# Patient Record
Sex: Female | Born: 1970 | Race: Black or African American | Hispanic: No | Marital: Married | Smoking: Never smoker
Health system: Southern US, Community
[De-identification: ages and names within clinical notes are randomized; demographics above are authoritative.]

## PROBLEM LIST (undated history)

## (undated) ENCOUNTER — Ambulatory Visit: Admission: EM | Payer: BC Managed Care – PPO | Source: Home / Self Care

## (undated) DIAGNOSIS — E559 Vitamin D deficiency, unspecified: Secondary | ICD-10-CM

## (undated) DIAGNOSIS — D631 Anemia in chronic kidney disease: Secondary | ICD-10-CM

## (undated) DIAGNOSIS — N189 Chronic kidney disease, unspecified: Secondary | ICD-10-CM

## (undated) DIAGNOSIS — Z803 Family history of malignant neoplasm of breast: Secondary | ICD-10-CM

## (undated) DIAGNOSIS — Z8041 Family history of malignant neoplasm of ovary: Secondary | ICD-10-CM

## (undated) DIAGNOSIS — G629 Polyneuropathy, unspecified: Secondary | ICD-10-CM

## (undated) DIAGNOSIS — R002 Palpitations: Secondary | ICD-10-CM

## (undated) DIAGNOSIS — R7401 Elevation of levels of liver transaminase levels: Secondary | ICD-10-CM

## (undated) DIAGNOSIS — I10 Essential (primary) hypertension: Secondary | ICD-10-CM

## (undated) DIAGNOSIS — Z800 Family history of malignant neoplasm of digestive organs: Secondary | ICD-10-CM

## (undated) DIAGNOSIS — M1990 Unspecified osteoarthritis, unspecified site: Secondary | ICD-10-CM

## (undated) DIAGNOSIS — N184 Chronic kidney disease, stage 4 (severe): Secondary | ICD-10-CM

## (undated) DIAGNOSIS — K5900 Constipation, unspecified: Secondary | ICD-10-CM

## (undated) DIAGNOSIS — G4730 Sleep apnea, unspecified: Secondary | ICD-10-CM

## (undated) DIAGNOSIS — K219 Gastro-esophageal reflux disease without esophagitis: Secondary | ICD-10-CM

## (undated) DIAGNOSIS — G4733 Obstructive sleep apnea (adult) (pediatric): Secondary | ICD-10-CM

## (undated) DIAGNOSIS — R079 Chest pain, unspecified: Secondary | ICD-10-CM

## (undated) DIAGNOSIS — M65311 Trigger thumb, right thumb: Secondary | ICD-10-CM

## (undated) DIAGNOSIS — M549 Dorsalgia, unspecified: Secondary | ICD-10-CM

## (undated) DIAGNOSIS — E119 Type 2 diabetes mellitus without complications: Secondary | ICD-10-CM

## (undated) DIAGNOSIS — R600 Localized edema: Secondary | ICD-10-CM

## (undated) DIAGNOSIS — Z9889 Other specified postprocedural states: Secondary | ICD-10-CM

## (undated) DIAGNOSIS — J189 Pneumonia, unspecified organism: Secondary | ICD-10-CM

## (undated) DIAGNOSIS — M2550 Pain in unspecified joint: Secondary | ICD-10-CM

## (undated) DIAGNOSIS — K429 Umbilical hernia without obstruction or gangrene: Secondary | ICD-10-CM

## (undated) DIAGNOSIS — I509 Heart failure, unspecified: Secondary | ICD-10-CM

## (undated) DIAGNOSIS — Z992 Dependence on renal dialysis: Secondary | ICD-10-CM

## (undated) DIAGNOSIS — K760 Fatty (change of) liver, not elsewhere classified: Secondary | ICD-10-CM

## (undated) DIAGNOSIS — R0602 Shortness of breath: Secondary | ICD-10-CM

## (undated) DIAGNOSIS — M1009 Idiopathic gout, multiple sites: Secondary | ICD-10-CM

## (undated) DIAGNOSIS — Z794 Long term (current) use of insulin: Secondary | ICD-10-CM

## (undated) DIAGNOSIS — E11319 Type 2 diabetes mellitus with unspecified diabetic retinopathy without macular edema: Secondary | ICD-10-CM

## (undated) DIAGNOSIS — R87610 Atypical squamous cells of undetermined significance on cytologic smear of cervix (ASC-US): Secondary | ICD-10-CM

## (undated) DIAGNOSIS — E782 Mixed hyperlipidemia: Secondary | ICD-10-CM

## (undated) DIAGNOSIS — R740 Nonspecific elevation of levels of transaminase and lactic acid dehydrogenase [LDH]: Secondary | ICD-10-CM

## (undated) HISTORY — DX: Dorsalgia, unspecified: M54.9

## (undated) HISTORY — DX: Family history of malignant neoplasm of ovary: Z80.41

## (undated) HISTORY — DX: Dependence on renal dialysis: Z99.2

## (undated) HISTORY — PX: DILATION AND CURETTAGE OF UTERUS: SHX78

## (undated) HISTORY — DX: Localized edema: R60.0

## (undated) HISTORY — PX: ABDOMINAL HYSTERECTOMY: SHX81

## (undated) HISTORY — DX: Essential (primary) hypertension: I10

## (undated) HISTORY — DX: Palpitations: R00.2

## (undated) HISTORY — DX: Vitamin D deficiency, unspecified: E55.9

## (undated) HISTORY — DX: Pain in unspecified joint: M25.50

## (undated) HISTORY — DX: Family history of malignant neoplasm of digestive organs: Z80.0

## (undated) HISTORY — PX: CARPAL TUNNEL RELEASE: SHX101

## (undated) HISTORY — PX: OTHER SURGICAL HISTORY: SHX169

## (undated) HISTORY — DX: Family history of malignant neoplasm of breast: Z80.3

## (undated) HISTORY — DX: Heart failure, unspecified: I50.9

## (undated) HISTORY — DX: Chest pain, unspecified: R07.9

## (undated) HISTORY — DX: Constipation, unspecified: K59.00

## (undated) HISTORY — DX: Unspecified osteoarthritis, unspecified site: M19.90

## (undated) HISTORY — DX: Gastro-esophageal reflux disease without esophagitis: K21.9

## (undated) HISTORY — DX: Atypical squamous cells of undetermined significance on cytologic smear of cervix (ASC-US): R87.610

## (undated) HISTORY — DX: Shortness of breath: R06.02

## (undated) HISTORY — PX: BREAST EXCISIONAL BIOPSY: SUR124

---

## 1992-05-17 HISTORY — PX: PELVIC LAPAROSCOPY: SHX162

## 1997-05-21 ENCOUNTER — Encounter: Admission: RE | Admit: 1997-05-21 | Discharge: 1997-05-21 | Payer: Self-pay | Admitting: Obstetrics & Gynecology

## 1998-05-02 ENCOUNTER — Encounter: Payer: Self-pay | Admitting: *Deleted

## 1998-05-02 ENCOUNTER — Inpatient Hospital Stay (HOSPITAL_COMMUNITY): Admission: AD | Admit: 1998-05-02 | Discharge: 1998-05-02 | Payer: Self-pay | Admitting: Family Medicine

## 1998-05-19 ENCOUNTER — Ambulatory Visit (HOSPITAL_COMMUNITY): Admission: RE | Admit: 1998-05-19 | Discharge: 1998-05-19 | Payer: Self-pay | Admitting: Obstetrics & Gynecology

## 1998-05-27 ENCOUNTER — Inpatient Hospital Stay (HOSPITAL_COMMUNITY): Admission: AD | Admit: 1998-05-27 | Discharge: 1998-05-27 | Payer: Self-pay | Admitting: *Deleted

## 1998-05-27 ENCOUNTER — Encounter: Payer: Self-pay | Admitting: *Deleted

## 1998-05-28 ENCOUNTER — Encounter: Admission: RE | Admit: 1998-05-28 | Discharge: 1998-05-28 | Payer: Self-pay | Admitting: Obstetrics & Gynecology

## 1998-05-28 ENCOUNTER — Encounter: Admission: RE | Admit: 1998-05-28 | Discharge: 1998-08-26 | Payer: Self-pay | Admitting: Obstetrics & Gynecology

## 1998-06-18 ENCOUNTER — Encounter: Admission: RE | Admit: 1998-06-18 | Discharge: 1998-06-18 | Payer: Self-pay | Admitting: Obstetrics & Gynecology

## 1998-06-25 ENCOUNTER — Encounter: Admission: RE | Admit: 1998-06-25 | Discharge: 1998-06-25 | Payer: Self-pay | Admitting: Obstetrics & Gynecology

## 1998-07-02 ENCOUNTER — Encounter: Admission: RE | Admit: 1998-07-02 | Discharge: 1998-07-02 | Payer: Self-pay | Admitting: Obstetrics & Gynecology

## 1998-07-02 ENCOUNTER — Encounter: Payer: Self-pay | Admitting: Obstetrics & Gynecology

## 1998-07-02 ENCOUNTER — Ambulatory Visit (HOSPITAL_COMMUNITY): Admission: RE | Admit: 1998-07-02 | Discharge: 1998-07-02 | Payer: Self-pay | Admitting: Obstetrics & Gynecology

## 1998-07-06 ENCOUNTER — Inpatient Hospital Stay (HOSPITAL_COMMUNITY): Admission: AD | Admit: 1998-07-06 | Discharge: 1998-07-06 | Payer: Self-pay | Admitting: Obstetrics & Gynecology

## 1998-07-07 ENCOUNTER — Ambulatory Visit (HOSPITAL_COMMUNITY): Admission: AD | Admit: 1998-07-07 | Discharge: 1998-07-07 | Payer: Self-pay | Admitting: *Deleted

## 1998-08-29 ENCOUNTER — Encounter: Admission: RE | Admit: 1998-08-29 | Discharge: 1998-08-29 | Payer: Self-pay | Admitting: Obstetrics & Gynecology

## 1999-02-26 ENCOUNTER — Encounter: Admission: RE | Admit: 1999-02-26 | Discharge: 1999-05-27 | Payer: Self-pay | Admitting: Gynecology

## 1999-03-03 ENCOUNTER — Encounter (HOSPITAL_COMMUNITY): Admission: RE | Admit: 1999-03-03 | Discharge: 1999-06-01 | Payer: Self-pay | Admitting: *Deleted

## 1999-03-09 ENCOUNTER — Inpatient Hospital Stay (HOSPITAL_COMMUNITY): Admission: AD | Admit: 1999-03-09 | Discharge: 1999-03-09 | Payer: Self-pay | Admitting: Obstetrics & Gynecology

## 1999-03-09 ENCOUNTER — Encounter: Payer: Self-pay | Admitting: *Deleted

## 1999-03-11 ENCOUNTER — Inpatient Hospital Stay (HOSPITAL_COMMUNITY): Admission: AD | Admit: 1999-03-11 | Discharge: 1999-03-11 | Payer: Self-pay | Admitting: Obstetrics

## 1999-03-19 ENCOUNTER — Inpatient Hospital Stay (HOSPITAL_COMMUNITY): Admission: AD | Admit: 1999-03-19 | Discharge: 1999-03-19 | Payer: Self-pay | Admitting: Obstetrics & Gynecology

## 1999-03-19 ENCOUNTER — Encounter: Payer: Self-pay | Admitting: *Deleted

## 1999-04-07 ENCOUNTER — Encounter: Payer: Self-pay | Admitting: *Deleted

## 1999-04-07 ENCOUNTER — Inpatient Hospital Stay (HOSPITAL_COMMUNITY): Admission: AD | Admit: 1999-04-07 | Discharge: 1999-04-07 | Payer: Self-pay | Admitting: *Deleted

## 1999-04-16 ENCOUNTER — Encounter: Payer: Self-pay | Admitting: *Deleted

## 1999-04-16 ENCOUNTER — Observation Stay (HOSPITAL_COMMUNITY): Admission: RE | Admit: 1999-04-16 | Discharge: 1999-04-17 | Payer: Self-pay | Admitting: *Deleted

## 1999-05-21 ENCOUNTER — Encounter: Admission: RE | Admit: 1999-05-21 | Discharge: 1999-06-03 | Payer: Self-pay | Admitting: *Deleted

## 1999-06-02 ENCOUNTER — Encounter (HOSPITAL_COMMUNITY): Admission: RE | Admit: 1999-06-02 | Discharge: 1999-08-31 | Payer: Self-pay | Admitting: *Deleted

## 1999-06-09 ENCOUNTER — Encounter: Payer: Self-pay | Admitting: *Deleted

## 1999-08-04 ENCOUNTER — Inpatient Hospital Stay (HOSPITAL_COMMUNITY): Admission: AD | Admit: 1999-08-04 | Discharge: 1999-08-04 | Payer: Self-pay | Admitting: *Deleted

## 1999-08-18 ENCOUNTER — Encounter: Payer: Self-pay | Admitting: *Deleted

## 1999-09-04 ENCOUNTER — Encounter (HOSPITAL_COMMUNITY): Admission: RE | Admit: 1999-09-04 | Discharge: 1999-09-25 | Payer: Self-pay | Admitting: *Deleted

## 1999-09-08 ENCOUNTER — Encounter: Payer: Self-pay | Admitting: *Deleted

## 1999-09-22 ENCOUNTER — Inpatient Hospital Stay (HOSPITAL_COMMUNITY): Admission: AD | Admit: 1999-09-22 | Discharge: 1999-09-27 | Payer: Self-pay | Admitting: *Deleted

## 1999-09-22 ENCOUNTER — Encounter: Payer: Self-pay | Admitting: *Deleted

## 1999-09-22 ENCOUNTER — Encounter (INDEPENDENT_AMBULATORY_CARE_PROVIDER_SITE_OTHER): Payer: Self-pay

## 1999-10-06 ENCOUNTER — Inpatient Hospital Stay (HOSPITAL_COMMUNITY): Admission: AD | Admit: 1999-10-06 | Discharge: 1999-10-06 | Payer: Self-pay | Admitting: *Deleted

## 1999-11-03 ENCOUNTER — Inpatient Hospital Stay (HOSPITAL_COMMUNITY): Admission: EM | Admit: 1999-11-03 | Discharge: 1999-11-03 | Payer: Self-pay | Admitting: *Deleted

## 1999-11-03 ENCOUNTER — Encounter (INDEPENDENT_AMBULATORY_CARE_PROVIDER_SITE_OTHER): Payer: Self-pay

## 2000-06-13 ENCOUNTER — Encounter: Admission: RE | Admit: 2000-06-13 | Discharge: 2000-09-11 | Payer: Self-pay | Admitting: Family Medicine

## 2000-07-04 ENCOUNTER — Encounter: Payer: Self-pay | Admitting: Obstetrics and Gynecology

## 2000-07-11 ENCOUNTER — Encounter (INDEPENDENT_AMBULATORY_CARE_PROVIDER_SITE_OTHER): Payer: Self-pay | Admitting: Specialist

## 2000-07-11 ENCOUNTER — Inpatient Hospital Stay (HOSPITAL_COMMUNITY): Admission: RE | Admit: 2000-07-11 | Discharge: 2000-07-14 | Payer: Self-pay | Admitting: Obstetrics and Gynecology

## 2000-11-29 ENCOUNTER — Encounter: Admission: RE | Admit: 2000-11-29 | Discharge: 2000-12-05 | Payer: Self-pay | Admitting: Orthopedic Surgery

## 2001-08-30 ENCOUNTER — Other Ambulatory Visit: Admission: RE | Admit: 2001-08-30 | Discharge: 2001-08-30 | Payer: Self-pay | Admitting: Obstetrics and Gynecology

## 2001-09-08 ENCOUNTER — Ambulatory Visit (HOSPITAL_COMMUNITY): Admission: RE | Admit: 2001-09-08 | Discharge: 2001-09-08 | Payer: Self-pay | Admitting: Obstetrics and Gynecology

## 2001-09-08 ENCOUNTER — Encounter: Payer: Self-pay | Admitting: Obstetrics and Gynecology

## 2001-11-15 ENCOUNTER — Encounter: Admission: RE | Admit: 2001-11-15 | Discharge: 2002-02-13 | Payer: Self-pay | Admitting: Internal Medicine

## 2002-03-16 ENCOUNTER — Emergency Department (HOSPITAL_COMMUNITY): Admission: EM | Admit: 2002-03-16 | Discharge: 2002-03-17 | Payer: Self-pay | Admitting: Emergency Medicine

## 2002-03-23 ENCOUNTER — Encounter: Payer: Self-pay | Admitting: Gastroenterology

## 2002-03-23 ENCOUNTER — Encounter: Admission: RE | Admit: 2002-03-23 | Discharge: 2002-03-23 | Payer: Self-pay | Admitting: Gastroenterology

## 2002-04-01 ENCOUNTER — Encounter: Payer: Self-pay | Admitting: Emergency Medicine

## 2002-04-01 ENCOUNTER — Emergency Department (HOSPITAL_COMMUNITY): Admission: EM | Admit: 2002-04-01 | Discharge: 2002-04-01 | Payer: Self-pay | Admitting: Emergency Medicine

## 2002-06-15 ENCOUNTER — Emergency Department (HOSPITAL_COMMUNITY): Admission: EM | Admit: 2002-06-15 | Discharge: 2002-06-16 | Payer: Self-pay | Admitting: Emergency Medicine

## 2002-06-26 ENCOUNTER — Encounter: Admission: RE | Admit: 2002-06-26 | Discharge: 2002-06-26 | Payer: Self-pay | Admitting: Sports Medicine

## 2003-10-28 ENCOUNTER — Other Ambulatory Visit: Admission: RE | Admit: 2003-10-28 | Discharge: 2003-10-28 | Payer: Self-pay | Admitting: Obstetrics and Gynecology

## 2004-11-03 ENCOUNTER — Other Ambulatory Visit: Admission: RE | Admit: 2004-11-03 | Discharge: 2004-11-03 | Payer: Self-pay | Admitting: Obstetrics and Gynecology

## 2005-11-12 ENCOUNTER — Other Ambulatory Visit: Admission: RE | Admit: 2005-11-12 | Discharge: 2005-11-12 | Payer: Self-pay | Admitting: Obstetrics and Gynecology

## 2007-08-22 ENCOUNTER — Other Ambulatory Visit: Admission: RE | Admit: 2007-08-22 | Discharge: 2007-08-22 | Payer: Self-pay | Admitting: Obstetrics and Gynecology

## 2008-06-20 ENCOUNTER — Ambulatory Visit: Payer: Self-pay | Admitting: Obstetrics and Gynecology

## 2009-04-18 ENCOUNTER — Encounter: Admission: RE | Admit: 2009-04-18 | Discharge: 2009-04-18 | Payer: Self-pay | Admitting: Internal Medicine

## 2009-04-22 ENCOUNTER — Ambulatory Visit: Payer: Self-pay | Admitting: Obstetrics and Gynecology

## 2009-07-21 ENCOUNTER — Emergency Department (HOSPITAL_COMMUNITY): Admission: EM | Admit: 2009-07-21 | Discharge: 2009-07-22 | Payer: Self-pay | Admitting: Emergency Medicine

## 2010-01-15 ENCOUNTER — Ambulatory Visit: Payer: Self-pay | Admitting: Obstetrics and Gynecology

## 2010-03-13 ENCOUNTER — Encounter: Admission: RE | Admit: 2010-03-13 | Discharge: 2010-03-13 | Payer: Self-pay | Admitting: Internal Medicine

## 2010-04-24 ENCOUNTER — Ambulatory Visit: Payer: Self-pay | Admitting: Obstetrics and Gynecology

## 2010-04-24 ENCOUNTER — Other Ambulatory Visit
Admission: RE | Admit: 2010-04-24 | Discharge: 2010-04-24 | Payer: Self-pay | Source: Home / Self Care | Admitting: Obstetrics and Gynecology

## 2010-05-27 ENCOUNTER — Emergency Department (HOSPITAL_COMMUNITY)
Admission: EM | Admit: 2010-05-27 | Discharge: 2010-05-27 | Payer: Self-pay | Source: Home / Self Care | Admitting: Family Medicine

## 2010-10-02 NOTE — H&P (Signed)
[facility]  Patient:    Cassandra Campos, Cassandra Campos                      MRN: [id number] Attending:  Cherly Anderson. Cherylann Banas, M.D.                         History and Physical  CHIEF COMPLAINT:  Persistent menorrhagia.  HISTORY OF PRESENT ILLNESS:  The patient is a 40 year old gravida 5, para 2, AB 3, who has had persistent severe menorrhagia which has been uncontrollable for the patient. She is status post two cesarean section and a tubal ligation. We have tried oral contraceptives and additional progestins, and this has not controlled the menorrhagia. In addition, she has undergone sonohysterogram which was consistent with severe adenomyosis but without any intrauterine pathology. As a result of the above findings and her inability to live with the severe menorrhagia and the failure to respond to birth control pills, she now enters the hospital for total abdominal hysterectomy for what is felt to be adenomyosis.  PAST MEDICAL HISTORY:  The patient is insulin-senstive diabetic.  ALLERGIES:  None.  FAMILY HISTORY:  Noncontributory.  SOCIAL HISTORY:  She is a nonsmoker, nondrinker.  REVIEW OF SYSTEMS:  HEENT:  Reveals a history of headaches. CARDIAC:  Reveals a history of hypertension. GI:  Reveals a history of GERD. GU:  Negative. NEUROLOGICAL/PSYCHIATRIC:  Reveals a history of anxiety and depression. ENDOCRINE:  Reveals a history insulin-dependent diabetes.  PHYSICAL EXAMINATION:  GENERAL:  The patient is a well-developed, well-nourished female in no acute distress.  VITAL SIGNS:  Blood pressure is 100/60, pulse is 80 and regular, respirations 16 and nonlabored, she is afebrile.  HEENT:  All within normal limits.  NECK:  Supple. Trachea in the midline. Thyroid is not enlarged.  LUNGS:  Clear to P&A.  HEART:  No thrills, heaves, or murmurs.  BREASTS:  No masses.  ABDOMEN:  Soft, without guarding, rebound, or masses.  PELVIC:  External and vaginal  within normal limits. Cervix shows no atypia on Pap smear and is clean. Uterus is boggy and enlarged to 7-8 week size consistent with either adenomyosis or small fibroids. Adnexa are palpably normal.  RECTAL:  Negative.  ADMISSION IMPRESSION:  Persistent menorrhagia, nonresponsive to medical therapy with sonohysterogram consistent with adenomyosis.  PLAN:  Total abdominal hysterectomy for treatment of the above.DD:  07/11/00 TD:  07/12/00 Job: [id number]

## 2010-10-02 NOTE — Op Note (Signed)
[facility]  Patient:    Cassandra Campos, Cassandra Campos                    MRN: [id number] Proc. Date: 09/25/99 Adm. Date:  [id number] Disc. Date: [id number] Attending:  Edmonia Caprio                           Operative Report  PREOPERATIVE DIAGNOSES:       1. A 33-week intrauterine pregnancy.                               2. Preterm premature rupture of membranes.                               3. Preterm labor.                               4. Multiparity, desires sterilization.  POSTOPERATIVE DIAGNOSES:      1. A 33-week intrauterine pregnancy.                               2. Preterm premature rupture of membranes.                               3. Preterm labor.                               4. Multiparity, desires sterilization.  PROCEDURES:                   1. Secondary cesarean delivery.                               2. Bilateral tubal ligation with modified                                  Pomeroy method.                               3. Cerclage removal.  SURGEON:                      Agnes Lawrence, M.D.  ANESTHESIA:                   Epidural anesthesia.  ESTIMATED BLOOD LOSS:         800 cc.  COMPLICATIONS:                None.  INDICATION FOR PROCEDURE:     The patient is a 40 year old para 1 at 63 weeks with preterm labor, preterm rupture of membranes with cerclage in situ.  DESCRIPTION OF PROCEDURE:     The patient was taken to the operating room.  An epidural anesthetic was administered without difficulty. She was then prepped and draped in the usual sterile fashion in the dorsal lithotomy position with a leftward tilt.  A Pfannenstiel skin incision was then made with  the scalpel through the previous scar and carried through to the underlying layer of fascia.  The fascia was incised in the midline and the incision extended laterally with Mayo scissors.  The superior aspect of the fascial incision was then grasped with  Kocher clamps and elevated and the underlying rectus muscles dissected off sharply.  Then she was then turned to the inferior aspect of this incision which was manipulated in similar fashion.  The rectus muscles were separated in the midline.  The peritoneum identified, tented up and entered sharply with Metzenbaum scissors.  Peritoneal incision was then extended superiorly and inferiorly with good visualization of the bladder. The bladder blade was then inserted and the vesicouterine peritoneum identified, grasped with pickups and entered sharply with Metzenbaum scissors.  This incision was then extended laterally and the bladder flap created sharply. The bladder blade was then reinserted and the lower uterine segment incised in transverse fashion with the scalpel. The uterine incision was then extended laterally with bandage scissors.  The bladder blade was removed and the infants head delivered atraumatically.  The nose and mouth were suctioned with bulb suction and the cord clamped and cut.  The infant was handed off to the waiting pediatricians.  The placenta was then removed.  The uterus exteriorized and cleared of all clots and debris.  The uterine incision was then repaired with 0 Monocryl in a running locked fashion.  Excellent hemostasis was noted.  Attention was then turned to the patients fallopian tubes.  The left fallopian tube was identified.  The Babcock clamp was then used to grasp the tube approximately 4 cm from the corneal region.  A 2 to 3 cm segment of the tube was then doubly ligated with three types of plain gut and excised.  Excellent hemostasis was noted.  The right fallopian tube was then manipulated in a similar fashion.  The uterus was returned to the abdomen.  The gutters were cleared of all clots. The fascia was reapproximated with 0 PDS in a running fashion.  The skin was closed with staples.  The patient was then, after the drapes had been removed, placed in  the dorsal lithotomy position and the cerclage was then severed and removed.  The patient tolerated the above procedures well.  Sponge, lap and needle counts correct x 2.  The patient was taken to the PACU in stable condition. DD:  01/01/00 TD:  01/04/00 Job: [id number]

## 2010-10-02 NOTE — Consult Note (Signed)
[facility]  Patient:    Cassandra Campos, Cassandra Campos                 MRN: [id number] Proc. Date: 11/29/00 Adm. Date:  [id number] Disc. Date: [id number] Attending:  Newt Minion CC:         Elyn Peers, M.D.   Consultation Report  HISTORY OF PRESENT ILLNESS:  [name] is a 40 year old woman who is a type 2 diabetic, who reports that her most recent CBG was 192, with most recent hemoglobin A1c of 7.8, who presents complaining of pain of her right great toe.  PRIMARY CARE PHYSICIAN:  Dr. Elyn Peers.  PAST MEDICAL HISTORY:  Height 5 feet 2 inches.  Weight 237 pounds. Significant diagnoses include type 2 diabetes, hypertension and high cholesterol.  MEDICATIONS:  Lotensin, Tricor, insulin.  ALLERGIES:  No known drug allergies.  PROCEDURES:  Exploratory laparoscopy, C-section x 2, hysterectomy and carpal tunnel release bilaterally.  EXAMINATION:  EXTREMITIES:  On examination of both lower extremities, she has thickening of the nails with onychomycosis.  She has callus over the lateral border of the right foot.  She has good palpable pulses.  She has protective sensation and can feel a 5.07 Semmes-Weinstein monofilament.  She has ingrown border of the right great toenail.  ASSESSMENT: 1. Diabetic neuropathy. 2. Ingrown right great toenail.  PLAN:  It was demonstrated to the patient how to place cotton under the corner edge of the nails.  She was recommended to trim the nails straight across. She has no paronychia at this time.  She will follow up with nursing visit as needed. DD:  12/20/00 TD:  12/21/00 Job: [id number]

## 2010-10-02 NOTE — Discharge Summary (Signed)
[facility]  Patient:    Cassandra Campos, Cassandra Campos                    MRN: [id number] Adm. Date:  [id number] Disc. Date: [id number] Attending:  Edmonia Caprio Dictator:   Irena Reichmann, M.D.                           Discharge Summary  ADMITTING DIAGNOSES:          1. Term pregnancy.                               2. Prolonged rupture of membranes.                               3. Insulin-dependent diabetes.                               4. Preterm labor.  DISCHARGE DIAGNOSES:          1. Term pregnancy.                               2. Prolonged rupture of membranes.                               3. Insulin-dependent diabetes.                               4. Status post repeat low transverse cesarean                                  section and bilateral tubal ligation.                               5. Preterm labor.                               6. Cerclage removal.  PROCEDURES:                   1. Secondary low transverse cesarean section.                               2. Bilateral tubal ligation with modified                                  Pomeroy method.                               3. Cerclage removal.  HOSPITAL COURSE:              The patient presented with prolonged premature rupture of membranes at 33-2/7 weeks.  The patient was admitted and started on Unasyn and monitored closely on continuous monitoring.  Her membranes ruptured on Sep 19, 1999.  She had onset of labor on Sep 24, 1999.  The patient was brought to the operating room on Sep 25, 1999, and had a secondary low transverse cesarean section and bilateral tubal ligation as well as cerclage removal.  The patient tolerated the procedure well.  She had routine postoperative care.  She was discharged on postoperative day #3.  She was hemodynamically stable throughout.  CONDITION ON DISCHARGE:       Good.  DISPOSITION:                  Discharged to home.  DISCHARGE  MEDICATIONS:        1. Motrin 600 mg 1 p.o. t.i.d. p.r.n. pain with                                  food.                               2. Percocet 1-2 p.o. q.4-6h. p.r.n. severe pain.                               3. Prenatal vitamins 1 p.o. q.d.  FOLLOW-UP:                    The patient will follow up in MAU for wound check in 1-2 days.  She is bottle feeding.  She will then follow up in six weeks for routine postpartum visit. DD:  02/24/00 TD:  02/25/00 Job: [id number]

## 2010-10-02 NOTE — Op Note (Signed)
[facility]  Patient:    Cassandra Campos, Cassandra Campos                      MRN: [id number] Proc. Date: 07/11/00 Attending:  Cherly Anderson. Cherylann Banas, M.D.                           Operative Report  PREOPERATIVE DIAGNOSIS:  Persistent dysfunctional uterine bleeding with adenomyosis suspected.  POSTOPERATIVE DIAGNOSIS:  Persistent dysfunctional uterine bleeding with adenomyosis suspected.  OPERATIVE PROCEDURE:  Total abdominal hysterectomy.  SURGEON:  Daniel L. Cherylann Banas, M.D.  ASSISTANT:  Elveria Rising, M.D.  ANESTHESIA:  General endotracheal.  INTRAOPERATIVE FINDINGS:  The patients uterus was boggy and enlarged, consistent either with small myoma or adenomyosis. The ovaries and fallopian tubes were free of any disease. There was some fairly significant scarring at the site of her previous cesarean section at the vesicouterine fold peritoneum.  DESCRIPTION OF PROCEDURE:  After adequate general endotracheal anesthesia, the patient was placed in the supine position and prepped and draped in the usual sterile manner. A Foley catheter was inserted into the patients bladder.  A Pfannenstiel incision was made, the fascia was opened transversely and the peritoneum was entered vertically. Significant scarring was found at this point. Once the peritoneal cavity was opened, the above findings were noted. The bowel was packed away. The round ligaments were clamped, cut and suture ligated with #1 chromic catgut. The vesicouterine peritoneum and posterior peritoneum were sharply dissected free. The utero-ovarian ligaments and fallopian tubes were clamped, cut and suture ligated, leaving the adnexa in place as the patient is only 30 and she had normal ovaries. They were doubly ligated with #1 chromic catgut. With some difficulty, the bladder flap was advanced sharply to get it below the cervicovaginal junction. The uterine arteries were clamped, cut and doubly suture ligated  with #1 chromic catgut. In order to remove the cervix more easily, a supracervical hysterectomy was done at this point and then the cervix could be removed. The parametrium was taken down in successive bites by clamping, cutting and suture ligating with #1 chromic catgut. The uterosacral ligaments were clamped, cut and suture ligated with #1 chromic catgut. The cervicovaginal junction was identified, entered with sharp dissection and the cervix was also removed and sent to pathology for tissue diagnosis. Angle sutures were placed in the angles of the vagina with #1 chromic catgut incorporating the uterosacral ligaments and the parametrium for good vault support. The vaginal cuff was whipstitched with a running locking #0 Vicryl. Copious irrigation was done with Ringers lactate. There was no bleeding noted.  Two sponge, needle, lap and instrument counts were correct. The peritoneum was not closed but the fascia was closed with two running #0 Vicryl and the skin was closed with staples. Estimated blood loss for the entire procedure was 500 cc with none replaced.  The patient tolerated the procedure well and left the operating room in satisfactory condition. DD:  07/11/00 TD:  07/12/00 Job: [id number]

## 2010-10-02 NOTE — Discharge Summary (Signed)
[facility]  Patient:    Cassandra Campos, Cassandra Campos                 MRN: [id number] Adm. Date:  [id number] Disc. Date: [id number] Attending:  Aram Beecham                           Discharge Summary  HOSPITAL COURSE:  The patient is a 40 year old female with refractory dysfunctional uterine bleeding and dysmenorrhea with adenomyosis and fibroids who entered the hospital for definitive surgery. On the day of admission, she was taken to the operating room and a total abdominal hysterectomy was performed. Postoperatively she ran an ileus. It responded to restricting her fluids and using suppositories. By the third postoperative day, she was passing gas and having stool. She was discharged on Tylox p.r.n. for pain relief, soft diet to advance as tolerated, and her preoperative diabetic medications. She is to return to my office in three weeks for follow-up. Laboratory data is in the chart at the time of dictation. Final pathology report revealed adenomyosis and leiomyomata.  DISCHARGE DIAGNOSES:  Leiomyomata, adenomyosis, dysfunctional uterine bleeding.  OPERATION PERFORMED:  Total abdominal hysterectomy.  CONDITION ON DISCHARGE:  Improved. DD:  07/14/00 TD:  07/14/00 Job: [id number]

## 2010-10-07 ENCOUNTER — Emergency Department (HOSPITAL_BASED_OUTPATIENT_CLINIC_OR_DEPARTMENT_OTHER)
Admission: EM | Admit: 2010-10-07 | Discharge: 2010-10-07 | Disposition: A | Discharge status: Home / Self Care | Payer: BC Managed Care – PPO | Attending: Emergency Medicine | Admitting: Emergency Medicine

## 2010-10-07 DIAGNOSIS — B3731 Acute candidiasis of vulva and vagina: Secondary | ICD-10-CM | POA: Insufficient documentation

## 2010-10-07 DIAGNOSIS — E7800 Pure hypercholesterolemia, unspecified: Secondary | ICD-10-CM | POA: Insufficient documentation

## 2010-10-07 DIAGNOSIS — I10 Essential (primary) hypertension: Secondary | ICD-10-CM | POA: Insufficient documentation

## 2010-10-07 DIAGNOSIS — B373 Candidiasis of vulva and vagina: Secondary | ICD-10-CM | POA: Insufficient documentation

## 2010-10-07 DIAGNOSIS — E119 Type 2 diabetes mellitus without complications: Secondary | ICD-10-CM | POA: Insufficient documentation

## 2010-10-07 DIAGNOSIS — M549 Dorsalgia, unspecified: Secondary | ICD-10-CM | POA: Insufficient documentation

## 2010-10-07 LAB — URINALYSIS, ROUTINE W REFLEX MICROSCOPIC
Bilirubin Urine: NEGATIVE
Glucose, UA: >1000 mg/dL — AB
Ketones, ur: 15 mg/dL — AB
Leukocytes, UA: NEGATIVE
Nitrite: NEGATIVE
Protein, ur: NEGATIVE mg/dL
Specific Gravity, Urine: 1.035 — ABNORMAL HIGH (ref 1.005–1.030)
Urobilinogen, UA: 0.2 mg/dL (ref 0.0–1.0)
pH: 5.5 (ref 5.0–8.0)

## 2010-10-07 LAB — URINE MICROSCOPIC-ADD ON

## 2011-05-05 DIAGNOSIS — E785 Hyperlipidemia, unspecified: Secondary | ICD-10-CM | POA: Insufficient documentation

## 2011-05-05 DIAGNOSIS — E781 Pure hyperglyceridemia: Secondary | ICD-10-CM | POA: Insufficient documentation

## 2011-05-05 DIAGNOSIS — E1122 Type 2 diabetes mellitus with diabetic chronic kidney disease: Secondary | ICD-10-CM | POA: Insufficient documentation

## 2011-05-05 DIAGNOSIS — N800 Endometriosis of uterus: Secondary | ICD-10-CM | POA: Insufficient documentation

## 2011-05-05 DIAGNOSIS — E119 Type 2 diabetes mellitus without complications: Secondary | ICD-10-CM | POA: Insufficient documentation

## 2011-05-05 DIAGNOSIS — N938 Other specified abnormal uterine and vaginal bleeding: Secondary | ICD-10-CM | POA: Insufficient documentation

## 2011-05-05 DIAGNOSIS — D219 Benign neoplasm of connective and other soft tissue, unspecified: Secondary | ICD-10-CM | POA: Insufficient documentation

## 2011-05-14 ENCOUNTER — Encounter: Payer: BC Managed Care – PPO | Admitting: Obstetrics and Gynecology

## 2011-05-18 ENCOUNTER — Emergency Department (INDEPENDENT_AMBULATORY_CARE_PROVIDER_SITE_OTHER): Payer: BC Managed Care – PPO

## 2011-05-18 ENCOUNTER — Encounter (HOSPITAL_COMMUNITY): Payer: Self-pay | Admitting: *Deleted

## 2011-05-18 ENCOUNTER — Emergency Department (INDEPENDENT_AMBULATORY_CARE_PROVIDER_SITE_OTHER)
Admission: EM | Admit: 2011-05-18 | Discharge: 2011-05-18 | Disposition: A | Discharge status: Home / Self Care | Payer: BC Managed Care – PPO | Source: Home / Self Care | Attending: Emergency Medicine | Admitting: Emergency Medicine

## 2011-05-18 DIAGNOSIS — R059 Cough, unspecified: Secondary | ICD-10-CM

## 2011-05-18 DIAGNOSIS — R05 Cough: Secondary | ICD-10-CM

## 2011-05-18 MED ORDER — PREDNISONE 20 MG PO TABS: 40.0000 mg | ORAL_TABLET | Freq: Every day | ORAL | Status: AC

## 2011-05-18 NOTE — ED Provider Notes (Addendum)
History     CSN: [id number]  Arrival date & time 05/18/11  1431   First MD Initiated Contact with Patient 05/18/11 1552      Chief Complaint  Patient presents with  . Cough  . Nasal Congestion  . Generalized Body Aches  . Sore Throat    (Consider location/radiation/quality/duration/timing/severity/associated sxs/prior treatment) Patient is a 41 y.o. female presenting with cough and pharyngitis. The history is provided by the patient.  Cough This is a recurrent (for almost 4 weeks) problem. The problem occurs constantly. The problem has been gradually worsening. The cough is non-productive. There has been no fever. Associated symptoms include ear congestion, rhinorrhea and sore throat. Pertinent negatives include no chest pain, no chills, no shortness of breath and no wheezing. The treatment provided no relief. She is not a smoker.  Sore Throat Pertinent negatives include no chest pain and no shortness of breath.    Past Medical History  Diagnosis Date  . DUB (dysfunctional uterine bleeding)   . Fibroids   . Adenomyosis   . Diabetes mellitus   . High triglycerides   . Hypertension     Past Surgical History  Procedure Date  . Abdominal hysterectomy FEB 2002    TAH  . Cesarean section     X 2  . Pelvic laparoscopy     Family History  Problem Relation Age of Onset  . Diabetes Mother   . Hypertension Mother   . Diabetes Father   . Hypertension Father   . Cancer Father     STOMACH    History  Substance Use Topics  . Smoking status: Unknown If Ever Smoked  . Smokeless tobacco: Not on file  . Alcohol Use: Yes    OB History    Grav Para Term Preterm Abortions TAB SAB Ect Mult Living   5 2   3     2       Review of Systems  Constitutional: Negative for chills.  HENT: Positive for sore throat and rhinorrhea.   Respiratory: Positive for cough. Negative for shortness of breath and wheezing.   Cardiovascular: Negative for chest pain.    Allergies  Review of  patient's allergies indicates no known allergies.  Home Medications   Current Outpatient Rx  Name Route Sig Dispense Refill  . FEXOFENADINE HCL 30 MG PO TABS Oral Take 30 mg by mouth 2 (two) times daily.      . INSULIN ASPART 100 UNIT/ML [place] SOLN Subcutaneous Inject 35 Units into the skin 3 (three) times daily before meals.      Marland Kitchen LEVEMIR FLEXPEN [place] Subcutaneous Inject 50 Units into the skin 1 day or 1 dose.     Marland Kitchen TRIBENZOR PO Oral Take by mouth.      . ACTOS PO Oral Take by mouth.      . CRESTOR PO Oral Take by mouth.      . BYSTOLIC PO Oral Take by mouth.      Marland Kitchen PREDNISONE 20 MG PO TABS Oral Take 2 tablets (40 mg total) by mouth daily. 10 tablet 0  . DIOVAN PO Oral Take by mouth.        BP 157/105  Pulse 116  Temp(Src) 98 F (36.7 C) (Oral)  Resp 19  SpO2 100%  Physical Exam  Nursing note and vitals reviewed. Constitutional: She appears well-developed and well-nourished. No distress.  HENT:  Head: Normocephalic.  Eyes: Conjunctivae are normal.  Neck: Normal range of motion. No JVD present.  Pulmonary/Chest:  Effort normal. No respiratory distress. She has decreased breath sounds. She has no wheezes. She has no rhonchi. She has no rales.  Lymphadenopathy:    She has no cervical adenopathy.    ED Course  Procedures (including critical care time)  Labs Reviewed - No data to display Dg Chest 2 View  05/18/2011  *RADIOLOGY REPORT*  Clinical Data: Cough.  Sick for 2 weeks.  Sinus drainage.  CHEST - 2 VIEW  Comparison: None.  Findings: Cardiomediastinal silhouette is within normal limits. There are no focal consolidations or pleural effusions.  No evidence for pulmonary edema. Visualized osseous structures have a normal appearance.  IMPRESSION: Negative exam.  Original Report Authenticated By: Glenice Bow, M.D.     1. Cough       MDM  X 2 weeks-had ILI now with reactive residual cough- afebrile        Rosana Hoes, MD 05/18/11 [place], MD 05/18/11  [phone]

## 2011-05-18 NOTE — ED Notes (Signed)
Onset of cough congestion sorethroat x 2 weeks mild body aches - pain with coughing

## 2011-05-28 ENCOUNTER — Telehealth (HOSPITAL_COMMUNITY): Payer: Self-pay | Admitting: *Deleted

## 2011-06-01 ENCOUNTER — Other Ambulatory Visit (HOSPITAL_COMMUNITY)
Admission: RE | Admit: 2011-06-01 | Discharge: 2011-06-01 | Disposition: A | Discharge status: Home / Self Care | Payer: Managed Care, Other (non HMO) | Source: Ambulatory Visit | Attending: Obstetrics and Gynecology | Admitting: Obstetrics and Gynecology

## 2011-06-01 ENCOUNTER — Ambulatory Visit (INDEPENDENT_AMBULATORY_CARE_PROVIDER_SITE_OTHER): Payer: Managed Care, Other (non HMO) | Admitting: Obstetrics and Gynecology

## 2011-06-01 ENCOUNTER — Encounter: Payer: Self-pay | Admitting: Obstetrics and Gynecology

## 2011-06-01 VITALS — BP 130/84 | Ht 63.5 in | Wt 219.0 lb

## 2011-06-01 DIAGNOSIS — R1032 Left lower quadrant pain: Secondary | ICD-10-CM

## 2011-06-01 DIAGNOSIS — Z01419 Encounter for gynecological examination (general) (routine) without abnormal findings: Secondary | ICD-10-CM

## 2011-06-01 LAB — URINALYSIS W MICROSCOPIC + REFLEX CULTURE
Bilirubin Urine: NEGATIVE
Casts: NONE SEEN
Crystals: NONE SEEN
Glucose, UA: 1000 mg/dL — AB
Ketones, ur: NEGATIVE mg/dL
Specific Gravity, Urine: 1.005 (ref 1.005–1.030)
Urobilinogen, UA: 0.2 mg/dL (ref 0.0–1.0)
WBC, UA: NONE SEEN WBC/hpf (ref <3)

## 2011-06-01 MED ORDER — FLUCONAZOLE 150 MG PO TABS: 150.0000 mg | ORAL_TABLET | Freq: Once | ORAL | Status: AC

## 2011-06-01 NOTE — Progress Notes (Signed)
The patient came to see me today for her annual GYN exam. For the past month she has had intermittent left lower quadrant pain. It is sharp. It is unassociated with GI or GU symptoms. It is generally daily. It seems to get worse with bending. She is due for a followup mammogram at 35. She will make an appointment. She is found that the vitamin E has helped her mastodynia when she stops it it gets worse. At one point she was also taking a baby aspirin for her PCP. She does her lab through her PCP. She is having no vaginal bleeding. She uses Diflucan for recurrent yeast. For a while she did for maintenance but now is doing when she is symptomatic.   HEENT: Within normal limits. Caryn Bee present Neck: No masses. Supraclavicular lymph nodes: Not enlarged. Breasts: Examined in both sitting and lying position. Symmetrical without skin changes or masses. Abdomen: Soft no masses guarding or rebound. No hernias. Pelvic: External within normal limits. BUS within normal limits. Vaginal examination shows good estrogen effect, no cystocele enterocele or rectocele. Cervix and uterus absent. Adnexa within normal limits. Rectovaginal confirmatory. Extremities within normal limits.  Assessment: #1. Mastodynia #2. Left lower quadrant pain #3. Recurrent yeast vaginitis  Plan: Mammogram. Refilled her Diflucan. Asked her to discuss with internist his feeling about her taking Vitamin E and aspirin together. Pelvic ultrasound scheduled.

## 2011-06-07 ENCOUNTER — Other Ambulatory Visit: Payer: Self-pay | Admitting: Obstetrics and Gynecology

## 2011-06-07 ENCOUNTER — Ambulatory Visit (INDEPENDENT_AMBULATORY_CARE_PROVIDER_SITE_OTHER): Payer: Managed Care, Other (non HMO) | Admitting: Obstetrics and Gynecology

## 2011-06-07 ENCOUNTER — Ambulatory Visit (INDEPENDENT_AMBULATORY_CARE_PROVIDER_SITE_OTHER): Payer: Managed Care, Other (non HMO)

## 2011-06-07 DIAGNOSIS — R1903 Right lower quadrant abdominal swelling, mass and lump: Secondary | ICD-10-CM

## 2011-06-07 DIAGNOSIS — D3910 Neoplasm of uncertain behavior of unspecified ovary: Secondary | ICD-10-CM

## 2011-06-07 DIAGNOSIS — R1032 Left lower quadrant pain: Secondary | ICD-10-CM

## 2011-06-07 DIAGNOSIS — D4959 Neoplasm of unspecified behavior of other genitourinary organ: Secondary | ICD-10-CM

## 2011-06-07 NOTE — Progress Notes (Signed)
Patient came to see me today because of a history of left lower quadrant pain. She also feels like her stomach getting larger. We went ahead and did an ultrasound. On ultrasound her uterus is surgically absent. Her right ovary has several masses. One is a 2.5 cm solid mass which is negative for PFD. She then has a larger echo-free mass of 12 x 7 x 11 cm with a 4 cm papillary solid mass inside of it which is positive for arterial blood flow. On her left ovary there is a small mass of 1.9 cm with a homogeneous internal level echoes which is the same as when we saw her in 2009. It is nonvascular. There is no cul-de-sac fluid present but there is a small mass at the top of the vaginal cuff of 1.2 cm which is vascular.  Assessment: Pelvic masses  Plan: CA 125 drawn. I told patient these need to be removed surgically. I think we should refer her to a GYN oncologist. She works in [place] and so I will refer her to [place]. I will wait till I see her lab results. I've explained to her that this could be a malignancy. Her mother had a borderline tumor of her ovary.

## 2011-06-14 ENCOUNTER — Other Ambulatory Visit: Payer: Self-pay | Admitting: *Deleted

## 2011-06-14 DIAGNOSIS — N838 Other noninflammatory disorders of ovary, fallopian tube and broad ligament: Secondary | ICD-10-CM

## 2011-06-22 ENCOUNTER — Other Ambulatory Visit: Payer: Self-pay | Admitting: Obstetrics and Gynecology

## 2011-06-22 DIAGNOSIS — Z1231 Encounter for screening mammogram for malignant neoplasm of breast: Secondary | ICD-10-CM

## 2011-06-24 ENCOUNTER — Ambulatory Visit
Admission: RE | Admit: 2011-06-24 | Discharge: 2011-06-24 | Disposition: A | Discharge status: Home / Self Care | Payer: Managed Care, Other (non HMO) | Source: Ambulatory Visit | Attending: Obstetrics and Gynecology | Admitting: Obstetrics and Gynecology

## 2011-06-24 DIAGNOSIS — Z1231 Encounter for screening mammogram for malignant neoplasm of breast: Secondary | ICD-10-CM

## 2011-06-24 DIAGNOSIS — N83209 Unspecified ovarian cyst, unspecified side: Secondary | ICD-10-CM | POA: Insufficient documentation

## 2011-07-19 DIAGNOSIS — R1900 Intra-abdominal and pelvic swelling, mass and lump, unspecified site: Secondary | ICD-10-CM | POA: Insufficient documentation

## 2012-01-23 ENCOUNTER — Emergency Department (HOSPITAL_COMMUNITY)
Admission: EM | Admit: 2012-01-23 | Discharge: 2012-01-23 | Disposition: A | Discharge status: Home / Self Care | Payer: Managed Care, Other (non HMO) | Source: Home / Self Care

## 2012-02-06 ENCOUNTER — Emergency Department (INDEPENDENT_AMBULATORY_CARE_PROVIDER_SITE_OTHER)
Admission: EM | Admit: 2012-02-06 | Discharge: 2012-02-06 | Disposition: A | Discharge status: Home / Self Care | Payer: Managed Care, Other (non HMO) | Source: Home / Self Care

## 2012-02-06 ENCOUNTER — Encounter (HOSPITAL_COMMUNITY): Payer: Self-pay | Admitting: *Deleted

## 2012-02-06 DIAGNOSIS — M5430 Sciatica, unspecified side: Secondary | ICD-10-CM

## 2012-02-06 MED ORDER — METHYLPREDNISOLONE 4 MG PO KIT: PACK | ORAL | Status: DC

## 2012-02-06 NOTE — ED Notes (Signed)
Denies injury.  States started with low back pain on 9/14; right thigh pain started 9/6.  Went to an urgent care facility on 9/8 - had Toradol IM and given Rx for Ultram.  States no relief from Ultram.  Has been taking leftover Vicodin and IBU from previous surgery and applying heat.  States right thigh "is more sensitive" with "maybe some tingling".  Patient has been out of HTN meds since Tuesday - has appt w/ PCP this week.

## 2012-02-06 NOTE — ED Provider Notes (Signed)
History     CSN: [id number]  Arrival date & time 02/06/12  0946   None     Chief Complaint  Patient presents with  . Back Pain  . Leg Pain    (Consider location/radiation/quality/duration/timing/severity/associated sxs/prior treatment) HPI Comments: 41 year old female who complains of back pain that radiates inferiorly to the right lateral thigh. The pain is a dull ache and is persistent throughout the day. SHe states her job requires her to sit for prolonged periods of time up to 8 hours a day. She had similar symptoms approximately one year ago. Spontaneously resolved reoccurred 2 days ago she has taken Ultram and ibuprofen without relief. She denies pain with bending stooping or pulling.   Patient is a 41 y.o. female presenting with back pain and leg pain.  Back Pain  Associated symptoms include leg pain. Pertinent negatives include no fever.  Leg Pain     Past Medical History  Diagnosis Date  . DUB (dysfunctional uterine bleeding)   . Fibroids   . Adenomyosis   . Diabetes mellitus   . High triglycerides   . Hypertension   . Hypercholesteremia     Past Surgical History  Procedure Date  . Abdominal hysterectomy FEB 2002    TAH  . Cesarean section     X 2  . Pelvic laparoscopy   . Carpal tunnel release     X 2    Family History  Problem Relation Age of Onset  . Diabetes Mother   . Hypertension Mother   . Diabetes Father   . Hypertension Father   . Cancer Father     STOMACH  . Stroke Maternal Grandmother   . Stroke Maternal Grandfather     History  Substance Use Topics  . Smoking status: Never Smoker   . Smokeless tobacco: Not on file  . Alcohol Use: 0.0 oz/week     occasional    OB History    Grav Para Term Preterm Abortions TAB SAB Ect Mult Living   5 2 2  3     2       Review of Systems  Constitutional: Negative for fever, chills and activity change.  HENT: Negative.   Respiratory: Negative.   Cardiovascular: Negative.     Musculoskeletal: Positive for back pain and joint swelling. Negative for arthralgias.       As per HPI  Skin: Negative for color change, pallor and rash.  Neurological: Negative.     Allergies  Review of patient's allergies indicates no known allergies.  Home Medications   Current Outpatient Rx  Name Route Sig Dispense Refill  . INSULIN ASPART 100 UNIT/ML [place] SOLN Subcutaneous Inject 35 Units into the skin 3 (three) times daily before meals.      Jabier Gauss PO Oral Take by mouth.      . CRESTOR PO Oral Take by mouth.      . TRAMADOL HCL 50 MG PO TABS Oral Take 50 mg by mouth every 6 (six) hours as needed.    Marland Kitchen FEXOFENADINE HCL 30 MG PO TABS Oral Take 30 mg by mouth 2 (two) times daily.      Marland Kitchen LEVEMIR FLEXPEN Perry Subcutaneous Inject 50 Units into the skin 1 day or 1 dose.     . METHYLPREDNISOLONE 4 MG PO KIT  As directed 21 tablet 0  . BYSTOLIC PO Oral Take by mouth.      . ACTOS PO Oral Take by mouth.      Marland Kitchen  DIOVAN PO Oral Take by mouth.        BP 165/101  Pulse 98  Temp 98 F (36.7 C) (Oral)  Resp 18  SpO2 100%  Physical Exam  Constitutional: She is oriented to person, place, and time. She appears well-developed and well-nourished. No distress.  Neck: Normal range of motion. Neck supple.  Musculoskeletal: She exhibits tenderness.       There is no tenderness in the lower back. Having her bend over at 90 angle does not reproduce back pain there is marked tenderness in the right upper mid buttock . Mild tenderness in the right lateral thigh.  Neurological: She is alert and oriented to person, place, and time. No cranial nerve deficit. Coordination normal.  Skin: Skin is warm and dry. No rash noted.    ED Course  Procedures (including critical care time)  Labs Reviewed - No data to display No results found.   1. Sciatica neuralgia       MDM  Medrol Dosepak as directed You may continue taking her Vicodin that she had at home. Instructions on exercises and  prevention for sciatica I also gave her instructions on increasing the dose of Humalog and Lantus by 2-5 units to control any elevations of blood sugar due to the Medrol.        Janne Napoleon, NP 02/06/12 [phone]

## 2012-02-06 NOTE — ED Provider Notes (Signed)
Medical screening examination/treatment/procedure(s) were performed by non-physician practitioner and as supervising physician I was immediately available for consultation/collaboration.  Burnett Kanaris, MD 02/06/12 1239

## 2012-04-25 ENCOUNTER — Telehealth: Payer: Self-pay | Admitting: *Deleted

## 2012-04-25 MED ORDER — ESTRADIOL 2 MG PO TABS: 2.0000 mg | ORAL_TABLET | Freq: Every day | ORAL | Status: DC

## 2012-04-25 NOTE — Telephone Encounter (Signed)
Pt informed with the below note, rx sent. 

## 2012-04-25 NOTE — Telephone Encounter (Signed)
I have reviewed her pathology report. All was benign. Start her on estradiol 2 mg daily.

## 2012-04-25 NOTE — Addendum Note (Signed)
Addended by: Thamas Jaegers on: 04/25/2012 02:45 PM   Modules accepted: Orders

## 2012-04-25 NOTE — Telephone Encounter (Signed)
I would need her pathology report before I could prescribe for her. The other option would be to have Dr. Netta Neat prescribe  for her.

## 2012-04-25 NOTE — Telephone Encounter (Signed)
Pt is calling c/o extreme hot flashes day and night sweats. She did see Dr.Samuels  in [place] and her masses were removed.Pt asked if Rx could be given. Please advise

## 2012-04-25 NOTE — Telephone Encounter (Signed)
Pt said that Dr.Lentz gave her estradiol 1 mg dose and didn't help any. Pathology report placed in office.

## 2012-04-25 NOTE — Telephone Encounter (Signed)
Spoke with pt regarding the below note, I called Dr. Rhodia Albright office and they will fax report.

## 2012-06-09 ENCOUNTER — Other Ambulatory Visit: Payer: Self-pay | Admitting: Internal Medicine

## 2012-06-09 DIAGNOSIS — Z1231 Encounter for screening mammogram for malignant neoplasm of breast: Secondary | ICD-10-CM

## 2012-06-26 ENCOUNTER — Ambulatory Visit: Payer: Managed Care, Other (non HMO)

## 2012-07-10 ENCOUNTER — Encounter: Payer: Self-pay | Admitting: Gynecology

## 2012-07-10 ENCOUNTER — Ambulatory Visit
Admission: RE | Admit: 2012-07-10 | Discharge: 2012-07-10 | Disposition: A | Discharge status: Home / Self Care | Payer: Managed Care, Other (non HMO) | Source: Ambulatory Visit | Attending: Internal Medicine | Admitting: Internal Medicine

## 2012-07-10 ENCOUNTER — Ambulatory Visit (INDEPENDENT_AMBULATORY_CARE_PROVIDER_SITE_OTHER): Payer: Managed Care, Other (non HMO) | Admitting: Gynecology

## 2012-07-10 VITALS — BP 136/80 | Ht 64.0 in | Wt 224.0 lb

## 2012-07-10 DIAGNOSIS — K469 Unspecified abdominal hernia without obstruction or gangrene: Secondary | ICD-10-CM

## 2012-07-10 DIAGNOSIS — N951 Menopausal and female climacteric states: Secondary | ICD-10-CM

## 2012-07-10 DIAGNOSIS — Z01419 Encounter for gynecological examination (general) (routine) without abnormal findings: Secondary | ICD-10-CM

## 2012-07-10 DIAGNOSIS — Z1231 Encounter for screening mammogram for malignant neoplasm of breast: Secondary | ICD-10-CM

## 2012-07-10 DIAGNOSIS — L293 Anogenital pruritus, unspecified: Secondary | ICD-10-CM

## 2012-07-10 DIAGNOSIS — N898 Other specified noninflammatory disorders of vagina: Secondary | ICD-10-CM

## 2012-07-10 DIAGNOSIS — Z113 Encounter for screening for infections with a predominantly sexual mode of transmission: Secondary | ICD-10-CM

## 2012-07-10 LAB — TSH: TSH: 1.533 u[IU]/mL (ref 0.350–4.500)

## 2012-07-10 LAB — WET PREP FOR TRICH, YEAST, CLUE: Trich, Wet Prep: NONE SEEN

## 2012-07-10 MED ORDER — FLUCONAZOLE 200 MG PO TABS: 200.0000 mg | ORAL_TABLET | Freq: Every day | ORAL | Status: DC

## 2012-07-10 MED ORDER — ESTRADIOL 2 MG PO TABS: 2.0000 mg | ORAL_TABLET | Freq: Every day | ORAL | Status: DC

## 2012-07-10 NOTE — Patient Instructions (Signed)
Follow up for lab results. Otherwise follow up in one year for annual exam.

## 2012-07-10 NOTE — Progress Notes (Signed)
Cassandra Campos 1970/11/09 [id number]        42 y.o.  [id number] for annual exam.  Former patient Cassandra Campos. Several issues noted below.  Past medical history,surgical history, medications, allergies, family history and social history were all reviewed and documented in the EPIC chart. ROS:  Was performed and pertinent positives and negatives are included in the history.  Exam: Cassandra Campos assistant Filed Vitals:   07/10/12 1557  BP: 136/80  Height: 5\' 4"  (1.626 m)  Weight: 224 lb (101.606 kg)   General appearance  Normal Skin grossly normal Head/Neck normal with no cervical or supraclavicular adenopathy thyroid normal Lungs  clear Cardiac RR, without RMG  Abdominal  soft, nontender, without masses, organomegaly.  Bulging upper abdomen mid to right side consistent with fascial weakness. No true hernial defect palpated.   Breasts  examined lying and sitting without masses, retractions, discharge or axillary adenopathy. Pelvic  Ext/BUS/vagina  normal   Adnexa  Without masses or tenderness    Anus and perineum  normal   Rectovaginal  normal sphincter tone without palpated masses or tenderness.    Assessment/Plan:  42 y.o. [id number] female for annual exam.   1. Vaginal itching times several days. Exam overall is normal in wet prep is negative. Will cover with Diflucan 200 mg x1 tablet with one extra to have available. She is being treated for diabetes. Follow up if symptoms persist or recur. 2. ERT. Patient status post TAH BSO for leiomyoma and bleeding. On 2 mg Estrace daily. Still having some hot flashes. We'll check estradiol level poor absorption and TSH. Otherwise will continue on 2 mg daily. I discussed ERT in the WHI study to include increased risk of stroke heart attack DVT possible breast cancer risk. Patient's comfortable continuing them remain on this at present. Benefits such as bone health and cardiovascular health reviewed. 3. Mammography today. We'll follow up for this. SBE  monthly reviewed. 4. Pap smear 05/2011. No Pap smear done today. No history of abnormal Pap smears. Options to stop screening altogether or less frequent screening interval reviewed. We'll readdress on annual basis. 5. STD screening. Patient has for STD screening. She has no known exposure but wants to be screened. HIV RPR hepatitis B hepatitis C and GC Chlamydia urine screen done. 6. Upper abdominal fascial weakness. No true hernia palpated but bulging consistent with a weakness. Discussed with patient. She is asymptomatic at this point I would recommend observation. Possible repair with mesh discussed with referral to Gen. Surgery if becomes symptomatic or enlarges. Will call me if any changes. 7. Health maintenance.  No other blood work done as it is all done through her primary physician's office who follows her for her medical issues. Follow up for lab results otherwise one year, sooner as needed.    Anastasio Auerbach MD, 4:45 PM 07/10/2012

## 2012-07-11 ENCOUNTER — Other Ambulatory Visit: Payer: Self-pay | Admitting: Gynecology

## 2012-07-11 LAB — ESTRADIOL: Estradiol: 22.3 pg/mL

## 2012-07-11 LAB — URINALYSIS W MICROSCOPIC + REFLEX CULTURE
Bacteria, UA: NONE SEEN
Casts: NONE SEEN
Leukocytes, UA: NEGATIVE
Nitrite: NEGATIVE
Specific Gravity, Urine: 1.017 (ref 1.005–1.030)
Urobilinogen, UA: 0.2 mg/dL (ref 0.0–1.0)
pH: 5.5 (ref 5.0–8.0)

## 2012-07-11 LAB — GC/CHLAMYDIA PROBE AMP
CT Probe RNA: NEGATIVE
GC Probe RNA: NEGATIVE

## 2012-07-11 LAB — HIV ANTIBODY (ROUTINE TESTING W REFLEX): HIV: NONREACTIVE

## 2012-07-11 LAB — HEPATITIS C ANTIBODY: HCV Ab: NEGATIVE

## 2012-07-11 MED ORDER — ESTRADIOL 0.1 MG/24HR TD PTTW: 1.0000 | MEDICATED_PATCH | TRANSDERMAL | Status: DC

## 2012-09-04 ENCOUNTER — Encounter: Payer: Self-pay | Admitting: *Deleted

## 2012-09-04 ENCOUNTER — Telehealth: Payer: Self-pay | Admitting: *Deleted

## 2012-09-04 NOTE — Telephone Encounter (Signed)
Okay to restart on her estradiol. I believe that she was taking 2 mg a day. If so we can refill her times of February 2015.

## 2012-09-04 NOTE — Telephone Encounter (Signed)
Pt is currently taking vivelle-dot patch giving on Jul 11 2012, told to call and follow up. Pt said she doesn't like patches having hard time keep them on, rotating on her body but the still don't stick good. She is requesting to start back on pills again? Please advise

## 2012-09-06 MED ORDER — ESTRADIOL 2 MG PO TABS: 2.0000 mg | ORAL_TABLET | Freq: Every day | ORAL | Status: DC

## 2012-09-06 NOTE — Telephone Encounter (Signed)
Left message on voicemail rx sent.

## 2012-10-10 ENCOUNTER — Emergency Department (INDEPENDENT_AMBULATORY_CARE_PROVIDER_SITE_OTHER)
Admission: EM | Admit: 2012-10-10 | Discharge: 2012-10-10 | Disposition: A | Discharge status: Home / Self Care | Payer: Managed Care, Other (non HMO) | Source: Home / Self Care | Attending: Emergency Medicine | Admitting: Emergency Medicine

## 2012-10-10 ENCOUNTER — Encounter (HOSPITAL_COMMUNITY): Payer: Self-pay | Admitting: Emergency Medicine

## 2012-10-10 DIAGNOSIS — M533 Sacrococcygeal disorders, not elsewhere classified: Secondary | ICD-10-CM

## 2012-10-10 DIAGNOSIS — M5431 Sciatica, right side: Secondary | ICD-10-CM

## 2012-10-10 DIAGNOSIS — M5430 Sciatica, unspecified side: Secondary | ICD-10-CM

## 2012-10-10 MED ORDER — TRAMADOL HCL 50 MG PO TABS: 50.0000 mg | ORAL_TABLET | Freq: Four times a day (QID) | ORAL | Status: DC | PRN

## 2012-10-10 MED ORDER — NAPROXEN 500 MG PO TABS: 500.0000 mg | ORAL_TABLET | Freq: Two times a day (BID) | ORAL | Status: DC

## 2012-10-10 NOTE — ED Notes (Signed)
Reports pain in right back radiating around hip to the side of right leg.  Patient reports she has had the same pain in left back/hip and leg and has been treated for sciatic pain .  Patient feels todays  Pain in right side of body is the same pain she has felt in left side of body.

## 2012-10-10 NOTE — ED Provider Notes (Signed)
Medical screening examination/treatment/procedure(s) were performed by non-physician practitioner and as supervising physician I was immediately available for consultation/collaboration.  Philipp Deputy, M.D.  Harden Mo, MD 10/10/12 [phone]

## 2012-10-10 NOTE — ED Notes (Signed)
Apologized to patient for delay, explained this nurse involved in transporting an emergency

## 2012-10-10 NOTE — ED Provider Notes (Signed)
History     CSN: [id number]  Arrival date & time 10/10/12  1045   First MD Initiated Contact with Patient 10/10/12 1239      Chief Complaint  Patient presents with  . Leg Pain    (Consider location/radiation/quality/duration/timing/severity/associated sxs/prior treatment) HPI Comments: Pt presents c/o pain in her back and leg on the right side.  She states this feels very similar to the sciatic/trochanteric bursitis pain she previously experienced on the left side.  This started on the 17th, 10 days ago.  She has pain primarily at night that she describes as a dull aching soreness that goes from her right lower back down the outside of her left to the mid calf.  This pain is fairly constant and has been keeping her up at night for the past few nights.  She also occasionally has a sharp pain that shoots from her lower back in the same distribution as the soreness.    Patient is a 42 y.o. female presenting with leg pain.  Leg Pain Associated symptoms: back pain   Associated symptoms: no fever     Past Medical History  Diagnosis Date  . Adenomyosis   . Diabetes mellitus   . High triglycerides   . Hypertension   . Hypercholesteremia     Past Surgical History  Procedure Laterality Date  . Cesarean section      X 2  . Pelvic laparoscopy    . Carpal tunnel release      X 2  . Oophorectomy      BSO  . Abdominal hysterectomy  FEB 2002    TAH/BSO for irregular bleeding and leiomyoma    Family History  Problem Relation Age of Onset  . Diabetes Mother   . Hypertension Mother   . Diabetes Father   . Hypertension Father   . Cancer Father     STOMACH  . Stroke Maternal Grandmother   . Stroke Maternal Grandfather     History  Substance Use Topics  . Smoking status: Never Smoker   . Smokeless tobacco: Not on file  . Alcohol Use: 0.0 oz/week     Comment: occasional    OB History   Grav Para Term Preterm Abortions TAB SAB Ect Mult Living   5 2 2  3     2        Review of Systems  Constitutional: Negative for fever and chills.  Eyes: Negative for visual disturbance.  Respiratory: Negative for cough and shortness of breath.   Cardiovascular: Negative for chest pain, palpitations and leg swelling.  Gastrointestinal: Negative for nausea, vomiting and abdominal pain.  Endocrine: Negative for polydipsia and polyuria.  Genitourinary: Negative for dysuria, urgency and frequency.  Musculoskeletal: Positive for back pain. Negative for myalgias and arthralgias.       See HPI  Skin: Negative for rash.  Neurological: Negative for dizziness, weakness and light-headedness.    Allergies  Review of patient's allergies indicates no known allergies.  Home Medications   Current Outpatient Rx  Name  Route  Sig  Dispense  Refill  . estradiol (ESTRACE) 2 MG tablet   Oral   Take 1 tablet (2 mg total) by mouth daily.   30 tablet   11   . fexofenadine (ALLEGRA) 30 MG tablet   Oral   Take 30 mg by mouth 2 (two) times daily.           . fluconazole (DIFLUCAN) 200 MG tablet   Oral   Take  1 tablet (200 mg total) by mouth daily. As needed for yeast   2 tablet   0   . gabapentin (NEURONTIN) 300 MG capsule   Oral   Take 300 mg by mouth 3 (three) times daily.         . insulin glargine (LANTUS) 100 UNIT/ML injection   Subcutaneous   Inject into the skin at bedtime.         . insulin glulisine (APIDRA) 100 UNIT/ML injection   Subcutaneous   Inject into the skin 3 (three) times daily before meals.         . naproxen (NAPROSYN) 500 MG tablet   Oral   Take 1 tablet (500 mg total) by mouth 2 (two) times daily.   60 tablet   0   . Olmesartan-Amlodipine-HCTZ (TRIBENZOR PO)   Oral   Take by mouth.           . Rosuvastatin Calcium (CRESTOR PO)   Oral   Take by mouth.           . traMADol (ULTRAM) 50 MG tablet   Oral   Take 1 tablet (50 mg total) by mouth every 6 (six) hours as needed for pain.   30 tablet   0     BP 151/90   Pulse 98  Temp(Src) 98.7 F (37.1 C) (Oral)  Resp 18  SpO2 95%  Physical Exam  Nursing note and vitals reviewed. Constitutional: She is oriented to person, place, and time. Vital signs are normal. She appears well-developed and well-nourished. No distress.  HENT:  Head: Atraumatic.  Eyes: EOM are normal. Pupils are equal, round, and reactive to light.  Cardiovascular: Normal rate, regular rhythm and normal heart sounds.  Exam reveals no gallop and no friction rub.   No murmur heard. Pulmonary/Chest: Effort normal and breath sounds normal. No respiratory distress. She has no wheezes. She has no rales.  Abdominal: Soft. There is no tenderness.  Musculoskeletal:       Lumbar back: She exhibits tenderness (over right gluteus).  TTP in right SI joint, also over IT band.  TTP is localized, shooting pain not reproducible   Neurological: She is alert and oriented to person, place, and time. She has normal strength.  Skin: Skin is warm and dry. She is not diaphoretic.  Psychiatric: She has a normal mood and affect. Her behavior is normal. Judgment normal.    ED Course  Procedures (including critical care time)  Labs Reviewed - No data to display No results found.   1. Sciatic nerve pain, right   2. Sacroiliac joint pain       MDM  Pt has SI joint pain along with sciatic or LFC nerve irritation.  Will treat her with antiinflammatories and tramadol and will give her exercises to complete at home.  If this is not helping within a couple of weeks she will f/u here or with PCP to discuss PT.   Meds ordered this encounter  Medications  . naproxen (NAPROSYN) 500 MG tablet    Sig: Take 1 tablet (500 mg total) by mouth 2 (two) times daily.    Dispense:  60 tablet    Refill:  0  . traMADol (ULTRAM) 50 MG tablet    Sig: Take 1 tablet (50 mg total) by mouth every 6 (six) hours as needed for pain.    Dispense:  30 tablet    Refill:  0           Alroy Dust  H Lanice Folden, PA-C 10/10/12  1310

## 2012-10-10 NOTE — ED Notes (Signed)
Patient is resting comfortably. 

## 2012-10-10 NOTE — ED Notes (Signed)
Instructed patient to put on gown for physician evaluation

## 2012-10-11 ENCOUNTER — Ambulatory Visit (INDEPENDENT_AMBULATORY_CARE_PROVIDER_SITE_OTHER): Payer: Managed Care, Other (non HMO) | Admitting: Women's Health

## 2012-10-11 ENCOUNTER — Encounter: Payer: Self-pay | Admitting: Women's Health

## 2012-10-11 DIAGNOSIS — N760 Acute vaginitis: Secondary | ICD-10-CM

## 2012-10-11 DIAGNOSIS — B9689 Other specified bacterial agents as the cause of diseases classified elsewhere: Secondary | ICD-10-CM

## 2012-10-11 DIAGNOSIS — N949 Unspecified condition associated with female genital organs and menstrual cycle: Secondary | ICD-10-CM

## 2012-10-11 DIAGNOSIS — B3731 Acute candidiasis of vulva and vagina: Secondary | ICD-10-CM

## 2012-10-11 DIAGNOSIS — N898 Other specified noninflammatory disorders of vagina: Secondary | ICD-10-CM

## 2012-10-11 DIAGNOSIS — A499 Bacterial infection, unspecified: Secondary | ICD-10-CM

## 2012-10-11 DIAGNOSIS — B373 Candidiasis of vulva and vagina: Secondary | ICD-10-CM

## 2012-10-11 LAB — WET PREP FOR TRICH, YEAST, CLUE: WBC, Wet Prep HPF POC: NONE SEEN

## 2012-10-11 MED ORDER — FLUCONAZOLE 150 MG PO TABS: 150.0000 mg | ORAL_TABLET | Freq: Once | ORAL | Status: DC

## 2012-10-11 MED ORDER — METRONIDAZOLE 0.75 % VA GEL: VAGINAL | Status: DC

## 2012-10-11 NOTE — Patient Instructions (Addendum)
Bacterial Vaginosis Bacterial vaginosis (BV) is a vaginal infection where the normal balance of bacteria in the vagina is disrupted. The normal balance is then replaced by an overgrowth of certain bacteria. There are several different kinds of bacteria that can cause BV. BV is the most common vaginal infection in women of childbearing age. CAUSES   The cause of BV is not fully understood. BV develops when there is an increase or imbalance of harmful bacteria.  Some activities or behaviors can upset the normal balance of bacteria in the vagina and put women at increased risk including:  Having a new sex partner or multiple sex partners.  Douching.  Using an intrauterine device (IUD) for contraception.  It is not clear what role sexual activity plays in the development of BV. However, women that have never had sexual intercourse are rarely infected with BV. Women do not get BV from toilet seats, bedding, swimming pools or from touching objects around them.  SYMPTOMS   Grey vaginal discharge.  A fish-like odor with discharge, especially after sexual intercourse.  Itching or burning of the vagina and vulva.  Burning or pain with urination.  Some women have no signs or symptoms at all. DIAGNOSIS  Your caregiver must examine the vagina for signs of BV. Your caregiver will perform lab tests and look at the sample of vaginal fluid through a microscope. They will look for bacteria and abnormal cells (clue cells), a pH test higher than 4.5, and a positive amine test all associated with BV.  RISKS AND COMPLICATIONS   Pelvic inflammatory disease (PID).  Infections following gynecology surgery.  Developing HIV.  Developing herpes virus. TREATMENT  Sometimes BV will clear up without treatment. However, all women with symptoms of BV should be treated to avoid complications, especially if gynecology surgery is planned. Female partners generally do not need to be treated. However, BV may spread  between female sex partners so treatment is helpful in preventing a recurrence of BV.   BV may be treated with antibiotics. The antibiotics come in either pill or vaginal cream forms. Either can be used with nonpregnant or pregnant women, but the recommended dosages differ. These antibiotics are not harmful to the baby.  BV can recur after treatment. If this happens, a second round of antibiotics will often be prescribed.  Treatment is important for pregnant women. If not treated, BV can cause a premature delivery, especially for a pregnant woman who had a premature birth in the past. All pregnant women who have symptoms of BV should be checked and treated.  For chronic reoccurrence of BV, treatment with a type of prescribed gel vaginally twice a week is helpful. HOME CARE INSTRUCTIONS   Finish all medication as directed by your caregiver.  Do not have sex until treatment is completed.  Tell your sexual partner that you have a vaginal infection. They should see their caregiver and be treated if they have problems, such as a mild rash or itching.  Practice safe sex. Use condoms. Only have 1 sex partner. PREVENTION  Basic prevention steps can help reduce the risk of upsetting the natural balance of bacteria in the vagina and developing BV:  Do not have sexual intercourse (be abstinent).  Do not douche.  Use all of the medicine prescribed for treatment of BV, even if the signs and symptoms go away.  Tell your sex partner if you have BV. That way, they can be treated, if needed, to prevent reoccurrence. SEEK MEDICAL CARE IF:     Your symptoms are not improving after 3 days of treatment.  You have increased discharge, pain, or fever. MAKE SURE YOU:   Understand these instructions.  Will watch your condition.  Will get help right away if you are not doing well or get worse. FOR MORE INFORMATION  Division of STD Prevention (DSTDP), Centers for Disease Control and Prevention:  www.cdc.gov/std American Social Health Association (ASHA): www.ashastd.org  Document Released: 05/03/2005 Document Revised: 07/26/2011 Document Reviewed: 10/24/2008 Exit[employer] Patient Information 2014 [employer].  

## 2012-10-11 NOTE — Progress Notes (Signed)
Patient ID: Cassandra Campos, female   DOB: 27-Sep-1970, 42 y.o.   MRN: [id number] Presents with complaint of vaginal discharge with odor for several days. Denies urinary symptoms, abdominal pain or fever. TAH with BSO. Negative STD screening February 2014. 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  Exam: Appears well, external genitalia within normal limits, speculum exam moderate amount of milky adherent discharge with odor noted, mild erythema. Wet prep positive for yeast, amines, clues, and TNTC bacteria.  BV and yeast  Plan: MetroGel vaginal cream 1 applicator at bedtime x5, alcohol precautions reviewed, Diflucan 150 by mouth times one dose with refill if needed. Instructed to call if no relief of discharge.

## 2012-10-23 ENCOUNTER — Telehealth: Payer: Self-pay | Admitting: *Deleted

## 2012-10-23 DIAGNOSIS — B9689 Other specified bacterial agents as the cause of diseases classified elsewhere: Secondary | ICD-10-CM

## 2012-10-23 DIAGNOSIS — N898 Other specified noninflammatory disorders of vagina: Secondary | ICD-10-CM

## 2012-10-23 DIAGNOSIS — N760 Acute vaginitis: Secondary | ICD-10-CM

## 2012-10-23 MED ORDER — METRONIDAZOLE 0.75 % VA GEL: VAGINAL | Status: DC

## 2012-10-23 NOTE — Telephone Encounter (Signed)
Pt had treatment for BV on 10/11/12 given Rx for MetroGel vaginal cream 1 applicator at bedtime x5 & Diflucan 150 by mouth times one dose with refill if needed. She took rx and noticed the fishy odor is back, pt said she noticed everyttime she has sex with her husband the fishy odor starts. Pt asked if refill could be given or another OV? Please advise

## 2012-10-23 NOTE — Telephone Encounter (Signed)
Pt informed with the below note, rx sent. 

## 2012-10-23 NOTE — Telephone Encounter (Signed)
Okay for refill of MetroGel, one applicator at bedtime x5 and then half an applicator as needed, tell her there is enough for 10 applications. Washout one of the applicators, (usually only 5 applicators come with Rx.)  Review we could try boric acid gelcaps when she is healed, she could use when capsule twice weekly vaginally which may also help.

## 2012-11-01 ENCOUNTER — Ambulatory Visit (INDEPENDENT_AMBULATORY_CARE_PROVIDER_SITE_OTHER): Payer: Managed Care, Other (non HMO) | Admitting: Gynecology

## 2012-11-01 ENCOUNTER — Encounter: Payer: Self-pay | Admitting: Gynecology

## 2012-11-01 VITALS — BP 130/88

## 2012-11-01 DIAGNOSIS — N898 Other specified noninflammatory disorders of vagina: Secondary | ICD-10-CM

## 2012-11-01 DIAGNOSIS — N760 Acute vaginitis: Secondary | ICD-10-CM

## 2012-11-01 DIAGNOSIS — A499 Bacterial infection, unspecified: Secondary | ICD-10-CM

## 2012-11-01 DIAGNOSIS — B9689 Other specified bacterial agents as the cause of diseases classified elsewhere: Secondary | ICD-10-CM

## 2012-11-01 LAB — WET PREP FOR TRICH, YEAST, CLUE
WBC, Wet Prep HPF POC: NONE SEEN
Yeast Wet Prep HPF POC: NONE SEEN

## 2012-11-01 NOTE — Patient Instructions (Addendum)
Bacterial Vaginosis Bacterial vaginosis (BV) is a vaginal infection where the normal balance of bacteria in the vagina is disrupted. The normal balance is then replaced by an overgrowth of certain bacteria. There are several different kinds of bacteria that can cause BV. BV is the most common vaginal infection in women of childbearing age. CAUSES   The cause of BV is not fully understood. BV develops when there is an increase or imbalance of harmful bacteria.  Some activities or behaviors can upset the normal balance of bacteria in the vagina and put women at increased risk including:  Having a new sex partner or multiple sex partners.  Douching.  Using an intrauterine device (IUD) for contraception.  It is not clear what role sexual activity plays in the development of BV. However, women that have never had sexual intercourse are rarely infected with BV. Women do not get BV from toilet seats, bedding, swimming pools or from touching objects around them.  SYMPTOMS   Grey vaginal discharge.  A fish-like odor with discharge, especially after sexual intercourse.  Itching or burning of the vagina and vulva.  Burning or pain with urination.  Some women have no signs or symptoms at all. DIAGNOSIS  Your caregiver must examine the vagina for signs of BV. Your caregiver will perform lab tests and look at the sample of vaginal fluid through a microscope. They will look for bacteria and abnormal cells (clue cells), a pH test higher than 4.5, and a positive amine test all associated with BV.  RISKS AND COMPLICATIONS   Pelvic inflammatory disease (PID).  Infections following gynecology surgery.  Developing HIV.  Developing herpes virus. TREATMENT  Sometimes BV will clear up without treatment. However, all women with symptoms of BV should be treated to avoid complications, especially if gynecology surgery is planned. Female partners generally do not need to be treated. However, BV may spread  between female sex partners so treatment is helpful in preventing a recurrence of BV.   BV may be treated with antibiotics. The antibiotics come in either pill or vaginal cream forms. Either can be used with nonpregnant or pregnant women, but the recommended dosages differ. These antibiotics are not harmful to the baby.  BV can recur after treatment. If this happens, a second round of antibiotics will often be prescribed.  Treatment is important for pregnant women. If not treated, BV can cause a premature delivery, especially for a pregnant woman who had a premature birth in the past. All pregnant women who have symptoms of BV should be checked and treated.  For chronic reoccurrence of BV, treatment with a type of prescribed gel vaginally twice a week is helpful. HOME CARE INSTRUCTIONS   Finish all medication as directed by your caregiver.  Do not have sex until treatment is completed.  Tell your sexual partner that you have a vaginal infection. They should see their caregiver and be treated if they have problems, such as a mild rash or itching.  Practice safe sex. Use condoms. Only have 1 sex partner. PREVENTION  Basic prevention steps can help reduce the risk of upsetting the natural balance of bacteria in the vagina and developing BV:  Do not have sexual intercourse (be abstinent).  Do not douche.  Use all of the medicine prescribed for treatment of BV, even if the signs and symptoms go away.  Tell your sex partner if you have BV. That way, they can be treated, if needed, to prevent reoccurrence. SEEK MEDICAL CARE IF:     Your symptoms are not improving after 3 days of treatment.  You have increased discharge, pain, or fever. MAKE SURE YOU:   Understand these instructions.  Will watch your condition.  Will get help right away if you are not doing well or get worse. FOR MORE INFORMATION  Division of STD Prevention (DSTDP), Centers for Disease Control and Prevention:  www.cdc.gov/std American Social Health Association (ASHA): www.ashastd.org  Document Released: 05/03/2005 Document Revised: 07/26/2011 Document Reviewed: 10/24/2008 Exit[employer] Patient Information 2014 [employer].  

## 2012-11-01 NOTE — Progress Notes (Signed)
42 year old presented to the office today complaining of a brownish discharge with odor for the past few days. Patient with past history of total abdominal hysterectomy with bilateral salpingo-oophorectomy. Patient is currently on estradiol 2 mg by mouth daily for vasomotor symptoms. Patient has a steady partner. She was treated back in May for bacterial vaginosis. Patient had a negative GC and chlamydia culture back in March.   Exam: Birth and urethra Skene was within normal limits Vagina:Slight light brownish discharge with some odor was noted. Vaginal cuff is intact. No other lesions were noted. Bimanual exam: Not done Rectal exam: Not done  GC and Chlamydia culture obtained results pending at time of this dictation Wet prep:pos Amine, many clue cells, tumors to count bacteria  Assessment 4/plan: Patient with recurrent BV. She will be placed on  Boric Acid suppository 600 mg each bedtime for 21 days. We also discussed using  Rephresh probiotic gel twice a week or after intercourse or one Rehresh  tablet daily orally for prevention after she completes a 21 day treatment.

## 2012-11-21 ENCOUNTER — Encounter: Payer: Self-pay | Admitting: Gynecology

## 2012-11-21 ENCOUNTER — Ambulatory Visit (INDEPENDENT_AMBULATORY_CARE_PROVIDER_SITE_OTHER): Payer: Managed Care, Other (non HMO) | Admitting: Gynecology

## 2012-11-21 DIAGNOSIS — N898 Other specified noninflammatory disorders of vagina: Secondary | ICD-10-CM

## 2012-11-21 DIAGNOSIS — N949 Unspecified condition associated with female genital organs and menstrual cycle: Secondary | ICD-10-CM

## 2012-11-21 MED ORDER — METRONIDAZOLE 500 MG PO TABS: 500.0000 mg | ORAL_TABLET | Freq: Two times a day (BID) | ORAL | Status: DC

## 2012-11-21 NOTE — Progress Notes (Signed)
The patient presents having been treated for yeast in February with Diflucan. Symptoms cleared. Subsequently developed discharge and was treated for bacterial vaginosis by Izora Gala in May with MetroGel. Called and was retreated with MetroGel due to persistence of symptoms. Ultimately saw Dr. Toney Rakes in June with wet prep consistent with bacterial vaginosis and was put on boric acid suppositories. Notes that her symptoms have persisted of over primarily but some discharge. She used to report boric acid suppository 2 days ago.  Exam with Copywriter, advertising. External BUS vagina thick white discharge. Bimanual without masses or tenderness.  Assessment and plan: Symptoms consistent with persistent bacterial vaginosis. Wet prep not helpful which I think this is due to the  Recent boric acid suppository. Treatment with MetroGel x2 ineffective. We'll treat with Flagyl 500 mg twice a day x7 days alcohol avoidance reviewed. Patient will followup if symptoms persist, worsen or recur.

## 2012-11-21 NOTE — Patient Instructions (Signed)
Take Flagyl antibiotic twice daily for 7 days. Avoid alcohol while taking. Followup if symptoms persist, worsen or recur.

## 2013-02-08 ENCOUNTER — Other Ambulatory Visit (INDEPENDENT_AMBULATORY_CARE_PROVIDER_SITE_OTHER): Payer: Managed Care, Other (non HMO) | Admitting: *Deleted

## 2013-02-08 ENCOUNTER — Other Ambulatory Visit (INDEPENDENT_AMBULATORY_CARE_PROVIDER_SITE_OTHER): Payer: Managed Care, Other (non HMO)

## 2013-02-08 DIAGNOSIS — Z23 Encounter for immunization: Secondary | ICD-10-CM

## 2013-02-08 DIAGNOSIS — E781 Pure hyperglyceridemia: Secondary | ICD-10-CM

## 2013-02-08 DIAGNOSIS — E119 Type 2 diabetes mellitus without complications: Secondary | ICD-10-CM

## 2013-02-08 LAB — COMPREHENSIVE METABOLIC PANEL
ALT: 16 U/L (ref 0–35)
Albumin: 3.5 g/dL (ref 3.5–5.2)
CO2: 29 mEq/L (ref 19–32)
Calcium: 9.1 mg/dL (ref 8.4–10.5)
Chloride: 104 mEq/L (ref 96–112)
GFR: 65.99 mL/min (ref >60.00)
Sodium: 140 mEq/L (ref 135–145)
Total Protein: 7.5 g/dL (ref 6.0–8.3)

## 2013-02-08 LAB — URINALYSIS, ROUTINE W REFLEX MICROSCOPIC
Bilirubin Urine: NEGATIVE
Ketones, ur: NEGATIVE
Nitrite: NEGATIVE
Total Protein, Urine: 100
pH: 6 (ref 5.0–8.0)

## 2013-02-08 LAB — HEMOGLOBIN A1C: Hgb A1c MFr Bld: 8.2 % — ABNORMAL HIGH (ref 4.6–6.5)

## 2013-02-12 ENCOUNTER — Encounter: Payer: Self-pay | Admitting: Endocrinology

## 2013-02-12 ENCOUNTER — Ambulatory Visit (INDEPENDENT_AMBULATORY_CARE_PROVIDER_SITE_OTHER): Payer: Managed Care, Other (non HMO) | Admitting: Endocrinology

## 2013-02-12 VITALS — BP 110/68 | HR 90 | Temp 98.6°F | Resp 12 | Ht 62.0 in | Wt 243.2 lb

## 2013-02-12 DIAGNOSIS — IMO0001 Reserved for inherently not codable concepts without codable children: Secondary | ICD-10-CM

## 2013-02-12 MED ORDER — CANAGLIFLOZIN 300 MG PO TABS: 300.0000 mg | ORAL_TABLET | Freq: Every day | ORAL | Status: DC

## 2013-02-12 NOTE — Patient Instructions (Addendum)
Lantus 5 units less if am sugar <90 for 3 days  Farxiga 5mg  in am

## 2013-02-12 NOTE — Progress Notes (Signed)
Patient ID: Cassandra Campos, female   DOB: Jul 10, 1970, 42 y.o.   MRN: [id number]  Cassandra Campos is an 42 y.o. female.   Reason for Appointment: Diabetes follow-up   History of Present Illness   Diagnosis: Type 2 DIABETES MELITUS  Previous history: Had taken metformin after diagnosis but probably not extended release, this caused diarrhea  Over the last 2 years she has been taking insulin and has been requiring large doses Her blood sugars have been relatively better with adding Victoza  Recent history: She has not been seen in followup for quite sometime and previous records are not available for comparison She thinks she is fairly compliant with her insulin at mealtimes but has not checked her blood sugars for 2 weeks Although she thinks her blood sugars are fairly good except after supper her A1c is still over 8%     Oral hypoglycemic drugs: None now Side effects from medications: Diarrhea from Metformin Insulin regimen: Lantus 80 units at bedtime     Apidra 40 ac tid      Proper timing of medications in relation to meals: Yes.          Monitors blood glucose: Once a day.    Glucometer: One Touch.          Blood Glucose readings from meter download: readings before breakfast: 2 wks ago 96-114, pcs occasionally 200 +  Hypoglycemia frequency:  none        Meals: 3 meals per day.          Physical activity: exercise: some walking.           The last HbgA1c was reported as ?    Wt Readings from Last 3 Encounters:  02/12/13 243 lb 3.2 oz (110.315 kg)  07/10/12 224 lb (101.606 kg)  06/01/11 219 lb (99.338 kg)    LABS:  Appointment on 02/08/2013  Component Date Value Range Status  . Hemoglobin A1C 02/08/2013 8.2* 4.6 - 6.5 % Final   Glycemic Control Guidelines for People with Diabetes:Non Diabetic:  <6%Goal of Therapy: <7%Additional Action Suggested:  >8%   . Sodium 02/08/2013 140  135 - 145 mEq/L Final  . Potassium 02/08/2013 3.7  3.5 - 5.1 mEq/L Final  . Chloride 02/08/2013  104  96 - 112 mEq/L Final  . CO2 02/08/2013 29  19 - 32 mEq/L Final  . Glucose, Bld 02/08/2013 196* 70 - 99 mg/dL Final  . BUN 02/08/2013 21  6 - 23 mg/dL Final  . Creatinine, Ser 02/08/2013 1.2  0.4 - 1.2 mg/dL Final  . Total Bilirubin 02/08/2013 0.5  0.3 - 1.2 mg/dL Final  . Alkaline Phosphatase 02/08/2013 52  39 - 117 U/L Final  . AST 02/08/2013 12  0 - 37 U/L Final  . ALT 02/08/2013 16  0 - 35 U/L Final  . Total Protein 02/08/2013 7.5  6.0 - 8.3 g/dL Final  . Albumin 02/08/2013 3.5  3.5 - 5.2 g/dL Final  . Calcium 02/08/2013 9.1  8.4 - 10.5 mg/dL Final  . GFR 02/08/2013 65.99  >60.00 mL/min Final  . Microalb, Ur 02/08/2013 126.7 Verified by manual dilution.* 0.0 - 1.9 mg/dL Final  . Creatinine,U 02/08/2013 100.9   Final  . Microalb Creat Ratio 02/08/2013 125.5* 0.0 - 30.0 mg/g Final  . Color, Urine 02/08/2013 LT. YELLOW  Yellow;Lt. Yellow Final  . APPearance 02/08/2013 CLEAR  Clear Final  . Specific Gravity, Urine 02/08/2013 1.025  1.000-1.030 Final  . pH 02/08/2013 6.0  5.0 -  8.0 Final  . Total Protein, Urine 02/08/2013 100  Negative Final  . Urine Glucose 02/08/2013 NEGATIVE  Negative Final  . Ketones, ur 02/08/2013 NEGATIVE  Negative Final  . Bilirubin Urine 02/08/2013 NEGATIVE  Negative Final  . Hgb urine dipstick 02/08/2013 MODERATE  Negative Final  . Urobilinogen, UA 02/08/2013 0.2  0.0 - 1.0 Final  . Leukocytes, UA 02/08/2013 TRACE  Negative Final  . Nitrite 02/08/2013 NEGATIVE  Negative Final  . WBC, UA 02/08/2013 11-20/hpf  0-2/hpf Final  . RBC / HPF 02/08/2013 7-10/hpf  0-2/hpf Final  . Mucus, UA 02/08/2013 Presence of  None Final  . Squamous Epithelial / LPF 02/08/2013 Rare(0-4/hpf)  Rare(0-4/hpf) Final  . Bacteria, UA 02/08/2013 Many(>50/hpf)  None Final      Medication List       This list is accurate as of: 02/12/13 10:27 AM.  Always use your most recent med list.               CRESTOR PO  Take 20 mg by mouth.     estradiol 2 MG tablet  Commonly known  as:  ESTRACE  Take 1 tablet (2 mg total) by mouth daily.     fexofenadine 30 MG tablet  Commonly known as:  ALLEGRA  Take 30 mg by mouth 2 (two) times daily.     furosemide 20 MG tablet  Commonly known as:  LASIX  20 mg.     gabapentin 300 MG capsule  Commonly known as:  NEURONTIN  Take 300 mg by mouth 3 (three) times daily.     HYDROcodone-acetaminophen 5-500 MG per tablet  Commonly known as:  VICODIN  Take 1 tablet by mouth every 6 (six) hours as needed for pain.     insulin glargine 100 UNIT/ML injection  Commonly known as:  LANTUS  Inject 80 Units into the skin at bedtime.     insulin glulisine 100 UNIT/ML injection  Commonly known as:  APIDRA  Inject 30-40 Units into the skin 3 (three) times daily before meals.     LOTEMAX 0.5 % Gel  Generic drug:  Loteprednol Etabonate     naproxen 500 MG tablet  Commonly known as:  NAPROSYN  Take 1 tablet (500 mg total) by mouth 2 (two) times daily.     ofloxacin 0.3 % ophthalmic solution  Commonly known as:  OCUFLOX     ONETOUCH DELICA LANCETS [id number] Misc     ONETOUCH VERIO test strip  Generic drug:  glucose blood  3 (three) times daily.     traMADol 50 MG tablet  Commonly known as:  ULTRAM  Take 1 tablet (50 mg total) by mouth every 6 (six) hours as needed for pain.     TRIBENZOR PO  Take by mouth.     VICTOZA 18 MG/3ML Sopn  Generic drug:  Liraglutide  1.8 mg.        Allergies: No Known Allergies  Past Medical History  Diagnosis Date  . Adenomyosis   . Diabetes mellitus   . High triglycerides   . Hypertension   . Hypercholesteremia     Past Surgical History  Procedure Laterality Date  . Cesarean section      X 2  . Pelvic laparoscopy    . Carpal tunnel release      X 2  . Oophorectomy      BSO  . Abdominal hysterectomy  FEB 2002    TAH/BSO for irregular bleeding and leiomyoma    Family History  Problem Relation Age of Onset  . Diabetes Mother   . Hypertension Mother   . Diabetes Father   .  Hypertension Father   . Cancer Father     STOMACH  . Stroke Maternal Grandmother   . Stroke Maternal Grandfather     Social History:  reports that she has never smoked. She does not have any smokeless tobacco history on file. She reports that  drinks alcohol. She reports that she does not use illicit drugs.  Review of Systems:  Hypertension:  blood pressure is excellent, relatively low today and is on a 3 drug combination along with Lasix. Does not monitor at home No recent swelling of legs   Lipids: She is fairly compliant with her Crestor, lipids followed by PCP      Examination:   BP 110/68  Pulse 90  Temp(Src) 98.6 F (37 C)  Resp 12  Ht 5\' 2"  (1.575 m)  Wt 243 lb 3.2 oz (110.315 kg)  BMI 44.47 kg/m2  SpO2 96%  Body mass index is 44.47 kg/(m^2).   No ankle edema  ASSESSMENT/ PLAN::   Diabetes type 2   Blood glucose control is again suboptimal with A1c over 8% She has not checked her sugar recently and not able to review her previous readings to identify patterns She thinks her fasting readings are fairly good but may be higher after supper Since she is taking large amounts of insulin despite taking Victoza and has gained weight will try Invokana  Discussed how this works, benefits, effects on pressure and glucose as well as weight. She will start with 100 mg and go to 300 mg, patient instruction information given Also discussed titration of Lantus insulin based on morning blood sugars every 3 days, also discussed possibly reducing the mealtime doses especially at breakfast and lunch with starting Invokana and also with her restarting exercise She will leave off her diuretic and cautioned her about lower blood pressure readings with Invokana, to call if she has any lightheadedness  Given new glucose monitor today and she will start monitoring both fasting and postprandial readings for review in 6 weeks  HYPERTENSION: Good control, on a combination medication from  PCP  Counseling time over 50% of today's 25 minute visit  Cassandra Campos 02/12/2013, 10:27 AM

## 2013-02-13 ENCOUNTER — Telehealth: Payer: Self-pay | Admitting: Endocrinology

## 2013-02-13 MED ORDER — "INSULIN SYRINGE 31G X 5/16"" 0.5 ML MISC": 1.0000 | Freq: Four times a day (QID) | Status: DC

## 2013-02-13 NOTE — Telephone Encounter (Signed)
rx sent

## 2013-02-15 ENCOUNTER — Other Ambulatory Visit: Payer: Self-pay | Admitting: *Deleted

## 2013-02-15 MED ORDER — "INSULIN SYRINGE 31G X 5/16"" 0.5 ML MISC": 1.0000 | Freq: Four times a day (QID) | Status: DC

## 2013-02-19 ENCOUNTER — Telehealth: Payer: Self-pay | Admitting: Endocrinology

## 2013-02-19 MED ORDER — INSULIN PEN NEEDLE 32G X 4 MM MISC: 1.0000 | Freq: Every day | Status: DC

## 2013-02-19 NOTE — Telephone Encounter (Signed)
rx sent to [facility], insulin refills faxed to [facility].

## 2013-02-23 ENCOUNTER — Telehealth: Payer: Self-pay | Admitting: Endocrinology

## 2013-02-23 MED ORDER — LIRAGLUTIDE 18 MG/3ML ~~LOC~~ SOPN: 1.8000 mg | PEN_INJECTOR | Freq: Every day | SUBCUTANEOUS | Status: DC

## 2013-02-23 NOTE — Telephone Encounter (Signed)
rx sent for victoza

## 2013-02-26 ENCOUNTER — Telehealth: Payer: Self-pay | Admitting: Endocrinology

## 2013-03-21 ENCOUNTER — Other Ambulatory Visit: Payer: Managed Care, Other (non HMO)

## 2013-03-21 ENCOUNTER — Other Ambulatory Visit (INDEPENDENT_AMBULATORY_CARE_PROVIDER_SITE_OTHER): Payer: Managed Care, Other (non HMO)

## 2013-03-21 DIAGNOSIS — IMO0001 Reserved for inherently not codable concepts without codable children: Secondary | ICD-10-CM

## 2013-03-21 LAB — BASIC METABOLIC PANEL
BUN: 19 mg/dL (ref 6–23)
GFR: 56.81 mL/min — ABNORMAL LOW (ref >60.00)
Potassium: 4.1 mEq/L (ref 3.5–5.1)

## 2013-03-21 LAB — FRUCTOSAMINE: Fructosamine: 219 umol/L (ref <285)

## 2013-03-22 ENCOUNTER — Other Ambulatory Visit: Payer: Self-pay

## 2013-03-26 ENCOUNTER — Ambulatory Visit: Payer: Managed Care, Other (non HMO) | Admitting: Endocrinology

## 2013-03-29 ENCOUNTER — Ambulatory Visit (INDEPENDENT_AMBULATORY_CARE_PROVIDER_SITE_OTHER): Payer: Managed Care, Other (non HMO) | Admitting: Endocrinology

## 2013-03-29 ENCOUNTER — Encounter: Payer: Self-pay | Admitting: Endocrinology

## 2013-03-29 VITALS — BP 134/78 | HR 90 | Temp 98.4°F | Resp 12 | Ht 62.0 in | Wt 244.5 lb

## 2013-03-29 DIAGNOSIS — E781 Pure hyperglyceridemia: Secondary | ICD-10-CM

## 2013-03-29 DIAGNOSIS — I10 Essential (primary) hypertension: Secondary | ICD-10-CM

## 2013-03-29 DIAGNOSIS — IMO0001 Reserved for inherently not codable concepts without codable children: Secondary | ICD-10-CM

## 2013-03-29 NOTE — Patient Instructions (Addendum)
Lantus 75 units at bedtime and may reduce 5 units further if fasting readings stay under 90 Apidra 35-40-40 at meals, adjusted based on meal size and reduce morning dose at lunchtime still low  Walk daily, at least 20 minutes  Please check blood sugars at least half the time about 2 hours after any meal and as directed on waking up. Please bring blood sugar monitor to each visit  Avoid high fat fast food meals

## 2013-03-29 NOTE — Progress Notes (Signed)
Patient ID: Cassandra Campos, female   DOB: 10/15/1970, 42 y.o.   MRN: [id number]  Cassandra Campos is an 42 y.o. female.   Reason for Appointment: Diabetes follow-up   History of Present Illness   Diagnosis: Type 2 DIABETES MELITUS  Previous history: Had taken metformin after diagnosis but probably not extended release, this caused diarrhea  Over the last 2 years she has been taking insulin and has been requiring large doses Her blood sugars have been relatively better with adding Victoza However A1c was still relatively high at 7.9% in 06/2012  Recent history: She was started on Invokana on her last visit because of poor control and difficulty losing weight, last A1c over 8% She also has been requiring large doses of insulin for control She thinks she is fairly compliant with her insulin at mealtimes but has not checked her blood sugars regularly and did not bring her monitor However she has had much better blood sugars now with some low normal and lower readings Fructosamine indicates excellent control She did not reduce her Lantus as discussed previously despite one episode of low sugar overnight and good readings in the mornings     Oral hypoglycemic drugs: Invokana 300 mg. Side effects from medications: Diarrhea from Metformin Insulin regimen: Lantus 80 units at bedtime     Apidra 35-40 ac tid      Proper timing of medications in relation to meals: Yes.          Monitors blood glucose: Once a day.    Glucometer: One Touch.          Blood Glucose readings from recall: readings before breakfast: 107, 97, low 2 am, acl 78, may be higher after supper  Hypoglycemia: Occasionally before lunch, once overnight        Meals: 3 meals per day. She is eating out more and has not lost any weight         Physical activity: exercise: some walking.             Wt Readings from Last 3 Encounters:  03/29/13 244 lb 8 oz (110.904 kg)  02/12/13 243 lb 3.2 oz (110.315 kg)  07/10/12 224 lb (101.606  kg)    LABS:   Lab Results  Component Value Date   HGBA1C 8.2* 02/08/2013   Lab Results  Component Value Date   MICROALBUR 126.7 Verified by manual dilution.* 02/08/2013   CREATININE 1.3* 03/21/2013    No visits with results within 1 Week(s) from this visit. Latest known visit with results is:  Appointment on 03/21/2013  Component Date Value Range Status  . Sodium 03/21/2013 141  135 - 145 mEq/L Final  . Potassium 03/21/2013 4.1  3.5 - 5.1 mEq/L Final  . Chloride 03/21/2013 107  96 - 112 mEq/L Final  . CO2 03/21/2013 29  19 - 32 mEq/L Final  . Glucose, Bld 03/21/2013 107* 70 - 99 mg/dL Final  . BUN 03/21/2013 19  6 - 23 mg/dL Final  . Creatinine, Ser 03/21/2013 1.3* 0.4 - 1.2 mg/dL Final  . Calcium 03/21/2013 9.2  8.4 - 10.5 mg/dL Final  . GFR 03/21/2013 56.81* >60.00 mL/min Final  . Fructosamine 03/21/2013 219  <285 umol/L Final   Comment:                            Variations in levels of serum proteins (albumin and immunoglobulins)  may affect fructosamine results.                                 Medication List       This list is accurate as of: 03/29/13  2:09 PM.  Always use your most recent med list.               Canagliflozin 300 MG Tabs  Commonly known as:  INVOKANA  Take 1 tablet (300 mg total) by mouth daily before breakfast.     CRESTOR PO  Take 20 mg by mouth.     estradiol 2 MG tablet  Commonly known as:  ESTRACE  Take 1 tablet (2 mg total) by mouth daily.     fexofenadine 30 MG tablet  Commonly known as:  ALLEGRA  Take 30 mg by mouth 2 (two) times daily.     gabapentin 300 MG capsule  Commonly known as:  NEURONTIN  Take 300 mg by mouth 3 (three) times daily.     HYDROcodone-acetaminophen 5-500 MG per tablet  Commonly known as:  VICODIN  Take 1 tablet by mouth every 6 (six) hours as needed for pain.     insulin glargine 100 UNIT/ML injection  Commonly known as:  LANTUS  Inject 80 Units into the skin at bedtime.      insulin glulisine 100 UNIT/ML injection  Commonly known as:  APIDRA  Inject 30-40 Units into the skin 3 (three) times daily before meals.     Insulin Pen Needle 32G X 4 MM Misc  Inject 1 each into the skin 5 (five) times daily.     INSULIN SYRINGE .5CC/31GX5/16" 31G X 5/16" 0.5 ML Misc  Inject 1 each into the skin 4 (four) times daily.     Liraglutide 18 MG/3ML Sopn  Commonly known as:  VICTOZA  Inject 1.8 mg into the skin daily.     LOTEMAX 0.5 % Gel  Generic drug:  Loteprednol Etabonate     naproxen 500 MG tablet  Commonly known as:  NAPROSYN  Take 1 tablet (500 mg total) by mouth 2 (two) times daily.     ofloxacin 0.3 % ophthalmic solution  Commonly known as:  OCUFLOX     ONETOUCH DELICA LANCETS [id number] Misc     ONETOUCH VERIO test strip  Generic drug:  glucose blood  3 (three) times daily.     traMADol 50 MG tablet  Commonly known as:  ULTRAM  Take 1 tablet (50 mg total) by mouth every 6 (six) hours as needed for pain.     TRIBENZOR PO  Take by mouth.        Allergies: No Known Allergies  Past Medical History  Diagnosis Date  . Adenomyosis   . Diabetes mellitus   . High triglycerides   . Hypertension   . Hypercholesteremia     Past Surgical History  Procedure Laterality Date  . Cesarean section      X 2  . Pelvic laparoscopy    . Carpal tunnel release      X 2  . Oophorectomy      BSO  . Abdominal hysterectomy  FEB 2002    TAH/BSO for irregular bleeding and leiomyoma    Family History  Problem Relation Age of Onset  . Diabetes Mother   . Hypertension Mother   . Diabetes Father   . Hypertension Father   . Cancer Father  STOMACH  . Stroke Maternal Grandmother   . Stroke Maternal Grandfather     Social History:  reports that she has never smoked. She does not have any smokeless tobacco history on file. She reports that she drinks alcohol. She reports that she does not use illicit drugs.  Review of Systems:  Hypertension:  blood  pressure is excellent, relatively low today and is on a 3 drug combination. Does not monitor at home No recent swelling of legs and Lasix was stopped on the last visit when Invokana started  Creatinine is high normal at 1.3  Lipids: She is fairly compliant with her Crestor, lipids followed by PCP      Examination:   BP 134/78  Pulse 90  Temp(Src) 98.4 F (36.9 C)  Resp 12  Ht 5\' 2"  (1.575 m)  Wt 244 lb 8 oz (110.904 kg)  BMI 44.71 kg/m2  SpO2 99%  Body mass index is 44.71 kg/(m^2).   No ankle edema  ASSESSMENT/ PLAN::   Diabetes type 2   Blood glucose control is significantly better with adding Invokana to her regimen of high dose insulin and Victoza Previous A1c over 8% but fructosamine is low normal now Most of her readings are excellent although she may have some high readings after supper, likely to be from poor diet and eating out Has not lost weight despite using Invokana as yet but needs to be working harder on weight loss including with exercise  Again discussed blood sugar targets before and after meals, timing of glucose monitoring, adjustment of the Lantus based on fasting readings Also may reduce Apidra further if still getting low at lunchtime  Needs to start walking daily at least during her break Avoid high fat fast food meals She will continue Invokana and Victoza unchanged To reduce Lantus by at least 5 units for now and morning Apidra by 5 units also  HYPERTENSION: Good control,  will need to to continue monitoring creatinine which is high normal and associated with proteinuria  Counseling time over 50% of today's 25 minute visit  Cassandra Campos 03/29/2013, 2:09 PM

## 2013-04-23 ENCOUNTER — Other Ambulatory Visit: Payer: Self-pay | Admitting: *Deleted

## 2013-04-23 MED ORDER — INSULIN GLARGINE 100 UNIT/ML ~~LOC~~ SOLN: SUBCUTANEOUS | Status: DC

## 2013-05-14 ENCOUNTER — Other Ambulatory Visit: Payer: Self-pay | Admitting: *Deleted

## 2013-05-14 ENCOUNTER — Telehealth: Payer: Self-pay | Admitting: *Deleted

## 2013-05-14 MED ORDER — INSULIN ASPART 100 UNIT/ML ~~LOC~~ SOLN: SUBCUTANEOUS | Status: DC

## 2013-05-14 NOTE — Telephone Encounter (Signed)
She can change Apidra to NovoLog but will have to continue Lantus; if she cannot get Lantus will have to take Levemir

## 2013-05-14 NOTE — Telephone Encounter (Signed)
Pt is requesting an rx for novolog, she was previously on Lantus and Apidra through patient assistance, but no longer qualifies for that, she wants to know if she can go to novolog instead? CB [id number] or [phone]

## 2013-05-17 HISTORY — PX: TRIGGER FINGER RELEASE: SHX641

## 2013-05-17 HISTORY — PX: RETINAL DETACHMENT SURGERY: SHX105

## 2013-05-24 ENCOUNTER — Other Ambulatory Visit: Payer: Managed Care, Other (non HMO)

## 2013-06-01 ENCOUNTER — Ambulatory Visit: Payer: Managed Care, Other (non HMO) | Admitting: Endocrinology

## 2013-06-07 ENCOUNTER — Encounter: Payer: Self-pay | Admitting: Gynecology

## 2013-06-07 ENCOUNTER — Ambulatory Visit (INDEPENDENT_AMBULATORY_CARE_PROVIDER_SITE_OTHER): Payer: Managed Care, Other (non HMO) | Admitting: Gynecology

## 2013-06-07 DIAGNOSIS — R82998 Other abnormal findings in urine: Secondary | ICD-10-CM

## 2013-06-07 DIAGNOSIS — B3731 Acute candidiasis of vulva and vagina: Secondary | ICD-10-CM

## 2013-06-07 DIAGNOSIS — N898 Other specified noninflammatory disorders of vagina: Secondary | ICD-10-CM

## 2013-06-07 DIAGNOSIS — R8290 Unspecified abnormal findings in urine: Secondary | ICD-10-CM

## 2013-06-07 DIAGNOSIS — B373 Candidiasis of vulva and vagina: Secondary | ICD-10-CM

## 2013-06-07 DIAGNOSIS — N390 Urinary tract infection, site not specified: Secondary | ICD-10-CM

## 2013-06-07 LAB — URINALYSIS W MICROSCOPIC + REFLEX CULTURE
Bilirubin Urine: NEGATIVE
Casts: NONE SEEN
Crystals: NONE SEEN
Ketones, ur: NEGATIVE mg/dL
LEUKOCYTES UA: NEGATIVE
NITRITE: POSITIVE — AB
Specific Gravity, Urine: >1.03 — ABNORMAL HIGH (ref 1.005–1.030)
Urobilinogen, UA: 0.2 mg/dL (ref 0.0–1.0)
pH: 5.5 (ref 5.0–8.0)

## 2013-06-07 LAB — WET PREP FOR TRICH, YEAST, CLUE
Clue Cells Wet Prep HPF POC: NONE SEEN
TRICH WET PREP: NONE SEEN
WBC WET PREP: NONE SEEN

## 2013-06-07 MED ORDER — SULFAMETHOXAZOLE-TRIMETHOPRIM 800-160 MG PO TABS: 1.0000 | ORAL_TABLET | Freq: Two times a day (BID) | ORAL | Status: DC

## 2013-06-07 MED ORDER — FLUCONAZOLE 150 MG PO TABS: 150.0000 mg | ORAL_TABLET | Freq: Once | ORAL | Status: DC

## 2013-06-07 NOTE — Progress Notes (Signed)
Natacia Juillerat May 23, 1970 [id number]        43 y.o.  [id number]   presents with several day history of strong odor to her urine, vaginal discharge and vaginal itching. No fever chills nausea vomiting diarrhea constipation. No frequency dysuria urgency or low back pain.  Past medical history,surgical history, problem list, medications, allergies, family history and social history were all reviewed and documented in the EPIC chart.  Exam: [name] assistant General appearance  Normal Spine straight no CVA tenderness. Abdomen soft nontender without masses guarding rebound organomegaly. Pelvic external BUS vagina with thick white discharge. Bimanual without masses or tenderness.   Assessment/Plan:  43 y.o. [id number]:   1. Vaginal discharge and itching. Wet prep positive for yeast. Will treat with Diflucan 150 mg daily x2. 2. Strong odor to the urine. Urinalysis consistent with UTI. Treat with Septra DS 1 by mouth twice a day x3 days. Followup if either symptoms continue, worsen or recur. Patient due for her annual exam end of February/March and patient has scheduled.   Note: This document was prepared with digital dictation and possible smart phrase technology. Any transcriptional errors that result from this process are unintentional.   Anastasio Auerbach MD, 9:59 AM 06/07/2013

## 2013-06-07 NOTE — Patient Instructions (Signed)
Take Diflucan pill for 2 days. Take Septra antibiotic twice daily for 3 days. Followup if symptoms persist, worsen or recur.

## 2013-06-09 LAB — URINE CULTURE

## 2013-06-27 ENCOUNTER — Other Ambulatory Visit: Payer: Self-pay

## 2013-06-27 DIAGNOSIS — Z1231 Encounter for screening mammogram for malignant neoplasm of breast: Secondary | ICD-10-CM

## 2013-07-12 ENCOUNTER — Encounter: Payer: Managed Care, Other (non HMO) | Admitting: Gynecology

## 2013-07-12 ENCOUNTER — Ambulatory Visit: Payer: Managed Care, Other (non HMO)

## 2013-08-16 ENCOUNTER — Ambulatory Visit (INDEPENDENT_AMBULATORY_CARE_PROVIDER_SITE_OTHER): Payer: Managed Care, Other (non HMO) | Admitting: Gynecology

## 2013-08-16 ENCOUNTER — Encounter: Payer: Self-pay | Admitting: Gynecology

## 2013-08-16 ENCOUNTER — Ambulatory Visit
Admission: RE | Admit: 2013-08-16 | Discharge: 2013-08-16 | Disposition: A | Discharge status: Home / Self Care | Payer: Managed Care, Other (non HMO) | Source: Ambulatory Visit

## 2013-08-16 VITALS — BP 140/90 | Ht 63.0 in | Wt 246.0 lb

## 2013-08-16 DIAGNOSIS — B9689 Other specified bacterial agents as the cause of diseases classified elsewhere: Secondary | ICD-10-CM

## 2013-08-16 DIAGNOSIS — N760 Acute vaginitis: Secondary | ICD-10-CM

## 2013-08-16 DIAGNOSIS — Z79890 Hormone replacement therapy (postmenopausal): Secondary | ICD-10-CM

## 2013-08-16 DIAGNOSIS — A499 Bacterial infection, unspecified: Secondary | ICD-10-CM

## 2013-08-16 DIAGNOSIS — Z1231 Encounter for screening mammogram for malignant neoplasm of breast: Secondary | ICD-10-CM

## 2013-08-16 DIAGNOSIS — B373 Candidiasis of vulva and vagina: Secondary | ICD-10-CM

## 2013-08-16 DIAGNOSIS — Z01419 Encounter for gynecological examination (general) (routine) without abnormal findings: Secondary | ICD-10-CM

## 2013-08-16 DIAGNOSIS — N898 Other specified noninflammatory disorders of vagina: Secondary | ICD-10-CM

## 2013-08-16 DIAGNOSIS — I10 Essential (primary) hypertension: Secondary | ICD-10-CM

## 2013-08-16 DIAGNOSIS — N949 Unspecified condition associated with female genital organs and menstrual cycle: Secondary | ICD-10-CM

## 2013-08-16 DIAGNOSIS — B3731 Acute candidiasis of vulva and vagina: Secondary | ICD-10-CM

## 2013-08-16 LAB — URINALYSIS W MICROSCOPIC + REFLEX CULTURE
Bacteria, UA: NONE SEEN
Bilirubin Urine: NEGATIVE
Casts: NONE SEEN
Crystals: NONE SEEN
Glucose, UA: >1000 mg/dL — AB
Ketones, ur: NEGATIVE mg/dL
Leukocytes, UA: NEGATIVE
NITRITE: NEGATIVE
Protein, ur: >300 mg/dL — AB
SPECIFIC GRAVITY, URINE: 1.026 (ref 1.005–1.030)
UROBILINOGEN UA: 0.2 mg/dL (ref 0.0–1.0)
pH: 5.5 (ref 5.0–8.0)

## 2013-08-16 LAB — WET PREP FOR TRICH, YEAST, CLUE: Trich, Wet Prep: NONE SEEN

## 2013-08-16 MED ORDER — FLUCONAZOLE 150 MG PO TABS: 150.0000 mg | ORAL_TABLET | Freq: Once | ORAL | Status: DC

## 2013-08-16 MED ORDER — ESTRADIOL 2 MG PO TABS: 2.0000 mg | ORAL_TABLET | Freq: Every day | ORAL | Status: DC

## 2013-08-16 MED ORDER — CLINDAMYCIN PHOSPHATE 2 % VA CREA: 1.0000 | TOPICAL_CREAM | Freq: Every day | VAGINAL | Status: DC

## 2013-08-16 NOTE — Patient Instructions (Signed)
Take Diflucan pill once for the yeast. Use the Cleocin vaginal cream nightly for 7 days intravaginal. Followup in one year for annual exam, sooner as needed.

## 2013-08-16 NOTE — Progress Notes (Signed)
Cassandra Campos 19-Jun-1970 [id number]        42 y.o.  [id number] for annual exam.  Several issues noted below.  Past medical history,surgical history, problem list, medications, allergies, family history and social history were all reviewed and documented in the EPIC chart.  ROS:  Performed and pertinent positives and negatives are included in the history, assessment and plan .  Exam: [name] assistant Filed Vitals:   08/16/13 0902  BP: 140/90  Height: 5\' 3"  (1.6 m)  Weight: 246 lb (111.585 kg)   General appearance  Normal Skin grossly normal Head/Neck normal with no cervical or supraclavicular adenopathy thyroid normal Lungs  clear Cardiac RR, without RMG Abdominal  soft, nontender, without masses, organomegaly or hernia Breasts  examined lying and sitting without masses, retractions, discharge or axillary adenopathy. Pelvic  Ext/BUS/vagina with white cakey discharge.  Adnexa  Without masses or tenderness    Anus and perineum  Normal   Rectovaginal  Normal sphincter tone without palpated masses or tenderness.    Assessment/Plan:  43 y.o. [id number] female for annual exam.   1. HRT status post TAH/BSO for leiomyomata. On Estrace 2 mg daily. I again reviewed the risks of ERT to include the WHI study with increased risk of stroke heart attack DVT and possibly breast cancer. Patient's comfortable continuing and that her younger age I think this certainly is reasonable. Benefits as far as cardiovascular protection and bone health reviewed. Refill x1 year provided. 2. Vaginal odor after intercourse. Vaginal discharge today. Wet prep positive for yeast and bacterial vaginosis. Will treat with Diflucan 150 mg x1 dose and Cleocin vaginal cream nightly x7 days at her choice. The vaginal odor after intercourse sounds more biochemical and that it lasts for one day or so and then resolves. Recommended that her partner be evaluated for possible prostatitis and she may want to consider vaginal douche after  intercourse. 3. Pap smear 2013. No Pap smear done today. No history of abnormal Pap smears previously. Status post hysterectomy for benign indications. Options to stop screening altogether versus less frequent screening intervals reviewed. Will readdress on an annual basis. 4. Mammography today. Continue with annual mammography. SBE monthly reviewed. 5. Health maintenance. Blood pressure 140/90 reviewed with her. She is being followed for hypertension recommended she continue to follow this with her primary. No blood work done as this is all done through her primary physician's office. Followup one year, sooner as needed.   Note: This document was prepared with digital dictation and possible smart phrase technology. Any transcriptional errors that result from this process are unintentional.   Anastasio Auerbach MD, 9:49 AM 08/16/2013

## 2013-08-20 ENCOUNTER — Other Ambulatory Visit: Payer: Self-pay | Admitting: *Deleted

## 2013-08-20 LAB — URINE CULTURE: Colony Count: 85000

## 2013-08-20 MED ORDER — AMPICILLIN 500 MG PO CAPS: ORAL_CAPSULE | ORAL | Status: DC

## 2013-12-13 ENCOUNTER — Telehealth: Payer: Self-pay

## 2013-12-13 NOTE — Telephone Encounter (Signed)
Diabetic Bundle. Lvom for pt to call back and schedule appointment with Dr. Dwyane Dee.

## 2013-12-25 ENCOUNTER — Encounter (HOSPITAL_BASED_OUTPATIENT_CLINIC_OR_DEPARTMENT_OTHER): Payer: Self-pay | Admitting: Emergency Medicine

## 2013-12-25 ENCOUNTER — Emergency Department (HOSPITAL_BASED_OUTPATIENT_CLINIC_OR_DEPARTMENT_OTHER)
Admission: EM | Admit: 2013-12-25 | Discharge: 2013-12-25 | Disposition: A | Discharge status: Home / Self Care | Payer: Managed Care, Other (non HMO) | Attending: Emergency Medicine | Admitting: Emergency Medicine

## 2013-12-25 DIAGNOSIS — Z79899 Other long term (current) drug therapy: Secondary | ICD-10-CM | POA: Diagnosis not present

## 2013-12-25 DIAGNOSIS — I10 Essential (primary) hypertension: Secondary | ICD-10-CM | POA: Diagnosis not present

## 2013-12-25 DIAGNOSIS — M542 Cervicalgia: Secondary | ICD-10-CM | POA: Insufficient documentation

## 2013-12-25 DIAGNOSIS — R110 Nausea: Secondary | ICD-10-CM | POA: Diagnosis not present

## 2013-12-25 DIAGNOSIS — J3489 Other specified disorders of nose and nasal sinuses: Secondary | ICD-10-CM | POA: Diagnosis not present

## 2013-12-25 DIAGNOSIS — Z8742 Personal history of other diseases of the female genital tract: Secondary | ICD-10-CM | POA: Diagnosis not present

## 2013-12-25 DIAGNOSIS — E119 Type 2 diabetes mellitus without complications: Secondary | ICD-10-CM | POA: Insufficient documentation

## 2013-12-25 DIAGNOSIS — R51 Headache: Secondary | ICD-10-CM | POA: Insufficient documentation

## 2013-12-25 DIAGNOSIS — R519 Headache, unspecified: Secondary | ICD-10-CM

## 2013-12-25 DIAGNOSIS — Z794 Long term (current) use of insulin: Secondary | ICD-10-CM | POA: Diagnosis not present

## 2013-12-25 DIAGNOSIS — E7800 Pure hypercholesterolemia, unspecified: Secondary | ICD-10-CM | POA: Insufficient documentation

## 2013-12-25 MED ORDER — CLONIDINE HCL 0.1 MG PO TABS
0.2000 mg | ORAL_TABLET | Freq: Once | ORAL | Status: AC
  Administered 2013-12-25: 0.2 mg via ORAL
  Filled 2013-12-25: qty 2

## 2013-12-25 MED ORDER — DIPHENHYDRAMINE HCL 25 MG PO CAPS
25.0000 mg | ORAL_CAPSULE | Freq: Once | ORAL | Status: AC
  Administered 2013-12-25: 25 mg via ORAL
  Filled 2013-12-25: qty 1

## 2013-12-25 MED ORDER — OXYMETAZOLINE HCL 0.05 % NA SOLN: 1.0000 | Freq: Two times a day (BID) | NASAL | Status: DC

## 2013-12-25 MED ORDER — METOCLOPRAMIDE HCL 10 MG PO TABS
10.0000 mg | ORAL_TABLET | Freq: Once | ORAL | Status: AC
  Administered 2013-12-25: 10 mg via ORAL
  Filled 2013-12-25: qty 1

## 2013-12-25 MED ORDER — MOMETASONE FUROATE 50 MCG/ACT NA SUSP
2.0000 | Freq: Every day | NASAL | Status: DC
Start: 2013-12-25 — End: 2015-03-18

## 2013-12-25 MED ORDER — IBUPROFEN 400 MG PO TABS
600.0000 mg | ORAL_TABLET | Freq: Once | ORAL | Status: AC
  Administered 2013-12-25: 600 mg via ORAL
  Filled 2013-12-25 (×2): qty 1

## 2013-12-25 MED ORDER — TRAMADOL HCL 50 MG PO TABS: 50.0000 mg | ORAL_TABLET | Freq: Four times a day (QID) | ORAL | Status: DC | PRN

## 2013-12-25 NOTE — ED Notes (Signed)
Headache since lunch on Monday with nausea, pt denies vomiting or fever. Pt drove self here and ambulates with a steady gait.

## 2013-12-25 NOTE — ED Provider Notes (Signed)
CSN: [id number]     Arrival date & time 12/25/13  0056 History   First MD Initiated Contact with Patient 12/25/13 0108     Chief Complaint  Patient presents with  . Headache     (Consider location/radiation/quality/duration/timing/severity/associated sxs/prior Treatment) HPI Patient presents with gradual onset right-sided headache starting late Sunday and worsening throughout the day today. 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 She complains of mild nausea. She's had no fever or chills. She states she missed her dose of blood pressure medication today and yesterday as well. She has no focal weakness or numbness. She has no known patient changes. She is walking without difficulty. She has mild left-sided neck pain but no stiffness. Past Medical History  Diagnosis Date  . Adenomyosis   . Diabetes mellitus   . High triglycerides   . Hypertension   . Hypercholesteremia    Past Surgical History  Procedure Laterality Date  . Cesarean section      X 2  . Pelvic laparoscopy    . Carpal tunnel release      X 2  . Oophorectomy      BSO  . Abdominal hysterectomy  FEB 2002    TAH/BSO for irregular bleeding and leiomyoma  . Eye surgery      Retina reattachment   Family History  Problem Relation Age of Onset  . Diabetes Mother   . Hypertension Mother   . Diabetes Father   . Hypertension Father   . Cancer Father     STOMACH  . Stroke Maternal Grandmother   . Stroke Maternal Grandfather    History  Substance Use Topics  . Smoking status: Never Smoker   . Smokeless tobacco: Not on file  . Alcohol Use: 0.0 oz/week     Comment: occasional   OB History   Grav Para Term Preterm Abortions TAB SAB Ect Mult Living   5 2 2  3     2      Review of Systems  Constitutional: Negative for fever and chills.  HENT: Positive for congestion and sinus pressure. Negative for sore throat.   Eyes: Negative  for photophobia and visual disturbance.  Respiratory: Negative for cough and shortness of breath.   Cardiovascular: Negative for chest pain, palpitations and leg swelling.  Gastrointestinal: Positive for nausea. Negative for vomiting and abdominal pain.  Genitourinary: Negative for dysuria, frequency and flank pain.  Musculoskeletal: Positive for neck pain. Negative for arthralgias, back pain and neck stiffness.  Skin: Negative for pallor, rash and wound.  Neurological: Positive for headaches. Negative for dizziness, weakness, light-headedness and numbness.  All other systems reviewed and are negative.     Allergies  Review of patient's allergies indicates no known allergies.  Home Medications   Prior to Admission medications   Medication Sig Start Date End Date Taking? Authorizing Provider  Canagliflozin (INVOKANA) 300 MG TABS Take 1 tablet (300 mg total) by mouth daily before breakfast. 02/12/13  Yes Elayne Snare, MD  estradiol (ESTRACE) 2 MG tablet Take 1 tablet (2 mg total) by mouth daily. 08/16/13  Yes Anastasio Auerbach, MD  gabapentin (NEURONTIN) 300 MG capsule Take 300 mg by mouth 3 (three) times daily.   Yes Historical Provider, MD  insulin aspart (NOVOLOG) 100 UNIT/ML injection Inject 30-40 units 3 times a day 05/14/13  Yes Elayne Snare, MD  insulin detemir (LEVEMIR) 100 UNIT/ML injection Inject 80 Units into  the skin 2 (two) times daily.   Yes Historical Provider, MD  insulin glargine (LANTUS) 100 UNIT/ML injection Inject 80 units twice daily 04/23/13  Yes Elayne Snare, MD  Insulin Pen Needle 32G X 4 MM MISC Inject 1 each into the skin 5 (five) times daily. 02/19/13  Yes Elayne Snare, MD  Insulin Syringe-Needle U-100 (INSULIN SYRINGE .5CC/31GX5/16") 31G X 5/16" 0.5 ML MISC Inject 1 each into the skin 4 (four) times daily. 02/15/13  Yes Elayne Snare, MD  Liraglutide (VICTOZA) 18 MG/3ML SOPN Inject 1.8 mg into the skin daily. 02/23/13  Yes Elayne Snare, MD  [facility] DELICA LANCETS [id number] [place]   11/29/12  Yes Historical Provider, MD  [facility] VERIO test strip 3 (three) times daily. 11/24/12  Yes Historical Provider, MD  Rosuvastatin Calcium (CRESTOR PO) Take 20 mg by mouth.    Yes Historical Provider, MD  ampicillin (PRINCIPEN) 500 MG capsule Take 1 tablet by mouth 4 times a day for 5 days 08/20/13   Anastasio Auerbach, MD  clindamycin (CLEOCIN) 2 % vaginal cream Place 1 Applicatorful vaginally at bedtime. For 7 days 08/16/13   Anastasio Auerbach, MD  fexofenadine (ALLEGRA) 30 MG tablet Take 30 mg by mouth 2 (two) times daily.      Historical Provider, MD  fluconazole (DIFLUCAN) 150 MG tablet Take 1 tablet (150 mg total) by mouth once. 08/16/13   Anastasio Auerbach, MD  Olmesartan-Amlodipine-HCTZ (TRIBENZOR PO) Take by mouth.      Historical Provider, MD   BP 200/109  Pulse 98  Temp(Src) 99 F (37.2 C) (Oral)  Resp 20  SpO2 99% Physical Exam  Nursing note and vitals reviewed. Constitutional: She is oriented to person, place, and time. She appears well-developed and well-nourished. No distress.  HENT:  Head: Normocephalic and atraumatic.  Mouth/Throat: Oropharynx is clear and moist.  Right-sided nasal mucosal edema. Patient also has tenderness to percussion over the right frontal sinus.  Eyes: EOM are normal. Pupils are equal, round, and reactive to light.  Neck: Normal range of motion. Neck supple.  Mild left trapezius muscle tenderness to palpation. Patient has no midline tenderness. Patient has no meningismus  Cardiovascular: Normal rate and regular rhythm.  Exam reveals no gallop and no friction rub.   No murmur heard. Pulmonary/Chest: Effort normal and breath sounds normal. No stridor. No respiratory distress. She has no wheezes. She has no rales. She exhibits no tenderness.  Abdominal: Soft. Bowel sounds are normal. She exhibits no distension and no mass. There is no tenderness. There is no rebound and no guarding.  Musculoskeletal: Normal range of motion. She exhibits no edema and  no tenderness.  No calf swelling or tenderness.  Lymphadenopathy:    She has no cervical adenopathy.  Neurological: She is alert and oriented to person, place, and time.  Patient is alert and oriented x3 with clear, goal oriented speech. Patient has 5/5 motor in all extremities. Sensation is intact to light touch. Bilateral finger-to-nose is normal with no signs of dysmetria. Patient has a normal gait and walks without assistance.   Skin: Skin is warm and dry. No rash noted. No erythema.  Psychiatric: She has a normal mood and affect. Her behavior is normal.    ED Course  Procedures (including critical care time) Labs Review Labs Reviewed - No data to display  Imaging Review No results found.   EKG Interpretation None      MDM   Final diagnoses:  None    Patient with likely frontal  sinusitis. She also quite hypertensive due to missing her medication doses. Both be contributing to her headache. I very low suspicion for encephalitis/meningitis. The suspicion for intracranial bleed. Will treat both the blood pressure and headache and reevaluate.  Patient's blood pressures improved with medication. She does have mild headache though it is improved. She continues to have normal neurologic exam. She's been advised to take her blood pressure medication as prescribed. He's also been advised to follow with her primary Dr. to have blood pressure rechecked. Will treat for sinus infection. She's been given strict return precautions and has voice understanding.  Julianne Rice, MD 12/25/13 [phone]

## 2013-12-25 NOTE — Discharge Instructions (Signed)
Call and make an appointment to followup with your primary MD to recheck your blood pressure. Return immediately for worsening headache, vision changes, persistent vomiting, focal weakness/numbness or for any concerns.  Sinus Headache A sinus headache is when your sinuses become clogged or swollen. Sinus headaches can range from mild to severe.  CAUSES A sinus headache can have different causes, such as:  Colds.  Sinus infections.  Allergies. SYMPTOMS  Symptoms of a sinus headache may vary and can include:  Headache.  Pain or pressure in the face.  Congested or runny nose.  Fever.  Inability to smell.  Pain in upper teeth. Weather changes can make symptoms worse. TREATMENT  The treatment of a sinus headache depends on the cause.  Sinus pain caused by a sinus infection may be treated with antibiotic medicine.  Sinus pain caused by allergies may be helped by allergy medicines (antihistamines) and medicated nasal sprays.  Sinus pain caused by congestion may be helped by flushing the nose and sinuses with saline solution. HOME CARE INSTRUCTIONS   If antibiotics are prescribed, take them as directed. Finish them even if you start to feel better.  Only take over-the-counter or prescription medicines for pain, discomfort, or fever as directed by your caregiver.  If you have congestion, use a nasal spray to help reduce pressure. SEEK IMMEDIATE MEDICAL CARE IF:  You have a fever.  You have headaches more than once a week.  You have sensitivity to light or sound.  You have repeated nausea and vomiting.  You have vision problems.  You have sudden, severe pain in your face or head.  You have a seizure.  You are confused.  Your sinus headaches do not get better after treatment. Many people think they have a sinus headache when they actually have migraines or tension headaches. MAKE SURE YOU:   Understand these instructions.  Will watch your condition.  Will get  help right away if you are not doing well or get worse. Document Released: 06/10/2004 Document Revised: 07/26/2011 Document Reviewed: 08/01/2010 [facility] Patient Information 2015 [place], [place]. This information is not intended to replace advice given to you by your health care provider. Make sure you discuss any questions you have with your health care provider.  Hypertension Hypertension, commonly called high blood pressure, is when the force of blood pumping through your arteries is too strong. Your arteries are the blood vessels that carry blood from your heart throughout your body. A blood pressure reading consists of a higher number over a lower number, such as 110/72. The higher number (systolic) is the pressure inside your arteries when your heart pumps. The lower number (diastolic) is the pressure inside your arteries when your heart relaxes. Ideally you want your blood pressure below 120/80. Hypertension forces your heart to work harder to pump blood. Your arteries may become narrow or stiff. Having hypertension puts you at risk for heart disease, stroke, and other problems.  RISK FACTORS Some risk factors for high blood pressure are controllable. Others are not.  Risk factors you cannot control include:   Race. You may be at higher risk if you are African American.  Age. Risk increases with age.  Gender. Men are at higher risk than women before age 69 years. After age 12, women are at higher risk than men. Risk factors you can control include:  Not getting enough exercise or physical activity.  Being overweight.  Getting too much fat, sugar, calories, or salt in your diet.  Drinking too  much alcohol. SIGNS AND SYMPTOMS Hypertension does not usually cause signs or symptoms. Extremely high blood pressure (hypertensive crisis) may cause headache, anxiety, shortness of breath, and nosebleed. DIAGNOSIS  To check if you have hypertension, your health care provider will measure your  blood pressure while you are seated, with your arm held at the level of your heart. It should be measured at least twice using the same arm. Certain conditions can cause a difference in blood pressure between your right and left arms. A blood pressure reading that is higher than normal on one occasion does not mean that you need treatment. If one blood pressure reading is high, ask your health care provider about having it checked again. TREATMENT  Treating high blood pressure includes making lifestyle changes and possibly taking medicine. Living a healthy lifestyle can help lower high blood pressure. You may need to change some of your habits. Lifestyle changes may include:  Following the DASH diet. This diet is high in fruits, vegetables, and whole grains. It is low in salt, red meat, and added sugars.  Getting at least 2 hours of brisk physical activity every week.  Losing weight if necessary.  Not smoking.  Limiting alcoholic beverages.  Learning ways to reduce stress. If lifestyle changes are not enough to get your blood pressure under control, your health care provider may prescribe medicine. You may need to take more than one. Work closely with your health care provider to understand the risks and benefits. HOME CARE INSTRUCTIONS  Have your blood pressure rechecked as directed by your health care provider.   Take medicines only as directed by your health care provider. Follow the directions carefully. Blood pressure medicines must be taken as prescribed. The medicine does not work as well when you skip doses. Skipping doses also puts you at risk for problems.   Do not smoke.   Monitor your blood pressure at home as directed by your health care provider. SEEK MEDICAL CARE IF:   You think you are having a reaction to medicines taken.  You have recurrent headaches or feel dizzy.  You have swelling in your ankles.  You have trouble with your vision. SEEK IMMEDIATE MEDICAL  CARE IF:  You develop a severe headache or confusion.  You have unusual weakness, numbness, or feel faint.  You have severe chest or abdominal pain.  You vomit repeatedly.  You have trouble breathing. MAKE SURE YOU:   Understand these instructions.  Will watch your condition.  Will get help right away if you are not doing well or get worse. Document Released: 05/03/2005 Document Revised: 09/17/2013 Document Reviewed: 02/23/2013 [facility] Patient Information 2015 [place], [place]. This information is not intended to replace advice given to you by your health care provider. Make sure you discuss any questions you have with your health care provider.

## 2014-03-18 ENCOUNTER — Encounter (HOSPITAL_BASED_OUTPATIENT_CLINIC_OR_DEPARTMENT_OTHER): Payer: Self-pay | Admitting: Emergency Medicine

## 2014-04-04 ENCOUNTER — Other Ambulatory Visit: Payer: Self-pay | Admitting: Internal Medicine

## 2014-04-04 DIAGNOSIS — N183 Chronic kidney disease, stage 3 unspecified: Secondary | ICD-10-CM

## 2014-04-10 ENCOUNTER — Ambulatory Visit
Admission: RE | Admit: 2014-04-10 | Discharge: 2014-04-10 | Disposition: A | Discharge status: Home / Self Care | Payer: Managed Care, Other (non HMO) | Source: Ambulatory Visit | Attending: Internal Medicine | Admitting: Internal Medicine

## 2014-04-10 DIAGNOSIS — N183 Chronic kidney disease, stage 3 unspecified: Secondary | ICD-10-CM

## 2014-07-02 NOTE — Telephone Encounter (Signed)
error 

## 2014-07-11 ENCOUNTER — Other Ambulatory Visit: Payer: Self-pay

## 2014-07-11 DIAGNOSIS — Z1231 Encounter for screening mammogram for malignant neoplasm of breast: Secondary | ICD-10-CM

## 2014-07-24 ENCOUNTER — Ambulatory Visit (INDEPENDENT_AMBULATORY_CARE_PROVIDER_SITE_OTHER): Payer: Managed Care, Other (non HMO) | Admitting: Gynecology

## 2014-07-24 ENCOUNTER — Encounter: Payer: Self-pay | Admitting: Gynecology

## 2014-07-24 VITALS — BP 134/84

## 2014-07-24 DIAGNOSIS — R229 Localized swelling, mass and lump, unspecified: Secondary | ICD-10-CM

## 2014-07-24 DIAGNOSIS — M629 Disorder of muscle, unspecified: Secondary | ICD-10-CM | POA: Diagnosis not present

## 2014-07-24 NOTE — Patient Instructions (Signed)
Follow up if the small bump in the abdomen starts to enlarge or become tender. If it stays the same and will just watch it.

## 2014-07-24 NOTE — Progress Notes (Addendum)
Cassandra Campos 07/04/1970 [id number]        44 y.o.  [id number] Presents having noticed a small subcutaneous bump over the last several weeks. Never noticed before. Not tender, red or draining. No history of same before.  Past medical history,surgical history, problem list, medications, allergies, family history and social history were all reviewed and documented in the EPIC chart.  Directed ROS with pertinent positives and negatives documented in the history of present illness/assessment and plan.  Exam: Filed Vitals:   07/24/14 0814  BP: 134/84   General appearance:  Normal Abdomen obese with small subcutaneous nodule proximally 8 cm above umbilicus in the midline. 1 cm across. Firm mobile with no overlying skin changes. Also with 3 cm fascial weakness above umbilicus. Not true hernia without protrusion with standing but weaker than the surrounding fascia. Nontender. No other masses, tenderness, organomegaly noted. Physical Exam  Abdominal:       Assessment/Plan:  44 y.o. [id number] with:  1. Small subcutaneous nodule. Benign in exam. Reviewed with patient may have been present for years unnoticed versus new finding. Recommended as it does not bother the patient that she continue with self-exam as long as remains stable results will follow. If enlarges or becomes tender will call and refer to general surgery. 2. Fascial weakness above the umbilicus. Not true hernia but weaker than the surrounding fascia. Discussed with patient. Will monitor and if she notices any bulging or discomfort in the area she will call and I will refer to general surgery. 3. Health maintenance. Patient due for her annual exam in April and will follow up for this.     Anastasio Auerbach MD, 8:24 AM 07/24/2014

## 2014-08-20 ENCOUNTER — Ambulatory Visit: Payer: Managed Care, Other (non HMO)

## 2014-08-26 ENCOUNTER — Other Ambulatory Visit: Payer: Self-pay | Admitting: Gynecology

## 2014-09-05 ENCOUNTER — Ambulatory Visit (INDEPENDENT_AMBULATORY_CARE_PROVIDER_SITE_OTHER): Payer: Managed Care, Other (non HMO) | Admitting: Gynecology

## 2014-09-05 ENCOUNTER — Encounter: Payer: Self-pay | Admitting: Gynecology

## 2014-09-05 ENCOUNTER — Other Ambulatory Visit (HOSPITAL_COMMUNITY)
Admission: RE | Admit: 2014-09-05 | Discharge: 2014-09-05 | Disposition: A | Discharge status: Home / Self Care | Payer: Managed Care, Other (non HMO) | Source: Ambulatory Visit | Attending: Gynecology | Admitting: Gynecology

## 2014-09-05 VITALS — BP 122/78 | Ht 63.0 in | Wt 250.0 lb

## 2014-09-05 DIAGNOSIS — K439 Ventral hernia without obstruction or gangrene: Secondary | ICD-10-CM | POA: Diagnosis not present

## 2014-09-05 DIAGNOSIS — Z79890 Hormone replacement therapy (postmenopausal): Secondary | ICD-10-CM | POA: Diagnosis not present

## 2014-09-05 DIAGNOSIS — Z01419 Encounter for gynecological examination (general) (routine) without abnormal findings: Secondary | ICD-10-CM

## 2014-09-05 MED ORDER — ESTRADIOL 2 MG PO TABS: 2.0000 mg | ORAL_TABLET | Freq: Every day | ORAL | Status: DC

## 2014-09-05 NOTE — Progress Notes (Signed)
Cassandra Campos 08-05-1970 [id number]        44 y.o.  [id number] for annual exam.  Several issues noted below.  Past medical history,surgical history, problem list, medications, allergies, family history and social history were all reviewed and documented as reviewed in the EPIC chart.  ROS:  Performed with pertinent positives and negatives included in the history, assessment and plan.   Additional significant findings :  none   Exam: [name] Counsellor Vitals:   09/05/14 0908  BP: 122/78  Height: 5\' 3"  (1.6 m)  Weight: 250 lb (113.399 kg)   General appearance:  Normal affect, orientation and appearance. Skin: Grossly normal HEENT: Without gross lesions.  No cervical or supraclavicular adenopathy. Thyroid normal.  Lungs:  Clear without wheezing, rales or rhonchi Cardiac: RR, without RMG Abdominal:  Soft, nontender, without masses, guarding, rebound, organomegaly. With 3 cm fascial weakness above right of the umbilicus Physical Exam  Abdominal:      Breasts:  Examined lying and sitting without masses, retractions, discharge or axillary adenopathy. Pelvic:  Ext/BUS/vagina normal  Adnexa  Without masses or tenderness    Anus and perineum  Normal   Rectovaginal  Normal sphincter tone without palpated masses or tenderness.    Assessment/Plan:  44 y.o. [id number] female for annual exam.   1. Status post TAH/BSO for irregular bleeding/leiomyoma. On estradiol 2 mg daily. Tried weaning with unacceptable hot flushes. I again reviewed the whole issue of ERT to include increased risk of stroke heart attack DVT and possible breast cancer. Benefits in premature menopause from a cardiovascular and bone health standpoint also discussed. Possible increased risksdue to her other medical issues including hypercholesterolemia, hypertension diabetes all reviewed. After lengthy discussion we both agree to continue on her estradiol now and refill 1 year provided. 2. Fascial weakness. Patient is  asymptomatic. Options to refer to general surgery to consider mesh repair versus observation discussed. Patient's comfortable with observation. Will call if any issues or wants referral. 3. Prior subcutaneous nodule anterior abdominal wall resolved. No evidence of this on exam today. 4. Mammography do now and patient knows to schedule and agrees to do so. SBE monthly reviewed. 5. Pap smear 2013. Pap smear of vaginal cuff today. Options to stop screening altogether she is status post hysterectomy for benign indications in no history of significant abnormal Pap smears discussed. At this point patient prefers to be screened. 6. Health maintenance. No blood work done as patient reports this done at her primary physician's office. Follow up in one year, sooner as needed.     Anastasio Auerbach MD, 9:35 AM 09/05/2014

## 2014-09-05 NOTE — Patient Instructions (Signed)
You may obtain a copy of any labs that were done today by logging onto MyChart as outlined in the instructions provided with your AVS (after visit summary). The office will not call with normal lab results but certainly if there are any significant abnormalities then we will contact you.   Health Maintenance, Female A healthy lifestyle and preventative care can promote health and wellness.  Maintain regular health, dental, and eye exams.  Eat a healthy diet. Foods like vegetables, fruits, whole grains, low-fat dairy products, and lean protein foods contain the nutrients you need without too many calories. Decrease your intake of foods high in solid fats, added sugars, and salt. Get information about a proper diet from your caregiver, if necessary.  Regular physical exercise is one of the most important things you can do for your health. Most adults should get at least 150 minutes of moderate-intensity exercise (any activity that increases your heart rate and causes you to sweat) each week. In addition, most adults need muscle-strengthening exercises on 2 or more days a week.   Maintain a healthy weight. The body mass index (BMI) is a screening tool to identify possible weight problems. It provides an estimate of body fat based on height and weight. Your caregiver can help determine your BMI, and can help you achieve or maintain a healthy weight. For adults 20 years and older:  A BMI below 18.5 is considered underweight.  A BMI of 18.5 to 24.9 is normal.  A BMI of 25 to 29.9 is considered overweight.  A BMI of 30 and above is considered obese.  Maintain normal blood lipids and cholesterol by exercising and minimizing your intake of saturated fat. Eat a balanced diet with plenty of fruits and vegetables. Blood tests for lipids and cholesterol should begin at age 61 and be repeated every 5 years. If your lipid or cholesterol levels are high, you are over 50, or you are a high risk for heart  disease, you may need your cholesterol levels checked more frequently.Ongoing high lipid and cholesterol levels should be treated with medicines if diet and exercise are not effective.  If you smoke, find out from your caregiver how to quit. If you do not use tobacco, do not start.  Lung cancer screening is recommended for adults aged 33 80 years who are at high risk for developing lung cancer because of a history of smoking. Yearly low-dose computed tomography (CT) is recommended for people who have at least a 30-pack-year history of smoking and are a current smoker or have quit within the past 15 years. A pack year of smoking is smoking an average of 1 pack of cigarettes a day for 1 year (for example: 1 pack a day for 30 years or 2 packs a day for 15 years). Yearly screening should continue until the smoker has stopped smoking for at least 15 years. Yearly screening should also be stopped for people who develop a health problem that would prevent them from having lung cancer treatment.  If you are pregnant, do not drink alcohol. If you are breastfeeding, be very cautious about drinking alcohol. If you are not pregnant and choose to drink alcohol, do not exceed 1 drink per day. One drink is considered to be 12 ounces (355 mL) of beer, 5 ounces (148 mL) of wine, or 1.5 ounces (44 mL) of liquor.  Avoid use of street drugs. Do not share needles with anyone. Ask for help if you need support or instructions about stopping  the use of drugs.  High blood pressure causes heart disease and increases the risk of stroke. Blood pressure should be checked at least every 1 to 2 years. Ongoing high blood pressure should be treated with medicines, if weight loss and exercise are not effective.  If you are 59 to 43 years old, ask your caregiver if you should take aspirin to prevent strokes.  Diabetes screening involves taking a blood sample to check your fasting blood sugar level. This should be done once every 3  years, after age 91, if you are within normal weight and without risk factors for diabetes. Testing should be considered at a younger age or be carried out more frequently if you are overweight and have at least 1 risk factor for diabetes.  Breast cancer screening is essential preventative care for women. You should practice "breast self-awareness." This means understanding the normal appearance and feel of your breasts and may include breast self-examination. Any changes detected, no matter how small, should be reported to a caregiver. Women in their 66s and 30s should have a clinical breast exam (CBE) by a caregiver as part of a regular health exam every 1 to 3 years. After age 101, women should have a CBE every year. Starting at age 100, women should consider having a mammogram (breast X-ray) every year. Women who have a family history of breast cancer should talk to their caregiver about genetic screening. Women at a high risk of breast cancer should talk to their caregiver about having an MRI and a mammogram every year.  Breast cancer gene (BRCA)-related cancer risk assessment is recommended for women who have family members with BRCA-related cancers. BRCA-related cancers include breast, ovarian, tubal, and peritoneal cancers. Having family members with these cancers may be associated with an increased risk for harmful changes (mutations) in the breast cancer genes BRCA1 and BRCA2. Results of the assessment will determine the need for genetic counseling and BRCA1 and BRCA2 testing.  The Pap test is a screening test for cervical cancer. Women should have a Pap test starting at age 57. Between ages 25 and 35, Pap tests should be repeated every 2 years. Beginning at age 37, you should have a Pap test every 3 years as long as the past 3 Pap tests have been normal. If you had a hysterectomy for a problem that was not cancer or a condition that could lead to cancer, then you no longer need Pap tests. If you are  between ages 50 and 76, and you have had normal Pap tests going back 10 years, you no longer need Pap tests. If you have had past treatment for cervical cancer or a condition that could lead to cancer, you need Pap tests and screening for cancer for at least 20 years after your treatment. If Pap tests have been discontinued, risk factors (such as a new sexual partner) need to be reassessed to determine if screening should be resumed. Some women have medical problems that increase the chance of getting cervical cancer. In these cases, your caregiver may recommend more frequent screening and Pap tests.  The human papillomavirus (HPV) test is an additional test that may be used for cervical cancer screening. The HPV test looks for the virus that can cause the cell changes on the cervix. The cells collected during the Pap test can be tested for HPV. The HPV test could be used to screen women aged 44 years and older, and should be used in women of any age  who have unclear Pap test results. After the age of 55, women should have HPV testing at the same frequency as a Pap test.  Colorectal cancer can be detected and often prevented. Most routine colorectal cancer screening begins at the age of 44 and continues through age 20. However, your caregiver may recommend screening at an earlier age if you have risk factors for colon cancer. On a yearly basis, your caregiver may provide home test kits to check for hidden blood in the stool. Use of a small camera at the end of a tube, to directly examine the colon (sigmoidoscopy or colonoscopy), can detect the earliest forms of colorectal cancer. Talk to your caregiver about this at age 86, when routine screening begins. Direct examination of the colon should be repeated every 5 to 10 years through age 13, unless early forms of pre-cancerous polyps or small growths are found.  Hepatitis C blood testing is recommended for all people born from [birth year] through 1964 and any  individual with known risks for hepatitis C.  Practice safe sex. Use condoms and avoid high-risk sexual practices to reduce the spread of sexually transmitted infections (STIs). Sexually active women aged 36 and younger should be checked for Chlamydia, which is a common sexually transmitted infection. Older women with new or multiple partners should also be tested for Chlamydia. Testing for other STIs is recommended if you are sexually active and at increased risk.  Osteoporosis is a disease in which the bones lose minerals and strength with aging. This can result in serious bone fractures. The risk of osteoporosis can be identified using a bone density scan. Women ages 20 and over and women at risk for fractures or osteoporosis should discuss screening with their caregivers. Ask your caregiver whether you should be taking a calcium supplement or vitamin D to reduce the rate of osteoporosis.  Menopause can be associated with physical symptoms and risks. Hormone replacement therapy is available to decrease symptoms and risks. You should talk to your caregiver about whether hormone replacement therapy is right for you.  Use sunscreen. Apply sunscreen liberally and repeatedly throughout the day. You should seek shade when your shadow is shorter than you. Protect yourself by wearing long sleeves, pants, a wide-brimmed hat, and sunglasses year round, whenever you are outdoors.  Notify your caregiver of new moles or changes in moles, especially if there is a change in shape or color. Also notify your caregiver if a mole is larger than the size of a pencil eraser.  Stay current with your immunizations. Document Released: 11/16/2010 Document Revised: 08/28/2012 Document Reviewed: 11/16/2010 [facility] Patient Information 2014 [employer].

## 2014-09-05 NOTE — Addendum Note (Signed)
Addended by: Nelva Nay on: 09/05/2014 09:45 AM   Modules accepted: Orders

## 2014-09-06 LAB — URINALYSIS W MICROSCOPIC + REFLEX CULTURE
Bacteria, UA: NONE SEEN
Bilirubin Urine: NEGATIVE
CRYSTALS: NONE SEEN
Casts: NONE SEEN
Glucose, UA: >1000 mg/dL — AB
Ketones, ur: NEGATIVE mg/dL
Leukocytes, UA: NEGATIVE
Nitrite: NEGATIVE
Protein, ur: >300 mg/dL — AB
SPECIFIC GRAVITY, URINE: 1.02 (ref 1.005–1.030)
UROBILINOGEN UA: 0.2 mg/dL (ref 0.0–1.0)
pH: 7 (ref 5.0–8.0)

## 2014-09-06 LAB — CYTOLOGY - PAP

## 2014-09-09 ENCOUNTER — Other Ambulatory Visit: Payer: Self-pay | Admitting: Gynecology

## 2014-09-09 MED ORDER — FLUCONAZOLE 150 MG PO TABS: 150.0000 mg | ORAL_TABLET | Freq: Once | ORAL | Status: DC

## 2014-11-10 ENCOUNTER — Encounter (HOSPITAL_COMMUNITY): Payer: Self-pay | Admitting: Emergency Medicine

## 2014-11-10 ENCOUNTER — Emergency Department (INDEPENDENT_AMBULATORY_CARE_PROVIDER_SITE_OTHER)
Admission: EM | Admit: 2014-11-10 | Discharge: 2014-11-10 | Disposition: A | Discharge status: Home / Self Care | Payer: Managed Care, Other (non HMO) | Source: Home / Self Care | Attending: Emergency Medicine | Admitting: Emergency Medicine

## 2014-11-10 ENCOUNTER — Emergency Department (INDEPENDENT_AMBULATORY_CARE_PROVIDER_SITE_OTHER): Payer: Managed Care, Other (non HMO)

## 2014-11-10 DIAGNOSIS — M546 Pain in thoracic spine: Secondary | ICD-10-CM

## 2014-11-10 DIAGNOSIS — R350 Frequency of micturition: Secondary | ICD-10-CM

## 2014-11-10 LAB — POCT URINALYSIS DIP (DEVICE)
BILIRUBIN URINE: NEGATIVE
Glucose, UA: 500 mg/dL — AB
Ketones, ur: NEGATIVE mg/dL
LEUKOCYTES UA: NEGATIVE
Nitrite: NEGATIVE
Specific Gravity, Urine: >=1.03 (ref 1.005–1.030)
Urobilinogen, UA: 0.2 mg/dL (ref 0.0–1.0)
pH: 7 (ref 5.0–8.0)

## 2014-11-10 LAB — POCT I-STAT, CHEM 8
BUN: 25 mg/dL — ABNORMAL HIGH (ref 6–20)
Calcium, Ion: 1.23 mmol/L (ref 1.12–1.23)
Chloride: 103 mmol/L (ref 101–111)
Creatinine, Ser: 1.8 mg/dL — ABNORMAL HIGH (ref 0.44–1.00)
Glucose, Bld: 211 mg/dL — ABNORMAL HIGH (ref 65–99)
HEMATOCRIT: 35 % — AB (ref 36.0–46.0)
Hemoglobin: 11.9 g/dL — ABNORMAL LOW (ref 12.0–15.0)
POTASSIUM: 4.1 mmol/L (ref 3.5–5.1)
Sodium: 142 mmol/L (ref 135–145)
TCO2: 27 mmol/L (ref 0–100)

## 2014-11-10 LAB — POCT PREGNANCY, URINE: Preg Test, Ur: NEGATIVE

## 2014-11-10 MED ORDER — CYCLOBENZAPRINE HCL 10 MG PO TABS: 10.0000 mg | ORAL_TABLET | Freq: Three times a day (TID) | ORAL | Status: DC | PRN

## 2014-11-10 MED ORDER — TRAMADOL HCL 50 MG PO TABS: 50.0000 mg | ORAL_TABLET | Freq: Four times a day (QID) | ORAL | Status: DC | PRN

## 2014-11-10 NOTE — Discharge Instructions (Signed)
I think your back pain is coming from a muscle. Take Flexeril 3 times a day as needed for pain. This medicine will make you sleepy. Use the tramadol every 6-8 hours as needed for severe pain. Do not drive while taking this medicine. You can also take Tylenol every 8 hours. Avoid NSAIDs, like ibuprofen, Motrin, Aleve, Advil, Naprosyn, as these are bad for your kidneys. Please follow-up with your primary care doctor in 1-2 weeks.

## 2014-11-10 NOTE — ED Provider Notes (Signed)
CSN: [id number]     Arrival date & time 11/10/14  1554 History   First MD Initiated Contact with Patient 11/10/14 1608     Chief Complaint  Patient presents with  . Back Pain   (Consider location/radiation/quality/duration/timing/severity/associated sxs/prior Treatment) HPI  She is a 44 year old woman here for evaluation of neck pain. She states this is been on and off for the last month, but has gotten significantly worse in the last few days. She reports a pain in the middle of her back into the right side. She states some days she is fine without any pain at all. However, other days she will have severe sharp pains, particularly when standing up from sitting. The pain is also worse when she leans to the right. No change with deep breaths. She does not feel short of breath. No pain radiating into her legs. No bowel or bladder incontinence. No numbness, tingling, weakness in the lower extremities.  She denies any injury or trauma. She states she is urinating a little more frequently, but denies any dysuria. She is a known diabetic and her blood sugars have been in the 200s the last few days. She states she also has stage III kidney disease.  She reports some swelling in her legs, but attributes this to her job. She has been taking ibuprofen 800 mg once to twice a day for this pain.  Past Medical History  Diagnosis Date  . Diabetes mellitus   . High triglycerides   . Hypertension   . Hypercholesteremia    Past Surgical History  Procedure Laterality Date  . Cesarean section      X 2  . Pelvic laparoscopy    . Carpal tunnel release      X 2  . Oophorectomy      BSO  . Abdominal hysterectomy  FEB 2002    TAH/BSO for irregular bleeding and leiomyoma  . Eye surgery      Retina reattachment   Family History  Problem Relation Age of Onset  . Diabetes Mother   . Hypertension Mother   . Diabetes Father   . Hypertension Father   . Cancer Father     STOMACH  . Stroke Maternal Grandmother    . Stroke Maternal Grandfather    History  Substance Use Topics  . Smoking status: Never Smoker   . Smokeless tobacco: Not on file  . Alcohol Use: 0.0 oz/week    0 Standard drinks or equivalent per week     Comment: occasional   OB History    Gravida Para Term Preterm AB TAB SAB Ectopic Multiple Living   5 2 2  3     2      Review of Systems As in history of present illness Allergies  Review of patient's allergies indicates no known allergies.  Home Medications   Prior to Admission medications   Medication Sig Start Date End Date Taking? Authorizing Provider  estradiol (ESTRACE) 2 MG tablet Take 1 tablet (2 mg total) by mouth daily. 09/05/14  Yes Anastasio Auerbach, MD  insulin aspart (NOVOLOG) 100 UNIT/ML injection Inject 30-40 units 3 times a day 05/14/13  Yes Elayne Snare, MD  rosuvastatin (CRESTOR) 20 MG tablet Take 20 mg by mouth daily.   Yes Historical Provider, MD  cyclobenzaprine (FLEXERIL) 10 MG tablet Take 1 tablet (10 mg total) by mouth 3 (three) times daily as needed for muscle spasms. 11/10/14   Melony Overly, MD  fexofenadine (ALLEGRA) 30 MG tablet  Take 30 mg by mouth 2 (two) times daily.      Historical Provider, MD  fluconazole (DIFLUCAN) 150 MG tablet Take 1 tablet (150 mg total) by mouth once. 09/09/14   Anastasio Auerbach, MD  gabapentin (NEURONTIN) 300 MG capsule Take 300 mg by mouth 3 (three) times daily.    Historical Provider, MD  Insulin Degludec 200 UNIT/ML SOPN Inject into the skin.    Historical Provider, MD  Insulin Degludec 200 UNIT/ML SOPN Inject into the skin.    Historical Provider, MD  Insulin Pen Needle 32G X 4 MM MISC Inject 1 each into the skin 5 (five) times daily. 02/19/13   Elayne Snare, MD  Insulin Syringe-Needle U-100 (INSULIN SYRINGE .5CC/31GX5/16") 31G X 5/16" 0.5 ML MISC Inject 1 each into the skin 4 (four) times daily. 02/15/13   Elayne Snare, MD  mometasone (NASONEX) 50 MCG/ACT nasal spray Place 2 sprays into the nose daily. 12/25/13   Julianne Rice, MD  Olmesartan-Amlodipine-HCTZ (TRIBENZOR PO) Take by mouth.      Historical Provider, MD  Jonetta Speak LANCETS [id number] [place]  11/29/12   Historical Provider, MD  traMADol (ULTRAM) 50 MG tablet Take 1 tablet (50 mg total) by mouth every 6 (six) hours as needed. 11/10/14   Melony Overly, MD   BP 174/98 mmHg  Pulse 109  Temp(Src) 99.1 F (37.3 C) (Oral)  Resp 18  SpO2 98% Physical Exam  Constitutional: She is oriented to person, place, and time. She appears well-developed and well-nourished. She appears distressed (with exam).  Cardiovascular:  Mild tachycardia  Pulmonary/Chest: Effort normal.  Musculoskeletal:       Back:  Back: No erythema or edema. No vertebral tenderness or step-offs. Mild muscle spasm in the right mid back. CVA tenderness on the right side.  Neurological: She is alert and oriented to person, place, and time.    ED Course  Procedures (including critical care time) Labs Review Labs Reviewed  POCT URINALYSIS DIP (DEVICE) - Abnormal; Notable for the following:    Glucose, UA 500 (*)    Hgb urine dipstick MODERATE (*)    Protein, ur >=300 (*)    All other components within normal limits  POCT I-STAT, CHEM 8 - Abnormal; Notable for the following:    BUN 25 (*)    Creatinine, Ser 1.80 (*)    Glucose, Bld 211 (*)    Hemoglobin 11.9 (*)    HCT 35.0 (*)    All other components within normal limits  URINE CULTURE  POCT PREGNANCY, URINE    Imaging Review Dg Abd 1 View  11/10/2014   CLINICAL DATA:  Right-sided back pain for 1 month, and known hematuria.  EXAM: ABDOMEN - 1 VIEW  COMPARISON:  None.  FINDINGS: Scattered large and small bowel gas is noted. Multiple phleboliths are noted throughout the pelvis as well as along the course of the ovarian veins bilaterally. No definitive renal or ureteral calculi are seen.  IMPRESSION: No definitive renal or ureteral calculi are seen. Multiple phleboliths are noted. If clinically indicated a CT urogram may be helpful  for further evaluation.   Electronically Signed   By: Inez Catalina M.D.   On: 11/10/2014 17:02     MDM   1. Right-sided thoracic back pain    No definitive kidney stone on KUB. I suspect this is muscular pain. I sent a urine culture to rule out infection. Symptomatic treatment with Flexeril and tramadol. Instructed her to avoid NSAIDs given her kidney  disease. She will follow-up with her PCP in 1-2 weeks.    Melony Overly, MD 11/10/14 [id number]

## 2014-11-10 NOTE — ED Notes (Signed)
C/o back pain onset 1 month... Hx of Stage 3 kidney disease.  Pain increases w/activity... Has noticed freq urination and having abd pain Denies hematuria, fevers, chills Slow steady gait No signs of acute distress.

## 2014-11-13 LAB — URINE CULTURE

## 2014-11-18 NOTE — ED Notes (Signed)
Final reports of STD screenings negative

## 2014-12-30 ENCOUNTER — Other Ambulatory Visit: Payer: Self-pay

## 2014-12-30 DIAGNOSIS — Z1231 Encounter for screening mammogram for malignant neoplasm of breast: Secondary | ICD-10-CM

## 2015-01-06 ENCOUNTER — Ambulatory Visit
Admission: RE | Admit: 2015-01-06 | Discharge: 2015-01-06 | Disposition: A | Discharge status: Home / Self Care | Payer: 59 | Source: Ambulatory Visit

## 2015-01-06 DIAGNOSIS — Z1231 Encounter for screening mammogram for malignant neoplasm of breast: Secondary | ICD-10-CM

## 2015-01-26 ENCOUNTER — Encounter (HOSPITAL_COMMUNITY): Payer: Self-pay | Admitting: Emergency Medicine

## 2015-01-26 ENCOUNTER — Emergency Department (HOSPITAL_COMMUNITY)
Admission: EM | Admit: 2015-01-26 | Discharge: 2015-01-26 | Disposition: A | Discharge status: Home / Self Care | Payer: 59 | Source: Home / Self Care | Attending: Family Medicine | Admitting: Family Medicine

## 2015-01-26 DIAGNOSIS — N390 Urinary tract infection, site not specified: Secondary | ICD-10-CM

## 2015-01-26 LAB — POCT URINALYSIS DIP (DEVICE)
Bilirubin Urine: NEGATIVE
Glucose, UA: 100 mg/dL — AB
Ketones, ur: NEGATIVE mg/dL
Nitrite: NEGATIVE
Protein, ur: >=300 mg/dL — AB
Specific Gravity, Urine: 1.025 (ref 1.005–1.030)
Urobilinogen, UA: 0.2 mg/dL (ref 0.0–1.0)
pH: 6.5 (ref 5.0–8.0)

## 2015-01-26 MED ORDER — CIPROFLOXACIN HCL 500 MG PO TABS
500.0000 mg | ORAL_TABLET | Freq: Two times a day (BID) | ORAL | Status: DC
Start: 2015-01-26 — End: 2015-03-18

## 2015-01-26 NOTE — ED Notes (Signed)
Patient noticed lower abdominal pressure last night.  Reports she felt like she needed to urinate, but couldn't.  Patient reports "problems with kidneys" that she says is stage 3 kidney disease.

## 2015-01-26 NOTE — Discharge Instructions (Signed)

## 2015-01-26 NOTE — ED Provider Notes (Signed)
CSN: [id number]     Arrival date & time 01/26/15  1259 History   First MD Initiated Contact with Patient 01/26/15 1326     Chief Complaint  Patient presents with  . Urinary Tract Infection   (Consider location/radiation/quality/duration/timing/severity/associated sxs/prior Treatment) Patient is a 44 y.o. female presenting with urinary tract infection. The history is provided by the patient.  Urinary Tract Infection Pain quality:  Burning Pain severity:  Moderate Onset quality:  Sudden Duration:  1 day Timing:  Intermittent Progression:  Waxing and waning Chronicity:  New Recent urinary tract infections: no   Relieved by:  Nothing Worsened by:  Nothing tried Ineffective treatments:  None tried   Past Medical History  Diagnosis Date  . Diabetes mellitus   . High triglycerides   . Hypertension   . Hypercholesteremia    Past Surgical History  Procedure Laterality Date  . Cesarean section      X 2  . Pelvic laparoscopy    . Carpal tunnel release      X 2  . Oophorectomy      BSO  . Abdominal hysterectomy  FEB 2002    TAH/BSO for irregular bleeding and leiomyoma  . Eye surgery      Retina reattachment   Family History  Problem Relation Age of Onset  . Diabetes Mother   . Hypertension Mother   . Diabetes Father   . Hypertension Father   . Cancer Father     STOMACH  . Stroke Maternal Grandmother   . Stroke Maternal Grandfather    Social History  Substance Use Topics  . Smoking status: Never Smoker   . Smokeless tobacco: Not on file  . Alcohol Use: 0.0 oz/week    0 Standard drinks or equivalent per week     Comment: occasional   OB History    Gravida Para Term Preterm AB TAB SAB Ectopic Multiple Living   5 2 2  3     2      Review of Systems  Constitutional: Negative.   HENT: Negative.   Eyes: Negative.   Respiratory: Negative.   Cardiovascular: Negative.   Gastrointestinal: Negative.   Genitourinary: Positive for dysuria and difficulty urinating.     Allergies  Review of patient's allergies indicates no known allergies.  Home Medications   Prior to Admission medications   Medication Sig Start Date End Date Taking? Authorizing Provider  cyclobenzaprine (FLEXERIL) 10 MG tablet Take 1 tablet (10 mg total) by mouth 3 (three) times daily as needed for muscle spasms. 11/10/14   Melony Overly, MD  estradiol (ESTRACE) 2 MG tablet Take 1 tablet (2 mg total) by mouth daily. 09/05/14   Anastasio Auerbach, MD  fexofenadine (ALLEGRA) 30 MG tablet Take 30 mg by mouth 2 (two) times daily.      Historical Provider, MD  fluconazole (DIFLUCAN) 150 MG tablet Take 1 tablet (150 mg total) by mouth once. 09/09/14   Anastasio Auerbach, MD  gabapentin (NEURONTIN) 300 MG capsule Take 300 mg by mouth 3 (three) times daily.    Historical Provider, MD  insulin aspart (NOVOLOG) 100 UNIT/ML injection Inject 30-40 units 3 times a day 05/14/13   Elayne Snare, MD  Insulin Degludec 200 UNIT/ML SOPN Inject into the skin.    Historical Provider, MD  Insulin Degludec 200 UNIT/ML SOPN Inject into the skin.    Historical Provider, MD  Insulin Pen Needle 32G X 4 MM MISC Inject 1 each into the skin 5 (five) times  daily. 02/19/13   Elayne Snare, MD  Insulin Syringe-Needle U-100 (INSULIN SYRINGE .5CC/31GX5/16") 31G X 5/16" 0.5 ML MISC Inject 1 each into the skin 4 (four) times daily. 02/15/13   Elayne Snare, MD  mometasone (NASONEX) 50 MCG/ACT nasal spray Place 2 sprays into the nose daily. 12/25/13   Julianne Rice, MD  Olmesartan-Amlodipine-HCTZ (TRIBENZOR PO) Take by mouth.      Historical Provider, MD  Jonetta Speak LANCETS [id number] [place]  11/29/12   Historical Provider, MD  rosuvastatin (CRESTOR) 20 MG tablet Take 20 mg by mouth daily.    Historical Provider, MD  traMADol (ULTRAM) 50 MG tablet Take 1 tablet (50 mg total) by mouth every 6 (six) hours as needed. 11/10/14   Melony Overly, MD   Meds Ordered and Administered this Visit  Medications - No data to display  BP 183/83 mmHg   Pulse 115  Temp(Src) 98.7 F (37.1 C) (Oral)  Resp 16  SpO2 96% No data found.   Physical Exam  Constitutional: She is oriented to person, place, and time. She appears well-developed and well-nourished.  HENT:  Head: Normocephalic and atraumatic.  Eyes: Conjunctivae and EOM are normal. Pupils are equal, round, and reactive to light.  Abdominal: Soft. There is no tenderness.  Neurological: She is alert and oriented to person, place, and time.  Nursing note and vitals reviewed.  No flank pain or tenderness ED Course  Procedures (including critical care time)  Labs Review Labs Reviewed  POCT URINALYSIS DIP (DEVICE) - Abnormal; Notable for the following:    Glucose, UA 100 (*)    Hgb urine dipstick MODERATE (*)    Protein, ur >=300 (*)    Leukocytes, UA TRACE (*)    All other components within normal limits     MDM   This chart was scribed in my presence and reviewed by me personally.    ICD-9-CM ICD-10-CM   1. UTI (lower urinary tract infection) 599.0 N39.0 ciprofloxacin (CIPRO) 500 MG tablet    Urine culture ordered Signed, Robyn Haber, MD   Robyn Haber, MD 01/26/15 1351

## 2015-01-28 LAB — URINE CULTURE: Culture: >=100000

## 2015-01-31 ENCOUNTER — Encounter: Payer: Self-pay | Admitting: Family Medicine

## 2015-03-14 ENCOUNTER — Telehealth: Payer: Self-pay | Admitting: *Deleted

## 2015-03-14 NOTE — Telephone Encounter (Signed)
Harvey.  Pt. has pending appt. with Dr. Brett Fairy, and Dr. Keturah Barre. would like to know if pt. has had a sleep study somewhere--has not had one here at [facility],  If she has had a sleep study at another facility, we need for her to bring a copy of that study with her to her appt. with Dr. Lavonia Dana

## 2015-03-14 NOTE — Telephone Encounter (Signed)
Patient returned call

## 2015-03-14 NOTE — Telephone Encounter (Signed)
Patient returned call, patient did not have sleep study elsewhere. Patient states that she told Vaughan Basta when scheduling this appointment that she had not done a sleep study before.

## 2015-03-14 NOTE — Telephone Encounter (Signed)
noted.  Dr. Brett Fairy aware/fim

## 2015-03-18 ENCOUNTER — Ambulatory Visit (INDEPENDENT_AMBULATORY_CARE_PROVIDER_SITE_OTHER): Payer: 59 | Admitting: Neurology

## 2015-03-18 ENCOUNTER — Telehealth: Payer: Self-pay | Admitting: Neurology

## 2015-03-18 ENCOUNTER — Encounter: Payer: Self-pay | Admitting: Neurology

## 2015-03-18 VITALS — BP 118/80 | HR 96 | Resp 20 | Ht 63.0 in | Wt 250.0 lb

## 2015-03-18 DIAGNOSIS — G4719 Other hypersomnia: Secondary | ICD-10-CM

## 2015-03-18 DIAGNOSIS — E0829 Diabetes mellitus due to underlying condition with other diabetic kidney complication: Secondary | ICD-10-CM

## 2015-03-18 DIAGNOSIS — G4710 Hypersomnia, unspecified: Secondary | ICD-10-CM

## 2015-03-18 DIAGNOSIS — G4730 Sleep apnea, unspecified: Secondary | ICD-10-CM

## 2015-03-18 DIAGNOSIS — N183 Chronic kidney disease, stage 3 unspecified: Secondary | ICD-10-CM

## 2015-03-18 DIAGNOSIS — G4700 Insomnia, unspecified: Secondary | ICD-10-CM | POA: Diagnosis not present

## 2015-03-18 MED ORDER — ALPRAZOLAM 0.5 MG PO TABS: 0.5000 mg | ORAL_TABLET | Freq: Every evening | ORAL | Status: DC | PRN

## 2015-03-18 MED ORDER — MODAFINIL 200 MG PO TABS: 200.0000 mg | ORAL_TABLET | Freq: Every day | ORAL | Status: DC

## 2015-03-18 NOTE — Patient Instructions (Signed)
Modafinil tablets What is this medicine? MODAFINIL (moe DAF i nil) is used to treat excessive sleepiness caused by certain sleep disorders. This includes narcolepsy, sleep apnea, and shift work sleep disorder. This medicine may be used for other purposes; ask your health care provider or pharmacist if you have questions. What should I tell my health care provider before I take this medicine? They need to know if you have any of these conditions: -history of depression, mania, or other mental disorder -kidney disease -liver disease -an unusual or allergic reaction to modafinil, other medicines, foods, dyes, or preservatives -pregnant or trying to get pregnant -breast-feeding How should I use this medicine? Take this medicine by mouth with a glass of water. Follow the directions on the prescription label. Take your doses at regular intervals. Do not take your medicine more often than directed. Do not stop taking except on your doctor's advice. A special MedGuide will be given to you by the pharmacist with each prescription and refill. Be sure to read this information carefully each time. Talk to your pediatrician regarding the use of this medicine in children. This medicine is not approved for use in children. Overdosage: If you think you have taken too much of this medicine contact a poison control center or emergency room at once. NOTE: This medicine is only for you. Do not share this medicine with others. What if I miss a dose? If you miss a dose, take it as soon as you can. If it is almost time for your next dose, take only that dose. Do not take double or extra doses. What may interact with this medicine? Do not take this medicine with any of the following medications: -amphetamine or dextroamphetamine -dexmethylphenidate or methylphenidate -medicines called MAO Inhibitors like Nardil, Parnate, Marplan, Eldepryl -pemoline -procarbazine This medicine may also interact with the following  medications: -antifungal medicines like itraconazole or ketoconazole -barbiturates like phenobarbital -birth control pills or other hormone-containing birth control devices or implants -carbamazepine -cyclosporine -diazepam -medicines for depression, anxiety, or psychotic disturbances -phenytoin -propranolol -triazolam -warfarin This list may not describe all possible interactions. Give your health care provider a list of all the medicines, herbs, non-prescription drugs, or dietary supplements you use. Also tell them if you smoke, drink alcohol, or use illegal drugs. Some items may interact with your medicine. What should I watch for while using this medicine? Visit your doctor or health care professional for regular checks on your progress. The full effects of this medicine may not be seen right away. This medicine may affect your concentration, function, or may hide signs that you are tired. You may get dizzy. Do not drive, use machinery, or do anything that needs mental alertness until you know how this drug affects you. Alcohol can make you more dizzy and may interfere with your response to this medicine or your alertness. Avoid alcoholic drinks. Birth control pills may not work properly while you are taking this medicine. Talk to your doctor about using an extra method of birth control. It is unknown if the effects of this medicine will be increased by the use of caffeine. Caffeine is available in many foods, beverages, and medications. Ask your doctor if you should limit or change your intake of caffeine-containing products while on this medicine. What side effects may I notice from receiving this medicine? Side effects that you should report to your doctor or health care professional as soon as possible: -allergic reactions like skin rash, itching or hives, swelling of the face,   lips, or tongue -anxiety -breathing problems -chest pain -fast, irregular  heartbeat -hallucinations -increased blood pressure -redness, blistering, peeling or loosening of the skin, including inside the mouth -sore throat, fever, or chills -suicidal thoughts or other mood changes -tremors -vomiting Side effects that usually do not require medical attention (report to your doctor or health care professional if they continue or are bothersome): -headache -nausea, diarrhea, or stomach upset -nervousness -trouble sleeping This list may not describe all possible side effects. Call your doctor for medical advice about side effects. You may report side effects to FDA at 1-800-FDA-1088. Where should I keep my medicine? Keep out of the reach of children. This medicine can be abused. Keep your medicine in a safe place to protect it from theft. Do not share this medicine with anyone. Selling or giving away this medicine is dangerous and against the law. This medicine may cause accidental overdose and death if taken by other adults, children, or pets. Mix any unused medicine with a substance like cat litter or coffee grounds. Then throw the medicine away in a sealed container like a sealed bag or a coffee can with a lid. Do not use the medicine after the expiration date. Store at room temperature between 20 and 25 degrees C (68 and 77 degrees F). NOTE: This sheet is a summary. It may not cover all possible information. If you have questions about this medicine, talk to your doctor, pharmacist, or health care provider.    2016, Elsevier/Gold Standard. (2014-01-22 15:34:55)  

## 2015-03-18 NOTE — Telephone Encounter (Signed)
Patient is calling as her pharmacy states she needs an authorization for Rx nodafamil 200 mg. Phone number for Josem Kaufmann is [phone].  Thanks!

## 2015-03-18 NOTE — Telephone Encounter (Signed)
Ins has been contacted and provided with clinical info.  Request is under review Ref # [id number]

## 2015-03-18 NOTE — Progress Notes (Signed)
SLEEP MEDICINE CLINIC   Provider:  Larey Seat, M D  Referring Provider: Glendale Chard, MD Primary Care Physician:  Maximino Greenland, MD  Chief Complaint  Patient presents with  . New Patient (Initial Visit)    hard time initiating sleep, snores, wakes with headaches, very tired during the day, rm 10, alone    HPI:  Cassandra Campos is a 44 y.o. female , seen here as a referral from Dr. Baird Cancer for a sleep evaluation, the patient was given a prescription for lunesta.   Chief complaint according to patient : "Insomnia at night, sleepiness in daytime".  Cassandra Campos reports that she developed insomnia and in response to this excessive daytime sleepiness and fatigue. It is her concern about her work performance but made her bring this to medical attention. 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 She has noted that her productivity has suffered and that she makes more mistakes, she seems to be dozing off. She's not even aware that she is falling asleep but her desk neighbor has notched her , woken her. This has been very embarrassing to her. She only noticed a change from nighttime insomnia to daytime hyper insomnia about a month ago this has been an insidious onset and  fairly recent. Her husband has witnessed her to snore but has not commented about possible apnea.  Sleep habits are as follows: The patient lives with her husband and her daughter in a private home her oldest son has already left on his own. She usually goes to bed around 11 PM, only about 14 days ago as she stopped any kind of lites in her bedroom and of the TV is off. This has not helped her to fall asleep easier however. Her sleep latency is very hard to define for her. She is not aware when she falls asleep when she is awake she feels as if she is just in a drowsy dozing stage without ever entering real restorative, refreshing sleep. She is a very light sleeper and she also has to toss and turn to find a  comfortable sleep position. She's not sure how many hours of sleep she truly gets at night. Her sleep perception is that of very few hours at night perhaps 3 or 4. This is [name] that was recently prescribed she has been able to sleep for about 4-5 hours but she still believes her sleep latency to be about 1 or 2 hours. She does not recall her dreams she does not think that she has vivid dreams or nightmares she's not known to ever act out dreams. Nocturia 2 times a night.  She rises about 5:15 AM and  is very drowsy and fatigued. She wakes up with a dry mouth from time to time but she does not have morning headaches. She does not drink any caffeine in any form. She has to commute to work and starts working at 7 AM work through [employer] PM. When she comes home her daughter actually taped her falling asleep at about 4 4:30 PM. Recently over the last month that has no longer occurred where she was previously sleeping very easily she is now not drifting off in the afternoons.   Sleep medical history and family sleep history: The patient has retrognathia, her husband has witnessed her to snore. Social history:  No ETOH, No tobacco use . No caffeine.   Review of Systems: Out of a complete 14 system review, the patient complains of only the following  symptoms, and all other reviewed systems are negative.  Patient has no sleep paralysis, no dream intrusion no hypnagogic or hypnopompic hallucinations and no cataplexy. She suffered from stage III kidney disease, hypertension, diabetes, high cholesterol. She had a surgical history of C-section, carpal tunnel surgery, hysterectomy,  Review of systems is positive for insomnia, daytime sleepiness, snoring, anxiety, the feeling of decreased energy and high fatigue, not enough sleep at night, joint pain feeling hot and cold having swelling in both legs, itching, weight gain. Epworth score 10 , Fatigue severity score 54  , depression score; see below.    Social History     Social History  . Marital Status: Married    Spouse Name: N/A  . Number of Children: N/A  . Years of Education: N/A   Occupational History  . Not on file.   Social History Main Topics  . Smoking status: Never Smoker   . Smokeless tobacco: Not on file  . Alcohol Use: 0.0 oz/week    0 Standard drinks or equivalent per week     Comment: occasional  . Drug Use: No  . Sexual Activity: Yes    Birth Control/ Protection: Surgical     Comment: HYST-1st intercourse 44 yo-Fewer than 5 partners   Other Topics Concern  . Not on file   Social History Narrative    Family History  Problem Relation Age of Onset  . Diabetes Mother   . Hypertension Mother   . Diabetes Father   . Hypertension Father   . Cancer Father     STOMACH  . Stroke Maternal Grandmother   . Stroke Maternal Grandfather     Past Medical History  Diagnosis Date  . Diabetes mellitus   . High triglycerides   . Hypertension   . Hypercholesteremia     Past Surgical History  Procedure Laterality Date  . Cesarean section      X 2  . Pelvic laparoscopy    . Carpal tunnel release      X 2  . Oophorectomy      BSO  . Abdominal hysterectomy  FEB 2002    TAH/BSO for irregular bleeding and leiomyoma  . Eye surgery      Retina reattachment    Current Outpatient Prescriptions  Medication Sig Dispense Refill  . Cholecalciferol (VITAMIN D3) 5000 UNITS CAPS Take by mouth.    . estradiol (ESTRACE) 2 MG tablet Take 1 tablet (2 mg total) by mouth daily. 30 tablet 12  . gabapentin (NEURONTIN) 300 MG capsule Take 300 mg by mouth 3 (three) times daily.    . insulin aspart (NOVOLOG) 100 UNIT/ML injection Inject 30-40 units 3 times a day 4 vial 3  . Insulin Degludec 200 UNIT/ML SOPN Inject into the skin.    . Insulin Degludec 200 UNIT/ML SOPN Inject into the skin.    . Insulin Pen Needle 32G X 4 MM MISC Inject 1 each into the skin 5 (five) times daily. 150 each 5  . Insulin Syringe-Needle U-100 (INSULIN SYRINGE  .5CC/31GX5/16") 31G X 5/16" 0.5 ML MISC Inject 1 each into the skin 4 (four) times daily. 150 each 5  . Olmesartan-Amlodipine-HCTZ (TRIBENZOR PO) Take by mouth.      Glory Rosebush DELICA LANCETS [id number] MISC     . rosuvastatin (CRESTOR) 20 MG tablet Take 20 mg by mouth daily.    Jabier Gauss 40-10-25 MG TABS      No current facility-administered medications for this visit.    Allergies as of  03/18/2015  . (No Known Allergies)    Vitals: BP 118/80 mmHg  Pulse 96  Resp 20  Ht 5\' 3"  (1.6 m)  Wt 250 lb (113.399 kg)  BMI 44.30 kg/m2 Last Weight:  Wt Readings from Last 1 Encounters:  03/18/15 250 lb (113.399 kg)   [id number] mass index is 44.3 kg/(m^2).     Last Height:   Ht Readings from Last 1 Encounters:  03/18/15 5\' 3"  (1.6 m)    Physical exam:  General: The patient is awake, alert and appears not in acute distress. The patient is well groomed. Head: Normocephalic, atraumatic. Neck is supple. Mallampati 4 ,  neck circumference:17 . Nasal airflow unrestricted  TMJ not  evident . Retrognathia is seen.  Cardiovascular:  Regular rate and rhythm , without  murmurs or carotid bruit, and without distended neck veins. Respiratory: Lungs are clear to auscultation. Skin: Ankle edema , no rash Trunk: BMI is elevated  The patient's posture is erect.   Neurologic exam : The patient is awake and alert, oriented to place and time.   Memory subjective described as intact.  Attention span & concentration ability appears normal.  Speech is fluent,  without  dysarthria, dysphonia or aphasia.  Mood and affect are appropriate.  Cranial nerves: Pupils are equal and briskly reactive to light. Funduscopic exam without evidence of pallor or edema.  Extraocular movements  in vertical and horizontal planes intact and without nystagmus. Visual fields by finger perimetry are intact. Hearing to finger rub intact.   Facial sensation intact to fine touch.  Facial motor strength is symmetric and tongue and  uvula move midline. Shoulder shrug was symmetrical.   Motor exam:  Normal tone, muscle bulk and symmetric strength in all extremities. The patient's grip strength is a little lower than expected or weaker. She reports that she sometimes has trouble opening jars etc.  Sensory:  Fine touch, pinprick and vibration were tested in all extremities. Proprioception tested in the upper extremities was normal. Vibration sense is preserved.to the ankle, these are swollen however.  Coordination: Rapid alternating movements in the fingers/hands was normal. Finger-to-nose maneuver  normal without evidence of ataxia, dysmetria or tremor.  Gait and station: Patient walks without assistive device and is able unassisted to climb up to the exam table. Strength within normal limits.  Stance is stable and normal.  Toe and heel stand were tested .Tandem gait is unfragmented. Turns with  3  Steps. Romberg testing is negative.  Deep tendon reflexes: in the  upper and lower extremities are symmetric and intact. Babinski maneuver response is downgoing.  The patient was advised of the nature of the diagnosed sleep disorder , the treatment options and risks for general a health and wellness arising from not treating the condition.  I spent more than 55 minutes RV time and  face to face time with the patient. Greater than 50% of time was spent in counseling and coordination of care. We have discussed the diagnosis and differential and I answered the patient's questions.  The co-morbidities as listed under ROS and PMhx are significant contributors to her OSA risk and are  fatigue inducing. She takes a Vit D supplement. She is unsure that TSH was recently checked.   Assessment:  After physical and neurologic examination, review of laboratory studies,  Personal review of imaging studies, reports of other /same  Imaging studies ,neurophysiology testing and pre-existing records as far as provided in visit., my assessment is   1)  the patient  has a very high-grade Mallampati and a larger neck circumference at 18 inches, her BMI has reached the morbidly obese category. She is at very high risk for obstructive sleep apnea. I wonder if her form of apnea manifests and insomnia. I am hesitant to prescribe a sleep aid that could be muscle relaxing and thereby increasing the degree of apnea and snoring. Snoring has already been witnessed. I think that Cassandra Campos will need to undergo a split night polysomnography ASAP.  2) hypersomnia - This PSG  is also necessary to preserve her job performance and in the end to keep employment. The excessive daytime sleepiness that she reported is very concerning she is dozing off without being aware which could affect her ability to operate machinery and certainly to operate a car.  3) morbidly obese, but without symptoms of hypoventilation. An exercise regimen and a low carb diet are recommended. For some patients it's beneficial to join [employer] or another program with a keep  To lose weight promise in form of a contract for  weight loss.  4)The patient does not regularly wake up this morning headaches but she is excessively fatigued perhaps more than actually sleepy. Her fatigue severity score was endorsed at 54 points with a very high her Epworth sleepiness score at 10 points. The comorbidities of diabetes, can contribute significantly to daytime fatigue. Same is true for her stage III kidney disease which also is fatigue inducing. Her hemoglobin A1c was at 8.7, her BUN at 33 her creatinine at 2.13. Other metabolic panels were normal range. I did not find a TSH. I encouraged her to continue taking vitamin D I would actually like her to take a prenatal vitamin with vitamin D, folate acid and a little bit of iron this can help restore her energy levels. She denies depression symptoms and she hasn't had no symptoms of cataplexy or narcolepsy.  5) Nocturia twice at night could also be related to  obstructive sleep apnea or diabetes with glucosuria.    Plan:  Treatment plan and additional workup :  I will order a split night polysomnography , Co2 measures can be added if available. My main concern is hypoxemia rather than CO2 retention in this patient with fatigue but not morning headaches or cluster headaches at night. I will print out a calorie counting schedule and an exercise suggestion for the patient. The key factor will be weight loss. I suspect strongly that we will find obstructive sleep apnea in this patient and it depends on the degree of apnea identified if a CPAP machine will be recommended or a dental device etc. Revisit after sleep study. In the meantime I will prescribe a medication that will help her to stay alert at work and drive safely. I would choose modafinil for this matter. Modafinil is less likely to elevated blood pressure in comparison to traditional stimulants.      Asencion Partridge Yuri Fana MD  03/18/2015   CC: Glendale Chard, [place] [address], [place] [id number]

## 2015-03-19 NOTE — Telephone Encounter (Signed)
[facility]) has approved the request for coverage on Modafinil effective until 06/18/2015 Ref # [id number] I called the patient, but got no answer.  I have notified the pharmacy of approval.  They will notify patient when Rx is ready for pick up.

## 2015-03-19 NOTE — Telephone Encounter (Signed)
Pt called inquiring if provider could recommend something OTC until RX is approved. Please call and advise at [phone] before 5

## 2015-03-19 NOTE — Telephone Encounter (Signed)
I attempted to call the patient back to inquire about if she wants an OTC to keep her awake? The modafinil auth should come back within 5 business days.  I called the number listed, and no answer, and it's voicemail was for a person that is not the patient, therefore, I didn't leave a message.

## 2015-03-19 NOTE — Telephone Encounter (Signed)
Again, I tried the number listed below, and no answer, and I did not leave a message. I called pt's cell and left a message asking her to call me back. Dr. Brett Fairy does not have any OTC suggestions other than energy drinks.

## 2015-03-20 NOTE — Telephone Encounter (Signed)
Pt called , she was told there were no suggestions from Dr. Brett Fairy other than energy drinks and she was also notified about her Rx  Modafinil. She expressed understanding

## 2015-04-17 ENCOUNTER — Other Ambulatory Visit: Payer: Self-pay

## 2015-04-17 ENCOUNTER — Other Ambulatory Visit: Payer: Self-pay | Admitting: Nephrology

## 2015-04-17 DIAGNOSIS — N183 Chronic kidney disease, stage 3 unspecified: Secondary | ICD-10-CM

## 2015-04-17 MED ORDER — ESTRADIOL 2 MG PO TABS: 2.0000 mg | ORAL_TABLET | Freq: Every day | ORAL | Status: DC

## 2015-04-22 ENCOUNTER — Ambulatory Visit (INDEPENDENT_AMBULATORY_CARE_PROVIDER_SITE_OTHER): Payer: 59 | Admitting: Gynecology

## 2015-04-22 ENCOUNTER — Encounter: Payer: Self-pay | Admitting: Gynecology

## 2015-04-22 VITALS — BP 140/80 | Ht 63.0 in | Wt 250.0 lb

## 2015-04-22 DIAGNOSIS — N644 Mastodynia: Secondary | ICD-10-CM

## 2015-04-22 NOTE — Patient Instructions (Signed)
Stop your estrogen for a week or so. Call if the breast tenderness persists over the next several weeks and will arrange for an ultrasound possible repeat mammogram. Assuming the symptoms resolve then follow expectantly and call if any issues in the future.

## 2015-04-22 NOTE — Progress Notes (Signed)
Cassandra Campos 04-Sep-1970 [id number]        44 y.o.  [id number] Presents with approximately 1 week of a lateral breast tenderness left greater than right. Seems most prominent in the tail of Spence on both sides. No palpable abnormalities on self-exam or nipple discharge. Currently on ERT of Estrace 2 mg daily status post TAH/BSO in the past. Recent mammography normal August 2016. Class C density noted.  Past medical history,surgical history, problem list, medications, allergies, family history and social history were all reviewed and documented in the EPIC chart.  Directed ROS with pertinent positives and negatives documented in the history of present illness/assessment and plan.  Exam: Wandra Scot assistant Filed Vitals:   04/22/15 1405  BP: 140/80  Height: 5\' 3"  (1.6 m)  Weight: 250 lb (113.399 kg)   General appearance:  Normal Both breast examined lying and sitting without masses retractions discharge adenopathy.  Assessment/Plan:  44 y.o. [id number] with bilateral mastalgia left greater than right. Recommend patient stop her ERT for a week or so and then restart it and see how she does. If her pain continue she knows to call we'll pursue a more involved evaluation. If it totally resolves and she'll follow expectantly. I reviewed the classification systems as far as density with mammograms.   Anastasio Auerbach MD, 2:31 PM 04/22/2015

## 2015-04-23 ENCOUNTER — Ambulatory Visit: Payer: 59 | Admitting: Gynecology

## 2015-04-25 ENCOUNTER — Other Ambulatory Visit: Payer: 59

## 2015-04-28 ENCOUNTER — Telehealth: Payer: Self-pay | Admitting: Neurology

## 2015-04-28 NOTE — Telephone Encounter (Signed)
200 mg modafinil is the maximum dose.  The sleep study can be made with a payment plan?

## 2015-04-28 NOTE — Telephone Encounter (Signed)
Pt called and wanted to know if she has gotten used to her medication, she is starting to get sleepy again during the day. modafinil (PROVIGIL) 200 MG tablet. Pt also says she was told she needed a sleep study , she can not afford it right now. Please call and advise [phone] work, [phone] cell. Please call work number first.

## 2015-04-28 NOTE — Telephone Encounter (Signed)
Spoke to pt. She cannot afford a sleep study at this time to rule out osa as recommended by Dr. Brett Fairy. This sleep study was supposed to be done ASAP.  Pt says she is starting to fall asleep again during the day and is wondering if the modafinil dosage needs to be increased. She is currently taking 200 mg daily. I advised her that we would really like for her to undergo a sleep study to find out what is causing her excessive sleepiness during the day, but in the meantime, I will sends this message to Dr. Brett Fairy to see what she recommends for now.  I advised pt that I would call her back with Dr. Edwena Felty recommendations. Pt verbalized understanding.

## 2015-04-29 ENCOUNTER — Telehealth: Payer: Self-pay | Admitting: *Deleted

## 2015-04-29 DIAGNOSIS — N644 Mastodynia: Secondary | ICD-10-CM

## 2015-04-29 NOTE — Telephone Encounter (Signed)
She was complaining of bilateral left greater than right discomfort. Assuming that her right discomfort has resolved but she now has persistent left discomfort I would recommend scheduling a diagnostic left mammography and ultrasound reference persistent left breast pain

## 2015-04-29 NOTE — Telephone Encounter (Signed)
Spoke to pt and advised her that the modafinil 200mg  daily is the maximum dose. Dr. Brett Fairy mentioned a payment plan for the sleep study. I explained to pt that we need to have this sleep study done as soon as possible. Pt asked to speak to the sleep lab staff to discuss payment plans and how much this sleep study will cost.

## 2015-04-29 NOTE — Telephone Encounter (Signed)
Pt informed with the below note states she has left breast has increased, orders will be placed. Pt will call breast center to schedule.

## 2015-04-29 NOTE — Telephone Encounter (Signed)
Pt called to follow up from [place] on 04/22/15 pt said she never started back on estradiol 2 mg because left breast discomfort never resolved. Pt said the discomfort has now moved toward the nipple. Was told to call if continues. Please advise

## 2015-04-29 NOTE — Telephone Encounter (Signed)
Appointment 05/09/15 @ 3:50pm

## 2015-04-29 NOTE — Telephone Encounter (Signed)
Pt called back. She can be reached today at [phone] until 12:30 and after 1:30. From 12:30 - 1:30 [phone]

## 2015-04-30 ENCOUNTER — Ambulatory Visit
Admission: RE | Admit: 2015-04-30 | Discharge: 2015-04-30 | Disposition: A | Discharge status: Home / Self Care | Payer: 59 | Source: Ambulatory Visit | Attending: Nephrology | Admitting: Nephrology

## 2015-04-30 DIAGNOSIS — N183 Chronic kidney disease, stage 3 unspecified: Secondary | ICD-10-CM

## 2015-05-09 ENCOUNTER — Ambulatory Visit
Admission: RE | Admit: 2015-05-09 | Discharge: 2015-05-09 | Disposition: A | Discharge status: Home / Self Care | Payer: 59 | Source: Ambulatory Visit | Attending: Gynecology | Admitting: Gynecology

## 2015-05-09 ENCOUNTER — Other Ambulatory Visit: Payer: Self-pay

## 2015-05-09 DIAGNOSIS — N644 Mastodynia: Secondary | ICD-10-CM

## 2015-06-02 ENCOUNTER — Other Ambulatory Visit: Payer: Self-pay

## 2015-06-02 DIAGNOSIS — N185 Chronic kidney disease, stage 5: Secondary | ICD-10-CM

## 2015-06-02 DIAGNOSIS — Z01810 Encounter for preprocedural cardiovascular examination: Secondary | ICD-10-CM

## 2015-06-18 ENCOUNTER — Encounter: Payer: Self-pay | Admitting: Vascular Surgery

## 2015-06-24 ENCOUNTER — Ambulatory Visit (INDEPENDENT_AMBULATORY_CARE_PROVIDER_SITE_OTHER)
Admission: RE | Admit: 2015-06-24 | Discharge: 2015-06-24 | Disposition: A | Discharge status: Home / Self Care | Payer: 59 | Source: Ambulatory Visit | Attending: Vascular Surgery | Admitting: Vascular Surgery

## 2015-06-24 ENCOUNTER — Ambulatory Visit (INDEPENDENT_AMBULATORY_CARE_PROVIDER_SITE_OTHER): Payer: Medicaid Other | Admitting: Vascular Surgery

## 2015-06-24 ENCOUNTER — Encounter: Payer: Self-pay | Admitting: Vascular Surgery

## 2015-06-24 ENCOUNTER — Ambulatory Visit (HOSPITAL_COMMUNITY)
Admission: RE | Admit: 2015-06-24 | Discharge: 2015-06-24 | Disposition: A | Discharge status: Home / Self Care | Payer: 59 | Source: Ambulatory Visit | Attending: Vascular Surgery | Admitting: Vascular Surgery

## 2015-06-24 VITALS — BP 140/87 | HR 90 | Ht 63.0 in | Wt 261.0 lb

## 2015-06-24 DIAGNOSIS — N185 Chronic kidney disease, stage 5: Secondary | ICD-10-CM | POA: Diagnosis not present

## 2015-06-24 DIAGNOSIS — E7800 Pure hypercholesterolemia, unspecified: Secondary | ICD-10-CM | POA: Diagnosis not present

## 2015-06-24 DIAGNOSIS — I120 Hypertensive chronic kidney disease with stage 5 chronic kidney disease or end stage renal disease: Secondary | ICD-10-CM | POA: Insufficient documentation

## 2015-06-24 DIAGNOSIS — N184 Chronic kidney disease, stage 4 (severe): Secondary | ICD-10-CM

## 2015-06-24 DIAGNOSIS — Z01810 Encounter for preprocedural cardiovascular examination: Secondary | ICD-10-CM

## 2015-06-24 DIAGNOSIS — E1122 Type 2 diabetes mellitus with diabetic chronic kidney disease: Secondary | ICD-10-CM | POA: Insufficient documentation

## 2015-06-24 NOTE — Progress Notes (Signed)
Vascular and Vein Specialist of [facility]  Patient name: Cassandra Campos MRN: [id number] DOB: February 28, 1971 Sex: female  REASON FOR CONSULT: Consideration for hemodialysis access  HPI: Cassandra Campos is a 45 y.o. female, who is seen today for discussion of long-term hemodialysis access. She has chronic renal insufficiency stage IV. She is right-handed. She has never required hemodialysis in the past.  Past Medical History  Diagnosis Date  . Diabetes mellitus   . High triglycerides   . Hypertension   . Hypercholesteremia   . Anemia   . Chronic kidney disease     Family History  Problem Relation Age of Onset  . Diabetes Mother   . Hypertension Mother   . Diabetes Father   . Hypertension Father   . Cancer Father     STOMACH  . Stroke Maternal Grandmother   . Stroke Maternal Grandfather     SOCIAL HISTORY: Social History   Social History  . Marital Status: Married    Spouse Name: N/A  . Number of Children: N/A  . Years of Education: N/A   Occupational History  . Not on file.   Social History Main Topics  . Smoking status: Never Smoker   . Smokeless tobacco: Not on file  . Alcohol Use: 0.0 oz/week    0 Standard drinks or equivalent per week     Comment: occasional  . Drug Use: No  . Sexual Activity: Yes    Birth Control/ Protection: Surgical     Comment: HYST-1st intercourse 45 yo-Fewer than 5 partners   Other Topics Concern  . Not on file   Social History Narrative    No Known Allergies  Current Outpatient Prescriptions  Medication Sig Dispense Refill  . ALPRAZolam (XANAX) 0.5 MG tablet Take 1 tablet (0.5 mg total) by mouth at bedtime as needed for anxiety. 2 tablet 0  . Cholecalciferol (VITAMIN D3) 5000 UNITS CAPS Take by mouth.    . estradiol (ESTRACE) 2 MG tablet Take 1 tablet (2 mg total) by mouth daily. 90 tablet 1  . furosemide (LASIX) 20 MG tablet Take 20 mg by mouth daily.    Marland Kitchen gabapentin (NEURONTIN) 300 MG capsule Take 300 mg by mouth 3  (three) times daily.    . insulin aspart (NOVOLOG) 100 UNIT/ML injection Inject 30-40 units 3 times a day 4 vial 3  . Insulin Degludec (TRESIBA FLEXTOUCH) 200 UNIT/ML SOPN Inject into the skin.    . Insulin Syringe-Needle U-100 (INSULIN SYRINGE .5CC/31GX5/16") 31G X 5/16" 0.5 ML MISC Inject 1 each into the skin 4 (four) times daily. 150 each 5  . modafinil (PROVIGIL) 200 MG tablet Take 1 tablet (200 mg total) by mouth daily. 30 tablet 2  . Olmesartan-Amlodipine-HCTZ (TRIBENZOR PO) Take by mouth.      Glory Rosebush DELICA LANCETS [id number] MISC     . rosuvastatin (CRESTOR) 20 MG tablet Take 20 mg by mouth daily.    Jabier Gauss 40-10-25 MG TABS     . Insulin Degludec 200 UNIT/ML SOPN Inject into the skin. Reported on 06/24/2015    . Insulin Degludec 200 UNIT/ML SOPN Inject into the skin. Reported on 06/24/2015    . Insulin Pen Needle 32G X 4 MM MISC Inject 1 each into the skin 5 (five) times daily. (Patient not taking: Reported on 06/24/2015) 150 each 5   No current facility-administered medications for this visit.    REVIEW OF SYSTEMS:  [X]  denotes positive finding, [ ]  denotes negative finding Cardiac  Comments:  Chest  pain or chest pressure: x   Shortness of breath upon exertion:    Short of breath when lying flat:    Irregular heart rhythm:        Vascular    Pain in calf, thigh, or hip brought on by ambulation: x   Pain in feet at night that wakes you up from your sleep:     Blood clot in your veins:    Leg swelling:  x       Pulmonary    Oxygen at home:    Productive cough:     Wheezing:         Neurologic    Sudden weakness in arms or legs:     Sudden numbness in arms or legs:     Sudden onset of difficulty speaking or slurred speech:    Temporary loss of vision in one eye:     Problems with dizziness:         Gastrointestinal    Blood in stool:     Vomited blood:         Genitourinary    Burning when urinating:     Blood in urine:        Psychiatric    Major depression:           Hematologic    Bleeding problems:    Problems with blood clotting too easily:        Skin    Rashes or ulcers:        Constitutional    Fever or chills:      PHYSICAL EXAM: Filed Vitals:   06/24/15 0934 06/24/15 0942  BP: 148/95 140/87  Pulse: 90   Height: 5\' 3"  (1.6 m)   Weight: 261 lb (118.389 kg)   SpO2: 100%     GENERAL: The patient is a well-nourished female, in no acute distress. The vital signs are documented above. CARDIAC: There is a regular rate and rhythm.  VASCULAR: 2+ radial pulses bilaterally. She does have 2+ dorsalis pedis pulses bilaterally. Has marked pitting edema in her distal calves and ankles bilaterally PULMONARY: There is good air exchange bilaterally without wheezing or rales. ABDOMEN: Soft and non-tender with normal pitched bowel sounds.  MUSCULOSKELETAL: There are no major deformities or cyanosis. NEUROLOGIC: No focal weakness or paresthesias are detected. SKIN: There are no ulcers or rashes noted. PSYCHIATRIC: The patient has a normal affect.  DATA:  Noninvasive vascular laboratory studies reveal patency cephalic vein throughout its course bilaterally. This does appear to be of adequate size for an upper arm left cephalic vein fistula. She might be a candidate for forearm fistula although the diameter of the cephalic vein in the forearm is marginal.  MEDICAL ISSUES: Had long discussion with patient regarding accessed for hemodialysis. Discussed dialysis access catheters, AV fistulas and AV grafts. Discussed the benefits and disadvantages of all of these. Have recommended the left cephalic vein fistula and would make determination as to upper or lower arm depending on SonoSite evaluation surgery. She understands and will schedule this electively at her convenience as an outpatient   Dreux Mcgroarty [facility] [employer]: [phone]

## 2015-06-30 ENCOUNTER — Other Ambulatory Visit: Payer: Self-pay

## 2015-07-01 ENCOUNTER — Telehealth: Payer: Self-pay | Admitting: Vascular Surgery

## 2015-07-01 NOTE — Telephone Encounter (Signed)
I called the patient about her Attending Provider Statement received by fax from the [place].  The patient did not want to pay the $29 right now and I explained we would only be taking her out of work on the day of the procedure.  Per [employer], she can return to work the day after  as long as she does not do any lifting.  Patient  asked that I hold on to the form in case she needs it later.

## 2015-07-03 ENCOUNTER — Encounter (HOSPITAL_COMMUNITY): Payer: Self-pay | Admitting: *Deleted

## 2015-07-03 MED ORDER — CHLORHEXIDINE GLUCONATE CLOTH 2 % EX PADS: 6.0000 | MEDICATED_PAD | Freq: Once | CUTANEOUS | Status: DC

## 2015-07-03 MED ORDER — DEXTROSE 5 % IV SOLN
1.5000 g | INTRAVENOUS | Status: AC
  Administered 2015-07-04: 1.5 g via INTRAVENOUS
  Filled 2015-07-03: qty 1.5

## 2015-07-03 MED ORDER — SODIUM CHLORIDE 0.9 % IV SOLN
INTRAVENOUS | Status: DC
  Administered 2015-07-04 (×2): via INTRAVENOUS

## 2015-07-03 NOTE — Progress Notes (Signed)
Pt denies cardiac history, chest pain or sob. Pt is diabetic, states last A1C was 9.1 about a month ago. States fasting blood sugar usually runs between 101 and 110. Have requested copy of EKG and A1C results from Dr. Baird Cancer office. Pt states that Dr. Luther Parody office instructed her to take 1/2 of her usual dose of Tresiba Insulin this evening. Instructed pt to check blood sugar in AM and if 70 or less instructed her to treat with 1/2 cup of clear juice (apple or cranberry) and to recheck her blood sugar 15 min later. These instructions given per Diabetes Medication Adjustment Guidelines Prior to Procedure and Surgery. Pt voiced understanding.

## 2015-07-04 ENCOUNTER — Encounter (HOSPITAL_COMMUNITY)
Admission: RE | Disposition: A | Discharge status: Home / Self Care | Payer: Self-pay | Source: Ambulatory Visit | Attending: Vascular Surgery

## 2015-07-04 ENCOUNTER — Ambulatory Visit (HOSPITAL_COMMUNITY)
Admission: RE | Admit: 2015-07-04 | Discharge: 2015-07-04 | Disposition: A | Discharge status: Home / Self Care | Payer: 59 | Source: Ambulatory Visit | Attending: Vascular Surgery | Admitting: Vascular Surgery

## 2015-07-04 ENCOUNTER — Ambulatory Visit (HOSPITAL_COMMUNITY): Payer: 59 | Admitting: Certified Registered Nurse Anesthetist

## 2015-07-04 ENCOUNTER — Encounter (HOSPITAL_COMMUNITY): Payer: Self-pay | Admitting: Certified Registered Nurse Anesthetist

## 2015-07-04 ENCOUNTER — Other Ambulatory Visit: Payer: Self-pay | Admitting: *Deleted

## 2015-07-04 DIAGNOSIS — E781 Pure hyperglyceridemia: Secondary | ICD-10-CM | POA: Diagnosis not present

## 2015-07-04 DIAGNOSIS — Z79899 Other long term (current) drug therapy: Secondary | ICD-10-CM | POA: Insufficient documentation

## 2015-07-04 DIAGNOSIS — E1142 Type 2 diabetes mellitus with diabetic polyneuropathy: Secondary | ICD-10-CM | POA: Insufficient documentation

## 2015-07-04 DIAGNOSIS — N186 End stage renal disease: Secondary | ICD-10-CM

## 2015-07-04 DIAGNOSIS — Z992 Dependence on renal dialysis: Secondary | ICD-10-CM | POA: Insufficient documentation

## 2015-07-04 DIAGNOSIS — D649 Anemia, unspecified: Secondary | ICD-10-CM | POA: Insufficient documentation

## 2015-07-04 DIAGNOSIS — I120 Hypertensive chronic kidney disease with stage 5 chronic kidney disease or end stage renal disease: Secondary | ICD-10-CM | POA: Diagnosis not present

## 2015-07-04 DIAGNOSIS — Z794 Long term (current) use of insulin: Secondary | ICD-10-CM | POA: Insufficient documentation

## 2015-07-04 DIAGNOSIS — Z4931 Encounter for adequacy testing for hemodialysis: Secondary | ICD-10-CM

## 2015-07-04 DIAGNOSIS — E7800 Pure hypercholesterolemia, unspecified: Secondary | ICD-10-CM | POA: Insufficient documentation

## 2015-07-04 DIAGNOSIS — E1122 Type 2 diabetes mellitus with diabetic chronic kidney disease: Secondary | ICD-10-CM | POA: Insufficient documentation

## 2015-07-04 HISTORY — PX: AV FISTULA PLACEMENT: SHX1204

## 2015-07-04 HISTORY — DX: Umbilical hernia without obstruction or gangrene: K42.9

## 2015-07-04 HISTORY — DX: Type 2 diabetes mellitus with unspecified diabetic retinopathy without macular edema: E11.319

## 2015-07-04 HISTORY — DX: Fatty (change of) liver, not elsewhere classified: K76.0

## 2015-07-04 LAB — GLUCOSE, CAPILLARY
Glucose-Capillary: 96 mg/dL (ref 65–99)
Glucose-Capillary: 114 mg/dL — ABNORMAL HIGH (ref 65–99)

## 2015-07-04 LAB — POCT I-STAT 4, (NA,K, GLUC, HGB,HCT)
Glucose, Bld: 105 mg/dL — ABNORMAL HIGH (ref 65–99)
HEMATOCRIT: 31 % — AB (ref 36.0–46.0)
HEMOGLOBIN: 10.5 g/dL — AB (ref 12.0–15.0)
POTASSIUM: 4 mmol/L (ref 3.5–5.1)
SODIUM: 142 mmol/L (ref 135–145)

## 2015-07-04 SURGERY — ARTERIOVENOUS (AV) FISTULA CREATION
Anesthesia: Monitor Anesthesia Care | Site: Arm Lower | Laterality: Left

## 2015-07-04 MED ORDER — PROPOFOL 500 MG/50ML IV EMUL
INTRAVENOUS | Status: DC | PRN
  Administered 2015-07-04: 150 ug/kg/min via INTRAVENOUS

## 2015-07-04 MED ORDER — FENTANYL CITRATE (PF) 100 MCG/2ML IJ SOLN
INTRAMUSCULAR | Status: DC | PRN
  Administered 2015-07-04: 50 ug via INTRAVENOUS

## 2015-07-04 MED ORDER — SODIUM CHLORIDE 0.9 % IV SOLN
INTRAVENOUS | Status: DC | PRN
  Administered 2015-07-04: 500 mL

## 2015-07-04 MED ORDER — PHENYLEPHRINE HCL 10 MG/ML IJ SOLN
INTRAMUSCULAR | Status: DC | PRN
  Administered 2015-07-04 (×2): 80 ug via INTRAVENOUS

## 2015-07-04 MED ORDER — MIDAZOLAM HCL 5 MG/5ML IJ SOLN
INTRAMUSCULAR | Status: DC | PRN
  Administered 2015-07-04 (×2): 1 mg via INTRAVENOUS

## 2015-07-04 MED ORDER — PROMETHAZINE HCL 25 MG/ML IJ SOLN: 6.2500 mg | INTRAMUSCULAR | Status: DC | PRN

## 2015-07-04 MED ORDER — OXYCODONE-ACETAMINOPHEN 5-325 MG PO TABS: 1.0000 | ORAL_TABLET | Freq: Four times a day (QID) | ORAL | Status: DC | PRN

## 2015-07-04 MED ORDER — FENTANYL CITRATE (PF) 250 MCG/5ML IJ SOLN
INTRAMUSCULAR | Status: AC
  Filled 2015-07-04: qty 5

## 2015-07-04 MED ORDER — FENTANYL CITRATE (PF) 100 MCG/2ML IJ SOLN: 25.0000 ug | INTRAMUSCULAR | Status: DC | PRN

## 2015-07-04 MED ORDER — 0.9 % SODIUM CHLORIDE (POUR BTL) OPTIME
TOPICAL | Status: DC | PRN
  Administered 2015-07-04: 1000 mL

## 2015-07-04 MED ORDER — LIDOCAINE-EPINEPHRINE (PF) 1 %-1:200000 IJ SOLN
INTRAMUSCULAR | Status: AC
  Filled 2015-07-04: qty 30

## 2015-07-04 MED ORDER — PROPOFOL 10 MG/ML IV BOLUS
INTRAVENOUS | Status: AC
  Filled 2015-07-04: qty 20

## 2015-07-04 MED ORDER — MIDAZOLAM HCL 2 MG/2ML IJ SOLN
INTRAMUSCULAR | Status: AC
  Filled 2015-07-04: qty 2

## 2015-07-04 MED ORDER — LIDOCAINE-EPINEPHRINE (PF) 1 %-1:200000 IJ SOLN
INTRAMUSCULAR | Status: DC | PRN
  Administered 2015-07-04: 26 mL

## 2015-07-04 SURGICAL SUPPLY — 26 items
ARMBAND PINK RESTRICT EXTREMIT (MISCELLANEOUS) ×3 IMPLANT
CANISTER SUCTION 2500CC (MISCELLANEOUS) ×3 IMPLANT
CLIP TI MEDIUM 6 (CLIP) ×3 IMPLANT
CLIP TI WIDE RED SMALL 6 (CLIP) ×6 IMPLANT
COVER PROBE W GEL 5X96 (DRAPES) IMPLANT
DRAIN PENROSE 1/4X12 LTX STRL (WOUND CARE) IMPLANT
ELECT REM PT RETURN 9FT ADLT (ELECTROSURGICAL) ×3
ELECTRODE REM PT RTRN 9FT ADLT (ELECTROSURGICAL) ×1 IMPLANT
GEL ULTRASOUND 20GR AQUASONIC (MISCELLANEOUS) IMPLANT
GLOVE BIOGEL PI IND STRL 6.5 (GLOVE) ×2 IMPLANT
GLOVE BIOGEL PI INDICATOR 6.5 (GLOVE) ×4
GLOVE SS BIOGEL STRL SZ 7 (GLOVE) ×1 IMPLANT
GLOVE SUPERSENSE BIOGEL SZ 7 (GLOVE) ×2
GOWN STRL REUS W/ TWL LRG LVL3 (GOWN DISPOSABLE) ×3 IMPLANT
GOWN STRL REUS W/TWL LRG LVL3 (GOWN DISPOSABLE) ×6
KIT BASIN OR (CUSTOM PROCEDURE TRAY) ×3 IMPLANT
KIT ROOM TURNOVER OR (KITS) ×3 IMPLANT
LIQUID BAND (GAUZE/BANDAGES/DRESSINGS) ×3 IMPLANT
NS IRRIG 1000ML POUR BTL (IV SOLUTION) ×3 IMPLANT
PACK CV ACCESS (CUSTOM PROCEDURE TRAY) ×3 IMPLANT
PAD ARMBOARD 7.5X6 YLW CONV (MISCELLANEOUS) IMPLANT
SUT PROLENE 6 0 BV (SUTURE) ×3 IMPLANT
SUT VIC AB 3-0 SH 27 (SUTURE) ×2
SUT VIC AB 3-0 SH 27X BRD (SUTURE) ×1 IMPLANT
UNDERPAD 30X30 INCONTINENT (UNDERPADS AND DIAPERS) ×3 IMPLANT
WATER STERILE IRR 1000ML POUR (IV SOLUTION) IMPLANT

## 2015-07-04 NOTE — H&P (View-Only) (Signed)
Vascular and Vein Specialist of [facility]  Patient name: Cassandra Campos MRN: [id number] DOB: 03/10/1971 Sex: female  REASON FOR CONSULT: Consideration for hemodialysis access  HPI: Cassandra Campos is a 45 y.o. female, who is seen today for discussion of long-term hemodialysis access. She has chronic renal insufficiency stage IV. She is right-handed. She has never required hemodialysis in the past.  Past Medical History  Diagnosis Date  . Diabetes mellitus   . High triglycerides   . Hypertension   . Hypercholesteremia   . Anemia   . Chronic kidney disease     Family History  Problem Relation Age of Onset  . Diabetes Mother   . Hypertension Mother   . Diabetes Father   . Hypertension Father   . Cancer Father     STOMACH  . Stroke Maternal Grandmother   . Stroke Maternal Grandfather     SOCIAL HISTORY: Social History   Social History  . Marital Status: Married    Spouse Name: N/A  . Number of Children: N/A  . Years of Education: N/A   Occupational History  . Not on file.   Social History Main Topics  . Smoking status: Never Smoker   . Smokeless tobacco: Not on file  . Alcohol Use: 0.0 oz/week    0 Standard drinks or equivalent per week     Comment: occasional  . Drug Use: No  . Sexual Activity: Yes    Birth Control/ Protection: Surgical     Comment: HYST-1st intercourse 45 yo-Fewer than 5 partners   Other Topics Concern  . Not on file   Social History Narrative    No Known Allergies  Current Outpatient Prescriptions  Medication Sig Dispense Refill  . ALPRAZolam (XANAX) 0.5 MG tablet Take 1 tablet (0.5 mg total) by mouth at bedtime as needed for anxiety. 2 tablet 0  . Cholecalciferol (VITAMIN D3) 5000 UNITS CAPS Take by mouth.    . estradiol (ESTRACE) 2 MG tablet Take 1 tablet (2 mg total) by mouth daily. 90 tablet 1  . furosemide (LASIX) 20 MG tablet Take 20 mg by mouth daily.    Marland Kitchen gabapentin (NEURONTIN) 300 MG capsule Take 300 mg by mouth 3  (three) times daily.    . insulin aspart (NOVOLOG) 100 UNIT/ML injection Inject 30-40 units 3 times a day 4 vial 3  . Insulin Degludec (TRESIBA FLEXTOUCH) 200 UNIT/ML SOPN Inject into the skin.    . Insulin Syringe-Needle U-100 (INSULIN SYRINGE .5CC/31GX5/16") 31G X 5/16" 0.5 ML MISC Inject 1 each into the skin 4 (four) times daily. 150 each 5  . modafinil (PROVIGIL) 200 MG tablet Take 1 tablet (200 mg total) by mouth daily. 30 tablet 2  . Olmesartan-Amlodipine-HCTZ (TRIBENZOR PO) Take by mouth.      Glory Rosebush DELICA LANCETS [id number] MISC     . rosuvastatin (CRESTOR) 20 MG tablet Take 20 mg by mouth daily.    Jabier Gauss 40-10-25 MG TABS     . Insulin Degludec 200 UNIT/ML SOPN Inject into the skin. Reported on 06/24/2015    . Insulin Degludec 200 UNIT/ML SOPN Inject into the skin. Reported on 06/24/2015    . Insulin Pen Needle 32G X 4 MM MISC Inject 1 each into the skin 5 (five) times daily. (Patient not taking: Reported on 06/24/2015) 150 each 5   No current facility-administered medications for this visit.    REVIEW OF SYSTEMS:  [X]  denotes positive finding, [ ]  denotes negative finding Cardiac  Comments:  Chest  pain or chest pressure: x   Shortness of breath upon exertion:    Short of breath when lying flat:    Irregular heart rhythm:        Vascular    Pain in calf, thigh, or hip brought on by ambulation: x   Pain in feet at night that wakes you up from your sleep:     Blood clot in your veins:    Leg swelling:  x       Pulmonary    Oxygen at home:    Productive cough:     Wheezing:         Neurologic    Sudden weakness in arms or legs:     Sudden numbness in arms or legs:     Sudden onset of difficulty speaking or slurred speech:    Temporary loss of vision in one eye:     Problems with dizziness:         Gastrointestinal    Blood in stool:     Vomited blood:         Genitourinary    Burning when urinating:     Blood in urine:        Psychiatric    Major depression:           Hematologic    Bleeding problems:    Problems with blood clotting too easily:        Skin    Rashes or ulcers:        Constitutional    Fever or chills:      PHYSICAL EXAM: Filed Vitals:   06/24/15 0934 06/24/15 0942  BP: 148/95 140/87  Pulse: 90   Height: 5\' 3"  (1.6 m)   Weight: 261 lb (118.389 kg)   SpO2: 100%     GENERAL: The patient is a well-nourished female, in no acute distress. The vital signs are documented above. CARDIAC: There is a regular rate and rhythm.  VASCULAR: 2+ radial pulses bilaterally. She does have 2+ dorsalis pedis pulses bilaterally. Has marked pitting edema in her distal calves and ankles bilaterally PULMONARY: There is good air exchange bilaterally without wheezing or rales. ABDOMEN: Soft and non-tender with normal pitched bowel sounds.  MUSCULOSKELETAL: There are no major deformities or cyanosis. NEUROLOGIC: No focal weakness or paresthesias are detected. SKIN: There are no ulcers or rashes noted. PSYCHIATRIC: The patient has a normal affect.  DATA:  Noninvasive vascular laboratory studies reveal patency cephalic vein throughout its course bilaterally. This does appear to be of adequate size for an upper arm left cephalic vein fistula. She might be a candidate for forearm fistula although the diameter of the cephalic vein in the forearm is marginal.  MEDICAL ISSUES: Had long discussion with patient regarding accessed for hemodialysis. Discussed dialysis access catheters, AV fistulas and AV grafts. Discussed the benefits and disadvantages of all of these. Have recommended the left cephalic vein fistula and would make determination as to upper or lower arm depending on SonoSite evaluation surgery. She understands and will schedule this electively at her convenience as an outpatient   Gabriellia Rempel [facility] [employer]: [phone]

## 2015-07-04 NOTE — Transfer of Care (Signed)
Immediate Anesthesia Transfer of Care Note  Patient: Cassandra Campos  Procedure(s) Performed: Procedure(s): ARTERIOVENOUS (AV) FISTULA CREATION-LEFT (Left)  Patient Location: PACU  Anesthesia Type:MAC  Level of Consciousness: awake, alert , oriented and patient cooperative  Airway & Oxygen Therapy: Patient Spontanous Breathing and Patient connected to nasal cannula oxygen  Post-op Assessment: Report given to RN and Post -op Vital signs reviewed and stable  Post vital signs: Reviewed and stable  Last Vitals:  Filed Vitals:   07/04/15 0629  BP: 138/81  Pulse: 96  Temp: 37.1 C  Resp: 18    Complications: No apparent anesthesia complications

## 2015-07-04 NOTE — Anesthesia Preprocedure Evaluation (Addendum)
Anesthesia Evaluation  Patient identified by MRN, date of birth, ID band Patient awake    Reviewed: Allergy & Precautions, NPO status , Patient's Chart, lab work & pertinent test results  Airway Mallampati: III  TM Distance: >3 FB Neck ROM: Full    Dental  (+) Teeth Intact, Dental Advisory Given   Pulmonary neg pulmonary ROS,    Pulmonary exam normal breath sounds clear to auscultation       Cardiovascular Exercise Tolerance: Poor hypertension, Pt. on medications (-) angina(-) CAD and (-) Past MI Normal cardiovascular exam Rhythm:Regular Rate:Normal     Neuro/Psych Neuropathy  negative psych ROS   GI/Hepatic negative GI ROS, Neg liver ROS,   Endo/Other  diabetes, Type 2, Insulin Dependent  Renal/GU Renal InsufficiencyRenal disease     Musculoskeletal negative musculoskeletal ROS (+)   Abdominal   Peds  Hematology  (+) Blood dyscrasia, anemia ,   Anesthesia Other Findings Day of surgery medications reviewed with the patient.  Reproductive/Obstetrics negative OB ROS                           Anesthesia Physical Anesthesia Plan  ASA: III  Anesthesia Plan: MAC   Post-op Pain Management:    Induction: Intravenous  Airway Management Planned: Nasal Cannula  Additional Equipment:   Intra-op Plan:   Post-operative Plan:   Informed Consent: I have reviewed the patients History and Physical, chart, labs and discussed the procedure including the risks, benefits and alternatives for the proposed anesthesia with the patient or authorized representative who has indicated his/her understanding and acceptance.   Dental advisory given  Plan Discussed with: CRNA and Anesthesiologist  Anesthesia Plan Comments: (Discussed risks/benefits/alternatives to MAC sedation including need for ventilatory support, hypotension, need for conversion to general anesthesia.  All patient questions answered.   Patient wished to proceed.)        Anesthesia Quick Evaluation

## 2015-07-04 NOTE — Anesthesia Postprocedure Evaluation (Signed)
Anesthesia Post Note  Patient: Cassandra Campos  Procedure(s) Performed: Procedure(s) (LRB): ARTERIOVENOUS (AV) FISTULA CREATION-LEFT (Left)  Patient location during evaluation: Endoscopy Anesthesia Type: MAC Level of consciousness: awake and alert Pain management: pain level controlled Vital Signs Assessment: post-procedure vital signs reviewed and stable Respiratory status: spontaneous breathing, nonlabored ventilation, respiratory function stable and patient connected to nasal cannula oxygen Cardiovascular status: stable and blood pressure returned to baseline Anesthetic complications: no    Last Vitals:  Filed Vitals:   07/04/15 0900 07/04/15 0915  BP: 115/64 114/68  Pulse: 97 98  Temp: 36.8 C   Resp: 17     Last Pain: There were no vitals filed for this visit.               Catalina Gravel

## 2015-07-04 NOTE — Op Note (Signed)
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 After infiltration with 1% Xylocaine with epinephrine short transverse incision was made and antecubital area. Antecubital vein dissected free. Cephalic branch was a good quality branch is at least 3 mm in size. It was ligated distally and transected preserving the basilic vein. It was gently dilated with an ionized saline and marked for orientation purposes. Brachial artery was then exposed beneath the fascia and circled with vessel loops. It had an excellent pulse. It was then occluded proximally and distally with Vesseloops O 15 blade extended with Potts scissors. There was excellent inflow. The vein was carefully measured spatulated and anastomosed inside with 6-0 Prolene. Vessel loops were released there was an excellent pulse and palpable thrill in the fistula. There was also good radial and ulnar flow distally which slightly decreased with the fistula open. Adequate hemostasis was achieved wound closed in layers with Vicryl subcuticular fashion with Dermabond patient taken to recovery room in satisfactory condition   Tinnie Gens, MD 07/04/2015 8:51 AM

## 2015-07-04 NOTE — Interval H&P Note (Signed)
History and Physical Interval Note:  07/04/2015 7:33 AM  Cassandra Campos  has presented today for surgery, with the diagnosis of End Stage Renal Disease  N18.6  The various methods of treatment have been discussed with the patient and family. After consideration of risks, benefits and other options for treatment, the patient has consented to  Procedure(s): ARTERIOVENOUS (AV) FISTULA CREATION-LEFT (Left) as a surgical intervention .  The patient's history has been reviewed, patient examined, no change in status, stable for surgery.  I have reviewed the patient's chart and labs.  Questions were answered to the patient's satisfaction.     Tinnie Gens

## 2015-07-06 ENCOUNTER — Encounter (HOSPITAL_COMMUNITY): Payer: Self-pay | Admitting: Nurse Practitioner

## 2015-07-06 ENCOUNTER — Emergency Department (HOSPITAL_COMMUNITY): Payer: 59

## 2015-07-06 ENCOUNTER — Emergency Department (HOSPITAL_COMMUNITY)
Admission: EM | Admit: 2015-07-06 | Discharge: 2015-07-06 | Disposition: A | Discharge status: Home / Self Care | Payer: 59 | Attending: Emergency Medicine | Admitting: Emergency Medicine

## 2015-07-06 DIAGNOSIS — Z8719 Personal history of other diseases of the digestive system: Secondary | ICD-10-CM | POA: Insufficient documentation

## 2015-07-06 DIAGNOSIS — N186 End stage renal disease: Secondary | ICD-10-CM | POA: Insufficient documentation

## 2015-07-06 DIAGNOSIS — E781 Pure hyperglyceridemia: Secondary | ICD-10-CM | POA: Insufficient documentation

## 2015-07-06 DIAGNOSIS — E7800 Pure hypercholesterolemia, unspecified: Secondary | ICD-10-CM | POA: Insufficient documentation

## 2015-07-06 DIAGNOSIS — Z862 Personal history of diseases of the blood and blood-forming organs and certain disorders involving the immune mechanism: Secondary | ICD-10-CM | POA: Insufficient documentation

## 2015-07-06 DIAGNOSIS — R350 Frequency of micturition: Secondary | ICD-10-CM | POA: Diagnosis not present

## 2015-07-06 DIAGNOSIS — T8249XA Other complication of vascular dialysis catheter, initial encounter: Secondary | ICD-10-CM | POA: Diagnosis present

## 2015-07-06 DIAGNOSIS — I120 Hypertensive chronic kidney disease with stage 5 chronic kidney disease or end stage renal disease: Secondary | ICD-10-CM | POA: Insufficient documentation

## 2015-07-06 DIAGNOSIS — E11319 Type 2 diabetes mellitus with unspecified diabetic retinopathy without macular edema: Secondary | ICD-10-CM | POA: Insufficient documentation

## 2015-07-06 DIAGNOSIS — Z794 Long term (current) use of insulin: Secondary | ICD-10-CM | POA: Diagnosis not present

## 2015-07-06 DIAGNOSIS — Y658 Other specified misadventures during surgical and medical care: Secondary | ICD-10-CM | POA: Diagnosis not present

## 2015-07-06 DIAGNOSIS — E669 Obesity, unspecified: Secondary | ICD-10-CM | POA: Diagnosis not present

## 2015-07-06 DIAGNOSIS — Z79899 Other long term (current) drug therapy: Secondary | ICD-10-CM | POA: Diagnosis not present

## 2015-07-06 DIAGNOSIS — R05 Cough: Secondary | ICD-10-CM | POA: Insufficient documentation

## 2015-07-06 DIAGNOSIS — Z79818 Long term (current) use of other agents affecting estrogen receptors and estrogen levels: Secondary | ICD-10-CM | POA: Diagnosis not present

## 2015-07-06 DIAGNOSIS — R509 Fever, unspecified: Secondary | ICD-10-CM | POA: Diagnosis not present

## 2015-07-06 LAB — COMPREHENSIVE METABOLIC PANEL
ALK PHOS: 39 U/L (ref 38–126)
ALT: 19 U/L (ref 14–54)
ANION GAP: 13 (ref 5–15)
AST: 32 U/L (ref 15–41)
Albumin: 3.1 g/dL — ABNORMAL LOW (ref 3.5–5.0)
BILIRUBIN TOTAL: 0.3 mg/dL (ref 0.3–1.2)
BUN: 23 mg/dL — ABNORMAL HIGH (ref 6–20)
CALCIUM: 9.2 mg/dL (ref 8.9–10.3)
CO2: 23 mmol/L (ref 22–32)
CREATININE: 2.2 mg/dL — AB (ref 0.44–1.00)
Chloride: 103 mmol/L (ref 101–111)
GFR calc non Af Amer: 26 mL/min — ABNORMAL LOW (ref >60)
GFR, EST AFRICAN AMERICAN: 30 mL/min — AB (ref >60)
Glucose, Bld: 108 mg/dL — ABNORMAL HIGH (ref 65–99)
Potassium: 4.2 mmol/L (ref 3.5–5.1)
SODIUM: 139 mmol/L (ref 135–145)
TOTAL PROTEIN: 7.1 g/dL (ref 6.5–8.1)

## 2015-07-06 LAB — CBC WITH DIFFERENTIAL/PLATELET
BASOS ABS: 0 10*3/uL (ref 0.0–0.1)
Basophils Relative: 1 %
Eosinophils Absolute: 0 10*3/uL (ref 0.0–0.7)
Eosinophils Relative: 1 %
HEMATOCRIT: 31.8 % — AB (ref 36.0–46.0)
HEMOGLOBIN: 10.3 g/dL — AB (ref 12.0–15.0)
LYMPHS PCT: 30 %
Lymphs Abs: 1.3 10*3/uL (ref 0.7–4.0)
MCH: 28.1 pg (ref 26.0–34.0)
MCHC: 32.4 g/dL (ref 30.0–36.0)
MCV: 86.9 fL (ref 78.0–100.0)
MONO ABS: 0.8 10*3/uL (ref 0.1–1.0)
Monocytes Relative: 19 %
NEUTROS ABS: 2.1 10*3/uL (ref 1.7–7.7)
NEUTROS PCT: 49 %
Platelets: 201 10*3/uL (ref 150–400)
RBC: 3.66 MIL/uL — ABNORMAL LOW (ref 3.87–5.11)
RDW: 12.9 % (ref 11.5–15.5)
WBC: 4.2 10*3/uL (ref 4.0–10.5)

## 2015-07-06 LAB — URINALYSIS, ROUTINE W REFLEX MICROSCOPIC
Bilirubin Urine: NEGATIVE
GLUCOSE, UA: NEGATIVE mg/dL
Ketones, ur: NEGATIVE mg/dL
LEUKOCYTES UA: NEGATIVE
Nitrite: NEGATIVE
PH: 6 (ref 5.0–8.0)
Protein, ur: >300 mg/dL — AB
SPECIFIC GRAVITY, URINE: 1.021 (ref 1.005–1.030)

## 2015-07-06 LAB — I-STAT CG4 LACTIC ACID, ED
Lactic Acid, Venous: 0.92 mmol/L (ref 0.5–2.0)
Lactic Acid, Venous: 0.73 mmol/L (ref 0.5–2.0)

## 2015-07-06 LAB — URINE MICROSCOPIC-ADD ON

## 2015-07-06 MED ORDER — SODIUM CHLORIDE 0.9 % IV BOLUS (SEPSIS)
1000.0000 mL | Freq: Once | INTRAVENOUS | Status: AC
  Administered 2015-07-06: 1000 mL via INTRAVENOUS

## 2015-07-06 MED ORDER — ACETAMINOPHEN 325 MG PO TABS
650.0000 mg | ORAL_TABLET | Freq: Once | ORAL | Status: AC
  Administered 2015-07-06: 650 mg via ORAL
  Filled 2015-07-06: qty 2

## 2015-07-06 NOTE — Discharge Instructions (Signed)
Your exam, labs are very reassuring today. Please follow-up with your doctor or vascular surgeon in the next couple of days for a wound recheck. You may continue taking Tylenol for fever and general discomfort. Return to ED for any new or worsening symptoms as we discussed.  Fever, Adult A fever is an increase in the body's temperature. It is usually defined as a temperature of 100F (38C) or higher. Brief mild or moderate fevers generally have no long-term effects, and they often do not require treatment. Moderate or high fevers may make you feel uncomfortable and can sometimes be a sign of a serious illness or disease. The sweating that may occur with repeated or prolonged fever may also cause dehydration. Fever is confirmed by taking a temperature with a thermometer. A measured temperature can vary with:  Age.  Time of day.  Location of the thermometer:  Mouth (oral).  Rectum (rectal).  Ear (tympanic).  Underarm (axillary).  Forehead (temporal). HOME CARE INSTRUCTIONS Pay attention to any changes in your symptoms. Take these actions to help with your condition:  Take over-the counter and prescription medicines only as told by your health care provider. Follow the dosing instructions carefully.  If you were prescribed an antibiotic medicine, take it as told by your health care provider. Do not stop taking the antibiotic even if you start to feel better.  Rest as needed.  Drink enough fluid to keep your urine clear or pale yellow. This helps to prevent dehydration.  Sponge yourself or bathe with room-temperature water to help reduce your body temperature as needed. Do not use ice water.  Do not overbundle yourself in blankets or heavy clothes. SEEK MEDICAL CARE IF:  You vomit.  You cannot eat or drink without vomiting.  You have diarrhea.  You have pain when you urinate.  Your symptoms do not improve with treatment.  You develop new symptoms.  You develop excessive  weakness. SEEK IMMEDIATE MEDICAL CARE IF:  You have shortness of breath or have trouble breathing.  You are dizzy or you faint.  You are disoriented or confused.  You develop signs of dehydration, such as a dry mouth, decreased urination, or paleness.  You develop severe pain in your abdomen.  You have persistent vomiting or diarrhea.  You develop a skin rash.  Your symptoms suddenly get worse.   This information is not intended to replace advice given to you by your health care provider. Make sure you discuss any questions you have with your health care provider.   Document Released: 10/27/2000 Document Revised: 01/22/2015 Document Reviewed: 06/27/2014 Elsevier Interactive Patient Education [employer].

## 2015-07-06 NOTE — ED Provider Notes (Signed)
CSN: [id number]     Arrival date & time 07/06/15  1054 History   First MD Initiated Contact with Patient 07/06/15 1130     Chief Complaint  Patient presents with  . Vascular Access Problem     (Consider location/radiation/quality/duration/timing/severity/associated sxs/prior Treatment) HPI Cassandra Campos is a 45 y.o. female with history of hypertension, hyperlipidemia, diabetes, end-stage renal disease, comes in for evaluation of fever. Patient reports on Friday she had a fistula placed in left arm. She reports generally feeling unwell over the past 3 days. She reports a fever at home of 102 over the weekend. 100.8 on arrival. She does report associated dry cough and urinary frequency. She denies hemoptysis, chest pain or shortness of breath, abdominal pain, nausea or vomiting, dysuria, other back pain, numbness or weakness.  Past Medical History  Diagnosis Date  . Diabetes mellitus   . High triglycerides   . Hypertension   . Hypercholesteremia   . Anemia   . Chronic kidney disease   . Umbilical hernia   . Neuropathy ([place])   . Fatty liver   . Diabetic retinopathy [facility])    Past Surgical History  Procedure Laterality Date  . Cesarean section      X 2  . Pelvic laparoscopy    . Carpal tunnel release      X 2  . Oophorectomy      BSO  . Abdominal hysterectomy  FEB 2002    TAH/BSO for irregular bleeding and leiomyoma  . Eye surgery Bilateral     Retina reattachment   Family History  Problem Relation Age of Onset  . Diabetes Mother   . Hypertension Mother   . Diabetes Father   . Hypertension Father   . Cancer Father     STOMACH  . Stroke Maternal Grandmother   . Stroke Maternal Grandfather    Social History  Substance Use Topics  . Smoking status: Never Smoker   . Smokeless tobacco: Never Used  . Alcohol Use: 0.0 oz/week    0 Standard drinks or equivalent per week     Comment: occasional   OB History    Gravida Para Term Preterm AB TAB SAB Ectopic Multiple  Living   5 2 2  3     2      Review of Systems A 10 point review of systems was completed and was negative except for pertinent positives and negatives as mentioned in the history of present illness     Allergies  Review of patient's allergies indicates no known allergies.  Home Medications   Prior to Admission medications   Medication Sig Start Date End Date Taking? Authorizing Provider  Cholecalciferol (VITAMIN D3) 5000 UNITS CAPS Take by mouth.   Yes Historical Provider, MD  estradiol (ESTRACE) 2 MG tablet Take 1 tablet (2 mg total) by mouth daily. 04/17/15  Yes Anastasio Auerbach, MD  furosemide (LASIX) 40 MG tablet Take 40 mg by mouth daily as needed for fluid.  06/17/15  Yes Historical Provider, MD  gabapentin (NEURONTIN) 300 MG capsule Take 300 mg by mouth at bedtime.    Yes Historical Provider, MD  insulin aspart (NOVOLOG) 100 UNIT/ML injection Inject 30-40 units 3 times a day Patient taking differently: Inject 30-40 Units into the skin 3 (three) times daily with meals. Inject 30-40 units 3 times a day 05/14/13  Yes Elayne Snare, MD  Insulin Degludec (TRESIBA FLEXTOUCH) 200 UNIT/ML SOPN Inject 120 Units into the skin at bedtime.    Yes Historical  Provider, MD  oxyCODONE-acetaminophen (PERCOCET/ROXICET) 5-325 MG tablet Take 1 tablet by mouth every 6 (six) hours as needed for moderate pain. 07/04/15  Yes Ulyses Amor, PA-C  PRESCRIPTION MEDICATION Take 1 tablet by mouth daily. Iron supplement   Yes Historical Provider, MD  Pseudoephedrine-APAP-DM (TYLENOL COLD/FLU SEVERE DAY PO) Take 1 tablet by mouth daily as needed (for cold and flu symptoms).   Yes Historical Provider, MD  rosuvastatin (CRESTOR) 20 MG tablet Take 20 mg by mouth daily.   Yes Historical Provider, MD  temazepam (RESTORIL) 30 MG capsule Take 30 mg by mouth at bedtime as needed for sleep.  05/30/15  Yes Historical Provider, MD  TRIBENZOR 40-10-25 MG TABS Take 1 tablet by mouth daily.  03/10/15  Yes Historical Provider, MD   XIIDRA 5 % SOLN Place 1 drop into both eyes 2 (two) times daily. 06/26/15  Yes Historical Provider, MD  Insulin Pen Needle 32G X 4 MM MISC Inject 1 each into the skin 5 (five) times daily. 02/19/13   Elayne Snare, MD  Insulin Syringe-Needle U-100 (INSULIN SYRINGE .5CC/31GX5/16") 31G X 5/16" 0.5 ML MISC Inject 1 each into the skin 4 (four) times daily. 02/15/13   Elayne Snare, MD  modafinil (PROVIGIL) 200 MG tablet Take 1 tablet (200 mg total) by mouth daily. Patient not taking: Reported on 07/02/2015 03/18/15   Larey Seat, MD  Jonetta Speak LANCETS [id number] [place]  11/[birth year]   Historical Provider, MD   BP 132/78 mmHg  Pulse 104  Temp(Src) 99.9 F (37.7 C) (Oral)  Resp 17  Ht 5' 2.5" (1.588 m)  Wt 114.562 kg  BMI 45.43 kg/m2  SpO2 97% Physical Exam  Constitutional: She is oriented to person, place, and time. She appears well-developed and well-nourished.  Obese African-American female  HENT:  Head: Normocephalic and atraumatic.  Mouth/Throat: Oropharynx is clear and moist.  Eyes: Conjunctivae are normal. Pupils are equal, round, and reactive to light. Right eye exhibits no discharge. Left eye exhibits no discharge. No scleral icterus.  Neck: Neck supple.  Cardiovascular: Normal rate, regular rhythm and normal heart sounds.   Pulmonary/Chest: Effort normal and breath sounds normal. No respiratory distress. She has no wheezes. She has no rales.  Abdominal: Soft. There is no tenderness.  Musculoskeletal: She exhibits no tenderness.  Neurological: She is alert and oriented to person, place, and time.  Cranial Nerves II-XII grossly intact  Skin: Skin is warm and dry. No rash noted.  Left arm fistula surgical site appears to be healing well. There is hematoma below the incision site. No surrounding erythema, edema, drainage or tenderness.  Psychiatric: She has a normal mood and affect.  Nursing note and vitals reviewed.   ED Course  Procedures (including critical care time) Labs Review Labs  Reviewed  COMPREHENSIVE METABOLIC PANEL - Abnormal; Notable for the following:    Glucose, Bld 108 (*)    BUN 23 (*)    Creatinine, Ser 2.20 (*)    Albumin 3.1 (*)    GFR calc non Af Amer 26 (*)    GFR calc Af Amer 30 (*)    All other components within normal limits  CBC WITH DIFFERENTIAL/PLATELET - Abnormal; Notable for the following:    RBC 3.66 (*)    Hemoglobin 10.3 (*)    HCT 31.8 (*)    All other components within normal limits  URINALYSIS, ROUTINE W REFLEX MICROSCOPIC (NOT AT [facility]) - Abnormal; Notable for the following:    Hgb urine dipstick LARGE (*)  Protein, ur >300 (*)    All other components within normal limits  URINE MICROSCOPIC-ADD ON - Abnormal; Notable for the following:    Squamous Epithelial / LPF 0-5 (*)    Bacteria, UA RARE (*)    All other components within normal limits  CULTURE, BLOOD (ROUTINE X 2)  CULTURE, BLOOD (ROUTINE X 2)  URINE CULTURE  I-STAT CG4 LACTIC ACID, ED  I-STAT CG4 LACTIC ACID, ED    Imaging Review Dg Chest 2 View  07/06/2015  CLINICAL DATA:  Fever, cough. Pt had fistula placed in left arm 2 days ago. Pt notices redness around fistula and has been c/o fever, productive cough nausea and headache since procedure. Pt temp in ED was 100.7 Hx diabetes and HTN controlled with medication. EXAM: CHEST  2 VIEW COMPARISON:  05/18/2011 FINDINGS: Heart size is normal. There is mild prominence of interstitial markings. There are no focal consolidations or pleural effusions. No pulmonary edema. Study quality is degraded by motion artifact on the lateral view. IMPRESSION: Prominent interstitial markings possibly related to viral illness. No focal acute pulmonary abnormality. Electronically Signed   By: Nolon Nations M.D.   On: 07/06/2015 13:15   I have personally reviewed and evaluated these images and lab results as part of my medical decision-making.   EKG Interpretation None     Meds given in ED:  Medications  sodium chloride 0.9 % bolus  1,000 mL (0 mLs Intravenous Stopped 07/06/15 1810)  acetaminophen (TYLENOL) tablet 650 mg (650 mg Oral Given 07/06/15 1428)  sodium chloride 0.9 % bolus 1,000 mL (0 mLs Intravenous Stopped 07/06/15 1911)    Discharge Medication List as of 07/06/2015  3:05 PM     Filed Vitals:   07/06/15 1700 07/06/15 1800 07/06/15 1830 07/06/15 1900  BP: 117/84 131/66 129/74 132/78  Pulse: 103 103 107 104  Temp:      TempSrc:      Resp: 14 14 21 17   Height:      Weight:      SpO2: 97% 97% 96% 97%    MDM  Cassandra Campos is a 45 y.o. female with history of hypertension, hyperlipidemia, diabetes, end-stage renal disease with recent fistula placed on 2/17, comes in for evaluation of generally not feeling well, fever, fatigue, cough. Arrival, she is slightly febrile at 100.2F, responds well to Tylenol. She is also tachycardic at 118 which also responds well to IV fluids. Physical exam is grossly unremarkable, surgical site appears well. CBC, CMP appear baseline and are unremarkable. Urine without any overt evidence of infection. Chest x-ray shows prominent interstitial markings possibly related to viral illness without any other acute pulmonary abnormality. Discussed with vascular surgery, Dr. Bridgett Larsson about possibility of surgical infection. He feels strongly that there is very low likelihood that the incision is infected due to native tissues and recent timing of procedure. Overall, patient appears well, nontoxic and hemodynamically stable. Low suspicion for PE as patient has no chest pain, shortness of breath or hypoxia. No evidence of other infection. Discussed she will need follow-up tomorrow with her PCP for recheck. Can use OTC medications/Tylenol as directed. Discussed return precautions and she verbalizes understanding and agrees with this plan. Overall states she feels much better and is ready to go home. Prior to patient discharge, I discussed and reviewed this case with Dr.Pfeiffer  Final diagnoses:   Fever, unspecified fever cause       Comer Locket, PA-C 07/07/15 [id number]  Charlesetta Shanks, MD 07/07/15 [phone]

## 2015-07-06 NOTE — ED Notes (Signed)
Pt left with all her belongings and ambulated out of the treatment area.  

## 2015-07-06 NOTE — ED Notes (Signed)
Pt c/o 2 day history of not feeling well, fevers, nausea, fatigue. Onset after having fistula placed in L arm. She noticed redness around fistula site today

## 2015-07-06 NOTE — ED Notes (Signed)
Pt given water, peanut butter, and graham crackers.  Family members given coffee.

## 2015-07-07 ENCOUNTER — Encounter (HOSPITAL_COMMUNITY): Payer: Self-pay | Admitting: Vascular Surgery

## 2015-07-07 ENCOUNTER — Telehealth: Payer: Self-pay | Admitting: Vascular Surgery

## 2015-07-07 LAB — URINE CULTURE: Culture: 3000

## 2015-07-07 NOTE — Telephone Encounter (Signed)
-----   Message from Mena Goes, RN sent at 07/04/2015 10:35 AM EST ----- Regarding: schedule   ----- Message -----    From: Ulyses Amor, PA-C    Sent: 07/04/2015   8:43 AM      To: Vvs Charge Pool  F/U in 6 weeks with Dr. Kellie Simmering s/p av fistula creation need fistula duplex at visit thx mc

## 2015-07-07 NOTE — Telephone Encounter (Signed)
Spoke with patient, dpm

## 2015-07-11 LAB — CULTURE, BLOOD (ROUTINE X 2)
Culture: NO GROWTH
Culture: NO GROWTH

## 2015-07-27 ENCOUNTER — Emergency Department (HOSPITAL_COMMUNITY)
Admission: EM | Admit: 2015-07-27 | Discharge: 2015-07-27 | Disposition: A | Discharge status: Home / Self Care | Payer: 59 | Attending: Emergency Medicine | Admitting: Emergency Medicine

## 2015-07-27 ENCOUNTER — Encounter (HOSPITAL_COMMUNITY): Payer: Self-pay

## 2015-07-27 ENCOUNTER — Emergency Department (HOSPITAL_COMMUNITY): Payer: 59

## 2015-07-27 DIAGNOSIS — I120 Hypertensive chronic kidney disease with stage 5 chronic kidney disease or end stage renal disease: Secondary | ICD-10-CM | POA: Insufficient documentation

## 2015-07-27 DIAGNOSIS — R062 Wheezing: Secondary | ICD-10-CM | POA: Diagnosis not present

## 2015-07-27 DIAGNOSIS — G629 Polyneuropathy, unspecified: Secondary | ICD-10-CM | POA: Insufficient documentation

## 2015-07-27 DIAGNOSIS — Z79818 Long term (current) use of other agents affecting estrogen receptors and estrogen levels: Secondary | ICD-10-CM | POA: Insufficient documentation

## 2015-07-27 DIAGNOSIS — R600 Localized edema: Secondary | ICD-10-CM | POA: Diagnosis not present

## 2015-07-27 DIAGNOSIS — Z79899 Other long term (current) drug therapy: Secondary | ICD-10-CM | POA: Diagnosis not present

## 2015-07-27 DIAGNOSIS — Z992 Dependence on renal dialysis: Secondary | ICD-10-CM | POA: Insufficient documentation

## 2015-07-27 DIAGNOSIS — E7800 Pure hypercholesterolemia, unspecified: Secondary | ICD-10-CM | POA: Diagnosis not present

## 2015-07-27 DIAGNOSIS — Z8719 Personal history of other diseases of the digestive system: Secondary | ICD-10-CM | POA: Insufficient documentation

## 2015-07-27 DIAGNOSIS — R0602 Shortness of breath: Secondary | ICD-10-CM | POA: Insufficient documentation

## 2015-07-27 DIAGNOSIS — R000 Tachycardia, unspecified: Secondary | ICD-10-CM | POA: Insufficient documentation

## 2015-07-27 DIAGNOSIS — N186 End stage renal disease: Secondary | ICD-10-CM | POA: Diagnosis not present

## 2015-07-27 DIAGNOSIS — Z794 Long term (current) use of insulin: Secondary | ICD-10-CM | POA: Insufficient documentation

## 2015-07-27 DIAGNOSIS — Z862 Personal history of diseases of the blood and blood-forming organs and certain disorders involving the immune mechanism: Secondary | ICD-10-CM | POA: Insufficient documentation

## 2015-07-27 DIAGNOSIS — E11319 Type 2 diabetes mellitus with unspecified diabetic retinopathy without macular edema: Secondary | ICD-10-CM | POA: Insufficient documentation

## 2015-07-27 LAB — BASIC METABOLIC PANEL
Anion gap: 11 (ref 5–15)
BUN: 26 mg/dL — AB (ref 6–20)
CHLORIDE: 106 mmol/L (ref 101–111)
CO2: 25 mmol/L (ref 22–32)
CREATININE: 2.1 mg/dL — AB (ref 0.44–1.00)
Calcium: 8.9 mg/dL (ref 8.9–10.3)
GFR calc Af Amer: 32 mL/min — ABNORMAL LOW (ref >60)
GFR calc non Af Amer: 28 mL/min — ABNORMAL LOW (ref >60)
Glucose, Bld: 174 mg/dL — ABNORMAL HIGH (ref 65–99)
POTASSIUM: 4.4 mmol/L (ref 3.5–5.1)
SODIUM: 142 mmol/L (ref 135–145)

## 2015-07-27 LAB — CBC
HEMATOCRIT: 30.9 % — AB (ref 36.0–46.0)
HEMOGLOBIN: 10 g/dL — AB (ref 12.0–15.0)
MCH: 28 pg (ref 26.0–34.0)
MCHC: 32.4 g/dL (ref 30.0–36.0)
MCV: 86.6 fL (ref 78.0–100.0)
Platelets: 224 10*3/uL (ref 150–400)
RBC: 3.57 MIL/uL — AB (ref 3.87–5.11)
RDW: 13 % (ref 11.5–15.5)
WBC: 6.2 10*3/uL (ref 4.0–10.5)

## 2015-07-27 LAB — BRAIN NATRIURETIC PEPTIDE: B NATRIURETIC PEPTIDE 5: 48.6 pg/mL (ref 0.0–100.0)

## 2015-07-27 MED ORDER — FUROSEMIDE 20 MG PO TABS
40.0000 mg | ORAL_TABLET | Freq: Once | ORAL | Status: AC
  Administered 2015-07-27: 40 mg via ORAL
  Filled 2015-07-27: qty 2

## 2015-07-27 MED ORDER — AZITHROMYCIN 250 MG PO TABS: ORAL_TABLET | ORAL | Status: DC

## 2015-07-27 NOTE — Discharge Instructions (Signed)
Please read and follow all provided instructions.  Your diagnoses today include:  1. Shortness of breath    Tests performed today include:  Vital signs. See below for your results today.   Medications prescribed:   None   Home care instructions:  Follow any educational materials contained in this packet.  Follow-up instructions: Please follow-up with your primary care provider in the next 48 hours for further evaluation of symptoms and treatment   Return instructions:   Please return to the Emergency Department if you do not get better, if you get worse, or new symptoms OR  - Fever (temperature greater than 101.38F)  - Bleeding that does not stop with holding pressure to the area    -Severe pain (please note that you may be more sore the day after your accident)  - Chest Pain  - Difficulty breathing  - Severe nausea or vomiting  - Inability to tolerate food and liquids  - Passing out  - Skin becoming red around your wounds  - Change in mental status (confusion or lethargy)  - New numbness or weakness     Please return if you have any other emergent concerns.  Additional Information:  Your vital signs today were: BP 135/70 mmHg   Pulse 116   Temp(Src) 98.6 F (37 C) (Oral)   Resp 20   Ht 5\' 3"  (1.6 m)   Wt 115.667 kg   BMI 45.18 kg/m2   SpO2 97% If your blood pressure (BP) was elevated above 135/85 this visit, please have this repeated by your doctor within one month. ---------------

## 2015-07-27 NOTE — ED Provider Notes (Signed)
CSN: [id number]     Arrival date & time 07/27/15  1505 History   First MD Initiated Contact with Patient 07/27/15 1605     Chief Complaint  Patient presents with  . Wheezing   (Consider location/radiation/quality/duration/timing/severity/associated sxs/prior Treatment) HPI 45 y.o. female with a hx of DM, HTN,HLD, CKD, presents to the Emergency Department today complaining of shortness of breath. Pt notes that she felt like she could not catch her breath last night and had trouble falling asleep lying flat. Required 2-3 pillows to fall asleep upright. Noticed that the shortness of breath was worse this morning and notified EMS. Given breathing treatment with albuterol with relief of symptoms. No Hx. COPD, Asthma, CHF. No CP/SOB/ABD pain. No N/V/D. No Numbness/tingling. No headache. Has Fistula due to impending dialysis treatments due to CKD. No other symptoms noted.   Past Medical History  Diagnosis Date  . Diabetes mellitus   . High triglycerides   . Hypertension   . Hypercholesteremia   . Anemia   . Chronic kidney disease   . Umbilical hernia   . Neuropathy ([place])   . Fatty liver   . Diabetic retinopathy [facility])    Past Surgical History  Procedure Laterality Date  . Cesarean section      X 2  . Pelvic laparoscopy    . Carpal tunnel release      X 2  . Oophorectomy      BSO  . Abdominal hysterectomy  FEB 2002    TAH/BSO for irregular bleeding and leiomyoma  . Eye surgery Bilateral     Retina reattachment  . Av fistula placement Left 07/04/2015    Procedure: ARTERIOVENOUS (AV) FISTULA CREATION-LEFT;  Surgeon: Mal Misty, MD;  Location: [facility] OR;  Service: Vascular;  Laterality: Left;   Family History  Problem Relation Age of Onset  . Diabetes Mother   . Hypertension Mother   . Diabetes Father   . Hypertension Father   . Cancer Father     STOMACH  . Stroke Maternal Grandmother   . Stroke Maternal Grandfather    Social History  Substance Use Topics  . Smoking status:  Never Smoker   . Smokeless tobacco: Never Used  . Alcohol Use: 0.0 oz/week    0 Standard drinks or equivalent per week     Comment: occasional   OB History    Gravida Para Term Preterm AB TAB SAB Ectopic Multiple Living   5 2 2  3     2      Review of Systems ROS reviewed and all are negative for acute change except as noted in the HPI.  Allergies  Review of patient's allergies indicates no known allergies.  Home Medications   Prior to Admission medications   Medication Sig Start Date End Date Taking? Authorizing Provider  Cholecalciferol (VITAMIN D3) 5000 UNITS CAPS Take by mouth.    Historical Provider, MD  estradiol (ESTRACE) 2 MG tablet Take 1 tablet (2 mg total) by mouth daily. 04/17/15   Anastasio Auerbach, MD  furosemide (LASIX) 40 MG tablet Take 40 mg by mouth daily as needed for fluid.  06/17/15   Historical Provider, MD  gabapentin (NEURONTIN) 300 MG capsule Take 300 mg by mouth at bedtime.     Historical Provider, MD  insulin aspart (NOVOLOG) 100 UNIT/ML injection Inject 30-40 units 3 times a day Patient taking differently: Inject 30-40 Units into the skin 3 (three) times daily with meals. Inject 30-40 units 3 times a day 05/14/13  Elayne Snare, MD  Insulin Degludec (TRESIBA FLEXTOUCH) 200 UNIT/ML SOPN Inject 120 Units into the skin at bedtime.     Historical Provider, MD  Insulin Pen Needle 32G X 4 MM MISC Inject 1 each into the skin 5 (five) times daily. 02/19/13   Elayne Snare, MD  Insulin Syringe-Needle U-100 (INSULIN SYRINGE .5CC/31GX5/16") 31G X 5/16" 0.5 ML MISC Inject 1 each into the skin 4 (four) times daily. 02/15/13   Elayne Snare, MD  modafinil (PROVIGIL) 200 MG tablet Take 1 tablet (200 mg total) by mouth daily. Patient not taking: Reported on 07/02/2015 03/18/15   Larey Seat, MD  Jonetta Speak LANCETS [id number] [place]  [birth year]   Historical Provider, MD  oxyCODONE-acetaminophen (PERCOCET/ROXICET) 5-325 MG tablet Take 1 tablet by mouth every 6 (six) hours as needed for  moderate pain. 07/04/15   Ulyses Amor, PA-C  PRESCRIPTION MEDICATION Take 1 tablet by mouth daily. Iron supplement    Historical Provider, MD  Pseudoephedrine-APAP-DM (TYLENOL COLD/FLU SEVERE DAY PO) Take 1 tablet by mouth daily as needed (for cold and flu symptoms).    Historical Provider, MD  rosuvastatin (CRESTOR) 20 MG tablet Take 20 mg by mouth daily.    Historical Provider, MD  temazepam (RESTORIL) 30 MG capsule Take 30 mg by mouth at bedtime as needed for sleep.  05/30/15   Historical Provider, MD  TRIBENZOR 40-10-25 MG TABS Take 1 tablet by mouth daily.  03/10/15   Historical Provider, MD  XIIDRA 5 % SOLN Place 1 drop into both eyes 2 (two) times daily. 06/26/15   Historical Provider, MD   BP 135/70 mmHg  Pulse 116  Temp(Src) 98.6 F (37 C) (Oral)  Resp 20  Ht 5\' 3"  (1.6 m)  Wt 115.667 kg  BMI 45.18 kg/m2  SpO2 97%   Physical Exam  Constitutional: She is oriented to person, place, and time. She appears well-developed and well-nourished.  HENT:  Head: Normocephalic and atraumatic.  Eyes: EOM are normal. Pupils are equal, round, and reactive to light.  Neck: Normal range of motion. Neck supple. No tracheal deviation present.  Cardiovascular: Normal rate, regular rhythm and normal heart sounds.   No murmur heard. Pulmonary/Chest: Effort normal and breath sounds normal. No respiratory distress. She has no wheezes. She has no rales. She exhibits no tenderness.  Abdominal: Soft. There is no tenderness.  Musculoskeletal: Normal range of motion.  2+ Pitting Edema BLE to Anterior Tib  Neurological: She is alert and oriented to person, place, and time.  Skin: Skin is warm and dry.  Psychiatric: She has a normal mood and affect. Her behavior is normal. Thought content normal.  Nursing note and vitals reviewed.  ED Course  Procedures (including critical care time) Labs Review Labs Reviewed - No data to display  Imaging Review Dg Chest 2 View  07/27/2015  CLINICAL DATA:  Shortness  of breath for 13 hours with ten-day history of nonproductive cough. EXAM: CHEST  2 VIEW COMPARISON:  07/06/2015 FINDINGS: Two views study shows interval development of diffuse interstitial and reticulonodular appearance, right greater than left. No dense focal airspace consolidation. There is no substantial pleural effusion. The cardiopericardial silhouette is within normal limits for size. The visualized bony structures of the thorax are intact. IMPRESSION: Right greater than left interstitial and micro nodular opacity. Imaging features suggest diffuse infection, right greater than left. Atypical infection would be a consideration. Electronically Signed   By: Misty Stanley M.D.   On: 07/27/2015 17:23   I have personally reviewed  and evaluated these images and lab results as part of my medical decision-making.   EKG Interpretation   Date/Time:  Sunday July 27 2015 15:29:25 EDT Ventricular Rate:  113 PR Interval:  149 QRS Duration: 74 QT Interval:  343 QTC Calculation: 470 R Axis:   -11 Text Interpretation:  Sinus tachycardia Atrial premature complex Low  voltage, extremity and precordial leads Consider anterior infarct  Nonspecific T abnormalities, lateral leads SINCE LAST TRACING HEART RATE  HAS INCREASED Confirmed by Winfred Leeds  MD, SAM [phone]) on 07/27/2015  4:07:14 PM      MDM  I have reviewed and evaluated the relevant laboratory values. I have reviewed and evaluated the relevant imaging studies. I have reviewed the relevant previous healthcare records. I obtained HPI from historian. Patient discussed with supervising physician  ED Course:  Assessment: Pt is a 55yF with hx DM, HTN,HLD, CKD who presents with SOB since last night. No hx CHF, COPD, Asthma. On exam, pt in NAD. Nontoxic/nonseptic appearing. VSS. Afebrile. Lungs CTA. Heart RRR. Abdomen nontender soft. Given breathing treatment PTA with relief of symptoms. Labs baseline. Imaging showed interstitial consolidation. Will Rx  ABX. Pt does have pitting edema BLE. Given lasix in ED due to missed dose. Plan is to DC home with follow up to PCP. Pt tachycardic on D/C. Appears baseline from previous encounters. Return instructions given if febrile/worsening status. At time of discharge, Patient is in no acute distress. Vital Signs are stable. Patient is able to ambulate. Patient able to tolerate PO.    Disposition/Plan:  DC Home Additional Verbal discharge instructions given and discussed with patient.  Pt Instructed to f/u with PCP in the next 48 hours for evaluation and treatment of symptoms. Return precautions given Pt acknowledges and agrees with plan  Supervising Physician Davonna Belling, MD   Final diagnoses:  Shortness of breath      Shary Decamp, PA-C 07/27/15 [place], PA-C 07/27/15 1848  Davonna Belling, MD 07/27/15 2214

## 2015-07-27 NOTE — ED Notes (Addendum)
Pt. Coming from home via [facility] c/o wheezing that started yesterday. Pt. Denies hx of asthma, COPD, or recent respiratory infection. Pt. sts her SOB/wheezing worsened last night and was unable to lay flat to sleep. Pt. Given 5mg  albuterol tx en route with relief. Pt. ESRD with new fistula, but not taking dialysis tx yet. Pt. Denies any CP or Abd. Pain. Pt. tachypneic before neb. Tx, but vital signs WDL at this time. Pt. Aox4.

## 2015-07-27 NOTE — ED Notes (Signed)
Wallked patient on pulse ox HR up to 132 with sats 96 to 100

## 2015-07-28 ENCOUNTER — Telehealth: Payer: Self-pay | Admitting: Neurology

## 2015-07-28 DIAGNOSIS — R0683 Snoring: Secondary | ICD-10-CM

## 2015-07-28 NOTE — Telephone Encounter (Signed)
[employer] denied Split sleep study however HST is suggested.  Can I get an order for a HST?

## 2015-08-05 ENCOUNTER — Encounter (INDEPENDENT_AMBULATORY_CARE_PROVIDER_SITE_OTHER): Payer: 59 | Admitting: Neurology

## 2015-08-05 DIAGNOSIS — G4733 Obstructive sleep apnea (adult) (pediatric): Secondary | ICD-10-CM

## 2015-08-05 DIAGNOSIS — R0683 Snoring: Secondary | ICD-10-CM

## 2015-08-08 ENCOUNTER — Encounter: Payer: Self-pay | Admitting: Vascular Surgery

## 2015-08-12 ENCOUNTER — Telehealth: Payer: Self-pay | Admitting: Neurology

## 2015-08-12 DIAGNOSIS — G4733 Obstructive sleep apnea (adult) (pediatric): Secondary | ICD-10-CM

## 2015-08-12 NOTE — Telephone Encounter (Signed)
Spoke to pt. I advised her that her HST showed osa and hypoxia for a prolonged period of time. An in lab attended study is indicated to titrate to CPAP treatment. PAP therapy is indicated due to hypoxia, and if PAP should not alleviate the hypoxia, oxygen titration will be performed. I also advised significant weight loss (80 lbs) and positional therapy. I advised pt to avoid sedative-hypnotics which may worsen sleep apnea, alcohol, and tobacco. I advised pt to avoid driving or operating hazardous machinery when sleepy. Pt verbalized understanding. Pt is willing to proceed with in-lab cpap titration and she is requesting that this be done ASAP.

## 2015-08-12 NOTE — Telephone Encounter (Signed)
Patient called, states at last visit, Dr. Brett Fairy prescribed something for her to stay awake, states sleepiness is affecting her job, she is falling asleep at work and has been warned. Needs to know how to proceed from here, had sleep study last Wednesday. Please advise. Call [phone] or [phone].

## 2015-08-13 ENCOUNTER — Telehealth: Payer: Self-pay | Admitting: Neurology

## 2015-08-13 DIAGNOSIS — G4730 Sleep apnea, unspecified: Secondary | ICD-10-CM

## 2015-08-13 DIAGNOSIS — E662 Morbid (severe) obesity with alveolar hypoventilation: Secondary | ICD-10-CM

## 2015-08-13 DIAGNOSIS — G4710 Hypersomnia, unspecified: Secondary | ICD-10-CM

## 2015-08-13 NOTE — Telephone Encounter (Signed)
[employer] denied CPAP Titration and suggested Auto Pap

## 2015-08-14 ENCOUNTER — Telehealth: Payer: Self-pay | Admitting: Neurology

## 2015-08-14 NOTE — Telephone Encounter (Signed)
I spoke to pt and advised her that I spoke with our sleep lab manager who has resubmitted the documentation to [place] trying to overturn the cpap titration denial. I advised her that if this does not now, we may have to resort to an auto pap. We will let pt know of a decision as soon as possible. Pt verbalized understanding.

## 2015-08-14 NOTE — Telephone Encounter (Signed)
Pt called in lab sleep study will not be covered under Insurance per [place]. She is wanting to know what the next step is.

## 2015-08-14 NOTE — Telephone Encounter (Signed)
Robin submitted request for authorization and submitted the HST, authorization is pending at this time.  Will request peer to peer if denied.

## 2015-08-14 NOTE — Telephone Encounter (Signed)
Autperhaps they allow a PAP nap? o PAP for this patient is ridiculous. That's not good medical care. I will be happy to do a peer to peer,

## 2015-08-19 ENCOUNTER — Ambulatory Visit (HOSPITAL_COMMUNITY)
Admission: RE | Admit: 2015-08-19 | Discharge: 2015-08-19 | Disposition: A | Discharge status: Home / Self Care | Payer: 59 | Source: Ambulatory Visit | Attending: Vascular Surgery | Admitting: Vascular Surgery

## 2015-08-19 ENCOUNTER — Encounter: Payer: Self-pay | Admitting: Vascular Surgery

## 2015-08-19 ENCOUNTER — Ambulatory Visit (INDEPENDENT_AMBULATORY_CARE_PROVIDER_SITE_OTHER): Payer: Self-pay | Admitting: Vascular Surgery

## 2015-08-19 VITALS — BP 120/64 | HR 100 | Temp 99.2°F | Resp 22 | Ht 63.0 in | Wt 247.0 lb

## 2015-08-19 DIAGNOSIS — E11319 Type 2 diabetes mellitus with unspecified diabetic retinopathy without macular edema: Secondary | ICD-10-CM | POA: Diagnosis not present

## 2015-08-19 DIAGNOSIS — E1122 Type 2 diabetes mellitus with diabetic chronic kidney disease: Secondary | ICD-10-CM | POA: Diagnosis not present

## 2015-08-19 DIAGNOSIS — E1140 Type 2 diabetes mellitus with diabetic neuropathy, unspecified: Secondary | ICD-10-CM | POA: Insufficient documentation

## 2015-08-19 DIAGNOSIS — Z4931 Encounter for adequacy testing for hemodialysis: Secondary | ICD-10-CM | POA: Insufficient documentation

## 2015-08-19 DIAGNOSIS — N186 End stage renal disease: Secondary | ICD-10-CM | POA: Insufficient documentation

## 2015-08-19 DIAGNOSIS — I120 Hypertensive chronic kidney disease with stage 5 chronic kidney disease or end stage renal disease: Secondary | ICD-10-CM | POA: Diagnosis not present

## 2015-08-19 DIAGNOSIS — N184 Chronic kidney disease, stage 4 (severe): Secondary | ICD-10-CM

## 2015-08-19 DIAGNOSIS — K760 Fatty (change of) liver, not elsewhere classified: Secondary | ICD-10-CM | POA: Insufficient documentation

## 2015-08-19 DIAGNOSIS — E7800 Pure hypercholesterolemia, unspecified: Secondary | ICD-10-CM | POA: Insufficient documentation

## 2015-08-19 NOTE — Progress Notes (Signed)
Subjective:    Patient ID: Cassandra Campos, female DOB: [birth year], 45 y.o.   MRN: [id number]   HPI:  This 45 year old female with chronic kidney disease stage IV has left brachial cephalic AV fistula which I created 6 weeks ago. Patient returns today for initial follow-up. She denies any pain or numbness in the left hand. She is not yet on hemodialysis.   Past Medical History  Diagnosis Date  . Diabetes mellitus   . High triglycerides   . Hypertension   . Hypercholesteremia   . Anemia   . Chronic kidney disease   . Umbilical hernia   . Neuropathy ([place])   . Fatty liver   . Diabetic retinopathy [facility])     Social History  Substance Use Topics  . Smoking status: Never Smoker   . Smokeless tobacco: Never Used  . Alcohol Use: 0.0 oz/week    0 Standard drinks or equivalent per week     Comment: occasional    Family History  Problem Relation Age of Onset  . Diabetes Mother   . Hypertension Mother   . Diabetes Father   . Hypertension Father   . Cancer Father     STOMACH  . Stroke Maternal Grandmother   . Stroke Maternal Grandfather     No Known Allergies   Current outpatient prescriptions:  .  metolazone (ZAROXOLYN) 2.5 MG tablet, Take 1 tablet by mouth. 3 days per week, Disp: , Rfl: 5 .  amLODipine (NORVASC) 10 MG tablet, Take 10 mg by mouth daily., Disp: , Rfl: 5 .  azithromycin (ZITHROMAX Z-PAK) 250 MG tablet, 500mg  PO once daily on day 1. THEN 250mg  PO once daily for 4 days, Disp: 6 tablet, Rfl: 0 .  Cholecalciferol (VITAMIN D3) 5000 UNITS CAPS, Take 5,000 Units by mouth daily. , Disp: , Rfl:  .  estradiol (ESTRACE) 2 MG tablet, Take 1 tablet (2 mg total) by mouth daily., Disp: 90 tablet, Rfl: 1 .  furosemide (LASIX) 40 MG tablet, Take 160 mg by mouth 2 (two) times daily. , Disp: , Rfl: 5 .  gabapentin (NEURONTIN) 300 MG capsule, Take 300 mg by mouth at bedtime. , Disp: , Rfl:  .  insulin aspart (NOVOLOG) 100 UNIT/ML injection, Inject 30-40 units 3 times a day (Patient  taking differently: Inject 30-40 Units into the skin 3 (three) times daily with meals. Inject 30-40 units 3 times a day), Disp: 4 vial, Rfl: 3 .  Insulin Degludec (TRESIBA FLEXTOUCH) 200 UNIT/ML SOPN, Inject 120 Units into the skin at bedtime. , Disp: , Rfl:  .  Insulin Pen Needle 32G X 4 MM MISC, Inject 1 each into the skin 5 (five) times daily., Disp: 150 each, Rfl: 5 .  Insulin Syringe-Needle U-100 (INSULIN SYRINGE .5CC/31GX5/16") 31G X 5/16" 0.5 ML MISC, Inject 1 each into the skin 4 (four) times daily., Disp: 150 each, Rfl: 5 .  Iron-FA-B Cmp-C-Biot-Probiotic (FUSION PLUS) CAPS, Take 1 capsule by mouth daily., Disp: , Rfl:  .  modafinil (PROVIGIL) 200 MG tablet, Take 1 tablet (200 mg total) by mouth daily. (Patient not taking: Reported on 07/02/2015), Disp: 30 tablet, Rfl: 2 .  ONETOUCH DELICA LANCETS 99991111 MISC, , Disp: , Rfl:  .  oxyCODONE-acetaminophen (PERCOCET/ROXICET) 5-325 MG tablet, Take 1 tablet by mouth every 6 (six) hours as needed for moderate pain. (Patient not taking: Reported on 07/27/2015), Disp: 20 tablet, Rfl: 0 .  rosuvastatin (CRESTOR) 20 MG tablet, Take 20 mg by mouth at bedtime. , Disp: , Rfl:  .  temazepam (RESTORIL) 30 MG capsule, Take 30 mg by mouth at bedtime. , Disp: , Rfl: 1 .  XIIDRA 5 % SOLN, Place 1 drop into both eyes 2 (two) times daily., Disp: , Rfl: 6  Filed Vitals:   08/19/15 1557  BP: 120/64  Pulse: 100  Temp: 99.2 F (37.3 C)  TempSrc: Oral  Resp: 22  Height: 5\' 3"  (1.6 m)  Weight: 247 lb (112.038 kg)  SpO2: 96%    Body mass index is 43.76 kg/(m^2).           ROS:   Denies chest pain, dyspnea on exertion, PND, orthopnea, hemoptysis  Objective:  Physical Exam: BP 120/64 mmHg  Pulse 100  Temp(Src) 99.2 F (37.3 C) (Oral)  Resp 22  Ht 5\' 3"  (1.6 m)  Wt 247 lb (112.038 kg)  BMI 43.76 kg/m2  SpO2 96%    Gen. Alert and oriented 3 no apparent distress Lungs no rhonch  Cardiovascular -regular rhythm no murmurs  left upper  extremity with excellent pulse and palpable thrill in brachial-cephalic AV fistula. Left hand well perfused.   fistula is somewhat deep in the upper arm.   today I ordered a duplex scan of the fistula which I reviewed and interpreted. There are some webs within the vein throughout the fistula but there is good caliber vein. There are 2 significant side branches and the fistula itself is somewhat deep-1.8 cm.    Assessment:      left brachial-cephalic AV fistula in patient with chronic kidney disease stage IV  needs elevation and ligation of side branches    Plan:      plan elevation of left brachial-cephalic AV fistula and ligation of side branches on Thursday April 27

## 2015-08-20 ENCOUNTER — Other Ambulatory Visit: Payer: Self-pay

## 2015-08-27 ENCOUNTER — Other Ambulatory Visit: Payer: Self-pay | Admitting: Gynecology

## 2015-08-27 ENCOUNTER — Telehealth: Payer: Self-pay

## 2015-08-27 ENCOUNTER — Encounter (HOSPITAL_COMMUNITY): Payer: Self-pay | Admitting: *Deleted

## 2015-08-27 MED ORDER — DEXTROSE 5 % IV SOLN
1.5000 g | INTRAVENOUS | Status: AC
  Administered 2015-08-28: 1.5 g via INTRAVENOUS
  Filled 2015-08-27: qty 1.5

## 2015-08-27 MED ORDER — SODIUM CHLORIDE 0.9 % IV SOLN
INTRAVENOUS | Status: DC
  Administered 2015-08-28: 07:00:00 via INTRAVENOUS

## 2015-08-27 NOTE — Telephone Encounter (Signed)
Butch Penny had contacted patient at my request to schedule CE because I just refilled her 90 d supply of estradiol and she is due at end of mos.  She told Butch Penny she is having breast pain still and asked to talk with a nurse. I called her. She said it is the same breast pain she saw Dr. Loetta Rough for before. She tried stopping the estradiol and she thinks it may have decreased a little while off but she had terrible hotflashes.  I asked her did she want to come back and let him recheck her breast and make recommendation and she did. Appt made.

## 2015-08-27 NOTE — Addendum Note (Signed)
Addended by: Larey Seat on: 08/27/2015 11:10 AM   Modules accepted: Orders

## 2015-08-27 NOTE — Progress Notes (Signed)
Pt denies cardiac history, chest pain or sob. Pt is diabetic. States last A1C was 9.3 in January or February, 2017. Pt states her fasting blood sugar runs between 109-121. Pt was instructed to take 1/2 of her regular dose of Tresiba this evening and none of her Novolog insulin in the AM by Dr. Evelena Leyden office. I instructed her to check her blood sugar in the AM and if blood sugar is 70 or less, she needs to treat it with a 1/2 cup (4 oz) of clear juice (apple or cranberry) and to recheck her blood sugar 15 minutes after drinking juice. These instructions given per Diabetes Medication Adjustment Guidelines Prior to Procedure and Surgery.

## 2015-08-27 NOTE — Telephone Encounter (Signed)
I spoke to pt and advised her that Dr. Brett Fairy has ordered an auto cpap for her. I will send the DME the order STAT in the hopes that they can start the cpap before her insurance expires. I will send a message to [employer] asking them for a STAT start. Pt is agreeable. A follow up appt was made for 10/27/15. Pt verbalized understanding.

## 2015-08-27 NOTE — Anesthesia Preprocedure Evaluation (Addendum)
Anesthesia Evaluation  Patient identified by MRN, date of birth, ID band Patient awake    Reviewed: Allergy & Precautions, NPO status , Patient's Chart, lab work & pertinent test results  Airway Mallampati: III  TM Distance: >3 FB Neck ROM: Full    Dental  (+) Teeth Intact, Dental Advisory Given   Pulmonary neg pulmonary ROS, sleep apnea and Continuous Positive Airway Pressure Ventilation ,    Pulmonary exam normal breath sounds clear to auscultation       Cardiovascular Exercise Tolerance: Poor hypertension, Pt. on medications (-) angina(-) CAD and (-) Past MI Normal cardiovascular exam Rhythm:Regular Rate:Normal     Neuro/Psych Neuropathy  negative psych ROS   GI/Hepatic negative GI ROS, Neg liver ROS,   Endo/Other  diabetes, Type 2, Insulin DependentMorbid obesity  Renal/GU Renal InsufficiencyRenal diseaseRI STAGE IV     Musculoskeletal negative musculoskeletal ROS (+)   Abdominal   Peds  Hematology  (+) Blood dyscrasia, anemia ,   Anesthesia Other Findings   Reproductive/Obstetrics negative OB ROS                           Anesthesia Physical Anesthesia Plan  ASA: III  Anesthesia Plan: General   Post-op Pain Management:    Induction: Intravenous  Airway Management Planned: LMA  Additional Equipment:   Intra-op Plan:   Post-operative Plan:   Informed Consent: I have reviewed the patients History and Physical, chart, labs and discussed the procedure including the risks, benefits and alternatives for the proposed anesthesia with the patient or authorized representative who has indicated his/her understanding and acceptance.     Plan Discussed with:   Anesthesia Plan Comments: (Check AM labs, MAC before, surgeon requesting GA)       Anesthesia Quick Evaluation

## 2015-08-27 NOTE — Telephone Encounter (Signed)
[employer] has denied cpap titration. [name] is working on an appeal, but we have not heard back yet if the denial will be overturned. Pt lost her job, and now she needs to start auto pap because her insurance will run out on 08/27/15.  Should we go ahead and just start pt on auto cpap? I am worried that the cpap titration appeal will not come back until after 08/27/15 and pt will have no insurance at that time.

## 2015-08-27 NOTE — Telephone Encounter (Signed)
I spoke with Cassandra Campos,  unfortunately [employer] requires prior Authorization and requires it to be billed as a rental.  She advised me that she is going to apply for Medicaid and she will be in touch with me as soon as she gets an approval or denial for coverage.  If they Deny her then we can try to get a machine donated from [place].  I will keep you updated as things go along.

## 2015-08-27 NOTE — Addendum Note (Signed)
Addended by: Lester [place] A on: 08/27/2015 11:39 AM   Modules accepted: Orders

## 2015-08-27 NOTE — Telephone Encounter (Addendum)
Patient called to advise she lost job yesterday and [employer] has denied CPAP lab, wants to know what the next option is or if can get this approved, will need to do by Friday 08/29/15 when insurance would cover 100%. Please call [phone].

## 2015-08-27 NOTE — Telephone Encounter (Signed)
The previous message is from [place].

## 2015-08-28 ENCOUNTER — Encounter (HOSPITAL_COMMUNITY): Payer: Self-pay | Admitting: Anesthesiology

## 2015-08-28 ENCOUNTER — Ambulatory Visit: Payer: 59 | Admitting: Gynecology

## 2015-08-28 ENCOUNTER — Encounter (HOSPITAL_COMMUNITY)
Admission: RE | Disposition: A | Discharge status: Home / Self Care | Payer: Self-pay | Source: Ambulatory Visit | Attending: Vascular Surgery

## 2015-08-28 ENCOUNTER — Ambulatory Visit (HOSPITAL_COMMUNITY): Payer: 59 | Admitting: Anesthesiology

## 2015-08-28 ENCOUNTER — Telehealth: Payer: Self-pay | Admitting: Vascular Surgery

## 2015-08-28 ENCOUNTER — Ambulatory Visit (HOSPITAL_COMMUNITY)
Admission: RE | Admit: 2015-08-28 | Discharge: 2015-08-28 | Disposition: A | Discharge status: Home / Self Care | Payer: 59 | Source: Ambulatory Visit | Attending: Vascular Surgery | Admitting: Vascular Surgery

## 2015-08-28 DIAGNOSIS — Z79890 Hormone replacement therapy (postmenopausal): Secondary | ICD-10-CM | POA: Insufficient documentation

## 2015-08-28 DIAGNOSIS — E1140 Type 2 diabetes mellitus with diabetic neuropathy, unspecified: Secondary | ICD-10-CM | POA: Insufficient documentation

## 2015-08-28 DIAGNOSIS — I129 Hypertensive chronic kidney disease with stage 1 through stage 4 chronic kidney disease, or unspecified chronic kidney disease: Secondary | ICD-10-CM | POA: Insufficient documentation

## 2015-08-28 DIAGNOSIS — E11319 Type 2 diabetes mellitus with unspecified diabetic retinopathy without macular edema: Secondary | ICD-10-CM | POA: Diagnosis not present

## 2015-08-28 DIAGNOSIS — E1122 Type 2 diabetes mellitus with diabetic chronic kidney disease: Secondary | ICD-10-CM | POA: Diagnosis not present

## 2015-08-28 DIAGNOSIS — Z79899 Other long term (current) drug therapy: Secondary | ICD-10-CM | POA: Diagnosis not present

## 2015-08-28 DIAGNOSIS — D649 Anemia, unspecified: Secondary | ICD-10-CM | POA: Diagnosis not present

## 2015-08-28 DIAGNOSIS — Z794 Long term (current) use of insulin: Secondary | ICD-10-CM | POA: Insufficient documentation

## 2015-08-28 DIAGNOSIS — Z6841 Body Mass Index (BMI) 40.0 and over, adult: Secondary | ICD-10-CM | POA: Insufficient documentation

## 2015-08-28 DIAGNOSIS — E782 Mixed hyperlipidemia: Secondary | ICD-10-CM | POA: Diagnosis not present

## 2015-08-28 DIAGNOSIS — N184 Chronic kidney disease, stage 4 (severe): Secondary | ICD-10-CM | POA: Diagnosis not present

## 2015-08-28 HISTORY — DX: Sleep apnea, unspecified: G47.30

## 2015-08-28 HISTORY — DX: Pneumonia, unspecified organism: J18.9

## 2015-08-28 HISTORY — PX: FISTULA SUPERFICIALIZATION: SHX6341

## 2015-08-28 LAB — GLUCOSE, CAPILLARY
GLUCOSE-CAPILLARY: 153 mg/dL — AB (ref 65–99)
GLUCOSE-CAPILLARY: 157 mg/dL — AB (ref 65–99)

## 2015-08-28 LAB — POCT I-STAT 4, (NA,K, GLUC, HGB,HCT)
Glucose, Bld: 161 mg/dL — ABNORMAL HIGH (ref 65–99)
HEMATOCRIT: 35 % — AB (ref 36.0–46.0)
HEMOGLOBIN: 11.9 g/dL — AB (ref 12.0–15.0)
Potassium: 3 mmol/L — ABNORMAL LOW (ref 3.5–5.1)
SODIUM: 139 mmol/L (ref 135–145)

## 2015-08-28 SURGERY — FISTULA SUPERFICIALIZATION
Anesthesia: General | Site: Arm Upper | Laterality: Left

## 2015-08-28 MED ORDER — LIDOCAINE-EPINEPHRINE (PF) 1 %-1:200000 IJ SOLN
INTRAMUSCULAR | Status: AC
  Filled 2015-08-28: qty 30

## 2015-08-28 MED ORDER — PROPOFOL 10 MG/ML IV BOLUS
INTRAVENOUS | Status: AC
  Filled 2015-08-28: qty 40

## 2015-08-28 MED ORDER — FENTANYL CITRATE (PF) 250 MCG/5ML IJ SOLN
INTRAMUSCULAR | Status: AC
  Filled 2015-08-28: qty 5

## 2015-08-28 MED ORDER — CHLORHEXIDINE GLUCONATE CLOTH 2 % EX PADS: 6.0000 | MEDICATED_PAD | Freq: Once | CUTANEOUS | Status: DC

## 2015-08-28 MED ORDER — ONDANSETRON HCL 4 MG/2ML IJ SOLN
INTRAMUSCULAR | Status: DC | PRN
  Administered 2015-08-28: 4 mg via INTRAVENOUS

## 2015-08-28 MED ORDER — PROPOFOL 500 MG/50ML IV EMUL
INTRAVENOUS | Status: DC | PRN
  Administered 2015-08-28: 25 ug/kg/min via INTRAVENOUS

## 2015-08-28 MED ORDER — LIDOCAINE HCL (CARDIAC) 20 MG/ML IV SOLN
INTRAVENOUS | Status: AC
  Filled 2015-08-28: qty 5

## 2015-08-28 MED ORDER — PROPOFOL 10 MG/ML IV BOLUS
INTRAVENOUS | Status: DC | PRN
  Administered 2015-08-28: 30 mg via INTRAVENOUS
  Administered 2015-08-28: 100 mg via INTRAVENOUS
  Administered 2015-08-28: 70 mg via INTRAVENOUS
  Administered 2015-08-28: 170 mg via INTRAVENOUS
  Administered 2015-08-28: 30 mg via INTRAVENOUS

## 2015-08-28 MED ORDER — ROCURONIUM BROMIDE 50 MG/5ML IV SOLN
INTRAVENOUS | Status: AC
  Filled 2015-08-28: qty 1

## 2015-08-28 MED ORDER — EPHEDRINE SULFATE 50 MG/ML IJ SOLN
INTRAMUSCULAR | Status: AC
  Filled 2015-08-28: qty 1

## 2015-08-28 MED ORDER — FENTANYL CITRATE (PF) 100 MCG/2ML IJ SOLN
INTRAMUSCULAR | Status: AC
  Filled 2015-08-28: qty 2

## 2015-08-28 MED ORDER — ARTIFICIAL TEARS OP OINT
TOPICAL_OINTMENT | OPHTHALMIC | Status: AC
Start: 2015-08-28 — End: 2015-08-28
  Filled 2015-08-28: qty 3.5

## 2015-08-28 MED ORDER — FENTANYL CITRATE (PF) 100 MCG/2ML IJ SOLN
25.0000 ug | INTRAMUSCULAR | Status: DC | PRN
  Administered 2015-08-28 (×2): 50 ug via INTRAVENOUS

## 2015-08-28 MED ORDER — ONDANSETRON HCL 4 MG/2ML IJ SOLN
INTRAMUSCULAR | Status: AC
  Filled 2015-08-28: qty 2

## 2015-08-28 MED ORDER — LIDOCAINE-EPINEPHRINE (PF) 1 %-1:200000 IJ SOLN: INTRAMUSCULAR | Status: DC | PRN

## 2015-08-28 MED ORDER — MIDAZOLAM HCL 2 MG/2ML IJ SOLN
INTRAMUSCULAR | Status: AC
  Filled 2015-08-28: qty 2

## 2015-08-28 MED ORDER — ARTIFICIAL TEARS OP OINT
TOPICAL_OINTMENT | OPHTHALMIC | Status: AC
  Filled 2015-08-28: qty 3.5

## 2015-08-28 MED ORDER — ONDANSETRON HCL 4 MG/2ML IJ SOLN: 4.0000 mg | Freq: Once | INTRAMUSCULAR | Status: DC

## 2015-08-28 MED ORDER — MIDAZOLAM HCL 5 MG/5ML IJ SOLN
INTRAMUSCULAR | Status: DC | PRN
  Administered 2015-08-28 (×2): 1 mg via INTRAVENOUS

## 2015-08-28 MED ORDER — FENTANYL CITRATE (PF) 100 MCG/2ML IJ SOLN
INTRAMUSCULAR | Status: DC | PRN
  Administered 2015-08-28 (×2): 50 ug via INTRAVENOUS

## 2015-08-28 MED ORDER — OXYCODONE-ACETAMINOPHEN 5-325 MG PO TABS
ORAL_TABLET | ORAL | Status: AC
  Administered 2015-08-28: 1
  Filled 2015-08-28: qty 1

## 2015-08-28 MED ORDER — SUCCINYLCHOLINE CHLORIDE 20 MG/ML IJ SOLN
INTRAMUSCULAR | Status: AC
  Filled 2015-08-28: qty 1

## 2015-08-28 MED ORDER — 0.9 % SODIUM CHLORIDE (POUR BTL) OPTIME
TOPICAL | Status: DC | PRN
  Administered 2015-08-28: 1000 mL

## 2015-08-28 MED ORDER — SODIUM CHLORIDE 0.9 % IJ SOLN
INTRAMUSCULAR | Status: AC
  Filled 2015-08-28: qty 10

## 2015-08-28 MED ORDER — OXYCODONE-ACETAMINOPHEN 5-325 MG PO TABS: 1.0000 | ORAL_TABLET | Freq: Four times a day (QID) | ORAL | Status: DC | PRN

## 2015-08-28 MED ORDER — MEPERIDINE HCL 25 MG/ML IJ SOLN: 6.2500 mg | INTRAMUSCULAR | Status: DC | PRN

## 2015-08-28 MED ORDER — SODIUM CHLORIDE 0.9 % IV SOLN
INTRAVENOUS | Status: DC | PRN
  Administered 2015-08-28: 07:00:00

## 2015-08-28 SURGICAL SUPPLY — 31 items
CANISTER SUCTION 2500CC (MISCELLANEOUS) ×2 IMPLANT
CLIP TI MEDIUM 6 (CLIP) ×2 IMPLANT
CLIP TI WIDE RED SMALL 6 (CLIP) ×4 IMPLANT
ELECT REM PT RETURN 9FT ADLT (ELECTROSURGICAL) ×2
ELECTRODE REM PT RTRN 9FT ADLT (ELECTROSURGICAL) ×1 IMPLANT
GAUZE SPONGE 4X4 12PLY STRL (GAUZE/BANDAGES/DRESSINGS) ×2 IMPLANT
GLOVE BIO SURGEON STRL SZ 6.5 (GLOVE) ×2 IMPLANT
GLOVE BIOGEL PI IND STRL 6.5 (GLOVE) ×2 IMPLANT
GLOVE BIOGEL PI IND STRL 7.0 (GLOVE) ×2 IMPLANT
GLOVE BIOGEL PI IND STRL 8 (GLOVE) ×1 IMPLANT
GLOVE BIOGEL PI INDICATOR 6.5 (GLOVE) ×2
GLOVE BIOGEL PI INDICATOR 7.0 (GLOVE) ×2
GLOVE BIOGEL PI INDICATOR 8 (GLOVE) ×1
GLOVE SS BIOGEL STRL SZ 7 (GLOVE) ×1 IMPLANT
GLOVE SUPERSENSE BIOGEL SZ 7 (GLOVE) ×1
GLOVE SURG SS PI 6.5 STRL IVOR (GLOVE) ×2 IMPLANT
GLOVE SURG SS PI 7.5 STRL IVOR (GLOVE) ×2 IMPLANT
GOWN STRL REUS W/ TWL LRG LVL3 (GOWN DISPOSABLE) ×3 IMPLANT
GOWN STRL REUS W/TWL LRG LVL3 (GOWN DISPOSABLE) ×3
KIT BASIN OR (CUSTOM PROCEDURE TRAY) ×2 IMPLANT
KIT ROOM TURNOVER OR (KITS) ×2 IMPLANT
LIQUID BAND (GAUZE/BANDAGES/DRESSINGS) ×2 IMPLANT
NS IRRIG 1000ML POUR BTL (IV SOLUTION) ×2 IMPLANT
PACK CV ACCESS (CUSTOM PROCEDURE TRAY) ×2 IMPLANT
PAD ARMBOARD 7.5X6 YLW CONV (MISCELLANEOUS) ×4 IMPLANT
SUT PROLENE 6 0 BV (SUTURE) ×2 IMPLANT
SUT VIC AB 3-0 SH 27 (SUTURE) ×2
SUT VIC AB 3-0 SH 27X BRD (SUTURE) ×2 IMPLANT
SUT VIC AB 3-0 SH 8-18 (SUTURE) ×2 IMPLANT
UNDERPAD 30X30 INCONTINENT (UNDERPADS AND DIAPERS) ×2 IMPLANT
WATER STERILE IRR 1000ML POUR (IV SOLUTION) ×2 IMPLANT

## 2015-08-28 NOTE — Anesthesia Procedure Notes (Signed)
Procedure Name: LMA Insertion Date/Time: 08/28/2015 7:40 AM Performed by: Scheryl Darter Pre-anesthesia Checklist: Patient identified, Emergency Drugs available, Suction available, Patient being monitored and Timeout performed Patient Re-evaluated:Patient Re-evaluated prior to inductionOxygen Delivery Method: Circle system utilized Preoxygenation: Pre-oxygenation with 100% oxygen Intubation Type: IV induction Ventilation: Mask ventilation without difficulty LMA: LMA inserted LMA Size: 4.0 Number of attempts: 1 Placement Confirmation: positive ETCO2 and breath sounds checked- equal and bilateral Tube secured with: Tape

## 2015-08-28 NOTE — Discharge Instructions (Signed)
° ° °  08/28/2015 Cassandra Campos [id number] [birth year]  Surgeon(s): Mal Misty, MD  Procedure(s): BRACHIOCEPHALIC FISTULA SUPERFICIALIZATION  x Do not stick graft for 6 weeks

## 2015-08-28 NOTE — Telephone Encounter (Signed)
Spoke to pt to sch appt for 09/23/15 at 4:00.

## 2015-08-28 NOTE — H&P (View-Only) (Signed)
Subjective:    Patient ID: Cassandra Campos, female DOB: 12/28/1970, 45 y.o.   MRN: [id number]   HPI:  This 45 year old female with chronic kidney disease stage IV has left brachial cephalic AV fistula which I created 6 weeks ago. Patient returns today for initial follow-up. She denies any pain or numbness in the left hand. She is not yet on hemodialysis.   Past Medical History  Diagnosis Date  . Diabetes mellitus   . High triglycerides   . Hypertension   . Hypercholesteremia   . Anemia   . Chronic kidney disease   . Umbilical hernia   . Neuropathy ([place])   . Fatty liver   . Diabetic retinopathy [facility])     Social History  Substance Use Topics  . Smoking status: Never Smoker   . Smokeless tobacco: Never Used  . Alcohol Use: 0.0 oz/week    0 Standard drinks or equivalent per week     Comment: occasional    Family History  Problem Relation Age of Onset  . Diabetes Mother   . Hypertension Mother   . Diabetes Father   . Hypertension Father   . Cancer Father     STOMACH  . Stroke Maternal Grandmother   . Stroke Maternal Grandfather     No Known Allergies   Current outpatient prescriptions:  .  metolazone (ZAROXOLYN) 2.5 MG tablet, Take 1 tablet by mouth. 3 days per week, Disp: , Rfl: 5 .  amLODipine (NORVASC) 10 MG tablet, Take 10 mg by mouth daily., Disp: , Rfl: 5 .  azithromycin (ZITHROMAX Z-PAK) 250 MG tablet, 500mg  PO once daily on day 1. THEN 250mg  PO once daily for 4 days, Disp: 6 tablet, Rfl: 0 .  Cholecalciferol (VITAMIN D3) 5000 UNITS CAPS, Take 5,000 Units by mouth daily. , Disp: , Rfl:  .  estradiol (ESTRACE) 2 MG tablet, Take 1 tablet (2 mg total) by mouth daily., Disp: 90 tablet, Rfl: 1 .  furosemide (LASIX) 40 MG tablet, Take 160 mg by mouth 2 (two) times daily. , Disp: , Rfl: 5 .  gabapentin (NEURONTIN) 300 MG capsule, Take 300 mg by mouth at bedtime. , Disp: , Rfl:  .  insulin aspart (NOVOLOG) 100 UNIT/ML injection, Inject 30-40 units 3 times a day (Patient  taking differently: Inject 30-40 Units into the skin 3 (three) times daily with meals. Inject 30-40 units 3 times a day), Disp: 4 vial, Rfl: 3 .  Insulin Degludec (TRESIBA FLEXTOUCH) 200 UNIT/ML SOPN, Inject 120 Units into the skin at bedtime. , Disp: , Rfl:  .  Insulin Pen Needle 32G X 4 MM MISC, Inject 1 each into the skin 5 (five) times daily., Disp: 150 each, Rfl: 5 .  Insulin Syringe-Needle U-100 (INSULIN SYRINGE .5CC/31GX5/16") 31G X 5/16" 0.5 ML MISC, Inject 1 each into the skin 4 (four) times daily., Disp: 150 each, Rfl: 5 .  Iron-FA-B Cmp-C-Biot-Probiotic (FUSION PLUS) CAPS, Take 1 capsule by mouth daily., Disp: , Rfl:  .  modafinil (PROVIGIL) 200 MG tablet, Take 1 tablet (200 mg total) by mouth daily. (Patient not taking: Reported on 07/02/2015), Disp: 30 tablet, Rfl: 2 .  ONETOUCH DELICA LANCETS 99991111 MISC, , Disp: , Rfl:  .  oxyCODONE-acetaminophen (PERCOCET/ROXICET) 5-325 MG tablet, Take 1 tablet by mouth every 6 (six) hours as needed for moderate pain. (Patient not taking: Reported on 07/27/2015), Disp: 20 tablet, Rfl: 0 .  rosuvastatin (CRESTOR) 20 MG tablet, Take 20 mg by mouth at bedtime. , Disp: , Rfl:  .  temazepam (RESTORIL) 30 MG capsule, Take 30 mg by mouth at bedtime. , Disp: , Rfl: 1 .  XIIDRA 5 % SOLN, Place 1 drop into both eyes 2 (two) times daily., Disp: , Rfl: 6  Filed Vitals:   08/19/15 1557  BP: 120/64  Pulse: 100  Temp: 99.2 F (37.3 C)  TempSrc: Oral  Resp: 22  Height: 5\' 3"  (1.6 m)  Weight: 247 lb (112.038 kg)  SpO2: 96%    Body mass index is 43.76 kg/(m^2).           ROS:   Denies chest pain, dyspnea on exertion, PND, orthopnea, hemoptysis  Objective:  Physical Exam: BP 120/64 mmHg  Pulse 100  Temp(Src) 99.2 F (37.3 C) (Oral)  Resp 22  Ht 5\' 3"  (1.6 m)  Wt 247 lb (112.038 kg)  BMI 43.76 kg/m2  SpO2 96%    Gen. Alert and oriented 3 no apparent distress Lungs no rhonch  Cardiovascular -regular rhythm no murmurs  left upper  extremity with excellent pulse and palpable thrill in brachial-cephalic AV fistula. Left hand well perfused.   fistula is somewhat deep in the upper arm.   today I ordered a duplex scan of the fistula which I reviewed and interpreted. There are some webs within the vein throughout the fistula but there is good caliber vein. There are 2 significant side branches and the fistula itself is somewhat deep-1.8 cm.    Assessment:      left brachial-cephalic AV fistula in patient with chronic kidney disease stage IV  needs elevation and ligation of side branches    Plan:      plan elevation of left brachial-cephalic AV fistula and ligation of side branches on Thursday April 27

## 2015-08-28 NOTE — Anesthesia Postprocedure Evaluation (Signed)
Anesthesia Post Note  Patient: Cassandra Campos  Procedure(s) Performed: Procedure(s) (LRB): BRACHIOCEPHALIC FISTULA SUPERFICIALIZATION (Left)  Patient location during evaluation: PACU Anesthesia Type: General Level of consciousness: awake and alert Pain management: pain level controlled Vital Signs Assessment: post-procedure vital signs reviewed and stable Respiratory status: spontaneous breathing, nonlabored ventilation, respiratory function stable and patient connected to nasal cannula oxygen Cardiovascular status: blood pressure returned to baseline and stable Postop Assessment: no signs of nausea or vomiting Anesthetic complications: no    Last Vitals:  Filed Vitals:   08/28/15 0600 08/28/15 0841  BP: 164/97 136/82  Pulse:  95  Temp:  37.1 C  Resp:  18    Last Pain:  Filed Vitals:   08/28/15 0856  PainSc: 0-No pain                 Alexis Frock

## 2015-08-28 NOTE — Addendum Note (Signed)
Addendum  created 08/28/15 1301 by Scheryl Darter, CRNA   Modules edited: Anesthesia Events, Narrator   Narrator:  Narrator: Event Log Edited

## 2015-08-28 NOTE — Interval H&P Note (Signed)
History and Physical Interval Note:  08/28/2015 7:21 AM  Cassandra Campos  has presented today for surgery, with the diagnosis of Stage IV Chronic Kidney Disease N18.4  The various methods of treatment have been discussed with the patient and family. After consideration of risks, benefits and other options for treatment, the patient has consented to  Procedure(s): BRACHIOCEPHALIC FISTULA SUPERFICIALIZATION (Left) as a surgical intervention .  The patient's history has been reviewed, patient examined, no change in status, stable for surgery.  I have reviewed the patient's chart and labs.  Questions were answered to the patient's satisfaction.     Tinnie Gens

## 2015-08-28 NOTE — Telephone Encounter (Signed)
-----   Message from Mena Goes, RN sent at 08/28/2015  9:21 AM EDT ----- Regarding: schedule   ----- Message -----    From: Alvia Grove, PA-C    Sent: 08/28/2015   8:28 AM      To: [facility] Charge Pool  S/p superficialization left arm AVF 08/28/15  F/u with Dr. Kellie Simmering in 4 weeks. No duplex.  Thanks Maudie Mercury

## 2015-08-28 NOTE — Transfer of Care (Signed)
Immediate Anesthesia Transfer of Care Note  Patient: Cassandra Campos  Procedure(s) Performed: Procedure(s): BRACHIOCEPHALIC FISTULA SUPERFICIALIZATION (Left)  Patient Location: PACU  Anesthesia Type:General  Level of Consciousness: awake, alert , oriented and sedated  Airway & Oxygen Therapy: Patient Spontanous Breathing and Patient connected to face mask oxygen  Post-op Assessment: Report given to RN, Post -op Vital signs reviewed and stable and Patient moving all extremities  Post vital signs: Reviewed and stable  Last Vitals:  Filed Vitals:   08/28/15 0559 08/28/15 0600  BP: 181/93 164/97  Pulse: 100   Temp: 36.7 C   Resp: 20     Complications: No apparent anesthesia complications

## 2015-08-28 NOTE — Op Note (Signed)
OPERATIVE REPORT  Date of Surgery: 08/28/2015  Surgeon: Tinnie Gens, MD  Assistant: Silva Bandy PA  Pre-op Diagnosis: Stage IV Chronic Kidney Disease N18.4  Post-op Diagnosis: Stage IV Chronic Kidney Disease N18.4  Procedure: Procedure(s): BRACHIOCEPHALIC FISTULA SUPERFICIALIZATION-left upper extremity Anesthesia: LMA  EBL: Minimal  Complications: None  Procedure Details: The patient signed the operative placed in supine position of time satisfactory general-LMA anesthesia was administered. Left upper extremity was prepped Betadine scrub and solution draped in routine sterile manner. There was a functioning brachial-cephalic AV fistula which was quite deep in the upper arm. 2 long incisions were made with a sizable gap between these incisions. The cephalic vein-fistula was circumdentally mobilized from the antecubital area to the deltopectoral groove. 2 sizable branches ligated with 3-0 silk ties and divided. The dead space in the depth of the arm was closed interrupted 3-0 Vicryl sutures. This brought the vein up to just below the skin. Skin was closed in a   Tinnie Gens, subcuticular single subcuticular layer with 3-0 Vicryl sterile dressing applied there was excellent Doppler flow in the fistula conclusion case Dermabond was placed over the incisions. Patient taken to recovery in satisfactory condition MD 08/28/2015 8:33 AM

## 2015-09-01 ENCOUNTER — Telehealth: Payer: Self-pay | Admitting: *Deleted

## 2015-09-01 NOTE — Telephone Encounter (Signed)
Patient called to triage regarding clear straw colored fluid draining out of her incision. She noticed a couple of stains on her shirt over the weekend. Pt had superficialization of left brachiocephalic AVF on [id number] by Dr. Kellie Simmering. She is afebrile, no chills and no other S&S of infection. We discussed elevation of arm, wound care, and she will cover incision with a dry gauze for protection against her clothing. She will call us if she has any more drainage or S&S of infection. Patient voiced understanding and agreement with this plan.

## 2015-09-02 ENCOUNTER — Encounter (HOSPITAL_COMMUNITY): Payer: Self-pay | Admitting: Vascular Surgery

## 2015-09-05 NOTE — Telephone Encounter (Signed)
Dear [name], autotitration with oximetry to be ordered, as patient is not allowed to have attended titration?  This patient has renal disease stage 4 nd is at high risk of converting to central apnea. No way can/ should she get auto titration. Cassandra Campos /

## 2015-09-18 ENCOUNTER — Encounter: Payer: Self-pay | Admitting: Vascular Surgery

## 2015-09-23 ENCOUNTER — Ambulatory Visit (INDEPENDENT_AMBULATORY_CARE_PROVIDER_SITE_OTHER): Payer: Medicaid Other | Admitting: Vascular Surgery

## 2015-09-23 ENCOUNTER — Encounter: Payer: 59 | Admitting: Vascular Surgery

## 2015-09-23 ENCOUNTER — Encounter: Payer: Self-pay | Admitting: Vascular Surgery

## 2015-09-23 VITALS — BP 132/88 | HR 89 | Temp 98.5°F | Ht 63.0 in | Wt 243.0 lb

## 2015-09-23 DIAGNOSIS — N184 Chronic kidney disease, stage 4 (severe): Secondary | ICD-10-CM

## 2015-09-23 NOTE — Progress Notes (Signed)
Subjective:     Patient ID: Cassandra Campos, female   DOB: [birth year], 45 y.o.   MRN: [id number]  HPIthis 45 year old female with chronic kidney disease stage IV is not on hemodialysis. She is followed by Dr. Jamal Maes. She had left brachial cephalic AV fistula created by me 3-4 months ago and had elevation of the fistula performed 08/28/2015. She's had no pain or numbness in the left hand. She has no specific complaints.   Review of Systems     Objective:   Physical Exam BP 132/88 mmHg  Pulse 89  Temp(Src) 98.5 F (36.9 C) (Oral)  Ht 5\' 3"  (1.6 m)  Wt 243 lb (110.224 kg)  BMI 43.06 kg/m2  SpO2 99%  Gen. Well-developed well-nourished female no apparent distress alert and oriented 3 Left upper extremity with well-healed upper arm incisions with excellent pulse and palpable thrill and brachial-cephalic AV fistula. Left hand well perfused with 2+ radial pulse palpable.     Assessment:     Nicely functioning left brachial-cephalic AV fistula following elevation of fistula 4 weeks ago in patient with chronic kidney disease stage IV-not on hemodialysis     Plan:     If patient should require hemodialysis-fistula could be utilized at any time Return to see me on when necessary basis

## 2015-09-24 ENCOUNTER — Telehealth: Payer: Self-pay

## 2015-09-24 DIAGNOSIS — R0683 Snoring: Secondary | ICD-10-CM

## 2015-09-24 NOTE — Telephone Encounter (Signed)
Received this notice from [employer]:  "This patient is now covered by Medicaid and after requesting Authorization it was denied because they do not accept in home sleep Studies. She will need an in lab Sleep study then I can request Authorization to get her set up on a Cpap with Medicaid."  Ok to order a split night study?

## 2015-09-24 NOTE — Telephone Encounter (Signed)
Spoke to pt and advised her that [employer] advised [place] that pt now needs an in lab study because of her insurance change. Pt is agreeable to an in lab study. Pt was advised that our sleep lab will call her to schedule her sleep study. Pt verbalized understanding.

## 2015-09-24 NOTE — Telephone Encounter (Signed)
Please order in lab titration.

## 2015-09-24 NOTE — Addendum Note (Signed)
Addended by: Lester [place] A on: 09/24/2015 05:02 PM   Modules accepted: Orders

## 2015-10-20 ENCOUNTER — Ambulatory Visit (INDEPENDENT_AMBULATORY_CARE_PROVIDER_SITE_OTHER): Payer: Medicaid Other | Admitting: Neurology

## 2015-10-20 DIAGNOSIS — R0683 Snoring: Secondary | ICD-10-CM

## 2015-10-20 DIAGNOSIS — G4710 Hypersomnia, unspecified: Secondary | ICD-10-CM | POA: Diagnosis not present

## 2015-10-20 DIAGNOSIS — G4730 Sleep apnea, unspecified: Secondary | ICD-10-CM

## 2015-10-27 ENCOUNTER — Ambulatory Visit: Payer: Self-pay | Admitting: Neurology

## 2015-10-27 ENCOUNTER — Telehealth: Payer: Self-pay

## 2015-10-27 NOTE — Telephone Encounter (Signed)
I spoke to Cassandra Campos and advised her that her appt today with Dr. Brett Fairy needs to be cancelled because Dr. Brett Fairy left the office sick. Cassandra Campos is agreeable. Cassandra Campos knows that when I get her sleep study results, I will call her and make that appt. Cassandra Campos verbalized understanding that her appt is cancelled for today.

## 2015-10-30 ENCOUNTER — Telehealth: Payer: Self-pay

## 2015-10-30 DIAGNOSIS — G4733 Obstructive sleep apnea (adult) (pediatric): Secondary | ICD-10-CM

## 2015-10-30 NOTE — Telephone Encounter (Signed)
I spoke to pt regarding her sleep study results. I advised her that her study revealed mild osa but CPAP treatment is advised. PAP therapy is indicated due to hypoxemia duration. Dr. Brett Fairy recommends proceeding with a titration study to optimize therapy. I advised her that weight loss and positional therapy may be entertained. Pt verbalized understanding. I advised pt that there was some periodic limb movements of sleep resulting in significant sleep disruption and that Dr. Brett Fairy will consider treating the PLMS if they fail to resolve with PAP therapy of if there is a compelling history of RLS. I advised pt to avoid sedative-hypnotics which may worsen sleep apnea, alcohol and tobacco. I advised pt to lose weight, diet and exercise if not contraindicated by her other physicians. I advised pt to avoid caffeine containing beverages and chocolate. I advised pt that if she does not respond to cpap therapy, oxygen therapy may be of help. Pt verbalized understanding and is agreeable to a cpap titration study. I advised her that our sleep lab will call her to scheduled this cpap titration.

## 2015-11-27 ENCOUNTER — Ambulatory Visit (INDEPENDENT_AMBULATORY_CARE_PROVIDER_SITE_OTHER): Payer: Medicaid Other | Admitting: Neurology

## 2015-11-27 ENCOUNTER — Telehealth: Payer: Self-pay | Admitting: *Deleted

## 2015-11-27 DIAGNOSIS — G4733 Obstructive sleep apnea (adult) (pediatric): Secondary | ICD-10-CM

## 2015-11-27 MED ORDER — ESTRADIOL 2 MG PO TABS: ORAL_TABLET | ORAL | Status: DC

## 2015-11-27 NOTE — Telephone Encounter (Signed)
Pt called requesting refill on estrace 2 mg tablet, annual in Sept. Rx sent.

## 2015-12-09 ENCOUNTER — Telehealth: Payer: Self-pay

## 2015-12-09 DIAGNOSIS — G4733 Obstructive sleep apnea (adult) (pediatric): Secondary | ICD-10-CM

## 2015-12-09 NOTE — Telephone Encounter (Signed)
I spoke with pt regarding her sleep study results. I advised her that compared with a previous sleep study, there was a significant improvement with less respiratory events during titration and that Dr. Brett Fairy recommends starting a cpap. Pt says that she "had the best sleep of my life" the night of the cpap titration and is agreeable to starting a cpap. I advised pt that I would send the order to [facility] and they would contact her for cpap set up. I advised pt to avoid caffeine containing beverages and chocolate.  Pt verbalized understanding of results. Pt had no questions at this time but was encouraged to call back if questions arise.

## 2015-12-23 ENCOUNTER — Other Ambulatory Visit: Payer: Self-pay | Admitting: Internal Medicine

## 2015-12-23 DIAGNOSIS — Z1231 Encounter for screening mammogram for malignant neoplasm of breast: Secondary | ICD-10-CM

## 2016-01-08 ENCOUNTER — Ambulatory Visit
Admission: RE | Admit: 2016-01-08 | Discharge: 2016-01-08 | Disposition: A | Discharge status: Home / Self Care | Payer: Medicaid Other | Source: Ambulatory Visit | Attending: Internal Medicine | Admitting: Internal Medicine

## 2016-01-08 DIAGNOSIS — Z1231 Encounter for screening mammogram for malignant neoplasm of breast: Secondary | ICD-10-CM

## 2016-01-21 ENCOUNTER — Encounter: Payer: Self-pay | Admitting: Gynecology

## 2016-01-21 ENCOUNTER — Ambulatory Visit (INDEPENDENT_AMBULATORY_CARE_PROVIDER_SITE_OTHER): Payer: Medicaid Other | Admitting: Gynecology

## 2016-01-21 VITALS — BP 124/80 | Ht 63.0 in | Wt 239.0 lb

## 2016-01-21 DIAGNOSIS — N898 Other specified noninflammatory disorders of vagina: Secondary | ICD-10-CM

## 2016-01-21 DIAGNOSIS — K439 Ventral hernia without obstruction or gangrene: Secondary | ICD-10-CM | POA: Diagnosis not present

## 2016-01-21 DIAGNOSIS — Z79890 Hormone replacement therapy (postmenopausal): Secondary | ICD-10-CM

## 2016-01-21 DIAGNOSIS — Z01419 Encounter for gynecological examination (general) (routine) without abnormal findings: Secondary | ICD-10-CM

## 2016-01-21 LAB — WET PREP FOR TRICH, YEAST, CLUE
Trich, Wet Prep: NONE SEEN
WBC, Wet Prep HPF POC: NONE SEEN
Yeast Wet Prep HPF POC: NONE SEEN

## 2016-01-21 MED ORDER — ESTRADIOL 2 MG PO TABS: ORAL_TABLET | ORAL | 4 refills | Status: DC

## 2016-01-21 NOTE — Progress Notes (Signed)
    Cassandra Campos 1970/07/13 [id number]        45 y.o.  [id number]  for breast and pelvic exam. Several issues noted below.  Past medical history,surgical history, problem list, medications, allergies, family history and social history were all reviewed and documented as reviewed in the EPIC chart.  ROS:  Performed with pertinent positives and negatives included in the history, assessment and plan.   Additional significant findings :  None   Exam: Caryn Bee assistant Vitals:   01/21/16 1500  BP: 124/80  Weight: 239 lb (108.4 kg)  Height: 5\' 3"  (1.6 m)   Body mass index is 42.34 kg/m.  General appearance:  Normal affect, orientation and appearance. Skin: Grossly normal HEENT: Without gross lesions.  No cervical or supraclavicular adenopathy. Thyroid normal.  Lungs:  Clear without wheezing, rales or rhonchi Cardiac: RR, without RMG Abdominal:  Soft, nontender, without masses, guarding, rebound, organomegaly or hernia Breasts:  Examined lying and sitting without masses, retractions, discharge or axillary adenopathy. Pelvic:  Ext/BUS/Vagina normal excepting slightly heavier white discharge.  Adnexa without masses or tenderness    Anus and perineum normal   Rectovaginal normal sphincter tone without palpated masses or tenderness.    Assessment/Plan:  45 y.o. [id number] female for breast and pelvic exam.  1. Status post TAH for bleeding leiomyoma. Subsequent BSO for cystic changes on Estrace 2 mg daily. She tried stopping but had unacceptable hot flushes and sweats. I reviewed the new 2017 N[employer] HRT guidelines with her. Benefits of symptom relief, possible cardiovascular support when started early, and bone health versus risks to include thrombosis particularly in a patient who is obese and being followed for medical issues such as diabetes,hypercholesterolemia and hypertension as well as possible increased risk of breast cancer with hrt. Patient feels it is a quality of life issue and  wants to continue. I have recommended she try 1 mg daily to see if she can suffice with a lower dose and she agrees to try this whenever cooler weather comes. I did refill her 2 mg daily dose in the event she cannot wean. She'll initially try snapping the tablets in half to 1 mg and see how she does with this. 2. White discharge. Slightly heavier white discharge noted on exam. Did have yeast last year on Pap smear. Patient without symptoms to include itching irritation or odor. No urinary symptoms such as frequency dysuria or urgency. Wet prep is negative excepting a few clue cells. As she is asymptomatic we'll plan expectant management and she will follow up if she develops any symptoms. 3. Fascial weakness upper abdomen right of midline. Stable and asymptomatic to the patient. Patient will continue to monitor and follow up if it becomes an issue. 4. Mammography 12/2015. Continue with annual mammography next year. SBE monthly reviewed. 5. Pap smear 2016. No Pap smear done today. No history of significant abnormal Pap smears. Options to stop screening or less frequent screening intervals reviewed per current screening guidelines. Will readdress on annual basis. 6. Health maintenance. No routine lab work done as patient does this at her primary physician's office. Follow up 1 year, sooner as needed.  15 minutes of my time in excess of her breast and pelvic exam was spent in direct face to face counseling and coordination of care in regards to her problems of vaginal discharge and most current 2017 NAMS HRT guidelines review with HRT discussion.    Anastasio Auerbach MD, 3:43 PM 01/21/2016

## 2016-01-21 NOTE — Patient Instructions (Signed)

## 2016-02-17 ENCOUNTER — Encounter (HOSPITAL_COMMUNITY): Payer: Self-pay | Admitting: Emergency Medicine

## 2016-02-17 ENCOUNTER — Ambulatory Visit (HOSPITAL_COMMUNITY)
Admission: EM | Admit: 2016-02-17 | Discharge: 2016-02-17 | Disposition: A | Discharge status: Home / Self Care | Payer: Medicaid Other | Attending: Family Medicine | Admitting: Family Medicine

## 2016-02-17 DIAGNOSIS — M7662 Achilles tendinitis, left leg: Secondary | ICD-10-CM

## 2016-02-17 MED ORDER — DICLOFENAC SODIUM 1 % TD GEL: 4.0000 g | Freq: Four times a day (QID) | TRANSDERMAL | 3 refills | Status: DC

## 2016-02-17 NOTE — Discharge Instructions (Signed)
Ice, boot and medicine, see dr Percell Miller for recheck.

## 2016-02-17 NOTE — ED Provider Notes (Signed)
[facility]    CSN: [id number] Arrival date & time: 02/17/16  1810     History   Chief Complaint Chief Complaint  Patient presents with  . Foot Pain    HPI Cassandra Campos is a 45 y.o. female.   The history is provided by the patient.  Ankle Pain  Location:  Ankle Time since incident:  3 weeks Injury: no   Ankle location:  L ankle Pain details:    Quality:  Shooting and sharp   Radiates to:  Does not radiate   Severity:  Moderate   Progression:  Worsening Chronicity:  New Dislocation: no   Foreign body present:  No foreign bodies Prior injury to area:  No Relieved by:  None tried Worsened by:  Nothing Ineffective treatments:  None tried Associated symptoms: decreased ROM and stiffness     Past Medical History:  Diagnosis Date  . Anemia   . Chronic kidney disease   . Diabetes mellitus    type 2  . Diabetic retinopathy ([place])   . Fatty liver   . Heart rate fast   . High triglycerides   . Hypercholesteremia   . Hypertension   . Neuropathy ([place])   . Pneumonia   . Sleep apnea    has not started using Cpap  . Umbilical hernia     Patient Active Problem List   Diagnosis Date Noted  . Chronic kidney disease (CKD), stage IV (severe) ([place]) 08/19/2015  . Diabetes mellitus due to underlying condition with other diabetic kidney complication 12/28/4816  . CKD (chronic kidney disease) stage 3, GFR 30-59 ml/min 03/18/2015  . Morbid obesity due to excess calories ([place]) 03/18/2015  . Excessive daytime sleepiness 03/18/2015  . Insomnia w/ sleep apnea 03/18/2015  . Hypersomnia with sleep apnea 03/18/2015  . Bacterial vaginosis 10/11/2012  . Type II or unspecified type diabetes mellitus without mention of complication, uncontrolled   . High triglycerides   . Hypertension     Past Surgical History:  Procedure Laterality Date  . ABDOMINAL HYSTERECTOMY  FEB 2002   TAH/BSO for irregular bleeding and leiomyoma  . AV FISTULA PLACEMENT Left 07/04/2015   Procedure: ARTERIOVENOUS (AV) FISTULA CREATION-LEFT;  Surgeon: Mal Misty, MD;  Location: [facility];  Service: Vascular;  Laterality: Left;  . CARPAL TUNNEL RELEASE     X 2  . CESAREAN SECTION     X 2  . EYE SURGERY Bilateral    Retina reattachment  . FISTULA SUPERFICIALIZATION Left 08/28/2015   Procedure: BRACHIOCEPHALIC FISTULA SUPERFICIALIZATION;  Surgeon: Mal Misty, MD;  Location: [facility];  Service: Vascular;  Laterality: Left;  . OOPHORECTOMY     BSO  . PELVIC LAPAROSCOPY      OB History    Gravida Para Term Preterm AB Living   5 2 2   3 2    SAB TAB Ectopic Multiple Live Births                   Home Medications    Prior to Admission medications   Medication Sig Start Date End Date Taking? Authorizing Provider  amLODipine (NORVASC) 10 MG tablet Take 5 mg by mouth daily.  07/16/15   Historical Provider, MD  Cholecalciferol (VITAMIN D3) 5000 UNITS CAPS Take 5,000 Units by mouth daily.     Historical Provider, MD  diclofenac sodium (VOLTAREN) 1 % GEL Apply 4 g topically 4 (four) times daily. To ankle area--please instruct in dosing. 02/17/16   Billy Fischer,  MD  estradiol (ESTRACE) 2 MG tablet Take 1 tablet by mouth  daily 01/21/16   Anastasio Auerbach, MD  furosemide (LASIX) 80 MG tablet Take 80 mg by mouth 2 (two) times daily.    Historical Provider, MD  gabapentin (NEURONTIN) 300 MG capsule Take 300 mg by mouth at bedtime.     Historical Provider, MD  insulin aspart (NOVOLOG) 100 UNIT/ML injection Inject 30-40 units 3 times a day Patient taking differently: Inject 30-40 Units into the skin 3 (three) times daily with meals. Inject 30-40 units 3 times a day 05/14/13   Elayne Snare, MD  Insulin Pen Needle 32G X 4 MM MISC Inject 1 each into the skin 5 (five) times daily. 02/19/13   Elayne Snare, MD  Insulin Syringe-Needle U-100 (INSULIN SYRINGE .5CC/31GX5/16") 31G X 5/16" 0.5 ML MISC Inject 1 each into the skin 4 (four) times daily. 02/15/13   Elayne Snare, MD  [place] LANCETS 81K  MISC  11/29/12   Historical Provider, MD  potassium chloride (MICRO-K) 10 MEQ CR capsule Take 20 mEq by mouth 2 (two) times daily. 09/19/15   Historical Provider, MD  rosuvastatin (CRESTOR) 20 MG tablet Take 20 mg by mouth at bedtime.     Historical Provider, MD  temazepam (RESTORIL) 30 MG capsule Take 30 mg by mouth at bedtime.  05/30/15   Historical Provider, MD    Family History Family History  Problem Relation Age of Onset  . Diabetes Mother   . Hypertension Mother   . Diabetes Father   . Hypertension Father   . Cancer Father     STOMACH  . Stroke Maternal Grandmother   . Stroke Maternal Grandfather     Social History Social History  Substance Use Topics  . Smoking status: Never Smoker  . Smokeless tobacco: Never Used  . Alcohol use 0.0 oz/week     Comment: occasional     Allergies   Review of patient's allergies indicates no known allergies.   Review of Systems Review of Systems  Musculoskeletal: Positive for gait problem and stiffness.  Skin: Negative.   All other systems reviewed and are negative.    Physical Exam Triage Vital Signs ED Triage Vitals  Enc Vitals Group     BP      Pulse      Resp      Temp      Temp src      SpO2      Weight      Height      Head Circumference      Peak Flow      Pain Score      Pain Loc      Pain Edu?      Excl. in [place]?    No data found.   Updated Vital Signs BP 160/83 (BP Location: Left Arm)   Pulse 99   Temp 98.3 F (36.8 C) (Oral)   Resp 16   SpO2 99%   Visual Acuity Right Eye Distance:   Left Eye Distance:   Bilateral Distance:    Right Eye Near:   Left Eye Near:    Bilateral Near:     Physical Exam  Constitutional: She is oriented to person, place, and time. She appears well-developed and well-nourished.  Musculoskeletal: She exhibits tenderness.       Left ankle: Achilles tendon exhibits pain and abnormal Thompson's test results.       Feet:  Neurological: She is alert and oriented to  person, place, and time.  Nursing note and vitals reviewed.    UC Treatments / Results  Labs (all labs ordered are listed, but only abnormal results are displayed) Labs Reviewed - No data to display  EKG  EKG Interpretation None       Radiology No results found.  Procedures Procedures (including critical care time)  Medications Ordered in UC Medications - No data to display   Initial Impression / Assessment and Plan / UC Course  I have reviewed the triage vital signs and the nursing notes.  Pertinent labs & imaging results that were available during my care of the patient were reviewed by me and considered in my medical decision making (see chart for details).  Clinical Course      Final Clinical Impressions(s) / UC Diagnoses   Final diagnoses:  Achilles tendinitis of left lower extremity    New Prescriptions New Prescriptions   DICLOFENAC SODIUM (VOLTAREN) 1 % GEL    Apply 4 g topically 4 (four) times daily. To ankle area--please instruct in dosing.     Billy Fischer, MD 02/17/16 [phone]

## 2016-02-17 NOTE — ED Triage Notes (Signed)
Pt. Stated, I've had heal pain and lower leg pain for about 3 weeks.

## 2016-02-20 ENCOUNTER — Telehealth: Payer: Self-pay | Admitting: Neurology

## 2016-02-20 NOTE — Telephone Encounter (Signed)
Pt has started a new job and is wanting to try to r/s 1st CPAP appt. I did advise her of the 30-60 day time frame that she has to be seen so ins will pay. She understood. The pt has medicaid so could her appt be pushed out to 90 days? Please call and let her know. She is aware she will not receive a call back until Monday.

## 2016-02-23 NOTE — Telephone Encounter (Signed)
I called pt to discuss. No answer, left a message asking her to call me back. 

## 2016-02-23 NOTE — Telephone Encounter (Signed)
Received this notice from [facility]:  "With her insurance being Medicaid it will be ok to schedule the follow up for after the 90 day mark, as long as it is before February which will be the 6 month mark."

## 2016-02-23 NOTE — Telephone Encounter (Signed)
I spoke to pt. I advised her that Medicaid will need her to follow up before February. Pt is agreeable to a January appt. Pt says that she is having no problems with her cpap. Appt made for 06/08/2015. Pt verbalized understanding.

## 2016-02-25 ENCOUNTER — Ambulatory Visit: Payer: Self-pay | Admitting: Neurology

## 2016-04-06 ENCOUNTER — Ambulatory Visit: Payer: Self-pay | Admitting: Neurology

## 2016-05-08 ENCOUNTER — Other Ambulatory Visit: Payer: Self-pay

## 2016-05-08 ENCOUNTER — Emergency Department (HOSPITAL_COMMUNITY): Payer: Medicaid Other

## 2016-05-08 ENCOUNTER — Encounter (HOSPITAL_COMMUNITY): Payer: Self-pay | Admitting: Emergency Medicine

## 2016-05-08 ENCOUNTER — Ambulatory Visit (HOSPITAL_COMMUNITY)
Admission: EM | Admit: 2016-05-08 | Discharge: 2016-05-08 | Disposition: A | Discharge status: Home / Self Care | Payer: Medicaid Other | Attending: Emergency Medicine | Admitting: Emergency Medicine

## 2016-05-08 ENCOUNTER — Encounter (HOSPITAL_COMMUNITY): Payer: Self-pay | Admitting: *Deleted

## 2016-05-08 ENCOUNTER — Emergency Department (HOSPITAL_COMMUNITY)
Admission: EM | Admit: 2016-05-08 | Discharge: 2016-05-08 | Disposition: A | Discharge status: Home / Self Care | Payer: Medicaid Other | Attending: Emergency Medicine | Admitting: Emergency Medicine

## 2016-05-08 ENCOUNTER — Emergency Department (HOSPITAL_BASED_OUTPATIENT_CLINIC_OR_DEPARTMENT_OTHER): Admit: 2016-05-08 | Discharge: 2016-05-08 | Disposition: A | Discharge status: Home / Self Care | Payer: Medicaid Other

## 2016-05-08 DIAGNOSIS — Z79899 Other long term (current) drug therapy: Secondary | ICD-10-CM | POA: Insufficient documentation

## 2016-05-08 DIAGNOSIS — M79605 Pain in left leg: Secondary | ICD-10-CM | POA: Diagnosis not present

## 2016-05-08 DIAGNOSIS — E1165 Type 2 diabetes mellitus with hyperglycemia: Secondary | ICD-10-CM | POA: Insufficient documentation

## 2016-05-08 DIAGNOSIS — R739 Hyperglycemia, unspecified: Secondary | ICD-10-CM

## 2016-05-08 DIAGNOSIS — E1122 Type 2 diabetes mellitus with diabetic chronic kidney disease: Secondary | ICD-10-CM | POA: Diagnosis not present

## 2016-05-08 DIAGNOSIS — N184 Chronic kidney disease, stage 4 (severe): Secondary | ICD-10-CM | POA: Diagnosis not present

## 2016-05-08 DIAGNOSIS — N179 Acute kidney failure, unspecified: Secondary | ICD-10-CM | POA: Insufficient documentation

## 2016-05-08 DIAGNOSIS — E876 Hypokalemia: Secondary | ICD-10-CM

## 2016-05-08 DIAGNOSIS — M79609 Pain in unspecified limb: Secondary | ICD-10-CM

## 2016-05-08 DIAGNOSIS — R600 Localized edema: Secondary | ICD-10-CM | POA: Insufficient documentation

## 2016-05-08 DIAGNOSIS — Z794 Long term (current) use of insulin: Secondary | ICD-10-CM | POA: Insufficient documentation

## 2016-05-08 DIAGNOSIS — I129 Hypertensive chronic kidney disease with stage 1 through stage 4 chronic kidney disease, or unspecified chronic kidney disease: Secondary | ICD-10-CM | POA: Diagnosis not present

## 2016-05-08 DIAGNOSIS — M79672 Pain in left foot: Secondary | ICD-10-CM | POA: Diagnosis present

## 2016-05-08 DIAGNOSIS — E1140 Type 2 diabetes mellitus with diabetic neuropathy, unspecified: Secondary | ICD-10-CM | POA: Diagnosis not present

## 2016-05-08 LAB — COMPREHENSIVE METABOLIC PANEL
ALT: 23 U/L (ref 14–54)
AST: 26 U/L (ref 15–41)
Albumin: 3.7 g/dL (ref 3.5–5.0)
Alkaline Phosphatase: 55 U/L (ref 38–126)
BUN: 67 mg/dL — AB (ref 6–20)
CHLORIDE: 91 mmol/L — AB (ref 101–111)
CO2: 31 mmol/L (ref 22–32)
Calcium: 9.8 mg/dL (ref 8.9–10.3)
Creatinine, Ser: 3.31 mg/dL — ABNORMAL HIGH (ref 0.44–1.00)
GLUCOSE: 410 mg/dL — AB (ref 65–99)
POTASSIUM: 2.8 mmol/L — AB (ref 3.5–5.1)
Sodium: 138 mmol/L (ref 135–145)
TOTAL PROTEIN: 7.7 g/dL (ref 6.5–8.1)
Total Bilirubin: 0.7 mg/dL (ref 0.3–1.2)

## 2016-05-08 LAB — SEDIMENTATION RATE: SED RATE: 62 mm/h — AB (ref 0–22)

## 2016-05-08 LAB — CBC WITH DIFFERENTIAL/PLATELET
BASOS ABS: 0 10*3/uL (ref 0.0–0.1)
Basophils Relative: 0 %
EOS PCT: 1 %
Eosinophils Absolute: 0.1 10*3/uL (ref 0.0–0.7)
HEMATOCRIT: 33.9 % — AB (ref 36.0–46.0)
Hemoglobin: 11.6 g/dL — ABNORMAL LOW (ref 12.0–15.0)
LYMPHS ABS: 1.9 10*3/uL (ref 0.7–4.0)
LYMPHS PCT: 30 %
MCH: 29.1 pg (ref 26.0–34.0)
MCHC: 34.2 g/dL (ref 30.0–36.0)
MCV: 85.2 fL (ref 78.0–100.0)
MONO ABS: 0.4 10*3/uL (ref 0.1–1.0)
MONOS PCT: 7 %
NEUTROS ABS: 3.9 10*3/uL (ref 1.7–7.7)
Neutrophils Relative %: 62 %
PLATELETS: 274 10*3/uL (ref 150–400)
RBC: 3.98 MIL/uL (ref 3.87–5.11)
RDW: 12.5 % (ref 11.5–15.5)
WBC: 6.3 10*3/uL (ref 4.0–10.5)

## 2016-05-08 LAB — C-REACTIVE PROTEIN: CRP: 3.5 mg/dL — AB (ref <1.0)

## 2016-05-08 LAB — I-STAT CG4 LACTIC ACID, ED
LACTIC ACID, VENOUS: 2.36 mmol/L — AB (ref 0.5–1.9)
Lactic Acid, Venous: 2.55 mmol/L (ref 0.5–1.9)

## 2016-05-08 MED ORDER — HYDROCODONE-ACETAMINOPHEN 5-325 MG PO TABS
2.0000 | ORAL_TABLET | Freq: Once | ORAL | Status: DC
Start: 2016-05-08 — End: 2016-05-09

## 2016-05-08 MED ORDER — HYDROCODONE-ACETAMINOPHEN 5-325 MG PO TABS
ORAL_TABLET | ORAL | Status: AC
  Filled 2016-05-08: qty 1

## 2016-05-08 MED ORDER — HYDROCODONE-ACETAMINOPHEN 5-325 MG PO TABS
1.0000 | ORAL_TABLET | Freq: Once | ORAL | Status: AC
Start: 2016-05-08 — End: 2016-05-08
  Administered 2016-05-08: 1 via ORAL

## 2016-05-08 MED ORDER — HYDROMORPHONE HCL 2 MG/ML IJ SOLN
0.5000 mg | Freq: Once | INTRAMUSCULAR | Status: AC
  Administered 2016-05-08: 0.5 mg via INTRAVENOUS
  Filled 2016-05-08: qty 1

## 2016-05-08 MED ORDER — SODIUM CHLORIDE 0.9 % IV BOLUS (SEPSIS)
500.0000 mL | Freq: Once | INTRAVENOUS | Status: AC
  Administered 2016-05-08: 500 mL via INTRAVENOUS

## 2016-05-08 MED ORDER — POTASSIUM CHLORIDE CRYS ER 20 MEQ PO TBCR
60.0000 meq | EXTENDED_RELEASE_TABLET | Freq: Once | ORAL | Status: AC
  Administered 2016-05-08: 60 meq via ORAL
  Filled 2016-05-08: qty 3

## 2016-05-08 MED ORDER — FENTANYL CITRATE (PF) 100 MCG/2ML IJ SOLN
50.0000 ug | INTRAMUSCULAR | Status: DC | PRN
  Administered 2016-05-08: 50 ug via INTRAVENOUS
  Filled 2016-05-08: qty 2

## 2016-05-08 MED ORDER — HYDROCODONE-ACETAMINOPHEN 5-325 MG PO TABS: 2.0000 | ORAL_TABLET | ORAL | 0 refills | Status: DC | PRN

## 2016-05-08 NOTE — Progress Notes (Signed)
Orthopedic Tech Progress Note Patient Details:  Cassandra Campos June 04, 1970 [id number]  Ortho Devices Type of Ortho Device: Crutches Ortho Device/Splint Interventions: Application   [place] Pink 05/08/2016, 9:51 PM

## 2016-05-08 NOTE — ED Triage Notes (Signed)
Pt c/o pain in her L foot, started Tuesday, getting worse, pt states she cant walk on it and its swollen. Denies injury. LLE warm with cap refill <3 sec. Pt sent here from UC for further evaluation.

## 2016-05-08 NOTE — ED Notes (Signed)
MD Reather Converse notified of elevated lactic

## 2016-05-08 NOTE — ED Provider Notes (Signed)
[facility] DEPT Provider Note   CSN: [id number] Arrival date & time: 05/08/16  1348     History   Chief Complaint Chief Complaint  Patient presents with  . Foot Pain    HPI Cassandra Campos is a 45 y.o. female.  Patient with chronic kidney disease, diabetes, obesity, high blood pressure presents from urgent care for worsening left ankle and foot pain with leg swelling for the past 4 days. No injury, no blood clot risk factors except for estrogen in the past. Pain with any palpation range of motion. No fevers or chills. No history of joint disease or joint infection.      Past Medical History:  Diagnosis Date  . Anemia   . Chronic kidney disease   . Diabetes mellitus    type 2  . Diabetic retinopathy ([place])   . Fatty liver   . Heart rate fast   . High triglycerides   . Hypercholesteremia   . Hypertension   . Neuropathy ([place])   . Pneumonia   . Sleep apnea    has not started using Cpap  . Stage 4 chronic kidney disease ([place])   . Umbilical hernia     Patient Active Problem List   Diagnosis Date Noted  . Chronic kidney disease (CKD), stage IV (severe) ([place]) 08/19/2015  . Diabetes mellitus due to underlying condition with other diabetic kidney complication 01/60/1093  . CKD (chronic kidney disease) stage 3, GFR 30-59 ml/min 03/18/2015  . Morbid obesity due to excess calories ([place]) 03/18/2015  . Excessive daytime sleepiness 03/18/2015  . Insomnia w/ sleep apnea 03/18/2015  . Hypersomnia with sleep apnea 03/18/2015  . Bacterial vaginosis 10/11/2012  . Type II or unspecified type diabetes mellitus without mention of complication, uncontrolled   . High triglycerides   . Hypertension     Past Surgical History:  Procedure Laterality Date  . ABDOMINAL HYSTERECTOMY  FEB 2002   TAH/BSO for irregular bleeding and leiomyoma  . AV FISTULA PLACEMENT Left 07/04/2015   Procedure: ARTERIOVENOUS (AV) FISTULA CREATION-LEFT;  Surgeon: Mal Misty, MD;  Location: [facility];   Service: Vascular;  Laterality: Left;  . CARPAL TUNNEL RELEASE     X 2  . CESAREAN SECTION     X 2  . EYE SURGERY Bilateral    Retina reattachment  . FISTULA SUPERFICIALIZATION Left 08/28/2015   Procedure: BRACHIOCEPHALIC FISTULA SUPERFICIALIZATION;  Surgeon: Mal Misty, MD;  Location: [facility];  Service: Vascular;  Laterality: Left;  . OOPHORECTOMY     BSO  . PELVIC LAPAROSCOPY      OB History    Gravida Para Term Preterm AB Living   5 2 2   3 2    SAB TAB Ectopic Multiple Live Births                   Home Medications    Prior to Admission medications   Medication Sig Start Date End Date Taking? Authorizing Provider  amLODipine (NORVASC) 10 MG tablet Take 5 mg by mouth daily.  07/16/15   Historical Provider, MD  Cholecalciferol (VITAMIN D3) 5000 UNITS CAPS Take 5,000 Units by mouth daily.     Historical Provider, MD  furosemide (LASIX) 80 MG tablet Take 80 mg by mouth 2 (two) times daily.    Historical Provider, MD  gabapentin (NEURONTIN) 300 MG capsule Take 300 mg by mouth at bedtime.     Historical Provider, MD  insulin aspart (NOVOLOG) 100 UNIT/ML injection Inject 30-40 units 3 times  a day Patient taking differently: Inject 30-40 Units into the skin 3 (three) times daily with meals. Inject 30-40 units 3 times a day 05/14/13   Elayne Snare, MD  Insulin Pen Needle 32G X 4 MM MISC Inject 1 each into the skin 5 (five) times daily. 02/19/13   Elayne Snare, MD  Insulin Syringe-Needle U-100 (INSULIN SYRINGE .5CC/31GX5/16") 31G X 5/16" 0.5 ML MISC Inject 1 each into the skin 4 (four) times daily. 02/15/13   Elayne Snare, MD  IRON PO Take by mouth.    Historical Provider, MD  MINOCYCLINE HCL PO Take by mouth.    Historical Provider, MD  Jonetta Speak LANCETS 95M [place]  11/29/12   Historical Provider, MD  potassium chloride (MICRO-K) 10 MEQ CR capsule Take 20 mEq by mouth 2 (two) times daily. 09/19/15   Historical Provider, MD  temazepam (RESTORIL) 30 MG capsule Take 30 mg by mouth at bedtime.   05/30/15   Historical Provider, MD  UNKNOWN TO PATIENT Cholesterol med; another diuretic    Historical Provider, MD    Family History Family History  Problem Relation Age of Onset  . Diabetes Mother   . Hypertension Mother   . Diabetes Father   . Hypertension Father   . Cancer Father     STOMACH  . Stroke Maternal Grandmother   . Stroke Maternal Grandfather     Social History Social History  Substance Use Topics  . Smoking status: Never Smoker  . Smokeless tobacco: Never Used  . Alcohol use 0.0 oz/week     Comment: occasional     Allergies   Patient has no known allergies.   Review of Systems Review of Systems  Constitutional: Positive for appetite change. Negative for chills and fever.  HENT: Negative for congestion.   Eyes: Negative for visual disturbance.  Respiratory: Negative for shortness of breath.   Cardiovascular: Negative for chest pain.  Gastrointestinal: Negative for abdominal pain and vomiting.  Genitourinary: Negative for dysuria and flank pain.  Musculoskeletal: Positive for gait problem and joint swelling. Negative for back pain, neck pain and neck stiffness.  Skin: Negative for rash.  Neurological: Negative for light-headedness and headaches.     Physical Exam Updated Vital Signs BP 154/89 (BP Location: Left Arm)   Pulse 109   Temp 98.1 F (36.7 C) (Oral)   Resp 20   Ht 5' 2.5" (1.588 m)   Wt 232 lb (105.2 kg)   SpO2 99%   BMI 41.76 kg/m   Physical Exam  Constitutional: She appears well-developed and well-nourished. No distress.  HENT:  Head: Normocephalic and atraumatic.  Eyes: Conjunctivae are normal.  Neck: Neck supple.  Cardiovascular: Normal rate and regular rhythm.   No murmur heard. Pulmonary/Chest: Effort normal and breath sounds normal. No respiratory distress.  Abdominal: Soft. There is no tenderness.  Musculoskeletal: She exhibits edema and tenderness. She exhibits no deformity.  Patient has moderate tenderness left  lateral ankle region and with flexion or extension of the left ankle. Mild tenderness left lateral foot. No focal calf tenderness mild edema left lower extremity. Patient has 2+ distal pulses left lower extremity. Mild dry skin no cellulitis.  Neurological: She is alert.  Skin: Skin is warm and dry. No rash noted. No erythema.  Psychiatric: She has a normal mood and affect.  Nursing note and vitals reviewed.    ED Treatments / Results  Labs (all labs ordered are listed, but only abnormal results are displayed) Labs Reviewed  COMPREHENSIVE METABOLIC PANEL -  Abnormal; Notable for the following:       Result Value   Potassium 2.8 (*)    Chloride 91 (*)    Glucose, Bld 410 (*)    BUN 67 (*)    Creatinine, Ser 3.31 (*)    All other components within normal limits  CBC WITH DIFFERENTIAL/PLATELET - Abnormal; Notable for the following:    Hemoglobin 11.6 (*)    HCT 33.9 (*)    All other components within normal limits  I-STAT CG4 LACTIC ACID, ED - Abnormal; Notable for the following:    Lactic Acid, Venous 2.36 (*)    All other components within normal limits  URINALYSIS, ROUTINE W REFLEX MICROSCOPIC  SEDIMENTATION RATE  C-REACTIVE PROTEIN  I-STAT CG4 LACTIC ACID, ED    EKG  EKG Interpretation None       Radiology No results found.  Procedures Procedures (including critical care time) EMERGENCY DEPARTMENT US SOFT TISSUE INTERPRETATION "Study: Limited Soft Tissue Ultrasound"  INDICATIONS: Pain Multiple views of the body part were obtained in real-time with a multi-frequency linear probe PERFORMED BY:  Myself IMAGES ARCHIVED?: Yes SIDE:Left BODY PART:Lower extremity FINDINGS: No abcess noted and Cellulitis absent INTERPRETATION:  No abcess noted and No cellulitis notedNo joint effusion   CPT: Neck 42353-61  Upper extremity 44315-40  Axilla 08676-19  Chest wall 50932-67  Beast 12458-09  Upper back 98338-25  Lower back 05397-67  Abdominal wall  34193-79  Pelvic wall 02409-73  Lower extremity 53299-24  Other soft tissue 26834-19   Medications Ordered in ED Medications  HYDROcodone-acetaminophen (NORCO/VICODIN) 5-325 MG per tablet 2 tablet (not administered)  sodium chloride 0.9 % bolus 500 mL (not administered)  potassium chloride SA (K-DUR,KLOR-CON) CR tablet 60 mEq (not administered)  fentaNYL (SUBLIMAZE) injection 50 mcg (not administered)     Initial Impression / Assessment and Plan / ED Course  I have reviewed the triage vital signs and the nursing notes.  Pertinent labs & imaging results that were available during my care of the patient were reviewed by me and considered in my medical decision making (see chart for details).  Clinical Course    Patient presents with worsening left ankle and foot tenderness. Patient low risk DVT, ultrasound obtained negative per verbal report. Pain meds given the ER. Very low concern for septic joint, bedside ultrasound no ankle effusion. No cellulitis, no fever no what blood cell count. Patient has uncontrolled diabetes. Potassium low oral potassium given. Patient will need follow-up with orthopedics and primary doctor.  Patient improved on reassessment. Ankle splint and crutches. Close follow-up with nephrology for which she has been following for mild worsening kidney function. Discussed importance of potassium and recheck. Discussed orthopedic follow-up. Patient understands after the holiday she has multiple appointments and she can return for worsening symptoms.  Results and differential diagnosis were discussed with the patient/parent/guardian. Xrays were independently reviewed by myself.  Close follow up outpatient was discussed, comfortable with the plan.   Medications  HYDROcodone-acetaminophen (NORCO/VICODIN) 5-325 MG per tablet 2 tablet (not administered)  fentaNYL (SUBLIMAZE) injection 50 mcg (50 mcg Intravenous Given 05/08/16 1931)  sodium chloride 0.9 % bolus 500 mL (0  mLs Intravenous Stopped 05/08/16 2006)  potassium chloride SA (K-DUR,KLOR-CON) CR tablet 60 mEq (60 mEq Oral Given 05/08/16 1931)  HYDROmorphone (DILAUDID) injection 0.5 mg (0.5 mg Intravenous Given 05/08/16 2110)    Vitals:   05/08/16 1815 05/08/16 1830 05/08/16 1845 05/08/16 1900  BP: 134/83 144/79 145/80 130/68  Pulse: 93 92 103 95  Resp:      Temp:      TempSrc:      SpO2: 100% 100% 99% 100%  Weight:      Height:        Final diagnoses:  Acute foot pain, left  Leg edema, left  Acute renal failure, unspecified acute renal failure type (HCC)  Hyperglycemia  Hypokalemia    Final Clinical Impressions(s) / ED Diagnoses   Final diagnoses:  Acute foot pain, left  Leg edema, left  Acute renal failure, unspecified acute renal failure type ([place])  Hyperglycemia    New Prescriptions New Prescriptions   No medications on file     Elnora Morrison, MD 05/08/16 2137

## 2016-05-08 NOTE — ED Triage Notes (Signed)
Started 4 days ago with left foot pain which has progressively gotten worse.  As of last night, has been unable to bear any weight on left foot.  No known fevers at home.  All LLE toes warm, with prompt cap refill.  C/O numbness in toes starting today.  Left foot, ankle and distal lower leg swollen.

## 2016-05-08 NOTE — Discharge Instructions (Signed)
Please go directly to [facility] emergency room. I'm concerned about something going on in your foot given your degree of pain. I'm not able to evaluate this adequately here. We gave you a pain pill here.

## 2016-05-08 NOTE — Progress Notes (Signed)
VASCULAR LAB PRELIMINARY  PRELIMINARY  PRELIMINARY  PRELIMINARY  Left lower extremity venous duplex completed.    Preliminary report:  There is no DVT or SVT noted in the left lower extremity.  Gave report to Dr. Lesia Sago, [facility], RVT 05/08/2016, 7:44 PM

## 2016-05-08 NOTE — ED Notes (Signed)
Pt and family understood dc material. NAD noted. Scripts given at dc 

## 2016-05-08 NOTE — ED Provider Notes (Signed)
[facility]    CSN: [id number] Arrival date & time: 05/08/16  1213     History   Chief Complaint Chief Complaint  Patient presents with  . Foot Pain    HPI Cassandra Campos is a 45 y.o. female.   HPI She is a 45 year old woman here for evaluation of left foot pain. This started 4 days ago with some pain across the anterior ankle and lateral foot. It has gradually been worsening. She denies any injury or trauma. She has not rolled her ankle or done anything differently. Last night, the pain became so severe that she is unable to bear weight on it. The pain is always there.  She states it feels like her foot is ripping apart. She states it feels swollen on the inside, but denies visible swelling. No fevers. She has not taken any medication as she cannot take NSAIDs due to kidney problems.  Past Medical History:  Diagnosis Date  . Anemia   . Chronic kidney disease   . Diabetes mellitus    type 2  . Diabetic retinopathy ([place])   . Fatty liver   . Heart rate fast   . High triglycerides   . Hypercholesteremia   . Hypertension   . Neuropathy ([place])   . Pneumonia   . Sleep apnea    has not started using Cpap  . Stage 4 chronic kidney disease ([place])   . Umbilical hernia     Patient Active Problem List   Diagnosis Date Noted  . Chronic kidney disease (CKD), stage IV (severe) ([place]) 08/19/2015  . Diabetes mellitus due to underlying condition with other diabetic kidney complication 42/59/5638  . CKD (chronic kidney disease) stage 3, GFR 30-59 ml/min 03/18/2015  . Morbid obesity due to excess calories ([place]) 03/18/2015  . Excessive daytime sleepiness 03/18/2015  . Insomnia w/ sleep apnea 03/18/2015  . Hypersomnia with sleep apnea 03/18/2015  . Bacterial vaginosis 10/11/2012  . Type II or unspecified type diabetes mellitus without mention of complication, uncontrolled   . High triglycerides   . Hypertension     Past Surgical History:  Procedure Laterality Date  .  ABDOMINAL HYSTERECTOMY  FEB 2002   TAH/BSO for irregular bleeding and leiomyoma  . AV FISTULA PLACEMENT Left 07/04/2015   Procedure: ARTERIOVENOUS (AV) FISTULA CREATION-LEFT;  Surgeon: Mal Misty, MD;  Location: [facility];  Service: Vascular;  Laterality: Left;  . CARPAL TUNNEL RELEASE     X 2  . CESAREAN SECTION     X 2  . EYE SURGERY Bilateral    Retina reattachment  . FISTULA SUPERFICIALIZATION Left 08/28/2015   Procedure: BRACHIOCEPHALIC FISTULA SUPERFICIALIZATION;  Surgeon: Mal Misty, MD;  Location: [facility];  Service: Vascular;  Laterality: Left;  . OOPHORECTOMY     BSO  . PELVIC LAPAROSCOPY      OB History    Gravida Para Term Preterm AB Living   5 2 2   3 2    SAB TAB Ectopic Multiple Live Births                   Home Medications    Prior to Admission medications   Medication Sig Start Date End Date Taking? Authorizing Provider  amLODipine (NORVASC) 10 MG tablet Take 5 mg by mouth daily.  07/16/15  Yes Historical Provider, MD  Cholecalciferol (VITAMIN D3) 5000 UNITS CAPS Take 5,000 Units by mouth daily.    Yes Historical Provider, MD  furosemide (LASIX) 80 MG tablet Take  80 mg by mouth 2 (two) times daily.   Yes Historical Provider, MD  gabapentin (NEURONTIN) 300 MG capsule Take 300 mg by mouth at bedtime.    Yes Historical Provider, MD  insulin aspart (NOVOLOG) 100 UNIT/ML injection Inject 30-40 units 3 times a day Patient taking differently: Inject 30-40 Units into the skin 3 (three) times daily with meals. Inject 30-40 units 3 times a day 05/14/13  Yes Elayne Snare, MD  Insulin Pen Needle 32G X 4 MM MISC Inject 1 each into the skin 5 (five) times daily. 02/19/13  Yes Elayne Snare, MD  Insulin Syringe-Needle U-100 (INSULIN SYRINGE .5CC/31GX5/16") 31G X 5/16" 0.5 ML MISC Inject 1 each into the skin 4 (four) times daily. 02/15/13  Yes Elayne Snare, MD  IRON PO Take by mouth.   Yes Historical Provider, MD  MINOCYCLINE HCL PO Take by mouth.   Yes Historical Provider, MD  Jonetta Speak LANCETS [address]O [place]  11/29/12  Yes Historical Provider, MD  potassium chloride (MICRO-K) 10 MEQ CR capsule Take 20 mEq by mouth 2 (two) times daily. 09/19/15  Yes Historical Provider, MD  UNKNOWN TO PATIENT Cholesterol med; another diuretic   Yes Historical Provider, MD  temazepam (RESTORIL) 30 MG capsule Take 30 mg by mouth at bedtime.  05/30/15   Historical Provider, MD    Family History Family History  Problem Relation Age of Onset  . Diabetes Mother   . Hypertension Mother   . Diabetes Father   . Hypertension Father   . Cancer Father     STOMACH  . Stroke Maternal Grandmother   . Stroke Maternal Grandfather     Social History Social History  Substance Use Topics  . Smoking status: Never Smoker  . Smokeless tobacco: Never Used  . Alcohol use 0.0 oz/week     Comment: occasional     Allergies   Patient has no known allergies.   Review of Systems Review of Systems As in history of present illness  Physical Exam Triage Vital Signs ED Triage Vitals  Enc Vitals Group     BP 05/08/16 1303 141/85     Pulse Rate 05/08/16 1303 83     Resp 05/08/16 1303 18     Temp 05/08/16 1303 99.3 F (37.4 C)     Temp Source 05/08/16 1303 Oral     SpO2 05/08/16 1303 98 %     Weight --      Height --      Head Circumference --      [place] Flow --      Pain Score 05/08/16 1309 10     Pain Loc --      Pain Edu? --      Excl. in [place]? --    No data found.   Updated Vital Signs BP 141/85 (BP Location: Right Arm)   Pulse 83   Temp 99.3 F (37.4 C) (Oral)   Resp 18   SpO2 98%   Visual Acuity Right Eye Distance:   Left Eye Distance:   Bilateral Distance:    Right Eye Near:   Left Eye Near:    Bilateral Near:     Physical Exam  Constitutional: She is oriented to person, place, and time. She appears well-developed and well-nourished. She appears distressed.  Patient shifting with pain and rubbing at the left leg.  Cardiovascular: Normal rate.   Pulmonary/Chest: Effort  normal.  Musculoskeletal:  Left foot: No erythema or edema. She does have some dry peeling  skin, but this is baseline for her. 2+ DP pulse. She is exquisitely tender to palpation over the anterior lateral ankle and lateral foot. No obvious injury. Limited range of motion of the lateral toes.  Neurological: She is alert and oriented to person, place, and time.     UC Treatments / Results  Labs (all labs ordered are listed, but only abnormal results are displayed) Labs Reviewed - No data to display  EKG  EKG Interpretation None       Radiology No results found.  Procedures Procedures (including critical care time)  Medications Ordered in UC Medications  HYDROcodone-acetaminophen (NORCO/VICODIN) 5-325 MG per tablet 1 tablet (1 tablet Oral Given 05/08/16 1335)     Initial Impression / Assessment and Plan / UC Course  I have reviewed the triage vital signs and the nursing notes.  Pertinent labs & imaging results that were available during my care of the patient were reviewed by me and considered in my medical decision making (see chart for details).  Clinical Course     She appears to be in quite significant pain. I do not have a clear etiology for this pain with no history of trauma and palpable pulses. She has no calf tenderness or swelling to suggest DVT. No erythema to suggest cellulitis. Will give hydrocodone here and transfer to the emergency room for additional evaluation and management.  Final Clinical Impressions(s) / UC Diagnoses   Final diagnoses:  Pain of left lower extremity    New Prescriptions Current Discharge Medication List       Melony Overly, MD 05/08/16 1339

## 2016-05-08 NOTE — Discharge Instructions (Signed)
See your primary doctor and nephrologist after the holiday. Take our potassium pills and have K rechecked next week.  For severe pain take norco or vicodin however realize they have the potential for addiction and it can make you sleepy and has tylenol in it.  No operating machinery while taking.  See ortho doctor next week.  Crutches as needed. If you were given medicines take as directed.  If you are on coumadin or contraceptives realize their levels and effectiveness is altered by many different medicines.  If you have any reaction (rash, tongues swelling, other) to the medicines stop taking and see a physician.    If your blood pressure was elevated in the ER make sure you follow up for management with a primary doctor or return for chest pain, shortness of breath or stroke symptoms.  Please follow up as directed and return to the ER or see a physician for new or worsening symptoms.  Thank you. Vitals:   05/08/16 1815 05/08/16 1830 05/08/16 1845 05/08/16 1900  BP: 134/83 144/79 145/80 130/68  Pulse: 93 92 103 95  Resp:      Temp:      TempSrc:      SpO2: 100% 100% 99% 100%  Weight:      Height:

## 2016-05-13 ENCOUNTER — Ambulatory Visit (INDEPENDENT_AMBULATORY_CARE_PROVIDER_SITE_OTHER): Payer: Medicaid Other | Admitting: Orthopedic Surgery

## 2016-05-13 ENCOUNTER — Encounter (INDEPENDENT_AMBULATORY_CARE_PROVIDER_SITE_OTHER): Payer: Self-pay | Admitting: Orthopedic Surgery

## 2016-05-13 VITALS — Ht 62.5 in | Wt 232.0 lb

## 2016-05-13 DIAGNOSIS — M1009 Idiopathic gout, multiple sites: Secondary | ICD-10-CM | POA: Diagnosis not present

## 2016-05-13 MED ORDER — COLCHICINE 0.6 MG PO TABS: 0.6000 mg | ORAL_TABLET | Freq: Two times a day (BID) | ORAL | 3 refills | Status: DC

## 2016-05-13 MED ORDER — ALLOPURINOL 100 MG PO TABS: 100.0000 mg | ORAL_TABLET | Freq: Two times a day (BID) | ORAL | 3 refills | Status: DC

## 2016-05-13 NOTE — Progress Notes (Signed)
Office Visit Note   Patient: Cassandra Campos           Date of Birth: 1970/08/23           MRN: [id number] Visit Date: 05/13/2016              Requested by: Glendale Chard, MD [address] [place], [place] [address] PCP: Maximino Greenland, MD   Assessment & Plan: Visit Diagnoses:  1. Acute idiopathic gout of multiple sites     Plan: We'll draw uric acid level and start patient on colchicine and allopurinol. Patient has no signs or symptoms of infection but is exquisite pain and swelling around the great toe MTP joint consistent with acute gout.  Follow-Up Instructions: Return in about 2 weeks (around 05/27/2016).   Orders:  Orders Placed This Encounter  Procedures  . Uric acid   Meds ordered this encounter  Medications  . allopurinol (ZYLOPRIM) 100 MG tablet    Sig: Take 1 tablet (100 mg total) by mouth 2 (two) times daily.    Dispense:  60 tablet    Refill:  3  . colchicine 0.6 MG tablet    Sig: Take 1 tablet (0.6 mg total) by mouth 2 (two) times daily. Take BID for acute gout attack, stop when acute pain resolves    Dispense:  60 tablet    Refill:  3      Procedures: No procedures performed   Clinical Data: No additional findings.   Subjective: Chief Complaint  Patient presents with  . Left Foot - Pain    Patient presents for left foot pain. She has been having persistent pain left foot for a while and had to go to emergency room due to left foot pain on 05/08/16. She had xrays done which were negative for fracture but did show calcaneal spur. She is also having pain in lower back, due to shifting weight due to pain. She has been nonweightbearing with crutches for a week. She states most of her pain is medial side of left great toe, and states she feels like its swelling from the inside.     Review of Systems patient was seen and chew and was worked up with results negative for infection she states she had a Doppler that was negative and radiographs  were also negative. She states she's been on crutches since Friday pain started acutely over the lateral border of the left foot but is now worse at the MTP joint left great toe. She states that due to using crutches she has developed some back pain.  Of note patient does have diabetic insensate neuropathy chronic kidney disease and has a AV fistula placed in the left upper extremity.  Objective: Vital Signs: Ht 5' 2.5" (1.588 m)   Wt 232 lb (105.2 kg)   BMI 41.76 kg/m   Physical Exam examination patient is alert oriented no adenopathy well-dressed normal affect normal respiratory effort she does have an antalgic gait. Patient has exquisite tenderness to palpation with light touch over the great toe MTP joint. There is swelling there is no redness no cellulitis no tophaceous deposits no signs of infection no signs of chronic gout.  Ortho Exam  Specialty Comments:  No specialty comments available.  Imaging: No results found.   PMFS History: Patient Active Problem List   Diagnosis Date Noted  . Acute idiopathic gout of multiple sites 05/13/2016  . Chronic kidney disease (CKD), stage IV (severe) ([facility]) 08/19/2015  . Diabetes mellitus  due to underlying condition with other diabetic kidney complication 78/29/5621  . CKD (chronic kidney disease) stage 3, GFR 30-59 ml/min 03/18/2015  . Morbid obesity due to excess calories ([facility]) 03/18/2015  . Excessive daytime sleepiness 03/18/2015  . Insomnia w/ sleep apnea 03/18/2015  . Hypersomnia with sleep apnea 03/18/2015  . Bacterial vaginosis 10/11/2012  . Type II or unspecified type diabetes mellitus without mention of complication, uncontrolled   . High triglycerides   . Hypertension    Past Medical History:  Diagnosis Date  . Anemia   . Chronic kidney disease   . Diabetes mellitus    type 2  . Diabetic retinopathy ([facility])   . Fatty liver   . Heart rate fast   . High triglycerides   . Hypercholesteremia   . Hypertension   . Neuropathy  ([facility])   . Pneumonia   . Sleep apnea    has not started using Cpap  . Stage 4 chronic kidney disease ([facility])   . Umbilical hernia     Family History  Problem Relation Age of Onset  . Diabetes Mother   . Hypertension Mother   . Diabetes Father   . Hypertension Father   . Cancer Father     STOMACH  . Stroke Maternal Grandmother   . Stroke Maternal Grandfather     Past Surgical History:  Procedure Laterality Date  . ABDOMINAL HYSTERECTOMY  FEB 2002   TAH/BSO for irregular bleeding and leiomyoma  . AV FISTULA PLACEMENT Left 07/04/2015   Procedure: ARTERIOVENOUS (AV) FISTULA CREATION-LEFT;  Surgeon: Mal Misty, MD;  Location: [facility];  Service: Vascular;  Laterality: Left;  . CARPAL TUNNEL RELEASE     X 2  . CESAREAN SECTION     X 2  . EYE SURGERY Bilateral    Retina reattachment  . FISTULA SUPERFICIALIZATION Left 08/28/2015   Procedure: BRACHIOCEPHALIC FISTULA SUPERFICIALIZATION;  Surgeon: Mal Misty, MD;  Location: [facility];  Service: Vascular;  Laterality: Left;  . OOPHORECTOMY     BSO  . PELVIC LAPAROSCOPY     Social History   Occupational History  . Not on file.   Social History Main Topics  . Smoking status: Never Smoker  . Smokeless tobacco: Never Used  . Alcohol use 0.0 oz/week     Comment: occasional  . Drug use: No  . Sexual activity: Not on file     Comment: HYST-1st intercourse 45 yo-Fewer than 5 partners

## 2016-05-14 ENCOUNTER — Telehealth (INDEPENDENT_AMBULATORY_CARE_PROVIDER_SITE_OTHER): Payer: Self-pay | Admitting: Radiology

## 2016-05-14 ENCOUNTER — Other Ambulatory Visit (INDEPENDENT_AMBULATORY_CARE_PROVIDER_SITE_OTHER): Payer: Self-pay | Admitting: Orthopedic Surgery

## 2016-05-14 LAB — URIC ACID: URIC ACID, SERUM: 15.2 mg/dL — AB (ref 2.5–7.0)

## 2016-05-14 NOTE — Telephone Encounter (Signed)
I called patient to advise lab results, and she was not able to obtain colchicine due to insurance, pending prior auth, asked pharmacy to please refax information. Samples given from [facility] two week supply to hold her over. Advised her these are at the front desk. I have hand written medication directions. Will follow up on prior authorization next week.

## 2016-05-14 NOTE — Telephone Encounter (Signed)
Cassandra Campos from [facility] called with an alert high. Patient's uric acid is 15.2.  This was confirmed with automatic dilution. She will fax results as well.

## 2016-05-17 DIAGNOSIS — G4733 Obstructive sleep apnea (adult) (pediatric): Secondary | ICD-10-CM | POA: Diagnosis not present

## 2016-05-18 NOTE — Telephone Encounter (Signed)
I called [facility] spoke with Cassandra Campos. She informed me colchicine capsules are preferred drug by medicaid. And written in capsule form medication would be approved and not require prior authorization. There is only a 3 dollar co-pay. Out of pocket price is 260-270, 5 dollars a pill. I called patient and she is feeling better she has follow up on January 11th when we will recheck her blood work.

## 2016-05-27 ENCOUNTER — Ambulatory Visit (INDEPENDENT_AMBULATORY_CARE_PROVIDER_SITE_OTHER): Payer: Medicaid Other | Admitting: Family

## 2016-05-27 ENCOUNTER — Encounter (INDEPENDENT_AMBULATORY_CARE_PROVIDER_SITE_OTHER): Payer: Self-pay | Admitting: Orthopedic Surgery

## 2016-05-27 VITALS — Ht 62.5 in | Wt 232.0 lb

## 2016-05-27 DIAGNOSIS — M545 Low back pain, unspecified: Secondary | ICD-10-CM

## 2016-05-27 DIAGNOSIS — M1009 Idiopathic gout, multiple sites: Secondary | ICD-10-CM

## 2016-05-27 NOTE — Progress Notes (Signed)
Office Visit Note   Patient: Cassandra Campos           Date of Birth: 1970-07-14           MRN: [id number] Visit Date: 05/27/2016              Requested by: Glendale Chard, MD [address] [place], [place] [address] PCP: Maximino Greenland, MD  Chief Complaint  Patient presents with  . Left Foot - Follow-up    HPI: Patient is a 46 year old woman who is seen today in follow-up for left foot pain associated with gout. She is doing remarkably better. Her prior uric acid 05/13/16 was a 15.2 she was placed on allopurinol 100mg  1 po bid and given colchicine capsules 0.6mg  1 po bid. She had a follow up appointment with her nephrologist and her uric acid was drawn again on 05/25/16. It had dropped down to a 10.5. Her nephrologist has adjusted allopurinol to 300mg  1 po bid and recommended she stay on colchicine for a year. Her back pain has also resolved after her medical doctor corrected a potassium imbalance. No concerns voiced today.    Assessment & Plan: Visit Diagnoses:  1. Acute idiopathic gout of multiple sites   2. Acute bilateral low back pain without sciatica     Plan: back pain is resolved. Gout is well-controlled today.Patient will follow with her nephrologist as well as her medical doctor for control of her gout as well as low back pain. Discussed that she will follow up with [place] in the office as needed. Patient comfortable with the plan.  Follow-Up Instructions: Return if symptoms worsen or fail to improve.   Ortho Exam Patient is alert and oriented. No adenopathy. Well-dressed. Normal affect. Respirations easy. Steady gait. Low back: No spinous process tenderness. No soft tissue tenderness. No pain with range of motion. No radicular symptoms. Left foot:No erythema ulcers or open areas. minimal swelling. There is no bony tenderness.  Imaging: No results found.  Orders:  No orders of the defined types were placed in this encounter.  No orders of the defined types  were placed in this encounter.    Procedures: No procedures performed  Clinical Data: No additional findings.  Subjective: Review of Systems  Constitutional: Negative for chills and fever.  Musculoskeletal: Negative for arthralgias, joint swelling and myalgias.  Skin: Negative for wound.    Objective: Vital Signs: Ht 5' 2.5" (1.588 m)   Wt 232 lb (105.2 kg)   BMI 41.76 kg/m   Specialty Comments:  No specialty comments available.  PMFS History: Patient Active Problem List   Diagnosis Date Noted  . Acute idiopathic gout of multiple sites 05/13/2016  . Chronic kidney disease (CKD), stage IV (severe) ([facility]) 08/19/2015  . Diabetes mellitus due to underlying condition with other diabetic kidney complication 97/67/3419  . CKD (chronic kidney disease) stage 3, GFR 30-59 ml/min 03/18/2015  . Morbid obesity due to excess calories ([facility]) 03/18/2015  . Excessive daytime sleepiness 03/18/2015  . Insomnia w/ sleep apnea 03/18/2015  . Hypersomnia with sleep apnea 03/18/2015  . Bacterial vaginosis 10/11/2012  . Type II or unspecified type diabetes mellitus without mention of complication, uncontrolled   . High triglycerides   . Hypertension    Past Medical History:  Diagnosis Date  . Anemia   . Chronic kidney disease   . Diabetes mellitus    type 2  . Diabetic retinopathy ([facility])   . Fatty liver   . Heart rate fast   .  High triglycerides   . Hypercholesteremia   . Hypertension   . Neuropathy ([facility])   . Pneumonia   . Sleep apnea    has not started using Cpap  . Stage 4 chronic kidney disease ([facility])   . Umbilical hernia     Family History  Problem Relation Age of Onset  . Diabetes Mother   . Hypertension Mother   . Diabetes Father   . Hypertension Father   . Cancer Father     STOMACH  . Stroke Maternal Grandmother   . Stroke Maternal Grandfather     Past Surgical History:  Procedure Laterality Date  . ABDOMINAL HYSTERECTOMY  FEB 2002   TAH/BSO for irregular bleeding  and leiomyoma  . AV FISTULA PLACEMENT Left 07/04/2015   Procedure: ARTERIOVENOUS (AV) FISTULA CREATION-LEFT;  Surgeon: Mal Misty, MD;  Location: [facility];  Service: Vascular;  Laterality: Left;  . CARPAL TUNNEL RELEASE     X 2  . CESAREAN SECTION     X 2  . EYE SURGERY Bilateral    Retina reattachment  . FISTULA SUPERFICIALIZATION Left 08/28/2015   Procedure: BRACHIOCEPHALIC FISTULA SUPERFICIALIZATION;  Surgeon: Mal Misty, MD;  Location: [facility];  Service: Vascular;  Laterality: Left;  . OOPHORECTOMY     BSO  . PELVIC LAPAROSCOPY     Social History   Occupational History  . Not on file.   Social History Main Topics  . Smoking status: Never Smoker  . Smokeless tobacco: Never Used  . Alcohol use 0.0 oz/week     Comment: occasional  . Drug use: No  . Sexual activity: Not on file     Comment: HYST-1st intercourse 46 yo-Fewer than 5 partners

## 2016-05-31 DIAGNOSIS — H2512 Age-related nuclear cataract, left eye: Secondary | ICD-10-CM | POA: Diagnosis not present

## 2016-05-31 DIAGNOSIS — H268 Other specified cataract: Secondary | ICD-10-CM | POA: Diagnosis not present

## 2016-06-07 ENCOUNTER — Ambulatory Visit (INDEPENDENT_AMBULATORY_CARE_PROVIDER_SITE_OTHER): Payer: Medicaid Other | Admitting: Neurology

## 2016-06-07 ENCOUNTER — Encounter: Payer: Self-pay | Admitting: Neurology

## 2016-06-07 VITALS — BP 133/86 | HR 106 | Resp 20 | Ht 63.0 in | Wt 219.0 lb

## 2016-06-07 DIAGNOSIS — R519 Headache, unspecified: Secondary | ICD-10-CM

## 2016-06-07 DIAGNOSIS — G4733 Obstructive sleep apnea (adult) (pediatric): Secondary | ICD-10-CM | POA: Diagnosis not present

## 2016-06-07 DIAGNOSIS — Z9989 Dependence on other enabling machines and devices: Secondary | ICD-10-CM

## 2016-06-07 DIAGNOSIS — Z6841 Body Mass Index (BMI) 40.0 and over, adult: Secondary | ICD-10-CM | POA: Diagnosis not present

## 2016-06-07 DIAGNOSIS — N184 Chronic kidney disease, stage 4 (severe): Secondary | ICD-10-CM | POA: Diagnosis not present

## 2016-06-07 DIAGNOSIS — R51 Headache: Secondary | ICD-10-CM

## 2016-06-07 DIAGNOSIS — E1122 Type 2 diabetes mellitus with diabetic chronic kidney disease: Secondary | ICD-10-CM | POA: Diagnosis not present

## 2016-06-07 DIAGNOSIS — G4719 Other hypersomnia: Secondary | ICD-10-CM

## 2016-06-07 NOTE — Progress Notes (Signed)
SLEEP MEDICINE CLINIC   Provider:  Larey Seat, M D  Referring Provider: Glendale Chard, MD Primary Care Physician:  Maximino Greenland, MD  Chief Complaint  Patient presents with  . Follow-up    cpap not working well    HPI:  Cassandra Campos is a 46 y.o. female , seen here as a referral from Dr. Baird Cancer for a sleep evaluation, the patient was given a prescription for lunesta.    03-2015 Chief complaint according to patient : "Insomnia at night, sleepiness in daytime".  Cassandra Campos reports that she developed insomnia and in response to this excessive daytime sleepiness and fatigue. It is her concern about her work performance but made her bring this to medical attention. 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 She has noted that her productivity has suffered and that she makes more mistakes, she seems to be dozing off. She's not even aware that she is falling asleep but her desk neighbor has notched her , woken her. This has been very embarrassing to her. She only noticed a change from nighttime insomnia to daytime hyper insomnia about a month ago this has been an insidious onset and  fairly recent. Her husband has witnessed her to snore but has not commented about possible apnea.  Sleep habits are as follows: The patient lives with her husband and her daughter in a private home her oldest son has already left on his own. She usually goes to bed around 11 PM, only about 14 days ago as she stopped any kind of lites in her bedroom and of the TV is off. This has not helped her to fall asleep easier however. Her sleep latency is very hard to define for her. She is not aware when she falls asleep when she is awake she feels as if she is just in a drowsy dozing stage without ever entering real restorative, refreshing sleep. She is a very light sleeper and she also has to toss and turn to find a comfortable sleep position. She's not sure how many hours of sleep she truly gets at night.  Her sleep perception is that of very few hours at night perhaps 3 or 4. This is Cassandra Campos that was recently prescribed she has been able to sleep for about 4-5 hours but she still believes her sleep latency to be about 1 or 2 hours. She does not recall her dreams she does not think that she has vivid dreams or nightmares she's not known to ever act out dreams. Nocturia 2 times a night.  She rises about 5:15 AM and  is very drowsy and fatigued. She wakes up with a dry mouth from time to time but she does not have morning headaches. She does not drink any caffeine in any form. She has to commute to work and starts working at 7 AM work through [employer] PM. When she comes home her daughter actually taped her falling asleep at about 4 4:30 PM. Over the last month that has no longer occurred where she was previously sleeping very easily she is now not drifting off in the afternoons. Sleep medical history and family sleep history: The patient has retrognathia, her husband has witnessed her to snore. Social history:  No ETOH, No tobacco use . No caffeine.    Interval history on 06/07/2016 Cassandra Campos underwent a polysomnography on 10/20/2015 and was diagnosed with mild apnea at an AHI of 10.3 but with a strong REM sleep association. During REM  sleep her AHI was 47.2. Equally strong with the association with supine sleep AHI was 10.5 the patient did not retain CO2 but she was hypoxemic with 261.7 minutes of low oxygen in the diagnostic part of the study. Her PLM arousal index was 2.7. The patient was asked to return for CPAP titration which she did on 11/27/2015. Her apnea index was reduced to 3.1 CPAP was explored from 5 through 9 cm water and seemed well tolerated. The patient was ordered the CPAP at 9 cm water with a ResMed air fit P 10. Today I have the first compliance visit with the patient. She states that initially the CPAP worked very well for her and made her more alert. Then it stopped working as well and she  stopped using it. She has had a compliance of only 40% over the last 30 days is used to machine only 12 of those 30 days over 4 hours. Average user time right now is 2 hours and 54 minutes total and on days used average user time was 5 hours and 8 minutes. Set pressure is indeed 9 cm water with 3 cm EPR her residual AHI was 0.5 which is a significant improvement. She did not have major air leaks. I would strongly urge her to continue using CPAP but we will also see if she still is hypoxemic at night and if this may be the reason that she remains fatigued and excessively sleepy. She is no longer working night shifts and therefore does not longer have to sleep in daytime. She is currently unemployed. She endorsed the Epworth sleepiness score at 12 points.   Review of Systems: Out of a complete 14 system review, the patient complains of only the following symptoms, and all other reviewed systems are negative.  Patient has no sleep paralysis, no dream intrusion no hypnagogic or hypnopompic hallucinations and no cataplexy. She suffered from stage III kidney disease, hypertension, diabetes, high cholesterol. She had a surgical history of C-section, carpal tunnel surgery, hysterectomy,  Review of systems is positive for insomnia, daytime sleepiness, snoring, anxiety, the feeling of decreased energy and high fatigue, not enough sleep at night, joint pain feeling hot and cold having swelling in both legs, itching, weight gain. Epworth score 12 , Fatigue severity score last visit was 48 now n/a  , depression score; see below.    Social History   Social History  . Marital status: Married    Spouse name: N/A  . Number of children: N/A  . Years of education: N/A   Occupational History  . Not on file.   Social History Main Topics  . Smoking status: Never Smoker  . Smokeless tobacco: Never Used  . Alcohol use 0.0 oz/week     Comment: occasional  . Drug use: No  . Sexual activity: Not on file      Comment: HYST-1st intercourse 46 yo-Fewer than 5 partners   Other Topics Concern  . Not on file   Social History Narrative  . No narrative on file    Family History  Problem Relation Age of Onset  . Diabetes Mother   . Hypertension Mother   . Diabetes Father   . Hypertension Father   . Cancer Father     STOMACH  . Stroke Maternal Grandmother   . Stroke Maternal Grandfather     Past Medical History:  Diagnosis Date  . Anemia   . Chronic kidney disease   . Diabetes mellitus    type 2  .  Diabetic retinopathy ([facility])   . Fatty liver   . Heart rate fast   . High triglycerides   . Hypercholesteremia   . Hypertension   . Neuropathy ([facility])   . Pneumonia   . Sleep apnea    has not started using Cpap  . Stage 4 chronic kidney disease ([facility])   . Umbilical hernia     Past Surgical History:  Procedure Laterality Date  . ABDOMINAL HYSTERECTOMY  FEB 2002   TAH/BSO for irregular bleeding and leiomyoma  . AV FISTULA PLACEMENT Left 07/04/2015   Procedure: ARTERIOVENOUS (AV) FISTULA CREATION-LEFT;  Surgeon: Mal Misty, MD;  Location: [facility];  Service: Vascular;  Laterality: Left;  . CARPAL TUNNEL RELEASE     X 2  . CESAREAN SECTION     X 2  . EYE SURGERY Bilateral    Retina reattachment  . FISTULA SUPERFICIALIZATION Left 08/28/2015   Procedure: BRACHIOCEPHALIC FISTULA SUPERFICIALIZATION;  Surgeon: Mal Misty, MD;  Location: [facility];  Service: Vascular;  Laterality: Left;  . OOPHORECTOMY     BSO  . PELVIC LAPAROSCOPY      Current Outpatient Prescriptions  Medication Sig Dispense Refill  . allopurinol (ZYLOPRIM) 300 MG tablet Take 300 mg by mouth daily.  5  . amLODipine (NORVASC) 10 MG tablet Take 5 mg by mouth daily.   5  . Cholecalciferol (VITAMIN D3) 5000 UNITS CAPS Take 5,000 Units by mouth daily.     . colchicine 0.6 MG tablet Take 1 tablet (0.6 mg total) by mouth 2 (two) times daily. Take BID for acute gout attack, stop when acute pain resolves 60 tablet 3  .  furosemide (LASIX) 80 MG tablet Take 80 mg by mouth 2 (two) times daily.    Marland Kitchen gabapentin (NEURONTIN) 300 MG capsule Take 300 mg by mouth at bedtime.     . insulin aspart (NOVOLOG) 100 UNIT/ML injection Inject 30-40 units 3 times a day (Patient taking differently: Inject 30-40 Units into the skin 3 (three) times daily with meals. Inject 30-40 units 3 times a day) 4 vial 3  . insulin detemir (LEVEMIR) 100 UNIT/ML injection Inject 62 Units into the skin 2 (two) times daily.    . Insulin Pen Needle 32G X 4 MM MISC Inject 1 each into the skin 5 (five) times daily. 150 each 5  . IRON PO Take by mouth.    Marland Kitchen MINOCYCLINE HCL PO Take by mouth.    Glory Rosebush DELICA LANCETS 74Q MISC     . potassium chloride (MICRO-K) 10 MEQ CR capsule Take 30 mEq by mouth 2 (two) times daily.   5  . pravastatin (PRAVACHOL) 40 MG tablet Take 40 mg by mouth daily.     No current facility-administered medications for this visit.     Allergies as of 06/07/2016  . (No Known Allergies)    Vitals: BP 133/86   Pulse (!) 106   Resp 20   Ht 5\' 3"  (1.6 m)   Wt 219 lb (99.3 kg)   BMI 38.79 kg/m  Last Weight:  Wt Readings from Last 1 Encounters:  06/07/16 219 lb (99.3 kg)   VZD:GLOV mass index is 38.79 kg/m.     Last Height:   Ht Readings from Last 1 Encounters:  06/07/16 5\' 3"  (1.6 m)    Physical exam:  General: The patient is awake, alert and appears not in acute distress. The patient is well groomed. Head: Normocephalic, atraumatic. Neck is supple. Mallampati 4 ,  neck  circumference:17 . Nasal airflow unrestricted  TMJ not  evident . Retrognathia is seen.  Cardiovascular:  Regular rate and rhythm , without  murmurs or carotid bruit, and without distended neck veins. Respiratory: Lungs are clear to auscultation. Skin: Ankle edema , no rash Trunk: BMI is elevated  The patient's posture is erect.   Neurologic exam : The patient is awake and alert, oriented to place and time.   Memory subjective described as  intact.  Attention span & concentration ability appears normal.  Speech is fluent,  without  dysarthria, dysphonia or aphasia.  Mood and affect are appropriate.  Cranial nerves: Pupils are equal and briskly reactive to light.  Extraocular movements  in vertical and horizontal planes intact and without nystagmus. Visual fields by finger perimetry are intact. Hearing to finger rub intact.  Facial sensation intact to fine touch. Facial motor strength is symmetric and tongue and uvula move midline. Shoulder shrug was symmetrical.   Motor exam:  Normal tone, muscle bulk and symmetric strength in all extremities. The patient's grip strength is a little lower than expected or weaker. She reports that she sometimes has trouble opening jars etc.  The patient was advised of the nature of the diagnosed sleep disorder- mild OSA-  , the treatment options and risks for general a health and wellness arising from not treating the condition.  I spent more than 25 minutes RV time and  face to face time with the patient. Greater than 50% of time was spent in counseling and coordination of care. We have discussed the diagnosis and differential and I answered the patient's questions.  The co-morbidities as listed under ROS and PMhx are significant contributors to her OSA risk and are  fatigue inducing. She takes a Vit D supplement. She is unsure that TSH was recently checked.  She has a fistula in place , and has awaited it's maturity for [facility]- dialysis. That she still excessively daytime sleepy and fatigued, but I will urge her to use CPAP nightly at least 4 hours at night. In addition I will order an overnight pulse oximetry for the patient was a finger clip that can be used at home while on CPAP. My goal is is to establish if she has hypoxemia on CPAP, which would require additional oxygen to be ordered. I can also imagine that the fatigue is contributed to by her late-stage kidney disease.    Assessment:  After  physical and neurologic examination, review of laboratory studies,  Personal review of imaging studies, reports of other /same  Imaging studies ,neurophysiology testing and pre-existing records as far as provided in visit., my assessment is   1) the patient has a very high-grade Mallampati and a larger neck circumference at 18 inches, her BMI has reached the morbidly obese category. We discussed a low carb diet, but her nephrologist should address this.  The excessive daytime sleepiness that she reported is very concerning she is dozing off without being aware which could affect her ability to operate machinery and certainly to operate a car. )The patient does not regularly wake up this morning headaches but she is excessively fatigued perhaps more than actually sleepy. Her fatigue severity score was endorsed at 54 points with a very high her Epworth sleepiness score at 10 points. Hypoxemia check.    Plan:  Treatment plan and additional workup :   ONO on CPAP .  Return to CPAP compliance.  Keep a sleep and headache diary.  RV with NP in 2  month.   Asencion Partridge Robbi Spells MD  06/07/2016   CC: Glendale Chard, [place] [address] [place], [place] [id number]

## 2016-06-17 DIAGNOSIS — G4733 Obstructive sleep apnea (adult) (pediatric): Secondary | ICD-10-CM | POA: Diagnosis not present

## 2016-06-18 ENCOUNTER — Other Ambulatory Visit: Payer: Self-pay | Admitting: Internal Medicine

## 2016-06-18 DIAGNOSIS — R945 Abnormal results of liver function studies: Secondary | ICD-10-CM

## 2016-06-25 ENCOUNTER — Ambulatory Visit
Admission: RE | Admit: 2016-06-25 | Discharge: 2016-06-25 | Disposition: A | Discharge status: Home / Self Care | Payer: Medicaid Other | Source: Ambulatory Visit | Attending: Internal Medicine | Admitting: Internal Medicine

## 2016-06-25 DIAGNOSIS — R945 Abnormal results of liver function studies: Secondary | ICD-10-CM

## 2016-07-01 ENCOUNTER — Other Ambulatory Visit: Payer: Medicaid Other

## 2016-07-06 ENCOUNTER — Ambulatory Visit
Admission: RE | Admit: 2016-07-06 | Discharge: 2016-07-06 | Disposition: A | Discharge status: Home / Self Care | Payer: Medicaid Other | Source: Ambulatory Visit | Attending: Internal Medicine | Admitting: Internal Medicine

## 2016-07-08 ENCOUNTER — Telehealth: Payer: Self-pay | Admitting: Family Medicine

## 2016-07-08 NOTE — Telephone Encounter (Signed)
I called pt. [facility] has not called her about her ONO yet. I will resend that order. Pt rescheduled for 07/27/2016 at 10:00am with Dr. Brett Fairy. Pt verbalized understanding.

## 2016-07-08 NOTE — Telephone Encounter (Signed)
This patient is questioning if she needs to come in for appointment on Monday 07/12/16.  She said that on her visit 06/07/16 Dr. Brett Fairy told her that she was ordering another test and would discuss results on her visit 07/12/16.  Patient states no test have been done. Please call patient

## 2016-07-12 ENCOUNTER — Telehealth: Payer: Self-pay | Admitting: Neurology

## 2016-07-12 ENCOUNTER — Ambulatory Visit: Payer: Medicaid Other | Admitting: Adult Health

## 2016-07-12 NOTE — Telephone Encounter (Signed)
Pt request RN to call appt on 3.13.

## 2016-07-14 NOTE — Telephone Encounter (Signed)
[facility] emailed me back...  Cassandra Campos,  I just checked with Lattie Haw on this and she assured me that she is contacting her today to schedule delivery.  Let me know if there is anything else I can do.

## 2016-07-14 NOTE — Telephone Encounter (Signed)
I spoke to patient and let her know that [facility] will call her today. I asked patient to call back next week if she has not received a call. I also was able to move her appt to a more convenient time for her.

## 2016-07-14 NOTE — Telephone Encounter (Signed)
I spoke to patient she would like a different appt time, needs to be in the afternoon. Also ONO has not been scheduled, I will check on it with [facility].

## 2016-07-27 ENCOUNTER — Ambulatory Visit: Payer: Self-pay | Admitting: Neurology

## 2016-08-02 ENCOUNTER — Ambulatory Visit (INDEPENDENT_AMBULATORY_CARE_PROVIDER_SITE_OTHER): Payer: Medicaid Other | Admitting: Neurology

## 2016-08-02 ENCOUNTER — Encounter: Payer: Self-pay | Admitting: Neurology

## 2016-08-02 VITALS — BP 164/97 | HR 107 | Resp 20 | Ht 62.5 in | Wt 228.0 lb

## 2016-08-02 DIAGNOSIS — G4719 Other hypersomnia: Secondary | ICD-10-CM | POA: Diagnosis not present

## 2016-08-02 DIAGNOSIS — Z9114 Patient's other noncompliance with medication regimen: Secondary | ICD-10-CM | POA: Diagnosis not present

## 2016-08-02 DIAGNOSIS — E669 Obesity, unspecified: Secondary | ICD-10-CM

## 2016-08-02 DIAGNOSIS — I150 Renovascular hypertension: Secondary | ICD-10-CM

## 2016-08-02 MED ORDER — BUPROPION HCL ER (XL) 150 MG PO TB24: 150.0000 mg | ORAL_TABLET | Freq: Every day | ORAL | 5 refills | Status: DC

## 2016-08-02 NOTE — Addendum Note (Signed)
Addended by: Larey Seat on: 08/02/2016 04:37 PM   Modules accepted: Orders

## 2016-08-02 NOTE — Progress Notes (Signed)
SLEEP MED[facility]INE CLINIC   Provider:  Larey Seat, M D  Referring Provider: Glendale Chard, MD Primary Care Physician:  Maximino Greenland, MD  Chief Complaint  Patient presents with  . Follow-up    ONO results    HPI:  Cassandra Campos is a 46 y.o. female , seen here as a referral from Dr. Baird Cancer for a sleep evaluation, the patient was given a prescription for lunesta.    03-2015 Chief complaint according to patient : "Insomnia at night, sleepiness in daytime".  Cassandra Campos reports that she developed insomnia and in response to this excessive daytime sleepiness and fatigue. It is her concern about her work performance but made her bring this to medical attention. 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 She has noted that her productivity has suffered and that she makes more mistakes, she seems to be dozing off. She's not even aware that she is falling asleep but her desk neighbor has notched her , woken her. This has been very embarrassing to her. She only noticed a change from nighttime insomnia to daytime hyper insomnia about a month ago this has been an insidious onset and  fairly recent. Her husband has witnessed her to snore but has not commented about possible apnea.  Sleep habits are as follows: The patient lives with her husband and her daughter in a private home her oldest son has already left on his own. She usually goes to bed around 11 PM, only about 14 days ago as she stopped any kind of lites in her bedroom and of the TV is off. This has not helped her to fall asleep easier however. Her sleep latency is very hard to define for her. She is not aware when she falls asleep when she is awake she feels as if she is just in a drowsy dozing stage without ever entering real restorative, refreshing sleep. She is a very light sleeper and she also has to toss and turn to find a comfortable sleep position. She's not sure how many hours of sleep she truly gets at night. Her sleep  perception is that of very few hours at night perhaps 3 or 4. This is [name] that was recently prescribed she has been able to sleep for about 4-5 hours but she still believes her sleep latency to be about 1 or 2 hours. She does not recall her dreams she does not think that she has vivid dreams or nightmares she's not known to ever act out dreams. Nocturia 2 times a night.  She rises about 5:15 AM and  is very drowsy and fatigued. She wakes up with a dry mouth from time to time but she does not have morning headaches. She does not drink any caffeine in any form. She has to commute to work and starts working at 7 AM work through [employer] PM. When she comes home her daughter actually taped her falling asleep at about 4 4:30 PM. Over the last month that has no longer occurred where she was previously sleeping very easily she is now not drifting off in the afternoons. Sleep medical history and family sleep history: The patient has retrognathia, her husband has witnessed her to snore. Social history:  No ETOH, No tobacco use . No caffeine.    Interval history on 06/07/2016 Cassandra Campos underwent a polysomnography on 10/20/2015 and was diagnosed with mild apnea at an AHI of 10.3 but with a strong REM sleep association. During REM sleep her  AHI was 47.2. Equally strong with the association with supine sleep AHI was 10.5 the patient did not retain CO2 but she was hypoxemic with 261.7 minutes of low oxygen in the diagnostic part of the study. Her PLM arousal index was 2.7. The patient was asked to return for CPAP titration which she did on 11/27/2015. Her apnea index was reduced to 3.1 CPAP was explored from 5 through 9 cm water and seemed well tolerated. The patient was ordered the CPAP at 9 cm water with a ResMed air fit P 10. Today I have the first compliance visit with the patient. She states that initially the CPAP worked very well for her and made her more alert. Then it stopped working as well and she stopped  using it. She has had a compliance of only 40% over the last 30 days is used to machine only 12 of those 30 days over 4 hours. Average user time right now is 2 hours and 54 minutes total and on days used average user time was 5 hours and 8 minutes. Set pressure is indeed 9 cm water with 3 cm EPR her residual AHI was 0.5 which is a significant improvement. She did not have major air leaks. I would strongly urge her to continue using CPAP but we will also see if she still is hypoxemic at night and if this may be the reason that she remains fatigued and excessively sleepy. She is no longer working night shifts and therefore does not longer have to sleep in daytime. She is currently unemployed. She endorsed the Epworth sleepiness score at 12 points.   Interval history from 08/02/2016. Cassandra Campos continues to struggle with CPAP use and her compliance reached only 57% but during the time she used CPAP the residual AHI was reduced to 0.2. CPAP is currently set at 9 cm water pressure with 3 cm expiratory pressure relief. On average use the machine 3 hours and 37 minutes. For the first time did she endorsed the Epworth sleepiness score at 20 points today. She denies sleep paralysis, she reports sometimes vivid dreams, no hypnagogic or hypnopompic hallucinations. No cataplexy.  She appears sluggish and depressed. I will d/c CPAP due to non compliance.  Theoretically able to try Modafinil for patient with OSA and EDS- but  has critical HTN.  Review of Systems: Out of a complete 14 system review, the patient complains of only the following symptoms, and all other reviewed systems are negative.  Patient has no sleep paralysis, no dream intrusion no hypnagogic or hypnopompic hallucinations and no cataplexy. She suffered from stage III kidney disease, hypertension, diabetes, high cholesterol.  ,  EPWORTH  20 ! New record.   Social History   Social History  . Marital status: Married    Spouse name: N/A  . Number  of children: N/A  . Years of education: N/A   Occupational History  . Not on file.   Social History Main Topics  . Smoking status: Never Smoker  . Smokeless tobacco: Never Used  . Alcohol use 0.0 oz/week     Comment: occasional  . Drug use: No  . Sexual activity: Not on file     Comment: HYST-1st intercourse 46 yo-Fewer than 5 partners   Other Topics Concern  . Not on file   Social History Narrative  . No narrative on file    Family History  Problem Relation Age of Onset  . Diabetes Mother   . Hypertension Mother   . Diabetes  Father   . Hypertension Father   . Cancer Father     STOMACH  . Stroke Maternal Grandmother   . Stroke Maternal Grandfather     Past Medical History:  Diagnosis Date  . Anemia   . Chronic kidney disease   . Diabetes mellitus    type 2  . Diabetic retinopathy ([place])   . Fatty liver   . Heart rate fast   . High triglycerides   . Hypercholesteremia   . Hypertension   . Neuropathy ([place])   . Pneumonia   . Sleep apnea    has not started using Cpap  . Stage 4 chronic kidney disease ([place])   . Umbilical hernia     Past Surgical History:  Procedure Laterality Date  . ABDOMINAL HYSTERECTOMY  FEB 2002   TAH/BSO for irregular bleeding and leiomyoma  . AV FISTULA PLACEMENT Left 07/04/2015   Procedure: ARTERIOVENOUS (AV) FISTULA CREATION-LEFT;  Surgeon: Mal Misty, MD;  Location: [facility];  Service: Vascular;  Laterality: Left;  . CARPAL TUNNEL RELEASE     X 2  . CESAREAN SECTION     X 2  . EYE SURGERY Bilateral    Retina reattachment  . FISTULA SUPERFICIALIZATION Left 08/28/2015   Procedure: BRACHIOCEPHALIC FISTULA SUPERFICIALIZATION;  Surgeon: Mal Misty, MD;  Location: [facility];  Service: Vascular;  Laterality: Left;  . OOPHORECTOMY     BSO  . PELVIC LAPAROSCOPY      Current Outpatient Prescriptions  Medication Sig Dispense Refill  . allopurinol (ZYLOPRIM) 300 MG tablet Take 300 mg by mouth daily.  5  . amLODipine (NORVASC) 10 MG  tablet Take 5 mg by mouth daily.   5  . amLODipine (NORVASC) 5 MG tablet Take 5 mg by mouth daily.  5  . Cholecalciferol (VITAMIN D3) 5000 UNITS CAPS Take 5,000 Units by mouth daily.     . colchicine 0.6 MG tablet Take 1 tablet (0.6 mg total) by mouth 2 (two) times daily. Take BID for acute gout attack, stop when acute pain resolves 60 tablet 3  . furosemide (LASIX) 80 MG tablet Take 80 mg by mouth 2 (two) times daily.    . insulin aspart (NOVOLOG) 100 UNIT/ML injection Inject 30-40 units 3 times a day (Patient taking differently: Inject 45 Units into the skin 3 (three) times daily with meals. Inject 30-40 units 3 times a day) 4 vial 3  . insulin detemir (LEVEMIR) 100 UNIT/ML injection Inject 62 Units into the skin 2 (two) times daily.    . Insulin Pen Needle 32G X 4 MM MISC Inject 1 each into the skin 5 (five) times daily. 150 each 5  . IRON PO Take by mouth.    Marland Kitchen LYRICA 75 MG capsule Take 75 mg by mouth 2 (two) times daily.  2  . MINOCYCLINE HCL PO Take 50 mg by mouth 2 (two) times daily.     Glory Rosebush DELICA LANCETS 78H MISC     . potassium chloride (MICRO-K) 10 MEQ CR capsule Take 30 mEq by mouth 3 (three) times daily.   5  . pravastatin (PRAVACHOL) 40 MG tablet Take 40 mg by mouth daily.     No current facility-administered medications for this visit.     Allergies as of 08/02/2016  . (No Known Allergies)    Vitals: BP (!) 164/97   Pulse (!) 107   Resp 20   Ht 5' 2.5" (1.588 m)   Wt 228 lb (103.4 kg)  BMI 41.04 kg/m  Last Weight:  Wt Readings from Last 1 Encounters:  08/02/16 228 lb (103.4 kg)   GYJ:EHUD mass index is 41.04 kg/m.     Last Height:   Ht Readings from Last 1 Encounters:  08/02/16 5' 2.5" (1.588 m)    Physical exam:  General: The patient is awake, alert and appears not in acute distress. The patient is well groomed. Head: Normocephalic, atraumatic. Neck is supple. Mallampati 4 ,  neck circumference:17 . Nasal airflow unrestricted  TMJ not  evident .  Retrognathia is seen.   Cranial nerves: Pupils are equal and briskly reactive to light.  Extraocular movements  in vertical and horizontal planes intact and without nystagmus. Visual fields by finger perimetry are intact.  Facial sensation intact to fine touch. Facial motor strength is symmetric and tongue and uvula move midline. Shoulder shrug was symmetrical.   Motor exam:  Normal tone, muscle bulk and symmetric strength in all extremities. The patient's grip strength is a little lower than expected or weaker. She reports that she sometimes has trouble opening jars etc.  The patient was advised of the nature of the diagnosed sleep disorder- mild OSA-  , the treatment options and risks for general a health and wellness arising from not treating the condition.  I spent more than 25 minutes RV time and  face to face time with the patient. Greater than 50% of time was spent in counseling and coordination of care. We have discussed the diagnosis and differential and I answered the patient's questions.     Given that Cassandra Campos's apnea condition was rather mild I cannot longer think of it as a cause for her daytime sleepiness. We also found clear evidence that  hypoxemia at night not present. She used the CPAP machine sporadically 20 out of 30 days and 17 of those days over 4 hours with an average user time of 3 hours and 37 minutes. Set pressure was 9 cm water the 3 cm EPR and the residual AHI was 0.2. She does not feel a difference in the morning after using CPAP versus those nights when not.  Assessment:   1)Super obesity  We discussed a low carb diet, but her internist and nephrologist should address this.   2Mild OSA - on CPAP .The excessive daytime sleepiness that she reported is very concerning- EPWORTH 20 while apnea is treated,  she is dozing off without being aware which could affect her ability to operate machinery and certainly to operate a car. No evidence of hypoxemia - ONO was  negative.  She did not keep a HA diary as requested.   3) hypersomnia may be caused by CKD ?   Depression ? Not likely related to narcolepsy !     Plan:  Treatment plan and additional workup :   Patient not able to comply with CPAP at satisfactory level.  57% compliance.D/C CPAP.  CKD may cause the fatigue EDS.  Her HTN is uncontrolled. For this reason cannot use modafinil  (BP 164 / 97).  Has been tearful, I  think she is extremely sad.  I prescribed Wellbutrin for energy and depression relief.  RV prn in 6 month- needs to get HTN under control before we can address sleepiness further.      Asencion Partridge Byard Carranza MD  08/02/2016  CC Jamal Maes, MD  CC: Glendale Chard, [place] [address] [place], [place] [address]

## 2016-08-02 NOTE — Patient Instructions (Addendum)
Obesity, Adult Obesity is having too much body fat. If you have a BMI of 30 or more, you are obese. BMI is a number that explains how much body fat you have. Obesity is often caused by taking in (consuming) more calories than your body uses. Obesity can cause serious health problems. Changing your lifestyle can help to treat obesity. Follow these instructions at home: Eating and drinking    Follow advice from your doctor about what to eat and drink. Your doctor may tell you to:  Cut down on (limit) fast foods, sweets, and processed snack foods.  Choose low-fat options. For example, choose low-fat milk instead of whole milk.  Eat 5 or more servings of fruits or vegetables every day.  Eat at home more often. This gives you more control over what you eat.  Choose healthy foods when you eat out.  Learn what a healthy portion size is. A portion size is the amount of a certain food that is healthy for you to eat at one time. This is different for each person.  Keep low-fat snacks available.  Avoid sugary drinks. These include soda, fruit juice, iced tea that is sweetened with sugar, and flavored milk.  Eat a healthy breakfast.  Drink enough water to keep your pee (urine) clear or pale yellow.  Do not go without eating for long periods of time (do not fast).  Do not go on popular or trendy diets (fad diets). Physical Activity   Exercise often, as told by your doctor. Ask your doctor:  What types of exercise are safe for you.  How often you should exercise.  Warm up and stretch before being active.  Do slow stretching after being active (cool down).  Rest between times of being active. Lifestyle   Limit how much time you spend in front of your TV, computer, or video game system (be less sedentary).  Find ways to reward yourself that do not involve food.  Limit alcohol intake to no more than 1 drink a day for nonpregnant women and 2 drinks a day for men. One drink equals 12  oz of beer, 5 oz of wine, or 1 oz of hard liquor. General instructions   Keep a weight loss journal. This can help you keep track of:  The food that you eat.  The exercise that you do.  Take over-the-counter and prescription medicines only as told by your doctor.  Take vitamins and supplements only as told by your doctor.  Think about joining a support group. Your doctor may be able to help with this.  Keep all follow-up visits as told by your doctor. This is important. Contact a doctor if:  You cannot meet your weight loss goal after you have changed your diet and lifestyle for 6 weeks. This information is not intended to replace advice given to you by your health care provider. Make sure you discuss any questions you have with your health care provider. Document Released: 07/26/2011 Document Revised: 10/09/2015 Document Reviewed: 02/19/2015 Elsevier Interactive Patient Education  2017 [employer]. Bupropion extended-release tablets (Depression/Mood Disorders) What is this medicine? BUPROPION (byoo PROE pee on) is used to treat depression. This medicine may be used for other purposes; ask your health care provider or pharmacist if you have questions. COMMON BRAND NAME(S): Aplenzin, Budeprion XL, Forfivo XL, Wellbutrin XL What should I tell my health care provider before I take this medicine? They need to know if you have any of these conditions: -an eating disorder,  such as anorexia or bulimia -bipolar disorder or psychosis -diabetes or high blood sugar, treated with medication -glaucoma -head injury or brain tumor -heart disease, previous heart attack, or irregular heart beat -high blood pressure -kidney or liver disease -seizures (convulsions) -suicidal thoughts or a previous suicide attempt -Tourette's syndrome -weight loss -an unusual or allergic reaction to bupropion, other medicines, foods, dyes, or preservatives -breast-feeding -pregnant or trying to become  pregnant How should I use this medicine? Take this medicine by mouth with a glass of water. Follow the directions on the prescription label. You can take it with or without food. If it upsets your stomach, take it with food. Do not crush, chew, or cut these tablets. This medicine is taken once daily at the same time each day. Do not take your medicine more often than directed. Do not stop taking this medicine suddenly except upon the advice of your doctor. Stopping this medicine too quickly may cause serious side effects or your condition may worsen. A special MedGuide will be given to you by the pharmacist with each prescription and refill. Be sure to read this information carefully each time. Talk to your pediatrician regarding the use of this medicine in children. Special care may be needed. Overdosage: If you think you have taken too much of this medicine contact a poison control center or emergency room at once. NOTE: This medicine is only for you. Do not share this medicine with others. What if I miss a dose? If you miss a dose, skip the missed dose and take your next tablet at the regular time. Do not take double or extra doses. What may interact with this medicine? Do not take this medicine with any of the following medications: -linezolid -MAOIs like Azilect, Carbex, Eldepryl, Marplan, Nardil, and Parnate -methylene blue (injected into a vein) -other medicines that contain bupropion like Zyban This medicine may also interact with the following medications: -alcohol -certain medicines for anxiety or sleep -certain medicines for blood pressure like metoprolol, propranolol -certain medicines for depression or psychotic disturbances -certain medicines for HIV or AIDS like efavirenz, lopinavir, nelfinavir, ritonavir -certain medicines for irregular heart beat like propafenone, flecainide -certain medicines for Parkinson's disease like amantadine, levodopa -certain medicines for seizures  like carbamazepine, phenytoin, phenobarbital -cimetidine -clopidogrel -cyclophosphamide -digoxin -furazolidone -isoniazid -nicotine -orphenadrine -procarbazine -steroid medicines like prednisone or cortisone -stimulant medicines for attention disorders, weight loss, or to stay awake -tamoxifen -theophylline -thiotepa -ticlopidine -tramadol -warfarin This list may not describe all possible interactions. Give your health care provider a list of all the medicines, herbs, non-prescription drugs, or dietary supplements you use. Also tell them if you smoke, drink alcohol, or use illegal drugs. Some items may interact with your medicine. What should I watch for while using this medicine? Tell your doctor if your symptoms do not get better or if they get worse. Visit your doctor or health care professional for regular checks on your progress. Because it may take several weeks to see the full effects of this medicine, it is important to continue your treatment as prescribed by your doctor. Patients and their families should watch out for new or worsening thoughts of suicide or depression. Also watch out for sudden changes in feelings such as feeling anxious, agitated, panicky, irritable, hostile, aggressive, impulsive, severely restless, overly excited and hyperactive, or not being able to sleep. If this happens, especially at the beginning of treatment or after a change in dose, call your health care professional. Avoid alcoholic drinks while taking  this medicine. Drinking large amounts of alcoholic beverages, using sleeping or anxiety medicines, or quickly stopping the use of these agents while taking this medicine may increase your risk for a seizure. Do not drive or use heavy machinery until you know how this medicine affects you. This medicine can impair your ability to perform these tasks. Do not take this medicine close to bedtime. It may prevent you from sleeping. Your mouth may get dry.  Chewing sugarless gum or sucking hard candy, and drinking plenty of water may help. Contact your doctor if the problem does not go away or is severe. The tablet shell for some brands of this medicine does not dissolve. This is normal. The tablet shell may appear whole in the stool. This is not a cause for concern. What side effects may I notice from receiving this medicine? Side effects that you should report to your doctor or health care professional as soon as possible: -allergic reactions like skin rash, itching or hives, swelling of the face, lips, or tongue -breathing problems -changes in vision -confusion -elevated mood, decreased need for sleep, racing thoughts, impulsive behavior -fast or irregular heartbeat -hallucinations, loss of contact with reality -increased blood pressure -redness, blistering, peeling or loosening of the skin, including inside the mouth -seizures -suicidal thoughts or other mood changes -unusually weak or tired -vomiting Side effects that usually do not require medical attention (report to your doctor or health care professional if they continue or are bothersome): -constipation -headache -loss of appetite -nausea -tremors -weight loss This list may not describe all possible side effects. Call your doctor for medical advice about side effects. You may report side effects to FDA at 1-800-FDA-1088. Where should I keep my medicine? Keep out of the reach of children. Store at room temperature between 15 and 30 degrees C (59 and 86 degrees F). Throw away any unused medicine after the expiration date. NOTE: This sheet is a summary. It may not cover all possible information. If you have questions about this medicine, talk to your doctor, pharmacist, or health care provider.  2018 Elsevier/Gold Standard (2015-10-24 13:55:13)

## 2016-08-31 ENCOUNTER — Encounter: Payer: Self-pay | Admitting: Physician Assistant

## 2016-09-10 ENCOUNTER — Encounter: Payer: Self-pay | Admitting: Physician Assistant

## 2016-09-10 ENCOUNTER — Ambulatory Visit (INDEPENDENT_AMBULATORY_CARE_PROVIDER_SITE_OTHER): Payer: Medicaid Other | Admitting: Physician Assistant

## 2016-09-10 ENCOUNTER — Other Ambulatory Visit (INDEPENDENT_AMBULATORY_CARE_PROVIDER_SITE_OTHER): Payer: Medicaid Other

## 2016-09-10 VITALS — BP 114/66 | HR 62 | Ht 62.5 in | Wt 220.0 lb

## 2016-09-10 DIAGNOSIS — R1032 Left lower quadrant pain: Secondary | ICD-10-CM | POA: Diagnosis not present

## 2016-09-10 DIAGNOSIS — R194 Change in bowel habit: Secondary | ICD-10-CM

## 2016-09-10 DIAGNOSIS — R945 Abnormal results of liver function studies: Principal | ICD-10-CM

## 2016-09-10 DIAGNOSIS — R7989 Other specified abnormal findings of blood chemistry: Secondary | ICD-10-CM

## 2016-09-10 DIAGNOSIS — K760 Fatty (change of) liver, not elsewhere classified: Secondary | ICD-10-CM

## 2016-09-10 DIAGNOSIS — R1031 Right lower quadrant pain: Secondary | ICD-10-CM | POA: Diagnosis not present

## 2016-09-10 LAB — HEPATIC FUNCTION PANEL
ALBUMIN: 4.3 g/dL (ref 3.5–5.2)
ALK PHOS: 81 U/L (ref 39–117)
ALT: 108 U/L — ABNORMAL HIGH (ref 0–35)
AST: 91 U/L — AB (ref 0–37)
BILIRUBIN TOTAL: 0.7 mg/dL (ref 0.2–1.2)
Bilirubin, Direct: 0.2 mg/dL (ref 0.0–0.3)
Total Protein: 8.1 g/dL (ref 6.0–8.3)

## 2016-09-10 NOTE — Progress Notes (Signed)
Chief Complaint: Elevated LFT's, Change in bowel habits, Gas, Abdominal Pain  HPI:  Cassandra Campos is a 46 year old African-American female with a past medical history of chronic kidney disease, diabetes, fatty liver and others listed below, who was referred to me by Dr. Jamal Maes for a complaint of elevated liver function tests, change in bowel habits, gas and abdominal pain .    Per referring physician's notes patient has abnormal transaminases. She has history of a right upper quadrant ultrasound showing fatty liver in the past. It was thought that possibly patient's minocycline which she had been on long-term over the past year for a skin issue was causing this elevation and this medication was stopped 2 weeks ago. Most recent labs were completed 08/24/16 and show a normal CBC, iron studies and a CMP with elevated AST at 124 and ALT at 119. This was otherwise normal other than her creatinine of 3.08. Previous labs in 07/26/16 showed a CMP with elevated AST at 72 and ALT of 98 and otherwise normal liver enzymes.   Today, the patient presents to clinic and tells me that she is primarily sent for her elevated liver function test which have been elevated over the past few months and seem to be increasing per her other doctors. Patient's minocycline was stopped 2 weeks ago as there was a thought that this could be causing this elevation. She has not had a recheck of her enzymes since then. Patient does report a vague history of fatty liver diagnosis via an ultrasound and asked questions about this today.   Main concern is that a couple of months ago she started to develop lower abdominal cramping pain off and on which would result in diarrheal bowel movements. This occurred for about a month and patient was given IB guard which she took, but "it didn't help". Patient was taking maybe 1 pill a day. Patient then had change from diarrhea to constipation and tells me that she could not have a bowel movement  for 2 days. She ended up taking a stool softener and developed a bowel movement and since that time has had a variance in her bowel habits between loose stools, solid stools and constipation throughout the week. Prior to this time the patient would have 1 bowel movement in the morning which was normal for her. She has tried to start a healthier diet noting that she is eating less fried and greasy foods and more grilled or baked foods. She is also eating more fiber initially but thought this may be causing her diarrhea. So she stopped this. Patient does tell me the abdominal pain is relieved after a bowel movement.   Patient denies fever, chills, blood in her stool, melena, weight loss, anorexia, other changes in medications or diet, nausea, vomiting, heartburn, reflux or symptoms that awaken her at night.     Past Medical History:  Diagnosis Date  . Anemia   . Chronic kidney disease   . Diabetes mellitus    type 2  . Diabetic retinopathy ([place])   . Fatty liver   . Heart rate fast   . High triglycerides   . Hypercholesteremia   . Hypertension   . Neuropathy   . Pneumonia   . Sleep apnea    has not started using Cpap  . Stage 4 chronic kidney disease ([place])   . Umbilical hernia     Past Surgical History:  Procedure Laterality Date  . ABDOMINAL HYSTERECTOMY  FEB 2002   TAH/BSO  for irregular bleeding and leiomyoma  . AV FISTULA PLACEMENT Left 07/04/2015   Procedure: ARTERIOVENOUS (AV) FISTULA CREATION-LEFT;  Surgeon: Mal Misty, MD;  Location: [facility];  Service: Vascular;  Laterality: Left;  . CARPAL TUNNEL RELEASE     X 2  . CESAREAN SECTION     X 2  . EYE SURGERY Bilateral    Retina reattachment  . FISTULA SUPERFICIALIZATION Left 08/28/2015   Procedure: BRACHIOCEPHALIC FISTULA SUPERFICIALIZATION;  Surgeon: Mal Misty, MD;  Location: [facility];  Service: Vascular;  Laterality: Left;  . OOPHORECTOMY     BSO  . PELVIC LAPAROSCOPY      Current Outpatient Prescriptions  Medication  Sig Dispense Refill  . allopurinol (ZYLOPRIM) 300 MG tablet Take 300 mg by mouth daily.  5  . amLODipine (NORVASC) 10 MG tablet Take 5 mg by mouth daily.   5  . amLODipine (NORVASC) 5 MG tablet Take 5 mg by mouth daily.  5  . buPROPion (WELLBUTRIN XL) 150 MG 24 hr tablet Take 1 tablet (150 mg total) by mouth daily. 30 tablet 5  . Cholecalciferol (VITAMIN D3) 5000 UNITS CAPS Take 5,000 Units by mouth daily.     . colchicine 0.6 MG tablet Take 1 tablet (0.6 mg total) by mouth 2 (two) times daily. Take BID for acute gout attack, stop when acute pain resolves 60 tablet 3  . furosemide (LASIX) 80 MG tablet Take 80 mg by mouth 2 (two) times daily.    . insulin aspart (NOVOLOG) 100 UNIT/ML injection Inject 30-40 units 3 times a day (Patient taking differently: Inject 45 Units into the skin 3 (three) times daily with meals. Inject 30-40 units 3 times a day) 4 vial 3  . insulin detemir (LEVEMIR) 100 UNIT/ML injection Inject 62 Units into the skin 2 (two) times daily.    . Insulin Pen Needle 32G X 4 MM MISC Inject 1 each into the skin 5 (five) times daily. 150 each 5  . IRON PO Take by mouth.    Marland Kitchen LYRICA 75 MG capsule Take 75 mg by mouth 2 (two) times daily.  2  . ONETOUCH DELICA LANCETS 95G MISC     . potassium chloride (MICRO-K) 10 MEQ CR capsule Take 30 mEq by mouth 3 (three) times daily.   5  . pravastatin (PRAVACHOL) 40 MG tablet Take 40 mg by mouth daily.     No current facility-administered medications for this visit.     Allergies as of 09/10/2016  . (No Known Allergies)    Family History  Problem Relation Age of Onset  . Diabetes Mother   . Hypertension Mother   . Diabetes Father   . Hypertension Father   . Cancer Father     STOMACH  . Stroke Maternal Grandmother   . Stroke Maternal Grandfather     Social History   Social History  . Marital status: Married    Spouse name: N/A  . Number of children: 2  . Years of education: N/A   Occupational History  . unemployed     Social History Main Topics  . Smoking status: Never Smoker  . Smokeless tobacco: Never Used  . Alcohol use 0.0 oz/week     Comment: occasional  . Drug use: No  . Sexual activity: Not on file     Comment: HYST-1st intercourse 46 yo-Fewer than 5 partners   Other Topics Concern  . Not on file   Social History Narrative  . No narrative on file  Review of Systems:    Constitutional: Positive for fatigue Skin: Positive for itching and skin rash Cardiovascular: No chest pain Respiratory: Positive for shortness of breath Gastrointestinal: See HPI and otherwise negative Genitourinary: Positive for urine leakage Neurological: No headache, dizziness or syncope Musculoskeletal: Positive for back pain and muscle cramps as well as swelling of feet/legs Hematologic: No bleeding or bruising Psychiatric: Positive history of anxiety   Physical Exam:  Vital signs: BP 114/66   Pulse 62   Ht 5' 2.5" (1.588 m)   Wt 220 lb (99.8 kg)   BMI 39.60 kg/m   Constitutional:   Pleasant Obese African-American female appears to be in NAD, Well developed, Well nourished, alert and cooperative Head:  Normocephalic and atraumatic. Eyes:   PEERL, EOMI. No icterus. Conjunctiva pink. Ears:  Normal auditory acuity. Neck:  Supple Throat: Oral cavity and pharynx without inflammation, swelling or lesion.  Respiratory: Respirations even and unlabored. Lungs clear to auscultation bilaterally.   No wheezes, crackles, or rhonchi.  Cardiovascular: Normal S1, S2. No MRG. Regular rate and rhythm. No peripheral edema, cyanosis or pallor.  Gastrointestinal:  Soft, nondistended, nontender. No rebound or guarding. Normal bowel sounds. No appreciable masses or hepatomegaly. Rectal:  Not performed.  Msk:  Symmetrical without gross deformities. Without edema, no deformity or joint abnormality.  Neurologic:  Alert and  oriented x4;  grossly normal neurologically.  Skin:   Dry and intact without significant lesions or  rashes. Psychiatric: Demonstrates good judgement and reason without abnormal affect or behaviors.  See HPI for recent labs.  US ABDOMEN LIMITED - RIGHT UPPER QUADRANT 07/06/16  COMPARISON:  Ultrasound the abdomen of 04/30/2015  FINDINGS: Gallbladder:  The gallbladder is visualized and no gallstones are noted. There is no pain over the gallbladder with compression.  Common bile duct:  Diameter: The common bile duct is normal measuring 5.4 mm in diameter.  Liver:  The liver parenchyma is echogenic and inhomogeneous consistent with diffuse fatty infiltration. No focal hepatic abnormality is noted.  IMPRESSION: 1. No gallstones. 2. Fatty infiltration of the liver.  No focal hepatic abnormality.   Electronically Signed   By: Ivar Drape M.D.   On: 07/06/2016 09:48  Assessment: 1. Elevated liver function tests: Known fatty liver,  liver enzymes have been increasing recently, question if this could be related to minocycline, this has been stopped. Will recheck LFTs, could also consider autoimmune causes versus other 2. Lower abdominal cramping: With bowel movements below, relieved afterwards, consider IBS 3. Change in bowel habits: Radiating from diarrhea to constipation every few days, consider relation to new diet versus IBS versus medicines versus other 4. Fatty liver: See ultrasound as above  Plan: 1. Discussed with the patient that since we have stopped minocycline over the past 2-3 weeks, we will recheck her LFTs today and see if we are trending back down, if not would recommend further liver serologies to consider autoimmune causes versus other. She agrees with this plan. 2. Would recommend patient start IB guard 2 tabs twice daily, provided patient with samples and coupon 3. Patient to start a fiber supplement, discussed that she should have at least 25-35 g per day with the use of a fiber supplement and through her diet. Did discuss that she should slowly  increase fiber as this can increase gas in her system. 4. Discussed the variance in the patient's stools, this could be related to various medication she is taking and/or IBS symptoms, this could also be related to her new diet,  recommend that she wait another 3-4 weeks to see how this goes. 5. Discussed a slow and steady weight loss of 1-2 pounds per week for fatty liver. She should avoid hepatotoxic substances including alcohol. 6. Patient to follow in clinic in 2-3 months or sooner if necessary, if liver enzymes are elevated will order further labs before then. Patient was assigned to Dr. Henrene Pastor as he is the supervising physician this afternoon.  Ellouise Newer, PA-C [facility] 09/10/2016, 4:00 PM  Cc: Glendale Chard, MD

## 2016-09-10 NOTE — Progress Notes (Signed)
Cassandra Campos, good workup. Have her follow up with you. Thanks

## 2016-09-10 NOTE — Patient Instructions (Signed)
IB gard 2 tabs twice a day.   We have given you a high fiber diet handout. Please strive to have 25-30 grams of fiber daily.   Your provider suggest that you drink more water. Try to have at least 6-8 8 oz glasses of water daily.

## 2016-09-16 ENCOUNTER — Ambulatory Visit (INDEPENDENT_AMBULATORY_CARE_PROVIDER_SITE_OTHER): Payer: Medicaid Other | Admitting: Neurology

## 2016-09-16 ENCOUNTER — Encounter: Payer: Self-pay | Admitting: Neurology

## 2016-09-16 VITALS — BP 157/89 | HR 117 | Ht 63.0 in | Wt 220.0 lb

## 2016-09-16 DIAGNOSIS — I129 Hypertensive chronic kidney disease with stage 1 through stage 4 chronic kidney disease, or unspecified chronic kidney disease: Secondary | ICD-10-CM

## 2016-09-16 DIAGNOSIS — G4719 Other hypersomnia: Secondary | ICD-10-CM | POA: Diagnosis not present

## 2016-09-16 DIAGNOSIS — E1322 Other specified diabetes mellitus with diabetic chronic kidney disease: Secondary | ICD-10-CM | POA: Diagnosis not present

## 2016-09-16 DIAGNOSIS — N184 Chronic kidney disease, stage 4 (severe): Secondary | ICD-10-CM

## 2016-09-16 DIAGNOSIS — E669 Obesity, unspecified: Secondary | ICD-10-CM | POA: Diagnosis not present

## 2016-09-16 DIAGNOSIS — I10 Essential (primary) hypertension: Secondary | ICD-10-CM | POA: Diagnosis not present

## 2016-09-16 DIAGNOSIS — F5101 Primary insomnia: Secondary | ICD-10-CM

## 2016-09-16 MED ORDER — MODAFINIL 200 MG PO TABS: ORAL_TABLET | ORAL | 1 refills | Status: DC

## 2016-09-16 NOTE — Patient Instructions (Signed)

## 2016-09-16 NOTE — Progress Notes (Signed)
SLEEP [facility]INE CL[facility]IC   Provider:  Larey Seat, M D  Referring Provider: Glendale Chard, MD Primary Care Physician:  Maximino Greenland, MD  Chief Complaint  Patient presents with  . Follow-up    pt denies referral for dizziness, pt says wellbutrin is not working and she is not sleeping again    HPI:  Nessa Ramaker is a 46 y.o. female , seen here as a referral from Dr. Baird Cancer for insomnia, jitters and poor balance, re referral  I have the pleasure of seeing Mrs. Forgey today for a different medical issue, we have met in March of this year and discontinued CPAP therapy due to poor compliance. Dr. Baird Cancer now would like me to evaluate the patient for dizziness and diabetic neuropathy, but the dizziness has resolved for now the patient states that she loses her balance a lot that she feels unsteady that this is one of her main complaints. In addition I would like to add that she still excessively daytime sleepy and endorsed the Epworth score at 18 points now being of CPAP the fatigue severity score rose to 54 points. She does have comorbidities of insulin-dependent diabetes, hypertension, superobesity, and in the past had reported sciatica, proliferative diabetic neuropathy, retinopathy, and headaches. I had prescribed Wellbutrin as a stimulant for the patient also appeared very depressed and her last visit. She still reports a lot of crying spells, and she has not gained energy. This is her most disabling condition excessive daytime sleepiness and fatigue. Patient does have a BP machine at home,shall use for medication side effects.  We had last visit also spoken about the contributory factors of CAD, diabetes, and hypertension if not well controlled. She remains with high blood pressure is a very high heart rate which was also seen in her sleep study.  Mrs. Serena reports that her balance problems a manifesting as drifting or stumbling to one side or the other there is not a preferred week  side and she does not limp. She has tripped over objects a lot she stumbles over her own feet she states. She does not report that her legs are weak but so experiences knee pain and locking her knee gives way which could be one part of her balance problems.     03-2015 Chief complaint according to patient : "Insomnia at night, sleepiness in daytime".  Mrs. Bamba reports that she developed insomnia and in response to this developed excessive daytime sleepiness and fatigue. It is her concern about her work performance but made her bring this to medical attention. 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 She has noted that her productivity has suffered and that she makes more mistakes, she seems to be dozing off. She's not even aware that she is falling asleep but her desk neighbor has notched her , woken her. This has been very embarrassing to her.She only noticed a change from nighttime insomnia to daytime hyper insomnia about a month ago this has been an insidious onset and  fairly recent. Her husband has witnessed her to snore but has not commented about possible apnea. Sleep habits are as follows: The patient lives with her husband and her daughter in a private home her oldest son has already left on his own. She usually goes to bed around 11 PM, only about 14 days ago as she stopped any kind of lites in her bedroom and of the TV is off. This has not helped her to fall asleep easier  however. Her sleep latency is very hard to define for her. She is not aware when she falls asleep when she is awake she feels as if she is just in a drowsy dozing stage without ever entering real restorative, refreshing sleep. She is a very light sleeper and she also has to toss and turn to find a comfortable sleep position. She's not sure how many hours of sleep she truly gets at night. Her sleep perception is that of very few hours at night perhaps 3 or 4. This is [name] that was recently prescribed she has been  able to sleep for about 4-5 hours but she still believes her sleep latency to be about 1 or 2 hours. She does not recall her dreams she does not think that she has vivid dreams or nightmares she's not known to ever act out dreams. Nocturia 2 times a night.  She rises about 5:15 AM and  is very drowsy and fatigued. She wakes up with a dry mouth from time to time but she does not have morning headaches. She does not drink any caffeine in any form. She has to commute to work and starts working at 7 AM work through [employer] PM. When she comes home her daughter actually taped her falling asleep at about 4 4:30 PM. Over the last month that has no longer occurred where she was previously sleeping very easily she is now not drifting off in the afternoons. Sleep medical history and family sleep history: The patient has retrognathia, her husband has witnessed her to snore. Social history:  No ETOH, No tobacco use . No caffeine.    Interval history on 06/07/2016 Mrs. Filkins underwent a polysomnography on 10/20/2015 and was diagnosed with mild apnea at an AHI of 10.3 but with a strong REM sleep association. During REM sleep her AHI was 47.2. Equally strong with the association with supine sleep AHI was 10.5 the patient did not retain CO2 but she was hypoxemic with 261.7 minutes of low oxygen in the diagnostic part of the study. Her PLM arousal index was 2.7. The patient was asked to return for CPAP titration which she did on 11/27/2015. Her apnea index was reduced to 3.1 CPAP was explored from 5 through 9 cm water and seemed well tolerated. The patient was ordered the CPAP at 9 cm water with a ResMed air fit P 10. Today I have the first compliance visit with the patient. She states that initially the CPAP worked very well for her and made her more alert. Then it stopped working as well and she stopped using it. She has had a compliance of only 40% over the last 30 days is used to machine only 12 of those 30 days over 4  hours. Average user time right now is 2 hours and 54 minutes total and on days used average user time was 5 hours and 8 minutes. Set pressure is indeed 9 cm water with 3 cm EPR her residual AHI was 0.5 which is a significant improvement. She did not have major air leaks. I would strongly urge her to continue using CPAP but we will also see if she still is hypoxemic at night and if this may be the reason that she remains fatigued and excessively sleepy. She is no longer working night shifts and therefore does not longer have to sleep in daytime. She is currently unemployed. She endorsed the Epworth sleepiness score at 12 points.     Review of Systems: Out of a  complete 14 system review, the patient complains of only the following symptoms, and all other reviewed systems are negative.  Patient has no sleep paralysis, no dream intrusion no hypnagogic or hypnopompic hallucinations and no cataplexy. She suffered from stage III kidney disease, hypertension, diabetes, high cholesterol.  ,  EPWORTH  20 ! New record.   Social History   Social History  . Marital status: Married    Spouse name: N/A  . Number of children: 2  . Years of education: N/A   Occupational History  . unemployed    Social History Main Topics  . Smoking status: Never Smoker  . Smokeless tobacco: Never Used  . Alcohol use 0.0 oz/week     Comment: occasional  . Drug use: No  . Sexual activity: Not on file     Comment: HYST-1st intercourse 46 yo-Fewer than 5 partners   Other Topics Concern  . Not on file   Social History Narrative  . No narrative on file    Family History  Problem Relation Age of Onset  . Diabetes Mother   . Hypertension Mother   . Diabetes Father   . Hypertension Father   . Cancer Father     STOMACH  . Stroke Maternal Grandmother   . Stroke Maternal Grandfather     Past Medical History:  Diagnosis Date  . Anemia   . Chronic kidney disease   . Diabetes mellitus    type 2  . Diabetic  retinopathy ([place])   . Fatty liver   . Heart rate fast   . High triglycerides   . Hypercholesteremia   . Hypertension   . Neuropathy   . Pneumonia   . Sleep apnea    has not started using Cpap  . Stage 4 chronic kidney disease ([place])   . Umbilical hernia     Past Surgical History:  Procedure Laterality Date  . ABDOMINAL HYSTERECTOMY  FEB 2002   TAH/BSO for irregular bleeding and leiomyoma  . AV FISTULA PLACEMENT Left 07/04/2015   Procedure: ARTERIOVENOUS (AV) FISTULA CREATION-LEFT;  Surgeon: Mal Misty, MD;  Location: [facility];  Service: Vascular;  Laterality: Left;  . CARPAL TUNNEL RELEASE     X 2  . CESAREAN SECTION     X 2  . EYE SURGERY Bilateral    Retina reattachment  . FISTULA SUPERFICIALIZATION Left 08/28/2015   Procedure: BRACHIOCEPHALIC FISTULA SUPERFICIALIZATION;  Surgeon: Mal Misty, MD;  Location: [facility];  Service: Vascular;  Laterality: Left;  . OOPHORECTOMY     BSO  . PELVIC LAPAROSCOPY      Current Outpatient Prescriptions  Medication Sig Dispense Refill  . allopurinol (ZYLOPRIM) 300 MG tablet Take 300 mg by mouth daily.  5  . amLODipine (NORVASC) 5 MG tablet Take 5 mg by mouth daily.  5  . buPROPion (WELLBUTRIN XL) 150 MG 24 hr tablet Take 1 tablet (150 mg total) by mouth daily. 30 tablet 5  . Cholecalciferol (VITAMIN D3) 5000 UNITS CAPS Take 5,000 Units by mouth daily.     . colchicine 0.6 MG tablet Take 1 tablet (0.6 mg total) by mouth 2 (two) times daily. Take BID for acute gout attack, stop when acute pain resolves 60 tablet 3  . furosemide (LASIX) 80 MG tablet Take 80 mg by mouth 2 (two) times daily.    . insulin aspart (NOVOLOG) 100 UNIT/ML injection Inject 30-40 units 3 times a day (Patient taking differently: Inject 45 Units into the skin 3 (three) times  daily with meals. Inject 30-40 units 3 times a day) 4 vial 3  . insulin detemir (LEVEMIR) 100 UNIT/ML injection Inject 62 Units into the skin 2 (two) times daily.    . Insulin Pen Needle 32G X 4 MM  MISC Inject 1 each into the skin 5 (five) times daily. 150 each 5  . IRON PO Take 150 mg by mouth daily.     Marland Kitchen LYRICA 75 MG capsule Take 75 mg by mouth daily.   2  . ONETOUCH DELICA LANCETS 77O MISC     . potassium chloride (MICRO-K) 10 MEQ CR capsule Take 30 mEq by mouth 3 (three) times daily.   5  . pravastatin (PRAVACHOL) 40 MG tablet Take 40 mg by mouth daily.     No current facility-administered medications for this visit.     Allergies as of 09/16/2016  . (No Known Allergies)    Vitals: BP (!) 157/89   Pulse (!) 117   Ht '5\' 3"'  (1.6 m)   Wt 220 lb (99.8 kg)   BMI 38.97 kg/m  Last Weight:  Wt Readings from Last 1 Encounters:  09/16/16 220 lb (99.8 kg)   EUM:PNTI mass index is 38.97 kg/m.     Last Height:   Ht Readings from Last 1 Encounters:  09/16/16 '5\' 3"'  (1.6 m)    Physical exam:  General: The patient is awake, alert and appears not in acute distress. The patient is well groomed. Head: Normocephalic, atraumatic. Neck is supple. Mallampati 4 ,  neck circumference:17 . Nasal airflow unrestricted  TMJ not  evident . Retrognathia is seen.   Cranial nerves: Pupils are equal and briskly reactive to light.  Extraocular movements  in vertical and horizontal planes intact and without nystagmus. Visual fields by finger perimetry are intact.  Facial sensation intact to fine touch. Facial motor strength is symmetric and tongue and uvula move midline. Shoulder shrug was symmetrical.  Sensory exam. The patient presents today in sandals. She said that when she has a close to her toes and forefoot get numb tingly and irritated.  She does not report a loss of complete numbness. She also still has deep tendon reflexes, and a downgoing Babinski. Coordination is inatct by finger nose, heel to chin.  Motor exam:  Normal tone, muscle bulk and symmetric strength in all extremities. The patient's grip strength is a little lower than expected or weaker. She reports that she sometimes has  trouble opening jars etc. Mr. Jessie was able to walk down the hall and turned with 4 steps. She does have normal arm swing. She has very flat feet, and her gait is not narrow-base due to obesity.   The patient was advised of the nature of the diagnosed sleep disorder- mild OSA-  , the treatment options and risks for general a health and wellness arising from not treating the condition.  I spent more than 40 minutes RV time and  face to face time with the patient. Greater than 50% of time was spent in counseling and coordination of care. We have discussed the diagnosis and differential and I answered the patient's questions.     cAssessment:     0) balance problem is multifactorial-  Sciatica, diabetic neuropathy with numbness and tingling, no RLS.  Will refer to PT for balance and gait stability evaluation. Order MRI brain, no cervicalgia no radiculopathy in upper extremities. . 1)Super obesity  We discussed a low carb diet, but her internist and nephrologist should address this.  The patient could also be seen by [facility] on [address] in [facility]   2)Mild OSA - not longer on CPAP . She has retrognathia and may be a candidate for a dental device. I offered referral   3)The excessive daytime sleepiness that she reported is very concerning- EPWORTH 20 while apnea is treated,  Now 18 without she is dozing off without being aware which could affect her ability to operate machinery and certainly to operate a car.  No evidence of hypoxemia - ONO was negative.     hypersomnia and excessive fatigue, which continues on wellbutrin. It may be caused by CKD ?  by Depression ? Not likely related to narcolepsy ! Trial of modafinil.  4) patient reported tremors on wellbutrin, now improved.     Plan:  Treatment plan and additional workup :   CKD may cause the fatigue EDS.   HTN .  I will prescribe modafinil  (BP 164 / 97).    Was switched from Wellbutrin to Lyrica  RV  With NP. prn in 6 month- needs  to get HTN under control  before we can address sleepiness further.       Asencion Partridge Prajna Vanderpool MD  09/16/2016  CC Jamal Maes, MD  CC: Glendale Chard, Md [address] [place], [place] [address]

## 2016-11-25 ENCOUNTER — Encounter (INDEPENDENT_AMBULATORY_CARE_PROVIDER_SITE_OTHER): Payer: Medicaid Other | Admitting: Family Medicine

## 2016-11-26 DIAGNOSIS — H43812 Vitreous degeneration, left eye: Secondary | ICD-10-CM | POA: Diagnosis not present

## 2016-11-26 DIAGNOSIS — H338 Other retinal detachments: Secondary | ICD-10-CM | POA: Diagnosis not present

## 2016-11-26 DIAGNOSIS — H31092 Other chorioretinal scars, left eye: Secondary | ICD-10-CM | POA: Diagnosis not present

## 2016-11-26 DIAGNOSIS — E113511 Type 2 diabetes mellitus with proliferative diabetic retinopathy with macular edema, right eye: Secondary | ICD-10-CM | POA: Diagnosis not present

## 2016-12-02 ENCOUNTER — Other Ambulatory Visit: Payer: Self-pay

## 2016-12-02 DIAGNOSIS — N185 Chronic kidney disease, stage 5: Secondary | ICD-10-CM

## 2016-12-08 DIAGNOSIS — N184 Chronic kidney disease, stage 4 (severe): Secondary | ICD-10-CM | POA: Diagnosis not present

## 2016-12-08 DIAGNOSIS — Z794 Long term (current) use of insulin: Secondary | ICD-10-CM | POA: Diagnosis not present

## 2016-12-08 DIAGNOSIS — E1165 Type 2 diabetes mellitus with hyperglycemia: Secondary | ICD-10-CM | POA: Diagnosis not present

## 2016-12-08 DIAGNOSIS — E1140 Type 2 diabetes mellitus with diabetic neuropathy, unspecified: Secondary | ICD-10-CM | POA: Diagnosis not present

## 2016-12-16 ENCOUNTER — Telehealth: Payer: Self-pay

## 2016-12-16 DIAGNOSIS — R7989 Other specified abnormal findings of blood chemistry: Secondary | ICD-10-CM

## 2016-12-16 DIAGNOSIS — R945 Abnormal results of liver function studies: Principal | ICD-10-CM

## 2016-12-16 NOTE — Telephone Encounter (Signed)
-----   Message from Jeoffrey Massed, RN sent at 09/13/2016 10:14 AM EDT ----- recheck of her LFT's in another 3-4 mos

## 2016-12-16 NOTE — Telephone Encounter (Signed)
The pt has been advised to have labs and will also keep upcoming appt with Dr Henrene Pastor

## 2016-12-17 ENCOUNTER — Other Ambulatory Visit: Payer: Self-pay | Admitting: Internal Medicine

## 2016-12-17 DIAGNOSIS — Z1231 Encounter for screening mammogram for malignant neoplasm of breast: Secondary | ICD-10-CM

## 2017-01-11 ENCOUNTER — Other Ambulatory Visit (INDEPENDENT_AMBULATORY_CARE_PROVIDER_SITE_OTHER): Payer: Medicaid Other

## 2017-01-11 DIAGNOSIS — R7989 Other specified abnormal findings of blood chemistry: Secondary | ICD-10-CM

## 2017-01-11 DIAGNOSIS — R945 Abnormal results of liver function studies: Principal | ICD-10-CM

## 2017-01-11 LAB — HEPATIC FUNCTION PANEL
ALT: 71 U/L — ABNORMAL HIGH (ref 0–35)
AST: 58 U/L — ABNORMAL HIGH (ref 0–37)
Albumin: 3.7 g/dL (ref 3.5–5.2)
Alkaline Phosphatase: 69 U/L (ref 39–117)
Bilirubin, Direct: 0.2 mg/dL (ref 0.0–0.3)
Total Bilirubin: 0.5 mg/dL (ref 0.2–1.2)
Total Protein: 7.2 g/dL (ref 6.0–8.3)

## 2017-01-12 ENCOUNTER — Encounter: Payer: Self-pay | Admitting: Vascular Surgery

## 2017-01-13 ENCOUNTER — Telehealth: Payer: Self-pay | Admitting: Internal Medicine

## 2017-01-13 NOTE — Telephone Encounter (Signed)
Labs sent per pt request.

## 2017-01-14 ENCOUNTER — Ambulatory Visit: Payer: Medicaid Other | Admitting: Internal Medicine

## 2017-01-20 ENCOUNTER — Other Ambulatory Visit: Payer: Self-pay | Admitting: Vascular Surgery

## 2017-01-20 ENCOUNTER — Encounter: Payer: Self-pay | Admitting: Vascular Surgery

## 2017-01-20 ENCOUNTER — Ambulatory Visit (HOSPITAL_COMMUNITY)
Admission: RE | Admit: 2017-01-20 | Discharge: 2017-01-20 | Disposition: A | Discharge status: Home / Self Care | Payer: Medicaid Other | Source: Ambulatory Visit | Attending: Vascular Surgery | Admitting: Vascular Surgery

## 2017-01-20 ENCOUNTER — Ambulatory Visit (INDEPENDENT_AMBULATORY_CARE_PROVIDER_SITE_OTHER): Payer: Medicaid Other | Admitting: Vascular Surgery

## 2017-01-20 VITALS — BP 154/98 | HR 91 | Temp 97.1°F | Ht 62.5 in | Wt 228.0 lb

## 2017-01-20 DIAGNOSIS — N185 Chronic kidney disease, stage 5: Secondary | ICD-10-CM | POA: Insufficient documentation

## 2017-01-20 DIAGNOSIS — Z4901 Encounter for fitting and adjustment of extracorporeal dialysis catheter: Secondary | ICD-10-CM | POA: Insufficient documentation

## 2017-01-20 DIAGNOSIS — N184 Chronic kidney disease, stage 4 (severe): Secondary | ICD-10-CM | POA: Diagnosis not present

## 2017-01-20 NOTE — Progress Notes (Signed)
Patient is a 46 year old female referred by Dr. Lorrene Reid for evaluation of a pre-existing left arm AV fistula. The patient was noted to have decreased quality of her bruit by Dr. Lorrene Reid recently. The fistula was placed in April 2017. Patient is currently Ckd4. She denies any numbness or tingling in her hand. She is not currently on hemodialysis.  Review of systems: She denies any skin itching. She denies shortness of breath. She denies chest pain.  Physical exam:  Vitals:   01/20/17 0902  BP: (!) 154/98  Pulse: 91  Temp: (!) 97.1 F (36.2 C)  TempSrc: Oral  SpO2: 98%  Weight: 228 lb (103.4 kg)  Height: 5' 2.5" (1.588 m)    Left upper extremity: Audible bruit palpable thrill in fistula but more difficult to palpate and auscultate any upper arm  Data: Patient had a duplex ultrasound of her AV fistula today. This showed possible narrowing in the distal portion of the venous outflow.  Assessment: Possible narrowing of the outflow of left arm AV fistula. I discussed with the patient today the possibility of a fistulogram possible angioplasty. Also discussed with her that we would have to give her a limited amount of contrast which could potentially cause her to have contrast nephropathy. She was reluctant to proceed before discussing this with Dr. Lorrene Reid. The patient will call us if she wishes to schedule a fistulogram near future.  Plan: See above  Ruta Hinds, MD [facility] Office: [phone] Pager: [phone]

## 2017-01-21 ENCOUNTER — Encounter: Payer: Medicaid Other | Admitting: Gynecology

## 2017-01-24 DIAGNOSIS — E1122 Type 2 diabetes mellitus with diabetic chronic kidney disease: Secondary | ICD-10-CM | POA: Diagnosis not present

## 2017-01-24 DIAGNOSIS — N184 Chronic kidney disease, stage 4 (severe): Secondary | ICD-10-CM | POA: Diagnosis not present

## 2017-01-24 DIAGNOSIS — Z23 Encounter for immunization: Secondary | ICD-10-CM | POA: Diagnosis not present

## 2017-01-24 DIAGNOSIS — N08 Glomerular disorders in diseases classified elsewhere: Secondary | ICD-10-CM | POA: Diagnosis not present

## 2017-01-24 DIAGNOSIS — Z0000 Encounter for general adult medical examination without abnormal findings: Secondary | ICD-10-CM | POA: Diagnosis not present

## 2017-01-26 ENCOUNTER — Other Ambulatory Visit: Payer: Self-pay | Admitting: Internal Medicine

## 2017-01-26 DIAGNOSIS — N63 Unspecified lump in unspecified breast: Secondary | ICD-10-CM

## 2017-02-01 ENCOUNTER — Other Ambulatory Visit: Payer: Medicaid Other

## 2017-02-02 ENCOUNTER — Ambulatory Visit: Payer: Medicaid Other | Admitting: Adult Health

## 2017-02-02 DIAGNOSIS — G4700 Insomnia, unspecified: Secondary | ICD-10-CM | POA: Diagnosis not present

## 2017-02-04 ENCOUNTER — Other Ambulatory Visit: Payer: Self-pay | Admitting: Internal Medicine

## 2017-02-04 ENCOUNTER — Ambulatory Visit
Admission: RE | Admit: 2017-02-04 | Discharge: 2017-02-04 | Disposition: A | Discharge status: Home / Self Care | Payer: Medicaid Other | Source: Ambulatory Visit | Attending: Internal Medicine | Admitting: Internal Medicine

## 2017-02-04 DIAGNOSIS — N63 Unspecified lump in unspecified breast: Secondary | ICD-10-CM

## 2017-02-04 DIAGNOSIS — R599 Enlarged lymph nodes, unspecified: Secondary | ICD-10-CM

## 2017-02-04 DIAGNOSIS — N6310 Unspecified lump in the right breast, unspecified quadrant: Secondary | ICD-10-CM

## 2017-02-07 ENCOUNTER — Telehealth: Payer: Self-pay

## 2017-02-07 ENCOUNTER — Ambulatory Visit: Payer: Medicaid Other | Admitting: Adult Health

## 2017-02-07 DIAGNOSIS — N183 Chronic kidney disease, stage 3 unspecified: Secondary | ICD-10-CM

## 2017-02-07 DIAGNOSIS — T829XXA Unspecified complication of cardiac and vascular prosthetic device, implant and graft, initial encounter: Secondary | ICD-10-CM

## 2017-02-07 NOTE — Telephone Encounter (Signed)
Phone call from pt.  Stated she saw Dr. Oneida Alar about 2 weeks ago, and she has a blockage of her fistula, and was given options for a Fistulogram or for a surgical revision.  Stated that she spoke with Dr. Lorrene Reid and due to her being CKD stage 4, she prefers that the pt. have a surgical revision of the AVF.  Advised that I will discuss with Dr. Oneida Alar, as the last office note only mentioned Fistulogram.  Informed pt. will have to call her back with Dr. Oneida Alar recommendation.  Verb. understanding.

## 2017-02-08 ENCOUNTER — Other Ambulatory Visit: Payer: Medicaid Other

## 2017-02-08 NOTE — Telephone Encounter (Signed)
Sched appt 02/10/17; lab at 8:00 and MD at 9:30. Lm on cell#.

## 2017-02-08 NOTE — Telephone Encounter (Signed)
Sent staff message to Dr. Oneida Alar for clarification on  plan for pt.: fistulogram versus surgical revision.  rec'd recommendation for scheduling in the office for further evaluation; see below. Pt. made aware that scheduler will contact her with appt. information.   RE: Fistulogram vs surgical revision  Received: Today  Message Contents  Fields, Jessy Oto, MD  Dawnmarie Breon, Joline Salt, RN        Ok lets see her back in the office and reduplex the fistula and have the tech mark the narrowed portion   [employer]

## 2017-02-09 ENCOUNTER — Encounter (HOSPITAL_COMMUNITY): Payer: Self-pay | Admitting: Emergency Medicine

## 2017-02-09 ENCOUNTER — Encounter: Payer: Self-pay | Admitting: Vascular Surgery

## 2017-02-09 ENCOUNTER — Ambulatory Visit (HOSPITAL_COMMUNITY)
Admission: EM | Admit: 2017-02-09 | Discharge: 2017-02-09 | Disposition: A | Discharge status: Home / Self Care | Payer: Medicaid Other | Attending: Urgent Care | Admitting: Urgent Care

## 2017-02-09 DIAGNOSIS — B9789 Other viral agents as the cause of diseases classified elsewhere: Secondary | ICD-10-CM

## 2017-02-09 DIAGNOSIS — IMO0002 Reserved for concepts with insufficient information to code with codable children: Secondary | ICD-10-CM

## 2017-02-09 DIAGNOSIS — E1322 Other specified diabetes mellitus with diabetic chronic kidney disease: Secondary | ICD-10-CM

## 2017-02-09 DIAGNOSIS — J069 Acute upper respiratory infection, unspecified: Secondary | ICD-10-CM | POA: Diagnosis not present

## 2017-02-09 DIAGNOSIS — M79644 Pain in right finger(s): Secondary | ICD-10-CM

## 2017-02-09 DIAGNOSIS — N184 Chronic kidney disease, stage 4 (severe): Secondary | ICD-10-CM

## 2017-02-09 DIAGNOSIS — E1365 Other specified diabetes mellitus with hyperglycemia: Secondary | ICD-10-CM

## 2017-02-09 DIAGNOSIS — J029 Acute pharyngitis, unspecified: Secondary | ICD-10-CM

## 2017-02-09 MED ORDER — HYDROCODONE-HOMATROPINE 5-1.5 MG/5ML PO SYRP: 5.0000 mL | ORAL_SOLUTION | Freq: Every evening | ORAL | 0 refills | Status: DC | PRN

## 2017-02-09 MED ORDER — BENZONATATE 100 MG PO CAPS: 100.0000 mg | ORAL_CAPSULE | Freq: Three times a day (TID) | ORAL | 0 refills | Status: DC | PRN

## 2017-02-09 NOTE — ED Provider Notes (Signed)
MRN: [id number] DOB: [birth year]  Subjective:   Cassandra Campos is a 46 y.o. female presenting for chief complaint of URI  Reports 2 day history of post-nasal drainage, nasal congestion, sinus headaches, sore throat, dry cough. Has not tried any medications. Denies fever, sinus pain, ear pain, ear drainage, chest pain, shob, wheezing, n/v, abdominal pain, rashes. Denies smoking cigarettes. Has uncontrolled type 2 diabetes, a1c was 12% last time it was checked on 01/23/2017, she is currently on insulin. Stage IV CKD.   No current facility-administered medications for this encounter.   Current Outpatient Prescriptions:  .  allopurinol (ZYLOPRIM) 300 MG tablet, Take 300 mg by mouth daily., Disp: , Rfl: 5 .  amLODipine (NORVASC) 5 MG tablet, Take 5 mg by mouth daily., Disp: , Rfl: 5 .  buPROPion (WELLBUTRIN XL) 150 MG 24 hr tablet, Take 1 tablet (150 mg total) by mouth daily., Disp: 30 tablet, Rfl: 5 .  Cholecalciferol (VITAMIN D3) 5000 UNITS CAPS, Take 5,000 Units by mouth daily. , Disp: , Rfl:  .  colchicine 0.6 MG tablet, Take 1 tablet (0.6 mg total) by mouth 2 (two) times daily. Take BID for acute gout attack, stop when acute pain resolves, Disp: 60 tablet, Rfl: 3 .  furosemide (LASIX) 80 MG tablet, Take 80 mg by mouth 2 (two) times daily., Disp: , Rfl:  .  HUMULIN R 500 UNIT/ML injection, INJECT 125 UNITS UNDER THE SKIN BEFORE BREAKFAST AND 85 UNITS BEFORE EVENING MEAL   THIRTY MINUTES BEFORE MEALS, Disp: , Rfl: 11 .  Insulin Pen Needle 32G X 4 MM MISC, Inject 1 each into the skin 5 (five) times daily., Disp: 150 each, Rfl: 5 .  IRON PO, Take 150 mg by mouth daily. , Disp: , Rfl:  .  LYRICA 100 MG capsule, Take 100 mg by mouth 2 (two) times daily., Disp: , Rfl: 2 .  metolazone (ZAROXOLYN) 2.5 MG tablet, 1 (ONE) TABLET 4 DAYS A WEEK, Disp: , Rfl: 5 .  modafinil (PROVIGIL) 200 MG tablet, Use a half tab in AM and if tired again use a 1/2 tab in PM., Disp: 45 tablet, Rfl: 1 .  ONETOUCH DELICA  LANCETS 63S MISC, , Disp: , Rfl:  .  potassium chloride (MICRO-K) 10 MEQ CR capsule, Take 30 mEq by mouth 3 (three) times daily. , Disp: , Rfl: 5 .  pravastatin (PRAVACHOL) 40 MG tablet, Take 40 mg by mouth daily., Disp: , Rfl:    Noemi has No Known Allergies.  Kadijah  has a past medical history of Anemia; Chronic kidney disease; Diabetes mellitus; Diabetic retinopathy ([place]); Fatty liver; Heart rate fast; High triglycerides; Hypercholesteremia; Hypertension; Neuropathy; Pneumonia; Sleep apnea; Stage 4 chronic kidney disease ([place]); and Umbilical hernia. Also  has a past surgical history that includes Cesarean section; Pelvic laparoscopy; Carpal tunnel release; Oophorectomy; Abdominal hysterectomy (FEB 2002); Eye surgery (Bilateral); AV fistula placement (Left, 07/04/2015); and Fistula superficialization (Left, 08/28/2015).  Objective:   Vitals: BP (!) 156/93 (BP Location: Right Arm)   Pulse 100   Temp 98.9 F (37.2 C)   Resp 16   Ht 5\' 3"  (1.6 m)   Wt 225 lb (102.1 kg)   SpO2 100%   BMI 39.86 kg/m   Physical Exam  Constitutional: She is oriented to person, place, and time. She appears well-developed and well-nourished.  HENT:  TM's intact bilaterally, no effusions or erythema. Nasal turbinates pink and moist, nasal passages patent. No sinus tenderness. Oropharynx with mild-moderate post-nasal drainage, mucous membranes moist.  Eyes:  Right eye exhibits no discharge. Left eye exhibits no discharge.  Neck: Normal range of motion. Neck supple.  Cardiovascular: Normal rate, regular rhythm and intact distal pulses.  Exam reveals no gallop and no friction rub.   No murmur heard. Pulmonary/Chest: No respiratory distress. She has no wheezes. She has no rales.  Lymphadenopathy:    She has no cervical adenopathy.  Neurological: She is alert and oriented to person, place, and time.  Skin: Skin is warm and dry.  Psychiatric: She has a normal mood and affect.   Assessment and Plan :   Viral  URI with cough  Sore throat  Finger pain, right  Uncontrolled secondary diabetes mellitus with stage 4 CKD (GFR 15-29) (HCC)  URI that is likely viral in nature, advised supportive care. If no improvement or symptoms do not resolve return to clinic in 1 week. Return-to-clinic precautions discussed, patient verbalized understanding. I counseled patient that her right pinky finger pain is more of a chronic issue and since our clinic has only 2 providers for the evening, I have to defer to her PCP. She denies recent trauma, bony deformity.   Jaynee Eagles, PA-C [facility]  02/09/2017  6:57 PM    Jaynee Eagles, PA-C 02/09/17 1936

## 2017-02-09 NOTE — ED Triage Notes (Signed)
PT reports sore throat, nasal drainage, and cough that started yesterday.   PT injured right 5th finger 3 weeks ago.

## 2017-02-10 ENCOUNTER — Other Ambulatory Visit: Payer: Self-pay

## 2017-02-10 ENCOUNTER — Other Ambulatory Visit: Payer: Self-pay | Admitting: Internal Medicine

## 2017-02-10 ENCOUNTER — Encounter: Payer: Self-pay | Admitting: Vascular Surgery

## 2017-02-10 ENCOUNTER — Ambulatory Visit (HOSPITAL_COMMUNITY)
Admission: RE | Admit: 2017-02-10 | Discharge: 2017-02-10 | Disposition: A | Discharge status: Home / Self Care | Payer: Medicaid Other | Source: Ambulatory Visit | Attending: Vascular Surgery | Admitting: Vascular Surgery

## 2017-02-10 ENCOUNTER — Ambulatory Visit (INDEPENDENT_AMBULATORY_CARE_PROVIDER_SITE_OTHER): Payer: Medicaid Other | Admitting: Vascular Surgery

## 2017-02-10 VITALS — BP 152/98 | HR 106 | Temp 98.2°F | Resp 18 | Ht 63.0 in | Wt 220.2 lb

## 2017-02-10 DIAGNOSIS — N183 Chronic kidney disease, stage 3 unspecified: Secondary | ICD-10-CM

## 2017-02-10 DIAGNOSIS — N184 Chronic kidney disease, stage 4 (severe): Secondary | ICD-10-CM

## 2017-02-10 DIAGNOSIS — Y838 Other surgical procedures as the cause of abnormal reaction of the patient, or of later complication, without mention of misadventure at the time of the procedure: Secondary | ICD-10-CM | POA: Diagnosis not present

## 2017-02-10 DIAGNOSIS — T829XXA Unspecified complication of cardiac and vascular prosthetic device, implant and graft, initial encounter: Secondary | ICD-10-CM | POA: Diagnosis not present

## 2017-02-10 DIAGNOSIS — N6310 Unspecified lump in the right breast, unspecified quadrant: Secondary | ICD-10-CM

## 2017-02-10 NOTE — Progress Notes (Signed)
Patient is a 46 year old female who returns for follow-up today. She previously had a left brachiocephalic AV fistula created. He has had diminished quality of the bruit of her fistula recently. She was last seen in early September and it was discussed with her the possibility of a fistulogram to evaluate narrowing of the fistula. However, since she is currently not on dialysis Dr. Lorrene Reid felt that giving her contrast would not be in her best interest as this could tip her over to end-stage renal disease. She is back today for further consideration for revision of the fistula. Previous duplex ultrasound in September showed possible narrowing adjacent to the anastomosis. There is also some aneurysmal degeneration in this area. Diameter was greater than 6 mm throughout most of the fistula. I reviewed the results of his ultrasound again today.  Review of systems: She denies any skin itching. She denies shortness of breath. She denies chest pain.  Physical exam:  Vitals:   02/10/17 0901 02/10/17 0903  BP: (!) 148/96 (!) 152/98  Pulse: (!) 106   Resp: 18   Temp: 98.2 F (36.8 C)   TempSrc: Oral   SpO2: 97%   Weight: 220 lb 3.2 oz (99.9 kg)   Height: 5\' 3"  (1.6 m)     Left upper extremity: Audible bruit palpable thrill in fistula in her data: Repeat duplex ultrasound shows narrowing adjacent to the antecubital fossa just adjacent to the anastomosis  Assessment: Non-maturing AV fistula left arm with possible narrowing in a patient currently not on hemodialysis.  Plan: Revision left arm AV fistula 02/14/2017. Risks benefits possible palpitations and procedure details were discussed the patient today including but not limited to bleeding infection fistula thrombosis ischemic steal. She understands and agrees to proceed.  Ruta Hinds, MD [facility] Office: [phone] Pager: [phone]

## 2017-02-11 ENCOUNTER — Ambulatory Visit
Admission: RE | Admit: 2017-02-11 | Discharge: 2017-02-11 | Disposition: A | Discharge status: Home / Self Care | Payer: Medicaid Other | Source: Ambulatory Visit | Attending: Internal Medicine | Admitting: Internal Medicine

## 2017-02-11 DIAGNOSIS — N6310 Unspecified lump in the right breast, unspecified quadrant: Secondary | ICD-10-CM

## 2017-02-11 DIAGNOSIS — R599 Enlarged lymph nodes, unspecified: Secondary | ICD-10-CM

## 2017-02-11 NOTE — Progress Notes (Signed)
Called pt earlier today for pre-op call, she was at work and asked if I could call her at lunch time. I did tell her that she needed to contact her endocrinologist today to find out how they wanted her to adjust her Humulin R 500 units Insulin. She stated that she would do that. I was unable to call her during her lunch break. I called her this afternoon but she was driving and I told her that I would call when she was home. I have tried several times and it goes to voicemail. I left pre-op instructions on pt's voicemail. I instructed her to follow endocrinologist's instructions for her insulin. I did instruct her to check her blood sugar Monday AM when she gets up. If blood sugar is 70 or below, treat with 1/2 cup of clear juice (apple or cranberry) and recheck blood sugar 15 minutes after drinking juice.

## 2017-02-13 NOTE — Anesthesia Preprocedure Evaluation (Addendum)
Anesthesia Evaluation  Patient identified by MRN, date of birth, ID band Patient awake    Reviewed: Allergy & Precautions, NPO status , Patient's Chart, lab work & pertinent test results  Airway Mallampati: III  TM Distance: >3 FB Neck ROM: Full    Dental  (+) Teeth Intact, Dental Advisory Given   Pulmonary neg pulmonary ROS, sleep apnea and Continuous Positive Airway Pressure Ventilation ,    Pulmonary exam normal breath sounds clear to auscultation       Cardiovascular Exercise Tolerance: Poor hypertension, Pt. on medications (-) angina(-) CAD and (-) Past MI Normal cardiovascular exam Rhythm:Regular Rate:Normal     Neuro/Psych Neuropathy  negative psych ROS   GI/Hepatic negative GI ROS, Neg liver ROS,   Endo/Other  diabetes, Type 2, Insulin DependentMorbid obesity  Renal/GU Renal InsufficiencyRenal diseaseRI STAGE IV     Musculoskeletal negative musculoskeletal ROS (+)   Abdominal   Peds  Hematology  (+) Blood dyscrasia, anemia ,   Anesthesia Other Findings   Reproductive/Obstetrics negative OB ROS                             Anesthesia Physical  Anesthesia Plan  ASA: III  Anesthesia Plan: MAC   Post-op Pain Management:    Induction: Intravenous  PONV Risk Score and Plan: 2 and Ondansetron and Dexamethasone  Airway Management Planned: Mask and Natural Airway  Additional Equipment:   Intra-op Plan:   Post-operative Plan:   Informed Consent: I have reviewed the patients History and Physical, chart, labs and discussed the procedure including the risks, benefits and alternatives for the proposed anesthesia with the patient or authorized representative who has indicated his/her understanding and acceptance.   Dental Advisory Given  Plan Discussed with: Anesthesiologist, CRNA and Surgeon  Anesthesia Plan Comments: (Check AM labs, MAC before, surgeon requesting GA)       Anesthesia Quick Evaluation

## 2017-02-14 ENCOUNTER — Ambulatory Visit (HOSPITAL_COMMUNITY): Payer: Medicaid Other | Admitting: Certified Registered Nurse Anesthetist

## 2017-02-14 ENCOUNTER — Ambulatory Visit (HOSPITAL_COMMUNITY)
Admission: RE | Admit: 2017-02-14 | Discharge: 2017-02-14 | Disposition: A | Discharge status: Home / Self Care | Payer: Medicaid Other | Source: Ambulatory Visit | Attending: Vascular Surgery | Admitting: Vascular Surgery

## 2017-02-14 ENCOUNTER — Encounter (HOSPITAL_COMMUNITY)
Admission: RE | Disposition: A | Discharge status: Home / Self Care | Payer: Self-pay | Source: Ambulatory Visit | Attending: Vascular Surgery

## 2017-02-14 DIAGNOSIS — Z6841 Body Mass Index (BMI) 40.0 and over, adult: Secondary | ICD-10-CM | POA: Insufficient documentation

## 2017-02-14 DIAGNOSIS — I721 Aneurysm of artery of upper extremity: Secondary | ICD-10-CM | POA: Diagnosis not present

## 2017-02-14 DIAGNOSIS — Z79899 Other long term (current) drug therapy: Secondary | ICD-10-CM | POA: Insufficient documentation

## 2017-02-14 DIAGNOSIS — E669 Obesity, unspecified: Secondary | ICD-10-CM | POA: Diagnosis not present

## 2017-02-14 DIAGNOSIS — E1122 Type 2 diabetes mellitus with diabetic chronic kidney disease: Secondary | ICD-10-CM | POA: Insufficient documentation

## 2017-02-14 DIAGNOSIS — N184 Chronic kidney disease, stage 4 (severe): Secondary | ICD-10-CM | POA: Diagnosis not present

## 2017-02-14 DIAGNOSIS — Y832 Surgical operation with anastomosis, bypass or graft as the cause of abnormal reaction of the patient, or of later complication, without mention of misadventure at the time of the procedure: Secondary | ICD-10-CM | POA: Diagnosis not present

## 2017-02-14 DIAGNOSIS — G4730 Sleep apnea, unspecified: Secondary | ICD-10-CM | POA: Diagnosis not present

## 2017-02-14 DIAGNOSIS — T82898A Other specified complication of vascular prosthetic devices, implants and grafts, initial encounter: Secondary | ICD-10-CM | POA: Diagnosis not present

## 2017-02-14 DIAGNOSIS — E1142 Type 2 diabetes mellitus with diabetic polyneuropathy: Secondary | ICD-10-CM | POA: Insufficient documentation

## 2017-02-14 DIAGNOSIS — Z794 Long term (current) use of insulin: Secondary | ICD-10-CM | POA: Diagnosis not present

## 2017-02-14 DIAGNOSIS — I129 Hypertensive chronic kidney disease with stage 1 through stage 4 chronic kidney disease, or unspecified chronic kidney disease: Secondary | ICD-10-CM | POA: Diagnosis not present

## 2017-02-14 HISTORY — PX: PATCH ANGIOPLASTY: SHX6230

## 2017-02-14 HISTORY — PX: REVISON OF ARTERIOVENOUS FISTULA: SHX6074

## 2017-02-14 LAB — POCT I-STAT 4, (NA,K, GLUC, HGB,HCT)
GLUCOSE: 150 mg/dL — AB (ref 65–99)
HEMATOCRIT: 34 % — AB (ref 36.0–46.0)
Hemoglobin: 11.6 g/dL — ABNORMAL LOW (ref 12.0–15.0)
POTASSIUM: 3.3 mmol/L — AB (ref 3.5–5.1)
SODIUM: 140 mmol/L (ref 135–145)

## 2017-02-14 LAB — GLUCOSE, CAPILLARY: Glucose-Capillary: 111 mg/dL — ABNORMAL HIGH (ref 65–99)

## 2017-02-14 SURGERY — REVISON OF ARTERIOVENOUS FISTULA
Anesthesia: Monitor Anesthesia Care | Site: Arm Upper | Laterality: Left

## 2017-02-14 MED ORDER — LIDOCAINE HCL (CARDIAC) 20 MG/ML IV SOLN
INTRAVENOUS | Status: DC | PRN
  Administered 2017-02-14: 40 mg via INTRAVENOUS

## 2017-02-14 MED ORDER — FENTANYL CITRATE (PF) 250 MCG/5ML IJ SOLN
INTRAMUSCULAR | Status: AC
  Filled 2017-02-14: qty 5

## 2017-02-14 MED ORDER — DEXAMETHASONE SODIUM PHOSPHATE 10 MG/ML IJ SOLN
INTRAMUSCULAR | Status: AC
  Filled 2017-02-14: qty 1

## 2017-02-14 MED ORDER — PROPOFOL 500 MG/50ML IV EMUL
INTRAVENOUS | Status: DC | PRN
  Administered 2017-02-14 (×2): via INTRAVENOUS
  Administered 2017-02-14: 75 ug/kg/min via INTRAVENOUS

## 2017-02-14 MED ORDER — SODIUM CHLORIDE 0.9 % IV SOLN
INTRAVENOUS | Status: DC | PRN
  Administered 2017-02-14: 500 mL

## 2017-02-14 MED ORDER — HEPARIN SODIUM (PORCINE) 1000 UNIT/ML IJ SOLN
INTRAMUSCULAR | Status: DC | PRN
  Administered 2017-02-14: 5000 [IU] via INTRAVENOUS

## 2017-02-14 MED ORDER — SODIUM CHLORIDE 0.9 % IV SOLN: INTRAVENOUS | Status: DC

## 2017-02-14 MED ORDER — LIDOCAINE HCL (PF) 1 % IJ SOLN
INTRAMUSCULAR | Status: DC | PRN
  Administered 2017-02-14: 14 mL

## 2017-02-14 MED ORDER — CHLORHEXIDINE GLUCONATE CLOTH 2 % EX PADS: 6.0000 | MEDICATED_PAD | Freq: Once | CUTANEOUS | Status: DC

## 2017-02-14 MED ORDER — PROTAMINE SULFATE 10 MG/ML IV SOLN
INTRAVENOUS | Status: AC
  Filled 2017-02-14: qty 5

## 2017-02-14 MED ORDER — PROPOFOL 10 MG/ML IV BOLUS
INTRAVENOUS | Status: AC
  Filled 2017-02-14: qty 20

## 2017-02-14 MED ORDER — DEXTROSE 5 % IV SOLN
1.5000 g | INTRAVENOUS | Status: AC
  Administered 2017-02-14: 1.5 g via INTRAVENOUS
  Filled 2017-02-14: qty 1.5

## 2017-02-14 MED ORDER — MIDAZOLAM HCL 5 MG/5ML IJ SOLN
INTRAMUSCULAR | Status: DC | PRN
  Administered 2017-02-14: 2 mg via INTRAVENOUS

## 2017-02-14 MED ORDER — SODIUM CHLORIDE 0.9 % IV SOLN
INTRAVENOUS | Status: DC | PRN
  Administered 2017-02-14: 07:00:00 via INTRAVENOUS

## 2017-02-14 MED ORDER — FENTANYL CITRATE (PF) 100 MCG/2ML IJ SOLN
INTRAMUSCULAR | Status: DC | PRN
  Administered 2017-02-14: 25 ug via INTRAVENOUS
  Administered 2017-02-14: 50 ug via INTRAVENOUS

## 2017-02-14 MED ORDER — 0.9 % SODIUM CHLORIDE (POUR BTL) OPTIME
TOPICAL | Status: DC | PRN
  Administered 2017-02-14: 1000 mL

## 2017-02-14 MED ORDER — LIDOCAINE HCL 1 % IJ SOLN
INTRAMUSCULAR | Status: AC
  Filled 2017-02-14: qty 40

## 2017-02-14 MED ORDER — MIDAZOLAM HCL 2 MG/2ML IJ SOLN
INTRAMUSCULAR | Status: AC
  Filled 2017-02-14: qty 2

## 2017-02-14 MED ORDER — OXYCODONE-ACETAMINOPHEN 5-325 MG PO TABS: 1.0000 | ORAL_TABLET | Freq: Four times a day (QID) | ORAL | 0 refills | Status: DC | PRN

## 2017-02-14 MED ORDER — PROTAMINE SULFATE 10 MG/ML IV SOLN
INTRAVENOUS | Status: DC | PRN
  Administered 2017-02-14: 20 mg via INTRAVENOUS
  Administered 2017-02-14: 10 mg via INTRAVENOUS
  Administered 2017-02-14: 20 mg via INTRAVENOUS

## 2017-02-14 MED ORDER — PROPOFOL 10 MG/ML IV BOLUS
INTRAVENOUS | Status: DC | PRN
  Administered 2017-02-14: 10 mg via INTRAVENOUS

## 2017-02-14 MED ORDER — LIDOCAINE 2% (20 MG/ML) 5 ML SYRINGE
INTRAMUSCULAR | Status: AC
  Filled 2017-02-14: qty 5

## 2017-02-14 MED ORDER — HEPARIN SODIUM (PORCINE) 1000 UNIT/ML IJ SOLN
INTRAMUSCULAR | Status: AC
  Filled 2017-02-14: qty 2

## 2017-02-14 SURGICAL SUPPLY — 36 items
ARMBAND PINK RESTRICT EXTREMIT (MISCELLANEOUS) ×2 IMPLANT
CANISTER SUCT 3000ML PPV (MISCELLANEOUS) ×2 IMPLANT
CANNULA VESSEL 3MM 2 BLNT TIP (CANNULA) ×2 IMPLANT
CLIP VESOCCLUDE MED 6/CT (CLIP) ×2 IMPLANT
CLIP VESOCCLUDE SM WIDE 6/CT (CLIP) ×2 IMPLANT
COVER PROBE W GEL 5X96 (DRAPES) IMPLANT
DECANTER SPIKE VIAL GLASS SM (MISCELLANEOUS) ×2 IMPLANT
DERMABOND ADVANCED (GAUZE/BANDAGES/DRESSINGS) ×1
DERMABOND ADVANCED .7 DNX12 (GAUZE/BANDAGES/DRESSINGS) ×1 IMPLANT
DRAIN PENROSE 1/4X12 LTX STRL (WOUND CARE) ×2 IMPLANT
ELECT REM PT RETURN 9FT ADLT (ELECTROSURGICAL) ×2
ELECTRODE REM PT RTRN 9FT ADLT (ELECTROSURGICAL) ×1 IMPLANT
GLOVE BIO SURGEON STRL SZ7.5 (GLOVE) ×2 IMPLANT
GLOVE BIOGEL PI IND STRL 6.5 (GLOVE) ×1 IMPLANT
GLOVE BIOGEL PI INDICATOR 6.5 (GLOVE) ×1
GLOVE SURG SS PI 7.0 STRL IVOR (GLOVE) ×4 IMPLANT
GOWN STRL REUS W/ TWL LRG LVL3 (GOWN DISPOSABLE) ×3 IMPLANT
GOWN STRL REUS W/ TWL XL LVL3 (GOWN DISPOSABLE) ×2 IMPLANT
GOWN STRL REUS W/TWL LRG LVL3 (GOWN DISPOSABLE) ×3
GOWN STRL REUS W/TWL XL LVL3 (GOWN DISPOSABLE) ×2
HEMOSTAT SPONGE AVITENE ULTRA (HEMOSTASIS) IMPLANT
KIT BASIN OR (CUSTOM PROCEDURE TRAY) ×2 IMPLANT
KIT ROOM TURNOVER OR (KITS) ×2 IMPLANT
LOOP VESSEL MINI RED (MISCELLANEOUS) ×2 IMPLANT
NS IRRIG 1000ML POUR BTL (IV SOLUTION) ×2 IMPLANT
PACK CV ACCESS (CUSTOM PROCEDURE TRAY) ×2 IMPLANT
PAD ARMBOARD 7.5X6 YLW CONV (MISCELLANEOUS) ×4 IMPLANT
PATCH VASC XENOSURE 1CMX6CM (Vascular Products) ×1 IMPLANT
PATCH VASC XENOSURE 1X6 (Vascular Products) ×1 IMPLANT
SUT PROLENE 6 0 CC (SUTURE) ×6 IMPLANT
SUT PROLENE 7 0 BV 1 (SUTURE) ×4 IMPLANT
SUT VIC AB 3-0 SH 27 (SUTURE) ×1
SUT VIC AB 3-0 SH 27X BRD (SUTURE) ×1 IMPLANT
SUT VICRYL 4-0 PS2 18IN ABS (SUTURE) ×2 IMPLANT
UNDERPAD 30X30 (UNDERPADS AND DIAPERS) ×2 IMPLANT
WATER STERILE IRR 1000ML POUR (IV SOLUTION) ×2 IMPLANT

## 2017-02-14 NOTE — Anesthesia Procedure Notes (Signed)
Procedure Name: MAC Date/Time: 02/14/2017 7:57 AM Performed by: Neldon Newport Pre-anesthesia Checklist: Timeout performed, Patient being monitored, Suction available, Emergency Drugs available and Patient identified Oxygen Delivery Method: Simple face mask

## 2017-02-14 NOTE — Progress Notes (Signed)
Report given to ronda hunt rn as caregiver 

## 2017-02-14 NOTE — Interval H&P Note (Signed)
History and Physical Interval Note:  02/14/2017 7:47 AM  Cassandra Campos  has presented today for surgery, with the diagnosis of Chronic Kidney Disease Stage 4  N18.4  The various methods of treatment have been discussed with the patient and family. After consideration of risks, benefits and other options for treatment, the patient has consented to  Procedure(s): REVISON OF ARTERIOVENOUS FISTULA  LEFT ARM (Left) as a surgical intervention .  The patient's history has been reviewed, patient examined, no change in status, stable for surgery.  I have reviewed the patient's chart and labs.  Questions were answered to the patient's satisfaction.     Ruta Hinds

## 2017-02-14 NOTE — H&P (View-Only) (Signed)
Patient is a 46 year old female who returns for follow-up today. She previously had a left brachiocephalic AV fistula created. He has had diminished quality of the bruit of her fistula recently. She was last seen in early September and it was discussed with her the possibility of a fistulogram to evaluate narrowing of the fistula. However, since she is currently not on dialysis Dr. Lorrene Reid felt that giving her contrast would not be in her best interest as this could tip her over to end-stage renal disease. She is back today for further consideration for revision of the fistula. Previous duplex ultrasound in September showed possible narrowing adjacent to the anastomosis. There is also some aneurysmal degeneration in this area. Diameter was greater than 6 mm throughout most of the fistula. I reviewed the results of his ultrasound again today.  Review of systems: She denies any skin itching. She denies shortness of breath. She denies chest pain.  Physical exam:  Vitals:   02/10/17 0901 02/10/17 0903  BP: (!) 148/96 (!) 152/98  Pulse: (!) 106   Resp: 18   Temp: 98.2 F (36.8 C)   TempSrc: Oral   SpO2: 97%   Weight: 220 lb 3.2 oz (99.9 kg)   Height: 5\' 3"  (1.6 m)     Left upper extremity: Audible bruit palpable thrill in fistula in her data: Repeat duplex ultrasound shows narrowing adjacent to the antecubital fossa just adjacent to the anastomosis  Assessment: Non-maturing AV fistula left arm with possible narrowing in a patient currently not on hemodialysis.  Plan: Revision left arm AV fistula 02/14/2017. Risks benefits possible palpitations and procedure details were discussed the patient today including but not limited to bleeding infection fistula thrombosis ischemic steal. She understands and agrees to proceed.  Ruta Hinds, MD [facility] Office: [phone] Pager: [phone]

## 2017-02-14 NOTE — Transfer of Care (Signed)
Immediate Anesthesia Transfer of Care Note  Patient: Cassandra Campos  Procedure(s) Performed: REVISON OF ARTERIOVENOUS FISTULA  LEFT ARM (Left Arm Upper) PATCH ANGIOPLASTY (Left Arm Upper)  Patient Location: PACU  Anesthesia Type:MAC  Level of Consciousness: awake, alert  and oriented  Airway & Oxygen Therapy: Patient Spontanous Breathing and Patient connected to nasal cannula oxygen  Post-op Assessment: Report given to RN, Post -op Vital signs reviewed and stable and Patient moving all extremities X 4  Post vital signs: Reviewed and stable  Last Vitals:  Vitals:   02/14/17 0626  BP: (!) 161/98  Pulse: 95  Resp: 18  Temp: 36.9 C  SpO2: 98%    Last Pain:  Vitals:   02/14/17 0626  TempSrc: Oral         Complications: No apparent anesthesia complications

## 2017-02-14 NOTE — Op Note (Signed)
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 Aneurysm anterior wall resected and fistula reconstructed using native posterior wall and bovine pericardial patch anteriorly  Operative details: After obtaining informed consent, the patient was taken to the operating. The patient was placed in supine position operating table. After adequate sedation the patient's entire left upper extremity was prepped and draped in the usual sterile fashion. Local anesthesia was infiltrated near the antecubital crease. The incision was made in this location carried into the saphenous tissues down level the fistula. There was fairly dense scar tissue around the arterial anastomosis this was all taken down with cautery and sharp dissection. The brachial artery was dissected free circumferentially proximal and distal anastomosis. There was about a 1/2 cm segment of fairly normal-appearing vein and then the fistula became aneurysmal with about a 3 cm true aneurysm. The fistula was dissected free circumferentially just above this. It was about 7 mm in diameter above this. The patient was given 5000 units of intravenous heparin. The fistula was controlled with fine bulldog clamp and the artery controlled proximal and distal Vesseloops. Longitudinal opening was made in the aneurysm. I opened this longitudinally all the way down onto the arterial anastomosis. There was thickened intimal hyperplasia and subtotal occlusion of the anastomosis. This was opened down onto the artery to make a wide lumen. I then resected two thirds of the aneurysm leaving the posterior wall intact which seemed to be reasonable tissue. I then reconstructed the posterior wall of the fistula using a running 7-0 Prolene  suture. The anterior wall of the fistula was then reconstructed using a patch angioplasty using bovine pericardium. This was done with a running 6-0 Prolene suture. Just prior completion of the anastomosis it was forebled backbled and thoroughly flushed. The anastomosis was secured clamps released and there is palpable thrill in the fistula immediately. The patient also had a palpable radial pulse. Hemostasis was obtained by administering 59 g of protamine. Direct pressure was also applied. The incision was then closed in multiple layers using running 3-0 Vicryl and 4-0 Vicryl subcuticular stitch. Dermabond was applied. The patient tolerated the procedure well and there were no complications. Instrument sponge and needle count was correct at the end of the case. The patient was taken to recover in stable condition.  Cassandra Hinds, MD [facility] Office: [phone] Pager: [phone]

## 2017-02-14 NOTE — Anesthesia Postprocedure Evaluation (Signed)
Anesthesia Post Note  Patient: Cassandra Campos  Procedure(s) Performed: REVISON OF ARTERIOVENOUS FISTULA  LEFT ARM (Left Arm Upper) PATCH ANGIOPLASTY (Left Arm Upper)     Patient location during evaluation: PACU Anesthesia Type: MAC Level of consciousness: awake and alert Pain management: pain level controlled Vital Signs Assessment: post-procedure vital signs reviewed and stable Respiratory status: spontaneous breathing, nonlabored ventilation, respiratory function stable and patient connected to nasal cannula oxygen Cardiovascular status: stable and blood pressure returned to baseline Postop Assessment: no apparent nausea or vomiting Anesthetic complications: no    Last Vitals:  Vitals:   02/14/17 1129 02/14/17 1130  BP:  (!) 160/98  Pulse: (!) 102 (!) 101  Resp: 13 13  Temp: (!) 36.1 C   SpO2: 96% 96%    Last Pain:  Vitals:   02/14/17 1130  TempSrc:   PainSc: 0-No pain                 Alycea Segoviano EDWARD

## 2017-02-15 ENCOUNTER — Encounter (HOSPITAL_COMMUNITY): Payer: Self-pay | Admitting: Vascular Surgery

## 2017-02-16 ENCOUNTER — Telehealth: Payer: Self-pay | Admitting: Vascular Surgery

## 2017-02-16 NOTE — Telephone Encounter (Signed)
-----   Message from Mena Goes, RN sent at 02/14/2017 12:17 PM EDT ----- Regarding: 4 weeks w/ duplex   ----- Message ----- From: Ulyses Amor, PA-C Sent: 02/14/2017  10:09 AM To: [facility] Charge Pool  F/U with DR. Fields in 4 weeks s/p revision left AV fistula needs duplex of fistula

## 2017-02-16 NOTE — Telephone Encounter (Signed)
Sched lab 03/16/17 at 4:00 and MD 03/17/17 at 3:00. Lm on cell#.

## 2017-02-17 ENCOUNTER — Other Ambulatory Visit: Payer: Self-pay

## 2017-02-17 DIAGNOSIS — Z48812 Encounter for surgical aftercare following surgery on the circulatory system: Secondary | ICD-10-CM

## 2017-02-17 DIAGNOSIS — N184 Chronic kidney disease, stage 4 (severe): Secondary | ICD-10-CM

## 2017-02-22 ENCOUNTER — Other Ambulatory Visit: Payer: Self-pay | Admitting: Neurology

## 2017-02-23 ENCOUNTER — Encounter: Payer: Medicaid Other | Admitting: Gynecology

## 2017-02-25 ENCOUNTER — Telehealth: Payer: Self-pay | Admitting: *Deleted

## 2017-02-25 NOTE — Telephone Encounter (Signed)
Patient called stating that her left hand is numb.  She has returned to work and types often and she is opening and closing her hand.  I instructed patient to elevate her left arm on one or two pillows to have it above the level of her heart and to do this while sleeping and whenever possible at work. She voiced understanding of the instructions and will call if symptoms should worsen.

## 2017-03-09 DIAGNOSIS — E113512 Type 2 diabetes mellitus with proliferative diabetic retinopathy with macular edema, left eye: Secondary | ICD-10-CM | POA: Diagnosis not present

## 2017-03-09 DIAGNOSIS — E113511 Type 2 diabetes mellitus with proliferative diabetic retinopathy with macular edema, right eye: Secondary | ICD-10-CM | POA: Diagnosis not present

## 2017-03-15 ENCOUNTER — Encounter: Payer: Self-pay | Admitting: Internal Medicine

## 2017-03-15 ENCOUNTER — Other Ambulatory Visit (INDEPENDENT_AMBULATORY_CARE_PROVIDER_SITE_OTHER): Payer: Medicaid Other

## 2017-03-15 ENCOUNTER — Ambulatory Visit (INDEPENDENT_AMBULATORY_CARE_PROVIDER_SITE_OTHER): Payer: Medicaid Other | Admitting: Internal Medicine

## 2017-03-15 VITALS — BP 152/86 | HR 88 | Ht 62.5 in | Wt 228.1 lb

## 2017-03-15 DIAGNOSIS — Z1211 Encounter for screening for malignant neoplasm of colon: Secondary | ICD-10-CM

## 2017-03-15 DIAGNOSIS — R140 Abdominal distension (gaseous): Secondary | ICD-10-CM

## 2017-03-15 DIAGNOSIS — R945 Abnormal results of liver function studies: Secondary | ICD-10-CM

## 2017-03-15 DIAGNOSIS — K760 Fatty (change of) liver, not elsewhere classified: Secondary | ICD-10-CM

## 2017-03-15 DIAGNOSIS — R7989 Other specified abnormal findings of blood chemistry: Secondary | ICD-10-CM

## 2017-03-15 LAB — CBC WITH DIFFERENTIAL/PLATELET
Basophils Absolute: 0.1 K/uL (ref 0.0–0.1)
Basophils Relative: 0.9 % (ref 0.0–3.0)
Eosinophils Absolute: 0.5 K/uL (ref 0.0–0.7)
Eosinophils Relative: 8.1 % — ABNORMAL HIGH (ref 0.0–5.0)
HCT: 38 % (ref 36.0–46.0)
Hemoglobin: 13.1 g/dL (ref 12.0–15.0)
Lymphocytes Relative: 40.3 % (ref 12.0–46.0)
Lymphs Abs: 2.4 K/uL (ref 0.7–4.0)
MCHC: 34.6 g/dL (ref 30.0–36.0)
MCV: 87.8 fl (ref 78.0–100.0)
Monocytes Absolute: 0.5 K/uL (ref 0.1–1.0)
Monocytes Relative: 9.1 % (ref 3.0–12.0)
Neutro Abs: 2.5 K/uL (ref 1.4–7.7)
Neutrophils Relative %: 41.6 % — ABNORMAL LOW (ref 43.0–77.0)
Platelets: 296 K/uL (ref 150.0–400.0)
RBC: 4.33 Mil/uL (ref 3.87–5.11)
RDW: 14 % (ref 11.5–15.5)
WBC: 5.9 K/uL (ref 4.0–10.5)

## 2017-03-15 LAB — IBC PANEL
IRON: 56 ug/dL (ref 42–145)
SATURATION RATIOS: 14.7 % — AB (ref 20.0–50.0)
TRANSFERRIN: 273 mg/dL (ref 212.0–360.0)

## 2017-03-15 LAB — PROTIME-INR
INR: 1 ratio (ref 0.8–1.0)
Prothrombin Time: 10.9 s (ref 9.6–13.1)

## 2017-03-15 LAB — HEPATIC FUNCTION PANEL
ALT: 51 U/L — ABNORMAL HIGH (ref 0–35)
AST: 40 U/L — ABNORMAL HIGH (ref 0–37)
Albumin: 4.1 g/dL (ref 3.5–5.2)
Alkaline Phosphatase: 79 U/L (ref 39–117)
BILIRUBIN DIRECT: 0.1 mg/dL (ref 0.0–0.3)
BILIRUBIN TOTAL: 0.7 mg/dL (ref 0.2–1.2)
Total Protein: 8 g/dL (ref 6.0–8.3)

## 2017-03-15 LAB — IRON: Iron: 56 ug/dL (ref 42–145)

## 2017-03-15 LAB — FERRITIN: Ferritin: 404.4 ng/mL — ABNORMAL HIGH (ref 10.0–291.0)

## 2017-03-15 MED ORDER — NA SULFATE-K SULFATE-MG SULF 17.5-3.13-1.6 GM/177ML PO SOLN: 1.0000 | Freq: Once | ORAL | 0 refills | Status: AC

## 2017-03-15 NOTE — Progress Notes (Signed)
HISTORY OF PRESENT ILLNESS:  Cassandra Campos is a 46 y.o. female with diabetes mellitus on insulin, hypertension, hyperlipidemia, morbid obesity, sleep apnea, and stage IV chronic kidney disease status post AV fistula placement not yet on dialysis. She was initially evaluated in this office as a new patient by the GI physician assistant 09/10/2016 regarding elevated hepatic transaminases, abdominal cramping, and alternating bowel habits. Considerations included fatty liver disease or reaction to minocycline. The antibiotic was stopped. Follow-up liver tests remain abnormal but improved with transaminases less than 75. Normal bilirubin and alkaline phosphatase. Looking back at previous liver tests these were normal in 2014 and again at the end of 2017. CBCs with normal platelets. Previous imaging studies dating back to 2003 including CT scan and ultrasound revealed hepatic changes of fatty liver. Ultrasound in February 2018 revealing the same. Most recent AST 58 and ALT 71. Previous hepatitis B and C serologies were negative. Other ancillary testing not performed. She presents at this time. She continues with slightly alternating bowel habits and bloating. No bleeding. She has been advised regarding screening colonoscopy and is interested. She does have occasional reflux symptoms without dysphagia. She has been off minocycline. Other labs reviewed. She presents today to discuss her liver.  REVIEW OF SYSTEMS:  All non-GI ROS negative unless otherwise stated in the history of present illness except for sinus and allergy, anxiety, breast changes, visual change, itching, muscle cramps, night sweats, urinary leakage, excessive urination, excessive thirst, sleeping problems, skin rash  Past Medical History:  Diagnosis Date  . Anemia   . Chronic kidney disease   . Diabetes mellitus    type 2  . Diabetic retinopathy ([place])   . Fatty liver   . Heart rate fast   . High triglycerides   . Hypercholesteremia   .  Hypertension   . Neuropathy   . Pneumonia   . Sleep apnea    has not started using Cpap  . Stage 4 chronic kidney disease ([place])   . Umbilical hernia     Past Surgical History:  Procedure Laterality Date  . ABDOMINAL HYSTERECTOMY  FEB 2002   TAH/BSO for irregular bleeding and leiomyoma  . AV FISTULA PLACEMENT Left 07/04/2015   Procedure: ARTERIOVENOUS (AV) FISTULA CREATION-LEFT;  Surgeon: Mal Misty, MD;  Location: [place];  Service: Vascular;  Laterality: Left;  . CARPAL TUNNEL RELEASE     X 2  . CESAREAN SECTION     X 2  . EYE SURGERY Bilateral    Retina reattachment  . FISTULA SUPERFICIALIZATION Left 08/28/2015   Procedure: BRACHIOCEPHALIC FISTULA SUPERFICIALIZATION;  Surgeon: Mal Misty, MD;  Location: [place];  Service: Vascular;  Laterality: Left;  . OOPHORECTOMY     BSO  . PATCH ANGIOPLASTY Left 02/14/2017   Procedure: PATCH ANGIOPLASTY;  Surgeon: Elam Dutch, MD;  Location: [place];  Service: Vascular;  Laterality: Left;  . PELVIC LAPAROSCOPY    . REVISON OF ARTERIOVENOUS FISTULA Left 02/14/2017   Procedure: REVISON OF ARTERIOVENOUS FISTULA  LEFT ARM;  Surgeon: Elam Dutch, MD;  Location: [place];  Service: Vascular;  Laterality: Left;    Social History Cassandra Campos  reports that she has never smoked. She has never used smokeless tobacco. She reports that she drinks alcohol. She reports that she does not use drugs.  family history includes Cancer in her father; Diabetes in her father and mother; Hypertension in her father and mother; Stroke in her maternal grandfather and maternal grandmother.  No Known Allergies  PHYSICAL EXAMINATION: Vital signs: BP (!) 152/86 (BP Location: Right Arm, Patient Position: Sitting, Cuff Size: Large)   Pulse 88   Ht 5' 2.5" (1.588 m)   Wt 228 lb 2 oz (103.5 kg)   BMI 41.06 kg/m   Constitutional: Pleasant, obese, generally well-appearing, no acute distress Psychiatric: alert and oriented x3, cooperative Eyes:  extraocular movements intact, anicteric, conjunctiva pink Mouth: oral pharynx moist, no lesions Neck: supple no lymphadenopathy Cardiovascular: heart regular rate and rhythm, no murmur Lungs: clear to auscultation bilaterally Abdomen: soft, obese, nontender, nondistended, no obvious ascites, no peritoneal signs, normal bowel sounds, no organomegaly Rectal: Deferred until colonoscopy Extremities: no clubbing, cyanosis, or lower extremity edema bilaterally. AV fistula on the left upper extremity Skin: no lesions on visible extremities Neuro: No focal deficits. Cranial nerves intact. No asterixis.   ASSESSMENT:  #1. Elevated hepatic transaminases. Persistent from April to August. Suspect fatty liver. Rule out other causes of elevated hepatic transaminases chronically #2. Morbid obesity #3. Alternating bowel habits with bloating without alarm features #4. Colon cancer screening. Baseline risk #5. Multiple medical problems including insulin requiring diabetes and advanced renal disease   PLAN:  #1. Supplemental nonviral studies to include autoimmune markers, iron studies, PT/INR, and repeat liver tests #2. We discussed fatty liver disease including benign fatty liver disease, NASH, and Nash with evolution to cirrhosis with its complications. To this end, regular exercise and gradual but ongoing weight loss recommended. She understands and is motivated #3. Screening colonoscopy. The patient is High Risk given her body habitus, diabetes requiring insulin and the need to adjust her insulin for the procedure, and renal insufficiency.The nature of the procedure, as well as the risks, benefits, and alternatives were carefully and thoroughly reviewed with the patient. Ample time for discussion and questions allowed. The patient understood, was satisfied, and agreed to proceed. #4. Hold a.m. insulin the day of the procedure to avoid hypoglycemia

## 2017-03-15 NOTE — Patient Instructions (Signed)
Your physician has requested that you go to the basement for lab work before leaving today  You have been scheduled for a colonoscopy. Please follow written instructions given to you at your visit today.  Please pick up your prep supplies at the pharmacy within the next 1-3 days. If you use inhalers (even only as needed), please bring them with you on the day of your procedure. Your physician has requested that you go to www.startemmi.com and enter the access code given to you at your visit today. This web site gives a general overview about your procedure. However, you should still follow specific instructions given to you by our office regarding your preparation for the procedure.   Work on exercise and weight loss

## 2017-03-16 ENCOUNTER — Encounter (HOSPITAL_COMMUNITY): Payer: Medicaid Other

## 2017-03-17 ENCOUNTER — Encounter: Payer: Medicaid Other | Admitting: Vascular Surgery

## 2017-03-22 LAB — CERULOPLASMIN: CERULOPLASMIN: 35 mg/dL (ref 18–53)

## 2017-03-22 LAB — ANTI-SMOOTH MUSCLE ANTIBODY, IGG: Actin (Smooth Muscle) Antibody (IGG): <20 U (ref <20)

## 2017-03-22 LAB — MITOCHONDRIAL ANTIBODIES

## 2017-03-22 LAB — ANA: ANA: POSITIVE — AB

## 2017-03-22 LAB — ALPHA-1-ANTITRYPSIN: A1 ANTITRYPSIN SER: 146 mg/dL (ref 83–199)

## 2017-03-22 LAB — ANTI-NUCLEAR AB-TITER (ANA TITER): ANA Titer 1: 1:80 {titer} — ABNORMAL HIGH

## 2017-03-24 ENCOUNTER — Encounter: Payer: Self-pay | Admitting: Internal Medicine

## 2017-03-24 ENCOUNTER — Ambulatory Visit (AMBULATORY_SURGERY_CENTER): Payer: Medicaid Other | Admitting: Internal Medicine

## 2017-03-24 ENCOUNTER — Other Ambulatory Visit: Payer: Self-pay

## 2017-03-24 VITALS — BP 156/95 | HR 87 | Temp 97.3°F | Resp 18 | Ht 62.5 in | Wt 228.0 lb

## 2017-03-24 DIAGNOSIS — Z1212 Encounter for screening for malignant neoplasm of rectum: Secondary | ICD-10-CM

## 2017-03-24 DIAGNOSIS — R945 Abnormal results of liver function studies: Secondary | ICD-10-CM | POA: Diagnosis not present

## 2017-03-24 DIAGNOSIS — Z1211 Encounter for screening for malignant neoplasm of colon: Secondary | ICD-10-CM

## 2017-03-24 DIAGNOSIS — G4733 Obstructive sleep apnea (adult) (pediatric): Secondary | ICD-10-CM | POA: Diagnosis not present

## 2017-03-24 DIAGNOSIS — N186 End stage renal disease: Secondary | ICD-10-CM | POA: Diagnosis not present

## 2017-03-24 MED ORDER — SODIUM CHLORIDE 0.9 % IV SOLN: 500.0000 mL | INTRAVENOUS | Status: DC

## 2017-03-24 MED ORDER — INSULIN REGULAR HUMAN 100 UNIT/ML IJ SOLN
20.0000 [IU] | Freq: Once | INTRAMUSCULAR | Status: AC
  Administered 2017-03-24: 20 [IU] via SUBCUTANEOUS

## 2017-03-24 NOTE — Progress Notes (Signed)
Pt's blood sugar 416 now.  Dr. Henrene Pastor is aware.  Pt and husband state they will call her family doctor about her high blood sugars

## 2017-03-24 NOTE — Patient Instructions (Signed)
YOU HAD AN ENDOSCOPIC PROCEDURE TODAY AT [facility]:   Refer to the procedure report that was given to you for any specific questions about what was found during the examination.  If the procedure report does not answer your questions, please call your gastroenterologist to clarify.  If you requested that your care partner not be given the details of your procedure findings, then the procedure report has been included in a sealed envelope for you to review at your convenience later.  YOU SHOULD EXPECT: Some feelings of bloating in the abdomen. Passage of more gas than usual.  Walking can help get rid of the air that was put into your GI tract during the procedure and reduce the bloating. If you had a lower endoscopy (such as a colonoscopy or flexible sigmoidoscopy) you may notice spotting of blood in your stool or on the toilet paper. If you underwent a bowel prep for your procedure, you may not have a normal bowel movement for a few days.  Please Note:  You might notice some irritation and congestion in your nose or some drainage.  This is from the oxygen used during your procedure.  There is no need for concern and it should clear up in a day or so.  SYMPTOMS TO REPORT IMMEDIATELY:   Following lower endoscopy (colonoscopy or flexible sigmoidoscopy):  Excessive amounts of blood in the stool  Significant tenderness or worsening of abdominal pains  Swelling of the abdomen that is new, acute  Fever of 100F or higher  For urgent or emergent issues, a gastroenterologist can be reached at any hour by calling [phone].   DIET:  We do recommend a small meal at first, but then you may proceed to your regular diet.  Drink plenty of fluids but you should avoid alcoholic beverages for 24 hours.  ACTIVITY:  You should plan to take it easy for the rest of today and you should NOT DRIVE or use heavy machinery until tomorrow (because of the sedation medicines used during the test).     FOLLOW UP: Our staff will call the number listed on your records the next business day following your procedure to check on you and address any questions or concerns that you may have regarding the information given to you following your procedure. If we do not reach you, we will leave a message.  However, if you are feeling well and you are not experiencing any problems, there is no need to return our call.  We will assume that you have returned to your regular daily activities without incident.  SIGNATURES/CONFIDENTIALITY: You and/or your care partner have signed paperwork which will be entered into your electronic medical record.  These signatures attest to the fact that that the information above on your After Visit Summary has been reviewed and is understood.  Full responsibility of the confidentiality of this discharge information lies with you and/or your care-partner.  Next colonoscopy- 10 years  Please call to set up follow up appointment with Dr. Henrene Pastor in 1 year  Continue your normal medications

## 2017-03-24 NOTE — Progress Notes (Signed)
Report to PACU, RN, vss, BBS= Clear.  

## 2017-03-24 NOTE — Op Note (Signed)
[place] Patient Name: Cassandra Campos Procedure Date: 03/24/2017 8:39 AM MRN: [id number] Endoscopist: Docia Chuck. Henrene Pastor , MD Age: 46 Referring MD:  Date of Birth: 01/24/1971 Gender: Female Account #: [id number] Procedure:                Colonoscopy Indications:              Screening for colorectal malignant neoplasm Medicines:                Monitored Anesthesia Care Procedure:                Pre-Anesthesia Assessment:                           - Prior to the procedure, a History and Physical                            was performed, and patient medications and                            allergies were reviewed. The patient's tolerance of                            previous anesthesia was also reviewed. The risks                            and benefits of the procedure and the sedation                            options and risks were discussed with the patient.                            All questions were answered, and informed consent                            was obtained. Prior Anticoagulants: The patient has                            taken no previous anticoagulant or antiplatelet                            agents. ASA Grade Assessment: II - A patient with                            mild systemic disease. After reviewing the risks                            and benefits, the patient was deemed in                            satisfactory condition to undergo the procedure.                           After obtaining informed consent, the colonoscope  was passed under direct vision. Throughout the                            procedure, the patient's blood pressure, pulse, and                            oxygen saturations were monitored continuously. The                            Colonoscope was introduced through the anus and                            advanced to the the cecum, identified by                            appendiceal orifice and  ileocecal valve. The                            ileocecal valve, appendiceal orifice, and rectum                            were photographed. The quality of the bowel                            preparation was excellent. The colonoscopy was                            performed without difficulty. The patient tolerated                            the procedure well. The bowel preparation used was                            SUPREP. Scope In: 8:45:17 AM Scope Out: 8:59:08 AM Scope Withdrawal Time: 0 hours 10 minutes 53 seconds  Total Procedure Duration: 0 hours 13 minutes 51 seconds  Findings:                 The entire examined colon appeared normal on direct                            and retroflexion views. Complications:            No immediate complications. Estimated blood loss:                            None. Estimated Blood Loss:     Estimated blood loss: none. Impression:               - The entire examined colon is normal on direct and                            retroflexion views.                           - No specimens collected. Recommendation:           -  Repeat colonoscopy in 10 years for screening                            purposes.                           - Patient has a contact number available for                            emergencies. The signs and symptoms of potential                            delayed complications were discussed with the                            patient. Return to normal activities tomorrow.                            Written discharge instructions were provided to the                            patient.                           - Resume previous diet.                           - Continue present medications.                           ***EXERCISE AND WEIGHT LOSS FOR FATTY LIVER***                           - GI office follow-up one year Adante Courington N. Henrene Pastor, MD 03/24/2017 9:04:23 AM This report has been signed electronically.

## 2017-03-25 ENCOUNTER — Telehealth: Payer: Self-pay

## 2017-03-25 NOTE — Telephone Encounter (Signed)
  Follow up Call-  Call Cassandra Campos number 03/24/2017  Post procedure Call [name] phone  # [phone]  Permission to leave phone message Yes  Some recent data might be hidden     Patient questions:  Do you have a fever, pain , or abdominal swelling? No. Pain Score  0 *  Have you tolerated food without any problems? Yes.    Have you been able to return to your normal activities? Yes.    Do you have any questions about your discharge instructions: Diet   No. Medications  No. Follow up visit  No.  Do you have questions or concerns about your Care? No.  Actions: * If pain score is 4 or above: No action needed, pain <4.

## 2017-04-14 ENCOUNTER — Encounter: Payer: Self-pay | Admitting: Vascular Surgery

## 2017-04-14 ENCOUNTER — Other Ambulatory Visit: Payer: Self-pay

## 2017-04-14 ENCOUNTER — Ambulatory Visit (HOSPITAL_COMMUNITY)
Admission: RE | Admit: 2017-04-14 | Discharge: 2017-04-14 | Disposition: A | Discharge status: Home / Self Care | Payer: Medicaid Other | Source: Ambulatory Visit | Attending: Vascular Surgery | Admitting: Vascular Surgery

## 2017-04-14 ENCOUNTER — Ambulatory Visit (INDEPENDENT_AMBULATORY_CARE_PROVIDER_SITE_OTHER): Payer: Self-pay | Admitting: Vascular Surgery

## 2017-04-14 VITALS — BP 129/80 | HR 92 | Temp 97.7°F | Resp 16 | Ht 63.5 in | Wt 232.0 lb

## 2017-04-14 DIAGNOSIS — N184 Chronic kidney disease, stage 4 (severe): Secondary | ICD-10-CM

## 2017-04-14 DIAGNOSIS — I771 Stricture of artery: Secondary | ICD-10-CM | POA: Insufficient documentation

## 2017-04-14 DIAGNOSIS — Z48812 Encounter for surgical aftercare following surgery on the circulatory system: Secondary | ICD-10-CM

## 2017-04-14 NOTE — Progress Notes (Signed)
Patient is a 46 year old female who returns for postoperative follow-up after recent revision of her left brachiocephalic AV fistula 70/17/7939. The fistula was originally placed and then superficialized in 2017. She is currently not on hemodialysis. She has some occasional numbness and tingling in her hand but this is not really bothersome to her. She has no weakness.  Past Surgical History:  Procedure Laterality Date  . ABDOMINAL HYSTERECTOMY  FEB 2002   TAH/BSO for irregular bleeding and leiomyoma  . AV FISTULA PLACEMENT Left 07/04/2015   Procedure: ARTERIOVENOUS (AV) FISTULA CREATION-LEFT;  Surgeon: Mal Misty, MD;  Location: [facility];  Service: Vascular;  Laterality: Left;  . CARPAL TUNNEL RELEASE     X 2  . CESAREAN SECTION     X 2  . EYE SURGERY Bilateral    Retina reattachment  . FISTULA SUPERFICIALIZATION Left 08/28/2015   Procedure: BRACHIOCEPHALIC FISTULA SUPERFICIALIZATION;  Surgeon: Mal Misty, MD;  Location: [facility];  Service: Vascular;  Laterality: Left;  . OOPHORECTOMY     BSO  . PATCH ANGIOPLASTY Left 02/14/2017   Procedure: PATCH ANGIOPLASTY;  Surgeon: Elam Dutch, MD;  Location: [facility];  Service: Vascular;  Laterality: Left;  . PELVIC LAPAROSCOPY    . REVISON OF ARTERIOVENOUS FISTULA Left 02/14/2017   Procedure: REVISON OF ARTERIOVENOUS FISTULA  LEFT ARM;  Surgeon: Elam Dutch, MD;  [facility];  Service: Vascular;  Laterality: Left;     Physical exam:  Vitals:   04/14/17 0853  BP: 129/80  Pulse: 92  Resp: 16  Temp: 97.7 F (36.5 C)  TempSrc: Oral  SpO2: 100%  Weight: 232 lb (105.2 kg)  Height: 5' 3.5" (1.613 m)    Extremities: Left upper extremity well-healed incision palpable thrill in fistula audible bruit 2+ left radial pulse  Data: Patient had a fistula duplex exam today which shows the fistula diameter is 7-13 mm. There was no significant narrowing.  Assessment: Left arm AV fistula with mild steal symptoms but tolerable to the  patient. The fistula should be ready for use in January 2019 if she requires it. I discussed with the patient today signs and symptoms of worsening steal and she will return for follow-up if she has any of these or difficulty cannulating her fistula. Otherwise she will follow-up on as-needed basis.  Ruta Hinds, MD [facility] Office: [phone] Pager: [phone]

## 2017-04-26 DIAGNOSIS — G4700 Insomnia, unspecified: Secondary | ICD-10-CM | POA: Diagnosis not present

## 2017-04-26 DIAGNOSIS — G4733 Obstructive sleep apnea (adult) (pediatric): Secondary | ICD-10-CM | POA: Diagnosis not present

## 2017-04-28 DIAGNOSIS — N189 Chronic kidney disease, unspecified: Secondary | ICD-10-CM | POA: Diagnosis not present

## 2017-04-28 DIAGNOSIS — I129 Hypertensive chronic kidney disease with stage 1 through stage 4 chronic kidney disease, or unspecified chronic kidney disease: Secondary | ICD-10-CM | POA: Diagnosis not present

## 2017-04-28 DIAGNOSIS — D631 Anemia in chronic kidney disease: Secondary | ICD-10-CM | POA: Diagnosis not present

## 2017-04-28 DIAGNOSIS — N2581 Secondary hyperparathyroidism of renal origin: Secondary | ICD-10-CM | POA: Diagnosis not present

## 2017-04-28 DIAGNOSIS — N184 Chronic kidney disease, stage 4 (severe): Secondary | ICD-10-CM | POA: Diagnosis not present

## 2017-05-13 ENCOUNTER — Telehealth: Payer: Self-pay | Admitting: *Deleted

## 2017-05-13 MED ORDER — FLUCONAZOLE 150 MG PO TABS: 150.0000 mg | ORAL_TABLET | Freq: Once | ORAL | 0 refills | Status: AC

## 2017-05-13 NOTE — Telephone Encounter (Signed)
Patient informed, Rx sent.  

## 2017-05-13 NOTE — Telephone Encounter (Signed)
Patient called c/o yeast infection internal vaginal itching only, asked if rx can be sent? Please advise

## 2017-05-13 NOTE — Telephone Encounter (Signed)
Diflucan 150 mg x1 dose.  Patient overdue for annual exam

## 2017-05-30 DIAGNOSIS — N184 Chronic kidney disease, stage 4 (severe): Secondary | ICD-10-CM | POA: Diagnosis not present

## 2017-05-30 DIAGNOSIS — E1140 Type 2 diabetes mellitus with diabetic neuropathy, unspecified: Secondary | ICD-10-CM | POA: Diagnosis not present

## 2017-05-30 DIAGNOSIS — E1165 Type 2 diabetes mellitus with hyperglycemia: Secondary | ICD-10-CM | POA: Diagnosis not present

## 2017-05-30 DIAGNOSIS — Z794 Long term (current) use of insulin: Secondary | ICD-10-CM | POA: Diagnosis not present

## 2017-05-31 DIAGNOSIS — E113512 Type 2 diabetes mellitus with proliferative diabetic retinopathy with macular edema, left eye: Secondary | ICD-10-CM | POA: Diagnosis not present

## 2017-06-07 ENCOUNTER — Ambulatory Visit: Payer: Medicaid Other | Admitting: Adult Health

## 2017-06-14 DIAGNOSIS — F4322 Adjustment disorder with anxiety: Secondary | ICD-10-CM | POA: Diagnosis not present

## 2017-06-17 DIAGNOSIS — R87610 Atypical squamous cells of undetermined significance on cytologic smear of cervix (ASC-US): Secondary | ICD-10-CM

## 2017-06-17 HISTORY — DX: Atypical squamous cells of undetermined significance on cytologic smear of cervix (ASC-US): R87.610

## 2017-06-21 DIAGNOSIS — I129 Hypertensive chronic kidney disease with stage 1 through stage 4 chronic kidney disease, or unspecified chronic kidney disease: Secondary | ICD-10-CM | POA: Diagnosis not present

## 2017-06-21 DIAGNOSIS — M109 Gout, unspecified: Secondary | ICD-10-CM | POA: Diagnosis not present

## 2017-06-21 DIAGNOSIS — E785 Hyperlipidemia, unspecified: Secondary | ICD-10-CM | POA: Diagnosis not present

## 2017-06-21 DIAGNOSIS — N184 Chronic kidney disease, stage 4 (severe): Secondary | ICD-10-CM | POA: Diagnosis not present

## 2017-07-04 DIAGNOSIS — E113512 Type 2 diabetes mellitus with proliferative diabetic retinopathy with macular edema, left eye: Secondary | ICD-10-CM | POA: Diagnosis not present

## 2017-07-05 DIAGNOSIS — N184 Chronic kidney disease, stage 4 (severe): Secondary | ICD-10-CM | POA: Diagnosis not present

## 2017-07-05 DIAGNOSIS — I129 Hypertensive chronic kidney disease with stage 1 through stage 4 chronic kidney disease, or unspecified chronic kidney disease: Secondary | ICD-10-CM | POA: Diagnosis not present

## 2017-07-05 DIAGNOSIS — N2581 Secondary hyperparathyroidism of renal origin: Secondary | ICD-10-CM | POA: Diagnosis not present

## 2017-07-05 DIAGNOSIS — N189 Chronic kidney disease, unspecified: Secondary | ICD-10-CM | POA: Diagnosis not present

## 2017-07-05 DIAGNOSIS — D631 Anemia in chronic kidney disease: Secondary | ICD-10-CM | POA: Diagnosis not present

## 2017-07-11 ENCOUNTER — Ambulatory Visit (INDEPENDENT_AMBULATORY_CARE_PROVIDER_SITE_OTHER): Payer: BLUE CROSS/BLUE SHIELD | Admitting: Gynecology

## 2017-07-11 ENCOUNTER — Encounter: Payer: Self-pay | Admitting: Gynecology

## 2017-07-11 VITALS — BP 122/82 | Ht 63.0 in | Wt 241.0 lb

## 2017-07-11 DIAGNOSIS — L292 Pruritus vulvae: Secondary | ICD-10-CM | POA: Diagnosis not present

## 2017-07-11 DIAGNOSIS — N951 Menopausal and female climacteric states: Secondary | ICD-10-CM | POA: Diagnosis not present

## 2017-07-11 DIAGNOSIS — Z01419 Encounter for gynecological examination (general) (routine) without abnormal findings: Secondary | ICD-10-CM | POA: Diagnosis not present

## 2017-07-11 DIAGNOSIS — Z1272 Encounter for screening for malignant neoplasm of vagina: Secondary | ICD-10-CM

## 2017-07-11 DIAGNOSIS — R87620 Atypical squamous cells of undetermined significance on cytologic smear of vagina (ASC-US): Secondary | ICD-10-CM | POA: Diagnosis not present

## 2017-07-11 MED ORDER — NYSTATIN-TRIAMCINOLONE 100000-0.1 UNIT/GM-% EX CREA: 1.0000 "application " | TOPICAL_CREAM | Freq: Two times a day (BID) | CUTANEOUS | 2 refills | Status: DC

## 2017-07-11 NOTE — Patient Instructions (Signed)
Apply the cream externally to the irritated areas as needed twice daily.  Follow-up in 1 year for annual exam, sooner if any issues.

## 2017-07-11 NOTE — Progress Notes (Signed)
    Cassandra Campos February 05, 1971 [id number]        47 y.o.  [id number] for breast and pelvic exam.  Notes over the past several months intermittent vulvar itching.  No significant discharge or odor.  No urinary symptoms such as frequency dysuria urgency low back pain fever or chills.  Has history of significant renal disease with shunt but has not started dialysis yet.  Had been on HRT but we weaned her off and she is having hot flushes and sweats.  No vaginal dryness   Past medical history,surgical history, problem list, medications, allergies, family history and social history were all reviewed and documented as reviewed in the EPIC chart.  ROS:  Performed with pertinent positives and negatives included in the history, assessment and plan.   Additional significant findings : None   Exam: Caryn Bee assistant Vitals:   07/11/17 0756  BP: 122/82  Weight: 241 lb (109.3 kg)  Height: 5\' 3"  (1.6 m)   Body mass index is 42.69 kg/m.  General appearance:  Normal affect, orientation and appearance. Skin: Grossly normal HEENT: Without gross lesions.  No cervical or supraclavicular adenopathy. Thyroid normal.  Lungs:  Clear without wheezing, rales or rhonchi Cardiac: RR, without RMG Abdominal:  Soft, nontender, without masses, guarding, rebound, organomegaly or hernia.  Some fascial weakness upper right of midline.  Stable and present for years. Breasts:  Examined lying and sitting without masses, retractions, discharge or axillary adenopathy. Pelvic:  Ext, BUS, Vagina: Normal.  Pap smear of vaginal cuff done.  No external lesions/irritative changes  Adnexa: Without masses or tenderness    Anus and perineum: Normal   Rectovaginal: Normal sphincter tone without palpated masses or tenderness.    Assessment/Plan:  47 y.o. [id number] female for annual gynecologic exam status post TAH subsequent BSO for cystic changes.   1. Menopausal symptoms.  Had been on Estrace 2 mg but weaned over the past  year.  Is having hot flushes and sweats.  We discussed her situation to include her medical issues and renal end-stage disease with shunt in place.  Risks of HRT to include thrombosis discussed which in her particular case be a significant issue.  Benefits from a cardiovascular and bone health also discussed.  At this point we both feel more comfortable with staying off of HRT.  Will try OTC products to see if they help. 2. Vulvar itching.  No significant discharge or other pathology.  No vulvar abnormalities on exam.  Suspect a low-grade yeast.  We will treat with Mycolog twice daily as needed.  Assuming this controls/resolves her symptoms and she will follow.  If persists she will represent for further evaluation. 3. Mammography 01/2017.  Continue with annual mammography when due.  Breast exam normal today. 4. Pap smear 2016.  Pap smear of vaginal cuff done today.  No history of significant abnormal Pap smears.  Options to stop screening per current screening guidelines based on hysterectomy history reviewed.  Will readdress on an annual basis. 5. Colonoscopy 2018.  Repeat at their recommended interval. 6. Health maintenance.  No routine lab work done as patient does this elsewhere.  Follow-up 1 year, sooner as needed.  Additional time in excess of her breast and pelvic exam was spent in direct face to face counseling and coordination of care in regards to her vulvar itching and menopausal symptoms.      Anastasio Auerbach MD, 8:25 AM 07/11/2017

## 2017-07-11 NOTE — Addendum Note (Signed)
Addended by: Nelva Nay on: 07/11/2017 09:23 AM   Modules accepted: Orders

## 2017-07-14 LAB — PAP IG W/ RFLX HPV ASCU

## 2017-07-14 LAB — HUMAN PAPILLOMAVIRUS, HIGH RISK: HPV DNA High Risk: NOT DETECTED

## 2017-07-15 ENCOUNTER — Encounter: Payer: Self-pay | Admitting: Gynecology

## 2017-08-01 DIAGNOSIS — Z961 Presence of intraocular lens: Secondary | ICD-10-CM | POA: Diagnosis not present

## 2017-08-01 DIAGNOSIS — E113511 Type 2 diabetes mellitus with proliferative diabetic retinopathy with macular edema, right eye: Secondary | ICD-10-CM | POA: Diagnosis not present

## 2017-08-01 DIAGNOSIS — E113512 Type 2 diabetes mellitus with proliferative diabetic retinopathy with macular edema, left eye: Secondary | ICD-10-CM | POA: Diagnosis not present

## 2017-08-01 DIAGNOSIS — H338 Other retinal detachments: Secondary | ICD-10-CM | POA: Diagnosis not present

## 2017-08-23 ENCOUNTER — Other Ambulatory Visit: Payer: Self-pay | Admitting: Internal Medicine

## 2017-08-23 DIAGNOSIS — N6310 Unspecified lump in the right breast, unspecified quadrant: Secondary | ICD-10-CM

## 2017-08-25 ENCOUNTER — Other Ambulatory Visit: Payer: Self-pay | Admitting: Orthopedic Surgery

## 2017-08-31 ENCOUNTER — Ambulatory Visit
Admission: RE | Admit: 2017-08-31 | Discharge: 2017-08-31 | Disposition: A | Discharge status: Home / Self Care | Payer: Medicaid Other | Source: Ambulatory Visit | Attending: Internal Medicine | Admitting: Internal Medicine

## 2017-08-31 ENCOUNTER — Ambulatory Visit
Admission: RE | Admit: 2017-08-31 | Discharge: 2017-08-31 | Disposition: A | Discharge status: Home / Self Care | Payer: BLUE CROSS/BLUE SHIELD | Source: Ambulatory Visit | Attending: Internal Medicine | Admitting: Internal Medicine

## 2017-08-31 DIAGNOSIS — N6310 Unspecified lump in the right breast, unspecified quadrant: Secondary | ICD-10-CM

## 2017-08-31 DIAGNOSIS — N6489 Other specified disorders of breast: Secondary | ICD-10-CM | POA: Diagnosis not present

## 2017-08-31 DIAGNOSIS — R922 Inconclusive mammogram: Secondary | ICD-10-CM | POA: Diagnosis not present

## 2017-09-05 ENCOUNTER — Other Ambulatory Visit: Payer: Self-pay

## 2017-09-05 ENCOUNTER — Encounter (HOSPITAL_BASED_OUTPATIENT_CLINIC_OR_DEPARTMENT_OTHER): Payer: Self-pay | Admitting: *Deleted

## 2017-09-05 DIAGNOSIS — E113393 Type 2 diabetes mellitus with moderate nonproliferative diabetic retinopathy without macular edema, bilateral: Secondary | ICD-10-CM | POA: Diagnosis not present

## 2017-09-05 DIAGNOSIS — H26491 Other secondary cataract, right eye: Secondary | ICD-10-CM | POA: Diagnosis not present

## 2017-09-05 DIAGNOSIS — H10413 Chronic giant papillary conjunctivitis, bilateral: Secondary | ICD-10-CM | POA: Diagnosis not present

## 2017-09-05 DIAGNOSIS — Z961 Presence of intraocular lens: Secondary | ICD-10-CM | POA: Diagnosis not present

## 2017-09-05 NOTE — Progress Notes (Signed)
SPOKE W/ PT VIA PHONE FOR PRE-OP INTERVIEW.  NPO AFTER MN W/ EXCEPTION CLEAR LIQUIDS UNTIL 0900 (NO CREAM [employer] PRODUCTS).  ARRIVE AT 1300.  NEEDS ISTAT 8 AND EKG.  WILL TAKE LYRICA, NORVASC, AND ALLOPURINOL AM DOS W/ SIPS OF WATER.  PT VERBALIZED UNDERSTANDING NO INSULIN AM DOS AND DO REGULAR DOSE NIGHT BEFORE SINCE FASTING AM CBG'S IS HIGH 200'S TO 300.

## 2017-09-08 DIAGNOSIS — N2581 Secondary hyperparathyroidism of renal origin: Secondary | ICD-10-CM | POA: Diagnosis not present

## 2017-09-08 DIAGNOSIS — N189 Chronic kidney disease, unspecified: Secondary | ICD-10-CM | POA: Diagnosis not present

## 2017-09-08 DIAGNOSIS — D631 Anemia in chronic kidney disease: Secondary | ICD-10-CM | POA: Diagnosis not present

## 2017-09-08 DIAGNOSIS — I129 Hypertensive chronic kidney disease with stage 1 through stage 4 chronic kidney disease, or unspecified chronic kidney disease: Secondary | ICD-10-CM | POA: Diagnosis not present

## 2017-09-08 DIAGNOSIS — N184 Chronic kidney disease, stage 4 (severe): Secondary | ICD-10-CM | POA: Diagnosis not present

## 2017-09-09 ENCOUNTER — Ambulatory Visit (HOSPITAL_BASED_OUTPATIENT_CLINIC_OR_DEPARTMENT_OTHER)
Admission: RE | Admit: 2017-09-09 | Discharge: 2017-09-09 | Disposition: A | Discharge status: Home / Self Care | Payer: BLUE CROSS/BLUE SHIELD | Source: Ambulatory Visit | Attending: Orthopedic Surgery | Admitting: Orthopedic Surgery

## 2017-09-09 ENCOUNTER — Ambulatory Visit (HOSPITAL_BASED_OUTPATIENT_CLINIC_OR_DEPARTMENT_OTHER): Payer: BLUE CROSS/BLUE SHIELD | Admitting: Certified Registered"

## 2017-09-09 ENCOUNTER — Encounter (HOSPITAL_BASED_OUTPATIENT_CLINIC_OR_DEPARTMENT_OTHER): Payer: Self-pay

## 2017-09-09 ENCOUNTER — Encounter (HOSPITAL_BASED_OUTPATIENT_CLINIC_OR_DEPARTMENT_OTHER)
Admission: RE | Disposition: A | Discharge status: Home / Self Care | Payer: Self-pay | Source: Ambulatory Visit | Attending: Orthopedic Surgery

## 2017-09-09 DIAGNOSIS — M65311 Trigger thumb, right thumb: Secondary | ICD-10-CM | POA: Diagnosis not present

## 2017-09-09 DIAGNOSIS — K760 Fatty (change of) liver, not elsewhere classified: Secondary | ICD-10-CM | POA: Insufficient documentation

## 2017-09-09 DIAGNOSIS — G4733 Obstructive sleep apnea (adult) (pediatric): Secondary | ICD-10-CM | POA: Insufficient documentation

## 2017-09-09 DIAGNOSIS — I129 Hypertensive chronic kidney disease with stage 1 through stage 4 chronic kidney disease, or unspecified chronic kidney disease: Secondary | ICD-10-CM | POA: Insufficient documentation

## 2017-09-09 DIAGNOSIS — Z6841 Body Mass Index (BMI) 40.0 and over, adult: Secondary | ICD-10-CM | POA: Insufficient documentation

## 2017-09-09 DIAGNOSIS — Z794 Long term (current) use of insulin: Secondary | ICD-10-CM | POA: Insufficient documentation

## 2017-09-09 DIAGNOSIS — E782 Mixed hyperlipidemia: Secondary | ICD-10-CM | POA: Insufficient documentation

## 2017-09-09 DIAGNOSIS — N184 Chronic kidney disease, stage 4 (severe): Secondary | ICD-10-CM | POA: Diagnosis not present

## 2017-09-09 DIAGNOSIS — E1122 Type 2 diabetes mellitus with diabetic chronic kidney disease: Secondary | ICD-10-CM | POA: Insufficient documentation

## 2017-09-09 DIAGNOSIS — Z79899 Other long term (current) drug therapy: Secondary | ICD-10-CM | POA: Diagnosis not present

## 2017-09-09 HISTORY — DX: Idiopathic gout, multiple sites: M10.09

## 2017-09-09 HISTORY — DX: Chronic kidney disease, unspecified: N18.9

## 2017-09-09 HISTORY — DX: Long term (current) use of insulin: Z79.4

## 2017-09-09 HISTORY — DX: Chronic kidney disease, stage 4 (severe): N18.4

## 2017-09-09 HISTORY — DX: Other specified postprocedural states: Z98.890

## 2017-09-09 HISTORY — PX: TRIGGER FINGER RELEASE: SHX641

## 2017-09-09 HISTORY — DX: Type 2 diabetes mellitus without complications: E11.9

## 2017-09-09 HISTORY — DX: Anemia in chronic kidney disease: D63.1

## 2017-09-09 HISTORY — DX: Elevation of levels of liver transaminase levels: R74.01

## 2017-09-09 HISTORY — DX: Obstructive sleep apnea (adult) (pediatric): G47.33

## 2017-09-09 HISTORY — DX: Mixed hyperlipidemia: E78.2

## 2017-09-09 HISTORY — DX: Polyneuropathy, unspecified: G62.9

## 2017-09-09 HISTORY — DX: Trigger thumb, right thumb: M65.311

## 2017-09-09 HISTORY — DX: Nonspecific elevation of levels of transaminase and lactic acid dehydrogenase (ldh): R74.0

## 2017-09-09 LAB — POCT I-STAT, CHEM 8
BUN: 45 mg/dL — AB (ref 6–20)
CREATININE: 4.3 mg/dL — AB (ref 0.44–1.00)
Calcium, Ion: 1.2 mmol/L (ref 1.15–1.40)
Chloride: 92 mmol/L — ABNORMAL LOW (ref 101–111)
Glucose, Bld: 290 mg/dL — ABNORMAL HIGH (ref 65–99)
HEMATOCRIT: 36 % (ref 36.0–46.0)
HEMOGLOBIN: 12.2 g/dL (ref 12.0–15.0)
POTASSIUM: 3.2 mmol/L — AB (ref 3.5–5.1)
SODIUM: 137 mmol/L (ref 135–145)
TCO2: 33 mmol/L — ABNORMAL HIGH (ref 22–32)

## 2017-09-09 LAB — GLUCOSE, CAPILLARY: Glucose-Capillary: 235 mg/dL — ABNORMAL HIGH (ref 65–99)

## 2017-09-09 SURGERY — RELEASE, A1 PULLEY, FOR TRIGGER FINGER
Anesthesia: General | Site: Thumb | Laterality: Right

## 2017-09-09 MED ORDER — FENTANYL CITRATE (PF) 100 MCG/2ML IJ SOLN
25.0000 ug | INTRAMUSCULAR | Status: DC | PRN
  Administered 2017-09-09: 25 ug via INTRAVENOUS
  Filled 2017-09-09: qty 1

## 2017-09-09 MED ORDER — SODIUM CHLORIDE 0.9 % IV SOLN
INTRAVENOUS | Status: DC | PRN
  Administered 2017-09-09: 14:00:00 via INTRAVENOUS

## 2017-09-09 MED ORDER — ACETAMINOPHEN 325 MG PO TABS
325.0000 mg | ORAL_TABLET | ORAL | Status: DC | PRN
  Filled 2017-09-09: qty 2

## 2017-09-09 MED ORDER — CEFAZOLIN SODIUM-DEXTROSE 2-4 GM/100ML-% IV SOLN
2.0000 g | INTRAVENOUS | Status: AC
  Administered 2017-09-09: 2 g via INTRAVENOUS
  Filled 2017-09-09: qty 100

## 2017-09-09 MED ORDER — ONDANSETRON HCL 4 MG/2ML IJ SOLN
INTRAMUSCULAR | Status: AC
  Filled 2017-09-09: qty 2

## 2017-09-09 MED ORDER — ONDANSETRON HCL 4 MG/2ML IJ SOLN
INTRAMUSCULAR | Status: DC | PRN
  Administered 2017-09-09: 4 mg via INTRAVENOUS

## 2017-09-09 MED ORDER — FENTANYL CITRATE (PF) 100 MCG/2ML IJ SOLN
INTRAMUSCULAR | Status: DC | PRN
  Administered 2017-09-09: 50 ug via INTRAVENOUS

## 2017-09-09 MED ORDER — SODIUM CHLORIDE 0.9 % IV SOLN
INTRAVENOUS | Status: DC
  Administered 2017-09-09: 14:00:00 via INTRAVENOUS
  Filled 2017-09-09: qty 1000

## 2017-09-09 MED ORDER — ONDANSETRON HCL 4 MG/2ML IJ SOLN
4.0000 mg | Freq: Once | INTRAMUSCULAR | Status: DC | PRN
Start: 2017-09-09 — End: 2017-09-09
  Filled 2017-09-09: qty 2

## 2017-09-09 MED ORDER — FENTANYL CITRATE (PF) 100 MCG/2ML IJ SOLN
INTRAMUSCULAR | Status: AC
Start: 2017-09-09 — End: ?
  Filled 2017-09-09: qty 2

## 2017-09-09 MED ORDER — MIDAZOLAM HCL 2 MG/2ML IJ SOLN
INTRAMUSCULAR | Status: AC
Start: 2017-09-09 — End: ?
  Filled 2017-09-09: qty 2

## 2017-09-09 MED ORDER — CHLORHEXIDINE GLUCONATE 4 % EX LIQD
60.0000 mL | Freq: Once | CUTANEOUS | Status: DC
  Filled 2017-09-09: qty 118

## 2017-09-09 MED ORDER — LABETALOL HCL 5 MG/ML IV SOLN
INTRAVENOUS | Status: AC
  Filled 2017-09-09: qty 4

## 2017-09-09 MED ORDER — MEPERIDINE HCL 25 MG/ML IJ SOLN
6.2500 mg | INTRAMUSCULAR | Status: DC | PRN
  Filled 2017-09-09: qty 1

## 2017-09-09 MED ORDER — LIDOCAINE 2% (20 MG/ML) 5 ML SYRINGE
INTRAMUSCULAR | Status: AC
  Filled 2017-09-09: qty 5

## 2017-09-09 MED ORDER — ACETAMINOPHEN 160 MG/5ML PO SOLN
325.0000 mg | ORAL | Status: DC | PRN
  Filled 2017-09-09: qty 20.3

## 2017-09-09 MED ORDER — OXYCODONE HCL 5 MG PO TABS
5.0000 mg | ORAL_TABLET | Freq: Once | ORAL | Status: DC | PRN
  Filled 2017-09-09: qty 1

## 2017-09-09 MED ORDER — LABETALOL HCL 5 MG/ML IV SOLN
10.0000 mg | INTRAVENOUS | Status: DC | PRN
  Administered 2017-09-09: 10 mg via INTRAVENOUS
  Filled 2017-09-09: qty 4

## 2017-09-09 MED ORDER — CEFAZOLIN SODIUM 1 G IJ SOLR
INTRAMUSCULAR | Status: AC
  Filled 2017-09-09: qty 20

## 2017-09-09 MED ORDER — PROPOFOL 10 MG/ML IV BOLUS
INTRAVENOUS | Status: DC | PRN
  Administered 2017-09-09: 160 mg via INTRAVENOUS

## 2017-09-09 MED ORDER — LIDOCAINE 2% (20 MG/ML) 5 ML SYRINGE
INTRAMUSCULAR | Status: DC | PRN
  Administered 2017-09-09: 60 mg via INTRAVENOUS

## 2017-09-09 MED ORDER — OXYCODONE HCL 5 MG/5ML PO SOLN
5.0000 mg | Freq: Once | ORAL | Status: DC | PRN
  Filled 2017-09-09: qty 5

## 2017-09-09 MED ORDER — BUPIVACAINE HCL (PF) 0.5 % IJ SOLN
INTRAMUSCULAR | Status: DC | PRN
  Administered 2017-09-09: 5 mL

## 2017-09-09 MED ORDER — FENTANYL CITRATE (PF) 100 MCG/2ML IJ SOLN
INTRAMUSCULAR | Status: AC
  Filled 2017-09-09: qty 2

## 2017-09-09 SURGICAL SUPPLY — 37 items
BANDAGE ACE 3X5.8 VEL STRL LF (GAUZE/BANDAGES/DRESSINGS) ×2 IMPLANT
BLADE SURG 11 STRL SS (BLADE) IMPLANT
BLADE SURG 15 STRL LF DISP TIS (BLADE) ×1 IMPLANT
BLADE SURG 15 STRL SS (BLADE) ×1
BNDG ELASTIC 2X5.8 VLCR STR LF (GAUZE/BANDAGES/DRESSINGS) ×2 IMPLANT
BNDG ESMARK 4X9 LF (GAUZE/BANDAGES/DRESSINGS) IMPLANT
COVER BACK TABLE 60X90IN (DRAPES) ×2 IMPLANT
COVER MAYO STAND STRL (DRAPES) ×2 IMPLANT
CUFF TOURNIQUET SINGLE 18IN (TOURNIQUET CUFF) IMPLANT
DECANTER SPIKE VIAL GLASS SM (MISCELLANEOUS) ×2 IMPLANT
DRAPE EXTREMITY T 121X128X90 (DRAPE) ×2 IMPLANT
DRSG EMULSION OIL 3X3 NADH (GAUZE/BANDAGES/DRESSINGS) ×2 IMPLANT
DURAPREP 26ML APPLICATOR (WOUND CARE) ×2 IMPLANT
ELECT NEEDLE TIP 2.8 STRL (NEEDLE) IMPLANT
ELECT REM PT RETURN 9FT ADLT (ELECTROSURGICAL)
ELECTRODE REM PT RTRN 9FT ADLT (ELECTROSURGICAL) IMPLANT
GAUZE SPONGE 4X4 12PLY STRL (GAUZE/BANDAGES/DRESSINGS) ×2 IMPLANT
GAUZE SPONGE 4X4 12PLY STRL LF (GAUZE/BANDAGES/DRESSINGS) ×2 IMPLANT
GLOVE BIOGEL PI IND STRL 8 (GLOVE) ×2 IMPLANT
GLOVE BIOGEL PI INDICATOR 8 (GLOVE) ×2
GLOVE ECLIPSE 7.5 STRL STRAW (GLOVE) ×4 IMPLANT
GOWN STRL REUS W/TWL XL LVL3 (GOWN DISPOSABLE) ×2 IMPLANT
NEEDLE HYPO 25X1 1.5 SAFETY (NEEDLE) ×2 IMPLANT
PACK BASIN DAY SURGERY FS (CUSTOM PROCEDURE TRAY) ×2 IMPLANT
PAD CAST 3X4 CTTN HI CHSV (CAST SUPPLIES) ×1 IMPLANT
PADDING CAST ABS 4INX4YD NS (CAST SUPPLIES) ×1
PADDING CAST ABS COTTON 4X4 ST (CAST SUPPLIES) ×1 IMPLANT
PADDING CAST COTTON 3X4 STRL (CAST SUPPLIES) ×1
PADDING UNDERCAST 2 STRL (CAST SUPPLIES) ×1
PADDING UNDERCAST 2X4 STRL (CAST SUPPLIES) ×1 IMPLANT
PENCIL BUTTON HOLSTER BLD 10FT (ELECTRODE) IMPLANT
STOCKINETTE 4X48 STRL (DRAPES) ×2 IMPLANT
SUT ETHILON 4 0 PS 2 18 (SUTURE) ×2 IMPLANT
SYR BULB 3OZ (MISCELLANEOUS) ×2 IMPLANT
SYR CONTROL 10ML LL (SYRINGE) ×2 IMPLANT
TOWEL OR 17X24 6PK STRL BLUE (TOWEL DISPOSABLE) ×4 IMPLANT
UNDERPAD 30X30 (UNDERPADS AND DIAPERS) ×2 IMPLANT

## 2017-09-09 NOTE — Anesthesia Preprocedure Evaluation (Addendum)
Anesthesia Evaluation  Patient identified by MRN, date of birth, ID band Patient awake    Reviewed: Allergy & Precautions, NPO status , Patient's Chart, lab work & pertinent test results  Airway Mallampati: III  TM Distance: >3 FB Neck ROM: Full    Dental  (+) Teeth Intact, Dental Advisory Given   Pulmonary neg pulmonary ROS, sleep apnea and Continuous Positive Airway Pressure Ventilation ,    Pulmonary exam normal breath sounds clear to auscultation       Cardiovascular Exercise Tolerance: Poor hypertension, Pt. on medications (-) angina(-) CAD and (-) Past MI Normal cardiovascular exam Rhythm:Regular Rate:Normal     Neuro/Psych Neuropathy  negative psych ROS   GI/Hepatic negative GI ROS, Neg liver ROS,   Endo/Other  diabetes, Type 2, Insulin DependentMorbid obesity  Renal/GU Renal Insufficiency and ESRFRenal diseaseRI STAGE IV     Musculoskeletal negative musculoskeletal ROS (+)   Abdominal   Peds  Hematology  (+) Blood dyscrasia, anemia ,   Anesthesia Other Findings   Reproductive/Obstetrics negative OB ROS                            Anesthesia Physical  Anesthesia Plan  ASA: III  Anesthesia Plan: General   Post-op Pain Management:    Induction: Intravenous  PONV Risk Score and Plan: 2 and Ondansetron, Dexamethasone and Treatment may vary due to age or medical condition  Airway Management Planned: Mask and LMA  Additional Equipment:   Intra-op Plan:   Post-operative Plan: Extubation in OR  Informed Consent: I have reviewed the patients History and Physical, chart, labs and discussed the procedure including the risks, benefits and alternatives for the proposed anesthesia with the patient or authorized representative who has indicated his/her understanding and acceptance.   Dental Advisory Given and Dental advisory given  Plan Discussed with: Anesthesiologist, CRNA and  Surgeon  Anesthesia Plan Comments:        Anesthesia Quick Evaluation

## 2017-09-09 NOTE — Anesthesia Procedure Notes (Signed)
Procedure Name: LMA Insertion Date/Time: 09/09/2017 3:28 PM Performed by: Suan Halter, CRNA Pre-anesthesia Checklist: Patient identified, Emergency Drugs available, Suction available and Patient being monitored Patient Re-evaluated:Patient Re-evaluated prior to induction Oxygen Delivery Method: Circle system utilized Preoxygenation: Pre-oxygenation with 100% oxygen Induction Type: IV induction Ventilation: Mask ventilation without difficulty LMA: LMA inserted LMA Size: 4.0 Number of attempts: 1 Airway Equipment and Method: Bite block Placement Confirmation: positive ETCO2 Tube secured with: Tape Dental Injury: Teeth and Oropharynx as per pre-operative assessment

## 2017-09-09 NOTE — H&P (Signed)
PREOPERATIVE H&P  Chief Complaint: Catching and locking right thumb  HPI: Cassandra Campos is a 47 y.o. female who presents for evaluation of catching and locking right thumb. It has been present for rater than 3 months and has been worsening. She has failed conservative measures. Pain is rated as moderate.  Past Medical History:  Diagnosis Date  . Anemia associated with chronic renal failure   . ASCUS of cervix with negative high risk HPV 06/2017  . CKD (chronic kidney disease), stage IV ([place]) no dialysis yet   nephrologist-  dr Jamal Maes-- scheduled next visit 09-08-2017 (lov 07-05-2017)  . Diabetic retinopathy ([place])   . Elevated transaminase level   . Fatty liver    FOLLOWED BY DR Henrene Pastor  . Hypertension   . Idiopathic gout of multiple sites    09-05-2017 per pt stable  . Insulin dependent type 2 diabetes mellitus ([place])    followed by dr Toy Care  . Mixed hyperlipidemia   . OSA (obstructive sleep apnea)    per study 10-20-2015 mild osa w/ AHI 10.3/hr;  09-05-2017 per pt has not used cpap since 1 yr ago  . Peripheral neuropathy    feet  . S/P arteriovenous (AV) fistula creation 07-04-2015  w/ revision 02-14-2017   left braniocephilic  . Trigger thumb, right thumb   . Umbilical hernia    Past Surgical History:  Procedure Laterality Date  . ABDOMINAL HYSTERECTOMY  07/11/2000   dr gott   TAH/BSO for irregular bleeding and leiomyoma  . AV FISTULA PLACEMENT Left 07/04/2015   Procedure: ARTERIOVENOUS (AV) FISTULA CREATION-LEFT;  Surgeon: Mal Misty, MD;  Location: [facility];  Service: Vascular;  Laterality: Left;  . CARPAL TUNNEL RELEASE Bilateral 1993 and 1994  . CESAREAN SECTION  1993:  09-25-1999   BILATERAL TUBAL LIGATION W/ LAST C/S  . DILATION AND CURETTAGE OF UTERUS    . EXPLORATORY LAPARTOMY / LYSIS ADHESIONS/  BILATERAL SALPINGOOPHORECTOMY  07-20-2011  dr s. Rhodia Albright  [facility]  . FISTULA SUPERFICIALIZATION Left 08/28/2015   Procedure: BRACHIOCEPHALIC FISTULA SUPERFICIALIZATION;   Surgeon: Mal Misty, MD;  Location: [facility];  Service: Vascular;  Laterality: Left;  . PATCH ANGIOPLASTY Left 02/14/2017   Procedure: PATCH ANGIOPLASTY;  Surgeon: Elam Dutch, MD;  Location: [facility];  Service: Vascular;  Laterality: Left;  . PELVIC LAPAROSCOPY  1994   exploration for ectopic preg.  Marland Kitchen RETINAL DETACHMENT SURGERY Left 2015  . REVISON OF ARTERIOVENOUS FISTULA Left 02/14/2017   Procedure: REVISON OF ARTERIOVENOUS FISTULA  LEFT ARM;  Surgeon: Elam Dutch, MD;  Location: [facility];  Service: Vascular;  Laterality: Left;  . TRIGGER FINGER RELEASE Left 2015   thumb   Social History   Socioeconomic History  . Marital status: Married    Spouse name: Not on file  . Number of children: 2  . Years of education: Not on file  . Highest education level: Not on file  Occupational History  . Occupation: unemployed  Social Needs  . Financial resource strain: Not on file  . Food insecurity:    Worry: Not on file    Inability: Not on file  . Transportation needs:    Medical: Not on file    Non-medical: Not on file  Tobacco Use  . Smoking status: Never Smoker  . Smokeless tobacco: Never Used  Substance and Sexual Activity  . Alcohol use: Yes    Alcohol/week: 0.0 oz    Comment: RARE  . Drug use: No  . Sexual  activity: Yes    Birth control/protection: Surgical    Comment: HYST-1st intercourse 47 yo-Fewer than 5 partners  Lifestyle  . Physical activity:    Days per week: Not on file    Minutes per session: Not on file  . Stress: Not on file  Relationships  . Social connections:    Talks on phone: Not on file    Gets together: Not on file    Attends religious service: Not on file    Active member of club or organization: Not on file    Attends meetings of clubs or organizations: Not on file    Relationship status: Not on file  Other Topics Concern  . Not on file  Social History Narrative  . Not on file   Family History  Problem Relation Age of Onset  .  Diabetes Mother   . Hypertension Mother   . Diabetes Father   . Hypertension Father   . Cancer Father        STOMACH  . Stomach cancer Father   . Stroke Maternal Grandmother   . Stroke Maternal Grandfather   . Colon cancer Neg Hx   . Rectal cancer Neg Hx    No Known Allergies Prior to Admission medications   Medication Sig Start Date End Date Taking? Authorizing Provider  allopurinol (ZYLOPRIM) 300 MG tablet Take 300 mg by mouth every morning.  05/27/16  Yes [provider]  amLODipine (NORVASC) 10 MG tablet Take 10 mg by mouth every morning.   Yes [provider]  Cholecalciferol (VITAMIN D3) 5000 UNITS CAPS Take 5,000 Units by mouth every morning.    Yes [provider]  colchicine 0.6 MG tablet Take 1 tablet (0.6 mg total) by mouth 2 (two) times daily. Take BID for acute gout attack, stop when acute pain resolves 05/14/16  Yes Newt Minion, MD  furosemide (LASIX) 80 MG tablet Take 80 mg by mouth 2 (two) times daily.    Yes [provider]  HUMULIN R 500 UNIT/ML injection INJECT 140 UNITS UNDER THE SKIN BEFORE BREAKFAST AND 100 UNITS BEFORE EVENING MEAL   THIRTY MINUTES BEFORE MEALS 12/08/16  Yes [provider]  IRON PO Take 150 mg by mouth every morning.    Yes [provider]  LYRICA 100 MG capsule Take 100 mg by mouth every morning.  12/22/16  Yes [provider]  metolazone (ZAROXOLYN) 2.5 MG tablet 1 (ONE) TABLET 4 DAYS A WEEK 01/18/17  Yes [provider]  potassium chloride (MICRO-K) 10 MEQ CR capsule Take 40-50 mEq by mouth See admin instructions. Take 5 in the morning, and 4 at night 09/19/15  Yes [provider]  pravastatin (PRAVACHOL) 40 MG tablet Take 40 mg by mouth every evening.    Yes [provider]  nystatin-triamcinolone (MYCOLOG II) cream Apply 1 application topically 2 (two) times daily. 07/11/17   Fontaine, Belinda Block, MD     Positive ROS: None  All other systems have been  reviewed and were otherwise negative with the exception of those mentioned in the HPI and as above.  Physical Exam: There were no vitals filed for this visit.  General: Alert, no acute distress Cardiovascular: No pedal edema Respiratory: No cyanosis, no use of accessory musculature GI: No organomegaly, abdomen is soft and non-tender Skin: No lesions in the area of chief complaint Neurologic: Sensation intact distally Psychiatric: Patient is competent for consent with normal mood and affect Lymphatic: No axillary or cervical lymphadenopathy  MUSCULOSKELETAL: Right thumb: Locking and catching the range of motion.  Painful range of motion.  Neurovascular intact distally.  Assessment/Plan: RIGHT TRIGGER THUMB Plan for Procedure(s): RELEASE TRIGGER FINGER/A-1 PULLEY RIGHT THUMB  The risks benefits and alternatives were discussed with the patient including but not limited to the risks of nonoperative treatment, versus surgical intervention including infection, bleeding, nerve injury, malunion, nonunion, hardware prominence, hardware failure, need for hardware removal, blood clots, cardiopulmonary complications, morbidity, mortality, among others, and they were willing to proceed.  Predicted outcome is good, although there will be at least a six to nine month expected recovery.  Alta Corning, MD 09/09/2017 10:08 AM

## 2017-09-09 NOTE — Discharge Instructions (Signed)
Keep bandage in place clean and dry. Keep arm elevated x48 hours.  Use ice as needed. Move thumb to tolerance within the dressing. Follow-up with Dr. Berenice Primas 5 days.   Post Anesthesia Home Care Instructions  Activity: Get plenty of rest for the remainder of the day. A responsible individual must stay with you for 24 hours following the procedure.  For the next 24 hours, DO NOT: -Drive a car -Paediatric nurse -Drink alcoholic beverages -Take any medication unless instructed by your physician -Make any legal decisions or sign important papers.  Meals: Start with liquid foods such as gelatin or soup. Progress to regular foods as tolerated. Avoid greasy, spicy, heavy foods. If nausea and/or vomiting occur, drink only clear liquids until the nausea and/or vomiting subsides. Call your physician if vomiting continues.  Special Instructions/Symptoms: Your throat may feel dry or sore from the anesthesia or the breathing tube placed in your throat during surgery. If this causes discomfort, gargle with warm salt water. The discomfort should disappear within 24 hours.

## 2017-09-09 NOTE — Anesthesia Postprocedure Evaluation (Signed)
Anesthesia Post Note  Patient: Cassandra Campos  Procedure(s) Performed: RELEASE TRIGGER FINGER/A-1 PULLEY RIGHT THUMB (Right Thumb)     Patient location during evaluation: PACU Anesthesia Type: General Level of consciousness: awake and alert and oriented Pain management: pain level controlled Vital Signs Assessment: post-procedure vital signs reviewed and stable Respiratory status: spontaneous breathing, nonlabored ventilation and respiratory function stable Cardiovascular status: blood pressure returned to baseline and stable Postop Assessment: no apparent nausea or vomiting Anesthetic complications: no    Last Vitals:  Vitals:   09/09/17 1645 09/09/17 1715  BP: (!) 169/92 (!) 169/89  Pulse: 94 97  Resp: 14 16  Temp:  (!) 36.3 C  SpO2: 92% 94%    Last Pain:  Vitals:   09/09/17 1715  TempSrc:   PainSc: 0-No pain                 [name] A.

## 2017-09-09 NOTE — Transfer of Care (Signed)
Immediate Anesthesia Transfer of Care Note  Patient: Cassandra Campos  Procedure(s) Performed: Procedure(s) (LRB): RELEASE TRIGGER FINGER/A-1 PULLEY RIGHT THUMB (Right)  Patient Location: PACU  Anesthesia Type: General  Level of Consciousness: awake, oriented, sedated and patient cooperative  Airway & Oxygen Therapy: Patient Spontanous Breathing and Patient connected to face mask oxygen  Post-op Assessment: Report given to PACU RN and Post -op Vital signs reviewed and stable  Post vital signs: Reviewed and stable  Complications: No apparent anesthesia complications  Last Vitals:  Vitals Value Taken Time  BP 178/107 09/09/2017  3:53 PM  Temp 36.3 C 09/09/2017  3:53 PM  Pulse 98 09/09/2017  4:00 PM  Resp 13 09/09/2017  4:00 PM  SpO2 100 % 09/09/2017  4:00 PM  Vitals shown include unvalidated device data.  Last Pain:  Vitals:   09/09/17 1325  TempSrc:   PainSc: 0-No pain      Patients Stated Pain Goal: 7 (09/09/17 1325)

## 2017-09-11 NOTE — Brief Op Note (Signed)
09/09/2017  11:00 PM  PATIENT:  Cassandra Campos  47 y.o. female  PRE-OPERATIVE DIAGNOSIS:  RIGHT TRIGGER THUMB  POST-OPERATIVE DIAGNOSIS:  RIGHT TRIGGER THUMB  PROCEDURE:  Procedure(s): RELEASE TRIGGER FINGER/A-1 PULLEY RIGHT THUMB (Right)  SURGEON:  Surgeon(s) and Role:    Dorna Leitz, MD - Primary  PHYSICIAN ASSISTANT:   ASSISTANTS: none   ANESTHESIA:   general  EBL:  1 mL   BLOOD ADMINISTERED:none  DRAINS: none   LOCAL MEDICATIONS USED:  MARCAINE     SPECIMEN:  No Specimen  DISPOSITION OF SPECIMEN:  N/A  COUNTS:  YES  TOURNIQUET:   Total Tourniquet Time Documented: Forearm (Right) - 9 minutes Total: Forearm (Right) - 9 minutes   DICTATION: .Other Dictation: Dictation Number [id number]  PLAN OF CARE: Discharge to home after PACU  PATIENT DISPOSITION:  PACU - hemodynamically stable.   Delay start of Pharmacological VTE agent (>24hrs) due to surgical blood loss or risk of bleeding: no

## 2017-09-12 ENCOUNTER — Encounter (HOSPITAL_BASED_OUTPATIENT_CLINIC_OR_DEPARTMENT_OTHER): Payer: Self-pay | Admitting: Orthopedic Surgery

## 2017-09-12 NOTE — Addendum Note (Signed)
Addendum  created 09/12/17 1034 by Wanita Chamberlain, CRNA   Charge Capture section accepted

## 2017-09-12 NOTE — Op Note (Signed)
NAMEDONALD, JACQUE NO.:  [id number]  MEDICAL RECORD NO.:  [id number]  LOCATION:                                 FACILITY:  PHYSICIAN:  Alta Corning, M.D.        DATE OF BIRTH:  DATE OF PROCEDURE:  09/09/2017 DATE OF DISCHARGE:  09/09/2017                              OPERATIVE REPORT   PREOPERATIVE DIAGNOSIS:  Trigger thumb, right.  POSTOPERATIVE DIAGNOSIS:  Trigger thumb, right.  PROCEDURE:  Right trigger thumb release.  SURGEON:  Alta Corning, M.D.  ASSISTANT:  None.  ANESTHESIA:  General.  BRIEF HISTORY:  The patient is a 47 year old female with a long history of significant complaints of left trigger thumb.  She had been treated with surgical intervention, done well.  She was having right trigger thumb that was injected and did well initially, but locking and catching returned.  After failure of conservative care, she was taken to the operating room for right trigger thumb release.  DESCRIPTION OF PROCEDURE:  The patient was taken to the operating room and after adequate anesthesia was obtained with general anesthetic, the patient was placed supine position.  The right arm was prepped and draped in usual sterile fashion.  Following this, an incision was made over the A1 pulley of the thumb, subcutaneous tissue down the level of the A1 pulley that was clearly identified and was divided that the flexor tendon could be pulled it under the wound without any tendency towards triggering.  At that point, the wound was irrigated, suctioned dry, closed in layers.  Sterile compressive dressing was applied.  The patient was taken to the recovery room, she was noted to be in satisfactory condition.  Estimated blood loss for the procedure was none.     Alta Corning, M.D.     Corliss Skains  D:  09/11/2017  T:  09/12/2017  Job:  [id number]  cc:   Alta Corning, M.D. Fax: [phone]

## 2017-09-19 DIAGNOSIS — M65311 Trigger thumb, right thumb: Secondary | ICD-10-CM | POA: Diagnosis not present

## 2017-09-19 DIAGNOSIS — E1140 Type 2 diabetes mellitus with diabetic neuropathy, unspecified: Secondary | ICD-10-CM | POA: Diagnosis not present

## 2017-09-19 DIAGNOSIS — E1165 Type 2 diabetes mellitus with hyperglycemia: Secondary | ICD-10-CM | POA: Diagnosis not present

## 2017-09-19 DIAGNOSIS — Z794 Long term (current) use of insulin: Secondary | ICD-10-CM | POA: Diagnosis not present

## 2017-09-19 DIAGNOSIS — N184 Chronic kidney disease, stage 4 (severe): Secondary | ICD-10-CM | POA: Diagnosis not present

## 2017-09-24 DIAGNOSIS — Z9889 Other specified postprocedural states: Secondary | ICD-10-CM | POA: Diagnosis not present

## 2017-10-03 DIAGNOSIS — Z794 Long term (current) use of insulin: Secondary | ICD-10-CM | POA: Diagnosis not present

## 2017-10-03 DIAGNOSIS — E1140 Type 2 diabetes mellitus with diabetic neuropathy, unspecified: Secondary | ICD-10-CM | POA: Diagnosis not present

## 2017-10-03 DIAGNOSIS — E119 Type 2 diabetes mellitus without complications: Secondary | ICD-10-CM | POA: Diagnosis not present

## 2017-10-06 DIAGNOSIS — R000 Tachycardia, unspecified: Secondary | ICD-10-CM | POA: Diagnosis not present

## 2017-10-06 DIAGNOSIS — N184 Chronic kidney disease, stage 4 (severe): Secondary | ICD-10-CM | POA: Diagnosis not present

## 2017-10-06 DIAGNOSIS — R0982 Postnasal drip: Secondary | ICD-10-CM | POA: Diagnosis not present

## 2017-10-06 DIAGNOSIS — I129 Hypertensive chronic kidney disease with stage 1 through stage 4 chronic kidney disease, or unspecified chronic kidney disease: Secondary | ICD-10-CM | POA: Diagnosis not present

## 2017-10-06 DIAGNOSIS — Z9889 Other specified postprocedural states: Secondary | ICD-10-CM | POA: Diagnosis not present

## 2017-10-20 DIAGNOSIS — N2581 Secondary hyperparathyroidism of renal origin: Secondary | ICD-10-CM | POA: Diagnosis not present

## 2017-10-20 DIAGNOSIS — N189 Chronic kidney disease, unspecified: Secondary | ICD-10-CM | POA: Diagnosis not present

## 2017-10-20 DIAGNOSIS — N184 Chronic kidney disease, stage 4 (severe): Secondary | ICD-10-CM | POA: Diagnosis not present

## 2017-11-04 DIAGNOSIS — E1165 Type 2 diabetes mellitus with hyperglycemia: Secondary | ICD-10-CM | POA: Diagnosis not present

## 2017-11-04 DIAGNOSIS — E1140 Type 2 diabetes mellitus with diabetic neuropathy, unspecified: Secondary | ICD-10-CM | POA: Diagnosis not present

## 2017-11-04 DIAGNOSIS — Z794 Long term (current) use of insulin: Secondary | ICD-10-CM | POA: Diagnosis not present

## 2017-11-04 DIAGNOSIS — N184 Chronic kidney disease, stage 4 (severe): Secondary | ICD-10-CM | POA: Diagnosis not present

## 2017-11-07 ENCOUNTER — Ambulatory Visit
Admission: RE | Admit: 2017-11-07 | Discharge: 2017-11-07 | Disposition: A | Discharge status: Home / Self Care | Payer: BLUE CROSS/BLUE SHIELD | Source: Ambulatory Visit | Attending: Internal Medicine | Admitting: Internal Medicine

## 2017-11-07 ENCOUNTER — Other Ambulatory Visit: Payer: Self-pay | Admitting: Internal Medicine

## 2017-11-07 DIAGNOSIS — R109 Unspecified abdominal pain: Secondary | ICD-10-CM

## 2017-11-07 DIAGNOSIS — Z9889 Other specified postprocedural states: Secondary | ICD-10-CM | POA: Diagnosis not present

## 2017-11-10 DIAGNOSIS — N184 Chronic kidney disease, stage 4 (severe): Secondary | ICD-10-CM | POA: Diagnosis not present

## 2017-11-10 DIAGNOSIS — I129 Hypertensive chronic kidney disease with stage 1 through stage 4 chronic kidney disease, or unspecified chronic kidney disease: Secondary | ICD-10-CM | POA: Diagnosis not present

## 2017-11-10 DIAGNOSIS — D631 Anemia in chronic kidney disease: Secondary | ICD-10-CM | POA: Diagnosis not present

## 2017-11-10 DIAGNOSIS — N2581 Secondary hyperparathyroidism of renal origin: Secondary | ICD-10-CM | POA: Diagnosis not present

## 2017-11-22 DIAGNOSIS — H01132 Eczematous dermatitis of right lower eyelid: Secondary | ICD-10-CM | POA: Diagnosis not present

## 2017-11-22 DIAGNOSIS — H16223 Keratoconjunctivitis sicca, not specified as Sjogren's, bilateral: Secondary | ICD-10-CM | POA: Diagnosis not present

## 2017-11-22 DIAGNOSIS — H01135 Eczematous dermatitis of left lower eyelid: Secondary | ICD-10-CM | POA: Diagnosis not present

## 2017-11-22 DIAGNOSIS — H01134 Eczematous dermatitis of left upper eyelid: Secondary | ICD-10-CM | POA: Diagnosis not present

## 2017-11-22 DIAGNOSIS — H11123 Conjunctival concretions, bilateral: Secondary | ICD-10-CM | POA: Diagnosis not present

## 2017-11-22 DIAGNOSIS — H01131 Eczematous dermatitis of right upper eyelid: Secondary | ICD-10-CM | POA: Diagnosis not present

## 2017-11-22 DIAGNOSIS — H0289 Other specified disorders of eyelid: Secondary | ICD-10-CM | POA: Diagnosis not present

## 2017-11-22 DIAGNOSIS — H10413 Chronic giant papillary conjunctivitis, bilateral: Secondary | ICD-10-CM | POA: Diagnosis not present

## 2017-12-19 DIAGNOSIS — N185 Chronic kidney disease, stage 5: Secondary | ICD-10-CM | POA: Diagnosis not present

## 2017-12-19 DIAGNOSIS — G629 Polyneuropathy, unspecified: Secondary | ICD-10-CM | POA: Diagnosis not present

## 2017-12-19 DIAGNOSIS — N189 Chronic kidney disease, unspecified: Secondary | ICD-10-CM | POA: Diagnosis not present

## 2017-12-19 DIAGNOSIS — I770 Arteriovenous fistula, acquired: Secondary | ICD-10-CM | POA: Diagnosis not present

## 2017-12-19 DIAGNOSIS — E785 Hyperlipidemia, unspecified: Secondary | ICD-10-CM | POA: Diagnosis not present

## 2017-12-19 DIAGNOSIS — M109 Gout, unspecified: Secondary | ICD-10-CM | POA: Diagnosis not present

## 2017-12-19 DIAGNOSIS — N2581 Secondary hyperparathyroidism of renal origin: Secondary | ICD-10-CM | POA: Diagnosis not present

## 2017-12-19 DIAGNOSIS — E1129 Type 2 diabetes mellitus with other diabetic kidney complication: Secondary | ICD-10-CM | POA: Diagnosis not present

## 2017-12-19 DIAGNOSIS — I120 Hypertensive chronic kidney disease with stage 5 chronic kidney disease or end stage renal disease: Secondary | ICD-10-CM | POA: Diagnosis not present

## 2017-12-19 DIAGNOSIS — D631 Anemia in chronic kidney disease: Secondary | ICD-10-CM | POA: Diagnosis not present

## 2017-12-19 DIAGNOSIS — R748 Abnormal levels of other serum enzymes: Secondary | ICD-10-CM | POA: Diagnosis not present

## 2017-12-22 DIAGNOSIS — I129 Hypertensive chronic kidney disease with stage 1 through stage 4 chronic kidney disease, or unspecified chronic kidney disease: Secondary | ICD-10-CM | POA: Diagnosis not present

## 2017-12-22 DIAGNOSIS — G629 Polyneuropathy, unspecified: Secondary | ICD-10-CM | POA: Diagnosis not present

## 2017-12-22 DIAGNOSIS — Z1389 Encounter for screening for other disorder: Secondary | ICD-10-CM | POA: Diagnosis not present

## 2017-12-22 DIAGNOSIS — Z0000 Encounter for general adult medical examination without abnormal findings: Secondary | ICD-10-CM | POA: Diagnosis not present

## 2017-12-22 DIAGNOSIS — N184 Chronic kidney disease, stage 4 (severe): Secondary | ICD-10-CM | POA: Diagnosis not present

## 2017-12-22 DIAGNOSIS — E1140 Type 2 diabetes mellitus with diabetic neuropathy, unspecified: Secondary | ICD-10-CM | POA: Diagnosis not present

## 2017-12-22 DIAGNOSIS — E785 Hyperlipidemia, unspecified: Secondary | ICD-10-CM | POA: Diagnosis not present

## 2018-01-03 DIAGNOSIS — R51 Headache: Secondary | ICD-10-CM | POA: Diagnosis not present

## 2018-01-03 DIAGNOSIS — N2581 Secondary hyperparathyroidism of renal origin: Secondary | ICD-10-CM | POA: Diagnosis not present

## 2018-01-03 DIAGNOSIS — N186 End stage renal disease: Secondary | ICD-10-CM | POA: Diagnosis not present

## 2018-01-05 DIAGNOSIS — R51 Headache: Secondary | ICD-10-CM | POA: Diagnosis not present

## 2018-01-05 DIAGNOSIS — N186 End stage renal disease: Secondary | ICD-10-CM | POA: Diagnosis not present

## 2018-01-05 DIAGNOSIS — N2581 Secondary hyperparathyroidism of renal origin: Secondary | ICD-10-CM | POA: Diagnosis not present

## 2018-01-07 DIAGNOSIS — R51 Headache: Secondary | ICD-10-CM | POA: Diagnosis not present

## 2018-01-07 DIAGNOSIS — N186 End stage renal disease: Secondary | ICD-10-CM | POA: Diagnosis not present

## 2018-01-07 DIAGNOSIS — N2581 Secondary hyperparathyroidism of renal origin: Secondary | ICD-10-CM | POA: Diagnosis not present

## 2018-01-10 DIAGNOSIS — N2581 Secondary hyperparathyroidism of renal origin: Secondary | ICD-10-CM | POA: Diagnosis not present

## 2018-01-10 DIAGNOSIS — N186 End stage renal disease: Secondary | ICD-10-CM | POA: Diagnosis not present

## 2018-01-12 DIAGNOSIS — N186 End stage renal disease: Secondary | ICD-10-CM | POA: Diagnosis not present

## 2018-01-12 DIAGNOSIS — N2581 Secondary hyperparathyroidism of renal origin: Secondary | ICD-10-CM | POA: Diagnosis not present

## 2018-01-14 DIAGNOSIS — N186 End stage renal disease: Secondary | ICD-10-CM | POA: Diagnosis not present

## 2018-01-14 DIAGNOSIS — N2581 Secondary hyperparathyroidism of renal origin: Secondary | ICD-10-CM | POA: Diagnosis not present

## 2018-01-14 DIAGNOSIS — Z992 Dependence on renal dialysis: Secondary | ICD-10-CM | POA: Diagnosis not present

## 2018-01-14 DIAGNOSIS — E1122 Type 2 diabetes mellitus with diabetic chronic kidney disease: Secondary | ICD-10-CM | POA: Diagnosis not present

## 2018-01-17 DIAGNOSIS — Z23 Encounter for immunization: Secondary | ICD-10-CM | POA: Diagnosis not present

## 2018-01-17 DIAGNOSIS — N186 End stage renal disease: Secondary | ICD-10-CM | POA: Diagnosis not present

## 2018-01-17 DIAGNOSIS — N2581 Secondary hyperparathyroidism of renal origin: Secondary | ICD-10-CM | POA: Diagnosis not present

## 2018-01-19 DIAGNOSIS — N2581 Secondary hyperparathyroidism of renal origin: Secondary | ICD-10-CM | POA: Diagnosis not present

## 2018-01-19 DIAGNOSIS — Z23 Encounter for immunization: Secondary | ICD-10-CM | POA: Diagnosis not present

## 2018-01-19 DIAGNOSIS — N186 End stage renal disease: Secondary | ICD-10-CM | POA: Diagnosis not present

## 2018-01-21 DIAGNOSIS — N186 End stage renal disease: Secondary | ICD-10-CM | POA: Diagnosis not present

## 2018-01-21 DIAGNOSIS — Z23 Encounter for immunization: Secondary | ICD-10-CM | POA: Diagnosis not present

## 2018-01-21 DIAGNOSIS — N2581 Secondary hyperparathyroidism of renal origin: Secondary | ICD-10-CM | POA: Diagnosis not present

## 2018-01-23 DIAGNOSIS — N186 End stage renal disease: Secondary | ICD-10-CM | POA: Diagnosis not present

## 2018-01-23 DIAGNOSIS — T82858A Stenosis of vascular prosthetic devices, implants and grafts, initial encounter: Secondary | ICD-10-CM | POA: Diagnosis not present

## 2018-01-23 DIAGNOSIS — Z992 Dependence on renal dialysis: Secondary | ICD-10-CM | POA: Diagnosis not present

## 2018-01-23 DIAGNOSIS — I871 Compression of vein: Secondary | ICD-10-CM | POA: Diagnosis not present

## 2018-01-24 DIAGNOSIS — N2581 Secondary hyperparathyroidism of renal origin: Secondary | ICD-10-CM | POA: Diagnosis not present

## 2018-01-24 DIAGNOSIS — Z23 Encounter for immunization: Secondary | ICD-10-CM | POA: Diagnosis not present

## 2018-01-24 DIAGNOSIS — N186 End stage renal disease: Secondary | ICD-10-CM | POA: Diagnosis not present

## 2018-01-26 DIAGNOSIS — N2581 Secondary hyperparathyroidism of renal origin: Secondary | ICD-10-CM | POA: Diagnosis not present

## 2018-01-26 DIAGNOSIS — N186 End stage renal disease: Secondary | ICD-10-CM | POA: Diagnosis not present

## 2018-01-26 DIAGNOSIS — Z23 Encounter for immunization: Secondary | ICD-10-CM | POA: Diagnosis not present

## 2018-01-28 DIAGNOSIS — N2581 Secondary hyperparathyroidism of renal origin: Secondary | ICD-10-CM | POA: Diagnosis not present

## 2018-01-28 DIAGNOSIS — Z23 Encounter for immunization: Secondary | ICD-10-CM | POA: Diagnosis not present

## 2018-01-28 DIAGNOSIS — N186 End stage renal disease: Secondary | ICD-10-CM | POA: Diagnosis not present

## 2018-01-31 DIAGNOSIS — N2581 Secondary hyperparathyroidism of renal origin: Secondary | ICD-10-CM | POA: Diagnosis not present

## 2018-01-31 DIAGNOSIS — N186 End stage renal disease: Secondary | ICD-10-CM | POA: Diagnosis not present

## 2018-02-02 DIAGNOSIS — N186 End stage renal disease: Secondary | ICD-10-CM | POA: Diagnosis not present

## 2018-02-02 DIAGNOSIS — N2581 Secondary hyperparathyroidism of renal origin: Secondary | ICD-10-CM | POA: Diagnosis not present

## 2018-02-04 DIAGNOSIS — N186 End stage renal disease: Secondary | ICD-10-CM | POA: Diagnosis not present

## 2018-02-04 DIAGNOSIS — N2581 Secondary hyperparathyroidism of renal origin: Secondary | ICD-10-CM | POA: Diagnosis not present

## 2018-02-07 DIAGNOSIS — N2581 Secondary hyperparathyroidism of renal origin: Secondary | ICD-10-CM | POA: Diagnosis not present

## 2018-02-07 DIAGNOSIS — N186 End stage renal disease: Secondary | ICD-10-CM | POA: Diagnosis not present

## 2018-02-09 DIAGNOSIS — N2581 Secondary hyperparathyroidism of renal origin: Secondary | ICD-10-CM | POA: Diagnosis not present

## 2018-02-09 DIAGNOSIS — N186 End stage renal disease: Secondary | ICD-10-CM | POA: Diagnosis not present

## 2018-02-11 DIAGNOSIS — N186 End stage renal disease: Secondary | ICD-10-CM | POA: Diagnosis not present

## 2018-02-11 DIAGNOSIS — N2581 Secondary hyperparathyroidism of renal origin: Secondary | ICD-10-CM | POA: Diagnosis not present

## 2018-02-13 ENCOUNTER — Ambulatory Visit (INDEPENDENT_AMBULATORY_CARE_PROVIDER_SITE_OTHER): Payer: BLUE CROSS/BLUE SHIELD | Admitting: Podiatry

## 2018-02-13 ENCOUNTER — Encounter: Payer: Self-pay | Admitting: Podiatry

## 2018-02-13 VITALS — BP 103/58 | HR 98

## 2018-02-13 DIAGNOSIS — L600 Ingrowing nail: Secondary | ICD-10-CM

## 2018-02-13 DIAGNOSIS — M21962 Unspecified acquired deformity of left lower leg: Secondary | ICD-10-CM

## 2018-02-13 DIAGNOSIS — E1122 Type 2 diabetes mellitus with diabetic chronic kidney disease: Secondary | ICD-10-CM | POA: Diagnosis not present

## 2018-02-13 DIAGNOSIS — M722 Plantar fascial fibromatosis: Secondary | ICD-10-CM

## 2018-02-13 DIAGNOSIS — N186 End stage renal disease: Secondary | ICD-10-CM | POA: Diagnosis not present

## 2018-02-13 DIAGNOSIS — Z992 Dependence on renal dialysis: Secondary | ICD-10-CM | POA: Diagnosis not present

## 2018-02-13 NOTE — Patient Instructions (Signed)
Seen for pain in right heel and ingrown nails. All nails debrided. Pain relieved. Will try cortisone injection if Night Splint fails to improve situation on right heel. Will check on Orthotic coverage.

## 2018-02-13 NOTE — Progress Notes (Signed)
SUBJECTIVE: 47 y.o. year old female presents complaining of right heel pain duration of 3 months, pain in both great toes about 4 weeks ago. Has chronic kidney disease and Diabetic.  Review of Systems  Constitutional: Negative.   HENT: Negative.  Negative for congestion.   Eyes: Negative.   Respiratory: Negative.   Cardiovascular: Negative.   Gastrointestinal: Negative.   Genitourinary: Negative.   Musculoskeletal: Negative.   Skin: Negative.      OBJECTIVE: DERMATOLOGIC EXAMINATION: Ingrown nail both great toes without infection or drainage. VASCULAR EXAMINATION OF LOWER LIMBS: Pedal pulses: All pedal pulses are palpable with normal pulsation.   NEUROLOGIC EXAMINATION OF THE LOWER LIMBS: All epicritic and tactile sensations grossly intact. Sharp and Dull discriminatory sensations at the plantar ball of hallux is intact bilateral.   MUSCULOSKELETAL EXAMINATION: Elevated first ray left. Tight Achilles tendon bilateral.  X-rays taken on both feet.  ASSESSMENT: Plantar fasciitis right heel. Ingrown nails both great toes. Mycotic nails x 10.  PLAN: Reviewed findings and available treatment options. All nails debrided. Pain relieved. Night Splint dispensed with instruction. Return for Orthotics and possible injection.

## 2018-02-14 DIAGNOSIS — N186 End stage renal disease: Secondary | ICD-10-CM | POA: Diagnosis not present

## 2018-02-14 DIAGNOSIS — N2581 Secondary hyperparathyroidism of renal origin: Secondary | ICD-10-CM | POA: Diagnosis not present

## 2018-02-16 DIAGNOSIS — N186 End stage renal disease: Secondary | ICD-10-CM | POA: Diagnosis not present

## 2018-02-16 DIAGNOSIS — N2581 Secondary hyperparathyroidism of renal origin: Secondary | ICD-10-CM | POA: Diagnosis not present

## 2018-02-17 ENCOUNTER — Other Ambulatory Visit: Payer: Self-pay | Admitting: Internal Medicine

## 2018-02-17 DIAGNOSIS — Z1231 Encounter for screening mammogram for malignant neoplasm of breast: Secondary | ICD-10-CM

## 2018-02-18 DIAGNOSIS — N186 End stage renal disease: Secondary | ICD-10-CM | POA: Diagnosis not present

## 2018-02-18 DIAGNOSIS — N2581 Secondary hyperparathyroidism of renal origin: Secondary | ICD-10-CM | POA: Diagnosis not present

## 2018-02-21 DIAGNOSIS — Z23 Encounter for immunization: Secondary | ICD-10-CM | POA: Diagnosis not present

## 2018-02-21 DIAGNOSIS — N186 End stage renal disease: Secondary | ICD-10-CM | POA: Diagnosis not present

## 2018-02-21 DIAGNOSIS — N2581 Secondary hyperparathyroidism of renal origin: Secondary | ICD-10-CM | POA: Diagnosis not present

## 2018-02-23 DIAGNOSIS — N2581 Secondary hyperparathyroidism of renal origin: Secondary | ICD-10-CM | POA: Diagnosis not present

## 2018-02-23 DIAGNOSIS — N186 End stage renal disease: Secondary | ICD-10-CM | POA: Diagnosis not present

## 2018-02-23 DIAGNOSIS — Z23 Encounter for immunization: Secondary | ICD-10-CM | POA: Diagnosis not present

## 2018-02-25 DIAGNOSIS — N186 End stage renal disease: Secondary | ICD-10-CM | POA: Diagnosis not present

## 2018-02-25 DIAGNOSIS — Z23 Encounter for immunization: Secondary | ICD-10-CM | POA: Diagnosis not present

## 2018-02-25 DIAGNOSIS — N2581 Secondary hyperparathyroidism of renal origin: Secondary | ICD-10-CM | POA: Diagnosis not present

## 2018-02-27 ENCOUNTER — Ambulatory Visit (INDEPENDENT_AMBULATORY_CARE_PROVIDER_SITE_OTHER): Payer: BLUE CROSS/BLUE SHIELD | Admitting: Podiatry

## 2018-02-27 ENCOUNTER — Encounter: Payer: Self-pay | Admitting: Podiatry

## 2018-02-27 ENCOUNTER — Encounter: Payer: Self-pay | Admitting: *Deleted

## 2018-02-27 DIAGNOSIS — M722 Plantar fascial fibromatosis: Secondary | ICD-10-CM

## 2018-02-27 DIAGNOSIS — L600 Ingrowing nail: Secondary | ICD-10-CM | POA: Diagnosis not present

## 2018-02-27 DIAGNOSIS — M79671 Pain in right foot: Secondary | ICD-10-CM | POA: Diagnosis not present

## 2018-02-27 NOTE — Patient Instructions (Signed)
Seen for painful right heel and ingrown nail on right great toe. Cortisone injection given to right heel. Ingrown nail surgery done on right great toe at medial border. Follow soaking instruction. May call the office if the area becomes more red with more drainage and pain. Return in one week for follow up and possible left big toe nail surgery.

## 2018-02-27 NOTE — Progress Notes (Signed)
SUBJECTIVE: 47 y.o. year old female presents requesting ingrown nail surgery on right great toe and Cortisone injection on right heel. Patient is using Night Splint. Last visit trimming nail relieved pain only for a few days and pain has come back. Patient is seeking surgical option on ingrown nails. Patient has not received any info on Orthotic coverage. Has chronic kidney disease, on Dialysis and Diabetic.  Review of Systems  Constitutional: Negative.   HENT: Negative.  Negative for congestion.   Eyes: Negative.   Respiratory: Negative.   Cardiovascular: Negative.   Gastrointestinal: Negative.   Genitourinary: Negative.   Musculoskeletal: Negative.   Skin: Negative.      OBJECTIVE: DERMATOLOGIC EXAMINATION: Ingrown nail both great toes at medial border with pain and without infection or drainage.  VASCULAR EXAMINATION OF LOWER LIMBS: Pedal pulses: All pedal pulses are palpable with normal pulsation.   NEUROLOGIC EXAMINATION OF THE LOWER LIMBS: All epicritic and tactile sensations grossly intact. Sharp and Dull discriminatory sensations at the plantar ball of hallux is intact bilateral.   MUSCULOSKELETAL EXAMINATION: Elevated first ray left. Tight Achilles tendon bilateral.  The last X-rays taken on 02/13/18 both feet reveal extensive calcaneal spurring, projecting distally parallel to weight bearing surface R>L. Elevated first metatarsal bones also noted.  ASSESSMENT: Plantar fasciitis right heel. Ingrown nails both great toes. Mycotic nails x 10.  PLAN: Reviewed findings and available treatment options. As per request, Right heel njected with mixture of 4 mg Dexamethasone, 4 mg Triamcinolone, and 1 cc of 0.5% Marcaine plain. Patient tolerated well without difficulty.  As per request Phenol and Alcohol Matrixectomy of right great toe medial border done under local. Local used with total 5 ml of 50/50 mixture 0.5% Marcaine plain and 1% Lidocaine with Epinephrine.  Patient tolerated the procedure well. Home care instruction and supply dispensed. Return in one week for post op care on right and possible ingrown nail surgery on left great toe.

## 2018-02-28 DIAGNOSIS — N2581 Secondary hyperparathyroidism of renal origin: Secondary | ICD-10-CM | POA: Diagnosis not present

## 2018-02-28 DIAGNOSIS — N186 End stage renal disease: Secondary | ICD-10-CM | POA: Diagnosis not present

## 2018-02-28 DIAGNOSIS — Z23 Encounter for immunization: Secondary | ICD-10-CM | POA: Diagnosis not present

## 2018-03-02 DIAGNOSIS — N186 End stage renal disease: Secondary | ICD-10-CM | POA: Diagnosis not present

## 2018-03-02 DIAGNOSIS — N2581 Secondary hyperparathyroidism of renal origin: Secondary | ICD-10-CM | POA: Diagnosis not present

## 2018-03-02 DIAGNOSIS — Z23 Encounter for immunization: Secondary | ICD-10-CM | POA: Diagnosis not present

## 2018-03-04 DIAGNOSIS — Z23 Encounter for immunization: Secondary | ICD-10-CM | POA: Diagnosis not present

## 2018-03-04 DIAGNOSIS — N186 End stage renal disease: Secondary | ICD-10-CM | POA: Diagnosis not present

## 2018-03-04 DIAGNOSIS — N2581 Secondary hyperparathyroidism of renal origin: Secondary | ICD-10-CM | POA: Diagnosis not present

## 2018-03-06 ENCOUNTER — Ambulatory Visit (INDEPENDENT_AMBULATORY_CARE_PROVIDER_SITE_OTHER): Payer: BLUE CROSS/BLUE SHIELD | Admitting: Podiatry

## 2018-03-06 ENCOUNTER — Encounter: Payer: Self-pay | Admitting: Podiatry

## 2018-03-06 DIAGNOSIS — E0842 Diabetes mellitus due to underlying condition with diabetic polyneuropathy: Secondary | ICD-10-CM

## 2018-03-06 MED ORDER — PREGABALIN 75 MG PO CAPS: 75.0000 mg | ORAL_CAPSULE | Freq: Two times a day (BID) | ORAL | 5 refills | Status: DC

## 2018-03-06 NOTE — Patient Instructions (Signed)
1 week post op wound right great toe is healing well.  Continue using Night Splint for heel pain.  May contact another podiatrist to follow up on heel pain.

## 2018-03-06 NOTE — Progress Notes (Signed)
One week post op wound healing is normal.  Denies any discomfort. Still having heel pain on right. Had not been using Night Splints.  Advised to continue with Night Splints.  May continue soaking for another week. Advised to seek another podiatrist for continuity of foot care.

## 2018-03-07 DIAGNOSIS — N186 End stage renal disease: Secondary | ICD-10-CM | POA: Diagnosis not present

## 2018-03-07 DIAGNOSIS — N2581 Secondary hyperparathyroidism of renal origin: Secondary | ICD-10-CM | POA: Diagnosis not present

## 2018-03-08 DIAGNOSIS — H31092 Other chorioretinal scars, left eye: Secondary | ICD-10-CM | POA: Diagnosis not present

## 2018-03-08 DIAGNOSIS — E113593 Type 2 diabetes mellitus with proliferative diabetic retinopathy without macular edema, bilateral: Secondary | ICD-10-CM | POA: Diagnosis not present

## 2018-03-08 DIAGNOSIS — H338 Other retinal detachments: Secondary | ICD-10-CM | POA: Diagnosis not present

## 2018-03-08 DIAGNOSIS — H43391 Other vitreous opacities, right eye: Secondary | ICD-10-CM | POA: Diagnosis not present

## 2018-03-08 DIAGNOSIS — H43812 Vitreous degeneration, left eye: Secondary | ICD-10-CM | POA: Diagnosis not present

## 2018-03-09 DIAGNOSIS — N2581 Secondary hyperparathyroidism of renal origin: Secondary | ICD-10-CM | POA: Diagnosis not present

## 2018-03-09 DIAGNOSIS — N186 End stage renal disease: Secondary | ICD-10-CM | POA: Diagnosis not present

## 2018-03-11 ENCOUNTER — Other Ambulatory Visit: Payer: Self-pay | Admitting: Internal Medicine

## 2018-03-11 DIAGNOSIS — N2581 Secondary hyperparathyroidism of renal origin: Secondary | ICD-10-CM | POA: Diagnosis not present

## 2018-03-11 DIAGNOSIS — N186 End stage renal disease: Secondary | ICD-10-CM | POA: Diagnosis not present

## 2018-03-13 ENCOUNTER — Ambulatory Visit
Admission: RE | Admit: 2018-03-13 | Discharge: 2018-03-13 | Disposition: A | Discharge status: Home / Self Care | Payer: BLUE CROSS/BLUE SHIELD | Source: Ambulatory Visit | Attending: Internal Medicine | Admitting: Internal Medicine

## 2018-03-13 DIAGNOSIS — Z1231 Encounter for screening mammogram for malignant neoplasm of breast: Secondary | ICD-10-CM | POA: Diagnosis not present

## 2018-03-14 DIAGNOSIS — N186 End stage renal disease: Secondary | ICD-10-CM | POA: Diagnosis not present

## 2018-03-14 DIAGNOSIS — N2581 Secondary hyperparathyroidism of renal origin: Secondary | ICD-10-CM | POA: Diagnosis not present

## 2018-03-15 ENCOUNTER — Other Ambulatory Visit: Payer: Self-pay

## 2018-03-15 DIAGNOSIS — H10413 Chronic giant papillary conjunctivitis, bilateral: Secondary | ICD-10-CM | POA: Diagnosis not present

## 2018-03-15 DIAGNOSIS — H04123 Dry eye syndrome of bilateral lacrimal glands: Secondary | ICD-10-CM | POA: Diagnosis not present

## 2018-03-15 DIAGNOSIS — H26491 Other secondary cataract, right eye: Secondary | ICD-10-CM | POA: Diagnosis not present

## 2018-03-15 MED ORDER — PREGABALIN 150 MG PO CAPS: 150.0000 mg | ORAL_CAPSULE | Freq: Every day | ORAL | 0 refills | Status: DC

## 2018-03-16 DIAGNOSIS — N2581 Secondary hyperparathyroidism of renal origin: Secondary | ICD-10-CM | POA: Diagnosis not present

## 2018-03-16 DIAGNOSIS — E1122 Type 2 diabetes mellitus with diabetic chronic kidney disease: Secondary | ICD-10-CM | POA: Diagnosis not present

## 2018-03-16 DIAGNOSIS — Z992 Dependence on renal dialysis: Secondary | ICD-10-CM | POA: Diagnosis not present

## 2018-03-16 DIAGNOSIS — N186 End stage renal disease: Secondary | ICD-10-CM | POA: Diagnosis not present

## 2018-03-18 DIAGNOSIS — N2581 Secondary hyperparathyroidism of renal origin: Secondary | ICD-10-CM | POA: Diagnosis not present

## 2018-03-18 DIAGNOSIS — N186 End stage renal disease: Secondary | ICD-10-CM | POA: Diagnosis not present

## 2018-03-20 DIAGNOSIS — N2581 Secondary hyperparathyroidism of renal origin: Secondary | ICD-10-CM | POA: Diagnosis not present

## 2018-03-20 DIAGNOSIS — I871 Compression of vein: Secondary | ICD-10-CM | POA: Diagnosis not present

## 2018-03-20 DIAGNOSIS — N186 End stage renal disease: Secondary | ICD-10-CM | POA: Diagnosis not present

## 2018-03-20 DIAGNOSIS — Z992 Dependence on renal dialysis: Secondary | ICD-10-CM | POA: Diagnosis not present

## 2018-03-20 DIAGNOSIS — Z23 Encounter for immunization: Secondary | ICD-10-CM | POA: Diagnosis not present

## 2018-03-20 DIAGNOSIS — T82858A Stenosis of vascular prosthetic devices, implants and grafts, initial encounter: Secondary | ICD-10-CM | POA: Diagnosis not present

## 2018-03-23 DIAGNOSIS — N2581 Secondary hyperparathyroidism of renal origin: Secondary | ICD-10-CM | POA: Diagnosis not present

## 2018-03-23 DIAGNOSIS — Z23 Encounter for immunization: Secondary | ICD-10-CM | POA: Diagnosis not present

## 2018-03-23 DIAGNOSIS — N186 End stage renal disease: Secondary | ICD-10-CM | POA: Diagnosis not present

## 2018-03-24 DIAGNOSIS — E113511 Type 2 diabetes mellitus with proliferative diabetic retinopathy with macular edema, right eye: Secondary | ICD-10-CM | POA: Diagnosis not present

## 2018-03-24 DIAGNOSIS — H3582 Retinal ischemia: Secondary | ICD-10-CM | POA: Diagnosis not present

## 2018-03-24 DIAGNOSIS — E113212 Type 2 diabetes mellitus with mild nonproliferative diabetic retinopathy with macular edema, left eye: Secondary | ICD-10-CM | POA: Diagnosis not present

## 2018-03-24 DIAGNOSIS — Z961 Presence of intraocular lens: Secondary | ICD-10-CM | POA: Diagnosis not present

## 2018-03-25 DIAGNOSIS — N2581 Secondary hyperparathyroidism of renal origin: Secondary | ICD-10-CM | POA: Diagnosis not present

## 2018-03-25 DIAGNOSIS — N186 End stage renal disease: Secondary | ICD-10-CM | POA: Diagnosis not present

## 2018-03-25 DIAGNOSIS — Z23 Encounter for immunization: Secondary | ICD-10-CM | POA: Diagnosis not present

## 2018-03-27 ENCOUNTER — Ambulatory Visit (INDEPENDENT_AMBULATORY_CARE_PROVIDER_SITE_OTHER): Payer: BLUE CROSS/BLUE SHIELD | Admitting: Internal Medicine

## 2018-03-27 ENCOUNTER — Other Ambulatory Visit: Payer: Self-pay

## 2018-03-27 ENCOUNTER — Encounter: Payer: Self-pay | Admitting: Internal Medicine

## 2018-03-27 VITALS — BP 126/72 | HR 78 | Temp 97.6°F | Ht 63.0 in | Wt 228.8 lb

## 2018-03-27 DIAGNOSIS — E1169 Type 2 diabetes mellitus with other specified complication: Secondary | ICD-10-CM

## 2018-03-27 DIAGNOSIS — E66813 Obesity, class 3: Secondary | ICD-10-CM

## 2018-03-27 DIAGNOSIS — I10 Essential (primary) hypertension: Principal | ICD-10-CM

## 2018-03-27 DIAGNOSIS — E669 Obesity, unspecified: Secondary | ICD-10-CM | POA: Diagnosis not present

## 2018-03-27 DIAGNOSIS — Z992 Dependence on renal dialysis: Secondary | ICD-10-CM

## 2018-03-27 DIAGNOSIS — I120 Hypertensive chronic kidney disease with stage 5 chronic kidney disease or end stage renal disease: Secondary | ICD-10-CM | POA: Diagnosis not present

## 2018-03-27 DIAGNOSIS — E1159 Type 2 diabetes mellitus with other circulatory complications: Secondary | ICD-10-CM

## 2018-03-27 DIAGNOSIS — N186 End stage renal disease: Secondary | ICD-10-CM

## 2018-03-27 DIAGNOSIS — Z6841 Body Mass Index (BMI) 40.0 and over, adult: Secondary | ICD-10-CM

## 2018-03-27 LAB — HEMOGLOBIN A1C
Est. average glucose Bld gHb Est-mCnc: 197 mg/dL
HEMOGLOBIN A1C: 8.5 % — AB (ref 4.8–5.6)

## 2018-03-27 MED ORDER — HUMULIN R U-500 (CONCENTRATED) 500 UNIT/ML ~~LOC~~ SOLN: SUBCUTANEOUS | 11 refills | Status: DC

## 2018-03-27 NOTE — Patient Instructions (Signed)

## 2018-03-27 NOTE — Progress Notes (Signed)
Subjective:     Patient ID: Cassandra Campos , female    DOB: 1970-09-09 , 47 y.o.   MRN: [id number]   Chief Complaint  Patient presents with  . Diabetes  . Hypertension    HPI  Diabetes  She presents for her follow-up diabetic visit. She has type 2 diabetes mellitus. Pertinent negatives for diabetes include no chest pain, no fatigue, no foot paresthesias, no weakness and no weight loss. There are no hypoglycemic complications. Diabetic complications include nephropathy and retinopathy. Risk factors for coronary artery disease include diabetes mellitus, dyslipidemia, hypertension, obesity and sedentary lifestyle.  Hypertension  This is a chronic problem. The current episode started more than 1 year ago. The problem has been resolved since onset. The problem is controlled. Pertinent negatives include no chest pain. Compliance problems include exercise.  Hypertensive end-organ damage includes retinopathy.     Past Medical History:  Diagnosis Date  . Anemia associated with chronic renal failure   . ASCUS of cervix with negative high risk HPV 06/2017  . CKD (chronic kidney disease), stage IV ([place]) no dialysis yet   nephrologist-  dr Jamal Maes-- scheduled next visit 09-08-2017 (lov 07-05-2017)  . Diabetic retinopathy ([place])   . Elevated transaminase level   . Fatty liver    FOLLOWED BY DR Henrene Pastor  . Hypertension   . Idiopathic gout of multiple sites    09-05-2017 per pt stable  . Insulin dependent type 2 diabetes mellitus ([place])    followed by dr Toy Care  . Mixed hyperlipidemia   . OSA (obstructive sleep apnea)    per study 10-20-2015 mild osa w/ AHI 10.3/hr;  09-05-2017 per pt has not used cpap since 1 yr ago  . Peripheral neuropathy    feet  . S/P arteriovenous (AV) fistula creation 07-04-2015  w/ revision 02-14-2017   left braniocephilic  . Trigger thumb, right thumb   . Umbilical hernia      Family History  Problem Relation Age of Onset  . Diabetes Mother   .  Hypertension Mother   . Diabetes Father   . Hypertension Father   . Cancer Father        STOMACH  . Stomach cancer Father   . Stroke Maternal Grandmother   . Stroke Maternal Grandfather   . Colon cancer Neg Hx   . Rectal cancer Neg Hx      Current Outpatient Medications:  .  allopurinol (ZYLOPRIM) 300 MG tablet, Take 300 mg by mouth every morning. , Disp: , Rfl: 5 .  amLODipine (NORVASC) 10 MG tablet, Take 10 mg by mouth every morning., Disp: , Rfl:  .  B Complex-C-Folic Acid (DIALYVITE TABLET) TABS, Take by mouth., Disp: , Rfl:  .  colchicine 0.6 MG tablet, Take 1 tablet (0.6 mg total) by mouth 2 (two) times daily. Take BID for acute gout attack, stop when acute pain resolves (Patient taking differently: Take 0.6 mg by mouth 3 (three) times a week. Take BID for acute gout attack, stop when acute pain resolves), Disp: 60 tablet, Rfl: 3 .  HUMULIN R 500 UNIT/ML injection, 180 units in the morning and 140 units in the evening before meals, Disp: 20 mL, Rfl: 11 .  lanthanum (FOSRENOL) 1000 MG chewable tablet, Chew 1,000 mg by mouth 3 (three) times daily with meals., Disp: , Rfl:  .  nystatin-triamcinolone (MYCOLOG II) cream, Apply 1 application topically 2 (two) times daily., Disp: 30 g, Rfl: 2 .  pravastatin (PRAVACHOL) 40 MG tablet, TAKE 1  TABLET BY ORAL ROUTE EVERY DAY, Disp: 90 tablet, Rfl: 1 .  pregabalin (LYRICA) 150 MG capsule, Take 1 capsule (150 mg total) by mouth daily., Disp: 90 capsule, Rfl: 0   No Known Allergies   Review of Systems  Constitutional: Negative.  Negative for fatigue and weight loss.  Respiratory: Negative.   Cardiovascular: Negative.  Negative for chest pain.  Gastrointestinal: Negative.   Musculoskeletal: Negative.   Neurological: Negative.  Negative for weakness.  Psychiatric/Behavioral: Negative.      Today's Vitals   03/27/18 0858  BP: 126/72  Pulse: 78  Temp: 97.6 F (36.4 C)  TempSrc: Oral  Weight: 228 lb 12.8 oz (103.8 kg)  Height: 5\' 3"   (1.6 m)   Body mass index is 40.53 kg/m.   Objective:  Physical Exam  Constitutional: She is oriented to person, place, and time. She appears well-developed and well-nourished.  HENT:  Head: Normocephalic and atraumatic.  Cardiovascular: Normal rate, regular rhythm and normal heart sounds.  Pulmonary/Chest: Effort normal and breath sounds normal.  Neurological: She is alert and oriented to person, place, and time.  Psychiatric: She has a normal mood and affect.  Nursing note and vitals reviewed.       Assessment And Plan:     1. Obesity, diabetes, and hypertension syndrome ([place])  I will check an a1c today. She is encouraged to discuss use of GLP-1 agents with her nephrologist. She is encouraged to avoid refined carbs. She is encouraged to incorporate more activity into her daily routine.   - Hemoglobin A1c  2. End stage renal failure on dialysis [facility])  As per nephrology. She is awaiting evaluation for a transplant.  Importance of achieving glycemic control was reiterated to the patient.   3. 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 She will continue with current meds. She is encouraged to avoid adding salt to her foods.   4. Class 3 severe obesity due to excess calories with serious comorbidity and body mass index (BMI) of 40.0 to 44.9 in adult [facility])  She is encouraged to strive for BMI less than 35 to decrease cardiac risk. She is encouraged to exercise no less than five days weekly for 30 minutes.   Maximino Greenland, MD

## 2018-03-28 DIAGNOSIS — Z23 Encounter for immunization: Secondary | ICD-10-CM | POA: Diagnosis not present

## 2018-03-28 DIAGNOSIS — N2581 Secondary hyperparathyroidism of renal origin: Secondary | ICD-10-CM | POA: Diagnosis not present

## 2018-03-28 DIAGNOSIS — N186 End stage renal disease: Secondary | ICD-10-CM | POA: Diagnosis not present

## 2018-03-28 NOTE — Progress Notes (Signed)
Hello,   Here are your lab results:  Your hba1c is 8.5, our goal is less than 7.9.  Have you spoken with your nephrologist regarding the [place]?

## 2018-03-30 DIAGNOSIS — N2581 Secondary hyperparathyroidism of renal origin: Secondary | ICD-10-CM | POA: Diagnosis not present

## 2018-03-30 DIAGNOSIS — N186 End stage renal disease: Secondary | ICD-10-CM | POA: Diagnosis not present

## 2018-03-30 DIAGNOSIS — Z23 Encounter for immunization: Secondary | ICD-10-CM | POA: Diagnosis not present

## 2018-03-31 ENCOUNTER — Other Ambulatory Visit: Payer: Self-pay

## 2018-03-31 ENCOUNTER — Ambulatory Visit: Payer: BLUE CROSS/BLUE SHIELD

## 2018-03-31 VITALS — BP 112/66 | HR 82 | Temp 98.0°F | Ht 63.0 in | Wt 230.0 lb

## 2018-03-31 DIAGNOSIS — E0829 Diabetes mellitus due to underlying condition with other diabetic kidney complication: Secondary | ICD-10-CM

## 2018-03-31 MED ORDER — SEMAGLUTIDE(0.25 OR 0.5MG/DOS) 2 MG/1.5ML ~~LOC~~ SOPN: 0.5000 mg | PEN_INJECTOR | SUBCUTANEOUS | 1 refills | Status: DC

## 2018-03-31 NOTE — Progress Notes (Signed)
The patient was instructed on how to use the Ozempic medication and told to let the office know if she has any complications.

## 2018-04-01 DIAGNOSIS — N2581 Secondary hyperparathyroidism of renal origin: Secondary | ICD-10-CM | POA: Diagnosis not present

## 2018-04-01 DIAGNOSIS — N186 End stage renal disease: Secondary | ICD-10-CM | POA: Diagnosis not present

## 2018-04-01 DIAGNOSIS — Z23 Encounter for immunization: Secondary | ICD-10-CM | POA: Diagnosis not present

## 2018-04-03 DIAGNOSIS — N186 End stage renal disease: Secondary | ICD-10-CM | POA: Diagnosis not present

## 2018-04-03 DIAGNOSIS — N2581 Secondary hyperparathyroidism of renal origin: Secondary | ICD-10-CM | POA: Diagnosis not present

## 2018-04-04 ENCOUNTER — Encounter (HOSPITAL_COMMUNITY): Payer: Self-pay | Admitting: Emergency Medicine

## 2018-04-04 ENCOUNTER — Ambulatory Visit (HOSPITAL_COMMUNITY)
Admission: EM | Admit: 2018-04-04 | Discharge: 2018-04-04 | Disposition: A | Discharge status: Home / Self Care | Payer: BLUE CROSS/BLUE SHIELD | Attending: Family Medicine | Admitting: Family Medicine

## 2018-04-04 DIAGNOSIS — R0789 Other chest pain: Secondary | ICD-10-CM | POA: Diagnosis not present

## 2018-04-04 DIAGNOSIS — R42 Dizziness and giddiness: Secondary | ICD-10-CM

## 2018-04-04 LAB — POCT I-STAT, CHEM 8
BUN: 35 mg/dL — ABNORMAL HIGH (ref 6–20)
CALCIUM ION: 1.07 mmol/L — AB (ref 1.15–1.40)
Chloride: 91 mmol/L — ABNORMAL LOW (ref 98–111)
Creatinine, Ser: 6.8 mg/dL — ABNORMAL HIGH (ref 0.44–1.00)
Glucose, Bld: 339 mg/dL — ABNORMAL HIGH (ref 70–99)
HCT: 36 % (ref 36.0–46.0)
HEMOGLOBIN: 12.2 g/dL (ref 12.0–15.0)
Potassium: 3.8 mmol/L (ref 3.5–5.1)
Sodium: 133 mmol/L — ABNORMAL LOW (ref 135–145)
TCO2: 32 mmol/L (ref 22–32)

## 2018-04-04 LAB — POCT URINALYSIS DIP (DEVICE)
GLUCOSE, UA: 500 mg/dL — AB
Ketones, ur: NEGATIVE mg/dL
Leukocytes, UA: NEGATIVE
NITRITE: NEGATIVE
Protein, ur: >=300 mg/dL — AB
Specific Gravity, Urine: >=1.03 (ref 1.005–1.030)
UROBILINOGEN UA: 0.2 mg/dL (ref 0.0–1.0)
pH: 5 (ref 5.0–8.0)

## 2018-04-04 NOTE — Discharge Instructions (Addendum)
The EKG we did here today was normal Your sodium was slightly low on the blood work but your potassium was normal Your blood sugar was 339 today Your urine was negative for infection I believe that your lightheadedness and mild tachycardia could be due to some slight dehydration or your increase in blood sugar.  I would like for you to go home and make sure you are drinking fluids and staying hydrated.  Get some rest If your symptoms continue you may want a follow-up with your primary care doctor to discuss your medications. If your symptoms worsen to include severe dizziness, chest pain, shortness of breath or any other worrisome symptoms please go to the ER

## 2018-04-04 NOTE — ED Provider Notes (Signed)
Cassandra Campos    CSN: [id number] Arrival date & time: 04/04/18  1246     History   Chief Complaint Chief Complaint  Patient presents with  . Dizziness    HPI Cassandra Campos is a 47 y.o. female.   Is a 47 year old female with past medical history of diabetes, hypertension, gout, chronic kidney disease on dialysis.  She presents with lightheadedness that started after eating lunch this afternoon.  Reports that she ate a piece of chicken that might of had an increased amount of sodium on it.  Some coworkers checked her blood pressure which was 140/92.  She denies any nausea, vomiting or severe dizziness but she does have some congestion and PND.  Reports she had some mild chest pain when this episode started.  Last dialysis treatment was last night. Reports that when normally.  Patient's blood sugars have also been elevated.  She started a new diabetes medication on Friday called Ozempic.  She takes this once weekly along with her insulin.  She denies any nausea, vomiting, diarrhea.  She denies any cough, congestion or fevers.  She denies any headache, blurred vision or weakness in extremities.  ROS per HPI      Past Medical History:  Diagnosis Date  . Anemia associated with chronic renal failure   . ASCUS of cervix with negative high risk HPV 06/2017  . CKD (chronic kidney disease), stage IV ([place]) no dialysis yet   nephrologist-  dr Jamal Maes-- scheduled next visit 09-08-2017 (lov 07-05-2017)  . Diabetic retinopathy ([place])   . Elevated transaminase level   . Fatty liver    FOLLOWED BY DR Henrene Pastor  . Hypertension   . Idiopathic gout of multiple sites    09-05-2017 per pt stable  . Insulin dependent type 2 diabetes mellitus ([place])    followed by dr Toy Care  . Mixed hyperlipidemia   . OSA (obstructive sleep apnea)    per study 10-20-2015 mild osa w/ AHI 10.3/hr;  09-05-2017 per pt has not used cpap since 1 yr ago  . Peripheral neuropathy    feet  . S/P  arteriovenous (AV) fistula creation 07-04-2015  w/ revision 02-14-2017   left braniocephilic  . Trigger thumb, right thumb   . Umbilical hernia     Patient Active Problem List   Diagnosis Date Noted  . 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 Poor compliance with CPAP treatment 08/02/2016  . Acute idiopathic gout of multiple sites 05/13/2016  . Chronic kidney disease (CKD), stage IV (severe) ([place]) 08/19/2015  . Diabetes mellitus due to underlying condition with other diabetic kidney complication ([place]) 05/09/8249  . CKD (chronic kidney disease) stage 3, GFR 30-59 ml/min ([facility]) 03/18/2015  . Morbid obesity due to excess calories ([place]) 03/18/2015  . Excessive daytime sleepiness 03/18/2015  . Insomnia w/ sleep apnea 03/18/2015  . Hypersomnia with sleep apnea 03/18/2015  . Bacterial vaginosis 10/11/2012  . Type II or unspecified type diabetes mellitus without mention of complication, uncontrolled   . High triglycerides   . Hypertension     Past Surgical History:  Procedure Laterality Date  . ABDOMINAL HYSTERECTOMY  07/11/2000   dr gott   TAH/BSO for irregular bleeding and leiomyoma  . AV FISTULA PLACEMENT Left 07/04/2015   Procedure: ARTERIOVENOUS (AV) FISTULA CREATION-LEFT;  Surgeon: Mal Misty, MD;  Location: [facility];  Service: Vascular;  Laterality: Left;  . BREAST EXCISIONAL BIOPSY Right    benign  . CARPAL TUNNEL RELEASE Bilateral 1993 and 1994  .  CESAREAN SECTION  1993:  09-25-1999   BILATERAL TUBAL LIGATION W/ LAST C/S  . DILATION AND CURETTAGE OF UTERUS    . EXPLORATORY LAPARTOMY / LYSIS ADHESIONS/  BILATERAL SALPINGOOPHORECTOMY  07-20-2011  dr s. Rhodia Albright  [facility]  . FISTULA SUPERFICIALIZATION Left 08/28/2015   Procedure: BRACHIOCEPHALIC FISTULA SUPERFICIALIZATION;  Surgeon: Mal Misty, MD;  Location: [facility];  Service: Vascular;  Laterality: Left;  . PATCH ANGIOPLASTY Left 02/14/2017   Procedure: PATCH ANGIOPLASTY;  Surgeon: Elam Dutch, MD;  Location: [facility];  Service:  Vascular;  Laterality: Left;  . PELVIC LAPAROSCOPY  1994   exploration for ectopic preg.  Marland Kitchen RETINAL DETACHMENT SURGERY Left 2015  . REVISON OF ARTERIOVENOUS FISTULA Left 02/14/2017   Procedure: REVISON OF ARTERIOVENOUS FISTULA  LEFT ARM;  Surgeon: Elam Dutch, MD;  Location: [facility];  Service: Vascular;  Laterality: Left;  . TRIGGER FINGER RELEASE Left 2015   thumb  . TRIGGER FINGER RELEASE Right 09/09/2017   Procedure: RELEASE TRIGGER FINGER/A-1 PULLEY RIGHT THUMB;  Surgeon: Dorna Leitz, MD;  Location: [facility];  Service: Orthopedics;  Laterality: Right;    OB History    Gravida  5   Para  2   Term  2   Preterm      AB  3   Living  2     SAB      TAB      Ectopic      Multiple      Live Births               Home Medications    Prior to Admission medications   Medication Sig Start Date End Date Taking? Authorizing Provider  allopurinol (ZYLOPRIM) 300 MG tablet Take 300 mg by mouth every morning.  05/27/16   [provider]  amLODipine (NORVASC) 10 MG tablet Take 10 mg by mouth every morning.    [provider]  B Complex-C-Folic Acid (DIALYVITE TABLET) TABS Take by mouth.    [provider]  colchicine 0.6 MG tablet Take 1 tablet (0.6 mg total) by mouth 2 (two) times daily. Take BID for acute gout attack, stop when acute pain resolves Patient taking differently: Take 0.6 mg by mouth 3 (three) times a week. Take BID for acute gout attack, stop when acute pain resolves 05/14/16   Newt Minion, MD  HUMULIN R 500 UNIT/ML injection 180 units in the morning and 140 units in the evening before meals 03/27/18   Glendale Chard, MD  lanthanum (FOSRENOL) 1000 MG chewable tablet Chew 1,000 mg by mouth 3 (three) times daily with meals.    [provider]  nystatin-triamcinolone (MYCOLOG II) cream Apply 1 application topically 2 (two) times daily. 07/11/17   Fontaine, Belinda Block, MD  pravastatin (PRAVACHOL) 40 MG tablet  TAKE 1 TABLET BY ORAL ROUTE EVERY DAY 03/13/18   Glendale Chard, MD  pregabalin (LYRICA) 150 MG capsule Take 1 capsule (150 mg total) by mouth daily. 03/15/18   Glendale Chard, MD  Semaglutide,0.25 or 0.5MG /DOS, (OZEMPIC, 0.25 OR 0.5 MG/DOSE,) 2 MG/1.5ML SOPN Inject 0.5 mg into the skin once a week. 03/31/18   Glendale Chard, MD    Family History Family History  Problem Relation Age of Onset  . Diabetes Mother   . Hypertension Mother   . Diabetes Father   . Hypertension Father   . Cancer Father        STOMACH  . Stomach cancer Father   . Stroke  Maternal Grandmother   . Stroke Maternal Grandfather   . Colon cancer Neg Hx   . Rectal cancer Neg Hx     Social History Social History   Tobacco Use  . Smoking status: Never Smoker  . Smokeless tobacco: Never Used  Substance Use Topics  . Alcohol use: Yes    Alcohol/week: 0.0 standard drinks    Comment: RARE  . Drug use: No     Allergies   Patient has no known allergies.   Review of Systems Review of Systems   Physical Exam Triage Vital Signs ED Triage Vitals  Enc Vitals Group     BP 04/04/18 1321 (!) 145/78     Pulse Rate 04/04/18 1321 (!) 109     Resp 04/04/18 1321 18     Temp 04/04/18 1321 98.5 F (36.9 C)     Temp Source 04/04/18 1321 Oral     SpO2 04/04/18 1321 99 %     Weight --      Height --      Head Circumference --      Peak Flow --      Pain Score 04/04/18 1322 2     Pain Loc --      Pain Edu? --      Excl. in [place]? --    Orthostatic VS for the past 24 hrs:  BP- Lying Pulse- Lying BP- Sitting Pulse- Sitting BP- Standing at 0 minutes Pulse- Standing at 0 minutes  04/04/18 1422 128/60 112 112/62 111 116/58 120    Updated Vital Signs BP (!) 145/78 (BP Location: Right Arm)   Pulse (!) 109   Temp 98.5 F (36.9 C) (Oral)   Resp 18   SpO2 99%   Visual Acuity Right Eye Distance:   Left Eye Distance:   Bilateral Distance:    Right Eye Near:   Left Eye Near:    Bilateral Near:     Physical  Exam  Constitutional: She is oriented to person, place, and time. She appears well-developed and well-nourished. No distress.  HENT:  Head: Normocephalic and atraumatic.  Right Ear: External ear normal.  Left Ear: External ear normal.  Nose: Nose normal.  Mouth/Throat: Oropharynx is clear and moist. No oropharyngeal exudate.  Mild nasal turbinate swelling without sinus pressure. Bilateral TMs normal  Eyes: Pupils are equal, round, and reactive to light. Conjunctivae are normal.  Neck: Normal range of motion. No thyromegaly present.  Cardiovascular: Normal rate, regular rhythm and normal heart sounds.  No murmur heard. Mild tachycardia   Pulmonary/Chest: Effort normal and breath sounds normal. No stridor. No respiratory distress. She has no wheezes. She has no rales. She exhibits no tenderness.  Abdominal: Soft.  Musculoskeletal: Normal range of motion.  Lymphadenopathy:    She has no cervical adenopathy.  Neurological: She is alert and oriented to person, place, and time. No sensory deficit.  No focal neuro deficits Strength 5 out of 5 in all extremities Cranial nerves intact  Skin: Skin is warm and dry. She is not diaphoretic.  Psychiatric: She has a normal mood and affect.  Nursing note and vitals reviewed.    UC Treatments / Results  Labs (all labs ordered are listed, but only abnormal results are displayed) Labs Reviewed  POCT I-STAT, CHEM 8 - Abnormal; Notable for the following components:      Result Value   Sodium 133 (*)    Chloride 91 (*)    BUN 35 (*)    Creatinine, Ser  6.80 (*)    Glucose, Bld 339 (*)    Calcium, Ion 1.07 (*)    All other components within normal limits  POCT URINALYSIS DIP (DEVICE) - Abnormal; Notable for the following components:   Glucose, UA 500 (*)    Bilirubin Urine MODERATE (*)    Hgb urine dipstick MODERATE (*)    Protein, ur >=300 (*)    All other components within normal limits    EKG None  Radiology No results  found.  Procedures Procedures (including critical care time)  Medications Ordered in UC Medications - No data to display  Initial Impression / Assessment and Plan / UC Course  I have reviewed the triage vital signs and the nursing notes.  Pertinent labs & imaging results that were available during my care of the patient were reviewed by me and considered in my medical decision making (see chart for details).     Is a 47 year old female with past medical history of chronic kidney disease on dialysis and diabetes.  She presents with sudden onset of lightheadedness that started after lunch today.  She did have some associated chest pressure which has resolved.  She denies any shortness of breath or palpitations.    EKG we did was similar to previous EKGs without any acute findings.  No concern for ACS.  I-STAT revealed low sodium 133 potassium was normal.  Blood sugar was 339.  Urine was negative for any infection.  Her symptoms and mild tachycardia could be due to slight dehydration or elevated blood sugar.  I am not seeing any sources of infection.   She could have early infection although  Exam was normal and  Lungs clear. She is nontoxic or ill-appearing.  Blood pressure with a normal limits.  No focal neuro deficits on exam. No concern for PE. I feel like patient is safe to go home and rest.  Instructed to stay hydrated and sip fluids. Strict precautions that if she develops any worsening symptoms to include severe chest pain, shortness of breath, dizziness, headache, blurred vision she needs to go to the ER. Also mentioned that she may want to talk to her doctor about the new medication that she started last Friday for diabetes. This could be contributing to her symptoms Patient understanding and agreeable to plan Final Clinical Impressions(s) / UC Diagnoses   Final diagnoses:  Lightheadedness     Discharge Instructions     The EKG we did here today was normal Your sodium  was slightly low on the blood work but your potassium was normal Your blood sugar was 339 today Your urine was negative for infection I believe that your lightheadedness and mild tachycardia could be due to some slight dehydration or your increase in blood sugar.  I would like for you to go home and make sure you are drinking fluids and staying hydrated.  Get some rest If your symptoms continue you may want a follow-up with your primary care doctor to discuss your medications. If your symptoms worsen to include severe dizziness, chest pain, shortness of breath or any other worrisome symptoms please go to the ER     ED Prescriptions    None     Controlled Substance Prescriptions [place] Controlled Substance Registry consulted? Not Applicable   Orvan July, NP 04/04/18 1524

## 2018-04-04 NOTE — ED Triage Notes (Signed)
Pt sts became lightheaded after eating lunch today; pt dialysis pt with last treatment yesterday; pt sts CBG was 314

## 2018-04-05 DIAGNOSIS — N186 End stage renal disease: Secondary | ICD-10-CM | POA: Diagnosis not present

## 2018-04-05 DIAGNOSIS — N2581 Secondary hyperparathyroidism of renal origin: Secondary | ICD-10-CM | POA: Diagnosis not present

## 2018-04-06 ENCOUNTER — Encounter: Payer: Self-pay | Admitting: Internal Medicine

## 2018-04-06 ENCOUNTER — Ambulatory Visit (INDEPENDENT_AMBULATORY_CARE_PROVIDER_SITE_OTHER): Payer: BLUE CROSS/BLUE SHIELD | Admitting: Internal Medicine

## 2018-04-06 VITALS — BP 136/78 | HR 129 | Temp 98.5°F | Ht 63.0 in | Wt 227.2 lb

## 2018-04-06 DIAGNOSIS — J069 Acute upper respiratory infection, unspecified: Secondary | ICD-10-CM

## 2018-04-06 NOTE — Progress Notes (Signed)
Subjective:     Patient ID: Cassandra Campos , female    DOB: 1971-02-05 , 47 y.o.   MRN: [id number]   Chief Complaint  Patient presents with  . Dizziness    C/O being congested     HPI Has been having post nasal drainage x 1 week, while at dialysis next to her she was next to someone with a cough. Yesterday got stuffy, more post nasal drainage. Has been having sweats. Glucose this am 101. Last night 127.  Has mild ST, no ear pain.  Cough is non productive. Unable to spit PND.  Denies prone to have secondary infections.  Was in ER 11/19 with dizziness which has resolved. States her glucose was high since she took an empty insulin pen to work instead of full one that day.    Past Medical History:  Diagnosis Date  . Anemia associated with chronic renal failure   . ASCUS of cervix with negative high risk HPV 06/2017  . CKD (chronic kidney disease), stage IV ([place]) no dialysis yet   nephrologist-  dr Jamal Maes-- scheduled next visit 09-08-2017 (lov 07-05-2017)  . Diabetic retinopathy ([place])   . Elevated transaminase level   . Fatty liver    FOLLOWED BY DR Henrene Pastor  . Hypertension   . Idiopathic gout of multiple sites    09-05-2017 per pt stable  . Insulin dependent type 2 diabetes mellitus ([place])    followed by dr Toy Care  . Mixed hyperlipidemia   . OSA (obstructive sleep apnea)    per study 10-20-2015 mild osa w/ AHI 10.3/hr;  09-05-2017 per pt has not used cpap since 1 yr ago  . Peripheral neuropathy    feet  . S/P arteriovenous (AV) fistula creation 07-04-2015  w/ revision 02-14-2017   left braniocephilic  . Trigger thumb, right thumb   . Umbilical hernia      Family History  Problem Relation Age of Onset  . Diabetes Mother   . Hypertension Mother   . Diabetes Father   . Hypertension Father   . Cancer Father        STOMACH  . Stomach cancer Father   . Stroke Maternal Grandmother   . Stroke Maternal Grandfather   . Colon cancer Neg Hx   . Rectal cancer Neg  Hx      Current Outpatient Medications:  .  allopurinol (ZYLOPRIM) 300 MG tablet, Take 300 mg by mouth every morning. , Disp: , Rfl: 5 .  amLODipine (NORVASC) 10 MG tablet, Take 10 mg by mouth every morning., Disp: , Rfl:  .  B Complex-C-Folic Acid (DIALYVITE TABLET) TABS, Take by mouth., Disp: , Rfl:  .  colchicine 0.6 MG tablet, Take 1 tablet (0.6 mg total) by mouth 2 (two) times daily. Take BID for acute gout attack, stop when acute pain resolves (Patient taking differently: Take 0.6 mg by mouth 3 (three) times a week. Take BID for acute gout attack, stop when acute pain resolves), Disp: 60 tablet, Rfl: 3 .  cromolyn (OPTICROM) 4 % ophthalmic solution, INSTILL 1 DROP INTO BOTH EYES FOUR TIMES A DAY, Disp: , Rfl: 2 .  EASY COMFORT PEN NEEDLES 31G X 8 MM MISC, INJECT TWICE A DAY AS DIRECTED, Disp: , Rfl: 3 .  glucose blood (ONETOUCH VERIO) test strip, , Disp: , Rfl:  .  HUMULIN R 500 UNIT/ML injection, 180 units in the morning and 140 units in the evening before meals, Disp: 20 mL, Rfl: 11 .  Insulin  Glargine (LANTUS SOLOSTAR) 100 UNIT/ML Solostar Pen, 45 units twice daily ( Pt doesn't start until Saturday 04-24-12 ), Disp: , Rfl:  .  lanthanum (FOSRENOL) 1000 MG chewable tablet, Chew 1,000 mg by mouth 3 (three) times daily with meals., Disp: , Rfl:  .  lidocaine-prilocaine (EMLA) cream, USE ON ARAMS AS NEEDED FOR DIALYSIS, Disp: , Rfl: 10 .  nystatin-triamcinolone (MYCOLOG II) cream, Apply 1 application topically 2 (two) times daily., Disp: 30 g, Rfl: 2 .  pravastatin (PRAVACHOL) 40 MG tablet, TAKE 1 TABLET BY ORAL ROUTE EVERY DAY, Disp: 90 tablet, Rfl: 1 .  pregabalin (LYRICA) 150 MG capsule, Take 1 capsule (150 mg total) by mouth daily., Disp: 90 capsule, Rfl: 0 .  RESTASIS 0.05 % ophthalmic emulsion, INSTILL 1 DROP INTO BOTH EYES TWICE A DAY, Disp: , Rfl: 3 .  Semaglutide,0.25 or 0.5MG /DOS, (OZEMPIC, 0.25 OR 0.5 MG/DOSE,) 2 MG/1.5ML SOPN, Inject 0.5 mg into the skin once a week., Disp: 1 pen,  Rfl: 1   No Known Allergies   Review of Systems  Constitutional: Negative for appetite change, chills, diaphoresis and fever.  HENT: Positive for congestion. Negative for ear discharge, ear pain, postnasal drip, rhinorrhea, sore throat, trouble swallowing and voice change.        Gets stuffiness of R ear     Today's Vitals   04/06/18 1535 04/06/18 1542  BP: 140/70 136/78  Pulse: (!) 110 (!) 129  Temp: 98.5 F (36.9 C)   TempSrc: Oral   SpO2: 96%   Weight: 227 lb 3.2 oz (103.1 kg)   Height: 5\' 3"  (1.6 m)    Body mass index is 40.25 kg/m.   Objective:  Physical Exam  Constitutional: She is oriented to person, place, and time.  HENT:  Head: Normocephalic.  Right Ear: External ear normal.  Left Ear: External ear normal.  Mouth/Throat: Oropharynx is clear and moist. No oropharyngeal exudate.  R TM is slightly dull, L gray and shiny.  Nasal mucosa is moderately swollen with clear mucous.   Eyes: Conjunctivae are normal. Right eye exhibits no discharge. Left eye exhibits no discharge. No scleral icterus.  Neck: Neck supple. No tracheal deviation present.  Cardiovascular: Normal rate, regular rhythm and normal heart sounds.  No murmur heard. Rate 98  Pulmonary/Chest: Effort normal and breath sounds normal.  Lymphadenopathy:    She has no cervical adenopathy.  Neurological: She is alert and oriented to person, place, and time.        Assessment And Plan:    1. Upper respiratory infection, viral- acute. Explained antibiotics will not help this. Advised to do saline nose rinses bid. Instructions given.  Fu prn.    Noa Galvao RODRIGUEZ-SOUTHWORTH, PA-C

## 2018-04-06 NOTE — Patient Instructions (Signed)
Try Netie Pot rinses twice a day ,but not at bed time for 3-5 days Do not use tap water, only distilled or cooled down boiled water Avoid dairy Try Floanse generic brand is ok. 2 sprays each nostril for 5-7 day Come back or go to Urgent Care if you get fever, or ear pain, or coughing worse   Upper Respiratory Infection, Adult Most upper respiratory infections (URIs) are caused by a virus. A URI affects the nose, throat, and upper air passages. The most common type of URI is often called "the common cold." Follow these instructions at home:  Take medicines only as told by your doctor.  Gargle warm saltwater or take cough drops to comfort your throat as told by your doctor.  Use a warm mist humidifier or inhale steam from a shower to increase air moisture. This may make it easier to breathe.  Drink enough fluid to keep your pee (urine) clear or pale yellow.  Eat soups and other clear broths.  Have a healthy diet.  Rest as needed.  Go back to work when your fever is gone or your doctor says it is okay. ? You may need to stay home longer to avoid giving your URI to others. ? You can also wear a face mask and wash your hands often to prevent spread of the virus.  Use your inhaler more if you have asthma.  Do not use any tobacco products, including cigarettes, chewing tobacco, or electronic cigarettes. If you need help quitting, ask your doctor. Contact a doctor if:  You are getting worse, not better.  Your symptoms are not helped by medicine.  You have chills.  You are getting more short of breath.  You have brown or red mucus.  You have yellow or brown discharge from your nose.  You have pain in your face, especially when you bend forward.  You have a fever.  You have puffy (swollen) neck glands.  You have pain while swallowing.  You have white areas in the back of your throat. Get help right away if:  You have very bad or constant: ? Headache. ? Ear  pain. ? Pain in your forehead, behind your eyes, and over your cheekbones (sinus pain). ? Chest pain.  You have long-lasting (chronic) lung disease and any of the following: ? Wheezing. ? Long-lasting cough. ? Coughing up blood. ? A change in your usual mucus.  You have a stiff neck.  You have changes in your: ? Vision. ? Hearing. ? Thinking. ? Mood. This information is not intended to replace advice given to you by your health care provider. Make sure you discuss any questions you have with your health care provider. Document Released: 10/20/2007 Document Revised: 01/04/2016 Document Reviewed: 08/08/2013 Elsevier Interactive Patient Education  2018 [employer].

## 2018-04-07 DIAGNOSIS — N186 End stage renal disease: Secondary | ICD-10-CM | POA: Diagnosis not present

## 2018-04-07 DIAGNOSIS — N2581 Secondary hyperparathyroidism of renal origin: Secondary | ICD-10-CM | POA: Diagnosis not present

## 2018-04-09 DIAGNOSIS — N186 End stage renal disease: Secondary | ICD-10-CM | POA: Diagnosis not present

## 2018-04-09 DIAGNOSIS — N2581 Secondary hyperparathyroidism of renal origin: Secondary | ICD-10-CM | POA: Diagnosis not present

## 2018-04-11 ENCOUNTER — Encounter: Payer: Self-pay | Admitting: Internal Medicine

## 2018-04-11 DIAGNOSIS — N2581 Secondary hyperparathyroidism of renal origin: Secondary | ICD-10-CM | POA: Diagnosis not present

## 2018-04-11 DIAGNOSIS — N186 End stage renal disease: Secondary | ICD-10-CM | POA: Diagnosis not present

## 2018-04-14 DIAGNOSIS — N2581 Secondary hyperparathyroidism of renal origin: Secondary | ICD-10-CM | POA: Diagnosis not present

## 2018-04-14 DIAGNOSIS — N186 End stage renal disease: Secondary | ICD-10-CM | POA: Diagnosis not present

## 2018-04-15 ENCOUNTER — Other Ambulatory Visit: Payer: Self-pay | Admitting: Internal Medicine

## 2018-04-15 DIAGNOSIS — E1122 Type 2 diabetes mellitus with diabetic chronic kidney disease: Secondary | ICD-10-CM | POA: Diagnosis not present

## 2018-04-15 DIAGNOSIS — N186 End stage renal disease: Secondary | ICD-10-CM | POA: Diagnosis not present

## 2018-04-15 DIAGNOSIS — Z992 Dependence on renal dialysis: Secondary | ICD-10-CM | POA: Diagnosis not present

## 2018-04-17 ENCOUNTER — Other Ambulatory Visit: Payer: Self-pay | Admitting: Internal Medicine

## 2018-04-17 DIAGNOSIS — N186 End stage renal disease: Secondary | ICD-10-CM | POA: Diagnosis not present

## 2018-04-17 DIAGNOSIS — N2581 Secondary hyperparathyroidism of renal origin: Secondary | ICD-10-CM | POA: Diagnosis not present

## 2018-04-17 MED ORDER — AMLODIPINE BESYLATE 5 MG PO TABS: 5.0000 mg | ORAL_TABLET | Freq: Every day | ORAL | 2 refills | Status: DC

## 2018-04-17 NOTE — Telephone Encounter (Signed)
Lyrica refill

## 2018-04-19 DIAGNOSIS — N2581 Secondary hyperparathyroidism of renal origin: Secondary | ICD-10-CM | POA: Diagnosis not present

## 2018-04-19 DIAGNOSIS — N186 End stage renal disease: Secondary | ICD-10-CM | POA: Diagnosis not present

## 2018-04-21 DIAGNOSIS — N186 End stage renal disease: Secondary | ICD-10-CM | POA: Diagnosis not present

## 2018-04-21 DIAGNOSIS — N2581 Secondary hyperparathyroidism of renal origin: Secondary | ICD-10-CM | POA: Diagnosis not present

## 2018-04-24 ENCOUNTER — Telehealth: Payer: Self-pay

## 2018-04-24 DIAGNOSIS — N186 End stage renal disease: Secondary | ICD-10-CM | POA: Diagnosis not present

## 2018-04-24 DIAGNOSIS — N2581 Secondary hyperparathyroidism of renal origin: Secondary | ICD-10-CM | POA: Diagnosis not present

## 2018-04-24 NOTE — Telephone Encounter (Signed)
SENT REFERRAL TO SCHEDULING AND FILED NOTES 

## 2018-04-26 DIAGNOSIS — N2581 Secondary hyperparathyroidism of renal origin: Secondary | ICD-10-CM | POA: Diagnosis not present

## 2018-04-26 DIAGNOSIS — N186 End stage renal disease: Secondary | ICD-10-CM | POA: Diagnosis not present

## 2018-04-28 DIAGNOSIS — N2581 Secondary hyperparathyroidism of renal origin: Secondary | ICD-10-CM | POA: Diagnosis not present

## 2018-04-28 DIAGNOSIS — N186 End stage renal disease: Secondary | ICD-10-CM | POA: Diagnosis not present

## 2018-05-01 DIAGNOSIS — N2581 Secondary hyperparathyroidism of renal origin: Secondary | ICD-10-CM | POA: Diagnosis not present

## 2018-05-01 DIAGNOSIS — N186 End stage renal disease: Secondary | ICD-10-CM | POA: Diagnosis not present

## 2018-05-03 DIAGNOSIS — N186 End stage renal disease: Secondary | ICD-10-CM | POA: Diagnosis not present

## 2018-05-03 DIAGNOSIS — N2581 Secondary hyperparathyroidism of renal origin: Secondary | ICD-10-CM | POA: Diagnosis not present

## 2018-05-04 ENCOUNTER — Encounter: Payer: Self-pay | Admitting: Internal Medicine

## 2018-05-04 ENCOUNTER — Other Ambulatory Visit: Payer: Self-pay | Admitting: Internal Medicine

## 2018-05-04 ENCOUNTER — Ambulatory Visit (INDEPENDENT_AMBULATORY_CARE_PROVIDER_SITE_OTHER): Payer: BLUE CROSS/BLUE SHIELD | Admitting: Internal Medicine

## 2018-05-04 ENCOUNTER — Ambulatory Visit
Admission: RE | Admit: 2018-05-04 | Discharge: 2018-05-04 | Disposition: A | Discharge status: Home / Self Care | Payer: BLUE CROSS/BLUE SHIELD | Source: Ambulatory Visit | Attending: Internal Medicine | Admitting: Internal Medicine

## 2018-05-04 VITALS — BP 120/68 | HR 102 | Temp 98.1°F | Ht 63.0 in | Wt 225.8 lb

## 2018-05-04 DIAGNOSIS — R102 Pelvic and perineal pain: Secondary | ICD-10-CM

## 2018-05-04 NOTE — Progress Notes (Signed)
Subjective:     Patient ID: Cassandra Campos , female    DOB: Jan 01, 1971 , 47 y.o.   MRN: [id number]   Chief Complaint  Patient presents with  . Hip Pain    L side for around 6 weeks     HPI  L  Lateral hip pain x 2 month. Denies an injury. Denies doing anything for her symptoms. Pain is described as comes and goes, and gradually been getting worse and stays longer. Pain is described as an ache and sometimes sharp. Nothing makes it worse or better and has noticed does not hurt to walk, but when she lays on her L side feels the pain. She  Tends to sleep on her R side.   Past Medical History:  Diagnosis Date  . Anemia associated with chronic renal failure   . ASCUS of cervix with negative high risk HPV 06/2017  . CKD (chronic kidney disease), stage IV ([place]) no dialysis yet   nephrologist-  dr Jamal Maes-- scheduled next visit 09-08-2017 (lov 07-05-2017)  . Diabetic retinopathy ([place])   . Elevated transaminase level   . Fatty liver    FOLLOWED BY DR Henrene Pastor  . Hypertension   . Idiopathic gout of multiple sites    09-05-2017 per pt stable  . Insulin dependent type 2 diabetes mellitus ([place])    followed by dr Toy Care  . Mixed hyperlipidemia   . OSA (obstructive sleep apnea)    per study 10-20-2015 mild osa w/ AHI 10.3/hr;  09-05-2017 per pt has not used cpap since 1 yr ago  . Peripheral neuropathy    feet  . S/P arteriovenous (AV) fistula creation 07-04-2015  w/ revision 02-14-2017   left braniocephilic  . Trigger thumb, right thumb   . Umbilical hernia      Family History  Problem Relation Age of Onset  . Diabetes Mother   . Hypertension Mother   . Diabetes Father   . Hypertension Father   . Cancer Father        STOMACH  . Stomach cancer Father   . Stroke Maternal Grandmother   . Stroke Maternal Grandfather   . Colon cancer Neg Hx   . Rectal cancer Neg Hx      Current Outpatient Medications:  .  allopurinol (ZYLOPRIM) 300 MG tablet, Take 300 mg by mouth  every morning. , Disp: , Rfl: 5 .  amLODipine (NORVASC) 5 MG tablet, Take 1 tablet (5 mg total) by mouth daily., Disp: 90 tablet, Rfl: 2 .  B Complex-C-Folic Acid (DIALYVITE TABLET) TABS, Take by mouth., Disp: , Rfl:  .  colchicine 0.6 MG tablet, Take 1 tablet (0.6 mg total) by mouth 2 (two) times daily. Take BID for acute gout attack, stop when acute pain resolves (Patient taking differently: Take 0.6 mg by mouth 3 (three) times a week. Take BID for acute gout attack, stop when acute pain resolves), Disp: 60 tablet, Rfl: 3 .  cromolyn (OPTICROM) 4 % ophthalmic solution, INSTILL 1 DROP INTO BOTH EYES FOUR TIMES A DAY, Disp: , Rfl: 2 .  EASY COMFORT PEN NEEDLES 31G X 8 MM MISC, INJECT TWICE A DAY AS DIRECTED, Disp: , Rfl: 3 .  glucose blood (ONETOUCH VERIO) test strip, , Disp: , Rfl:  .  HUMULIN R 500 UNIT/ML injection, 180 units in the morning and 140 units in the evening before meals, Disp: 20 mL, Rfl: 11 .  lanthanum (FOSRENOL) 1000 MG chewable tablet, Chew 1,000 mg by mouth 3 (three)  times daily with meals., Disp: , Rfl:  .  lidocaine-prilocaine (EMLA) cream, USE ON ARAMS AS NEEDED FOR DIALYSIS, Disp: , Rfl: 10 .  nystatin-triamcinolone (MYCOLOG II) cream, Apply 1 application topically 2 (two) times daily., Disp: 30 g, Rfl: 2 .  pravastatin (PRAVACHOL) 40 MG tablet, TAKE 1 TABLET BY ORAL ROUTE EVERY DAY, Disp: 90 tablet, Rfl: 1 .  pregabalin (LYRICA) 150 MG capsule, TAKE ONE CAPSULE BY MOUTH ONCE DAILY, Disp: 90 capsule, Rfl: 0 .  RESTASIS 0.05 % ophthalmic emulsion, INSTILL 1 DROP INTO BOTH EYES TWICE A DAY, Disp: , Rfl: 3 .  Semaglutide,0.25 or 0.5MG /DOS, (OZEMPIC, 0.25 OR 0.5 MG/DOSE,) 2 MG/1.5ML SOPN, Inject 0.5 mg into the skin once a week., Disp: 1 pen, Rfl: 1 .  temazepam (RESTORIL) 15 MG capsule, TAKE ONE CAPSULE BY MOUTH EVERY NIGHT AT BEDTIME FOR SLEEP, Disp: , Rfl: 0   No Known Allergies   Review of Systems  Cardiovascular: Negative for leg swelling.  Musculoskeletal: Positive for  arthralgias. Negative for gait problem.  Skin: Negative for rash.       Has dry skin  Psychiatric/Behavioral: Nervous/anxious:       Today's Vitals   05/04/18 0943  BP: 120/68  Pulse: (!) 102  Temp: 98.1 F (36.7 C)  TempSrc: Oral  SpO2: 95%  Weight: 225 lb 12.8 oz (102.4 kg)  Height: 5\' 3"  (1.6 m)   Body mass index is 40 kg/m.   Objective:  Physical Exam Constitutional:      General: She is not in acute distress.    Appearance: She is obese.  HENT:     Head: Normocephalic.     Right Ear: External ear normal.     Left Ear: External ear normal.     Nose: Nose normal.  Eyes:     General: No scleral icterus.    Conjunctiva/sclera: Conjunctivae normal.  Neck:     Musculoskeletal: Neck supple.  Pulmonary:     Effort: Pulmonary effort is normal.  Musculoskeletal: Normal range of motion.     Comments: I was not able to provoke pain on her L hip with ROM. Leg length is equal.  BACK- normal ROM, neg SLR  Skin:    General: Skin is warm and dry.     Findings: No rash.  Neurological:     Mental Status: She is alert and oriented to person, place, and time.     Coordination: Coordination normal.     Gait: Gait normal.     Deep Tendon Reflexes: Reflexes normal.  Psychiatric:        Mood and Affect: Mood normal.        Behavior: Behavior normal.        Thought Content: Thought content normal.        Judgment: Judgment normal.     Assessment And Plan:  1. Pain in pelvis  - DG Pelvis Comp Min 3V; Future We will inform her when her xray is back.    Jairy Angulo RODRIGUEZ-SOUTHWORTH, PA-C

## 2018-05-05 DIAGNOSIS — N2581 Secondary hyperparathyroidism of renal origin: Secondary | ICD-10-CM | POA: Diagnosis not present

## 2018-05-05 DIAGNOSIS — N186 End stage renal disease: Secondary | ICD-10-CM | POA: Diagnosis not present

## 2018-05-07 DIAGNOSIS — N186 End stage renal disease: Secondary | ICD-10-CM | POA: Diagnosis not present

## 2018-05-07 DIAGNOSIS — N2581 Secondary hyperparathyroidism of renal origin: Secondary | ICD-10-CM | POA: Diagnosis not present

## 2018-05-09 DIAGNOSIS — N2581 Secondary hyperparathyroidism of renal origin: Secondary | ICD-10-CM | POA: Diagnosis not present

## 2018-05-09 DIAGNOSIS — N186 End stage renal disease: Secondary | ICD-10-CM | POA: Diagnosis not present

## 2018-05-12 DIAGNOSIS — N186 End stage renal disease: Secondary | ICD-10-CM | POA: Diagnosis not present

## 2018-05-12 DIAGNOSIS — N2581 Secondary hyperparathyroidism of renal origin: Secondary | ICD-10-CM | POA: Diagnosis not present

## 2018-05-14 DIAGNOSIS — N186 End stage renal disease: Secondary | ICD-10-CM | POA: Diagnosis not present

## 2018-05-14 DIAGNOSIS — N2581 Secondary hyperparathyroidism of renal origin: Secondary | ICD-10-CM | POA: Diagnosis not present

## 2018-05-16 DIAGNOSIS — N186 End stage renal disease: Secondary | ICD-10-CM | POA: Diagnosis not present

## 2018-05-16 DIAGNOSIS — N2581 Secondary hyperparathyroidism of renal origin: Secondary | ICD-10-CM | POA: Diagnosis not present

## 2018-05-16 DIAGNOSIS — E1122 Type 2 diabetes mellitus with diabetic chronic kidney disease: Secondary | ICD-10-CM | POA: Diagnosis not present

## 2018-05-16 DIAGNOSIS — Z992 Dependence on renal dialysis: Secondary | ICD-10-CM | POA: Diagnosis not present

## 2018-05-18 ENCOUNTER — Encounter: Payer: Self-pay | Admitting: Cardiovascular Disease

## 2018-05-18 ENCOUNTER — Ambulatory Visit (INDEPENDENT_AMBULATORY_CARE_PROVIDER_SITE_OTHER): Payer: BLUE CROSS/BLUE SHIELD | Admitting: Cardiovascular Disease

## 2018-05-18 DIAGNOSIS — R079 Chest pain, unspecified: Secondary | ICD-10-CM | POA: Diagnosis not present

## 2018-05-18 DIAGNOSIS — R0789 Other chest pain: Secondary | ICD-10-CM | POA: Diagnosis not present

## 2018-05-18 DIAGNOSIS — E781 Pure hyperglyceridemia: Secondary | ICD-10-CM | POA: Diagnosis not present

## 2018-05-18 DIAGNOSIS — I10 Essential (primary) hypertension: Secondary | ICD-10-CM | POA: Diagnosis not present

## 2018-05-18 NOTE — Patient Instructions (Signed)
Medication Instructions:  Your physician recommends that you continue on your current medications as directed. Please refer to the Current Medication list given to you today.  If you need a refill on your cardiac medications before your next appointment, please call your pharmacy.   Lab work: NONE If you have labs (blood work) drawn today and your tests are completely normal, you will receive your results only by: Marland Kitchen MyChart Message (if you have MyChart) OR . A paper copy in the mail If you have any lab test that is abnormal or we need to change your treatment, we will call you to review the results.  Testing/Procedures: Your physician has requested that you have a lexiscan myoview. For further information please visit [email or web address]. Please follow instruction sheet, as given.   Follow-Up: At [facility], you and your health needs are our priority.  As part of our continuing mission to provide you with exceptional heart care, we have created designated Provider Care Teams.  These Care Teams include your primary Cardiologist (physician) and Advanced Practice Providers (APPs -  Physician Assistants and Nurse Practitioners) who all work together to provide you with the care you need, when you need it. . You may schedule a follow up appointment AS NEEDED. You may see Dr. Gwenlyn Found or one of the following Advanced Practice Providers on your designated Care Team:   . Kerin Ransom, [place] . Almyra Deforest, PA-C . Fabian Sharp, PA-C . Jory Sims, DNP . Rosaria Ferries, PA-C . Roby Lofts, PA-C . Sande Rives, PA-C  Any Other Special Instructions Will Be Listed Below (If Applicable). REFERRAL TO LIPID CLINIC WITH DR. HILTY

## 2018-05-18 NOTE — Assessment & Plan Note (Signed)
History of essential hypertension blood pressure measured today at 148/84.  She is on amlodipine.  Continue current meds at current dosing.

## 2018-05-18 NOTE — Assessment & Plan Note (Signed)
Cassandra Campos was referred to me by Dr. Jimmy Footman for evaluation of atypical chest pain.  Risk factors include treated hypertension, diabetes and hyperlipidemia.  She does not smoke.  There is no family history.  She is never had a heart attack or stroke.  She has had 2 episodes of brief chest pain while on hemodialysis and one other episode while changing positions on her bed.  These do not necessarily sound anginal.  She is not very active and I do not think that she would adequately exercise.  I am going to get a pharmacologic Myoview stress test to further evaluate.

## 2018-05-18 NOTE — Progress Notes (Signed)
05/18/2018 [place]   1970/08/03  [id number]  Primary Physician Glendale Chard, MD Primary Cardiologist: Lorretta Harp MD [name], [place]  HPI:  Cassandra Campos is a 48 y.o. moderately overweight married African-American female mother of 2 children who works in the billing department and a Doctor, hospital.  She was referred to me by Dr. Jimmy Footman for cardiovascular valuation of atypical chest pain.  Risk factors include treated hypertension, diabetes and hyperlipidemia.  She does not smoke.  There is no family history.  She is never had a heart attack or stroke.  She has been on hemodialysis since August 2019.  She is being evaluated in [place] at [facility] for renal transplantation and in the upcoming weeks.  She is had several episodes of chest pain, 2 during dialysis and one while changing position in bed which do not sound ischemic in nature.   No outpatient medications have been marked as taking for the 05/18/18 encounter (Office Visit) with Lorretta Harp, MD.     No Known Allergies  Social History   Socioeconomic History  . Marital status: Married    Spouse name: Not on file  . Number of children: 2  . Years of education: Not on file  . Highest education level: Not on file  Occupational History  . Occupation: unemployed  Social Needs  . Financial resource strain: Not on file  . Food insecurity:    Worry: Not on file    Inability: Not on file  . Transportation needs:    Medical: Not on file    Non-medical: Not on file  Tobacco Use  . Smoking status: Never Smoker  . Smokeless tobacco: Never Used  Substance and Sexual Activity  . Alcohol use: Yes    Alcohol/week: 0.0 standard drinks    Comment: RARE  . Drug use: No  . Sexual activity: Yes    Birth control/protection: Surgical    Comment: HYST-1st intercourse 48 yo-Fewer than 5 partners  Lifestyle  . Physical activity:    Days per week: Not on file    Minutes per session: Not  on file  . Stress: Not on file  Relationships  . Social connections:    Talks on phone: Not on file    Gets together: Not on file    Attends religious service: Not on file    Active member of club or organization: Not on file    Attends meetings of clubs or organizations: Not on file    Relationship status: Not on file  . Intimate partner violence:    Fear of current or ex partner: Not on file    Emotionally abused: Not on file    Physically abused: Not on file    Forced sexual activity: Not on file  Other Topics Concern  . Not on file  Social History Narrative  . Not on file     Review of Systems: General: negative for chills, fever, night sweats or weight changes.  Cardiovascular: negative for chest pain, dyspnea on exertion, edema, orthopnea, palpitations, paroxysmal nocturnal dyspnea or shortness of breath Dermatological: negative for rash Respiratory: negative for cough or wheezing Urologic: negative for hematuria Abdominal: negative for nausea, vomiting, diarrhea, bright red blood per rectum, melena, or hematemesis Neurologic: negative for visual changes, syncope, or dizziness All other systems reviewed and are otherwise negative except as noted above.    Blood pressure (!) 148/84, pulse 96, height 5\' 3"  (1.6 m), weight 228 lb (103.4  kg).  General appearance: alert and no distress Neck: no adenopathy, no carotid bruit, no JVD, supple, symmetrical, trachea midline and thyroid not enlarged, symmetric, no tenderness/mass/nodules Lungs: clear to auscultation bilaterally Heart: regular rate and rhythm, S1, S2 normal, no murmur, click, rub or gallop Extremities: extremities normal, atraumatic, no cyanosis or edema Pulses: 2+ and symmetric Skin: Skin color, texture, turgor normal. No rashes or lesions Neurologic: Alert and oriented X 3, normal strength and tone. Normal symmetric reflexes. Normal coordination and gait  EKG not performed today  ASSESSMENT AND PLAN:    Hypertension History of essential hypertension blood pressure measured today at 148/84.  She is on amlodipine.  Continue current meds at current dosing.  High triglycerides History of hyperlipidemia and hypertriglyceridemia on Pravachol with lipid profile performed 12/22/2017 revealing a total cholesterol of 259, triglyceride level of 438.  Am going to refer her to Dr. Debara Pickett for further evaluation and treatment.  Atypical chest pain Ms. Lawhorn was referred to me by Dr. Jimmy Footman for evaluation of atypical chest pain.  Risk factors include treated hypertension, diabetes and hyperlipidemia.  She does not smoke.  There is no family history.  She is never had a heart attack or stroke.  She has had 2 episodes of brief chest pain while on hemodialysis and one other episode while changing positions on her bed.  These do not necessarily sound anginal.  She is not very active and I do not think that she would adequately exercise.  I am going to get a pharmacologic Myoview stress test to further evaluate.       Lorretta Harp MD FACP,FACC,FAHA, [facility] 05/18/2018 10:09 AM

## 2018-05-18 NOTE — Assessment & Plan Note (Signed)
History of hyperlipidemia and hypertriglyceridemia on Pravachol with lipid profile performed 12/22/2017 revealing a total cholesterol of 259, triglyceride level of 438.  Am going to refer her to Dr. Debara Pickett for further evaluation and treatment.

## 2018-05-19 DIAGNOSIS — N186 End stage renal disease: Secondary | ICD-10-CM | POA: Diagnosis not present

## 2018-05-19 DIAGNOSIS — N2581 Secondary hyperparathyroidism of renal origin: Secondary | ICD-10-CM | POA: Diagnosis not present

## 2018-05-22 DIAGNOSIS — N2581 Secondary hyperparathyroidism of renal origin: Secondary | ICD-10-CM | POA: Diagnosis not present

## 2018-05-22 DIAGNOSIS — N186 End stage renal disease: Secondary | ICD-10-CM | POA: Diagnosis not present

## 2018-05-23 ENCOUNTER — Telehealth (HOSPITAL_COMMUNITY): Payer: Self-pay

## 2018-05-23 NOTE — Telephone Encounter (Signed)
Encounter complete. 

## 2018-05-24 DIAGNOSIS — N186 End stage renal disease: Secondary | ICD-10-CM | POA: Diagnosis not present

## 2018-05-24 DIAGNOSIS — N2581 Secondary hyperparathyroidism of renal origin: Secondary | ICD-10-CM | POA: Diagnosis not present

## 2018-05-25 ENCOUNTER — Ambulatory Visit (HOSPITAL_COMMUNITY)
Admission: RE | Admit: 2018-05-25 | Discharge: 2018-05-25 | Disposition: A | Discharge status: Home / Self Care | Payer: BLUE CROSS/BLUE SHIELD | Source: Ambulatory Visit | Attending: Cardiovascular Disease | Admitting: Cardiovascular Disease

## 2018-05-25 DIAGNOSIS — R079 Chest pain, unspecified: Secondary | ICD-10-CM | POA: Diagnosis not present

## 2018-05-25 MED ORDER — REGADENOSON 0.4 MG/5ML IV SOLN
0.4000 mg | Freq: Once | INTRAVENOUS | Status: AC
  Administered 2018-05-25: 0.4 mg via INTRAVENOUS

## 2018-05-25 MED ORDER — TECHNETIUM TC 99M TETROFOSMIN IV KIT
31.0000 | PACK | Freq: Once | INTRAVENOUS | Status: AC | PRN
  Administered 2018-05-25: 31 via INTRAVENOUS
  Filled 2018-05-25: qty 31

## 2018-05-26 ENCOUNTER — Ambulatory Visit (HOSPITAL_COMMUNITY)
Admission: RE | Admit: 2018-05-26 | Discharge: 2018-05-26 | Disposition: A | Discharge status: Home / Self Care | Payer: BLUE CROSS/BLUE SHIELD | Source: Ambulatory Visit | Attending: Cardiovascular Disease | Admitting: Cardiovascular Disease

## 2018-05-26 ENCOUNTER — Other Ambulatory Visit: Payer: Self-pay | Admitting: Internal Medicine

## 2018-05-26 DIAGNOSIS — N186 End stage renal disease: Secondary | ICD-10-CM | POA: Diagnosis not present

## 2018-05-26 DIAGNOSIS — N2581 Secondary hyperparathyroidism of renal origin: Secondary | ICD-10-CM | POA: Diagnosis not present

## 2018-05-26 LAB — MYOCARDIAL PERFUSION IMAGING
CHL CUP NUCLEAR SDS: 0
CHL CUP NUCLEAR SSS: 0
CHL CUP RESTING HR STRESS: 93 {beats}/min
LV sys vol: 40 mL
LVDIAVOL: 95 mL (ref 46–106)
NUC STRESS TID: 0.87
Peak HR: 121 {beats}/min
SRS: 0

## 2018-05-26 MED ORDER — TECHNETIUM TC 99M TETROFOSMIN IV KIT
32.4000 | PACK | Freq: Once | INTRAVENOUS | Status: AC | PRN
  Administered 2018-05-26: 32.4 via INTRAVENOUS

## 2018-05-29 ENCOUNTER — Telehealth: Payer: Self-pay

## 2018-05-29 DIAGNOSIS — N186 End stage renal disease: Secondary | ICD-10-CM | POA: Diagnosis not present

## 2018-05-29 DIAGNOSIS — N2581 Secondary hyperparathyroidism of renal origin: Secondary | ICD-10-CM | POA: Diagnosis not present

## 2018-05-29 NOTE — Telephone Encounter (Signed)
Pt aware of results; results also released to [facility]

## 2018-05-31 DIAGNOSIS — N2581 Secondary hyperparathyroidism of renal origin: Secondary | ICD-10-CM | POA: Diagnosis not present

## 2018-05-31 DIAGNOSIS — N186 End stage renal disease: Secondary | ICD-10-CM | POA: Diagnosis not present

## 2018-06-01 DIAGNOSIS — N186 End stage renal disease: Secondary | ICD-10-CM | POA: Diagnosis not present

## 2018-06-01 DIAGNOSIS — Z794 Long term (current) use of insulin: Secondary | ICD-10-CM | POA: Diagnosis not present

## 2018-06-01 DIAGNOSIS — Z6841 Body Mass Index (BMI) 40.0 and over, adult: Secondary | ICD-10-CM | POA: Diagnosis not present

## 2018-06-01 DIAGNOSIS — Z7682 Awaiting organ transplant status: Secondary | ICD-10-CM | POA: Diagnosis not present

## 2018-06-01 DIAGNOSIS — E11319 Type 2 diabetes mellitus with unspecified diabetic retinopathy without macular edema: Secondary | ICD-10-CM | POA: Diagnosis not present

## 2018-06-01 DIAGNOSIS — E1140 Type 2 diabetes mellitus with diabetic neuropathy, unspecified: Secondary | ICD-10-CM | POA: Diagnosis not present

## 2018-06-01 DIAGNOSIS — Z79899 Other long term (current) drug therapy: Secondary | ICD-10-CM | POA: Diagnosis not present

## 2018-06-01 DIAGNOSIS — Z1159 Encounter for screening for other viral diseases: Secondary | ICD-10-CM | POA: Diagnosis not present

## 2018-06-01 DIAGNOSIS — Z114 Encounter for screening for human immunodeficiency virus [HIV]: Secondary | ICD-10-CM | POA: Diagnosis not present

## 2018-06-01 DIAGNOSIS — Z992 Dependence on renal dialysis: Secondary | ICD-10-CM | POA: Diagnosis not present

## 2018-06-01 DIAGNOSIS — E1122 Type 2 diabetes mellitus with diabetic chronic kidney disease: Secondary | ICD-10-CM | POA: Diagnosis not present

## 2018-06-01 DIAGNOSIS — K439 Ventral hernia without obstruction or gangrene: Secondary | ICD-10-CM | POA: Diagnosis not present

## 2018-06-02 DIAGNOSIS — N186 End stage renal disease: Secondary | ICD-10-CM | POA: Diagnosis not present

## 2018-06-02 DIAGNOSIS — N2581 Secondary hyperparathyroidism of renal origin: Secondary | ICD-10-CM | POA: Diagnosis not present

## 2018-06-03 DIAGNOSIS — N186 End stage renal disease: Secondary | ICD-10-CM | POA: Insufficient documentation

## 2018-06-05 DIAGNOSIS — N186 End stage renal disease: Secondary | ICD-10-CM | POA: Diagnosis not present

## 2018-06-05 DIAGNOSIS — N2581 Secondary hyperparathyroidism of renal origin: Secondary | ICD-10-CM | POA: Diagnosis not present

## 2018-06-07 DIAGNOSIS — N2581 Secondary hyperparathyroidism of renal origin: Secondary | ICD-10-CM | POA: Diagnosis not present

## 2018-06-07 DIAGNOSIS — N186 End stage renal disease: Secondary | ICD-10-CM | POA: Diagnosis not present

## 2018-06-09 DIAGNOSIS — N2581 Secondary hyperparathyroidism of renal origin: Secondary | ICD-10-CM | POA: Diagnosis not present

## 2018-06-09 DIAGNOSIS — N186 End stage renal disease: Secondary | ICD-10-CM | POA: Diagnosis not present

## 2018-06-12 DIAGNOSIS — N2581 Secondary hyperparathyroidism of renal origin: Secondary | ICD-10-CM | POA: Diagnosis not present

## 2018-06-12 DIAGNOSIS — N186 End stage renal disease: Secondary | ICD-10-CM | POA: Diagnosis not present

## 2018-06-14 DIAGNOSIS — N2581 Secondary hyperparathyroidism of renal origin: Secondary | ICD-10-CM | POA: Diagnosis not present

## 2018-06-14 DIAGNOSIS — N186 End stage renal disease: Secondary | ICD-10-CM | POA: Diagnosis not present

## 2018-06-16 DIAGNOSIS — Z992 Dependence on renal dialysis: Secondary | ICD-10-CM | POA: Diagnosis not present

## 2018-06-16 DIAGNOSIS — E1122 Type 2 diabetes mellitus with diabetic chronic kidney disease: Secondary | ICD-10-CM | POA: Diagnosis not present

## 2018-06-16 DIAGNOSIS — N186 End stage renal disease: Secondary | ICD-10-CM | POA: Diagnosis not present

## 2018-06-16 DIAGNOSIS — N2581 Secondary hyperparathyroidism of renal origin: Secondary | ICD-10-CM | POA: Diagnosis not present

## 2018-06-19 DIAGNOSIS — N186 End stage renal disease: Secondary | ICD-10-CM | POA: Diagnosis not present

## 2018-06-19 DIAGNOSIS — N2581 Secondary hyperparathyroidism of renal origin: Secondary | ICD-10-CM | POA: Diagnosis not present

## 2018-06-20 ENCOUNTER — Encounter: Payer: Self-pay | Admitting: Internal Medicine

## 2018-06-21 DIAGNOSIS — N2581 Secondary hyperparathyroidism of renal origin: Secondary | ICD-10-CM | POA: Diagnosis not present

## 2018-06-21 DIAGNOSIS — N186 End stage renal disease: Secondary | ICD-10-CM | POA: Diagnosis not present

## 2018-06-23 DIAGNOSIS — N2581 Secondary hyperparathyroidism of renal origin: Secondary | ICD-10-CM | POA: Diagnosis not present

## 2018-06-23 DIAGNOSIS — N186 End stage renal disease: Secondary | ICD-10-CM | POA: Diagnosis not present

## 2018-06-26 DIAGNOSIS — N186 End stage renal disease: Secondary | ICD-10-CM | POA: Diagnosis not present

## 2018-06-26 DIAGNOSIS — N2581 Secondary hyperparathyroidism of renal origin: Secondary | ICD-10-CM | POA: Diagnosis not present

## 2018-06-28 ENCOUNTER — Ambulatory Visit: Payer: BLUE CROSS/BLUE SHIELD | Admitting: Internal Medicine

## 2018-06-28 DIAGNOSIS — N2581 Secondary hyperparathyroidism of renal origin: Secondary | ICD-10-CM | POA: Diagnosis not present

## 2018-06-28 DIAGNOSIS — N186 End stage renal disease: Secondary | ICD-10-CM | POA: Diagnosis not present

## 2018-06-29 DIAGNOSIS — N186 End stage renal disease: Secondary | ICD-10-CM | POA: Diagnosis not present

## 2018-06-29 DIAGNOSIS — N2581 Secondary hyperparathyroidism of renal origin: Secondary | ICD-10-CM | POA: Diagnosis not present

## 2018-07-03 DIAGNOSIS — N2581 Secondary hyperparathyroidism of renal origin: Secondary | ICD-10-CM | POA: Diagnosis not present

## 2018-07-03 DIAGNOSIS — N186 End stage renal disease: Secondary | ICD-10-CM | POA: Diagnosis not present

## 2018-07-03 DIAGNOSIS — R51 Headache: Secondary | ICD-10-CM | POA: Diagnosis not present

## 2018-07-04 ENCOUNTER — Telehealth: Payer: Self-pay | Admitting: Cardiovascular Disease

## 2018-07-04 ENCOUNTER — Ambulatory Visit (INDEPENDENT_AMBULATORY_CARE_PROVIDER_SITE_OTHER): Payer: BLUE CROSS/BLUE SHIELD | Admitting: Cardiovascular Disease

## 2018-07-04 ENCOUNTER — Encounter: Payer: Self-pay | Admitting: Cardiovascular Disease

## 2018-07-04 VITALS — BP 124/70 | HR 104 | Ht 63.0 in | Wt 231.4 lb

## 2018-07-04 DIAGNOSIS — Z7682 Awaiting organ transplant status: Secondary | ICD-10-CM

## 2018-07-04 DIAGNOSIS — I10 Essential (primary) hypertension: Secondary | ICD-10-CM

## 2018-07-04 DIAGNOSIS — G4733 Obstructive sleep apnea (adult) (pediatric): Secondary | ICD-10-CM

## 2018-07-04 DIAGNOSIS — R0789 Other chest pain: Secondary | ICD-10-CM

## 2018-07-04 DIAGNOSIS — R002 Palpitations: Secondary | ICD-10-CM | POA: Insufficient documentation

## 2018-07-04 DIAGNOSIS — E781 Pure hyperglyceridemia: Secondary | ICD-10-CM | POA: Diagnosis not present

## 2018-07-04 NOTE — Assessment & Plan Note (Signed)
History of atypical chest pain with recent Myoview performed 05/25/2018 which was entirely normal.  Her pain is markedly improved.

## 2018-07-04 NOTE — Telephone Encounter (Signed)
Spoke to patient. She states that since Saturday  - fluttering sensation Off/on she states it has become more frequent and when it occurs it make her cough.- patient states it occurred last night while at diaylsis - and als she develop a headache.   No new  Changes  Have not start any new medications or increase the intake of caffeine.  appointment schedule for today at 11:15 am.

## 2018-07-04 NOTE — Progress Notes (Signed)
07/04/2018 [place]   1970/07/17  [id number]  Primary Physician Cassandra Chard, MD Primary Cardiologist: Cassandra Harp MD Cassandra Campos, [place]  HPI:  Cassandra Campos is a 48 y.o.  moderately overweight married African-American female mother of 2 children who works in the billing department and a Doctor, hospital.  She was referred to me by Dr. Jimmy Campos for cardiovascular valuation of atypical chest pain.  I last saw her in the office 05/18/2018. Risk factors include treated hypertension, diabetes and hyperlipidemia.  She does not smoke.  There is no family history.  She is never had a heart attack or stroke.  She has been on hemodialysis since August 2019.  She is being evaluated in [place] at [facility] for renal transplantation and in the upcoming weeks.  She is had several episodes of chest pain, 2 during dialysis and one while changing position in bed which do not sound ischemic in nature.  Since I saw her back a month ago she did have a Myoview stress test performed 05/25/2018 which was entirely normal.  Her pain is significantly improved.  She does complain of occasional palpable palpitations and had a PVC on her 12-lead EKG.  She is also had obstructive sleep apnea on CPAP in the past which she is no longer on.   Current Meds  Medication Sig  . allopurinol (ZYLOPRIM) 300 MG tablet Take 300 mg by mouth every morning.   Marland Kitchen amLODipine (NORVASC) 5 MG tablet Take 1 tablet (5 mg total) by mouth daily.  . B Complex-C-Folic Acid (DIALYVITE TABLET) TABS Take by mouth.  . colchicine 0.6 MG tablet Take 1 tablet (0.6 mg total) by mouth 2 (two) times daily. Take BID for acute gout attack, stop when acute pain resolves (Patient taking differently: Take 0.6 mg by mouth 3 (three) times a week. Take BID for acute gout attack, stop when acute pain resolves)  . cromolyn (OPTICROM) 4 % ophthalmic solution INSTILL 1 DROP INTO BOTH EYES FOUR TIMES A DAY  . EASY COMFORT PEN NEEDLES  31G X 8 MM MISC INJECT TWICE A DAY AS DIRECTED  . glucose blood (ONETOUCH VERIO) test strip   . HUMULIN R 500 UNIT/ML injection 180 units in the morning and 140 units in the evening before meals  . lanthanum (FOSRENOL) 1000 MG chewable tablet Chew 1,000 mg by mouth 3 (three) times daily with meals.  . lidocaine-prilocaine (EMLA) cream USE ON ARAMS AS NEEDED FOR DIALYSIS  . nystatin-triamcinolone (MYCOLOG II) cream Apply 1 application topically 2 (two) times daily.  Marland Kitchen OZEMPIC, 0.25 OR 0.5 MG/DOSE, 2 MG/1.5ML SOPN INJECT 0.5 MG INTO THE SKIN ONCE A WEEK.  . pravastatin (PRAVACHOL) 40 MG tablet TAKE 1 TABLET BY ORAL ROUTE EVERY DAY  . pregabalin (LYRICA) 150 MG capsule TAKE ONE CAPSULE BY MOUTH ONCE DAILY  . RESTASIS 0.05 % ophthalmic emulsion INSTILL 1 DROP INTO BOTH EYES TWICE A DAY  . temazepam (RESTORIL) 15 MG capsule TAKE ONE CAPSULE BY MOUTH EVERY NIGHT AT BEDTIME FOR SLEEP     No Known Allergies  Social History   Socioeconomic History  . Marital status: Married    Spouse name: Not on file  . Number of children: 2  . Years of education: Not on file  . Highest education level: Not on file  Occupational History  . Occupation: unemployed  Social Needs  . Financial resource strain: Not on file  . Food insecurity:    Worry: Not on file  Inability: Not on file  . Transportation needs:    Medical: Not on file    Non-medical: Not on file  Tobacco Use  . Smoking status: Never Smoker  . Smokeless tobacco: Never Used  Substance and Sexual Activity  . Alcohol use: Yes    Alcohol/week: 0.0 standard drinks    Comment: RARE  . Drug use: No  . Sexual activity: Yes    Birth control/protection: Surgical    Comment: HYST-1st intercourse 48 yo-Fewer than 5 partners  Lifestyle  . Physical activity:    Days per week: Not on file    Minutes per session: Not on file  . Stress: Not on file  Relationships  . Social connections:    Talks on phone: Not on file    Gets together: Not on  file    Attends religious service: Not on file    Active member of club or organization: Not on file    Attends meetings of clubs or organizations: Not on file    Relationship status: Not on file  . Intimate partner violence:    Fear of current or ex partner: Not on file    Emotionally abused: Not on file    Physically abused: Not on file    Forced sexual activity: Not on file  Other Topics Concern  . Not on file  Social History Narrative  . Not on file     Review of Systems: General: negative for chills, fever, night sweats or weight changes.  Cardiovascular: negative for chest pain, dyspnea on exertion, edema, orthopnea, palpitations, paroxysmal nocturnal dyspnea or shortness of breath Dermatological: negative for rash Respiratory: negative for cough or wheezing Urologic: negative for hematuria Abdominal: negative for nausea, vomiting, diarrhea, bright red blood per rectum, melena, or hematemesis Neurologic: negative for visual changes, syncope, or dizziness All other systems reviewed and are otherwise negative except as noted above.    Blood pressure 124/70, pulse (!) 104, height 5\' 3"  (1.6 m), weight 231 lb 6.4 oz (105 kg).  General appearance: alert and no distress Neck: no adenopathy, no carotid bruit, no JVD, supple, symmetrical, trachea midline and thyroid not enlarged, symmetric, no tenderness/mass/nodules Lungs: clear to auscultation bilaterally Heart: regular rate and rhythm, S1, S2 normal, no murmur, click, rub or gallop Extremities: extremities normal, atraumatic, no cyanosis or edema Pulses: 2+ and symmetric Skin: Skin color, texture, turgor normal. No rashes or lesions Neurologic: Alert and oriented X 3, normal strength and tone. Normal symmetric reflexes. Normal coordination and gait  EKG sinus tachycardia 104 with a unifocal PVC and poor R wave progression.  I personally reviewed this EKG.   ASSESSMENT AND PLAN:   High triglycerides She of hyperlipidemia on  Pravachol as well as hypertriglyceridemia with recent lipid profile performed 12/22/2017 revealing total cholesterol 259, triglyceride level of 438.  She would benefit from being either on fenofibrate or for Vascepa.  I am going to refer her to Dr. Debara Pickett for further evaluation..  Hypertension History of essential hypertension with blood pressure measured at 124/70.  She is on amlodipine.  Continue current meds at current dosing.  Atypical chest pain History of atypical chest pain with recent Myoview performed 05/25/2018 which was entirely normal.  Her pain is markedly improved.  Obstructive sleep apnea History of obstructive sleep apnea on CPAP in the past.  Palpitations Recent onset palpitations with a EKG that shows a unifocal PVC.  I suspect her palpitations are related to PVCs.  I am going to put a 2-week  ZIO patch on to further evaluate      Cassandra Harp MD [facility], [facility] 07/04/2018 11:32 AM

## 2018-07-04 NOTE — Assessment & Plan Note (Signed)
Recent onset palpitations with a EKG that shows a unifocal PVC.  I suspect her palpitations are related to PVCs.  I am going to put a 2-week ZIO patch on to further evaluate

## 2018-07-04 NOTE — Assessment & Plan Note (Signed)
History of obstructive sleep apnea on CPAP in the past.

## 2018-07-04 NOTE — Assessment & Plan Note (Signed)
History of essential hypertension with blood pressure measured at 124/70.  She is on amlodipine.  Continue current meds at current dosing.

## 2018-07-04 NOTE — Telephone Encounter (Signed)
  Patient c/o Palpitations:  High priority if patient c/o lightheadedness, shortness of breath, or chest pain  1) How long have you had palpitations/irregular HR/ Afib? Are you having the symptoms now? Since Friday and she is having them now  2) Are you currently experiencing lightheadedness, SOB or CP? no  3) Do you have a history of afib (atrial fibrillation) or irregular heart rhythm? no  4) Have you checked your BP or HR? (document readings if available): not today  5) Are you experiencing any other symptoms? She has a cough when it happens

## 2018-07-04 NOTE — Assessment & Plan Note (Signed)
She of hyperlipidemia on Pravachol as well as hypertriglyceridemia with recent lipid profile performed 12/22/2017 revealing total cholesterol 259, triglyceride level of 438.  She would benefit from being either on fenofibrate or for Vascepa.  I am going to refer her to Dr. Debara Pickett for further evaluation.Cassandra Campos

## 2018-07-04 NOTE — Patient Instructions (Signed)
Medication Instructions:  Your physician recommends that you continue on your current medications as directed. Please refer to the Current Medication list given to you today.  If you need a refill on your cardiac medications before your next appointment, please call your pharmacy.   Lab work: NONE If you have labs (blood work) drawn today and your tests are completely normal, you will receive your results only by: Marland Kitchen MyChart Message (if you have MyChart) OR . A paper copy in the mail If you have any lab test that is abnormal or we need to change your treatment, we will call you to review the results.  Testing/Procedures: Your physician has requested that you have an echocardiogram. Echocardiography is a painless test that uses sound waves to create images of your heart. It provides your doctor with information about the size and shape of your heart and how well your heart's chambers and valves are working. This procedure takes approximately one hour. There are no restrictions for this procedure.  Your physician has recommended that you wear a 14 DAY ZIO-PATCH monitor. The Zio patch cardiac monitor continuously records heart rhythm data for up to 14 days, this is for patients being evaluated for multiple types heart rhythms. For the first 24 hours post application, please avoid getting the Zio monitor wet in the shower or by excessive sweating during exercise. After that, feel free to carry on with regular activities. Keep soaps and lotions away from the ZIO XT Patch.  This will be placed at our [facility] location - [address].        Follow-Up: At [facility], you and your health needs are our priority.  As part of our continuing mission to provide you with exceptional heart care, we have created designated Provider Care Teams.  These Care Teams include your primary Cardiologist (physician) and Advanced Practice Providers (APPs -  Physician Assistants and Nurse Practitioners)  who all work together to provide you with the care you need, when you need it. . You will need a follow up appointment in 6 months WITH AN APP AND IN 12 MONTHS WITH DR BERRY.  Please call our office 2 months in advance to schedule this appointment.  You may see Dr. Gwenlyn Found or one of the following Advanced Practice Providers on your designated Care Team:   . Kerin Ransom, [place] . Almyra Deforest, PA-C . Fabian Sharp, PA-C . Jory Sims, DNP . Rosaria Ferries, PA-C . Roby Lofts, PA-C . Sande Rives, PA-C  Any Other Special Instructions Will Be Listed Below (If Applicable). REFERRAL TO DR. HILTY IN THE LIPID DISORDERS CLINIC

## 2018-07-05 ENCOUNTER — Other Ambulatory Visit: Payer: Self-pay | Admitting: Internal Medicine

## 2018-07-05 DIAGNOSIS — R51 Headache: Secondary | ICD-10-CM | POA: Diagnosis not present

## 2018-07-05 DIAGNOSIS — N2581 Secondary hyperparathyroidism of renal origin: Secondary | ICD-10-CM | POA: Diagnosis not present

## 2018-07-05 DIAGNOSIS — N186 End stage renal disease: Secondary | ICD-10-CM | POA: Diagnosis not present

## 2018-07-05 NOTE — Telephone Encounter (Signed)
Pregabalin 150 mg refill

## 2018-07-07 DIAGNOSIS — N186 End stage renal disease: Secondary | ICD-10-CM | POA: Diagnosis not present

## 2018-07-07 DIAGNOSIS — N2581 Secondary hyperparathyroidism of renal origin: Secondary | ICD-10-CM | POA: Diagnosis not present

## 2018-07-07 DIAGNOSIS — R51 Headache: Secondary | ICD-10-CM | POA: Diagnosis not present

## 2018-07-10 DIAGNOSIS — N2581 Secondary hyperparathyroidism of renal origin: Secondary | ICD-10-CM | POA: Diagnosis not present

## 2018-07-10 DIAGNOSIS — N186 End stage renal disease: Secondary | ICD-10-CM | POA: Diagnosis not present

## 2018-07-11 ENCOUNTER — Other Ambulatory Visit: Payer: Self-pay

## 2018-07-11 ENCOUNTER — Encounter: Payer: Self-pay | Admitting: Internal Medicine

## 2018-07-11 ENCOUNTER — Ambulatory Visit (INDEPENDENT_AMBULATORY_CARE_PROVIDER_SITE_OTHER): Payer: BLUE CROSS/BLUE SHIELD | Admitting: Internal Medicine

## 2018-07-11 VITALS — BP 110/64 | HR 105 | Temp 98.2°F | Ht 62.4 in | Wt 226.6 lb

## 2018-07-11 DIAGNOSIS — G4733 Obstructive sleep apnea (adult) (pediatric): Secondary | ICD-10-CM

## 2018-07-11 DIAGNOSIS — Z6841 Body Mass Index (BMI) 40.0 and over, adult: Secondary | ICD-10-CM

## 2018-07-11 DIAGNOSIS — E1165 Type 2 diabetes mellitus with hyperglycemia: Secondary | ICD-10-CM | POA: Diagnosis not present

## 2018-07-11 DIAGNOSIS — N186 End stage renal disease: Secondary | ICD-10-CM

## 2018-07-11 DIAGNOSIS — E785 Hyperlipidemia, unspecified: Secondary | ICD-10-CM

## 2018-07-11 DIAGNOSIS — I150 Renovascular hypertension: Secondary | ICD-10-CM | POA: Diagnosis not present

## 2018-07-11 DIAGNOSIS — E1122 Type 2 diabetes mellitus with diabetic chronic kidney disease: Secondary | ICD-10-CM | POA: Diagnosis not present

## 2018-07-11 DIAGNOSIS — R002 Palpitations: Secondary | ICD-10-CM

## 2018-07-11 DIAGNOSIS — IMO0002 Reserved for concepts with insufficient information to code with codable children: Secondary | ICD-10-CM

## 2018-07-11 DIAGNOSIS — E781 Pure hyperglyceridemia: Secondary | ICD-10-CM

## 2018-07-11 DIAGNOSIS — Z992 Dependence on renal dialysis: Secondary | ICD-10-CM

## 2018-07-11 NOTE — Patient Instructions (Signed)
Palpitations  Palpitations are feelings that your heartbeat is not normal. Your heartbeat may feel like it is:   Uneven.   Faster than normal.   Fluttering.   Skipping a beat.  This is usually not a serious problem. In some cases, you may need tests to rule out any serious problems.  Follow these instructions at home:  Pay attention to any changes in your condition. Take these actions to help manage your symptoms:  Eating and drinking   Avoid:  ? Coffee, tea, soft drinks, and energy drinks.  ? Chocolate.  ? Alcohol.  ? Diet pills.  Lifestyle     Try to lower your stress. These things can help you relax:  ? Yoga.  ? Deep breathing and meditation.  ? Exercise.  ? Using words and images to create positive thoughts (guided imagery).  ? Using your mind to control things in your body (biofeedback).   Do not use drugs.   Get plenty of rest and sleep. Keep a regular bed time.  General instructions     Take over-the-counter and prescription medicines only as told by your doctor.   Do not use any products that contain nicotine or tobacco, such as cigarettes and e-cigarettes. If you need help quitting, ask your doctor.   Keep all follow-up visits as told by your doctor. This is important. You may need more tests if palpitations do not go away or get worse.  Contact a doctor if:   Your symptoms last more than 24 hours.   Your symptoms occur more often.  Get help right away if you:   Have chest pain.   Feel short of breath.   Have a very bad headache.   Feel dizzy.   Pass out (faint).  Summary   Palpitations are feelings that your heartbeat is uneven or faster than normal. It may feel like your heart is fluttering or skipping a beat.   Avoid food and drinks that may cause palpitations. These include caffeine, chocolate, and alcohol.   Try to lower your stress. Do not smoke or use drugs.   Get help right away if you faint or have chest pain, shortness of breath, a severe headache, or dizziness.  This  information is not intended to replace advice given to you by your health care provider. Make sure you discuss any questions you have with your health care provider.  Document Released: 02/10/2008 Document Revised: 06/15/2017 Document Reviewed: 06/15/2017  Elsevier Interactive Patient Education  2019 Elsevier Inc.

## 2018-07-11 NOTE — Progress Notes (Signed)
Subjective:     Patient ID: Cassandra Campos , female    DOB: 19-Jun-1970 , 48 y.o.   MRN: [id number]   Chief Complaint  Patient presents with  . Diabetes  . Hypertension    HPI  Diabetes  She presents for her follow-up diabetic visit. She has type 2 diabetes mellitus. Her disease course has been stable. There are no hypoglycemic associated symptoms. Pertinent negatives for diabetes include no blurred vision and no chest pain. There are no hypoglycemic complications. Risk factors for coronary artery disease include dyslipidemia, hypertension, diabetes mellitus, post-menopausal, sedentary lifestyle and obesity. Her home blood glucose trend is fluctuating minimally. Her breakfast blood glucose is taken between 9-10 am. Her breakfast blood glucose range is generally 140-180 mg/dl.  Hypertension  This is a chronic problem. The current episode started more than 1 year ago. The problem has been gradually improving since onset. The problem is controlled. Associated symptoms include palpitations (she c/o palpitations. recurrent. no associated chest pain. random. occurs at rest. ). Pertinent negatives include no blurred vision, chest pain or shortness of breath.  Reports compliance with meds.    Past Medical History:  Diagnosis Date  . Anemia associated with chronic renal failure   . ASCUS of cervix with negative high risk HPV 06/2017  . CKD (chronic kidney disease), stage IV ([place]) no dialysis yet   nephrologist-  dr Jamal Maes-- scheduled next visit 09-08-2017 (lov 07-05-2017)  . Diabetic retinopathy ([place])   . Elevated transaminase level   . Fatty liver    FOLLOWED BY DR Henrene Pastor  . Hypertension   . Idiopathic gout of multiple sites    09-05-2017 per pt stable  . Insulin dependent type 2 diabetes mellitus ([place])    followed by dr Toy Care  . Mixed hyperlipidemia   . OSA (obstructive sleep apnea)    per study 10-20-2015 mild osa w/ AHI 10.3/hr;  09-05-2017 per pt has not used cpap since  1 yr ago  . Peripheral neuropathy    feet  . S/P arteriovenous (AV) fistula creation 07-04-2015  w/ revision 02-14-2017   left braniocephilic  . Trigger thumb, right thumb   . Umbilical hernia      Family History  Problem Relation Age of Onset  . Diabetes Mother   . Hypertension Mother   . Diabetes Father   . Hypertension Father   . Cancer Father        STOMACH  . Stomach cancer Father   . Stroke Maternal Grandmother   . Stroke Maternal Grandfather   . Colon cancer Neg Hx   . Rectal cancer Neg Hx      Current Outpatient Medications:  .  allopurinol (ZYLOPRIM) 300 MG tablet, Take 300 mg by mouth every morning. , Disp: , Rfl: 5 .  amLODipine (NORVASC) 5 MG tablet, Take 1 tablet (5 mg total) by mouth daily., Disp: 90 tablet, Rfl: 2 .  B Complex-C-Folic Acid (DIALYVITE TABLET) TABS, Take by mouth., Disp: , Rfl:  .  colchicine 0.6 MG tablet, Take 1 tablet (0.6 mg total) by mouth 2 (two) times daily. Take BID for acute gout attack, stop when acute pain resolves (Patient taking differently: Take 0.6 mg by mouth 3 (three) times a week. Take BID for acute gout attack, stop when acute pain resolves), Disp: 60 tablet, Rfl: 3 .  cromolyn (OPTICROM) 4 % ophthalmic solution, INSTILL 1 DROP INTO BOTH EYES FOUR TIMES A DAY, Disp: , Rfl: 2 .  EASY COMFORT PEN NEEDLES 31G  X 8 MM MISC, INJECT TWICE A DAY AS DIRECTED, Disp: , Rfl: 3 .  glucose blood (ONETOUCH VERIO) test strip, , Disp: , Rfl:  .  HUMULIN R 500 UNIT/ML injection, 180 units in the morning and 140 units in the evening before meals, Disp: 20 mL, Rfl: 11 .  lanthanum (FOSRENOL) 1000 MG chewable tablet, Chew 1,000 mg by mouth 3 (three) times daily with meals., Disp: , Rfl:  .  lidocaine-prilocaine (EMLA) cream, USE ON ARAMS AS NEEDED FOR DIALYSIS, Disp: , Rfl: 10 .  nystatin-triamcinolone (MYCOLOG II) cream, Apply 1 application topically 2 (two) times daily., Disp: 30 g, Rfl: 2 .  OZEMPIC, 0.25 OR 0.5 MG/DOSE, 2 MG/1.5ML SOPN, INJECT 0.5  MG INTO THE SKIN ONCE A WEEK., Disp: 1 pen, Rfl: 1 .  pravastatin (PRAVACHOL) 40 MG tablet, TAKE 1 TABLET BY ORAL ROUTE EVERY DAY, Disp: 90 tablet, Rfl: 1 .  pregabalin (LYRICA) 150 MG capsule, TAKE ONE CAPSULE BY MOUTH ONCE DAILY, Disp: 90 capsule, Rfl: 2 .  RESTASIS 0.05 % ophthalmic emulsion, INSTILL 1 DROP INTO BOTH EYES TWICE A DAY, Disp: , Rfl: 3 .  temazepam (RESTORIL) 15 MG capsule, TAKE ONE CAPSULE BY MOUTH EVERY NIGHT AT BEDTIME FOR SLEEP, Disp: , Rfl: 0   No Known Allergies   Review of Systems  Constitutional: Negative.   Eyes: Negative for blurred vision.  Respiratory: Negative.  Negative for shortness of breath.   Cardiovascular: Positive for palpitations (she c/o palpitations. recurrent. no associated chest pain. random. occurs at rest. ). Negative for chest pain.  Gastrointestinal: Negative.   Neurological: Negative.   Psychiatric/Behavioral: Negative.      Today's Vitals   07/11/18 1030  BP: 110/64  Pulse: (!) 105  Temp: 98.2 F (36.8 C)  TempSrc: Oral  Weight: 226 lb 9.6 oz (102.8 kg)  Height: 5' 2.4" (1.585 m)   Body mass index is 40.92 kg/m.   Objective:  Physical Exam Vitals signs and nursing note reviewed.  Constitutional:      Appearance: Normal appearance.  HENT:     Head: Normocephalic and atraumatic.  Cardiovascular:     Rate and Rhythm: Regular rhythm. Tachycardia present.     Heart sounds: Normal heart sounds.  Pulmonary:     Effort: Pulmonary effort is normal.     Breath sounds: Normal breath sounds.  Skin:    General: Skin is warm.  Neurological:     General: No focal deficit present.     Mental Status: She is alert.  Psychiatric:        Mood and Affect: Mood normal.        Behavior: Behavior normal.         Assessment And Plan:     1. Uncontrolled type 2 diabetes mellitus with ESRD (end-stage renal disease) ([place])  I will check labs as listed below. I will adjust meds as needed. Importance of dietary compliance was discussed  with the patient.   - Lipid panel - TSH - Hemoglobin A1c  2. Renovascular hypertension  Well controlled. She will continue with current meds.   3. OSA (obstructive sleep apnea)  She has been diagnosed with OSA in the past. However, she was taken off of CPAP due to noncompliance. Risks associated with untreated OSA was discussed with the patient in full detail.   4. Palpitations  Recurrent. I will check her thyroid function.  She is encouraged to stay well hydrated.  However, given her risk factors for OSA - I think  a repeat sleep study is needed.   5. Class 3 severe obesity due to excess calories with serious comorbidity and body mass index (BMI) of 40.0 to 44.9 in adult [facility])  Importance of achieving optimal weight to decrease risk of cardiovascular disease and cancers was discussed with the patient in full detail. She is encouraged to start slowly - start with 10 minutes twice daily at least three to four days per week and to gradually build to 30 minutes five days weekly. She was given tips to incorporate more activity into her daily routine - take stairs when possible, park farther away from her job, grocery stores, etc.   6. End stage renal failure on dialysis ([facility])   Maximino Greenland, MD

## 2018-07-11 NOTE — Addendum Note (Signed)
Addended by: Annita Brod on: 07/11/2018 05:36 PM   Modules accepted: Orders

## 2018-07-12 ENCOUNTER — Encounter: Payer: BLUE CROSS/BLUE SHIELD | Admitting: Gynecology

## 2018-07-12 DIAGNOSIS — N186 End stage renal disease: Secondary | ICD-10-CM | POA: Diagnosis not present

## 2018-07-12 DIAGNOSIS — N2581 Secondary hyperparathyroidism of renal origin: Secondary | ICD-10-CM | POA: Diagnosis not present

## 2018-07-12 LAB — LIPID PANEL
CHOLESTEROL TOTAL: 188 mg/dL (ref 100–199)
Chol/HDL Ratio: 4.4 ratio (ref 0.0–4.4)
HDL: 43 mg/dL (ref >39)
LDL CALC: 95 mg/dL (ref 0–99)
TRIGLYCERIDES: 251 mg/dL — AB (ref 0–149)
VLDL CHOLESTEROL CAL: 50 mg/dL — AB (ref 5–40)

## 2018-07-12 LAB — TSH: TSH: 0.762 u[IU]/mL (ref 0.450–4.500)

## 2018-07-12 LAB — HEMOGLOBIN A1C
Est. average glucose Bld gHb Est-mCnc: 157 mg/dL
Hgb A1c MFr Bld: 7.1 % — ABNORMAL HIGH (ref 4.8–5.6)

## 2018-07-13 ENCOUNTER — Ambulatory Visit (INDEPENDENT_AMBULATORY_CARE_PROVIDER_SITE_OTHER): Payer: BLUE CROSS/BLUE SHIELD | Admitting: Gynecology

## 2018-07-13 ENCOUNTER — Encounter: Payer: Self-pay | Admitting: Gynecology

## 2018-07-13 VITALS — BP 142/90 | Ht 62.0 in | Wt 227.0 lb

## 2018-07-13 DIAGNOSIS — R87610 Atypical squamous cells of undetermined significance on cytologic smear of cervix (ASC-US): Secondary | ICD-10-CM | POA: Diagnosis not present

## 2018-07-13 DIAGNOSIS — Z01419 Encounter for gynecological examination (general) (routine) without abnormal findings: Secondary | ICD-10-CM

## 2018-07-13 NOTE — Progress Notes (Signed)
    Cassandra Campos 04/20/1971 [id number]        48 y.o.  [id number] for annual gynecologic exam.  Is having some hot flushes on and off since stopping her HRT.  Past medical history,surgical history, problem list, medications, allergies, family history and social history were all reviewed and documented as reviewed in the EPIC chart.  ROS:  Performed with pertinent positives and negatives included in the history, assessment and plan.   Additional significant findings : None   Exam: Caryn Bee assistant Vitals:   07/13/18 0824  BP: (!) 142/90  Weight: 227 lb (103 kg)  Height: 5\' 2"  (1.575 m)   Body mass index is 41.52 kg/m.  General appearance:  Normal affect, orientation and appearance. Skin: Grossly normal HEENT: Without gross lesions.  No cervical or supraclavicular adenopathy. Thyroid normal.  Lungs:  Clear without wheezing, rales or rhonchi Cardiac: RR, without RMG Abdominal:  Soft, nontender, without masses, guarding, rebound, organomegaly or hernia Breasts:  Examined lying and sitting without masses, retractions, discharge or axillary adenopathy. Pelvic:  Ext, BUS, Vagina: Normal.  Pap smear of vaginal cuff done  Adnexa: Without masses or tenderness    Anus and perineum: Normal   Rectovaginal: Normal sphincter tone without palpated masses or tenderness.    Assessment/Plan:  48 y.o. [id number] female for annual gynecologic exam.  Status post TAH and subsequent BSO.  1. Menopausal symptoms.  Had been on HRT but we weaned her off due to the risks of thrombosis and her medical issues.  She is having some hot flushes and sweats but overall tolerable. 2. ASCUS Pap smear negative high risk HPV last year.  Pap smear of vaginal cuff done today.  No other history of abnormal Pap smears.  Discussed possible colposcopy if persistently abnormal. 3. Mammography 02/2018.  Continue with annual mammography when due.  Breast exam normal today. 4. Colonoscopy 2018.  Repeat at their  recommended interval. 5. Health maintenance.  Elevated blood pressure noted.  History of hypertension and end-stage renal disease.  Being actively followed by internal medicine.  She will continue to monitor her blood pressure and follow-up with them as needed.  Follow-up for GYN exam in 1 year.   Anastasio Auerbach MD, 8:49 AM 07/13/2018

## 2018-07-13 NOTE — Patient Instructions (Signed)
Follow-up in 1 year for annual exam 

## 2018-07-13 NOTE — Addendum Note (Signed)
Addended by: Nelva Nay on: 07/13/2018 10:30 AM   Modules accepted: Orders

## 2018-07-14 DIAGNOSIS — N2581 Secondary hyperparathyroidism of renal origin: Secondary | ICD-10-CM | POA: Diagnosis not present

## 2018-07-14 DIAGNOSIS — N186 End stage renal disease: Secondary | ICD-10-CM | POA: Diagnosis not present

## 2018-07-15 DIAGNOSIS — Z992 Dependence on renal dialysis: Secondary | ICD-10-CM | POA: Diagnosis not present

## 2018-07-15 DIAGNOSIS — E1122 Type 2 diabetes mellitus with diabetic chronic kidney disease: Secondary | ICD-10-CM | POA: Diagnosis not present

## 2018-07-15 DIAGNOSIS — N186 End stage renal disease: Secondary | ICD-10-CM | POA: Diagnosis not present

## 2018-07-17 DIAGNOSIS — N186 End stage renal disease: Secondary | ICD-10-CM | POA: Diagnosis not present

## 2018-07-17 DIAGNOSIS — N2581 Secondary hyperparathyroidism of renal origin: Secondary | ICD-10-CM | POA: Diagnosis not present

## 2018-07-17 DIAGNOSIS — R51 Headache: Secondary | ICD-10-CM | POA: Diagnosis not present

## 2018-07-17 LAB — PAP IG W/ RFLX HPV ASCU

## 2018-07-18 ENCOUNTER — Ambulatory Visit (HOSPITAL_COMMUNITY): Payer: BLUE CROSS/BLUE SHIELD | Attending: Cardiovascular Disease

## 2018-07-18 ENCOUNTER — Telehealth: Payer: Self-pay | Admitting: Neurology

## 2018-07-18 ENCOUNTER — Ambulatory Visit (INDEPENDENT_AMBULATORY_CARE_PROVIDER_SITE_OTHER): Payer: BLUE CROSS/BLUE SHIELD

## 2018-07-18 DIAGNOSIS — Z7682 Awaiting organ transplant status: Secondary | ICD-10-CM | POA: Diagnosis not present

## 2018-07-18 DIAGNOSIS — R002 Palpitations: Secondary | ICD-10-CM

## 2018-07-18 NOTE — Telephone Encounter (Signed)
Called to offer the patient an apt sooner as she is scheduled in June. There was no answer. As of this moment mar 12 there was an opening. If patient calls back we can offer this to the patient.

## 2018-07-19 DIAGNOSIS — R51 Headache: Secondary | ICD-10-CM | POA: Diagnosis not present

## 2018-07-19 DIAGNOSIS — N2581 Secondary hyperparathyroidism of renal origin: Secondary | ICD-10-CM | POA: Diagnosis not present

## 2018-07-19 DIAGNOSIS — N186 End stage renal disease: Secondary | ICD-10-CM | POA: Diagnosis not present

## 2018-07-21 DIAGNOSIS — N186 End stage renal disease: Secondary | ICD-10-CM | POA: Diagnosis not present

## 2018-07-21 DIAGNOSIS — N2581 Secondary hyperparathyroidism of renal origin: Secondary | ICD-10-CM | POA: Diagnosis not present

## 2018-07-21 DIAGNOSIS — R51 Headache: Secondary | ICD-10-CM | POA: Diagnosis not present

## 2018-07-24 DIAGNOSIS — Z23 Encounter for immunization: Secondary | ICD-10-CM | POA: Diagnosis not present

## 2018-07-24 DIAGNOSIS — N186 End stage renal disease: Secondary | ICD-10-CM | POA: Diagnosis not present

## 2018-07-24 DIAGNOSIS — N2581 Secondary hyperparathyroidism of renal origin: Secondary | ICD-10-CM | POA: Diagnosis not present

## 2018-07-24 DIAGNOSIS — R197 Diarrhea, unspecified: Secondary | ICD-10-CM | POA: Diagnosis not present

## 2018-07-26 ENCOUNTER — Ambulatory Visit: Payer: Self-pay | Admitting: Neurology

## 2018-07-26 DIAGNOSIS — Z23 Encounter for immunization: Secondary | ICD-10-CM | POA: Diagnosis not present

## 2018-07-26 DIAGNOSIS — N2581 Secondary hyperparathyroidism of renal origin: Secondary | ICD-10-CM | POA: Diagnosis not present

## 2018-07-26 DIAGNOSIS — N186 End stage renal disease: Secondary | ICD-10-CM | POA: Diagnosis not present

## 2018-07-26 DIAGNOSIS — R197 Diarrhea, unspecified: Secondary | ICD-10-CM | POA: Diagnosis not present

## 2018-07-28 DIAGNOSIS — N186 End stage renal disease: Secondary | ICD-10-CM | POA: Diagnosis not present

## 2018-07-28 DIAGNOSIS — Z23 Encounter for immunization: Secondary | ICD-10-CM | POA: Diagnosis not present

## 2018-07-28 DIAGNOSIS — N2581 Secondary hyperparathyroidism of renal origin: Secondary | ICD-10-CM | POA: Diagnosis not present

## 2018-07-28 DIAGNOSIS — R002 Palpitations: Secondary | ICD-10-CM | POA: Diagnosis not present

## 2018-07-28 DIAGNOSIS — R197 Diarrhea, unspecified: Secondary | ICD-10-CM | POA: Diagnosis not present

## 2018-07-31 DIAGNOSIS — N186 End stage renal disease: Secondary | ICD-10-CM | POA: Diagnosis not present

## 2018-07-31 DIAGNOSIS — N2581 Secondary hyperparathyroidism of renal origin: Secondary | ICD-10-CM | POA: Diagnosis not present

## 2018-08-01 ENCOUNTER — Other Ambulatory Visit: Payer: Self-pay | Admitting: Internal Medicine

## 2018-08-02 DIAGNOSIS — N186 End stage renal disease: Secondary | ICD-10-CM | POA: Diagnosis not present

## 2018-08-02 DIAGNOSIS — N2581 Secondary hyperparathyroidism of renal origin: Secondary | ICD-10-CM | POA: Diagnosis not present

## 2018-08-04 DIAGNOSIS — N2581 Secondary hyperparathyroidism of renal origin: Secondary | ICD-10-CM | POA: Diagnosis not present

## 2018-08-04 DIAGNOSIS — N186 End stage renal disease: Secondary | ICD-10-CM | POA: Diagnosis not present

## 2018-08-07 DIAGNOSIS — N2581 Secondary hyperparathyroidism of renal origin: Secondary | ICD-10-CM | POA: Diagnosis not present

## 2018-08-07 DIAGNOSIS — N186 End stage renal disease: Secondary | ICD-10-CM | POA: Diagnosis not present

## 2018-08-09 DIAGNOSIS — N2581 Secondary hyperparathyroidism of renal origin: Secondary | ICD-10-CM | POA: Diagnosis not present

## 2018-08-09 DIAGNOSIS — N186 End stage renal disease: Secondary | ICD-10-CM | POA: Diagnosis not present

## 2018-08-10 DIAGNOSIS — K760 Fatty (change of) liver, not elsewhere classified: Secondary | ICD-10-CM | POA: Diagnosis not present

## 2018-08-10 DIAGNOSIS — Z7682 Awaiting organ transplant status: Secondary | ICD-10-CM | POA: Diagnosis not present

## 2018-08-11 DIAGNOSIS — N2581 Secondary hyperparathyroidism of renal origin: Secondary | ICD-10-CM | POA: Diagnosis not present

## 2018-08-11 DIAGNOSIS — N186 End stage renal disease: Secondary | ICD-10-CM | POA: Diagnosis not present

## 2018-08-14 DIAGNOSIS — N186 End stage renal disease: Secondary | ICD-10-CM | POA: Diagnosis not present

## 2018-08-14 DIAGNOSIS — N2581 Secondary hyperparathyroidism of renal origin: Secondary | ICD-10-CM | POA: Diagnosis not present

## 2018-08-15 DIAGNOSIS — N186 End stage renal disease: Secondary | ICD-10-CM | POA: Diagnosis not present

## 2018-08-15 DIAGNOSIS — Z992 Dependence on renal dialysis: Secondary | ICD-10-CM | POA: Diagnosis not present

## 2018-08-15 DIAGNOSIS — E1122 Type 2 diabetes mellitus with diabetic chronic kidney disease: Secondary | ICD-10-CM | POA: Diagnosis not present

## 2018-08-16 ENCOUNTER — Encounter: Payer: Self-pay | Admitting: Neurology

## 2018-08-16 ENCOUNTER — Telehealth: Payer: Self-pay | Admitting: Neurology

## 2018-08-16 DIAGNOSIS — N186 End stage renal disease: Secondary | ICD-10-CM | POA: Diagnosis not present

## 2018-08-16 DIAGNOSIS — N2581 Secondary hyperparathyroidism of renal origin: Secondary | ICD-10-CM | POA: Diagnosis not present

## 2018-08-16 NOTE — Telephone Encounter (Signed)
Called the patient from the wait list and offered her a earlier apt for August 22, 2018 at 3:30 pm. Pt was on the waitlist and was originally scheduled for June. Was able to move her up. Informed them that our office has placed new protocols in place for our office visits. Due to the virus pandemic our office is reducing our number of office visits in order to minimize the risk to our patients and healthcare providers. Advised that our office is now providing the capability to offer the patients phone visits at this time. Informed of what that process looks like and informed that the telephone office visit will still be billed through insurance and due to [place] we need them to know since the appointment is taking place over the phone, we can't guarantee the security of the phone line. With that said if we do move forward I would have to get verbal consent to completed the call over the phone. Pt has given verbal consent to complete the video visit. Pt's email is [email or web address]. Pt understands that the [employer] webex software must be downloaded and operational on the device pt plans to use for the visit. Reviewed the patient's chart and made sure everything is up to date. Pt verbalized understanding.

## 2018-08-18 DIAGNOSIS — N186 End stage renal disease: Secondary | ICD-10-CM | POA: Diagnosis not present

## 2018-08-18 DIAGNOSIS — N2581 Secondary hyperparathyroidism of renal origin: Secondary | ICD-10-CM | POA: Diagnosis not present

## 2018-08-21 DIAGNOSIS — N186 End stage renal disease: Secondary | ICD-10-CM | POA: Diagnosis not present

## 2018-08-21 DIAGNOSIS — N2581 Secondary hyperparathyroidism of renal origin: Secondary | ICD-10-CM | POA: Diagnosis not present

## 2018-08-22 ENCOUNTER — Other Ambulatory Visit: Payer: Self-pay

## 2018-08-22 ENCOUNTER — Ambulatory Visit (INDEPENDENT_AMBULATORY_CARE_PROVIDER_SITE_OTHER): Payer: BLUE CROSS/BLUE SHIELD | Admitting: Neurology

## 2018-08-22 DIAGNOSIS — Z992 Dependence on renal dialysis: Secondary | ICD-10-CM

## 2018-08-22 DIAGNOSIS — H04123 Dry eye syndrome of bilateral lacrimal glands: Secondary | ICD-10-CM | POA: Diagnosis not present

## 2018-08-22 DIAGNOSIS — N185 Chronic kidney disease, stage 5: Secondary | ICD-10-CM

## 2018-08-22 DIAGNOSIS — N186 End stage renal disease: Secondary | ICD-10-CM

## 2018-08-22 DIAGNOSIS — H43391 Other vitreous opacities, right eye: Secondary | ICD-10-CM | POA: Diagnosis not present

## 2018-08-22 DIAGNOSIS — Z9889 Other specified postprocedural states: Secondary | ICD-10-CM | POA: Diagnosis not present

## 2018-08-22 DIAGNOSIS — G4733 Obstructive sleep apnea (adult) (pediatric): Secondary | ICD-10-CM | POA: Diagnosis not present

## 2018-08-22 DIAGNOSIS — G4719 Other hypersomnia: Secondary | ICD-10-CM | POA: Diagnosis not present

## 2018-08-22 DIAGNOSIS — Z91199 Patient's noncompliance with other medical treatment and regimen due to unspecified reason: Secondary | ICD-10-CM

## 2018-08-22 DIAGNOSIS — D631 Anemia in chronic kidney disease: Secondary | ICD-10-CM

## 2018-08-22 DIAGNOSIS — H10413 Chronic giant papillary conjunctivitis, bilateral: Secondary | ICD-10-CM | POA: Diagnosis not present

## 2018-08-22 DIAGNOSIS — E113393 Type 2 diabetes mellitus with moderate nonproliferative diabetic retinopathy without macular edema, bilateral: Secondary | ICD-10-CM | POA: Diagnosis not present

## 2018-08-22 DIAGNOSIS — Z9119 Patient's noncompliance with other medical treatment and regimen: Secondary | ICD-10-CM

## 2018-08-22 MED ORDER — GLUCOSE BLOOD VI STRP: ORAL_STRIP | 2 refills | Status: DC

## 2018-08-23 ENCOUNTER — Other Ambulatory Visit: Payer: Self-pay

## 2018-08-23 DIAGNOSIS — N186 End stage renal disease: Secondary | ICD-10-CM | POA: Diagnosis not present

## 2018-08-23 DIAGNOSIS — N2581 Secondary hyperparathyroidism of renal origin: Secondary | ICD-10-CM | POA: Diagnosis not present

## 2018-08-23 MED ORDER — DEXCOM G6 RECEIVER DEVI: 1.0000 | Freq: Four times a day (QID) | 1 refills | Status: DC

## 2018-08-23 MED ORDER — DEXCOM G6 SENSOR MISC: 1.0000 | Freq: Four times a day (QID) | 2 refills | Status: DC

## 2018-08-24 ENCOUNTER — Other Ambulatory Visit: Payer: Self-pay

## 2018-08-24 MED ORDER — DEXCOM G6 TRANSMITTER MISC: 1.0000 | Freq: Four times a day (QID) | 2 refills | Status: DC

## 2018-08-25 ENCOUNTER — Other Ambulatory Visit: Payer: Self-pay | Admitting: Internal Medicine

## 2018-08-25 DIAGNOSIS — N2581 Secondary hyperparathyroidism of renal origin: Secondary | ICD-10-CM | POA: Diagnosis not present

## 2018-08-25 DIAGNOSIS — N186 End stage renal disease: Secondary | ICD-10-CM | POA: Diagnosis not present

## 2018-08-28 DIAGNOSIS — N2581 Secondary hyperparathyroidism of renal origin: Secondary | ICD-10-CM | POA: Diagnosis not present

## 2018-08-28 DIAGNOSIS — N186 End stage renal disease: Secondary | ICD-10-CM | POA: Diagnosis not present

## 2018-08-29 ENCOUNTER — Other Ambulatory Visit: Payer: Self-pay | Admitting: Internal Medicine

## 2018-08-29 DIAGNOSIS — E113512 Type 2 diabetes mellitus with proliferative diabetic retinopathy with macular edema, left eye: Secondary | ICD-10-CM | POA: Diagnosis not present

## 2018-08-29 LAB — HM DIABETES EYE EXAM

## 2018-08-30 DIAGNOSIS — N2581 Secondary hyperparathyroidism of renal origin: Secondary | ICD-10-CM | POA: Diagnosis not present

## 2018-08-30 DIAGNOSIS — N186 End stage renal disease: Secondary | ICD-10-CM | POA: Diagnosis not present

## 2018-08-31 ENCOUNTER — Encounter: Payer: Self-pay | Admitting: Neurology

## 2018-08-31 DIAGNOSIS — N185 Chronic kidney disease, stage 5: Secondary | ICD-10-CM

## 2018-08-31 DIAGNOSIS — Z992 Dependence on renal dialysis: Secondary | ICD-10-CM | POA: Insufficient documentation

## 2018-08-31 DIAGNOSIS — N186 End stage renal disease: Secondary | ICD-10-CM | POA: Insufficient documentation

## 2018-08-31 DIAGNOSIS — D631 Anemia in chronic kidney disease: Secondary | ICD-10-CM | POA: Insufficient documentation

## 2018-08-31 DIAGNOSIS — G4733 Obstructive sleep apnea (adult) (pediatric): Secondary | ICD-10-CM | POA: Insufficient documentation

## 2018-08-31 NOTE — Patient Instructions (Signed)

## 2018-08-31 NOTE — Progress Notes (Signed)
Virtual Visit via Video Note  I connected with Cassandra Campos on 08-22-2018 at  3:30 PM EDT by a video enabled telemedicine application and verified that I am speaking with the correct person using two identifiers.   I discussed the limitations of evaluation and management by telemedicine and the availability of in person appointments. The patient expressed understanding and agreed to proceed.  History of Present Illness: I had the pleasure of meeting this patient before referred by Dr. Gertie Exon her primary care physician.  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  Good productivity had suffered she make more mistakes she was called by her M workers to be dozing off.  She had endorsed the Epworth score at the time at 16 points.  She then returned for another visit on June 07, 2016 this time she had developed CKD stage IV related to diabetes-hypertension, we discussed a polysomnography which she had undergone on 10-20-15 when she was diagnosed with rather mild apnea at an AHI of 10.3/h but strong REM sleep exacerbation.  Her REM sleep AHI was 47.2.  Equally strong was the exacerbation with supine sleep position.  She was hypoxemic for a very long time to at 61 minutes.  A CPAP titration was strongly recommended which she underwent on 27 November 2015 when her apnea index was reduced to 3.1.  At the time of the very first examination she was a shift worker working night shifts when I finally saw her the last time she had lost her job and while she had reported that initially CPAP worked well for her she was not longer able to afford supplies.  Now on 22 August 2018 she presents with heart flutter, palpitations, and she has reached end-stage renal disease, now in treatment on hemodialysis on [employer] [employer].  Her nephrologist is Dr.Deterding . She has now anemia. Her insomnia has improved but her daytime  sleepiness has gotten worse.  Since she has reached end-stage renal disease she has no nocturia anymore tachycardia seems to be more prevalent on days of nondialysis.  Her family has noted that she snores loudly she reports morning headaches, suffering from gout, hypertension, morbid obesity and diabetes.  She denies waking up with a dry mouth.  She does have a slight tremor in both hands but not a flapping tremor.  Sleep habits: dinnertime for this patient is between 630 and 7 PM but she is not in bed before midnight usually between midnight and 1 AM she states it is easier for her to fall asleep if she goes to bed later.  She states that she is restless at night she sleeps on her right side on one pillow on Monday, Wednesday, Friday she has hemodialysis appointments.  She rises at 6 AM having only 5 hours of sleep on those nights and then feels sleep deprived.  She works from 8 AM to 6 PM on her nondialysis days.  During hemodialysis she naps.    She no longer use CPAP since 2018 and her Epworth sleepiness score is now exacerbated to 16 out of 24 points.  Social history the patient is married, she is employed, she has a history of a C-section.  Denies any tobacco use never smoked, she does not drink, does not use caffeine.   Family history; her mother suffered from hypertension and diabetes as did her father her father also had stomach cancer both maternal  grandparents had strokes.    Observations/Objective: Mrs. Cassandra Campos has now a neck circumference of 18 inches a BMI of 39.5 at a weight of 230 pounds. She appears not short of breath and cannot complete sentences without being interrupted, there is no dysarthria or dysphonia.  Her eye movements seem to be regular her pupils of equal size she does not have a facial droop.  Tongue and uvula were not easy to appreciate her neck circumference was 18 inches but her uvula was not visible.  I would score this is a Mallampati grade 4.  She was able to provide  shoulder shrug and she can rotate laterally tilt flex and hyperextend her neck.  Her hands have as slight tremor which can be related to the end-stage renal disease.  She also had a fistula placed.  There may be some left hand clumsiness and weakness with grip strength due to this.  I did not examine the patient's gait, her ability to stand on one leg turn neither her balance or coordination any further.    Assessment and Plan: An Epworth sleepiness score of 16 out of 24 points is very high and certainly interferes with functioning in social as well as professional capacities.  I would like for her to try to advance her bedtime 5 hours of sleep allocated are just not enough to function.  In addition I have no doubt that the patient has obstructive sleep apnea and that the sleep apnea may have gotten worse over the last years.  Her comorbidities of morbid obesity, diabetes, hypertension, gout and the observed snoring and morning headaches all speak for it.  Since he is on hemodialysis her home sleep test should be performed at that time of a nondialysis day.  Her dry weight will be lower after dialysis treatment and will not reflect the worst possible apnea.  I would like for her to have the home sleep test therefore on a nondialysis day and we will then initiate further treatment.  I was not sure if the patient has atrial fibrillation but this is certainly another complicating factor while being on hemodialysis and while having apneas that are not super completely treated.   She always had tachycardia at baseline but she felt palpitations more frequently in the last 6 months.   Follow Up Instructions:  HST on a non dialysis day.  Patient has been non compliant with CPAP and dietary regimen in the past. I like for her to change her compliance and sleep habits in preparation.     I discussed the assessment and treatment plan with the patient. The patient was provided an opportunity to ask questions and  all were answered. The patient agreed with the plan and demonstrated an understanding of the instructions.   The patient was advised to call back or seek an in-person evaluation if the symptoms worsen or if the condition fails to improve as anticipated.  I provided 25 minutes of non-face-to-face time during this encounter.   Larey Seat, MD

## 2018-09-01 DIAGNOSIS — N2581 Secondary hyperparathyroidism of renal origin: Secondary | ICD-10-CM | POA: Diagnosis not present

## 2018-09-01 DIAGNOSIS — N186 End stage renal disease: Secondary | ICD-10-CM | POA: Diagnosis not present

## 2018-09-04 DIAGNOSIS — N186 End stage renal disease: Secondary | ICD-10-CM | POA: Diagnosis not present

## 2018-09-04 DIAGNOSIS — N2581 Secondary hyperparathyroidism of renal origin: Secondary | ICD-10-CM | POA: Diagnosis not present

## 2018-09-06 DIAGNOSIS — N186 End stage renal disease: Secondary | ICD-10-CM | POA: Diagnosis not present

## 2018-09-06 DIAGNOSIS — N2581 Secondary hyperparathyroidism of renal origin: Secondary | ICD-10-CM | POA: Diagnosis not present

## 2018-09-08 DIAGNOSIS — N2581 Secondary hyperparathyroidism of renal origin: Secondary | ICD-10-CM | POA: Diagnosis not present

## 2018-09-08 DIAGNOSIS — N186 End stage renal disease: Secondary | ICD-10-CM | POA: Diagnosis not present

## 2018-09-11 DIAGNOSIS — Z7682 Awaiting organ transplant status: Secondary | ICD-10-CM | POA: Diagnosis not present

## 2018-09-11 DIAGNOSIS — N186 End stage renal disease: Secondary | ICD-10-CM | POA: Diagnosis not present

## 2018-09-11 DIAGNOSIS — N2581 Secondary hyperparathyroidism of renal origin: Secondary | ICD-10-CM | POA: Diagnosis not present

## 2018-09-13 DIAGNOSIS — N186 End stage renal disease: Secondary | ICD-10-CM | POA: Diagnosis not present

## 2018-09-13 DIAGNOSIS — N2581 Secondary hyperparathyroidism of renal origin: Secondary | ICD-10-CM | POA: Diagnosis not present

## 2018-09-14 DIAGNOSIS — E1122 Type 2 diabetes mellitus with diabetic chronic kidney disease: Secondary | ICD-10-CM | POA: Diagnosis not present

## 2018-09-14 DIAGNOSIS — Z992 Dependence on renal dialysis: Secondary | ICD-10-CM | POA: Diagnosis not present

## 2018-09-14 DIAGNOSIS — N186 End stage renal disease: Secondary | ICD-10-CM | POA: Diagnosis not present

## 2018-09-15 DIAGNOSIS — N186 End stage renal disease: Secondary | ICD-10-CM | POA: Diagnosis not present

## 2018-09-15 DIAGNOSIS — N2581 Secondary hyperparathyroidism of renal origin: Secondary | ICD-10-CM | POA: Diagnosis not present

## 2018-09-18 ENCOUNTER — Telehealth: Payer: Self-pay | Admitting: Internal Medicine

## 2018-09-18 DIAGNOSIS — N2581 Secondary hyperparathyroidism of renal origin: Secondary | ICD-10-CM | POA: Diagnosis not present

## 2018-09-18 DIAGNOSIS — N186 End stage renal disease: Secondary | ICD-10-CM | POA: Diagnosis not present

## 2018-09-18 NOTE — Telephone Encounter (Signed)
Smartphone/consent/ my chart/ pre reg completed °

## 2018-09-19 ENCOUNTER — Telehealth (INDEPENDENT_AMBULATORY_CARE_PROVIDER_SITE_OTHER): Payer: BLUE CROSS/BLUE SHIELD | Admitting: Internal Medicine

## 2018-09-19 ENCOUNTER — Other Ambulatory Visit: Payer: Self-pay

## 2018-09-19 ENCOUNTER — Encounter: Payer: Self-pay | Admitting: Internal Medicine

## 2018-09-19 VITALS — BP 128/62 | Ht 62.0 in | Wt 228.0 lb

## 2018-09-19 DIAGNOSIS — N184 Chronic kidney disease, stage 4 (severe): Secondary | ICD-10-CM

## 2018-09-19 DIAGNOSIS — Z7189 Other specified counseling: Secondary | ICD-10-CM

## 2018-09-19 DIAGNOSIS — N186 End stage renal disease: Secondary | ICD-10-CM

## 2018-09-19 DIAGNOSIS — E781 Pure hyperglyceridemia: Secondary | ICD-10-CM

## 2018-09-19 DIAGNOSIS — E0829 Diabetes mellitus due to underlying condition with other diabetic kidney complication: Secondary | ICD-10-CM

## 2018-09-19 DIAGNOSIS — E785 Hyperlipidemia, unspecified: Secondary | ICD-10-CM | POA: Diagnosis not present

## 2018-09-19 DIAGNOSIS — Z992 Dependence on renal dialysis: Secondary | ICD-10-CM

## 2018-09-19 DIAGNOSIS — K760 Fatty (change of) liver, not elsewhere classified: Secondary | ICD-10-CM

## 2018-09-19 MED ORDER — GLUCOSE BLOOD VI STRP: ORAL_STRIP | 2 refills | Status: DC

## 2018-09-19 MED ORDER — ROSUVASTATIN CALCIUM 20 MG PO TABS: 20.0000 mg | ORAL_TABLET | Freq: Every day | ORAL | 3 refills | Status: DC

## 2018-09-19 NOTE — Patient Instructions (Signed)
Medication Instructions:  START crestor 20mg  daily If you need a refill on your cardiac medications before your next appointment, please call your pharmacy.   Lab work: FASTING lab work in 3 months - lipid panel, liver function test If you have labs (blood work) drawn today and your tests are completely normal, you will receive your results only by: Marland Kitchen MyChart Message (if you have MyChart) OR . A paper copy in the mail If you have any lab test that is abnormal or we need to change your treatment, we will call you to review the results.  Testing/Procedures: NONE  Follow-Up: Dr. Debara Pickett recommends that you schedule a follow up visit with him the in the [place] in 3 months.  Please have fasting blood work about 1 week prior to this visit and he will review the blood work results with you at your appointment.

## 2018-09-19 NOTE — Progress Notes (Signed)
Virtual Visit via Video Note   This visit type was conducted due to national recommendations for restrictions regarding the COVID-19 Pandemic (e.g. social distancing) in an effort to limit this patient's exposure and mitigate transmission in our community.  Due to her co-morbid illnesses, this patient is at least at moderate risk for complications without adequate follow up.  This format is felt to be most appropriate for this patient at this time.  All issues noted in this document were discussed and addressed.  A limited physical exam was performed with this format.  Please refer to the patient's chart for her consent to telehealth for [facility].   Evaluation Performed:  Video visit via [email or web address][employer]me  Date:  09/19/2018   ID:  Cassandra Campos, Cassandra Campos [birth year], MRN [id number]  Patient Location:  [place] [id number]  Provider location:   [address] [place], [place] [address]  PCP:  Cassandra Chard, MD  Cardiologist:  No primary Campos provider on file. Electrophysiologist:  None   Chief Complaint:  New lipid clinic referral  History of Present Illness:    Cassandra Campos is a 48 y.o. female who presents via Engineer, civil (consulting) for a telehealth visit today.  [name] is a 48 year old female patient who is seen by Dr. Alvester Campos.  Her past medical history significant for poorly controlled diabetes, hypertension, dyslipidemia and morbid obesity as well as features of metabolic syndrome.  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  She is planning on kidney transplant and underwent work-up for atypical chest pain in January including stress test and echocardiogram which showed no reversible ischemia and normal LV function.  At the time she was noted to have a dyslipidemia with primarily elevated triglycerides, mildly increased LDL cholesterol and low HDL cholesterol.  She was referred to the lipid  clinic for evaluation and management of dyslipidemia.  According to her Framingham risk, I would place her in a high risk category with a 10-year risk greater than 20%, including the fact that she has diabetes and greater than 2 cardiovascular risk factors as well as endorgan complications related to her diabetes.  The patient does not have symptoms concerning for COVID-19 infection (fever, chills, cough, or new SHORTNESS OF BREATH).    Prior CV studies:   The following studies were reviewed today:  Labwork Stress test Echocardiogram  PMHx:  Past Medical History:  Diagnosis Date   Anemia associated with chronic renal failure    ASCUS of cervix with negative high risk HPV 06/2017   CKD (chronic kidney disease), stage IV ([place]) no dialysis yet   nephrologist-  dr Cassandra Campos-- scheduled next visit 09-08-2017 (lov 07-05-2017)   Diabetic retinopathy ([place])    Dialysis patient ([place])    Elevated transaminase level    Fatty liver    FOLLOWED BY DR Cassandra Campos   Hypertension    Idiopathic gout of multiple sites    09-05-2017 per pt stable   Insulin dependent type 2 diabetes mellitus ([place])    followed by dr Cassandra Campos   Mixed hyperlipidemia    OSA (obstructive sleep apnea)    per study 10-20-2015 mild osa w/ AHI 10.3/hr;  09-05-2017 per pt has not used cpap since 1 yr ago   Peripheral neuropathy    feet   S/P arteriovenous (AV) fistula creation 07-04-2015  w/ revision 02-14-2017   left braniocephilic   Trigger thumb, right thumb    Umbilical hernia  Past Surgical History:  Procedure Laterality Date   ABDOMINAL HYSTERECTOMY  07/11/2000   dr Cassandra Campos   TAH/BSO for irregular bleeding and leiomyoma   AV FISTULA PLACEMENT Left 07/04/2015   Procedure: ARTERIOVENOUS (AV) FISTULA CREATION-LEFT;  Surgeon: Cassandra Misty, MD;  Location: [place];  Service: Vascular;  Laterality: Left;   BREAST EXCISIONAL BIOPSY Right    benign   CARPAL TUNNEL RELEASE Bilateral 1993 and [place]:  09-25-1999   BILATERAL TUBAL LIGATION W/ LAST C/S   DILATION AND CURETTAGE OF UTERUS     EXPLORATORY [place] / [facility] ADHESIONS/  BILATERAL SALPINGOOPHORECTOMY  07-20-2011  dr s. Cassandra Campos  [facility]   FISTULA SUPERFICIALIZATION Left 08/28/2015   Procedure: BRACHIOCEPHALIC FISTULA SUPERFICIALIZATION;  Surgeon: Cassandra Misty, MD;  Location: [place];  Service: Vascular;  Laterality: Left;   PATCH ANGIOPLASTY Left 02/14/2017   Procedure: PATCH ANGIOPLASTY;  Surgeon: Cassandra Dutch, MD;  Location: [place];  Service: Vascular;  Laterality: Left;   PELVIC LAPAROSCOPY  1994   exploration for ectopic preg.   RETINAL DETACHMENT SURGERY Left 2015   REVISON OF ARTERIOVENOUS FISTULA Left 02/14/2017   Procedure: REVISON OF ARTERIOVENOUS FISTULA  LEFT ARM;  Surgeon: Cassandra Dutch, MD;  Location: [place];  Service: Vascular;  Laterality: Left;   TRIGGER FINGER RELEASE Left 2015   thumb   TRIGGER FINGER RELEASE Right 09/09/2017   Procedure: RELEASE TRIGGER FINGER/A-1 PULLEY RIGHT THUMB;  Surgeon: Cassandra Leitz, MD;  Location: [place];  Service: Orthopedics;  Laterality: Right;    FAMHx:  Family History  Problem Relation Age of Onset   Diabetes Mother    Hypertension Mother    Diabetes Father    Hypertension Father    Cancer Father        STOMACH   Stomach cancer Father    Stroke Maternal Grandmother    Stroke Maternal Grandfather    Colon cancer Neg Hx    Rectal cancer Neg Hx     SOCHx:   reports that she has never smoked. She has never used smokeless tobacco. She reports that she does not drink alcohol or use drugs.  ALLERGIES:  No Known Allergies  MEDS:  Current Meds  Medication Sig   allopurinol (ZYLOPRIM) 300 MG tablet Take 300 mg by mouth every morning.    amLODipine (NORVASC) 2.5 MG tablet    COLCRYS 0.6 MG tablet TAKE ONE TABLET BY MOUTH ONCE DAILY   Continuous Blood Gluc Receiver (DEXCOM G6 RECEIVER) DEVI 1 each by Does  not apply route 4 (four) times daily. Dx: e11.65   Continuous Blood Gluc Sensor (DEXCOM G6 SENSOR) MISC 1 each by Does not apply route QID.   Continuous Blood Gluc Transmit (DEXCOM G6 TRANSMITTER) MISC 1 each by Does not apply route QID.   cromolyn (OPTICROM) 4 % ophthalmic solution INSTILL 1 DROP INTO BOTH EYES FOUR TIMES A DAY   EASY COMFORT PEN NEEDLES 31G X 8 MM MISC INJECT TWICE A DAY AS DIRECTED   glucose blood (ONETOUCH VERIO) test strip Use as directed to check blood sugars 4 times per day dx:e11.65   HUMULIN R 500 UNIT/ML injection 180 units in the morning and 140 units in the evening before meals   lanthanum (FOSRENOL) 1000 MG chewable tablet Chew 1,000 mg by mouth 3 (three) times daily with meals.   lidocaine-prilocaine (EMLA) cream USE ON ARAMS AS NEEDED FOR DIALYSIS   OZEMPIC, 0.25 OR 0.5 MG/DOSE, 2 MG/1.5ML  SOPN INJECT 0.5 MG INTO THE SKIN ONCE A WEEK.   pravastatin (PRAVACHOL) 40 MG tablet TAKE 1 TABLET BY ORAL ROUTE EVERY DAY   pregabalin (LYRICA) 150 MG capsule TAKE ONE CAPSULE BY MOUTH ONCE DAILY   RESTASIS 0.05 % ophthalmic emulsion INSTILL 1 DROP INTO BOTH EYES TWICE A DAY   [DISCONTINUED] amLODipine (NORVASC) 5 MG tablet Take 1 tablet (5 mg total) by mouth daily.     ROS: Pertinent items noted in HPI and remainder of comprehensive ROS otherwise negative.  Labs/Other Tests and Data Reviewed:    Recent Labs: 04/04/2018: BUN 35; Creatinine, Ser 6.80; Hemoglobin 12.2; Potassium 3.8; Sodium 133 07/11/2018: TSH 0.762   Recent Lipid Panel Lab Results  Component Value Date/Time   CHOL 188 07/11/2018 11:17 AM   TRIG 251 (H) 07/11/2018 11:17 AM   HDL 43 07/11/2018 11:17 AM   CHOLHDL 4.4 07/11/2018 11:17 AM   LDLCALC 95 07/11/2018 11:17 AM    Wt Readings from Last 3 Encounters:  09/19/18 228 lb (103.4 kg)  07/13/18 227 lb (103 kg)  07/11/18 226 lb 9.6 oz (102.8 kg)     Exam:    Vital Signs:  BP 128/62    Ht 5\' 2"  (1.575 m)    Wt 228 lb (103.4 kg)     BMI 41.70 kg/m    General appearance: alert, no distress and morbidly obese Lungs: No audible wheezing or visual respiratory difficulty Abdomen: soft, non-tender; bowel sounds normal; no masses,  no organomegaly Extremities: extremities normal, atraumatic, no cyanosis or edema Skin: no rashes Neurologic: Mental status: Alert, oriented, thought content appropriate Psych: Pleasant  ASSESSMENT & PLAN:    1. Mixed dyslipidemia 2. Metabolic syndrome 3. Type 2 diabetes with nephropathy 4. End-stage renal disease on hemodialysis-undergoing transplant work-up 5. NAFLD  [name] is a young female with a history of poorly controlled diabetes however hemoglobin A1c is now 7.1.  She has developed end-stage renal disease thought secondary to diabetes and is undergoing work-up for possible renal transplant.  She has elevated cholesterol particularly high triglycerides which may be worsened by her end-stage renal disease.  This may improve somewhat after transplantation.  With regards to lipid management, as she is in the high risk category her goal LDL is less than 70, and that would be my initial target.  The data on cholesterol management and end-stage renal disease is minimal both with statins.  Additionally, the new data with Vascepa could not be applied to this population.  Fibrates are contraindicated in end-stage renal disease.  We did discuss the fact that although she is end-stage renal disease due to a young age if she were to have successful transplant, there may be some benefit toward long-term prevention of atherosclerotic cardiovascular disease.  Based on this, I am recommending we start rosuvastatin 20 mg daily with a goal reduction of LDL to less than 70.  We will also need to follow her liver enzymes given a history of fatty liver disease.  It would also be expected for triglycerides to come down as well with a statin.  She is advised to watch out for statin side effects particularly  myalgias and contact us if she has any side effects.  Thanks again for the kind referral.  COVID-19 Education: The signs and symptoms of COVID-19 were discussed with the patient and how to seek Campos for testing (follow up with PCP or arrange E-visit).  The importance of social distancing was discussed today.  Patient Risk:  After full review of this patients clinical status, I feel that they are at least moderate risk at this time.  Time:   Today, I have spent 25 minutes with the patient with telehealth technology discussing cholesterol management, coronary artery disease, end-stage renal disease, diabetes.     Medication Adjustments/Labs and Tests Ordered: Current medicines are reviewed at length with the patient today.  Concerns regarding medicines are outlined above.   Tests Ordered: Orders Placed This Encounter  Procedures   Hepatic function panel   Lipid panel    Medication Changes: Meds ordered this encounter  Medications   rosuvastatin (CRESTOR) 20 MG tablet    Sig: Take 1 tablet (20 mg total) by mouth daily.    Dispense:  90 tablet    Refill:  3    Disposition:  in 3 month(s)  Pixie Casino, MD, [facility], [place] Director of the Advanced Lipid Disorders &  Cardiovascular Risk Reduction Clinic Diplomate of the American Board of Clinical Lipidology Attending Cardiologist  Direct Dial: [phone]   Fax: [phone]  Website:  [email or web address]  Pixie Casino, MD  09/19/2018 11:23 AM

## 2018-09-20 ENCOUNTER — Encounter (HOSPITAL_COMMUNITY): Payer: Self-pay | Admitting: Emergency Medicine

## 2018-09-20 ENCOUNTER — Emergency Department (HOSPITAL_COMMUNITY): Payer: BLUE CROSS/BLUE SHIELD

## 2018-09-20 ENCOUNTER — Emergency Department (HOSPITAL_COMMUNITY)
Admission: EM | Admit: 2018-09-20 | Discharge: 2018-09-21 | Disposition: A | Discharge status: Home / Self Care | Payer: BLUE CROSS/BLUE SHIELD | Attending: Emergency Medicine | Admitting: Emergency Medicine

## 2018-09-20 ENCOUNTER — Other Ambulatory Visit: Payer: Self-pay

## 2018-09-20 DIAGNOSIS — I10 Essential (primary) hypertension: Secondary | ICD-10-CM | POA: Diagnosis not present

## 2018-09-20 DIAGNOSIS — E1122 Type 2 diabetes mellitus with diabetic chronic kidney disease: Secondary | ICD-10-CM | POA: Diagnosis not present

## 2018-09-20 DIAGNOSIS — R000 Tachycardia, unspecified: Secondary | ICD-10-CM | POA: Diagnosis not present

## 2018-09-20 DIAGNOSIS — G510 Bell's palsy: Secondary | ICD-10-CM | POA: Insufficient documentation

## 2018-09-20 DIAGNOSIS — N2581 Secondary hyperparathyroidism of renal origin: Secondary | ICD-10-CM | POA: Diagnosis not present

## 2018-09-20 DIAGNOSIS — E876 Hypokalemia: Secondary | ICD-10-CM | POA: Diagnosis not present

## 2018-09-20 DIAGNOSIS — I120 Hypertensive chronic kidney disease with stage 5 chronic kidney disease or end stage renal disease: Secondary | ICD-10-CM | POA: Insufficient documentation

## 2018-09-20 DIAGNOSIS — Z79899 Other long term (current) drug therapy: Secondary | ICD-10-CM | POA: Diagnosis not present

## 2018-09-20 DIAGNOSIS — Z794 Long term (current) use of insulin: Secondary | ICD-10-CM | POA: Diagnosis not present

## 2018-09-20 DIAGNOSIS — Z992 Dependence on renal dialysis: Secondary | ICD-10-CM | POA: Diagnosis not present

## 2018-09-20 DIAGNOSIS — R51 Headache: Secondary | ICD-10-CM | POA: Diagnosis not present

## 2018-09-20 DIAGNOSIS — R29810 Facial weakness: Secondary | ICD-10-CM | POA: Diagnosis not present

## 2018-09-20 DIAGNOSIS — N186 End stage renal disease: Secondary | ICD-10-CM | POA: Diagnosis not present

## 2018-09-20 LAB — DIFFERENTIAL
Abs Immature Granulocytes: 0.02 10*3/uL (ref 0.00–0.07)
Basophils Absolute: 0 10*3/uL (ref 0.0–0.1)
Basophils Relative: 1 %
Eosinophils Absolute: 0.2 10*3/uL (ref 0.0–0.5)
Eosinophils Relative: 3 %
Immature Granulocytes: 0 %
Lymphocytes Relative: 37 %
Lymphs Abs: 2.1 10*3/uL (ref 0.7–4.0)
Monocytes Absolute: 0.4 10*3/uL (ref 0.1–1.0)
Monocytes Relative: 8 %
Neutro Abs: 2.9 10*3/uL (ref 1.7–7.7)
Neutrophils Relative %: 51 %

## 2018-09-20 LAB — CBC
HCT: 37.7 % (ref 36.0–46.0)
Hemoglobin: 12.9 g/dL (ref 12.0–15.0)
MCH: 31.2 pg (ref 26.0–34.0)
MCHC: 34.2 g/dL (ref 30.0–36.0)
MCV: 91.1 fL (ref 80.0–100.0)
Platelets: 210 10*3/uL (ref 150–400)
RBC: 4.14 MIL/uL (ref 3.87–5.11)
RDW: 13 % (ref 11.5–15.5)
WBC: 5.6 10*3/uL (ref 4.0–10.5)
nRBC: 0 % (ref 0.0–0.2)

## 2018-09-20 LAB — PROTIME-INR
INR: 1.1 (ref 0.8–1.2)
Prothrombin Time: 13.8 seconds (ref 11.4–15.2)

## 2018-09-20 LAB — APTT: aPTT: 31 seconds (ref 24–36)

## 2018-09-20 MED ORDER — NAPHAZOLINE-GLYCERIN 0.012-0.2 % OP SOLN
1.0000 [drp] | Freq: Four times a day (QID) | OPHTHALMIC | Status: DC | PRN
  Administered 2018-09-20: 2 [drp] via OPHTHALMIC
  Filled 2018-09-20: qty 15

## 2018-09-20 NOTE — ED Notes (Signed)
Patient transported to CT 

## 2018-09-20 NOTE — ED Triage Notes (Signed)
BIB [facility] from home with c/o of right sided facial droop starting on Sunday. With HA beginning this morning. Pt spoke with [employer] nurse this evening who advised she come to the ED. Pt is dialysis pt who received full tx today.

## 2018-09-20 NOTE — ED Provider Notes (Signed)
[facility] EMERGENCY DEPARTMENT Provider Note   CSN: [id number] Arrival date & time: 09/20/18  2235    History   Chief Complaint Chief Complaint  Patient presents with  . Stroke Symptoms    HPI Cassandra Campos is a 48 y.o. female.     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  Patient states that 3 days ago she developed a numb sensation on the right side of her tongue.  The following day she developed some weakness in her face and noted that liquids were dribbling through the side of her mouth.  This persisted.  She has had a headache during this time.  Patient was seen at dialysis on Monday and Wednesday (today is Wednesday).  Patient spoke with a family member tonight who was concerned that she is having a stroke.  She now called her insurance company's nurse hotline and she was encouraged to come to the emergency department.  No fevers, cough, shortness of breath or contacts with COVID-19.  Patient is not had any nausea, vomiting or diarrhea.  She denies any difficulty walking, vision changes or loss of vision, difficulty with speech.  She is never had any symptoms like this in the past.     Past Medical History:  Diagnosis Date  . Anemia associated with chronic renal failure   . ASCUS of cervix with negative high risk HPV 06/2017  . CKD (chronic kidney disease), stage IV ([place]) no dialysis yet   nephrologist-  dr Jamal Maes-- scheduled next visit 09-08-2017 (lov 07-05-2017)  . Diabetic retinopathy ([place])   . Dialysis patient ([place])   . Elevated transaminase level   . Fatty liver    FOLLOWED BY DR Henrene Pastor  . Hypertension   . Idiopathic gout of multiple sites    09-05-2017 per pt stable  . Insulin dependent type 2 diabetes mellitus ([place])    followed by dr Toy Care  . Mixed hyperlipidemia   . OSA (obstructive sleep apnea)     per study 10-20-2015 mild osa w/ AHI 10.3/hr;  09-05-2017 per pt has not used cpap since 1 yr ago  . Peripheral neuropathy    feet  . S/P arteriovenous (AV) fistula creation 07-04-2015  w/ revision 02-14-2017   left braniocephilic  . Trigger thumb, right thumb   . Umbilical hernia     Patient Active Problem List   Diagnosis Date Noted  . Morbid obesity ([place]) 08/31/2018  . Anemia of chronic kidney failure, stage 5 ([facility]) 08/31/2018  . OSA (obstructive sleep apnea) 08/31/2018  . ESRD on hemodialysis ([place]) 08/31/2018  . End stage renal failure on dialysis ([place]) 07/11/2018  . Obstructive sleep apnea 07/04/2018  . Palpitations 07/04/2018  . Atypical chest pain 05/18/2018  . 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 Poor compliance with CPAP treatment 08/02/2016  . Acute idiopathic gout of multiple sites 05/13/2016  . Chronic kidney disease (CKD), stage IV (severe) ([place]) 08/19/2015  . Diabetes mellitus due to underlying condition with other diabetic kidney complication ([place]) 61/60/7371  . CKD (chronic kidney disease) stage 3, GFR 30-59 ml/min ([facility]) 03/18/2015  . Morbid obesity due to excess calories ([place]) 03/18/2015  . Excessive daytime sleepiness 03/18/2015  . Insomnia w/ sleep apnea 03/18/2015  . Hypersomnia with sleep apnea 03/18/2015  . Bacterial vaginosis 10/11/2012  . Type II or unspecified type diabetes mellitus without mention of complication, uncontrolled   .  High triglycerides   . Hypertension     Past Surgical History:  Procedure Laterality Date  . ABDOMINAL HYSTERECTOMY  07/11/2000   dr gott   TAH/BSO for irregular bleeding and leiomyoma  . AV FISTULA PLACEMENT Left 07/04/2015   Procedure: ARTERIOVENOUS (AV) FISTULA CREATION-LEFT;  Surgeon: Mal Misty, MD;  Location: [facility];  Service: Vascular;  Laterality: Left;  . BREAST EXCISIONAL BIOPSY Right    benign  . CARPAL TUNNEL RELEASE Bilateral 1993 and 1994  . CESAREAN SECTION  1993:  09-25-1999   BILATERAL TUBAL  LIGATION W/ LAST C/S  . DILATION AND CURETTAGE OF UTERUS    . EXPLORATORY LAPARTOMY / LYSIS ADHESIONS/  BILATERAL SALPINGOOPHORECTOMY  07-20-2011  dr s. Rhodia Albright  [facility]  . FISTULA SUPERFICIALIZATION Left 08/28/2015   Procedure: BRACHIOCEPHALIC FISTULA SUPERFICIALIZATION;  Surgeon: Mal Misty, MD;  Location: [facility];  Service: Vascular;  Laterality: Left;  . PATCH ANGIOPLASTY Left 02/14/2017   Procedure: PATCH ANGIOPLASTY;  Surgeon: Elam Dutch, MD;  Location: [facility];  Service: Vascular;  Laterality: Left;  . PELVIC LAPAROSCOPY  1994   exploration for ectopic preg.  Marland Kitchen RETINAL DETACHMENT SURGERY Left 2015  . REVISON OF ARTERIOVENOUS FISTULA Left 02/14/2017   Procedure: REVISON OF ARTERIOVENOUS FISTULA  LEFT ARM;  Surgeon: Elam Dutch, MD;  Location: [facility];  Service: Vascular;  Laterality: Left;  . TRIGGER FINGER RELEASE Left 2015   thumb  . TRIGGER FINGER RELEASE Right 09/09/2017   Procedure: RELEASE TRIGGER FINGER/A-1 PULLEY RIGHT THUMB;  Surgeon: Dorna Leitz, MD;  Location: [facility];  Service: Orthopedics;  Laterality: Right;     OB History    Gravida  5   Para  2   Term  2   Preterm      AB  3   Living  2     SAB      TAB      Ectopic      Multiple      Live Births               Home Medications    Prior to Admission medications   Medication Sig Start Date End Date Taking? Authorizing Provider  allopurinol (ZYLOPRIM) 300 MG tablet Take 300 mg by mouth every morning.  05/27/16   [provider]  amLODipine (NORVASC) 2.5 MG tablet  09/13/18   [provider]  COLCRYS 0.6 MG tablet TAKE ONE TABLET BY MOUTH ONCE DAILY 08/28/18   Glendale Chard, MD  Continuous Blood Gluc Receiver ([place]) [place] 1 each by Does not apply route 4 (four) times daily. Dx: e11.65 08/23/18   Glendale Chard, MD  Continuous Blood Gluc Sensor (DEXCOM G6 SENSOR) MISC 1 each by Does not apply route QID. 08/23/18   Glendale Chard, MD  Continuous  Blood Gluc Transmit (DEXCOM G6 TRANSMITTER) MISC 1 each by Does not apply route QID. 08/24/18   Glendale Chard, MD  cromolyn (OPTICROM) 4 % ophthalmic solution INSTILL 1 DROP INTO BOTH EYES FOUR TIMES A DAY 03/15/18   [provider]  EASY COMFORT PEN NEEDLES 31G X 8 MM MISC INJECT TWICE A DAY AS DIRECTED 03/15/18   [provider]  glucose blood (CONTOUR NEXT TEST) test strip Use as instructed to check blood sugars 4 times per day dx: e11.22 09/19/18   Glendale Chard, MD  HUMULIN R 500 UNIT/ML injection 180 units in the morning and 140 units in the evening before meals 03/27/18  Glendale Chard, MD  lanthanum (FOSRENOL) 1000 MG chewable tablet Chew 1,000 mg by mouth 3 (three) times daily with meals.    [provider]  lidocaine-prilocaine (EMLA) cream USE ON [place] AS NEEDED FOR DIALYSIS 03/27/18   [provider]  OZEMPIC, 0.25 OR 0.5 MG/DOSE, 2 MG/1.5ML SOPN INJECT 0.5 MG INTO THE SKIN ONCE A WEEK. 08/01/18   Glendale Chard, MD  pravastatin (PRAVACHOL) 40 MG tablet TAKE 1 TABLET BY ORAL ROUTE EVERY DAY 08/30/18   Glendale Chard, MD  pregabalin (LYRICA) 150 MG capsule TAKE ONE CAPSULE BY MOUTH ONCE DAILY 07/05/18   Glendale Chard, MD  RESTASIS 0.05 % ophthalmic emulsion INSTILL 1 DROP INTO BOTH EYES TWICE A DAY 03/14/18   [provider]  rosuvastatin (CRESTOR) 20 MG tablet Take 1 tablet (20 mg total) by mouth daily. 09/19/18 12/18/18  Pixie Casino, MD    Family History Family History  Problem Relation Age of Onset  . Diabetes Mother   . Hypertension Mother   . Diabetes Father   . Hypertension Father   . Cancer Father        STOMACH  . Stomach cancer Father   . Stroke Maternal Grandmother   . Stroke Maternal Grandfather   . Colon cancer Neg Hx   . Rectal cancer Neg Hx     Social History Social History   Tobacco Use  . Smoking status: Never Smoker  . Smokeless tobacco: Never Used  Substance Use Topics  . Alcohol use: Never    Alcohol/week:  0.0 standard drinks    Frequency: Never  . Drug use: No     Allergies   Patient has no known allergies.   Review of Systems Review of Systems  Constitutional: Negative for fever.  HENT: Negative for congestion, dental problem, rhinorrhea and sinus pressure.   Eyes: Positive for pain and redness. Negative for photophobia, discharge and visual disturbance.  Respiratory: Negative for shortness of breath.   Cardiovascular: Negative for chest pain.  Gastrointestinal: Negative for nausea and vomiting.  Musculoskeletal: Negative for gait problem, neck pain and neck stiffness.  Skin: Negative for rash.  Neurological: Positive for facial asymmetry and headaches. Negative for syncope, speech difficulty, weakness, light-headedness and numbness.  Psychiatric/Behavioral: Negative for confusion.     Physical Exam Updated Vital Signs BP (!) 144/78 (BP Location: Right Arm)   Pulse (!) 103   Temp 98.5 F (36.9 C) (Oral)   Resp 16   Ht 5\' 2"  (1.575 m)   Wt 103.4 kg   SpO2 100%   BMI 41.70 kg/m   Physical Exam Vitals signs and nursing note reviewed.  Constitutional:      Appearance: She is well-developed.  HENT:     Head: Normocephalic and atraumatic.     Right Ear: Tympanic membrane, ear canal and external ear normal.     Left Ear: Tympanic membrane, ear canal and external ear normal.     Nose: Nose normal.     Mouth/Throat:     Pharynx: Uvula midline.  Eyes:     General: Lids are normal.     Extraocular Movements:     Right eye: No nystagmus.     Left eye: No nystagmus.     Conjunctiva/sclera:     Right eye: Right conjunctiva is injected. No chemosis or exudate.    Left eye: Left conjunctiva is not injected. No chemosis or exudate.    Pupils: Pupils are equal, round, and reactive to light.  Neck:  Musculoskeletal: Normal range of motion and neck supple.     Comments: No carotid bruit. Cardiovascular:     Rate and Rhythm: Normal rate and regular rhythm.  Pulmonary:      Effort: Pulmonary effort is normal.     Breath sounds: Normal breath sounds.  Abdominal:     Palpations: Abdomen is soft.     Tenderness: There is no abdominal tenderness.  Musculoskeletal:     Cervical back: She exhibits normal range of motion, no tenderness and no bony tenderness.  Skin:    General: Skin is warm and dry.  Neurological:     Mental Status: She is alert and oriented to person, place, and time.     GCS: GCS eye subscore is 4. GCS verbal subscore is 5. GCS motor subscore is 6.     Cranial Nerves: Cranial nerve deficit present.     Sensory: No sensory deficit.     Coordination: Coordination normal.     Gait: Gait normal.     Deep Tendon Reflexes: Reflexes are normal and symmetric.     Comments: Mild right-sided facial weakness with smiling.  Forehead is relatively spared when she raises her eyebrows.  No tongue deviation with protrusion.      ED Treatments / Results  Labs (all labs ordered are listed, but only abnormal results are displayed) Labs Reviewed  COMPREHENSIVE METABOLIC PANEL - Abnormal; Notable for the following components:      Result Value   Potassium 2.9 (*)    Chloride 92 (*)    Glucose, Bld 211 (*)    Creatinine, Ser 3.85 (*)    GFR calc non Af Amer 13 (*)    GFR calc Af Amer 15 (*)    All other components within normal limits  URINALYSIS, ROUTINE W REFLEX MICROSCOPIC - Abnormal; Notable for the following components:   APPearance CLOUDY (*)    Glucose, UA >=500 (*)    Hgb urine dipstick MODERATE (*)    Protein, ur >=300 (*)    Leukocytes,Ua MODERATE (*)    Bacteria, UA MANY (*)    All other components within normal limits  URINE CULTURE  PROTIME-INR  APTT  CBC  DIFFERENTIAL  RAPID URINE DRUG SCREEN, HOSP PERFORMED    EKG None  Radiology No results found.  Procedures Procedures (including critical care time)  Medications Ordered in ED Medications - No data to display   Initial Impression / Assessment and Plan / ED Course  I  have reviewed the triage vital signs and the nursing notes.  Pertinent labs & imaging results that were available during my care of the patient were reviewed by me and considered in my medical decision making (see chart for details).        Patient seen and examined. DDx: CVA vs Bell's palsy. Will proceed with CVA work-up.   Vital signs reviewed and are as follows: BP (!) 144/78 (BP Location: Right Arm)   Pulse (!) 103   Temp 98.5 F (36.9 C) (Oral)   Resp 16   Ht 5\' 2"  (1.575 m)   Wt 103.4 kg   SpO2 100%   BMI 41.70 kg/m   11:32 PM CT images personally reviewed and interpreted.  No ICH noted.  Pt discussed with Dr. Leonette Monarch who will see.   1:24 AM Dr. Leonette Monarch has seen previously. Agrees likely Bell's palsy.  Remainder of work-up shows mild hypokalemia.  Patient was given small 20 mEq oral potassium.  No concerning EKG findings and  this will likely increase given patient's hemodialysis status.  She can have this rechecked at her next dialysis session.  Patient does produce some urine.  There are some white blood cells noted.  Culture sent.  Patient is asymptomatic from this standpoint.  Symptoms began greater than 72 hours ago and patient is diabetic.  For this reason, do not feel that steroids are indicated. House-Brackmann grade II-III and antiviral medications are not indicated.  Patient and I discussed need for good eye care.  She has been provided with eyedrops and she will use these 4 times a day.  Discussed need to tape her eye when sleeping or at other times if eye is irritated and does not pose a safety concern.  Encouraged PCP follow-up for recheck in 1 week. Patient counseled to return if they have weakness in their arms or legs, slurred speech, trouble walking or talking, confusion, trouble with their balance, or if they have any other concerns. Patient verbalizes understanding and agrees with plan.     Final Clinical Impressions(s) / ED Diagnoses   Final diagnoses:   Bell's palsy  Hypokalemia  ESRD (end stage renal disease) ([place])   Patient with gradual onset of Bell's palsy-like symptoms 3 to 4 days ago.  Patient does not have any extremity symptoms, balance issues, vision changes, speech issues.  Symptoms are isolated to the face in the facial nerve distribution.  Head CT is negative for bleeding.  Neurological exam is otherwise unremarkable.  Mild hypokalemia, treated keeping in mind patient's hemodialysis status.  ED Discharge Orders    None       Carlisle Cater, Hershal Coria 09/21/18 0128    Fatima Blank, MD 09/21/18 [phone]

## 2018-09-21 LAB — RAPID URINE DRUG SCREEN, HOSP PERFORMED
Amphetamines: NOT DETECTED
Barbiturates: NOT DETECTED
Benzodiazepines: NOT DETECTED
Cocaine: NOT DETECTED
Opiates: NOT DETECTED
Tetrahydrocannabinol: NOT DETECTED

## 2018-09-21 LAB — URINALYSIS, ROUTINE W REFLEX MICROSCOPIC
Bilirubin Urine: NEGATIVE
Glucose, UA: >=500 mg/dL — AB
Ketones, ur: NEGATIVE mg/dL
Nitrite: NEGATIVE
Protein, ur: >=300 mg/dL — AB
Specific Gravity, Urine: 1.018 (ref 1.005–1.030)
pH: 5 (ref 5.0–8.0)

## 2018-09-21 LAB — COMPREHENSIVE METABOLIC PANEL
ALT: 42 U/L (ref 0–44)
AST: 33 U/L (ref 15–41)
Albumin: 3.9 g/dL (ref 3.5–5.0)
Alkaline Phosphatase: 100 U/L (ref 38–126)
Anion gap: 15 (ref 5–15)
BUN: 17 mg/dL (ref 6–20)
CO2: 29 mmol/L (ref 22–32)
Calcium: 9 mg/dL (ref 8.9–10.3)
Chloride: 92 mmol/L — ABNORMAL LOW (ref 98–111)
Creatinine, Ser: 3.85 mg/dL — ABNORMAL HIGH (ref 0.44–1.00)
GFR calc Af Amer: 15 mL/min — ABNORMAL LOW (ref >60)
GFR calc non Af Amer: 13 mL/min — ABNORMAL LOW (ref >60)
Glucose, Bld: 211 mg/dL — ABNORMAL HIGH (ref 70–99)
Potassium: 2.9 mmol/L — ABNORMAL LOW (ref 3.5–5.1)
Sodium: 136 mmol/L (ref 135–145)
Total Bilirubin: 0.4 mg/dL (ref 0.3–1.2)
Total Protein: 7.7 g/dL (ref 6.5–8.1)

## 2018-09-21 MED ORDER — POTASSIUM CHLORIDE CRYS ER 20 MEQ PO TBCR
20.0000 meq | EXTENDED_RELEASE_TABLET | Freq: Once | ORAL | Status: AC
  Administered 2018-09-21: 20 meq via ORAL
  Filled 2018-09-21: qty 1

## 2018-09-21 NOTE — ED Notes (Signed)
Patient verbalizes understanding of discharge instructions. Opportunity for questioning and answers were provided. Armband removed by staff, pt discharged from ED in wheelchair.  

## 2018-09-21 NOTE — Discharge Instructions (Signed)
Please read and follow all provided instructions.  Your diagnoses today include:  1. Bell's palsy   2. Hypokalemia   3. ESRD (end stage renal disease) ([place])     Tests performed today include:  Head CT -no signs of stroke  Blood counts and electrolytes -slightly low potassium  EKG  Vital signs. See below for your results today.   Medications prescribed:   None  Take any prescribed medications only as directed.  Home care instructions:  Follow any educational materials contained in this packet.  Continue the use of eyedrops 4 times a day.  Keep eye taped when sleeping or when otherwise able.  Follow-up instructions: Please follow-up with your primary care provider in the next 7 days for further evaluation of your symptoms.   Return instructions:   Please return to the Emergency Department if you experience worsening symptoms.   Return if you have weakness in your arms or legs, slurred speech, trouble walking or talking, confusion, or trouble with your balance.   Please return if you have any other emergent concerns.  Additional Information:  Your vital signs today were: BP 119/78    Pulse (!) 101    Temp 98.5 F (36.9 C) (Oral)    Resp 16    Ht 5\' 2"  (1.575 m)    Wt 103.4 kg    SpO2 98%    BMI 41.70 kg/m  If your blood pressure (BP) was elevated above 135/85 this visit, please have this repeated by your doctor within one month. --------------

## 2018-09-22 ENCOUNTER — Telehealth: Payer: Self-pay

## 2018-09-22 DIAGNOSIS — N186 End stage renal disease: Secondary | ICD-10-CM | POA: Diagnosis not present

## 2018-09-22 DIAGNOSIS — N2581 Secondary hyperparathyroidism of renal origin: Secondary | ICD-10-CM | POA: Diagnosis not present

## 2018-09-22 LAB — URINE CULTURE

## 2018-09-22 NOTE — Telephone Encounter (Signed)
Pt consented to virtual visit 09/22/18

## 2018-09-25 ENCOUNTER — Ambulatory Visit (INDEPENDENT_AMBULATORY_CARE_PROVIDER_SITE_OTHER): Payer: BLUE CROSS/BLUE SHIELD | Admitting: Neurology

## 2018-09-25 ENCOUNTER — Other Ambulatory Visit: Payer: Self-pay

## 2018-09-25 DIAGNOSIS — G4733 Obstructive sleep apnea (adult) (pediatric): Secondary | ICD-10-CM | POA: Diagnosis not present

## 2018-09-25 DIAGNOSIS — Z992 Dependence on renal dialysis: Secondary | ICD-10-CM

## 2018-09-25 DIAGNOSIS — D631 Anemia in chronic kidney disease: Secondary | ICD-10-CM

## 2018-09-25 DIAGNOSIS — N185 Chronic kidney disease, stage 5: Secondary | ICD-10-CM

## 2018-09-25 DIAGNOSIS — N2581 Secondary hyperparathyroidism of renal origin: Secondary | ICD-10-CM | POA: Diagnosis not present

## 2018-09-25 DIAGNOSIS — R51 Headache: Secondary | ICD-10-CM | POA: Diagnosis not present

## 2018-09-25 DIAGNOSIS — G4719 Other hypersomnia: Secondary | ICD-10-CM

## 2018-09-25 DIAGNOSIS — Z91199 Patient's noncompliance with other medical treatment and regimen due to unspecified reason: Secondary | ICD-10-CM

## 2018-09-25 DIAGNOSIS — Z9119 Patient's noncompliance with other medical treatment and regimen: Secondary | ICD-10-CM

## 2018-09-25 DIAGNOSIS — N186 End stage renal disease: Secondary | ICD-10-CM

## 2018-09-26 ENCOUNTER — Ambulatory Visit (INDEPENDENT_AMBULATORY_CARE_PROVIDER_SITE_OTHER): Payer: BLUE CROSS/BLUE SHIELD | Admitting: Internal Medicine

## 2018-09-26 ENCOUNTER — Other Ambulatory Visit: Payer: Self-pay

## 2018-09-26 ENCOUNTER — Encounter: Payer: Self-pay | Admitting: Internal Medicine

## 2018-09-26 VITALS — BP 134/84 | Ht 62.0 in | Wt 226.0 lb

## 2018-09-26 DIAGNOSIS — N186 End stage renal disease: Secondary | ICD-10-CM

## 2018-09-26 DIAGNOSIS — R42 Dizziness and giddiness: Secondary | ICD-10-CM | POA: Diagnosis not present

## 2018-09-26 DIAGNOSIS — Z992 Dependence on renal dialysis: Secondary | ICD-10-CM

## 2018-09-26 DIAGNOSIS — E1165 Type 2 diabetes mellitus with hyperglycemia: Secondary | ICD-10-CM

## 2018-09-26 DIAGNOSIS — E1122 Type 2 diabetes mellitus with diabetic chronic kidney disease: Secondary | ICD-10-CM | POA: Diagnosis not present

## 2018-09-26 DIAGNOSIS — G510 Bell's palsy: Secondary | ICD-10-CM

## 2018-09-26 DIAGNOSIS — IMO0002 Reserved for concepts with insufficient information to code with codable children: Secondary | ICD-10-CM

## 2018-09-26 MED ORDER — CHLORPHEN-PE-ACETAMINOPHEN 4-10-325 MG PO TABS: 4.0000 mg | ORAL_TABLET | Freq: Two times a day (BID) | ORAL | 0 refills | Status: DC | PRN

## 2018-09-26 NOTE — Patient Instructions (Signed)
Vertigo    Vertigo means that you feel like you are moving when you are not. Vertigo can also make you feel like things around you are moving when they are not. This feeling can come and go at any time. Vertigo often goes away on its own.  Follow these instructions at home:  · Avoid making fast movements.  · Avoid driving.  · Avoid using heavy machinery.  · Avoid doing any task or activity that might cause danger to you or other people if you would have a vertigo attack while you are doing it.  · Sit down right away if you feel dizzy or have trouble with your balance.  · Take over-the-counter and prescription medicines only as told by your doctor.  · Follow instructions from your doctor about which positions or movements you should avoid.  · Drink enough fluid to keep your pee (urine) clear or pale yellow.  · Keep all follow-up visits as told by your doctor. This is important.  Contact a doctor if:  · Medicine does not help your vertigo.  · You have a fever.  · Your problems get worse or you have new symptoms.  · Your family or friends see changes in your behavior.  · You feel sick to your stomach (nauseous) or you throw up (vomit).  · You have a “pins and needles” feeling or you are numb in part of your body.  Get help right away if:  · You have trouble moving or talking.  · You are always dizzy.  · You pass out (faint).  · You get very bad headaches.  · You feel weak or have trouble using your hands, arms, or legs.  · You have changes in your hearing.  · You have changes in your seeing (vision).  · You get a stiff neck.  · Bright light starts to bother you.  This information is not intended to replace advice given to you by your health care provider. Make sure you discuss any questions you have with your health care provider.  Document Released: 02/10/2008 Document Revised: 10/09/2015 Document Reviewed: 08/26/2014  Elsevier Interactive Patient Education © 2019 [employer]

## 2018-09-26 NOTE — Progress Notes (Signed)
Virtual Visit via Video   This visit type was conducted due to national recommendations for restrictions regarding the COVID-19 Pandemic (e.g. social distancing) in an effort to limit this patient's exposure and mitigate transmission in our community.  Due to her co-morbid illnesses, this patient is at least at moderate risk for complications without adequate follow up.  This format is felt to be most appropriate for this patient at this time.  All issues noted in this document were discussed and addressed.  A limited physical exam was performed with this format.    This visit type was conducted due to national recommendations for restrictions regarding the COVID-19 Pandemic (e.g. social distancing) in an effort to limit this patient's exposure and mitigate transmission in our community.  Patients identity confirmed using two different identifiers.  This format is felt to be most appropriate for this patient at this time.  All issues noted in this document were discussed and addressed.  No physical exam was performed (except for noted visual exam findings with Video Visits).    Date:  09/26/2018   ID:  Cassandra Campos, DOB [birth year], MRN [id number]  Patient Location:  Home  Provider location:   Office    Chief Complaint:  ER f/u  History of Present Illness:    Cassandra Campos is a 48 y.o. female who presents via video conferencing for a telehealth visit today.    The patient does not have symptoms concerning for COVID-19 infection (fever, chills, cough, or new shortness of breath).   She presents today for virtual visit. She prefers this method of contact due to COVID-19 pandemic.  She presents for ER f/u. She presented to [facility] on 5/6 for further evaluation of facial weakness and tongue numbness. She was diagnosed with Bell's palsy. She has not had any improvement in her symptoms. She reports she is still dizzy as well. She denies headaches, chest pain and shortness  of breath. She denies UE/LE weakness.     Past Medical History:  Diagnosis Date   Anemia associated with chronic renal failure    ASCUS of cervix with negative high risk HPV 06/2017   CKD (chronic kidney disease), stage IV ([place]) no dialysis yet   nephrologist-  dr Jamal Maes-- scheduled next visit 09-08-2017 (lov 07-05-2017)   Diabetic retinopathy ([place])    Dialysis patient ([place])    Elevated transaminase level    Fatty liver    FOLLOWED BY DR Henrene Pastor   Hypertension    Idiopathic gout of multiple sites    09-05-2017 per pt stable   Insulin dependent type 2 diabetes mellitus ([place])    followed by dr Toy Care   Mixed hyperlipidemia    OSA (obstructive sleep apnea)    per study 10-20-2015 mild osa w/ AHI 10.3/hr;  09-05-2017 per pt has not used cpap since 1 yr ago   Peripheral neuropathy    feet   S/P arteriovenous (AV) fistula creation 07-04-2015  w/ revision 02-14-2017   left braniocephilic   Trigger thumb, right thumb    Umbilical hernia    Past Surgical History:  Procedure Laterality Date   ABDOMINAL HYSTERECTOMY  07/11/2000   dr gott   TAH/BSO for irregular bleeding and leiomyoma   AV FISTULA PLACEMENT Left 07/04/2015   Procedure: ARTERIOVENOUS (AV) FISTULA CREATION-LEFT;  Surgeon: Mal Misty, MD;  Location: [facility] OR;  Service: Vascular;  Laterality: Left;   BREAST EXCISIONAL BIOPSY Right    benign   CARPAL TUNNEL RELEASE Bilateral  1993 and [place]:  09-25-1999   BILATERAL TUBAL LIGATION W/ LAST C/S   DILATION AND CURETTAGE OF UTERUS     EXPLORATORY LAPARTOMY / LYSIS ADHESIONS/  BILATERAL SALPINGOOPHORECTOMY  07-20-2011  dr s. Rhodia Albright  [facility]   FISTULA SUPERFICIALIZATION Left 08/28/2015   Procedure: BRACHIOCEPHALIC FISTULA SUPERFICIALIZATION;  Surgeon: Mal Misty, MD;  Location: [place];  Service: Vascular;  Laterality: Left;   PATCH ANGIOPLASTY Left 02/14/2017   Procedure: PATCH ANGIOPLASTY;  Surgeon: Elam Dutch, MD;   Location: [place];  Service: Vascular;  Laterality: Left;   PELVIC LAPAROSCOPY  1994   exploration for ectopic preg.   RETINAL DETACHMENT SURGERY Left 2015   REVISON OF ARTERIOVENOUS FISTULA Left 02/14/2017   Procedure: REVISON OF ARTERIOVENOUS FISTULA  LEFT ARM;  Surgeon: Elam Dutch, MD;  Location: [place];  Service: Vascular;  Laterality: Left;   TRIGGER FINGER RELEASE Left 2015   thumb   TRIGGER FINGER RELEASE Right 09/09/2017   Procedure: RELEASE TRIGGER FINGER/A-1 PULLEY RIGHT THUMB;  Surgeon: Dorna Leitz, MD;  Location: [place];  Service: Orthopedics;  Laterality: Right;     Current Meds  Medication Sig   allopurinol (ZYLOPRIM) 300 MG tablet Take 300 mg by mouth every morning.    amLODipine (NORVASC) 2.5 MG tablet    B Complex-C-Folic Acid (RENAL) 1 MG CAPS TAKE 1 CAPSULE BY MOUTH DAILY (ON DIALYSIS DAYS, TAKE AFTER DIALYSIS TREATMENT )   COLCRYS 0.6 MG tablet TAKE ONE TABLET BY MOUTH ONCE DAILY (Patient taking differently: 3 times per week)   cromolyn (OPTICROM) 4 % ophthalmic solution INSTILL 1 DROP INTO BOTH EYES FOUR TIMES A DAY   EASY COMFORT PEN NEEDLES 31G X 8 MM MISC INJECT TWICE A DAY AS DIRECTED   glucose blood (CONTOUR NEXT TEST) test strip Use as instructed to check blood sugars 4 times per day dx: e11.22   HUMULIN R 500 UNIT/ML injection 180 units in the morning and 140 units in the evening before meals   lanthanum (FOSRENOL) 1000 MG chewable tablet Chew 1,000 mg by mouth 3 (three) times daily with meals.   lidocaine-prilocaine (EMLA) cream USE ON ARAMS AS NEEDED FOR DIALYSIS   OZEMPIC, 0.25 OR 0.5 MG/DOSE, 2 MG/1.5ML SOPN INJECT 0.5 MG INTO THE SKIN ONCE A WEEK.   pregabalin (LYRICA) 150 MG capsule TAKE ONE CAPSULE BY MOUTH ONCE DAILY   RESTASIS 0.05 % ophthalmic emulsion INSTILL 1 DROP INTO BOTH EYES TWICE A DAY   rosuvastatin (CRESTOR) 20 MG tablet Take 1 tablet (20 mg total) by mouth daily.     Allergies:   Patient has no  known allergies.   Social History   Tobacco Use   Smoking status: Never Smoker   Smokeless tobacco: Never Used  Substance Use Topics   Alcohol use: Never    Alcohol/week: 0.0 standard drinks    Frequency: Never   Drug use: No     Family Hx: The patient's family history includes Cancer in her father; Diabetes in her father and mother; Hypertension in her father and mother; Stomach cancer in her father; Stroke in her maternal grandfather and maternal grandmother. There is no history of Colon cancer or Rectal cancer.  ROS:   Please see the history of present illness.    Review of Systems  Constitutional: Negative.   Respiratory: Negative.   Cardiovascular: Negative.   Gastrointestinal: Negative.   Neurological: Positive for dizziness and focal weakness.  Psychiatric/Behavioral: Negative.  All other systems reviewed and are negative.   Labs/Other Tests and Data Reviewed:    Recent Labs: 07/11/2018: TSH 0.762 09/20/2018: ALT 42; BUN 17; Creatinine, Ser 3.85; Hemoglobin 12.9; Platelets 210; Potassium 2.9; Sodium 136   Recent Lipid Panel Lab Results  Component Value Date/Time   CHOL 188 07/11/2018 11:17 AM   TRIG 251 (H) 07/11/2018 11:17 AM   HDL 43 07/11/2018 11:17 AM   CHOLHDL 4.4 07/11/2018 11:17 AM   LDLCALC 95 07/11/2018 11:17 AM    Wt Readings from Last 3 Encounters:  09/26/18 226 lb (102.5 kg)  09/20/18 228 lb (103.4 kg)  09/19/18 228 lb (103.4 kg)     Exam:    Vital Signs:  BP 134/84 Comment: pt provided   Ht 5\' 2"  (1.575 m)    Wt 226 lb (102.5 kg) Comment: pt provided   BMI 41.34 kg/m     Physical Exam  Constitutional: She is oriented to person, place, and time and well-developed, well-nourished, and in no distress.  HENT:  Head: Normocephalic and atraumatic.  Neck: Normal range of motion.  Pulmonary/Chest: Effort normal.  Neurological: She is alert and oriented to person, place, and time. A cranial nerve deficit is present.  Psychiatric: Affect  normal.  Nursing note and vitals reviewed. There is right facial weakness. Tongue is midline.   ASSESSMENT & PLAN:     1. Bell's palsy  ER records reviewed in full detail. Pt advised that Valtrex may be helpful. She agrees to check with her nephrologist to confirm dosing. I will send rx once dose is confirmed. At her request, I will also refer her to Neuro for further evaluation.   2. Vertigo  She was given rx norel AD to use twice daily as needed. She is encouraged to stay well hydrated. She will be given a work note for the rest of the week.   3. 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 Importance of dietary compliance was discussed with the patient. She will rto later this month for f/u.    COVID-19 Education: The signs and symptoms of COVID-19 were discussed with the patient and how to seek care for testing (follow up with PCP or arrange E-visit).  The importance of social distancing was discussed today.  Patient Risk:   After full review of this patients clinical status, I feel that they are at least moderate risk at this time.  Time:   Today, I have spent 23 minutes with the patient with telehealth technology discussing above diagnoses.     Medication Adjustments/Labs and Tests Ordered: Current medicines are reviewed at length with the patient today.  Concerns regarding medicines are outlined above.   Tests Ordered: No orders of the defined types were placed in this encounter.   Medication Changes: Meds ordered this encounter  Medications   Chlorphen-PE-Acetaminophen (NOREL AD) 4-10-325 MG TABS    Sig: Take 4-10 mg by mouth 2 (two) times daily as needed.    Dispense:  20 tablet    Refill:  0    Disposition:  Follow up in 6 week(s)  Signed, Maximino Greenland, MD

## 2018-09-27 ENCOUNTER — Encounter: Payer: Self-pay | Admitting: Internal Medicine

## 2018-09-27 DIAGNOSIS — N186 End stage renal disease: Secondary | ICD-10-CM | POA: Diagnosis not present

## 2018-09-27 DIAGNOSIS — R51 Headache: Secondary | ICD-10-CM | POA: Diagnosis not present

## 2018-09-27 DIAGNOSIS — N2581 Secondary hyperparathyroidism of renal origin: Secondary | ICD-10-CM | POA: Diagnosis not present

## 2018-09-29 ENCOUNTER — Other Ambulatory Visit: Payer: Self-pay | Admitting: Internal Medicine

## 2018-09-29 DIAGNOSIS — N186 End stage renal disease: Secondary | ICD-10-CM | POA: Diagnosis not present

## 2018-09-29 DIAGNOSIS — R51 Headache: Secondary | ICD-10-CM | POA: Diagnosis not present

## 2018-09-29 DIAGNOSIS — N2581 Secondary hyperparathyroidism of renal origin: Secondary | ICD-10-CM | POA: Diagnosis not present

## 2018-09-29 MED ORDER — VALACYCLOVIR HCL 500 MG PO TABS: ORAL_TABLET | ORAL | 0 refills | Status: DC

## 2018-09-29 MED ORDER — AMOXICILLIN 500 MG PO CAPS: ORAL_CAPSULE | ORAL | 0 refills | Status: DC

## 2018-10-02 ENCOUNTER — Encounter: Payer: Self-pay | Admitting: Internal Medicine

## 2018-10-02 DIAGNOSIS — N186 End stage renal disease: Secondary | ICD-10-CM | POA: Diagnosis not present

## 2018-10-02 DIAGNOSIS — N2581 Secondary hyperparathyroidism of renal origin: Secondary | ICD-10-CM | POA: Diagnosis not present

## 2018-10-02 DIAGNOSIS — R51 Headache: Secondary | ICD-10-CM | POA: Diagnosis not present

## 2018-10-03 ENCOUNTER — Telehealth: Payer: Self-pay

## 2018-10-03 NOTE — Telephone Encounter (Signed)
The pt was told that Dr. Baird Cancer said that she isn't familiar with bell's parsley causing hair loss.

## 2018-10-04 ENCOUNTER — Other Ambulatory Visit: Payer: Self-pay

## 2018-10-04 ENCOUNTER — Telehealth: Payer: Self-pay | Admitting: Neurology

## 2018-10-04 DIAGNOSIS — N186 End stage renal disease: Secondary | ICD-10-CM | POA: Diagnosis not present

## 2018-10-04 DIAGNOSIS — G510 Bell's palsy: Secondary | ICD-10-CM

## 2018-10-04 DIAGNOSIS — N2581 Secondary hyperparathyroidism of renal origin: Secondary | ICD-10-CM | POA: Diagnosis not present

## 2018-10-04 DIAGNOSIS — R51 Headache: Secondary | ICD-10-CM | POA: Diagnosis not present

## 2018-10-04 NOTE — Telephone Encounter (Signed)
I called pt. I advised pt that Dr. Brett Fairy reviewed their sleep study results and found that pt severe sleep apnea. Dr. Brett Fairy recommends that pt start auto CPAP. I reviewed PAP compliance expectations with the pt. Pt is agreeable to starting a CPAP. I advised pt that an order will be sent to a DME, [facility], and [facility] will call the pt within about one week after they file with the pt's insurance. [facility] will show the pt how to use the machine, fit for masks, and troubleshoot the CPAP if needed. A follow up appt was made for insurance purposes with Ward Givens, NP on July 23,2020 at 7:30 am. Pt verbalized understanding to arrive 15 minutes early and bring their CPAP. A letter with all of this information in it will be mailed to the pt as a reminder. I verified with the pt that the address we have on file is correct. Pt verbalized understanding of results. Pt had no questions at this time but was encouraged to call back if questions arise. I have sent the order to [facility] and have received confirmation that they have received the order.

## 2018-10-06 DIAGNOSIS — N2581 Secondary hyperparathyroidism of renal origin: Secondary | ICD-10-CM | POA: Diagnosis not present

## 2018-10-06 DIAGNOSIS — R51 Headache: Secondary | ICD-10-CM | POA: Diagnosis not present

## 2018-10-06 DIAGNOSIS — N186 End stage renal disease: Secondary | ICD-10-CM | POA: Diagnosis not present

## 2018-10-07 ENCOUNTER — Encounter: Payer: Self-pay | Admitting: Internal Medicine

## 2018-10-07 DIAGNOSIS — N186 End stage renal disease: Secondary | ICD-10-CM | POA: Diagnosis not present

## 2018-10-07 DIAGNOSIS — N2581 Secondary hyperparathyroidism of renal origin: Secondary | ICD-10-CM | POA: Diagnosis not present

## 2018-10-07 DIAGNOSIS — E8779 Other fluid overload: Secondary | ICD-10-CM | POA: Diagnosis not present

## 2018-10-09 DIAGNOSIS — N186 End stage renal disease: Secondary | ICD-10-CM | POA: Diagnosis not present

## 2018-10-09 DIAGNOSIS — N2581 Secondary hyperparathyroidism of renal origin: Secondary | ICD-10-CM | POA: Diagnosis not present

## 2018-10-10 ENCOUNTER — Telehealth: Payer: Self-pay | Admitting: Neurology

## 2018-10-10 ENCOUNTER — Ambulatory Visit: Payer: BLUE CROSS/BLUE SHIELD | Admitting: Internal Medicine

## 2018-10-10 ENCOUNTER — Other Ambulatory Visit: Payer: Self-pay | Admitting: Neurology

## 2018-10-10 DIAGNOSIS — D631 Anemia in chronic kidney disease: Secondary | ICD-10-CM

## 2018-10-10 DIAGNOSIS — N186 End stage renal disease: Secondary | ICD-10-CM

## 2018-10-10 DIAGNOSIS — G4733 Obstructive sleep apnea (adult) (pediatric): Secondary | ICD-10-CM

## 2018-10-10 DIAGNOSIS — G4719 Other hypersomnia: Secondary | ICD-10-CM

## 2018-10-10 NOTE — Telephone Encounter (Signed)
Pt gave consent for VV on the phone/ Pt understands that although there may be some limitations with this type of visit, we will take all precautions to reduce any security or privacy concerns.  Pt understands that this will be treated like an in office visit and we will file with pt's insurance, and there may be a patient responsible charge related to this service. Sent email with provider's link to: [email or web address]

## 2018-10-11 DIAGNOSIS — N186 End stage renal disease: Secondary | ICD-10-CM | POA: Diagnosis not present

## 2018-10-11 DIAGNOSIS — N2581 Secondary hyperparathyroidism of renal origin: Secondary | ICD-10-CM | POA: Diagnosis not present

## 2018-10-12 DIAGNOSIS — N186 End stage renal disease: Secondary | ICD-10-CM | POA: Diagnosis not present

## 2018-10-12 DIAGNOSIS — N2581 Secondary hyperparathyroidism of renal origin: Secondary | ICD-10-CM | POA: Diagnosis not present

## 2018-10-15 DIAGNOSIS — E1122 Type 2 diabetes mellitus with diabetic chronic kidney disease: Secondary | ICD-10-CM | POA: Diagnosis not present

## 2018-10-15 DIAGNOSIS — N186 End stage renal disease: Secondary | ICD-10-CM | POA: Diagnosis not present

## 2018-10-15 DIAGNOSIS — Z992 Dependence on renal dialysis: Secondary | ICD-10-CM | POA: Diagnosis not present

## 2018-10-16 DIAGNOSIS — N186 End stage renal disease: Secondary | ICD-10-CM | POA: Diagnosis not present

## 2018-10-16 DIAGNOSIS — N2581 Secondary hyperparathyroidism of renal origin: Secondary | ICD-10-CM | POA: Diagnosis not present

## 2018-10-18 DIAGNOSIS — N2581 Secondary hyperparathyroidism of renal origin: Secondary | ICD-10-CM | POA: Diagnosis not present

## 2018-10-18 DIAGNOSIS — N186 End stage renal disease: Secondary | ICD-10-CM | POA: Diagnosis not present

## 2018-10-20 DIAGNOSIS — N2581 Secondary hyperparathyroidism of renal origin: Secondary | ICD-10-CM | POA: Diagnosis not present

## 2018-10-20 DIAGNOSIS — N186 End stage renal disease: Secondary | ICD-10-CM | POA: Diagnosis not present

## 2018-10-23 DIAGNOSIS — N186 End stage renal disease: Secondary | ICD-10-CM | POA: Diagnosis not present

## 2018-10-23 DIAGNOSIS — E162 Hypoglycemia, unspecified: Secondary | ICD-10-CM | POA: Diagnosis not present

## 2018-10-23 DIAGNOSIS — N2581 Secondary hyperparathyroidism of renal origin: Secondary | ICD-10-CM | POA: Diagnosis not present

## 2018-10-24 ENCOUNTER — Encounter: Payer: Self-pay | Admitting: Neurology

## 2018-10-24 NOTE — Telephone Encounter (Signed)

## 2018-10-25 DIAGNOSIS — N186 End stage renal disease: Secondary | ICD-10-CM | POA: Diagnosis not present

## 2018-10-25 DIAGNOSIS — N2581 Secondary hyperparathyroidism of renal origin: Secondary | ICD-10-CM | POA: Diagnosis not present

## 2018-10-25 DIAGNOSIS — E162 Hypoglycemia, unspecified: Secondary | ICD-10-CM | POA: Diagnosis not present

## 2018-10-26 ENCOUNTER — Other Ambulatory Visit: Payer: Self-pay

## 2018-10-26 ENCOUNTER — Encounter: Payer: Self-pay | Admitting: Neurology

## 2018-10-26 ENCOUNTER — Ambulatory Visit (INDEPENDENT_AMBULATORY_CARE_PROVIDER_SITE_OTHER): Payer: BC Managed Care – PPO | Admitting: Neurology

## 2018-10-26 DIAGNOSIS — G4730 Sleep apnea, unspecified: Secondary | ICD-10-CM

## 2018-10-26 DIAGNOSIS — G459 Transient cerebral ischemic attack, unspecified: Secondary | ICD-10-CM | POA: Diagnosis not present

## 2018-10-26 DIAGNOSIS — N184 Chronic kidney disease, stage 4 (severe): Secondary | ICD-10-CM

## 2018-10-26 DIAGNOSIS — I150 Renovascular hypertension: Secondary | ICD-10-CM | POA: Diagnosis not present

## 2018-10-26 DIAGNOSIS — G4719 Other hypersomnia: Secondary | ICD-10-CM

## 2018-10-26 DIAGNOSIS — G4700 Insomnia, unspecified: Secondary | ICD-10-CM

## 2018-10-26 NOTE — Progress Notes (Signed)
[name]   Provider:  Larey Seat, M D  Primary Care Physician:  Glendale Chard, MD   Referring Provider:  ED  Virtual Visit via Video Note  I connected with@ on 10/26/18 at  2:00 PM EDT by a video enabled telemedicine application and verified that I am speaking with the correct person using two identifiers.  Location: Patient: at work  Provider: [facility]   I discussed the limitations of evaluation and management by telemedicine and the availability of in person appointments. The patient expressed understanding and agreed to proceed.  Larey Seat, MD HPI:  Cassandra Campos is a 48 y.o. female and seen hon video today for the second time, the first visit was to addressed EDS, now she follows up after an ED visit, she had 8 days of facial droop and numbness. Date of ED visit  09-20-2018, she reported the right facial droop and numbness of the right tongue recovered by now. She felt very dizzy.  She underwent a head CT at the hospital. This was normal. After starting valtrex she  recovered. Because of DM she was not given steroids.    48 year old female with history of hypertension, diabetes, ESRD on dialysis who presents to the emergency department with 4 days of right facial weakness.  On exam patient has right facial droop which includes forehead.  No other focal deficits noted on exam.  Patient is high risk for CVA however this presentation is consistent with Bell's palsy.  No parotid mass noted.  No vesicular rash concerning for shingles or HSV. The patient is safe for discharge with strict return precautions EKG: None   Review of Systems: Out of a complete 14 system review, the patient complains of only the following symptoms, and all other reviewed systems are negative.   How likely are you to doze in the following situations: 0 = not likely, 1 = slight chance, 2 = moderate chance, 3 = high chance  Sitting and Reading? Watching Television? Sitting  inactive in a public place (theater or meeting)? Lying down in the afternoon when circumstances permit? Sitting and talking to someone? Sitting quietly after lunch without alcohol? In a car, while stopped for a few minutes in traffic? As a passenger in a car for an hour without a break?  Total = 16/ 24 awaiting CPAP .  Social History   Socioeconomic History  . Marital status: Married    Spouse name: Not on file  . Number of children: 2  . Years of education: Not on file  . Highest education level: Not on file  Occupational History  . Occupation: unemployed  Social Needs  . Financial resource strain: Not on file  . Food insecurity    Worry: Not on file    Inability: Not on file  . Transportation needs    Medical: Not on file    Non-medical: Not on file  Tobacco Use  . Smoking status: Never Smoker  . Smokeless tobacco: Never Used  Substance and Sexual Activity  . Alcohol use: Never    Alcohol/week: 0.0 standard drinks    Frequency: Never  . Drug use: No  . Sexual activity: Yes    Birth control/protection: Surgical    Comment: HYST-1st intercourse 48 yo-Fewer than 5 partners  Lifestyle  . Physical activity    Days per week: Not on file    Minutes per session: Not on file  . Stress: Not on file  Relationships  . Social connections  Talks on phone: Not on file    Gets together: Not on file    Attends religious service: Not on file    Active member of club or organization: Not on file    Attends meetings of clubs or organizations: Not on file    Relationship status: Not on file  . Intimate partner violence    Fear of current or ex partner: Not on file    Emotionally abused: Not on file    Physically abused: Not on file    Forced sexual activity: Not on file  Other Topics Concern  . Not on file  Social History Narrative  . Not on file    Family History  Problem Relation Age of Onset  . Diabetes Mother   . Hypertension Mother   . Diabetes Father   .  Hypertension Father   . Cancer Father        STOMACH  . Stomach cancer Father   . Stroke Maternal Grandmother   . Stroke Maternal Grandfather   . Colon cancer Neg Hx   . Rectal cancer Neg Hx     Past Medical History:  Diagnosis Date  . Anemia associated with chronic renal failure   . ASCUS of cervix with negative high risk HPV 06/2017  . CKD (chronic kidney disease), stage IV ([place]) no dialysis yet   nephrologist-  dr Jamal Maes-- scheduled next visit 09-08-2017 (lov 07-05-2017)  . Diabetic retinopathy ([place])   . Dialysis patient ([place])   . Elevated transaminase level   . Fatty liver    FOLLOWED BY DR Henrene Pastor  . Hypertension   . Idiopathic gout of multiple sites    09-05-2017 per pt stable  . Insulin dependent type 2 diabetes mellitus ([place])    followed by dr Toy Care  . Mixed hyperlipidemia   . OSA (obstructive sleep apnea)    per study 10-20-2015 mild osa w/ AHI 10.3/hr;  09-05-2017 per pt has not used cpap since 1 yr ago  . Peripheral neuropathy    feet  . S/P arteriovenous (AV) fistula creation 07-04-2015  w/ revision 02-14-2017   left braniocephilic  . Trigger thumb, right thumb   . Umbilical hernia     Past Surgical History:  Procedure Laterality Date  . ABDOMINAL HYSTERECTOMY  07/11/2000   dr gott   TAH/BSO for irregular bleeding and leiomyoma  . AV FISTULA PLACEMENT Left 07/04/2015   Procedure: ARTERIOVENOUS (AV) FISTULA CREATION-LEFT;  Surgeon: Mal Misty, MD;  Location: [place];  Service: Vascular;  Laterality: Left;  . BREAST EXCISIONAL BIOPSY Right    benign  . CARPAL TUNNEL RELEASE Bilateral 1993 and 1994  . CESAREAN SECTION  1993:  09-25-1999   BILATERAL TUBAL LIGATION W/ LAST C/S  . DILATION AND CURETTAGE OF UTERUS    . EXPLORATORY LAPARTOMY / LYSIS ADHESIONS/  BILATERAL SALPINGOOPHORECTOMY  07-20-2011  dr s. Rhodia Albright  [facility]  . FISTULA SUPERFICIALIZATION Left 08/28/2015   Procedure: BRACHIOCEPHALIC FISTULA SUPERFICIALIZATION;  Surgeon: Mal Misty, MD;   Location: [place];  Service: Vascular;  Laterality: Left;  . PATCH ANGIOPLASTY Left 02/14/2017   Procedure: PATCH ANGIOPLASTY;  Surgeon: Elam Dutch, MD;  Location: [place];  Service: Vascular;  Laterality: Left;  . PELVIC LAPAROSCOPY  1994   exploration for ectopic preg.  Marland Kitchen RETINAL DETACHMENT SURGERY Left 2015  . REVISON OF ARTERIOVENOUS FISTULA Left 02/14/2017   Procedure: REVISON OF ARTERIOVENOUS FISTULA  LEFT ARM;  Surgeon: Elam Dutch, MD;  Location[facility]  OR;  Service: Vascular;  Laterality: Left;  . TRIGGER FINGER RELEASE Left 2015   thumb  . TRIGGER FINGER RELEASE Right 09/09/2017   Procedure: RELEASE TRIGGER FINGER/A-1 PULLEY RIGHT THUMB;  Surgeon: Dorna Leitz, MD;  Location: [place];  Service: Orthopedics;  Laterality: Right;    Current Outpatient Medications  Medication Sig Dispense Refill  . allopurinol (ZYLOPRIM) 300 MG tablet Take 300 mg by mouth every morning.   5  . amLODipine (NORVASC) 2.5 MG tablet     . amoxicillin (AMOXIL) 500 MG capsule One capsule daily 7 capsule 0  . B Complex-C-Folic Acid (RENAL) 1 MG CAPS TAKE 1 CAPSULE BY MOUTH DAILY (ON DIALYSIS DAYS, TAKE AFTER DIALYSIS TREATMENT )    . Chlorphen-PE-Acetaminophen (NOREL AD) 4-10-325 MG TABS Take 4-10 mg by mouth 2 (two) times daily as needed. 20 tablet 0  . COLCRYS 0.6 MG tablet TAKE ONE TABLET BY MOUTH ONCE DAILY (Patient taking differently: 3 times per week) 90 tablet 1  . Continuous Blood Gluc Receiver (DEXCOM G6 RECEIVER) DEVI 1 each by Does not apply route 4 (four) times daily. Dx: e11.65 1 Device 1  . Continuous Blood Gluc Sensor (DEXCOM G6 SENSOR) MISC 1 each by Does not apply route QID. 3 each 2  . Continuous Blood Gluc Transmit (DEXCOM G6 TRANSMITTER) MISC 1 each by Does not apply route QID. 2 each 2  . cromolyn (OPTICROM) 4 % ophthalmic solution INSTILL 1 DROP INTO BOTH EYES FOUR TIMES A DAY  2  . EASY COMFORT PEN NEEDLES 31G X 8 MM MISC INJECT TWICE A DAY AS DIRECTED  3  .  glucose blood (CONTOUR NEXT TEST) test strip Use as instructed to check blood sugars 4 times per day dx: e11.22 300 each 2  . HUMULIN R 500 UNIT/ML injection 180 units in the morning and 140 units in the evening before meals 20 mL 11  . lanthanum (FOSRENOL) 1000 MG chewable tablet Chew 1,000 mg by mouth 3 (three) times daily with meals.    . lidocaine-prilocaine (EMLA) cream USE ON [place] AS NEEDED FOR DIALYSIS  10  . OZEMPIC, 0.25 OR 0.5 MG/DOSE, 2 MG/1.5ML SOPN INJECT 0.5 MG INTO THE SKIN ONCE A WEEK. 1 pen 2  . pregabalin (LYRICA) 150 MG capsule TAKE ONE CAPSULE BY MOUTH ONCE DAILY 90 capsule 2  . RESTASIS 0.05 % ophthalmic emulsion INSTILL 1 DROP INTO BOTH EYES TWICE A DAY  3  . rosuvastatin (CRESTOR) 20 MG tablet Take 1 tablet (20 mg total) by mouth daily. 90 tablet 3  . valACYclovir (VALTREX) 500 MG tablet One tablet every other day 30 tablet 0   No current facility-administered medications for this visit.     Allergies as of 10/26/2018  . (No Known Allergies)    Last Weight:  Last Height:  Observation:  General: The patient is awake, alert and appears not in acute distress. The patient is well groomed. Head: Normocephalic, atraumatic. Neck is supple without ROM restriction.  Mallampati grade :    Respiratory: Breath holding was possible for seconds. Skin:  Without evidence of facial edema or rash  Neurologic exam : The patient is awake and alert, oriented to place and time.   Attention span & concentration ability appears normal.  Speech is fluent, without  dysarthria, dysphonia or aphasia.  Mood and affect are appropriate.  Cranial nerves: Pupils are equal in size and round.  Extraocular movements  in vertical and horizontal planes intact. Facial motor strength is symmetric  and tongue and uvula move midline. Shoulder shrug was symmetrical.   Motor exam:   Normal muscle bulk and symmetric ROM ( range of movement) in upper/ lower extremities.  Coordination: Rapid  alternating movements in the fingers/hands were normal. Finger-to-nose maneuver demonstrated no evidence of ataxia, dysmetria or tremor.  Gait and station: Patient walks without assistive device -    Assessment and Plan:  suspected bells palsy but patient reported dysequilibrium with onset of right facial droop, right tongue numbness. hearing and vision were unaffected. She had an ear pain on the right- evolving fast over 2-3 days and recovering over 10 days.   I want to rule out a brainstem stroke, MRI without contrast ordered. This  48 year old AAF has been a high risk for vascular  events. AV fistula, ESRD on Hemodialysis.  Follow Up Instructions: We will call you with the results of the MRI/ MRA and have your CPAP ready for you.    I discussed the assessment and treatment plan with the patient. The patient was provided an opportunity to ask questions and all were answered. The patient agreed with the plan and demonstrated an understanding of the instructions.   The patient was advised to call back or seek an in-person evaluation if the symptoms worsen or if the condition fails to improve as anticipated.  I provided 15 minutes of non-face-to-face time during this encounter.  Larey Seat, MD 2/53/6644, 0:34 PM  Certified in Neurology by ABPN Certified in [place] by [facility] [address] [place], [place] [address]  Last visit :  History of Present Illness:  I had the pleasure of meeting this patient before referred by Dr. Gertie Exon her primary care physician.  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  Good productivity had suffered she make more mistakes she was called by her M workers to be dozing off.  She had endorsed the Epworth score at the time at 16 points.  She then returned for another visit on June 07, 2016 this time she had  developed CKD stage IV related to diabetes-hypertension, we discussed a polysomnography which she had undergone on 10-20-15 when she was diagnosed with rather mild apnea at an AHI of 10.3/h but strong REM sleep exacerbation.  Her REM sleep AHI was 47.2.  Equally strong was the exacerbation with supine sleep position.  She was hypoxemic for a very long time to at 61 minutes.  A CPAP titration was strongly recommended which she underwent on 27 November 2015 when her apnea index was reduced to 3.1.  At the time of the very first examination she was a shift worker working night shifts when I finally saw her the last time she had lost her job and while she had reported that initially CPAP worked well for her she was not longer able to afford supplies.  Now on 22 August 2018 she presents with heart flutter, palpitations, and she has reached end-stage renal disease, now in treatment on hemodialysis on [employer].  Her nephrologist is Dr.Deterding . She has now anemia. Her insomnia has improved but her daytime sleepiness has gotten worse.  Since she has reached end-stage renal disease she has no nocturia anymore tachycardia seems to be more prevalent on days of nondialysis.  Her family has noted that she snores loudly she reports morning headaches, suffering from gout, hypertension, morbid obesity and  diabetes.  She denies waking up with a dry mouth.  She does have a slight tremor in both hands but not a flapping tremor.  Sleep habits: dinnertime for this patient is between 630 and 7 PM but she is not in bed before midnight usually between midnight and 1 AM she states it is easier for her to fall asleep if she goes to bed later.  She states that she is restless at night she sleeps on her right side on one pillow on Monday, Wednesday, Friday she has hemodialysis appointments.  She rises at 6 AM having only 5 hours of sleep on those nights and then feels sleep deprived.  She works from 8 AM to 6 PM on her nondialysis days.   During hemodialysis she naps.    She no longer use CPAP since 2018 and her Epworth sleepiness score is now exacerbated to 16 out of 24 points.  Social history the patient is married, she is employed, she has a history of a C-section.  Denies any tobacco use never smoked, she does not drink, does not use caffeine.   Family history; her mother suffered from hypertension and diabetes as did her father her father also had stomach cancer both maternal grandparents had strokes.    Observations/Objective: Cassandra Campos has now a neck circumference of 18 inches a BMI of 39.5 at a weight of 230 pounds. She appears not short of breath and cannot complete sentences without being interrupted, there is no dysarthria or dysphonia.  Her eye movements seem to be regular her pupils of equal size she does not have a facial droop.  Tongue and uvula were not easy to appreciate her neck circumference was 18 inches but her uvula was not visible.  I would score this is a Mallampati grade 4.  She was able to provide shoulder shrug and she can rotate laterally tilt flex and hyperextend her neck.  Her hands have as slight tremor which can be related to the end-stage renal disease.  She also had a fistula placed.  There may be some left hand clumsiness and weakness with grip strength due to this.  I did not examine the patient's gait, her ability to stand on one leg turn neither her balance or coordination any further.    Assessment and Plan: An Epworth sleepiness score of 16 out of 24 points is very high and certainly interferes with functioning in social as well as professional capacities.  I would like for her to try to advance her bedtime 5 hours of sleep allocated are just not enough to function.  In addition I have no doubt that the patient has obstructive sleep apnea and that the sleep apnea may have gotten worse over the last years.  Her comorbidities of morbid obesity, diabetes, hypertension, gout and the observed  snoring and morning headaches all speak for it.  Since he is on hemodialysis her home sleep test should be performed at that time of a nondialysis day.  Her dry weight will be lower after dialysis treatment and will not reflect the worst possible apnea.  I would like for her to have the home sleep test therefore on a nondialysis day and we will then initiate further treatment.  I was not sure if the patient has atrial fibrillation but this is certainly another complicating factor while being on hemodialysis and while having apneas that are not super completely treated.   She always had tachycardia at baseline but she felt palpitations more frequently  in the last 6 months.   Follow Up Instructions:  08-2018 , virtual visit/   HST on a non dialysis day.  Patient has been non compliant with CPAP and dietary regimen in the past. I like for her to change her compliance and sleep habits in preparation.     Larey Seat, MD

## 2018-10-26 NOTE — Patient Instructions (Signed)
Also called: idiopathic facial paralysis [facility] Description Sudden weakness in the muscles on one half of the face. Bell's palsy may be a reaction to a viral infection. It rarely occurs more than once. Bell's palsy is characterized by muscle weakness that causes one half of the face to droop. Bell's palsy usually resolves on its own within six months. Physical therapy can help prevent muscles from permanently contracting. Rare Fewer than 200,000 [place] cases per year Treatable by a medical professional Requires a medical diagnosis Lab tests or imaging often required Short-term: resolves within days to weeks Critical: needs emergency care For informational purposes only. Consult your local medical authority for advice. Sources: [facility] and others. Learn more online.

## 2018-10-27 DIAGNOSIS — E162 Hypoglycemia, unspecified: Secondary | ICD-10-CM | POA: Diagnosis not present

## 2018-10-27 DIAGNOSIS — N186 End stage renal disease: Secondary | ICD-10-CM | POA: Diagnosis not present

## 2018-10-27 DIAGNOSIS — N2581 Secondary hyperparathyroidism of renal origin: Secondary | ICD-10-CM | POA: Diagnosis not present

## 2018-10-30 DIAGNOSIS — N2581 Secondary hyperparathyroidism of renal origin: Secondary | ICD-10-CM | POA: Diagnosis not present

## 2018-10-30 DIAGNOSIS — N186 End stage renal disease: Secondary | ICD-10-CM | POA: Diagnosis not present

## 2018-11-01 ENCOUNTER — Telehealth: Payer: Self-pay | Admitting: Neurology

## 2018-11-01 DIAGNOSIS — N186 End stage renal disease: Secondary | ICD-10-CM | POA: Diagnosis not present

## 2018-11-01 DIAGNOSIS — N2581 Secondary hyperparathyroidism of renal origin: Secondary | ICD-10-CM | POA: Diagnosis not present

## 2018-11-01 IMAGING — CR DG ANKLE COMPLETE 3+V*L*
3 series · 3 of 3 positions shown · non-contrast
Comparison: Plain films left foot 07/22/2009. MRI left ankle
10/17/2004.

CLINICAL DATA: Worsening left foot and ankle pain over the past 4
days. No known injury. Difficulty bearing weight. Initial encounter.

EXAM:
LEFT ANKLE COMPLETE - 3+ VIEW

[ankle ap]
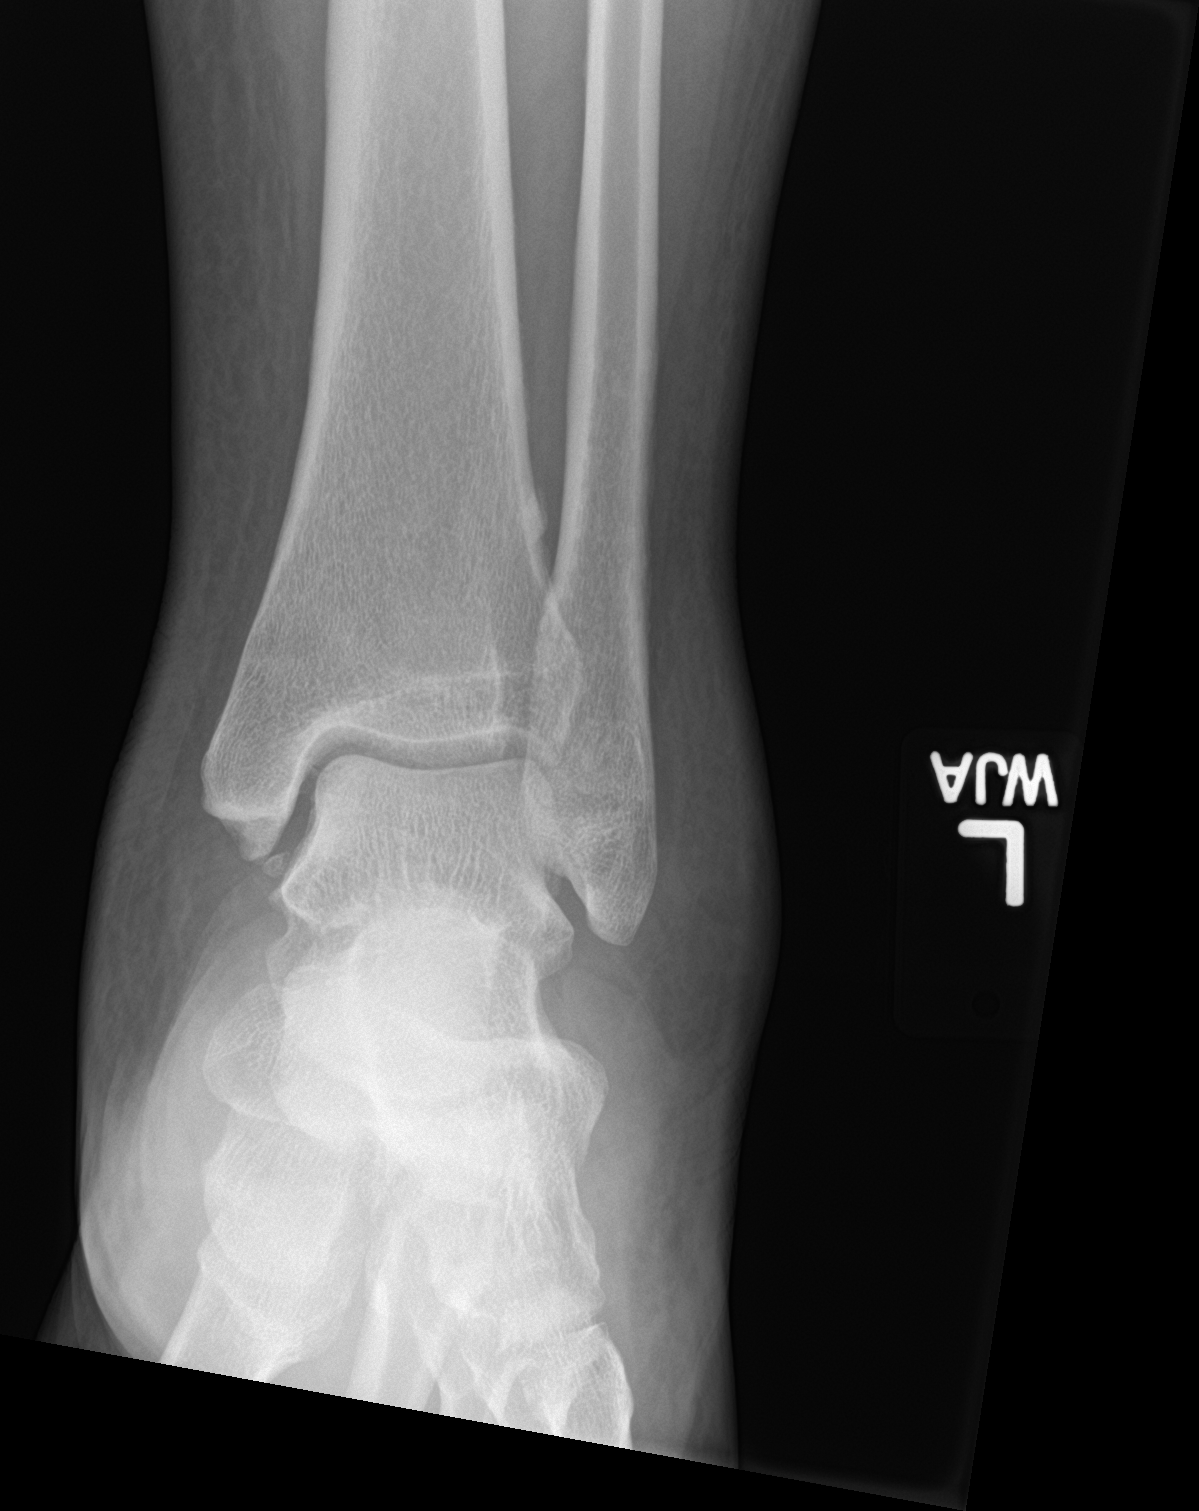

[ankle obl]
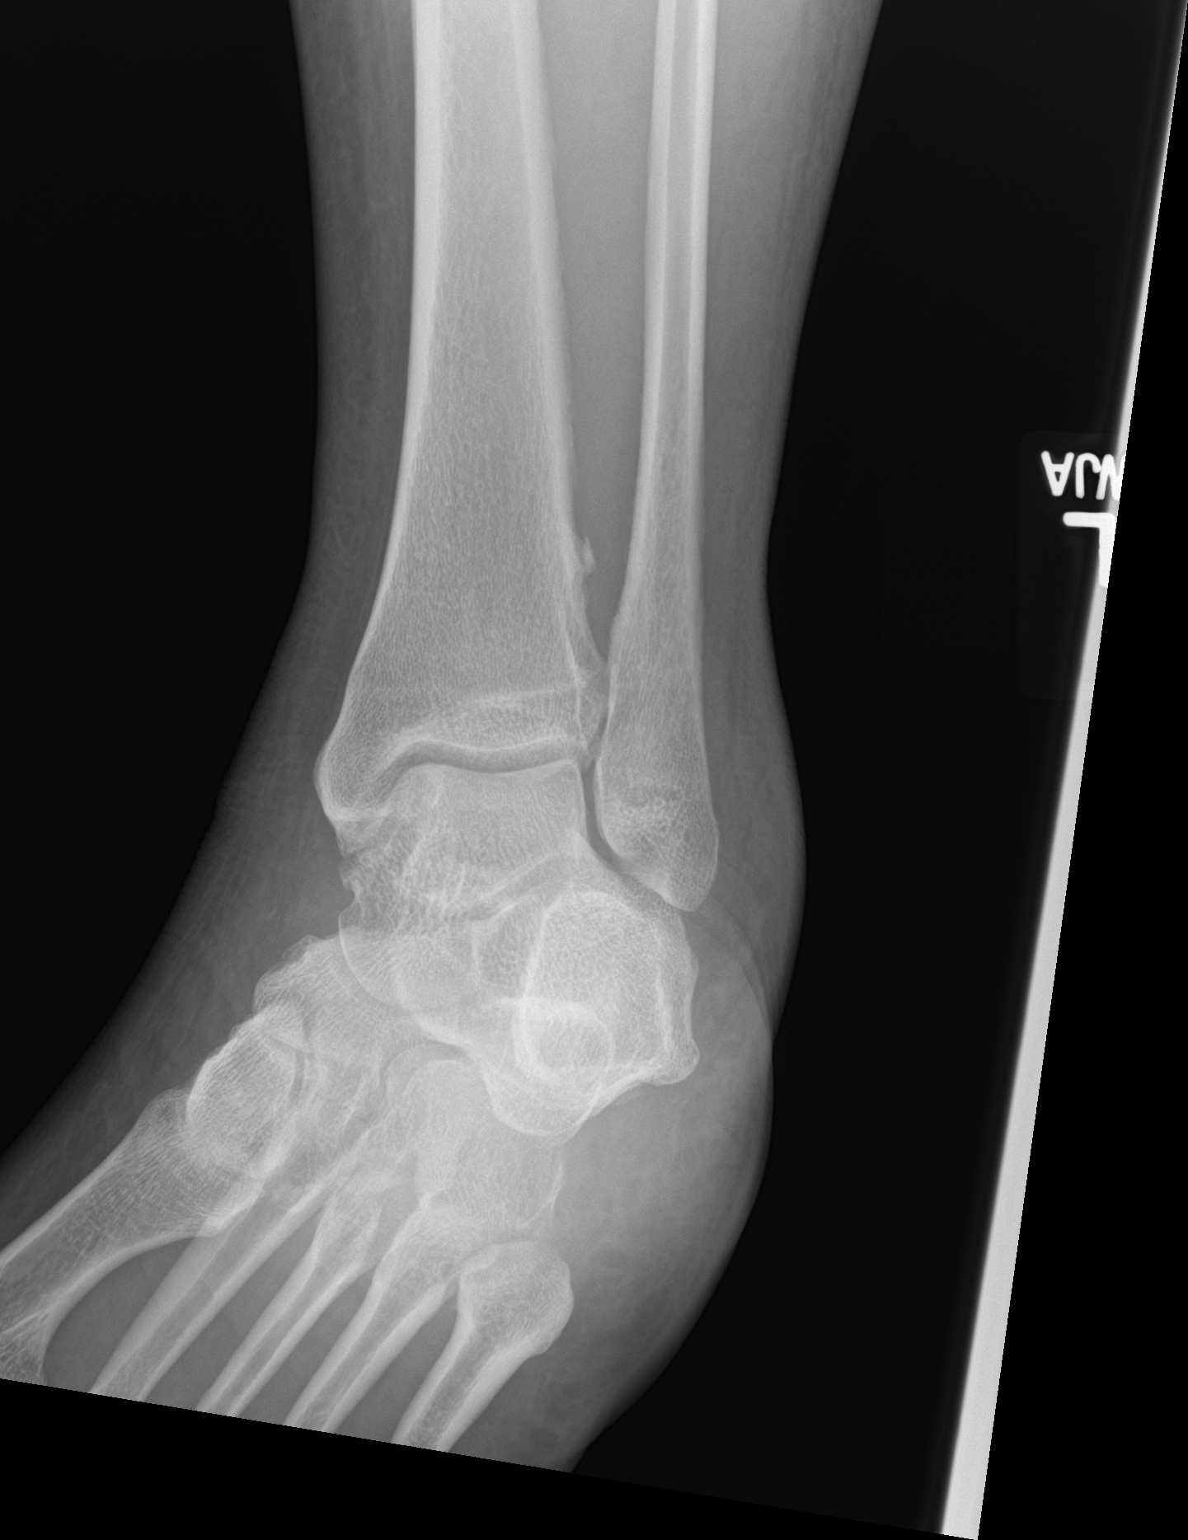

[ankle lat]
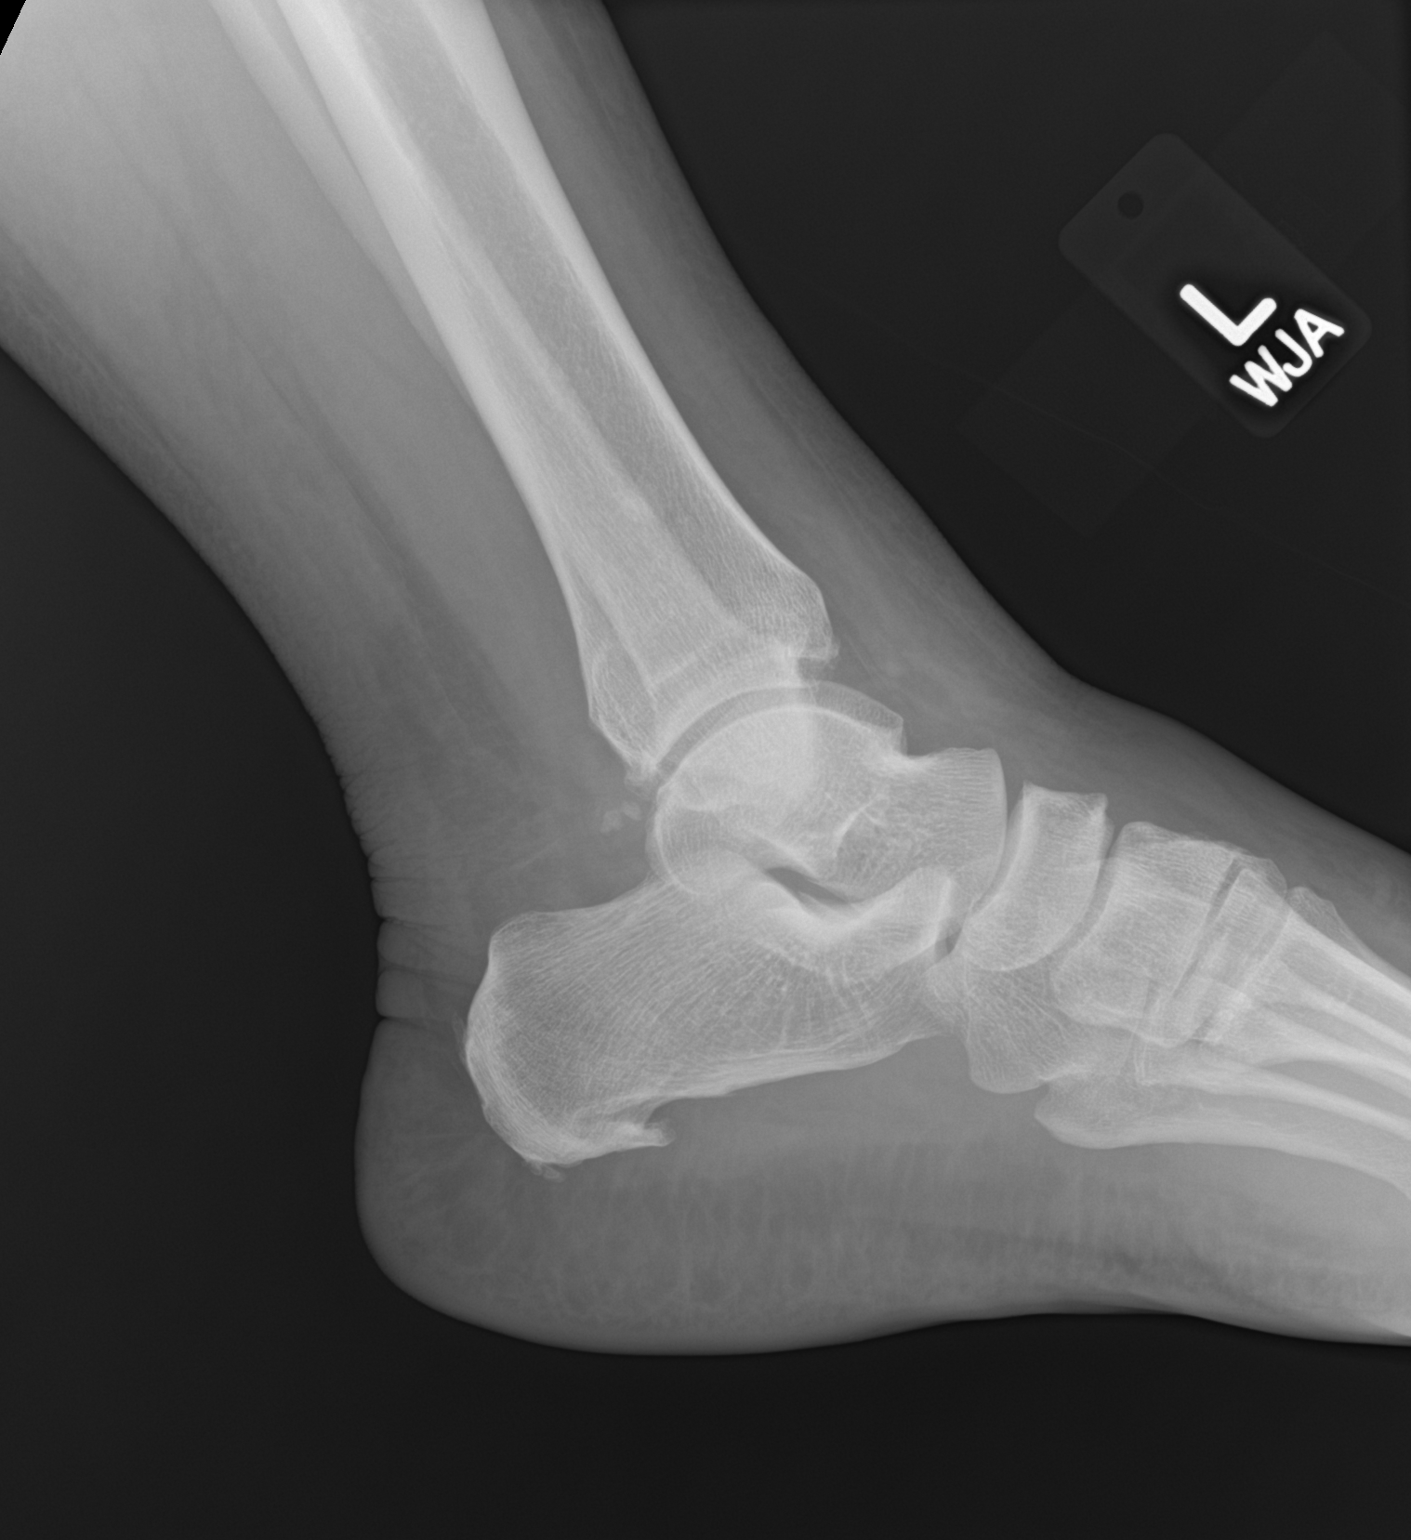

[3 of 3 positions shown; findings below may reference images not displayed]

FINDINGS: There is no acute bony or joint abnormality. Plantar calcaneal spur
is noted. 2 small calcifications measuring up to 0.3 cm in diameter
are seen posterior to the tibiotalar joint and may represent loose
bodies.
IMPRESSION: No acute abnormality or finding to explain patient's symptoms.

Plantar calcaneal spur.

2 small calcifications posterior to the tibiotalar joint may
represent loose bodies pre

## 2018-11-01 NOTE — Telephone Encounter (Signed)
[employer] Auth: [id number] (exp. 11/01/18 to 04/29/19)/medicaid order sent to [facility]. They will reach out to the patient to schedule.

## 2018-11-02 DIAGNOSIS — H5213 Myopia, bilateral: Secondary | ICD-10-CM | POA: Diagnosis not present

## 2018-11-02 DIAGNOSIS — H52223 Regular astigmatism, bilateral: Secondary | ICD-10-CM | POA: Diagnosis not present

## 2018-11-02 DIAGNOSIS — H524 Presbyopia: Secondary | ICD-10-CM | POA: Diagnosis not present

## 2018-11-02 DIAGNOSIS — H538 Other visual disturbances: Secondary | ICD-10-CM | POA: Diagnosis not present

## 2018-11-03 DIAGNOSIS — N186 End stage renal disease: Secondary | ICD-10-CM | POA: Diagnosis not present

## 2018-11-03 DIAGNOSIS — N2581 Secondary hyperparathyroidism of renal origin: Secondary | ICD-10-CM | POA: Diagnosis not present

## 2018-11-06 DIAGNOSIS — N186 End stage renal disease: Secondary | ICD-10-CM | POA: Diagnosis not present

## 2018-11-06 DIAGNOSIS — N2581 Secondary hyperparathyroidism of renal origin: Secondary | ICD-10-CM | POA: Diagnosis not present

## 2018-11-08 DIAGNOSIS — N186 End stage renal disease: Secondary | ICD-10-CM | POA: Diagnosis not present

## 2018-11-08 DIAGNOSIS — N2581 Secondary hyperparathyroidism of renal origin: Secondary | ICD-10-CM | POA: Diagnosis not present

## 2018-11-09 ENCOUNTER — Ambulatory Visit (INDEPENDENT_AMBULATORY_CARE_PROVIDER_SITE_OTHER): Payer: BC Managed Care – PPO | Admitting: Internal Medicine

## 2018-11-09 ENCOUNTER — Other Ambulatory Visit: Payer: Self-pay

## 2018-11-09 ENCOUNTER — Encounter: Payer: Self-pay | Admitting: Internal Medicine

## 2018-11-09 ENCOUNTER — Ambulatory Visit: Payer: BLUE CROSS/BLUE SHIELD | Admitting: Internal Medicine

## 2018-11-09 VITALS — BP 120/68 | HR 71 | Temp 98.5°F | Ht 62.8 in | Wt 227.6 lb

## 2018-11-09 DIAGNOSIS — E1165 Type 2 diabetes mellitus with hyperglycemia: Secondary | ICD-10-CM | POA: Diagnosis not present

## 2018-11-09 DIAGNOSIS — R5383 Other fatigue: Secondary | ICD-10-CM

## 2018-11-09 DIAGNOSIS — IMO0002 Reserved for concepts with insufficient information to code with codable children: Secondary | ICD-10-CM

## 2018-11-09 DIAGNOSIS — E1122 Type 2 diabetes mellitus with diabetic chronic kidney disease: Secondary | ICD-10-CM | POA: Diagnosis not present

## 2018-11-09 DIAGNOSIS — E1141 Type 2 diabetes mellitus with diabetic mononeuropathy: Secondary | ICD-10-CM | POA: Diagnosis not present

## 2018-11-09 DIAGNOSIS — I150 Renovascular hypertension: Secondary | ICD-10-CM

## 2018-11-09 DIAGNOSIS — N186 End stage renal disease: Secondary | ICD-10-CM | POA: Diagnosis not present

## 2018-11-09 DIAGNOSIS — M1A3790 Chronic gout due to renal impairment, unspecified ankle and foot, without tophus (tophi): Secondary | ICD-10-CM

## 2018-11-09 DIAGNOSIS — Z992 Dependence on renal dialysis: Secondary | ICD-10-CM

## 2018-11-09 LAB — POCT UA - MICROALBUMIN
Creatinine, POC: 200 mg/dL
Microalbumin Ur, POC: 150 mg/L

## 2018-11-10 DIAGNOSIS — N2581 Secondary hyperparathyroidism of renal origin: Secondary | ICD-10-CM | POA: Diagnosis not present

## 2018-11-10 DIAGNOSIS — N186 End stage renal disease: Secondary | ICD-10-CM | POA: Diagnosis not present

## 2018-11-10 LAB — CBC WITH DIFFERENTIAL/PLATELET
Basophils Absolute: 0.1 10*3/uL (ref 0.0–0.2)
Basos: 1 %
EOS (ABSOLUTE): 0.2 10*3/uL (ref 0.0–0.4)
Eos: 4 %
Hematocrit: 33 % — ABNORMAL LOW (ref 34.0–46.6)
Hemoglobin: 11.9 g/dL (ref 11.1–15.9)
Immature Grans (Abs): 0 10*3/uL (ref 0.0–0.1)
Immature Granulocytes: 0 %
Lymphocytes Absolute: 2.3 10*3/uL (ref 0.7–3.1)
Lymphs: 44 %
MCH: 33.3 pg — ABNORMAL HIGH (ref 26.6–33.0)
MCHC: 36.1 g/dL — ABNORMAL HIGH (ref 31.5–35.7)
MCV: 92 fL (ref 79–97)
Monocytes Absolute: 0.5 10*3/uL (ref 0.1–0.9)
Monocytes: 10 %
Neutrophils Absolute: 2.1 10*3/uL (ref 1.4–7.0)
Neutrophils: 41 %
Platelets: 217 10*3/uL (ref 150–450)
RBC: 3.57 x10E6/uL — ABNORMAL LOW (ref 3.77–5.28)
RDW: 13.5 % (ref 11.7–15.4)
WBC: 5.1 10*3/uL (ref 3.4–10.8)

## 2018-11-10 LAB — HEMOGLOBIN A1C
Est. average glucose Bld gHb Est-mCnc: 151 mg/dL
Hgb A1c MFr Bld: 6.9 % — ABNORMAL HIGH (ref 4.8–5.6)

## 2018-11-10 LAB — VITAMIN B12: Vitamin B-12: 899 pg/mL (ref 232–1245)

## 2018-11-10 LAB — TSH: TSH: 2.36 u[IU]/mL (ref 0.450–4.500)

## 2018-11-10 LAB — URIC ACID: Uric Acid: 3.2 mg/dL (ref 2.5–7.1)

## 2018-11-11 NOTE — Progress Notes (Signed)
Subjective:     Patient ID: Cassandra Campos , female    DOB: Apr 13, 1971 , 48 y.o.   MRN: [id number]   Chief Complaint  Patient presents with  . Diabetes  . Hypertension    HPI  Diabetes She presents for her follow-up diabetic visit. She has type 2 diabetes mellitus. Her disease course has been stable. There are no hypoglycemic associated symptoms. Associated symptoms include fatigue. Pertinent negatives for diabetes include no blurred vision and no chest pain. There are no hypoglycemic complications. Diabetic complications include nephropathy, peripheral neuropathy and retinopathy. Risk factors for coronary artery disease include dyslipidemia, hypertension, diabetes mellitus, post-menopausal, sedentary lifestyle and obesity. She is compliant with treatment some of the time. She is following a diabetic diet. She has not had a previous visit with a dietitian. She participates in exercise intermittently. Her home blood glucose trend is fluctuating minimally. Her breakfast blood glucose is taken between 9-10 am. Her breakfast blood glucose range is generally 140-180 mg/dl. Eye exam is current.  Hypertension This is a chronic problem. The current episode started more than 1 year ago. The problem has been gradually improving since onset. The problem is controlled. Pertinent negatives include no blurred vision, chest pain or shortness of breath. Palpitations: she c/o palpitations. recurrent. no associated chest pain. random. occurs at rest.  The current treatment provides moderate improvement. Hypertensive end-organ damage includes retinopathy. Identifiable causes of hypertension include renovascular disease.     Past Medical History:  Diagnosis Date  . Anemia associated with chronic renal failure   . ASCUS of cervix with negative high risk HPV 06/2017  . CKD (chronic kidney disease), stage IV ([place]) no dialysis yet   nephrologist-  dr Jamal Maes-- scheduled next visit 09-08-2017 (lov  07-05-2017)  . Diabetic retinopathy ([place])   . Dialysis patient ([place])   . Elevated transaminase level   . Fatty liver    FOLLOWED BY DR Henrene Pastor  . Hypertension   . Idiopathic gout of multiple sites    09-05-2017 per pt stable  . Insulin dependent type 2 diabetes mellitus ([place])    followed by dr Toy Care  . Mixed hyperlipidemia   . OSA (obstructive sleep apnea)    per study 10-20-2015 mild osa w/ AHI 10.3/hr;  09-05-2017 per pt has not used cpap since 1 yr ago  . Peripheral neuropathy    feet  . S/P arteriovenous (AV) fistula creation 07-04-2015  w/ revision 02-14-2017   left braniocephilic  . Trigger thumb, right thumb   . Umbilical hernia      Family History  Problem Relation Age of Onset  . Diabetes Mother   . Hypertension Mother   . Diabetes Father   . Hypertension Father   . Cancer Father        STOMACH  . Stomach cancer Father   . Stroke Maternal Grandmother   . Stroke Maternal Grandfather   . Colon cancer Neg Hx   . Rectal cancer Neg Hx      Current Outpatient Medications:  .  allopurinol (ZYLOPRIM) 300 MG tablet, Take 300 mg by mouth every morning. , Disp: , Rfl: 5 .  B Complex-C-Folic Acid (RENAL) 1 MG CAPS, TAKE 1 CAPSULE BY MOUTH DAILY (ON DIALYSIS DAYS, TAKE AFTER DIALYSIS TREATMENT ), Disp: , Rfl:  .  COLCRYS 0.6 MG tablet, TAKE ONE TABLET BY MOUTH ONCE DAILY (Patient taking differently: 3 times per week), Disp: 90 tablet, Rfl: 1 .  cromolyn (OPTICROM) 4 % ophthalmic solution, INSTILL 1  DROP INTO BOTH EYES FOUR TIMES A DAY, Disp: , Rfl: 2 .  EASY COMFORT PEN NEEDLES 31G X 8 MM MISC, INJECT TWICE A DAY AS DIRECTED, Disp: , Rfl: 3 .  glucose blood (CONTOUR NEXT TEST) test strip, Use as instructed to check blood sugars 4 times per day dx: e11.22, Disp: 300 each, Rfl: 2 .  HUMULIN R 500 UNIT/ML injection, 180 units in the morning and 140 units in the evening before meals (Patient taking differently: 170 units in the morning and 130 units in the evening before meals),  Disp: 20 mL, Rfl: 11 .  lanthanum (FOSRENOL) 1000 MG chewable tablet, Chew 1,000 mg by mouth 3 (three) times daily with meals., Disp: , Rfl:  .  lidocaine-prilocaine (EMLA) cream, USE ON ARAMS AS NEEDED FOR DIALYSIS, Disp: , Rfl: 10 .  OZEMPIC, 0.25 OR 0.5 MG/DOSE, 2 MG/1.5ML SOPN, INJECT 0.5 MG INTO THE SKIN ONCE A WEEK., Disp: 1 pen, Rfl: 2 .  pregabalin (LYRICA) 150 MG capsule, TAKE ONE CAPSULE BY MOUTH ONCE DAILY, Disp: 90 capsule, Rfl: 2 .  RESTASIS 0.05 % ophthalmic emulsion, INSTILL 1 DROP INTO BOTH EYES TWICE A DAY, Disp: , Rfl: 3 .  rosuvastatin (CRESTOR) 20 MG tablet, Take 1 tablet (20 mg total) by mouth daily., Disp: 90 tablet, Rfl: 3 .  valACYclovir (VALTREX) 500 MG tablet, One tablet every other day, Disp: 30 tablet, Rfl: 0 .  Chlorphen-PE-Acetaminophen (NOREL AD) 4-10-325 MG TABS, Take 4-10 mg by mouth 2 (two) times daily as needed. (Patient not taking: Reported on 11/09/2018), Disp: 20 tablet, Rfl: 0 .  Continuous Blood Gluc Receiver ([place]) DEVI, 1 each by Does not apply route 4 (four) times daily. Dx: e11.65 (Patient not taking: Reported on 11/09/2018), Disp: 1 Device, Rfl: 1 .  Continuous Blood Gluc Sensor (DEXCOM G6 SENSOR) MISC, 1 each by Does not apply route QID. (Patient not taking: Reported on 11/09/2018), Disp: 3 each, Rfl: 2 .  Continuous Blood Gluc Transmit (DEXCOM G6 TRANSMITTER) MISC, 1 each by Does not apply route QID. (Patient not taking: Reported on 11/09/2018), Disp: 2 each, Rfl: 2   No Known Allergies   Review of Systems  Constitutional: Positive for fatigue.       She reports having worsening fatigue. She is concerned that this is affecting her job performance. Admits to being under a great deal of stress at home. She enjoys work, but she has been feeling so "bad",she is concerned that she could lose her job  Eyes: Negative for blurred vision.  Respiratory: Negative.  Negative for shortness of breath.   Cardiovascular: Negative.  Negative for chest pain.  Palpitations: she c/o palpitations. recurrent. no associated chest pain. random. occurs at rest.   Gastrointestinal: Negative.   Neurological: Negative.   Psychiatric/Behavioral: Negative.      Today's Vitals   11/09/18 0843  BP: 120/68  Pulse: 71  Temp: 98.5 F (36.9 C)  TempSrc: Oral  Weight: 227 lb 9.6 oz (103.2 kg)  Height: 5' 2.8" (1.595 m)   Body mass index is 40.57 kg/m.   Objective:  Physical Exam Vitals signs and nursing note reviewed.  Constitutional:      Appearance: Normal appearance.  HENT:     Head: Normocephalic and atraumatic.  Cardiovascular:     Rate and Rhythm: Normal rate and regular rhythm.     Pulses:          Dorsalis pedis pulses are 2+ on the right side and 2+ on the  left side.     Heart sounds: Normal heart sounds.  Pulmonary:     Effort: Pulmonary effort is normal.     Breath sounds: Normal breath sounds.  Musculoskeletal:     Right lower leg: 1+ Pitting Edema present.     Left lower leg: 1+ Pitting Edema present.  Feet:     Right foot:     Protective Sensation: 5 sites tested. 5 sites sensed.     Skin integrity: Skin integrity normal.     Toenail Condition: Right toenails are normal.     Left foot:     Protective Sensation: 5 sites tested. 5 sites sensed.     Skin integrity: Skin integrity normal.     Toenail Condition: Left toenails are normal.  Skin:    General: Skin is warm.  Neurological:     General: No focal deficit present.     Mental Status: She is alert.  Psychiatric:        Mood and Affect: Mood normal.        Behavior: Behavior normal.         Assessment And Plan:     1. 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 Importance of medication AND exercise AND dietary compliance was discussed with the patient.   - Hemoglobin A1c - POCT UA - Microalbumin  2. Renovascular hypertension  Well controlled. She will continue with current meds. She is  encouraged to avoid adding salt to her foods.   3. Fatigue, unspecified type  Chronic. Possibly related to dialysis. I will check labs as listed below. Pt advised this could also be due to elevated blood sugars.   - Vitamin B12 - TSH - CBC with Diff  4. 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 She is encouraged to stay well hydrated.   - Uric acid  5. Diabetic mononeuropathy associated with type 2 diabetes mellitus ([facility])  Chronic, not yet controlled. She will discuss possibility of increasing Lyrica dose with her nephrologist, Dr Jimmy Footman. I will make necessary dose changes once she speaks to him.   Maximino Greenland, MD    THE PATIENT IS ENCOURAGED TO PRACTICE SOCIAL DISTANCING DUE TO THE COVID-19 PANDEMIC.

## 2018-11-11 NOTE — Patient Instructions (Signed)

## 2018-11-13 DIAGNOSIS — N186 End stage renal disease: Secondary | ICD-10-CM | POA: Diagnosis not present

## 2018-11-13 DIAGNOSIS — N2581 Secondary hyperparathyroidism of renal origin: Secondary | ICD-10-CM | POA: Diagnosis not present

## 2018-11-14 ENCOUNTER — Ambulatory Visit: Payer: BC Managed Care – PPO | Admitting: Internal Medicine

## 2018-11-14 ENCOUNTER — Ambulatory Visit: Payer: BLUE CROSS/BLUE SHIELD | Admitting: Neurology

## 2018-11-14 ENCOUNTER — Ambulatory Visit: Payer: Self-pay | Admitting: Neurology

## 2018-11-14 ENCOUNTER — Encounter

## 2018-11-14 DIAGNOSIS — Z992 Dependence on renal dialysis: Secondary | ICD-10-CM | POA: Diagnosis not present

## 2018-11-14 DIAGNOSIS — N186 End stage renal disease: Secondary | ICD-10-CM | POA: Diagnosis not present

## 2018-11-14 DIAGNOSIS — E1122 Type 2 diabetes mellitus with diabetic chronic kidney disease: Secondary | ICD-10-CM | POA: Diagnosis not present

## 2018-11-15 ENCOUNTER — Other Ambulatory Visit: Payer: Self-pay | Admitting: Internal Medicine

## 2018-11-15 DIAGNOSIS — N2581 Secondary hyperparathyroidism of renal origin: Secondary | ICD-10-CM | POA: Diagnosis not present

## 2018-11-15 DIAGNOSIS — N186 End stage renal disease: Secondary | ICD-10-CM | POA: Diagnosis not present

## 2018-11-17 DIAGNOSIS — N186 End stage renal disease: Secondary | ICD-10-CM | POA: Diagnosis not present

## 2018-11-17 DIAGNOSIS — N2581 Secondary hyperparathyroidism of renal origin: Secondary | ICD-10-CM | POA: Diagnosis not present

## 2018-11-20 DIAGNOSIS — N2581 Secondary hyperparathyroidism of renal origin: Secondary | ICD-10-CM | POA: Diagnosis not present

## 2018-11-20 DIAGNOSIS — N186 End stage renal disease: Secondary | ICD-10-CM | POA: Diagnosis not present

## 2018-11-21 DIAGNOSIS — E113512 Type 2 diabetes mellitus with proliferative diabetic retinopathy with macular edema, left eye: Secondary | ICD-10-CM | POA: Diagnosis not present

## 2018-11-22 DIAGNOSIS — N186 End stage renal disease: Secondary | ICD-10-CM | POA: Diagnosis not present

## 2018-11-22 DIAGNOSIS — N2581 Secondary hyperparathyroidism of renal origin: Secondary | ICD-10-CM | POA: Diagnosis not present

## 2018-11-23 DIAGNOSIS — H11431 Conjunctival hyperemia, right eye: Secondary | ICD-10-CM | POA: Diagnosis not present

## 2018-11-23 DIAGNOSIS — H10011 Acute follicular conjunctivitis, right eye: Secondary | ICD-10-CM | POA: Diagnosis not present

## 2018-11-24 DIAGNOSIS — N2581 Secondary hyperparathyroidism of renal origin: Secondary | ICD-10-CM | POA: Diagnosis not present

## 2018-11-24 DIAGNOSIS — N186 End stage renal disease: Secondary | ICD-10-CM | POA: Diagnosis not present

## 2018-11-27 DIAGNOSIS — N186 End stage renal disease: Secondary | ICD-10-CM | POA: Diagnosis not present

## 2018-11-27 DIAGNOSIS — N2581 Secondary hyperparathyroidism of renal origin: Secondary | ICD-10-CM | POA: Diagnosis not present

## 2018-11-28 ENCOUNTER — Other Ambulatory Visit: Payer: Self-pay

## 2018-11-28 ENCOUNTER — Ambulatory Visit
Admission: RE | Admit: 2018-11-28 | Discharge: 2018-11-28 | Disposition: A | Discharge status: Home / Self Care | Payer: BLUE CROSS/BLUE SHIELD | Source: Ambulatory Visit | Attending: Neurology | Admitting: Neurology

## 2018-11-28 DIAGNOSIS — I150 Renovascular hypertension: Secondary | ICD-10-CM | POA: Diagnosis not present

## 2018-11-28 DIAGNOSIS — G459 Transient cerebral ischemic attack, unspecified: Secondary | ICD-10-CM

## 2018-11-28 DIAGNOSIS — G4719 Other hypersomnia: Secondary | ICD-10-CM

## 2018-11-28 DIAGNOSIS — N184 Chronic kidney disease, stage 4 (severe): Secondary | ICD-10-CM

## 2018-11-28 DIAGNOSIS — G4700 Insomnia, unspecified: Secondary | ICD-10-CM

## 2018-11-29 DIAGNOSIS — N186 End stage renal disease: Secondary | ICD-10-CM | POA: Diagnosis not present

## 2018-11-29 DIAGNOSIS — N2581 Secondary hyperparathyroidism of renal origin: Secondary | ICD-10-CM | POA: Diagnosis not present

## 2018-11-30 ENCOUNTER — Encounter: Payer: Self-pay | Admitting: Neurology

## 2018-12-01 ENCOUNTER — Encounter: Payer: Self-pay | Admitting: Internal Medicine

## 2018-12-01 DIAGNOSIS — N186 End stage renal disease: Secondary | ICD-10-CM | POA: Diagnosis not present

## 2018-12-01 DIAGNOSIS — N2581 Secondary hyperparathyroidism of renal origin: Secondary | ICD-10-CM | POA: Diagnosis not present

## 2018-12-04 ENCOUNTER — Other Ambulatory Visit: Payer: Self-pay

## 2018-12-04 DIAGNOSIS — N186 End stage renal disease: Secondary | ICD-10-CM | POA: Diagnosis not present

## 2018-12-04 DIAGNOSIS — R51 Headache: Secondary | ICD-10-CM | POA: Diagnosis not present

## 2018-12-04 DIAGNOSIS — N2581 Secondary hyperparathyroidism of renal origin: Secondary | ICD-10-CM | POA: Diagnosis not present

## 2018-12-04 MED ORDER — MICROLET LANCETS MISC: 2 refills | Status: AC

## 2018-12-06 DIAGNOSIS — N186 End stage renal disease: Secondary | ICD-10-CM | POA: Diagnosis not present

## 2018-12-06 DIAGNOSIS — R51 Headache: Secondary | ICD-10-CM | POA: Diagnosis not present

## 2018-12-06 DIAGNOSIS — N2581 Secondary hyperparathyroidism of renal origin: Secondary | ICD-10-CM | POA: Diagnosis not present

## 2018-12-07 ENCOUNTER — Ambulatory Visit: Payer: Self-pay | Admitting: Adult Health

## 2018-12-07 DIAGNOSIS — M65352 Trigger finger, left little finger: Secondary | ICD-10-CM | POA: Diagnosis not present

## 2018-12-07 DIAGNOSIS — M65342 Trigger finger, left ring finger: Secondary | ICD-10-CM | POA: Diagnosis not present

## 2018-12-07 DIAGNOSIS — M654 Radial styloid tenosynovitis [de Quervain]: Secondary | ICD-10-CM | POA: Diagnosis not present

## 2018-12-08 ENCOUNTER — Telehealth: Payer: Self-pay

## 2018-12-08 ENCOUNTER — Encounter: Payer: Self-pay | Admitting: Internal Medicine

## 2018-12-08 DIAGNOSIS — N186 End stage renal disease: Secondary | ICD-10-CM | POA: Diagnosis not present

## 2018-12-08 DIAGNOSIS — N2581 Secondary hyperparathyroidism of renal origin: Secondary | ICD-10-CM | POA: Diagnosis not present

## 2018-12-08 DIAGNOSIS — R51 Headache: Secondary | ICD-10-CM | POA: Diagnosis not present

## 2018-12-08 NOTE — Telephone Encounter (Signed)
   Primary Cardiologist: Quay Burow, MD  Chart reviewed as part of pre-operative protocol coverage. Patient was contacted 12/08/2018 in reference to pre-operative risk assessment for pending surgery as outlined below.  [facility] was last seen on 07/04/2018 by Dr. Gwenlyn Found.  Since that day, [facility] has done well.  Therefore, based on ACC/AHA guidelines, the patient would be at acceptable risk for the planned procedure without further cardiovascular testing.   I will route this recommendation to the requesting party via Epic fax function and remove from pre-op pool.  Please call with questions.  Rosaria Ferries, PA-C 12/08/2018, 4:13 PM   [facility]   office phone number [phone]   office fax number [phone] ATTN: Ignatius Specking

## 2018-12-08 NOTE — Telephone Encounter (Signed)
   [facility] Pre-operative Risk Assessment    Request for surgical clearance:  1. What type of surgery is being performed? Left ring and little finger trigger release   2. When is this surgery scheduled? TBD   3. What type of clearance is required (medical clearance vs. Pharmacy clearance to hold med vs. Both)? Medical  4. Are there any medications that need to be held prior to surgery and how long?Not specified   5. Practice name and name of physician performing surgery? [facility]    6. What is your office phone number [phone]    7.   What is your office fax number [phone] ATTN: Ignatius Specking  8.   Anesthesia type (None, local, MAC, general) ? Not specified   Cassandra Campos 12/08/2018, 11:59 AM  _________________________________________________________________   (provider comments below)

## 2018-12-11 DIAGNOSIS — N186 End stage renal disease: Secondary | ICD-10-CM | POA: Diagnosis not present

## 2018-12-11 DIAGNOSIS — N2581 Secondary hyperparathyroidism of renal origin: Secondary | ICD-10-CM | POA: Diagnosis not present

## 2018-12-12 ENCOUNTER — Other Ambulatory Visit: Payer: Self-pay | Admitting: Internal Medicine

## 2018-12-12 MED ORDER — PREGABALIN 75 MG PO CAPS: ORAL_CAPSULE | ORAL | 1 refills | Status: DC

## 2018-12-13 DIAGNOSIS — N186 End stage renal disease: Secondary | ICD-10-CM | POA: Diagnosis not present

## 2018-12-13 DIAGNOSIS — Z01818 Encounter for other preprocedural examination: Secondary | ICD-10-CM | POA: Diagnosis not present

## 2018-12-13 DIAGNOSIS — Z7682 Awaiting organ transplant status: Secondary | ICD-10-CM | POA: Diagnosis not present

## 2018-12-13 DIAGNOSIS — N2581 Secondary hyperparathyroidism of renal origin: Secondary | ICD-10-CM | POA: Diagnosis not present

## 2018-12-15 DIAGNOSIS — N2581 Secondary hyperparathyroidism of renal origin: Secondary | ICD-10-CM | POA: Diagnosis not present

## 2018-12-15 DIAGNOSIS — Z992 Dependence on renal dialysis: Secondary | ICD-10-CM | POA: Diagnosis not present

## 2018-12-15 DIAGNOSIS — E1122 Type 2 diabetes mellitus with diabetic chronic kidney disease: Secondary | ICD-10-CM | POA: Diagnosis not present

## 2018-12-15 DIAGNOSIS — N186 End stage renal disease: Secondary | ICD-10-CM | POA: Diagnosis not present

## 2018-12-18 DIAGNOSIS — N186 End stage renal disease: Secondary | ICD-10-CM | POA: Diagnosis not present

## 2018-12-18 DIAGNOSIS — N2581 Secondary hyperparathyroidism of renal origin: Secondary | ICD-10-CM | POA: Diagnosis not present

## 2018-12-18 DIAGNOSIS — R51 Headache: Secondary | ICD-10-CM | POA: Diagnosis not present

## 2018-12-18 DIAGNOSIS — Z992 Dependence on renal dialysis: Secondary | ICD-10-CM | POA: Diagnosis not present

## 2018-12-19 DIAGNOSIS — E113511 Type 2 diabetes mellitus with proliferative diabetic retinopathy with macular edema, right eye: Secondary | ICD-10-CM | POA: Diagnosis not present

## 2018-12-20 DIAGNOSIS — Z992 Dependence on renal dialysis: Secondary | ICD-10-CM | POA: Diagnosis not present

## 2018-12-20 DIAGNOSIS — R51 Headache: Secondary | ICD-10-CM | POA: Diagnosis not present

## 2018-12-20 DIAGNOSIS — N2581 Secondary hyperparathyroidism of renal origin: Secondary | ICD-10-CM | POA: Diagnosis not present

## 2018-12-20 DIAGNOSIS — N186 End stage renal disease: Secondary | ICD-10-CM | POA: Diagnosis not present

## 2018-12-22 DIAGNOSIS — N2581 Secondary hyperparathyroidism of renal origin: Secondary | ICD-10-CM | POA: Diagnosis not present

## 2018-12-22 DIAGNOSIS — Z992 Dependence on renal dialysis: Secondary | ICD-10-CM | POA: Diagnosis not present

## 2018-12-22 DIAGNOSIS — N186 End stage renal disease: Secondary | ICD-10-CM | POA: Diagnosis not present

## 2018-12-22 DIAGNOSIS — R51 Headache: Secondary | ICD-10-CM | POA: Diagnosis not present

## 2018-12-25 DIAGNOSIS — Z992 Dependence on renal dialysis: Secondary | ICD-10-CM | POA: Diagnosis not present

## 2018-12-25 DIAGNOSIS — N186 End stage renal disease: Secondary | ICD-10-CM | POA: Diagnosis not present

## 2018-12-25 DIAGNOSIS — N2581 Secondary hyperparathyroidism of renal origin: Secondary | ICD-10-CM | POA: Diagnosis not present

## 2018-12-26 ENCOUNTER — Other Ambulatory Visit: Payer: Self-pay | Admitting: Internal Medicine

## 2018-12-27 DIAGNOSIS — N186 End stage renal disease: Secondary | ICD-10-CM | POA: Diagnosis not present

## 2018-12-27 DIAGNOSIS — Z992 Dependence on renal dialysis: Secondary | ICD-10-CM | POA: Diagnosis not present

## 2018-12-27 DIAGNOSIS — N2581 Secondary hyperparathyroidism of renal origin: Secondary | ICD-10-CM | POA: Diagnosis not present

## 2018-12-29 DIAGNOSIS — Z992 Dependence on renal dialysis: Secondary | ICD-10-CM | POA: Diagnosis not present

## 2018-12-29 DIAGNOSIS — N186 End stage renal disease: Secondary | ICD-10-CM | POA: Diagnosis not present

## 2018-12-29 DIAGNOSIS — N2581 Secondary hyperparathyroidism of renal origin: Secondary | ICD-10-CM | POA: Diagnosis not present

## 2019-01-01 DIAGNOSIS — N186 End stage renal disease: Secondary | ICD-10-CM | POA: Diagnosis not present

## 2019-01-01 DIAGNOSIS — N2581 Secondary hyperparathyroidism of renal origin: Secondary | ICD-10-CM | POA: Diagnosis not present

## 2019-01-01 DIAGNOSIS — Z992 Dependence on renal dialysis: Secondary | ICD-10-CM | POA: Diagnosis not present

## 2019-01-02 ENCOUNTER — Encounter: Payer: BC Managed Care – PPO | Admitting: Internal Medicine

## 2019-01-02 DIAGNOSIS — Z992 Dependence on renal dialysis: Secondary | ICD-10-CM | POA: Diagnosis not present

## 2019-01-02 DIAGNOSIS — N186 End stage renal disease: Secondary | ICD-10-CM | POA: Diagnosis not present

## 2019-01-02 DIAGNOSIS — N2581 Secondary hyperparathyroidism of renal origin: Secondary | ICD-10-CM | POA: Diagnosis not present

## 2019-01-03 ENCOUNTER — Other Ambulatory Visit: Payer: Self-pay | Admitting: Internal Medicine

## 2019-01-03 DIAGNOSIS — M654 Radial styloid tenosynovitis [de Quervain]: Secondary | ICD-10-CM | POA: Diagnosis not present

## 2019-01-03 DIAGNOSIS — M65352 Trigger finger, left little finger: Secondary | ICD-10-CM | POA: Diagnosis not present

## 2019-01-03 DIAGNOSIS — M65342 Trigger finger, left ring finger: Secondary | ICD-10-CM | POA: Diagnosis not present

## 2019-01-03 NOTE — Telephone Encounter (Signed)
Pregabalin 75 mg refill

## 2019-01-04 DIAGNOSIS — N2581 Secondary hyperparathyroidism of renal origin: Secondary | ICD-10-CM | POA: Diagnosis not present

## 2019-01-04 DIAGNOSIS — N186 End stage renal disease: Secondary | ICD-10-CM | POA: Diagnosis not present

## 2019-01-04 DIAGNOSIS — Z992 Dependence on renal dialysis: Secondary | ICD-10-CM | POA: Diagnosis not present

## 2019-01-05 DIAGNOSIS — N186 End stage renal disease: Secondary | ICD-10-CM | POA: Diagnosis not present

## 2019-01-05 DIAGNOSIS — Z992 Dependence on renal dialysis: Secondary | ICD-10-CM | POA: Diagnosis not present

## 2019-01-05 DIAGNOSIS — N2581 Secondary hyperparathyroidism of renal origin: Secondary | ICD-10-CM | POA: Diagnosis not present

## 2019-01-08 DIAGNOSIS — N186 End stage renal disease: Secondary | ICD-10-CM | POA: Diagnosis not present

## 2019-01-08 DIAGNOSIS — N2581 Secondary hyperparathyroidism of renal origin: Secondary | ICD-10-CM | POA: Diagnosis not present

## 2019-01-08 DIAGNOSIS — Z992 Dependence on renal dialysis: Secondary | ICD-10-CM | POA: Diagnosis not present

## 2019-01-10 DIAGNOSIS — Z992 Dependence on renal dialysis: Secondary | ICD-10-CM | POA: Diagnosis not present

## 2019-01-10 DIAGNOSIS — N186 End stage renal disease: Secondary | ICD-10-CM | POA: Diagnosis not present

## 2019-01-10 DIAGNOSIS — N2581 Secondary hyperparathyroidism of renal origin: Secondary | ICD-10-CM | POA: Diagnosis not present

## 2019-01-11 DIAGNOSIS — N186 End stage renal disease: Secondary | ICD-10-CM | POA: Diagnosis not present

## 2019-01-11 DIAGNOSIS — Z992 Dependence on renal dialysis: Secondary | ICD-10-CM | POA: Diagnosis not present

## 2019-01-11 DIAGNOSIS — N2581 Secondary hyperparathyroidism of renal origin: Secondary | ICD-10-CM | POA: Diagnosis not present

## 2019-01-12 DIAGNOSIS — N186 End stage renal disease: Secondary | ICD-10-CM | POA: Diagnosis not present

## 2019-01-12 DIAGNOSIS — Z992 Dependence on renal dialysis: Secondary | ICD-10-CM | POA: Diagnosis not present

## 2019-01-12 DIAGNOSIS — N2581 Secondary hyperparathyroidism of renal origin: Secondary | ICD-10-CM | POA: Diagnosis not present

## 2019-01-15 DIAGNOSIS — Z992 Dependence on renal dialysis: Secondary | ICD-10-CM | POA: Diagnosis not present

## 2019-01-15 DIAGNOSIS — N186 End stage renal disease: Secondary | ICD-10-CM | POA: Diagnosis not present

## 2019-01-15 DIAGNOSIS — N2581 Secondary hyperparathyroidism of renal origin: Secondary | ICD-10-CM | POA: Diagnosis not present

## 2019-01-16 DIAGNOSIS — N2581 Secondary hyperparathyroidism of renal origin: Secondary | ICD-10-CM | POA: Diagnosis not present

## 2019-01-16 DIAGNOSIS — Z992 Dependence on renal dialysis: Secondary | ICD-10-CM | POA: Diagnosis not present

## 2019-01-16 DIAGNOSIS — N186 End stage renal disease: Secondary | ICD-10-CM | POA: Diagnosis not present

## 2019-01-18 ENCOUNTER — Other Ambulatory Visit: Payer: Self-pay

## 2019-01-18 ENCOUNTER — Ambulatory Visit (INDEPENDENT_AMBULATORY_CARE_PROVIDER_SITE_OTHER): Payer: BC Managed Care – PPO | Admitting: Internal Medicine

## 2019-01-18 ENCOUNTER — Encounter: Payer: Self-pay | Admitting: Internal Medicine

## 2019-01-18 VITALS — BP 136/82 | HR 99 | Temp 98.2°F | Ht 62.8 in | Wt 224.2 lb

## 2019-01-18 DIAGNOSIS — E661 Drug-induced obesity: Secondary | ICD-10-CM | POA: Diagnosis not present

## 2019-01-18 DIAGNOSIS — Z6836 Body mass index (BMI) 36.0-36.9, adult: Secondary | ICD-10-CM

## 2019-01-18 DIAGNOSIS — E0841 Diabetes mellitus due to underlying condition with diabetic mononeuropathy: Secondary | ICD-10-CM | POA: Diagnosis not present

## 2019-01-18 DIAGNOSIS — N2581 Secondary hyperparathyroidism of renal origin: Secondary | ICD-10-CM | POA: Diagnosis not present

## 2019-01-18 DIAGNOSIS — N186 End stage renal disease: Secondary | ICD-10-CM | POA: Diagnosis not present

## 2019-01-18 DIAGNOSIS — Z992 Dependence on renal dialysis: Secondary | ICD-10-CM | POA: Diagnosis not present

## 2019-01-18 MED ORDER — PREGABALIN 100 MG PO CAPS: ORAL_CAPSULE | ORAL | 0 refills | Status: DC

## 2019-01-18 MED ORDER — PREGABALIN 75 MG PO CAPS: ORAL_CAPSULE | ORAL | 0 refills | Status: DC

## 2019-01-18 NOTE — Progress Notes (Addendum)
Subjective:     Patient ID: Cassandra Campos , female    DOB: June 23, 1970 , 48 y.o.   MRN: [id number]   Chief Complaint  Patient presents with  . Diabetes    f/u  . Medication Problem    wants to lower lyrica    HPI  1-DM FU, fasting from 66- 120. Post prandial 166-183, only once in the 200's when she had cake.  2- She wants to decrease the lyrica if she takes 200 due to getting jerks. She takes 150 mg in am and 75 mg in pm which has helped calm down her neuropathy, but is afraid the jerks are going to come back and was to do 100 in am and 75 mg in pm.   Past Medical History:  Diagnosis Date  . Anemia associated with chronic renal failure   . ASCUS of cervix with negative high risk HPV 06/2017  . CKD (chronic kidney disease), stage IV ([place]) no dialysis yet   nephrologist-  dr Jamal Maes-- scheduled next visit 09-08-2017 (lov 07-05-2017)  . Diabetic retinopathy ([place])   . Dialysis patient ([place])   . Elevated transaminase level   . Fatty liver    FOLLOWED BY DR Henrene Pastor  . Hypertension   . Idiopathic gout of multiple sites    09-05-2017 per pt stable  . Insulin dependent type 2 diabetes mellitus ([place])    followed by dr Toy Care  . Mixed hyperlipidemia   . OSA (obstructive sleep apnea)    per study 10-20-2015 mild osa w/ AHI 10.3/hr;  09-05-2017 per pt has not used cpap since 1 yr ago  . Peripheral neuropathy    feet  . S/P arteriovenous (AV) fistula creation 07-04-2015  w/ revision 02-14-2017   left braniocephilic  . Trigger thumb, right thumb   . Umbilical hernia      Family History  Problem Relation Age of Onset  . Diabetes Mother   . Hypertension Mother   . Diabetes Father   . Hypertension Father   . Cancer Father        STOMACH  . Stomach cancer Father   . Stroke Maternal Grandmother   . Stroke Maternal Grandfather   . Colon cancer Neg Hx   . Rectal cancer Neg Hx      Current Outpatient Medications:  .  allopurinol (ZYLOPRIM) 300 MG tablet, Take 300  mg by mouth every morning. , Disp: , Rfl: 5 .  B Complex-C-Folic Acid (RENAL) 1 MG CAPS, TAKE 1 CAPSULE BY MOUTH DAILY (ON DIALYSIS DAYS, TAKE AFTER DIALYSIS TREATMENT ), Disp: , Rfl:  .  Chlorphen-PE-Acetaminophen (NOREL AD) 4-10-325 MG TABS, Take 4-10 mg by mouth 2 (two) times daily as needed., Disp: 20 tablet, Rfl: 0 .  COLCRYS 0.6 MG tablet, TAKE ONE TABLET BY MOUTH ONCE DAILY (Patient taking differently: 3 times per week), Disp: 90 tablet, Rfl: 1 .  EASY COMFORT PEN NEEDLES 31G X 8 MM MISC, INJECT TWICE A DAY AS DIRECTED, Disp: , Rfl: 3 .  glucose blood (CONTOUR NEXT TEST) test strip, Use as instructed to check blood sugars 4 times per day dx: e11.22, Disp: 300 each, Rfl: 2 .  HUMULIN R 500 UNIT/ML injection, 180 units in the morning and 140 units in the evening before meals (Patient taking differently: 170 units in the morning and 130 units in the evening before meals), Disp: 20 mL, Rfl: 11 .  lanthanum (FOSRENOL) 1000 MG chewable tablet, Chew 1,000 mg by mouth 3 (three) times  daily with meals., Disp: , Rfl:  .  lidocaine-prilocaine (EMLA) cream, USE ON ARAMS AS NEEDED FOR DIALYSIS, Disp: , Rfl: 10 .  Microlet Lancets MISC, Use as directed to check blood sugars 4 times per day dx: e11.22, Disp: 300 each, Rfl: 2 .  OZEMPIC, 0.25 OR 0.5 MG/DOSE, 2 MG/1.5ML SOPN, INJECT 0.5 MG INTO THE SKIN ONCE A WEEK., Disp: 1.5 mL, Rfl: 3 .  pregabalin (LYRICA) 150 MG capsule, TAKE ONE CAPSULE BY MOUTH ONCE DAILY, Disp: 90 capsule, Rfl: 2 .  pregabalin (LYRICA) 75 MG capsule, Take one capsule po qam, Disp: 90 capsule, Rfl: 1 .  RESTASIS 0.05 % ophthalmic emulsion, INSTILL 1 DROP INTO BOTH EYES TWICE A DAY, Disp: , Rfl: 3 .  Continuous Blood Gluc Receiver ([place]) DEVI, 1 each by Does not apply route 4 (four) times daily. Dx: e11.65 (Patient not taking: Reported on 11/09/2018), Disp: 1 Device, Rfl: 1 .  Continuous Blood Gluc Sensor (DEXCOM G6 SENSOR) MISC, 1 each by Does not apply route QID. (Patient  not taking: Reported on 11/09/2018), Disp: 3 each, Rfl: 2 .  Continuous Blood Gluc Transmit (DEXCOM G6 TRANSMITTER) MISC, 1 each by Does not apply route QID. (Patient not taking: Reported on 11/09/2018), Disp: 2 each, Rfl: 2 .  rosuvastatin (CRESTOR) 20 MG tablet, Take 1 tablet (20 mg total) by mouth daily., Disp: 90 tablet, Rfl: 3 .  valACYclovir (VALTREX) 500 MG tablet, One tablet every other day (Patient not taking: Reported on 01/18/2019), Disp: 30 tablet, Rfl: 0   No Known Allergies   Review of Systems  L hand trigger finger wound is healing fine.  Review of Systems  Constitutional: Negative for diaphoresis and unexpected weight change.  HENT: Negative for tinnitus.   Eyes: Negative for visual disturbance.  Respiratory: Negative for chest tightness and shortness of breath.   Cardiovascular: Negative for chest pain, palpitations and leg swelling.  Gastrointestinal: Negative for constipation, diarrhea and nausea.  Endocrine: Negative for polydipsia, polyphagia and polyuria.  Genitourinary: Negative for dysuria and frequency.  Skin: Negative for rash and wound.  Neurological: Negative for dizziness, speech difficulty, weakness, numbness and headaches.  Today's Vitals   01/18/19 1651  BP: 136/82  Pulse: 99  Temp: 98.2 F (36.8 C)  TempSrc: Oral  Weight: 224 lb 3.2 oz (101.7 kg)  Height: 5' 2.8" (1.595 m)   Body mass index is 39.97 kg/m.   Objective:  Physical Exam   Constitutional: She is oriented to person, place, and time. She appears well-developed and well-nourished. No distress.  HENT:  Head: Normocephalic and atraumatic.  Right Ear: External ear normal.  Left Ear: External ear normal.  Nose: Nose normal.  Eyes: Conjunctivae are normal. Right eye exhibits no discharge. Left eye exhibits no discharge. No scleral icterus.  Neck: Neck supple. No thyromegaly present.  No carotid bruits bilaterally  Cardiovascular: Normal rate and regular rhythm.  No murmur  heard. Pulmonary/Chest: Effort normal and breath sounds normal. No respiratory distress.  Musculoskeletal: Normal range of motion. She exhibits no edema.  Lymphadenopathy:    She has no cervical adenopathy.  Neurological: She is alert and oriented to person, place, and time.  Skin: Skin is warm and dry. Capillary refill takes less than 2 seconds. No rash noted. She is not diaphoretic.  Psychiatric: She has a normal mood and affect. Her behavior is normal. Judgment and thought content normal.  Nursing note reviewed.    Assessment And Plan:  1-Diabetic mononeuropathy associated with diabetes mellitus  due to underlying condition ([place])- chronic. I change her dose of lyrica as she wanted. She is not due to have HGBA1C yet, since she just had it, and her renal MD also orders this.  2- Obesity class 2- encouraged to continue working on wt loss and to cut carbs to 1/4 cup per serving per meal.  Fu in 3 months with Dr Baird Cancer.      Gaetana Kawahara RODRIGUEZ-SOUTHWORTH, PA-C    THE PATIENT IS ENCOURAGED TO PRACTICE SOCIAL DISTANCING DUE TO THE COVID-19 PANDEMIC.

## 2019-01-19 DIAGNOSIS — N186 End stage renal disease: Secondary | ICD-10-CM | POA: Diagnosis not present

## 2019-01-19 DIAGNOSIS — N2581 Secondary hyperparathyroidism of renal origin: Secondary | ICD-10-CM | POA: Diagnosis not present

## 2019-01-19 DIAGNOSIS — Z992 Dependence on renal dialysis: Secondary | ICD-10-CM | POA: Diagnosis not present

## 2019-01-22 DIAGNOSIS — Z992 Dependence on renal dialysis: Secondary | ICD-10-CM | POA: Diagnosis not present

## 2019-01-22 DIAGNOSIS — N2581 Secondary hyperparathyroidism of renal origin: Secondary | ICD-10-CM | POA: Diagnosis not present

## 2019-01-22 DIAGNOSIS — N186 End stage renal disease: Secondary | ICD-10-CM | POA: Diagnosis not present

## 2019-01-23 DIAGNOSIS — Z992 Dependence on renal dialysis: Secondary | ICD-10-CM | POA: Diagnosis not present

## 2019-01-23 DIAGNOSIS — N186 End stage renal disease: Secondary | ICD-10-CM | POA: Diagnosis not present

## 2019-01-23 DIAGNOSIS — N2581 Secondary hyperparathyroidism of renal origin: Secondary | ICD-10-CM | POA: Diagnosis not present

## 2019-01-25 DIAGNOSIS — N2581 Secondary hyperparathyroidism of renal origin: Secondary | ICD-10-CM | POA: Diagnosis not present

## 2019-01-25 DIAGNOSIS — Z992 Dependence on renal dialysis: Secondary | ICD-10-CM | POA: Diagnosis not present

## 2019-01-25 DIAGNOSIS — N186 End stage renal disease: Secondary | ICD-10-CM | POA: Diagnosis not present

## 2019-01-26 DIAGNOSIS — Z992 Dependence on renal dialysis: Secondary | ICD-10-CM | POA: Diagnosis not present

## 2019-01-26 DIAGNOSIS — N186 End stage renal disease: Secondary | ICD-10-CM | POA: Diagnosis not present

## 2019-01-26 DIAGNOSIS — N2581 Secondary hyperparathyroidism of renal origin: Secondary | ICD-10-CM | POA: Diagnosis not present

## 2019-01-29 DIAGNOSIS — N2581 Secondary hyperparathyroidism of renal origin: Secondary | ICD-10-CM | POA: Diagnosis not present

## 2019-01-29 DIAGNOSIS — Z992 Dependence on renal dialysis: Secondary | ICD-10-CM | POA: Diagnosis not present

## 2019-01-29 DIAGNOSIS — Z23 Encounter for immunization: Secondary | ICD-10-CM | POA: Diagnosis not present

## 2019-01-29 DIAGNOSIS — N186 End stage renal disease: Secondary | ICD-10-CM | POA: Diagnosis not present

## 2019-01-30 DIAGNOSIS — N186 End stage renal disease: Secondary | ICD-10-CM | POA: Diagnosis not present

## 2019-01-30 DIAGNOSIS — Z992 Dependence on renal dialysis: Secondary | ICD-10-CM | POA: Diagnosis not present

## 2019-01-30 DIAGNOSIS — Z23 Encounter for immunization: Secondary | ICD-10-CM | POA: Diagnosis not present

## 2019-01-30 DIAGNOSIS — N2581 Secondary hyperparathyroidism of renal origin: Secondary | ICD-10-CM | POA: Diagnosis not present

## 2019-02-01 DIAGNOSIS — Z23 Encounter for immunization: Secondary | ICD-10-CM | POA: Diagnosis not present

## 2019-02-01 DIAGNOSIS — N2581 Secondary hyperparathyroidism of renal origin: Secondary | ICD-10-CM | POA: Diagnosis not present

## 2019-02-01 DIAGNOSIS — N186 End stage renal disease: Secondary | ICD-10-CM | POA: Diagnosis not present

## 2019-02-01 DIAGNOSIS — Z992 Dependence on renal dialysis: Secondary | ICD-10-CM | POA: Diagnosis not present

## 2019-02-02 DIAGNOSIS — N2581 Secondary hyperparathyroidism of renal origin: Secondary | ICD-10-CM | POA: Diagnosis not present

## 2019-02-02 DIAGNOSIS — Z23 Encounter for immunization: Secondary | ICD-10-CM | POA: Diagnosis not present

## 2019-02-02 DIAGNOSIS — Z992 Dependence on renal dialysis: Secondary | ICD-10-CM | POA: Diagnosis not present

## 2019-02-02 DIAGNOSIS — N186 End stage renal disease: Secondary | ICD-10-CM | POA: Diagnosis not present

## 2019-02-05 ENCOUNTER — Encounter: Payer: Self-pay | Admitting: Internal Medicine

## 2019-02-05 DIAGNOSIS — N2581 Secondary hyperparathyroidism of renal origin: Secondary | ICD-10-CM | POA: Diagnosis not present

## 2019-02-05 DIAGNOSIS — N186 End stage renal disease: Secondary | ICD-10-CM | POA: Diagnosis not present

## 2019-02-05 DIAGNOSIS — Z992 Dependence on renal dialysis: Secondary | ICD-10-CM | POA: Diagnosis not present

## 2019-02-06 ENCOUNTER — Encounter: Payer: Self-pay | Admitting: Gynecology

## 2019-02-06 DIAGNOSIS — N186 End stage renal disease: Secondary | ICD-10-CM | POA: Diagnosis not present

## 2019-02-06 DIAGNOSIS — Z992 Dependence on renal dialysis: Secondary | ICD-10-CM | POA: Diagnosis not present

## 2019-02-06 DIAGNOSIS — N2581 Secondary hyperparathyroidism of renal origin: Secondary | ICD-10-CM | POA: Diagnosis not present

## 2019-02-07 ENCOUNTER — Other Ambulatory Visit: Payer: Self-pay | Admitting: Internal Medicine

## 2019-02-07 MED ORDER — BENZONATATE 100 MG PO CAPS: 100.0000 mg | ORAL_CAPSULE | Freq: Three times a day (TID) | ORAL | 1 refills | Status: DC | PRN

## 2019-02-08 DIAGNOSIS — N186 End stage renal disease: Secondary | ICD-10-CM | POA: Diagnosis not present

## 2019-02-08 DIAGNOSIS — N2581 Secondary hyperparathyroidism of renal origin: Secondary | ICD-10-CM | POA: Diagnosis not present

## 2019-02-08 DIAGNOSIS — Z992 Dependence on renal dialysis: Secondary | ICD-10-CM | POA: Diagnosis not present

## 2019-02-09 DIAGNOSIS — N186 End stage renal disease: Secondary | ICD-10-CM | POA: Diagnosis not present

## 2019-02-09 DIAGNOSIS — N2581 Secondary hyperparathyroidism of renal origin: Secondary | ICD-10-CM | POA: Diagnosis not present

## 2019-02-09 DIAGNOSIS — Z992 Dependence on renal dialysis: Secondary | ICD-10-CM | POA: Diagnosis not present

## 2019-02-12 ENCOUNTER — Other Ambulatory Visit: Payer: Self-pay | Admitting: Internal Medicine

## 2019-02-12 DIAGNOSIS — N2581 Secondary hyperparathyroidism of renal origin: Secondary | ICD-10-CM | POA: Diagnosis not present

## 2019-02-12 DIAGNOSIS — N186 End stage renal disease: Secondary | ICD-10-CM | POA: Diagnosis not present

## 2019-02-12 DIAGNOSIS — Z992 Dependence on renal dialysis: Secondary | ICD-10-CM | POA: Diagnosis not present

## 2019-02-12 DIAGNOSIS — Z1231 Encounter for screening mammogram for malignant neoplasm of breast: Secondary | ICD-10-CM

## 2019-02-13 ENCOUNTER — Ambulatory Visit: Payer: BC Managed Care – PPO | Admitting: Internal Medicine

## 2019-02-13 DIAGNOSIS — Z992 Dependence on renal dialysis: Secondary | ICD-10-CM | POA: Diagnosis not present

## 2019-02-13 DIAGNOSIS — N186 End stage renal disease: Secondary | ICD-10-CM | POA: Diagnosis not present

## 2019-02-13 DIAGNOSIS — N2581 Secondary hyperparathyroidism of renal origin: Secondary | ICD-10-CM | POA: Diagnosis not present

## 2019-02-14 DIAGNOSIS — E1122 Type 2 diabetes mellitus with diabetic chronic kidney disease: Secondary | ICD-10-CM | POA: Diagnosis not present

## 2019-02-14 DIAGNOSIS — Z992 Dependence on renal dialysis: Secondary | ICD-10-CM | POA: Diagnosis not present

## 2019-02-14 DIAGNOSIS — N186 End stage renal disease: Secondary | ICD-10-CM | POA: Diagnosis not present

## 2019-02-15 DIAGNOSIS — N2581 Secondary hyperparathyroidism of renal origin: Secondary | ICD-10-CM | POA: Diagnosis not present

## 2019-02-15 DIAGNOSIS — Z992 Dependence on renal dialysis: Secondary | ICD-10-CM | POA: Diagnosis not present

## 2019-02-15 DIAGNOSIS — N186 End stage renal disease: Secondary | ICD-10-CM | POA: Diagnosis not present

## 2019-02-16 DIAGNOSIS — Z7682 Awaiting organ transplant status: Secondary | ICD-10-CM | POA: Diagnosis not present

## 2019-02-16 DIAGNOSIS — N186 End stage renal disease: Secondary | ICD-10-CM | POA: Diagnosis not present

## 2019-02-16 DIAGNOSIS — N2581 Secondary hyperparathyroidism of renal origin: Secondary | ICD-10-CM | POA: Diagnosis not present

## 2019-02-16 DIAGNOSIS — Z992 Dependence on renal dialysis: Secondary | ICD-10-CM | POA: Diagnosis not present

## 2019-02-19 DIAGNOSIS — N2581 Secondary hyperparathyroidism of renal origin: Secondary | ICD-10-CM | POA: Diagnosis not present

## 2019-02-19 DIAGNOSIS — Z992 Dependence on renal dialysis: Secondary | ICD-10-CM | POA: Diagnosis not present

## 2019-02-19 DIAGNOSIS — N186 End stage renal disease: Secondary | ICD-10-CM | POA: Diagnosis not present

## 2019-02-20 DIAGNOSIS — N186 End stage renal disease: Secondary | ICD-10-CM | POA: Diagnosis not present

## 2019-02-20 DIAGNOSIS — Z992 Dependence on renal dialysis: Secondary | ICD-10-CM | POA: Diagnosis not present

## 2019-02-20 DIAGNOSIS — N2581 Secondary hyperparathyroidism of renal origin: Secondary | ICD-10-CM | POA: Diagnosis not present

## 2019-02-21 ENCOUNTER — Telehealth: Payer: Self-pay

## 2019-02-21 ENCOUNTER — Telehealth (INDEPENDENT_AMBULATORY_CARE_PROVIDER_SITE_OTHER): Payer: BC Managed Care – PPO | Admitting: Cardiology

## 2019-02-21 ENCOUNTER — Encounter: Payer: Self-pay | Admitting: Cardiology

## 2019-02-21 DIAGNOSIS — Z992 Dependence on renal dialysis: Secondary | ICD-10-CM

## 2019-02-21 DIAGNOSIS — R002 Palpitations: Secondary | ICD-10-CM

## 2019-02-21 DIAGNOSIS — N186 End stage renal disease: Secondary | ICD-10-CM

## 2019-02-21 DIAGNOSIS — E113512 Type 2 diabetes mellitus with proliferative diabetic retinopathy with macular edema, left eye: Secondary | ICD-10-CM | POA: Diagnosis not present

## 2019-02-21 DIAGNOSIS — G4733 Obstructive sleep apnea (adult) (pediatric): Secondary | ICD-10-CM

## 2019-02-21 DIAGNOSIS — E119 Type 2 diabetes mellitus without complications: Secondary | ICD-10-CM

## 2019-02-21 NOTE — Assessment & Plan Note (Signed)
Pt describes intermittent "extra beats" - no sustained tachycardia.  Echo and Myoview essentially norma Her symptoms have been quiet lately. I suggested she avoid caffeine.  If her symptoms become bothersome we could give her an Rx for Metoprolol 12.5 mg BID PRN.

## 2019-02-21 NOTE — Assessment & Plan Note (Signed)
Unable to tolerate C-pap 

## 2019-02-21 NOTE — Progress Notes (Signed)
Virtual Visit via Telephone Note   This visit type was conducted due to national recommendations for restrictions regarding the COVID-19 Pandemic (e.g. social distancing) in an effort to limit this patient's exposure and mitigate transmission in our community.  Due to her co-morbid illnesses, this patient is at least at moderate risk for complications without adequate follow up.  This format is felt to be most appropriate for this patient at this time.  The patient did not have access to video technology/had technical difficulties with video requiring transitioning to audio format only (telephone).  All issues noted in this document were discussed and addressed.  No physical exam could be performed with this format.  Please refer to the patient's chart for her  consent to telehealth for [facility].   Date:  02/21/2019   ID:  Cassandra Campos, DOB 09/16/1970, MRN [id number]  Patient Location: Home Provider Location: Home  PCP:  Glendale Chard, MD  Cardiologist:  Quay Burow, MD  Electrophysiologist:  None   Evaluation Performed:  Follow-Up Visit  Chief Complaint:  occasional palpitations  History of Present Illness:    Cassandra Campos is a 48 y.o. female with insulin-dependent diabetes, dyslipidemia, essential hypertension, sleep apnea, and end-stage renal disease.  She is on home hemodialysis.  She is on the transplant list at [facility].  She had a low risk Myoview in January 2020 and echocardiogram March 2020 showed normal LV function with mild LVH.  The patient was contacted today for routine follow-up.  The patient had seen Dr. Debara Pickett for lipid evaluation back in May.  From a cardiac standpoint she is done well.  She has occasional palpitations.  She describes single beats as opposed to sustained tachycardia.  She was concerned about the reasoning for this.  I explained that structurally her heart function appears normal and that these are most likely of benign process.   I explained if she has sustained tachycardia that she should contact us we may adjust her medications based on that.  In the interim I suggested she avoid caffeine.  She does drink sodas.  She tells me that recently she has not had issues with palpitations.  In the future if she has problems we could prescribe her metoprolol 12.5 mg twice daily on a as needed basis.  The patient does not have symptoms concerning for COVID-19 infection (fever, chills, cough, or new shortness of breath).    Past Medical History:  Diagnosis Date   Anemia associated with chronic renal failure    ASCUS of cervix with negative high risk HPV 06/2017   CKD (chronic kidney disease), stage IV ([place]) no dialysis yet   nephrologist-  dr Jamal Maes-- scheduled next visit 09-08-2017 (lov 07-05-2017)   Diabetic retinopathy ([place])    Dialysis patient ([place])    Elevated transaminase level    Fatty liver    FOLLOWED BY DR Henrene Pastor   Hypertension    Idiopathic gout of multiple sites    09-05-2017 per pt stable   Insulin dependent type 2 diabetes mellitus ([place])    followed by dr Toy Care   Mixed hyperlipidemia    OSA (obstructive sleep apnea)    per study 10-20-2015 mild osa w/ AHI 10.3/hr;  09-05-2017 per pt has not used cpap since 1 yr ago   Peripheral neuropathy    feet   S/P arteriovenous (AV) fistula creation 07-04-2015  w/ revision 02-14-2017   left braniocephilic   Trigger thumb, right thumb    Umbilical hernia  Past Surgical History:  Procedure Laterality Date   ABDOMINAL HYSTERECTOMY  07/11/2000   dr gott   TAH/BSO for irregular bleeding and leiomyoma   AV FISTULA PLACEMENT Left 07/04/2015   Procedure: ARTERIOVENOUS (AV) FISTULA CREATION-LEFT;  Surgeon: Mal Misty, MD;  Location: [place];  Service: Vascular;  Laterality: Left;   BREAST EXCISIONAL BIOPSY Right    benign   CARPAL TUNNEL RELEASE Bilateral 1993 and [place]:  09-25-1999   BILATERAL TUBAL LIGATION W/  LAST C/S   DILATION AND CURETTAGE OF UTERUS     EXPLORATORY [place] / [place] ADHESIONS/  BILATERAL SALPINGOOPHORECTOMY  07-20-2011  dr s. Rhodia Albright  [facility]   FISTULA SUPERFICIALIZATION Left 08/28/2015   Procedure: BRACHIOCEPHALIC FISTULA SUPERFICIALIZATION;  Surgeon: Mal Misty, MD;  Location: [place];  Service: Vascular;  Laterality: Left;   PATCH ANGIOPLASTY Left 02/14/2017   Procedure: PATCH ANGIOPLASTY;  Surgeon: Elam Dutch, MD;  Location: [place];  Service: Vascular;  Laterality: Left;   PELVIC LAPAROSCOPY  1994   exploration for ectopic preg.   RETINAL DETACHMENT SURGERY Left 2015   REVISON OF ARTERIOVENOUS FISTULA Left 02/14/2017   Procedure: REVISON OF ARTERIOVENOUS FISTULA  LEFT ARM;  Surgeon: Elam Dutch, MD;  Location: [place];  Service: Vascular;  Laterality: Left;   TRIGGER FINGER RELEASE Left 2015   thumb   TRIGGER FINGER RELEASE Right 09/09/2017   Procedure: RELEASE TRIGGER FINGER/A-1 PULLEY RIGHT THUMB;  Surgeon: Dorna Leitz, MD;  Location: [place];  Service: Orthopedics;  Laterality: Right;     Current Meds  Medication Sig   allopurinol (ZYLOPRIM) 300 MG tablet Take 300 mg by mouth every morning.    B Complex-C-Folic Acid (RENAL) 1 MG CAPS TAKE 1 CAPSULE BY MOUTH DAILY (ON DIALYSIS DAYS, TAKE AFTER DIALYSIS TREATMENT )   benzonatate (TESSALON PERLES) 100 MG capsule Take 1 capsule (100 mg total) by mouth 3 (three) times daily as needed for cough.   Chlorphen-PE-Acetaminophen (NOREL AD) 4-10-325 MG TABS Take 4-10 mg by mouth 2 (two) times daily as needed.   cinacalcet (SENSIPAR) 60 MG tablet Take 60 mg by mouth daily.   COLCRYS 0.6 MG tablet TAKE ONE TABLET BY MOUTH ONCE DAILY (Patient taking differently: 3 times per week)   EASY COMFORT PEN NEEDLES 31G X 8 MM MISC INJECT TWICE A DAY AS DIRECTED   glucose blood (CONTOUR NEXT TEST) test strip Use as instructed to check blood sugars 4 times per day dx: e11.22   HUMULIN R 500 UNIT/ML  injection 180 units in the morning and 140 units in the evening before meals (Patient taking differently: 170 units in the morning and 130 units in the evening before meals)   lanthanum (FOSRENOL) 1000 MG chewable tablet Chew 1,000 mg by mouth 3 (three) times daily with meals.   lidocaine-prilocaine (EMLA) cream USE ON [place]AMS AS NEEDED FOR DIALYSIS   Microlet Lancets MISC Use as directed to check blood sugars 4 times per day dx: e11.22   OZEMPIC, 0.25 OR 0.5 MG/DOSE, 2 MG/1.5ML SOPN INJECT 0.5 MG INTO THE SKIN ONCE A WEEK.   pregabalin (LYRICA) 100 MG capsule One in am for neuropathy   pregabalin (LYRICA) 75 MG capsule Take one capsule po qam   RESTASIS 0.05 % ophthalmic emulsion INSTILL 1 DROP INTO BOTH EYES TWICE A DAY   rosuvastatin (CRESTOR) 20 MG tablet Take 1 tablet (20 mg total) by mouth daily.     Allergies:   Patient  has no known allergies.   Social History   Tobacco Use   Smoking status: Never Smoker   Smokeless tobacco: Never Used  Substance Use Topics   Alcohol use: Never    Alcohol/week: 0.0 standard drinks    Frequency: Never   Drug use: No     Family Hx: The patient's family history includes Cancer in her father; Diabetes in her father and mother; Hypertension in her father and mother; Stomach cancer in her father; Stroke in her maternal grandfather and maternal grandmother. There is no history of Colon cancer or Rectal cancer.  ROS:   Please see the history of present illness.    All other systems reviewed and are negative.   Prior CV studies:   The following studies were reviewed today: Echo march 2020 Myoview Jan 2020  Labs/Other Tests and Data Reviewed:    EKG:  No ECG reviewed.  Recent Labs: 09/20/2018: ALT 42; BUN 17; Creatinine, Ser 3.85; Potassium 2.9; Sodium 136 11/09/2018: Hemoglobin 11.9; Platelets 217; TSH 2.360   Recent Lipid Panel Lab Results  Component Value Date/Time   CHOL 188 07/11/2018 11:17 AM   TRIG 251 (H) 07/11/2018 11:17  AM   HDL 43 07/11/2018 11:17 AM   CHOLHDL 4.4 07/11/2018 11:17 AM   LDLCALC 95 07/11/2018 11:17 AM    Wt Readings from Last 3 Encounters:  02/21/19 226 lb (102.5 kg)  01/18/19 224 lb 3.2 oz (101.7 kg)  11/09/18 227 lb 9.6 oz (103.2 kg)     Objective:    Vital Signs:  BP (!) 167/91    Pulse (!) 103    Ht 5\' 3"  (1.6 m)    Wt 226 lb (102.5 kg)    BMI 40.03 kg/m    VITAL SIGNS:  reviewed  ASSESSMENT & PLAN:    Palpitations Pt describes intermittent "extra beats" - no sustained tachycardia.  Echo and Myoview essentially norma Her symptoms have been quiet lately. I suggested she avoid caffeine.  If her symptoms become bothersome we could give her an Rx for Metoprolol 12.5 mg BID PRN.  ESRD on hemodialysis ([place]) Pt is on home hemodialysis- She is on the transplant list at [facility].  Insulin dependent type 2 diabetes mellitus, controlled ([place]) Followed by Dr Dwyane Dee  Morbid obesity ([place]) BMI 40  OSA (obstructive sleep apnea) Unable to tolerate C-pap- Dr Dohmeier follows  COVID-19 Education: The signs and symptoms of COVID-19 were discussed with the patient and how to seek care for testing (follow up with PCP or arrange E-visit).  The importance of social distancing was discussed today.  Time:   Today, I have spent 20 minutes with the patient with telehealth technology discussing the above problems.     Medication Adjustments/Labs and Tests Ordered: Current medicines are reviewed at length with the patient today.  Concerns regarding medicines are outlined above.   Tests Ordered: No orders of the defined types were placed in this encounter.   Medication Changes: No orders of the defined types were placed in this encounter.   Follow Up:  In Person One year Dr Gwenlyn Found  Signed, Kerin Ransom, PA-C  02/21/2019 11:09 AM    [place]

## 2019-02-21 NOTE — Assessment & Plan Note (Signed)
Followed by Dr. Kumar.  ?

## 2019-02-21 NOTE — Telephone Encounter (Signed)
Contacted patient to discuss AVS Instructions. Gave patient Cassandra Campos's recommendations from today's virtual office visit. Informed patient that someone from the scheduling dept will be in contact with them to schedule their follow up appt. Patient voiced understanding and AVS mailed.

## 2019-02-21 NOTE — Assessment & Plan Note (Signed)
BMI 40 

## 2019-02-21 NOTE — Telephone Encounter (Signed)

## 2019-02-21 NOTE — Patient Instructions (Signed)

## 2019-02-21 NOTE — Assessment & Plan Note (Addendum)
Pt is on home hemodialysis- She is on the transplant list at [facility].

## 2019-02-22 DIAGNOSIS — Z992 Dependence on renal dialysis: Secondary | ICD-10-CM | POA: Diagnosis not present

## 2019-02-22 DIAGNOSIS — N2581 Secondary hyperparathyroidism of renal origin: Secondary | ICD-10-CM | POA: Diagnosis not present

## 2019-02-22 DIAGNOSIS — N186 End stage renal disease: Secondary | ICD-10-CM | POA: Diagnosis not present

## 2019-02-23 DIAGNOSIS — N2581 Secondary hyperparathyroidism of renal origin: Secondary | ICD-10-CM | POA: Diagnosis not present

## 2019-02-23 DIAGNOSIS — N186 End stage renal disease: Secondary | ICD-10-CM | POA: Diagnosis not present

## 2019-02-23 DIAGNOSIS — Z992 Dependence on renal dialysis: Secondary | ICD-10-CM | POA: Diagnosis not present

## 2019-02-26 DIAGNOSIS — N186 End stage renal disease: Secondary | ICD-10-CM | POA: Diagnosis not present

## 2019-02-26 DIAGNOSIS — N2581 Secondary hyperparathyroidism of renal origin: Secondary | ICD-10-CM | POA: Diagnosis not present

## 2019-02-26 DIAGNOSIS — Z992 Dependence on renal dialysis: Secondary | ICD-10-CM | POA: Diagnosis not present

## 2019-02-27 DIAGNOSIS — N2581 Secondary hyperparathyroidism of renal origin: Secondary | ICD-10-CM | POA: Diagnosis not present

## 2019-02-27 DIAGNOSIS — N186 End stage renal disease: Secondary | ICD-10-CM | POA: Diagnosis not present

## 2019-02-27 DIAGNOSIS — Z992 Dependence on renal dialysis: Secondary | ICD-10-CM | POA: Diagnosis not present

## 2019-02-27 DIAGNOSIS — E113511 Type 2 diabetes mellitus with proliferative diabetic retinopathy with macular edema, right eye: Secondary | ICD-10-CM | POA: Diagnosis not present

## 2019-03-01 ENCOUNTER — Other Ambulatory Visit: Payer: Self-pay | Admitting: Internal Medicine

## 2019-03-01 DIAGNOSIS — N2581 Secondary hyperparathyroidism of renal origin: Secondary | ICD-10-CM | POA: Diagnosis not present

## 2019-03-01 DIAGNOSIS — Z992 Dependence on renal dialysis: Secondary | ICD-10-CM | POA: Diagnosis not present

## 2019-03-01 DIAGNOSIS — N186 End stage renal disease: Secondary | ICD-10-CM | POA: Diagnosis not present

## 2019-03-02 DIAGNOSIS — N186 End stage renal disease: Secondary | ICD-10-CM | POA: Diagnosis not present

## 2019-03-02 DIAGNOSIS — N2581 Secondary hyperparathyroidism of renal origin: Secondary | ICD-10-CM | POA: Diagnosis not present

## 2019-03-02 DIAGNOSIS — Z992 Dependence on renal dialysis: Secondary | ICD-10-CM | POA: Diagnosis not present

## 2019-03-05 ENCOUNTER — Other Ambulatory Visit: Payer: Self-pay

## 2019-03-05 ENCOUNTER — Encounter: Payer: Self-pay | Admitting: Gynecology

## 2019-03-05 ENCOUNTER — Ambulatory Visit (INDEPENDENT_AMBULATORY_CARE_PROVIDER_SITE_OTHER): Payer: BC Managed Care – PPO | Admitting: Gynecology

## 2019-03-05 VITALS — BP 124/80

## 2019-03-05 DIAGNOSIS — N898 Other specified noninflammatory disorders of vagina: Secondary | ICD-10-CM | POA: Diagnosis not present

## 2019-03-05 DIAGNOSIS — B3731 Acute candidiasis of vulva and vagina: Secondary | ICD-10-CM

## 2019-03-05 DIAGNOSIS — B373 Candidiasis of vulva and vagina: Secondary | ICD-10-CM

## 2019-03-05 DIAGNOSIS — N2581 Secondary hyperparathyroidism of renal origin: Secondary | ICD-10-CM | POA: Diagnosis not present

## 2019-03-05 DIAGNOSIS — Z992 Dependence on renal dialysis: Secondary | ICD-10-CM | POA: Diagnosis not present

## 2019-03-05 DIAGNOSIS — N186 End stage renal disease: Secondary | ICD-10-CM | POA: Diagnosis not present

## 2019-03-05 LAB — WET PREP FOR TRICH, YEAST, CLUE

## 2019-03-05 MED ORDER — FLUCONAZOLE 150 MG PO TABS: 150.0000 mg | ORAL_TABLET | Freq: Once | ORAL | 0 refills | Status: AC

## 2019-03-05 NOTE — Progress Notes (Signed)
    [place] June 09, 1970 [id number]        48 y.o.  [id number] presents with 1 week history of vaginal irritation.  No significant discharge itching or odor.  No urinary symptoms such as frequency dysuria urgency.  Past medical history,surgical history, problem list, medications, allergies, family history and social history were all reviewed and documented in the EPIC chart.  Directed ROS with pertinent positives and negatives documented in the history of present illness/assessment and plan.  Exam: Caryn Bee assistant Vitals:   03/05/19 1435  BP: 124/80   General appearance:  Normal Abdomen soft nontender without masses guarding rebound Pelvic external BUS vagina with slight white discharge.  Bimanual without masses or tenderness.  Assessment/Plan:  48 y.o. [id number] with history and exam as above.  Wet prep consistent with yeast.  Will treat with Diflucan 150 mg x 1 dose.  Follow-up if symptoms persist, worsen or recur.    Anastasio Auerbach MD, 2:46 PM 03/05/2019

## 2019-03-05 NOTE — Patient Instructions (Signed)
Take the 1 Diflucan pill.  Call if your symptoms do not totally clear in several days.

## 2019-03-06 DIAGNOSIS — N2581 Secondary hyperparathyroidism of renal origin: Secondary | ICD-10-CM | POA: Diagnosis not present

## 2019-03-06 DIAGNOSIS — Z992 Dependence on renal dialysis: Secondary | ICD-10-CM | POA: Diagnosis not present

## 2019-03-06 DIAGNOSIS — N186 End stage renal disease: Secondary | ICD-10-CM | POA: Diagnosis not present

## 2019-03-08 DIAGNOSIS — N2581 Secondary hyperparathyroidism of renal origin: Secondary | ICD-10-CM | POA: Diagnosis not present

## 2019-03-08 DIAGNOSIS — Z992 Dependence on renal dialysis: Secondary | ICD-10-CM | POA: Diagnosis not present

## 2019-03-08 DIAGNOSIS — N186 End stage renal disease: Secondary | ICD-10-CM | POA: Diagnosis not present

## 2019-03-09 DIAGNOSIS — Z992 Dependence on renal dialysis: Secondary | ICD-10-CM | POA: Diagnosis not present

## 2019-03-09 DIAGNOSIS — N186 End stage renal disease: Secondary | ICD-10-CM | POA: Diagnosis not present

## 2019-03-09 DIAGNOSIS — N2581 Secondary hyperparathyroidism of renal origin: Secondary | ICD-10-CM | POA: Diagnosis not present

## 2019-03-12 DIAGNOSIS — N186 End stage renal disease: Secondary | ICD-10-CM | POA: Diagnosis not present

## 2019-03-12 DIAGNOSIS — N2581 Secondary hyperparathyroidism of renal origin: Secondary | ICD-10-CM | POA: Diagnosis not present

## 2019-03-12 DIAGNOSIS — Z992 Dependence on renal dialysis: Secondary | ICD-10-CM | POA: Diagnosis not present

## 2019-03-13 DIAGNOSIS — N186 End stage renal disease: Secondary | ICD-10-CM | POA: Diagnosis not present

## 2019-03-13 DIAGNOSIS — N2581 Secondary hyperparathyroidism of renal origin: Secondary | ICD-10-CM | POA: Diagnosis not present

## 2019-03-13 DIAGNOSIS — Z992 Dependence on renal dialysis: Secondary | ICD-10-CM | POA: Diagnosis not present

## 2019-03-15 DIAGNOSIS — N186 End stage renal disease: Secondary | ICD-10-CM | POA: Diagnosis not present

## 2019-03-15 DIAGNOSIS — N2581 Secondary hyperparathyroidism of renal origin: Secondary | ICD-10-CM | POA: Diagnosis not present

## 2019-03-15 DIAGNOSIS — Z992 Dependence on renal dialysis: Secondary | ICD-10-CM | POA: Diagnosis not present

## 2019-03-16 DIAGNOSIS — N186 End stage renal disease: Secondary | ICD-10-CM | POA: Diagnosis not present

## 2019-03-16 DIAGNOSIS — N2581 Secondary hyperparathyroidism of renal origin: Secondary | ICD-10-CM | POA: Diagnosis not present

## 2019-03-16 DIAGNOSIS — Z992 Dependence on renal dialysis: Secondary | ICD-10-CM | POA: Diagnosis not present

## 2019-03-17 DIAGNOSIS — E1122 Type 2 diabetes mellitus with diabetic chronic kidney disease: Secondary | ICD-10-CM | POA: Diagnosis not present

## 2019-03-17 DIAGNOSIS — N186 End stage renal disease: Secondary | ICD-10-CM | POA: Diagnosis not present

## 2019-03-17 DIAGNOSIS — Z992 Dependence on renal dialysis: Secondary | ICD-10-CM | POA: Diagnosis not present

## 2019-03-19 DIAGNOSIS — N186 End stage renal disease: Secondary | ICD-10-CM | POA: Diagnosis not present

## 2019-03-19 DIAGNOSIS — Z992 Dependence on renal dialysis: Secondary | ICD-10-CM | POA: Diagnosis not present

## 2019-03-19 DIAGNOSIS — N2581 Secondary hyperparathyroidism of renal origin: Secondary | ICD-10-CM | POA: Diagnosis not present

## 2019-03-20 DIAGNOSIS — Z992 Dependence on renal dialysis: Secondary | ICD-10-CM | POA: Diagnosis not present

## 2019-03-20 DIAGNOSIS — N186 End stage renal disease: Secondary | ICD-10-CM | POA: Diagnosis not present

## 2019-03-20 DIAGNOSIS — N2581 Secondary hyperparathyroidism of renal origin: Secondary | ICD-10-CM | POA: Diagnosis not present

## 2019-03-21 DIAGNOSIS — H43812 Vitreous degeneration, left eye: Secondary | ICD-10-CM | POA: Diagnosis not present

## 2019-03-21 DIAGNOSIS — H43391 Other vitreous opacities, right eye: Secondary | ICD-10-CM | POA: Diagnosis not present

## 2019-03-21 DIAGNOSIS — Z9889 Other specified postprocedural states: Secondary | ICD-10-CM | POA: Diagnosis not present

## 2019-03-21 DIAGNOSIS — H01134 Eczematous dermatitis of left upper eyelid: Secondary | ICD-10-CM | POA: Diagnosis not present

## 2019-03-21 DIAGNOSIS — H01131 Eczematous dermatitis of right upper eyelid: Secondary | ICD-10-CM | POA: Diagnosis not present

## 2019-03-21 DIAGNOSIS — H01135 Eczematous dermatitis of left lower eyelid: Secondary | ICD-10-CM | POA: Diagnosis not present

## 2019-03-21 DIAGNOSIS — H01132 Eczematous dermatitis of right lower eyelid: Secondary | ICD-10-CM | POA: Diagnosis not present

## 2019-03-21 DIAGNOSIS — E113393 Type 2 diabetes mellitus with moderate nonproliferative diabetic retinopathy without macular edema, bilateral: Secondary | ICD-10-CM | POA: Diagnosis not present

## 2019-03-21 DIAGNOSIS — H10413 Chronic giant papillary conjunctivitis, bilateral: Secondary | ICD-10-CM | POA: Diagnosis not present

## 2019-03-21 DIAGNOSIS — Z961 Presence of intraocular lens: Secondary | ICD-10-CM | POA: Diagnosis not present

## 2019-03-21 DIAGNOSIS — H04123 Dry eye syndrome of bilateral lacrimal glands: Secondary | ICD-10-CM | POA: Diagnosis not present

## 2019-03-21 LAB — HM DIABETES EYE EXAM

## 2019-03-22 ENCOUNTER — Encounter: Payer: Self-pay | Admitting: Internal Medicine

## 2019-03-22 DIAGNOSIS — Z992 Dependence on renal dialysis: Secondary | ICD-10-CM | POA: Diagnosis not present

## 2019-03-22 DIAGNOSIS — N186 End stage renal disease: Secondary | ICD-10-CM | POA: Diagnosis not present

## 2019-03-22 DIAGNOSIS — N2581 Secondary hyperparathyroidism of renal origin: Secondary | ICD-10-CM | POA: Diagnosis not present

## 2019-03-23 DIAGNOSIS — N186 End stage renal disease: Secondary | ICD-10-CM | POA: Diagnosis not present

## 2019-03-23 DIAGNOSIS — Z992 Dependence on renal dialysis: Secondary | ICD-10-CM | POA: Diagnosis not present

## 2019-03-23 DIAGNOSIS — N2581 Secondary hyperparathyroidism of renal origin: Secondary | ICD-10-CM | POA: Diagnosis not present

## 2019-03-26 DIAGNOSIS — Z992 Dependence on renal dialysis: Secondary | ICD-10-CM | POA: Diagnosis not present

## 2019-03-26 DIAGNOSIS — N186 End stage renal disease: Secondary | ICD-10-CM | POA: Diagnosis not present

## 2019-03-26 DIAGNOSIS — N2581 Secondary hyperparathyroidism of renal origin: Secondary | ICD-10-CM | POA: Diagnosis not present

## 2019-03-27 DIAGNOSIS — N186 End stage renal disease: Secondary | ICD-10-CM | POA: Diagnosis not present

## 2019-03-27 DIAGNOSIS — Z992 Dependence on renal dialysis: Secondary | ICD-10-CM | POA: Diagnosis not present

## 2019-03-27 DIAGNOSIS — N2581 Secondary hyperparathyroidism of renal origin: Secondary | ICD-10-CM | POA: Diagnosis not present

## 2019-03-29 ENCOUNTER — Ambulatory Visit
Admission: RE | Admit: 2019-03-29 | Discharge: 2019-03-29 | Disposition: A | Discharge status: Home / Self Care | Payer: Medicaid Other | Source: Ambulatory Visit | Attending: Internal Medicine | Admitting: Internal Medicine

## 2019-03-29 ENCOUNTER — Ambulatory Visit: Payer: Medicaid Other

## 2019-03-29 ENCOUNTER — Other Ambulatory Visit: Payer: Self-pay

## 2019-03-29 DIAGNOSIS — Z1231 Encounter for screening mammogram for malignant neoplasm of breast: Secondary | ICD-10-CM | POA: Diagnosis not present

## 2019-03-29 DIAGNOSIS — Z992 Dependence on renal dialysis: Secondary | ICD-10-CM | POA: Diagnosis not present

## 2019-03-29 DIAGNOSIS — N2581 Secondary hyperparathyroidism of renal origin: Secondary | ICD-10-CM | POA: Diagnosis not present

## 2019-03-29 DIAGNOSIS — N186 End stage renal disease: Secondary | ICD-10-CM | POA: Diagnosis not present

## 2019-03-30 DIAGNOSIS — N2581 Secondary hyperparathyroidism of renal origin: Secondary | ICD-10-CM | POA: Diagnosis not present

## 2019-03-30 DIAGNOSIS — N186 End stage renal disease: Secondary | ICD-10-CM | POA: Diagnosis not present

## 2019-03-30 DIAGNOSIS — Z992 Dependence on renal dialysis: Secondary | ICD-10-CM | POA: Diagnosis not present

## 2019-04-02 DIAGNOSIS — H31093 Other chorioretinal scars, bilateral: Secondary | ICD-10-CM | POA: Diagnosis not present

## 2019-04-02 DIAGNOSIS — H338 Other retinal detachments: Secondary | ICD-10-CM | POA: Diagnosis not present

## 2019-04-02 DIAGNOSIS — N2581 Secondary hyperparathyroidism of renal origin: Secondary | ICD-10-CM | POA: Diagnosis not present

## 2019-04-02 DIAGNOSIS — N186 End stage renal disease: Secondary | ICD-10-CM | POA: Diagnosis not present

## 2019-04-02 DIAGNOSIS — Z992 Dependence on renal dialysis: Secondary | ICD-10-CM | POA: Diagnosis not present

## 2019-04-02 DIAGNOSIS — H3582 Retinal ischemia: Secondary | ICD-10-CM | POA: Diagnosis not present

## 2019-04-02 DIAGNOSIS — E113511 Type 2 diabetes mellitus with proliferative diabetic retinopathy with macular edema, right eye: Secondary | ICD-10-CM | POA: Diagnosis not present

## 2019-04-03 DIAGNOSIS — N186 End stage renal disease: Secondary | ICD-10-CM | POA: Diagnosis not present

## 2019-04-03 DIAGNOSIS — Z992 Dependence on renal dialysis: Secondary | ICD-10-CM | POA: Diagnosis not present

## 2019-04-03 DIAGNOSIS — N2581 Secondary hyperparathyroidism of renal origin: Secondary | ICD-10-CM | POA: Diagnosis not present

## 2019-04-05 DIAGNOSIS — N2581 Secondary hyperparathyroidism of renal origin: Secondary | ICD-10-CM | POA: Diagnosis not present

## 2019-04-05 DIAGNOSIS — Z992 Dependence on renal dialysis: Secondary | ICD-10-CM | POA: Diagnosis not present

## 2019-04-05 DIAGNOSIS — N186 End stage renal disease: Secondary | ICD-10-CM | POA: Diagnosis not present

## 2019-04-08 DIAGNOSIS — Z992 Dependence on renal dialysis: Secondary | ICD-10-CM | POA: Diagnosis not present

## 2019-04-08 DIAGNOSIS — N2581 Secondary hyperparathyroidism of renal origin: Secondary | ICD-10-CM | POA: Diagnosis not present

## 2019-04-08 DIAGNOSIS — N186 End stage renal disease: Secondary | ICD-10-CM | POA: Diagnosis not present

## 2019-04-09 DIAGNOSIS — Z992 Dependence on renal dialysis: Secondary | ICD-10-CM | POA: Diagnosis not present

## 2019-04-09 DIAGNOSIS — N2581 Secondary hyperparathyroidism of renal origin: Secondary | ICD-10-CM | POA: Diagnosis not present

## 2019-04-09 DIAGNOSIS — N186 End stage renal disease: Secondary | ICD-10-CM | POA: Diagnosis not present

## 2019-04-09 DIAGNOSIS — E113512 Type 2 diabetes mellitus with proliferative diabetic retinopathy with macular edema, left eye: Secondary | ICD-10-CM | POA: Diagnosis not present

## 2019-04-10 DIAGNOSIS — Z992 Dependence on renal dialysis: Secondary | ICD-10-CM | POA: Diagnosis not present

## 2019-04-10 DIAGNOSIS — N2581 Secondary hyperparathyroidism of renal origin: Secondary | ICD-10-CM | POA: Diagnosis not present

## 2019-04-10 DIAGNOSIS — N186 End stage renal disease: Secondary | ICD-10-CM | POA: Diagnosis not present

## 2019-04-12 DIAGNOSIS — N2581 Secondary hyperparathyroidism of renal origin: Secondary | ICD-10-CM | POA: Diagnosis not present

## 2019-04-12 DIAGNOSIS — Z992 Dependence on renal dialysis: Secondary | ICD-10-CM | POA: Diagnosis not present

## 2019-04-12 DIAGNOSIS — N186 End stage renal disease: Secondary | ICD-10-CM | POA: Diagnosis not present

## 2019-04-16 ENCOUNTER — Telehealth: Payer: Self-pay | Admitting: *Deleted

## 2019-04-16 DIAGNOSIS — N186 End stage renal disease: Secondary | ICD-10-CM | POA: Diagnosis not present

## 2019-04-16 DIAGNOSIS — E1122 Type 2 diabetes mellitus with diabetic chronic kidney disease: Secondary | ICD-10-CM | POA: Diagnosis not present

## 2019-04-16 DIAGNOSIS — Z992 Dependence on renal dialysis: Secondary | ICD-10-CM | POA: Diagnosis not present

## 2019-04-16 DIAGNOSIS — N2581 Secondary hyperparathyroidism of renal origin: Secondary | ICD-10-CM | POA: Diagnosis not present

## 2019-04-16 MED ORDER — FLUCONAZOLE 150 MG PO TABS: 150.0000 mg | ORAL_TABLET | Freq: Once | ORAL | 0 refills | Status: AC

## 2019-04-16 NOTE — Telephone Encounter (Signed)
Okay for Diflucan 150 mg x 1 dose 

## 2019-04-16 NOTE — Telephone Encounter (Signed)
patient informed, Rx sent.

## 2019-04-16 NOTE — Telephone Encounter (Signed)
Patient was seen on 03/05/19 for vaginal irritation took diflucan 150 mg x 1 dose. Reports having same symptom again, no itching, no discharge, no urinary symptoms. Patient asked if refill could be provided? Please advise

## 2019-04-17 DIAGNOSIS — Z992 Dependence on renal dialysis: Secondary | ICD-10-CM | POA: Diagnosis not present

## 2019-04-17 DIAGNOSIS — N186 End stage renal disease: Secondary | ICD-10-CM | POA: Diagnosis not present

## 2019-04-17 DIAGNOSIS — N2581 Secondary hyperparathyroidism of renal origin: Secondary | ICD-10-CM | POA: Diagnosis not present

## 2019-04-19 ENCOUNTER — Encounter: Payer: Self-pay | Admitting: Internal Medicine

## 2019-04-19 ENCOUNTER — Other Ambulatory Visit: Payer: Self-pay

## 2019-04-19 ENCOUNTER — Telehealth: Payer: Self-pay | Admitting: Neurology

## 2019-04-19 ENCOUNTER — Ambulatory Visit (INDEPENDENT_AMBULATORY_CARE_PROVIDER_SITE_OTHER): Payer: BC Managed Care – PPO | Admitting: Internal Medicine

## 2019-04-19 VITALS — BP 126/72 | HR 102 | Temp 98.5°F | Ht 63.0 in | Wt 232.6 lb

## 2019-04-19 DIAGNOSIS — N2581 Secondary hyperparathyroidism of renal origin: Secondary | ICD-10-CM | POA: Diagnosis not present

## 2019-04-19 DIAGNOSIS — Z9114 Patient's other noncompliance with medication regimen: Secondary | ICD-10-CM

## 2019-04-19 DIAGNOSIS — I120 Hypertensive chronic kidney disease with stage 5 chronic kidney disease or end stage renal disease: Secondary | ICD-10-CM

## 2019-04-19 DIAGNOSIS — E0841 Diabetes mellitus due to underlying condition with diabetic mononeuropathy: Secondary | ICD-10-CM | POA: Diagnosis not present

## 2019-04-19 DIAGNOSIS — R000 Tachycardia, unspecified: Secondary | ICD-10-CM

## 2019-04-19 DIAGNOSIS — G4733 Obstructive sleep apnea (adult) (pediatric): Secondary | ICD-10-CM

## 2019-04-19 DIAGNOSIS — Z6841 Body Mass Index (BMI) 40.0 and over, adult: Secondary | ICD-10-CM

## 2019-04-19 DIAGNOSIS — N186 End stage renal disease: Secondary | ICD-10-CM

## 2019-04-19 DIAGNOSIS — Z23 Encounter for immunization: Secondary | ICD-10-CM

## 2019-04-19 DIAGNOSIS — Z992 Dependence on renal dialysis: Secondary | ICD-10-CM

## 2019-04-19 DIAGNOSIS — E119 Type 2 diabetes mellitus without complications: Secondary | ICD-10-CM

## 2019-04-19 DIAGNOSIS — R0789 Other chest pain: Secondary | ICD-10-CM

## 2019-04-19 NOTE — Patient Instructions (Signed)
Diabetes Mellitus and Exercise Exercising regularly is important for your overall health, especially when you have diabetes (diabetes mellitus). Exercising is not only about losing weight. It has many other health benefits, such as increasing muscle strength and bone density and reducing body fat and stress. This leads to improved fitness, flexibility, and endurance, all of which result in better overall health. Exercise has additional benefits for people with diabetes, including:  Reducing appetite.  Helping to lower and control blood glucose.  Lowering blood pressure.  Helping to control amounts of fatty substances (lipids) in the blood, such as cholesterol and triglycerides.  Helping the body to respond better to insulin (improving insulin sensitivity).  Reducing how much insulin the body needs.  Decreasing the risk for heart disease by: ? Lowering cholesterol and triglyceride levels. ? Increasing the levels of good cholesterol. ? Lowering blood glucose levels. What is my activity plan? Your health care provider or certified diabetes educator can help you make a plan for the type and frequency of exercise (activity plan) that works for you. Make sure that you:  Do at least 150 minutes of moderate-intensity or vigorous-intensity exercise each week. This could be brisk walking, biking, or water aerobics. ? Do stretching and strength exercises, such as yoga or weightlifting, at least 2 times a week. ? Spread out your activity over at least 3 days of the week.  Get some form of physical activity every day. ? Do not go more than 2 days in a row without some kind of physical activity. ? Avoid being inactive for more than 30 minutes at a time. Take frequent breaks to walk or stretch.  Choose a type of exercise or activity that you enjoy, and set realistic goals.  Start slowly, and gradually increase the intensity of your exercise over time. What do I need to know about managing my  diabetes?   Check your blood glucose before and after exercising. ? If your blood glucose is 240 mg/dL (13.3 mmol/L) or higher before you exercise, check your urine for ketones. If you have ketones in your urine, do not exercise until your blood glucose returns to normal. ? If your blood glucose is 100 mg/dL (5.6 mmol/L) or lower, eat a snack containing 15-20 grams of carbohydrate. Check your blood glucose 15 minutes after the snack to make sure that your level is above 100 mg/dL (5.6 mmol/L) before you start your exercise.  Know the symptoms of low blood glucose (hypoglycemia) and how to treat it. Your risk for hypoglycemia increases during and after exercise. Common symptoms of hypoglycemia can include: ? Hunger. ? Anxiety. ? Sweating and feeling clammy. ? Confusion. ? Dizziness or feeling light-headed. ? Increased heart rate or palpitations. ? Blurry vision. ? Tingling or numbness around the mouth, lips, or tongue. ? Tremors or shakes. ? Irritability.  Keep a rapid-acting carbohydrate snack available before, during, and after exercise to help prevent or treat hypoglycemia.  Avoid injecting insulin into areas of the body that are going to be exercised. For example, avoid injecting insulin into: ? The arms, when playing tennis. ? The legs, when jogging.  Keep records of your exercise habits. Doing this can help you and your health care provider adjust your diabetes management plan as needed. Write down: ? Food that you eat before and after you exercise. ? Blood glucose levels before and after you exercise. ? The type and amount of exercise you have done. ? When your insulin is expected to peak, if you use   insulin. Avoid exercising at times when your insulin is peaking.  When you start a new exercise or activity, work with your health care provider to make sure the activity is safe for you, and to adjust your insulin, medicines, or food intake as needed.  Drink plenty of water while  you exercise to prevent dehydration or heat stroke. Drink enough fluid to keep your urine clear or pale yellow. Summary  Exercising regularly is important for your overall health, especially when you have diabetes (diabetes mellitus).  Exercising has many health benefits, such as increasing muscle strength and bone density and reducing body fat and stress.  Your health care provider or certified diabetes educator can help you make a plan for the type and frequency of exercise (activity plan) that works for you.  When you start a new exercise or activity, work with your health care provider to make sure the activity is safe for you, and to adjust your insulin, medicines, or food intake as needed. This information is not intended to replace advice given to you by your health care provider. Make sure you discuss any questions you have with your health care provider. Document Released: 07/24/2003 Document Revised: 11/25/2016 Document Reviewed: 10/13/2015 Elsevier Patient Education  2020 [employer]  

## 2019-04-19 NOTE — Telephone Encounter (Signed)
CPAP auto titration device ordered. CD

## 2019-04-20 DIAGNOSIS — N2581 Secondary hyperparathyroidism of renal origin: Secondary | ICD-10-CM | POA: Diagnosis not present

## 2019-04-20 DIAGNOSIS — N186 End stage renal disease: Secondary | ICD-10-CM | POA: Diagnosis not present

## 2019-04-20 DIAGNOSIS — Z992 Dependence on renal dialysis: Secondary | ICD-10-CM | POA: Diagnosis not present

## 2019-04-20 LAB — HEMOGLOBIN A1C
Est. average glucose Bld gHb Est-mCnc: 266 mg/dL
Hgb A1c MFr Bld: 10.9 % — ABNORMAL HIGH (ref 4.8–5.6)

## 2019-04-23 DIAGNOSIS — N186 End stage renal disease: Secondary | ICD-10-CM | POA: Diagnosis not present

## 2019-04-23 DIAGNOSIS — Z992 Dependence on renal dialysis: Secondary | ICD-10-CM | POA: Diagnosis not present

## 2019-04-23 DIAGNOSIS — N2581 Secondary hyperparathyroidism of renal origin: Secondary | ICD-10-CM | POA: Diagnosis not present

## 2019-04-23 NOTE — Progress Notes (Signed)
This visit occurred during the SARS-CoV-2 public health emergency.  Safety protocols were in place, including screening questions prior to the visit, additional usage of staff PPE, and extensive cleaning of exam room while observing appropriate contact time as indicated for disinfecting solutions.  Subjective:     Patient ID: Cassandra Campos , female    DOB: 04-21-1971 , 48 y.o.   MRN: [id number]   Chief Complaint  Patient presents with  . Diabetes    neuropathy  . Hypertension    HPI  Diabetes She presents for her follow-up diabetic visit. She has type 2 diabetes mellitus. Her disease course has been stable. There are no hypoglycemic associated symptoms. Associated symptoms include fatigue. Pertinent negatives for diabetes include no blurred vision and no chest pain. There are no hypoglycemic complications. Diabetic complications include nephropathy, peripheral neuropathy and retinopathy. Risk factors for coronary artery disease include dyslipidemia, hypertension, diabetes mellitus, post-menopausal, sedentary lifestyle and obesity. She is compliant with treatment some of the time. She is following a diabetic diet. She has not had a previous visit with a dietitian. She participates in exercise intermittently. Her home blood glucose trend is fluctuating minimally. Her breakfast blood glucose is taken between 9-10 am. Her breakfast blood glucose range is generally 140-180 mg/dl. Eye exam is current.  Hypertension This is a chronic problem. The current episode started more than 1 year ago. The problem has been gradually improving since onset. The problem is controlled. Pertinent negatives include no blurred vision, chest pain, palpitations (she c/o palpitations. recurrent. no associated chest pain. random. occurs at rest. ) or shortness of breath. The current treatment provides moderate improvement. Hypertensive end-organ damage includes retinopathy. Identifiable causes of hypertension  include renovascular disease.     Past Medical History:  Diagnosis Date  . Anemia associated with chronic renal failure   . ASCUS of cervix with negative high risk HPV 06/2017  . CKD (chronic kidney disease), stage IV ([place]) no dialysis yet   nephrologist-  dr Jamal Maes-- scheduled next visit 09-08-2017 (lov 07-05-2017)  . Diabetic retinopathy ([place])   . Dialysis patient ([place])   . Elevated transaminase level   . Fatty liver    FOLLOWED BY DR Henrene Pastor  . Hypertension   . Idiopathic gout of multiple sites    09-05-2017 per pt stable  . Insulin dependent type 2 diabetes mellitus ([name])    followed by dr Toy Care  . Mixed hyperlipidemia   . OSA (obstructive sleep apnea)    per study 10-20-2015 mild osa w/ AHI 10.3/hr;  09-05-2017 per pt has not used cpap since 1 yr ago  . Peripheral neuropathy    feet  . S/P arteriovenous (AV) fistula creation 07-04-2015  w/ revision 02-14-2017   left braniocephilic  . Trigger thumb, right thumb   . Umbilical hernia      Family History  Problem Relation Age of Onset  . Diabetes Mother   . Hypertension Mother   . Diabetes Father   . Hypertension Father   . Cancer Father        STOMACH  . Stomach cancer Father   . Stroke Maternal Grandmother   . Stroke Maternal Grandfather   . Colon cancer Neg Hx   . Rectal cancer Neg Hx      Current Outpatient Medications:  .  allopurinol (ZYLOPRIM) 300 MG tablet, Take 300 mg by mouth every morning. , Disp: , Rfl: 5 .  benzonatate (TESSALON PERLES) 100 MG capsule, Take 1 capsule (100 mg total)  by mouth 3 (three) times daily as needed for cough., Disp: 30 capsule, Rfl: 1 .  calcitRIOL (ROCALTROL) 0.5 MCG capsule, Take 0.5 mcg by mouth daily., Disp: , Rfl:  .  Chlorphen-PE-Acetaminophen (NOREL AD) 4-10-325 MG TABS, Take 4-10 mg by mouth 2 (two) times daily as needed., Disp: 20 tablet, Rfl: 0 .  cinacalcet (SENSIPAR) 60 MG tablet, Take 60 mg by mouth daily., Disp: , Rfl:  .  EASY COMFORT PEN NEEDLES 31G X 8 MM  MISC, INJECT TWICE A DAY AS DIRECTED, Disp: , Rfl: 3 .  glucose blood (CONTOUR NEXT TEST) test strip, Use as instructed to check blood sugars 4 times per day dx: e11.22, Disp: 300 each, Rfl: 2 .  HUMULIN R 500 UNIT/ML injection, 180 units in the morning and 140 units in the evening before meals (Patient taking differently: 170 units in the morning and 130 units in the evening before meals), Disp: 20 mL, Rfl: 11 .  lanthanum (FOSRENOL) 1000 MG chewable tablet, Chew 1,000 mg by mouth 3 (three) times daily with meals., Disp: , Rfl:  .  lidocaine-prilocaine (EMLA) cream, USE ON ARAMS AS NEEDED FOR DIALYSIS, Disp: , Rfl: 10 .  Microlet Lancets MISC, Use as directed to check blood sugars 4 times per day dx: e11.22, Disp: 300 each, Rfl: 2 .  OZEMPIC, 0.25 OR 0.5 MG/DOSE, 2 MG/1.5ML SOPN, INJECT 0.5 MG INTO THE SKIN ONCE A WEEK., Disp: 1.5 mL, Rfl: 3 .  pregabalin (LYRICA) 100 MG capsule, One in am for neuropathy, Disp: 90 capsule, Rfl: 0 .  pregabalin (LYRICA) 75 MG capsule, Take one capsule po qam, Disp: 90 capsule, Rfl: 0 .  RESTASIS 0.05 % ophthalmic emulsion, INSTILL 1 DROP INTO BOTH EYES TWICE A DAY, Disp: , Rfl: 3 .  B Complex-C-Folic Acid (RENAL) 1 MG CAPS, TAKE 1 CAPSULE BY MOUTH DAILY (ON DIALYSIS DAYS, TAKE AFTER DIALYSIS TREATMENT ), Disp: , Rfl:  .  MITIGARE 0.6 MG CAPS, TAKE ONE CAPSULE BY MOUTH ONCE DAILY, Disp: 90 capsule, Rfl: 0 .  rosuvastatin (CRESTOR) 20 MG tablet, Take 1 tablet (20 mg total) by mouth daily., Disp: 90 tablet, Rfl: 3   No Known Allergies  Constitutional: Denies fever/chills/unexplained weight loss/night sweats.  Cardiac: she denies chest pain. She does have LE edema.  Resp: Denies SOB, cough.  GI: Denies n/v/d Neuro: pos neuropathy. Denies LE weakness      Today's Vitals   04/19/19 1618  BP: 126/72  Pulse: (!) 102  Temp: 98.5 F (36.9 C)  TempSrc: Oral  Weight: 232 lb 9.6 oz (105.5 kg)  Height: 5\' 3"  (1.6 m)   Body mass index is 41.2 kg/m.   Objective:   Physical Exam Vitals signs and nursing note reviewed.  Constitutional:      Appearance: Normal appearance. She is obese.  HENT:     Head: Normocephalic and atraumatic.  Cardiovascular:     Rate and Rhythm: Normal rate and regular rhythm.     Heart sounds: Normal heart sounds.  Pulmonary:     Effort: Pulmonary effort is normal.     Breath sounds: Normal breath sounds.  Skin:    General: Skin is warm.  Neurological:     General: No focal deficit present.     Mental Status: She is alert.  Psychiatric:        Mood and Affect: Mood normal.        Behavior: Behavior normal.         Assessment And Plan:  1. Diabetic mononeuropathy associated with diabetes mellitus due to underlying condition ([place])  I will check labs as listed below. Importance of regular exercise was discussed with the patient.   - Hemoglobin A1c  2. 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 She will continue with current meds.   3. Tachycardia  She is encouraged to take magnesium supplementation. She will confirm with Renal that she can do this safely.   4. Class 3 severe obesity due to excess calories with serious comorbidity and body mass index (BMI) of 40.0 to 44.9 in adult [facility])  Importance of achieving optimal weight to decrease risk of cardiovascular disease and cancers was discussed with the patient in full detail. She is encouraged to start slowly - start with 10 minutes twice daily at least three to four days per week and to gradually build to 30 minutes five days weekly. She was given tips to incorporate more activity into her daily routine - take stairs when possible, park farther away from her job, grocery stores, etc.    5. Need for vaccination  - Tdap vaccine greater than or equal to 7yo IM        Maximino Greenland, MD    THE PATIENT IS ENCOURAGED TO PRACTICE SOCIAL DISTANCING DUE TO THE COVID-19  PANDEMIC.

## 2019-04-23 NOTE — Telephone Encounter (Signed)
Order had previously been sent to [facility] to get the patient restarted with the machine in may 2020.  This is response from [place] in that they have attempted to reach out to the pt multiple times and she has not returned call to [facility] so they could discuss options.   "Spoke w/ Jeneen Rinks - pt did meet compliance in the first 30 days, but after that she stopped using the machine. Medicaid will not give an authorization even though the pt returned her machine in March 2018. I've tried multiple times to contact the pt (10/17/18, 10/30/18, 12/03/18, 03/01/19) and was never able to reach her to discuss the issues we're having.   Per Jeneen Rinks, we can do one of two things: We can provide an older machine w/ demo supplies to be considered an indigent setup, or we can do a $650 refurbished CPAP bundle if the pt wants a newer machine. Jeneen Rinks said to please reach out to him if you want to discuss this further."

## 2019-04-24 DIAGNOSIS — Z992 Dependence on renal dialysis: Secondary | ICD-10-CM | POA: Diagnosis not present

## 2019-04-24 DIAGNOSIS — N2581 Secondary hyperparathyroidism of renal origin: Secondary | ICD-10-CM | POA: Diagnosis not present

## 2019-04-24 DIAGNOSIS — N186 End stage renal disease: Secondary | ICD-10-CM | POA: Diagnosis not present

## 2019-04-25 ENCOUNTER — Encounter: Payer: Self-pay | Admitting: Internal Medicine

## 2019-04-25 DIAGNOSIS — E113512 Type 2 diabetes mellitus with proliferative diabetic retinopathy with macular edema, left eye: Secondary | ICD-10-CM | POA: Diagnosis not present

## 2019-04-26 DIAGNOSIS — Z992 Dependence on renal dialysis: Secondary | ICD-10-CM | POA: Diagnosis not present

## 2019-04-26 DIAGNOSIS — N2581 Secondary hyperparathyroidism of renal origin: Secondary | ICD-10-CM | POA: Diagnosis not present

## 2019-04-26 DIAGNOSIS — N186 End stage renal disease: Secondary | ICD-10-CM | POA: Diagnosis not present

## 2019-04-27 DIAGNOSIS — N2581 Secondary hyperparathyroidism of renal origin: Secondary | ICD-10-CM | POA: Diagnosis not present

## 2019-04-27 DIAGNOSIS — Z992 Dependence on renal dialysis: Secondary | ICD-10-CM | POA: Diagnosis not present

## 2019-04-27 DIAGNOSIS — N186 End stage renal disease: Secondary | ICD-10-CM | POA: Diagnosis not present

## 2019-04-30 DIAGNOSIS — N2581 Secondary hyperparathyroidism of renal origin: Secondary | ICD-10-CM | POA: Diagnosis not present

## 2019-04-30 DIAGNOSIS — N186 End stage renal disease: Secondary | ICD-10-CM | POA: Diagnosis not present

## 2019-04-30 DIAGNOSIS — Z992 Dependence on renal dialysis: Secondary | ICD-10-CM | POA: Diagnosis not present

## 2019-05-01 DIAGNOSIS — N186 End stage renal disease: Secondary | ICD-10-CM | POA: Diagnosis not present

## 2019-05-01 DIAGNOSIS — N2581 Secondary hyperparathyroidism of renal origin: Secondary | ICD-10-CM | POA: Diagnosis not present

## 2019-05-01 DIAGNOSIS — Z992 Dependence on renal dialysis: Secondary | ICD-10-CM | POA: Diagnosis not present

## 2019-05-03 DIAGNOSIS — Z992 Dependence on renal dialysis: Secondary | ICD-10-CM | POA: Diagnosis not present

## 2019-05-03 DIAGNOSIS — N2581 Secondary hyperparathyroidism of renal origin: Secondary | ICD-10-CM | POA: Diagnosis not present

## 2019-05-03 DIAGNOSIS — N186 End stage renal disease: Secondary | ICD-10-CM | POA: Diagnosis not present

## 2019-05-04 ENCOUNTER — Other Ambulatory Visit: Payer: Self-pay | Admitting: Internal Medicine

## 2019-05-06 DIAGNOSIS — N186 End stage renal disease: Secondary | ICD-10-CM | POA: Diagnosis not present

## 2019-05-06 DIAGNOSIS — Z992 Dependence on renal dialysis: Secondary | ICD-10-CM | POA: Diagnosis not present

## 2019-05-06 DIAGNOSIS — N2581 Secondary hyperparathyroidism of renal origin: Secondary | ICD-10-CM | POA: Diagnosis not present

## 2019-05-07 DIAGNOSIS — Z992 Dependence on renal dialysis: Secondary | ICD-10-CM | POA: Diagnosis not present

## 2019-05-07 DIAGNOSIS — N2581 Secondary hyperparathyroidism of renal origin: Secondary | ICD-10-CM | POA: Diagnosis not present

## 2019-05-07 DIAGNOSIS — N186 End stage renal disease: Secondary | ICD-10-CM | POA: Diagnosis not present

## 2019-05-07 NOTE — Telephone Encounter (Signed)
Please refill patient's prescription 

## 2019-05-08 DIAGNOSIS — N186 End stage renal disease: Secondary | ICD-10-CM | POA: Diagnosis not present

## 2019-05-08 DIAGNOSIS — Z992 Dependence on renal dialysis: Secondary | ICD-10-CM | POA: Diagnosis not present

## 2019-05-08 DIAGNOSIS — N2581 Secondary hyperparathyroidism of renal origin: Secondary | ICD-10-CM | POA: Diagnosis not present

## 2019-05-09 DIAGNOSIS — N2581 Secondary hyperparathyroidism of renal origin: Secondary | ICD-10-CM | POA: Diagnosis not present

## 2019-05-09 DIAGNOSIS — N186 End stage renal disease: Secondary | ICD-10-CM | POA: Diagnosis not present

## 2019-05-09 DIAGNOSIS — Z992 Dependence on renal dialysis: Secondary | ICD-10-CM | POA: Diagnosis not present

## 2019-05-10 DIAGNOSIS — N186 End stage renal disease: Secondary | ICD-10-CM | POA: Diagnosis not present

## 2019-05-10 DIAGNOSIS — Z992 Dependence on renal dialysis: Secondary | ICD-10-CM | POA: Diagnosis not present

## 2019-05-10 DIAGNOSIS — N2581 Secondary hyperparathyroidism of renal origin: Secondary | ICD-10-CM | POA: Diagnosis not present

## 2019-05-11 DIAGNOSIS — Z992 Dependence on renal dialysis: Secondary | ICD-10-CM | POA: Diagnosis not present

## 2019-05-11 DIAGNOSIS — N2581 Secondary hyperparathyroidism of renal origin: Secondary | ICD-10-CM | POA: Diagnosis not present

## 2019-05-11 DIAGNOSIS — N186 End stage renal disease: Secondary | ICD-10-CM | POA: Diagnosis not present

## 2019-05-12 DIAGNOSIS — N2581 Secondary hyperparathyroidism of renal origin: Secondary | ICD-10-CM | POA: Diagnosis not present

## 2019-05-12 DIAGNOSIS — N186 End stage renal disease: Secondary | ICD-10-CM | POA: Diagnosis not present

## 2019-05-12 DIAGNOSIS — Z992 Dependence on renal dialysis: Secondary | ICD-10-CM | POA: Diagnosis not present

## 2019-05-14 DIAGNOSIS — N2581 Secondary hyperparathyroidism of renal origin: Secondary | ICD-10-CM | POA: Diagnosis not present

## 2019-05-14 DIAGNOSIS — Z992 Dependence on renal dialysis: Secondary | ICD-10-CM | POA: Diagnosis not present

## 2019-05-14 DIAGNOSIS — N186 End stage renal disease: Secondary | ICD-10-CM | POA: Diagnosis not present

## 2019-05-15 DIAGNOSIS — N186 End stage renal disease: Secondary | ICD-10-CM | POA: Diagnosis not present

## 2019-05-15 DIAGNOSIS — Z992 Dependence on renal dialysis: Secondary | ICD-10-CM | POA: Diagnosis not present

## 2019-05-15 DIAGNOSIS — N2581 Secondary hyperparathyroidism of renal origin: Secondary | ICD-10-CM | POA: Diagnosis not present

## 2019-05-16 ENCOUNTER — Ambulatory Visit: Payer: BC Managed Care – PPO | Attending: Internal Medicine

## 2019-05-16 DIAGNOSIS — Z20822 Contact with and (suspected) exposure to covid-19: Secondary | ICD-10-CM

## 2019-05-16 DIAGNOSIS — Z20828 Contact with and (suspected) exposure to other viral communicable diseases: Secondary | ICD-10-CM | POA: Diagnosis not present

## 2019-05-17 DIAGNOSIS — N2581 Secondary hyperparathyroidism of renal origin: Secondary | ICD-10-CM | POA: Diagnosis not present

## 2019-05-17 DIAGNOSIS — E1122 Type 2 diabetes mellitus with diabetic chronic kidney disease: Secondary | ICD-10-CM | POA: Diagnosis not present

## 2019-05-17 DIAGNOSIS — Z992 Dependence on renal dialysis: Secondary | ICD-10-CM | POA: Diagnosis not present

## 2019-05-17 DIAGNOSIS — N186 End stage renal disease: Secondary | ICD-10-CM | POA: Diagnosis not present

## 2019-05-17 LAB — NOVEL CORONAVIRUS, NAA: SARS-CoV-2, NAA: NOT DETECTED

## 2019-05-18 DIAGNOSIS — N186 End stage renal disease: Secondary | ICD-10-CM | POA: Diagnosis not present

## 2019-05-18 DIAGNOSIS — I953 Hypotension of hemodialysis: Secondary | ICD-10-CM | POA: Diagnosis not present

## 2019-05-18 DIAGNOSIS — Z992 Dependence on renal dialysis: Secondary | ICD-10-CM | POA: Diagnosis not present

## 2019-05-18 DIAGNOSIS — N2581 Secondary hyperparathyroidism of renal origin: Secondary | ICD-10-CM | POA: Diagnosis not present

## 2019-05-21 DIAGNOSIS — N2581 Secondary hyperparathyroidism of renal origin: Secondary | ICD-10-CM | POA: Diagnosis not present

## 2019-05-21 DIAGNOSIS — Z992 Dependence on renal dialysis: Secondary | ICD-10-CM | POA: Diagnosis not present

## 2019-05-21 DIAGNOSIS — I953 Hypotension of hemodialysis: Secondary | ICD-10-CM | POA: Diagnosis not present

## 2019-05-21 DIAGNOSIS — N186 End stage renal disease: Secondary | ICD-10-CM | POA: Diagnosis not present

## 2019-05-22 DIAGNOSIS — Z992 Dependence on renal dialysis: Secondary | ICD-10-CM | POA: Diagnosis not present

## 2019-05-22 DIAGNOSIS — N186 End stage renal disease: Secondary | ICD-10-CM | POA: Diagnosis not present

## 2019-05-22 DIAGNOSIS — N2581 Secondary hyperparathyroidism of renal origin: Secondary | ICD-10-CM | POA: Diagnosis not present

## 2019-05-22 DIAGNOSIS — I953 Hypotension of hemodialysis: Secondary | ICD-10-CM | POA: Diagnosis not present

## 2019-05-23 ENCOUNTER — Ambulatory Visit (INDEPENDENT_AMBULATORY_CARE_PROVIDER_SITE_OTHER): Payer: BC Managed Care – PPO | Admitting: Podiatry

## 2019-05-23 ENCOUNTER — Encounter: Payer: Self-pay | Admitting: Podiatry

## 2019-05-23 ENCOUNTER — Other Ambulatory Visit: Payer: Self-pay

## 2019-05-23 VITALS — BP 151/88 | HR 106 | Temp 97.3°F

## 2019-05-23 DIAGNOSIS — L600 Ingrowing nail: Secondary | ICD-10-CM | POA: Diagnosis not present

## 2019-05-23 DIAGNOSIS — B351 Tinea unguium: Secondary | ICD-10-CM | POA: Diagnosis not present

## 2019-05-23 MED ORDER — NEOMYCIN-POLYMYXIN-HC 3.5-10000-1 OT SOLN: OTIC | 0 refills | Status: DC

## 2019-05-23 NOTE — Patient Instructions (Signed)

## 2019-05-23 NOTE — Progress Notes (Signed)
Subjective:   Patient ID: Cassandra Campos, female   DOB: 49 y.o.   MRN: [id number]   HPI Patient presents with 2 different problems 1 being in ingrown toenail left big toe medial border that is painful and secondarily previous ingrown right with irritation pain of the dorsal surface of the entire nail bed with thickness.  Patient does not smoke likes to be active   Review of Systems  All other systems reviewed and are negative.       Objective:  Physical Exam Vitals and nursing note reviewed.  Constitutional:      Appearance: She is well-developed.  Pulmonary:     Effort: Pulmonary effort is normal.  Musculoskeletal:        General: Normal range of motion.  Skin:    General: Skin is warm.  Neurological:     Mental Status: She is alert.     Neurovascular status intact muscle strength found to be adequate range of motion within normal limits.  Patient is noted to have incurvated left hallux medial border that is painful with no active drainage or redness noted with patient also having diabetes under good control.  The right hallux nail shows some looseness but there is no drainage or other pathology noted     Assessment:  Ingrown toenail deformity left hallux medial border with pain and mycotic condition with moderate damage right hallux nail bed     Plan:  H&P both conditions reviewed.  I recommended permanent correction of the left and I explained procedure risk and patient wants surgery understanding risk of this.  She signed consent form and today I infiltrated the left hallux 60 mg Xylocaine Marcaine mixture I remove the medial border exposed matrix and applied phenol 3 applications 30 seconds followed by alcohol lavage and sterile dressing.  Gave instructions for soaks and wrote prescription for drops and encouraged her to leave dressing on 24 hours but take it off earlier if throbbing were to occur and for the right 1 I do not recommend procedure but ultimately full  toenail removal may be necessary

## 2019-05-24 DIAGNOSIS — N186 End stage renal disease: Secondary | ICD-10-CM | POA: Diagnosis not present

## 2019-05-24 DIAGNOSIS — I953 Hypotension of hemodialysis: Secondary | ICD-10-CM | POA: Diagnosis not present

## 2019-05-24 DIAGNOSIS — Z992 Dependence on renal dialysis: Secondary | ICD-10-CM | POA: Diagnosis not present

## 2019-05-24 DIAGNOSIS — N2581 Secondary hyperparathyroidism of renal origin: Secondary | ICD-10-CM | POA: Diagnosis not present

## 2019-05-25 DIAGNOSIS — N2581 Secondary hyperparathyroidism of renal origin: Secondary | ICD-10-CM | POA: Diagnosis not present

## 2019-05-25 DIAGNOSIS — I953 Hypotension of hemodialysis: Secondary | ICD-10-CM | POA: Diagnosis not present

## 2019-05-25 DIAGNOSIS — Z992 Dependence on renal dialysis: Secondary | ICD-10-CM | POA: Diagnosis not present

## 2019-05-25 DIAGNOSIS — N186 End stage renal disease: Secondary | ICD-10-CM | POA: Diagnosis not present

## 2019-05-28 ENCOUNTER — Other Ambulatory Visit: Payer: Self-pay

## 2019-05-28 ENCOUNTER — Encounter: Payer: Self-pay | Admitting: Women's Health

## 2019-05-28 ENCOUNTER — Ambulatory Visit (INDEPENDENT_AMBULATORY_CARE_PROVIDER_SITE_OTHER): Payer: BC Managed Care – PPO | Admitting: Women's Health

## 2019-05-28 VITALS — BP 142/74

## 2019-05-28 DIAGNOSIS — Z01818 Encounter for other preprocedural examination: Secondary | ICD-10-CM | POA: Diagnosis not present

## 2019-05-28 DIAGNOSIS — Z992 Dependence on renal dialysis: Secondary | ICD-10-CM | POA: Diagnosis not present

## 2019-05-28 DIAGNOSIS — N898 Other specified noninflammatory disorders of vagina: Secondary | ICD-10-CM | POA: Diagnosis not present

## 2019-05-28 DIAGNOSIS — E1122 Type 2 diabetes mellitus with diabetic chronic kidney disease: Secondary | ICD-10-CM

## 2019-05-28 DIAGNOSIS — N2581 Secondary hyperparathyroidism of renal origin: Secondary | ICD-10-CM | POA: Diagnosis not present

## 2019-05-28 DIAGNOSIS — R300 Dysuria: Secondary | ICD-10-CM | POA: Diagnosis not present

## 2019-05-28 DIAGNOSIS — Z794 Long term (current) use of insulin: Secondary | ICD-10-CM | POA: Diagnosis not present

## 2019-05-28 DIAGNOSIS — I953 Hypotension of hemodialysis: Secondary | ICD-10-CM | POA: Diagnosis not present

## 2019-05-28 DIAGNOSIS — Z7682 Awaiting organ transplant status: Secondary | ICD-10-CM | POA: Diagnosis not present

## 2019-05-28 DIAGNOSIS — N186 End stage renal disease: Secondary | ICD-10-CM | POA: Diagnosis not present

## 2019-05-28 LAB — WET PREP FOR TRICH, YEAST, CLUE

## 2019-05-28 MED ORDER — FLUCONAZOLE 100 MG PO TABS: ORAL_TABLET | ORAL | 0 refills | Status: DC

## 2019-05-28 NOTE — Progress Notes (Signed)
49 year old MBF G5, P2 presents with complaint of vaginal itching and irritation for the past week.  Mild urinary frequency without pain at the end of the stream of urination.  Reports increased frequency of yeast infections over the past few months with good relief with Diflucan.  Denies vaginal odor, abdominal/back pain or fever.  Last hemoglobin A1c 10 prior hemoglobin A1c 7. Medical problems include diabetes on insulin greater than 20 years, kidney failure on home dialysis x1 year, hypercholesteremia and hypertension..  2001 TVH for menorrhagia on no HRT.  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  Obese, no CVAT.  External genitalia mild erythema, speculum exam scant discharge without odor, wet prep negative. UA: Negative glucose, +2 blood, +3 protein, trace leukocytes, 6-10 WBCs, 3-10 RBCs, 10-20 squamous epithelials, many bacteria  Recurrent vaginal itching Diabetes poor control/dialysis  Plan: Reviewed normality of wet prep and exam, Diflucan 100 mg 2 tablets today repeat as needed.  Boric acid gelcaps 600 mg twice weekly per vagina to help with prevention.  Reviewed correlation between elevated blood sugars and recurrent yeast and need to do better with diet/exercise.  Keep scheduled follow-up with endocrinologist.  Reviewed blood pressure slightly elevated today.  Aware of need to eat a low calorie/carb diet.  Urine culture pending.

## 2019-05-28 NOTE — Patient Instructions (Signed)
Boric acid gelcaps on amaz[employer] 30 for $10. Put one in VAGINA twice a week  Vaginal Yeast Infection, Adult  Vaginal yeast infection is a condition that causes vaginal discharge as well as soreness, swelling, and redness (inflammation) of the vagina. This is a common condition. Some women get this infection frequently. What are the causes? This condition is caused by a change in the normal balance of the yeast (candida) and bacteria that live in the vagina. This change causes an overgrowth of yeast, which causes the inflammation. What increases the risk? The condition is more likely to develop in women who:  Take antibiotic medicines.  Have diabetes.  Take birth control pills.  Are pregnant.  Douche often.  Have a weak body defense system (immune system).  Have been taking steroid medicines for a long time.  Frequently wear tight clothing. What are the signs or symptoms? Symptoms of this condition include:  White, thick, creamy vaginal discharge.  Swelling, itching, redness, and irritation of the vagina. The lips of the vagina (vulva) may be affected as well.  Pain or a burning feeling while urinating.  Pain during sex. How is this diagnosed? This condition is diagnosed based on:  Your medical history.  A physical exam.  A pelvic exam. Your health care provider will examine a sample of your vaginal discharge under a microscope. Your health care provider may send this sample for testing to confirm the diagnosis. How is this treated? This condition is treated with medicine. Medicines may be over-the-counter or prescription. You may be told to use one or more of the following:  Medicine that is taken by mouth (orally).  Medicine that is applied as a cream (topically).  Medicine that is inserted directly into the vagina (suppository). Follow these instructions at home:  Lifestyle  Do not have sex until your health care provider approves. Tell your sex partner that you  have a yeast infection. That person should go to his or her health care provider and ask if they should also be treated.  Do not wear tight clothes, such as pantyhose or tight pants.  Wear breathable cotton underwear. General instructions  Take or apply over-the-counter and prescription medicines only as told by your health care provider.  Eat more yogurt. This may help to keep your yeast infection from returning.  Do not use tampons until your health care provider approves.  Try taking a sitz bath to help with discomfort. This is a warm water bath that is taken while you are sitting down. The water should only come up to your hips and should cover your buttocks. Do this 3-4 times per day or as told by your health care provider.  Do not douche.  If you have diabetes, keep your blood sugar levels under control.  Keep all follow-up visits as told by your health care provider. This is important. Contact a health care provider if:  You have a fever.  Your symptoms go away and then return.  Your symptoms do not get better with treatment.  Your symptoms get worse.  You have new symptoms.  You develop blisters in or around your vagina.  You have blood coming from your vagina and it is not your menstrual period.  You develop pain in your abdomen. Summary  Vaginal yeast infection is a condition that causes discharge as well as soreness, swelling, and redness (inflammation) of the vagina.  This condition is treated with medicine. Medicines may be over-the-counter or prescription.  Take or apply  over-the-counter and prescription medicines only as told by your health care provider.  Do not douche. Do not have sex or use tampons until your health care provider approves.  Contact a health care provider if your symptoms do not get better with treatment or your symptoms go away and then return. This information is not intended to replace advice given to you by your health care  provider. Make sure you discuss any questions you have with your health care provider. Document Revised: 12/01/2018 Document Reviewed: 09/19/2017 Elsevier Patient Education  [place].

## 2019-05-29 DIAGNOSIS — N2581 Secondary hyperparathyroidism of renal origin: Secondary | ICD-10-CM | POA: Diagnosis not present

## 2019-05-29 DIAGNOSIS — I953 Hypotension of hemodialysis: Secondary | ICD-10-CM | POA: Diagnosis not present

## 2019-05-29 DIAGNOSIS — Z992 Dependence on renal dialysis: Secondary | ICD-10-CM | POA: Diagnosis not present

## 2019-05-29 DIAGNOSIS — N186 End stage renal disease: Secondary | ICD-10-CM | POA: Diagnosis not present

## 2019-05-30 LAB — URINALYSIS, COMPLETE W/RFL CULTURE
Bilirubin Urine: NEGATIVE
Glucose, UA: NEGATIVE
Hyaline Cast: NONE SEEN /LPF
Ketones, ur: NEGATIVE
Nitrites, Initial: NEGATIVE
Specific Gravity, Urine: 1.016 (ref 1.001–1.03)
pH: 5 (ref 5.0–8.0)

## 2019-05-30 LAB — URINE CULTURE
MICRO NUMBER:: 10027636
SPECIMEN QUALITY:: ADEQUATE

## 2019-05-30 LAB — CULTURE INDICATED

## 2019-05-31 DIAGNOSIS — N2581 Secondary hyperparathyroidism of renal origin: Secondary | ICD-10-CM | POA: Diagnosis not present

## 2019-05-31 DIAGNOSIS — I953 Hypotension of hemodialysis: Secondary | ICD-10-CM | POA: Diagnosis not present

## 2019-05-31 DIAGNOSIS — Z992 Dependence on renal dialysis: Secondary | ICD-10-CM | POA: Diagnosis not present

## 2019-05-31 DIAGNOSIS — N186 End stage renal disease: Secondary | ICD-10-CM | POA: Diagnosis not present

## 2019-06-01 DIAGNOSIS — I953 Hypotension of hemodialysis: Secondary | ICD-10-CM | POA: Diagnosis not present

## 2019-06-01 DIAGNOSIS — N2581 Secondary hyperparathyroidism of renal origin: Secondary | ICD-10-CM | POA: Diagnosis not present

## 2019-06-01 DIAGNOSIS — Z992 Dependence on renal dialysis: Secondary | ICD-10-CM | POA: Diagnosis not present

## 2019-06-01 DIAGNOSIS — N186 End stage renal disease: Secondary | ICD-10-CM | POA: Diagnosis not present

## 2019-06-04 DIAGNOSIS — N2581 Secondary hyperparathyroidism of renal origin: Secondary | ICD-10-CM | POA: Diagnosis not present

## 2019-06-04 DIAGNOSIS — I953 Hypotension of hemodialysis: Secondary | ICD-10-CM | POA: Diagnosis not present

## 2019-06-04 DIAGNOSIS — N186 End stage renal disease: Secondary | ICD-10-CM | POA: Diagnosis not present

## 2019-06-04 DIAGNOSIS — Z992 Dependence on renal dialysis: Secondary | ICD-10-CM | POA: Diagnosis not present

## 2019-06-05 DIAGNOSIS — I953 Hypotension of hemodialysis: Secondary | ICD-10-CM | POA: Diagnosis not present

## 2019-06-05 DIAGNOSIS — N2581 Secondary hyperparathyroidism of renal origin: Secondary | ICD-10-CM | POA: Diagnosis not present

## 2019-06-05 DIAGNOSIS — Z992 Dependence on renal dialysis: Secondary | ICD-10-CM | POA: Diagnosis not present

## 2019-06-05 DIAGNOSIS — N186 End stage renal disease: Secondary | ICD-10-CM | POA: Diagnosis not present

## 2019-06-07 DIAGNOSIS — Z992 Dependence on renal dialysis: Secondary | ICD-10-CM | POA: Diagnosis not present

## 2019-06-07 DIAGNOSIS — N2581 Secondary hyperparathyroidism of renal origin: Secondary | ICD-10-CM | POA: Diagnosis not present

## 2019-06-07 DIAGNOSIS — I953 Hypotension of hemodialysis: Secondary | ICD-10-CM | POA: Diagnosis not present

## 2019-06-07 DIAGNOSIS — N186 End stage renal disease: Secondary | ICD-10-CM | POA: Diagnosis not present

## 2019-06-08 DIAGNOSIS — I953 Hypotension of hemodialysis: Secondary | ICD-10-CM | POA: Diagnosis not present

## 2019-06-08 DIAGNOSIS — N2581 Secondary hyperparathyroidism of renal origin: Secondary | ICD-10-CM | POA: Diagnosis not present

## 2019-06-08 DIAGNOSIS — Z992 Dependence on renal dialysis: Secondary | ICD-10-CM | POA: Diagnosis not present

## 2019-06-08 DIAGNOSIS — N186 End stage renal disease: Secondary | ICD-10-CM | POA: Diagnosis not present

## 2019-06-11 DIAGNOSIS — Z992 Dependence on renal dialysis: Secondary | ICD-10-CM | POA: Diagnosis not present

## 2019-06-11 DIAGNOSIS — N186 End stage renal disease: Secondary | ICD-10-CM | POA: Diagnosis not present

## 2019-06-11 DIAGNOSIS — N2581 Secondary hyperparathyroidism of renal origin: Secondary | ICD-10-CM | POA: Diagnosis not present

## 2019-06-11 DIAGNOSIS — I953 Hypotension of hemodialysis: Secondary | ICD-10-CM | POA: Diagnosis not present

## 2019-06-12 DIAGNOSIS — N186 End stage renal disease: Secondary | ICD-10-CM | POA: Diagnosis not present

## 2019-06-12 DIAGNOSIS — N2581 Secondary hyperparathyroidism of renal origin: Secondary | ICD-10-CM | POA: Diagnosis not present

## 2019-06-12 DIAGNOSIS — I953 Hypotension of hemodialysis: Secondary | ICD-10-CM | POA: Diagnosis not present

## 2019-06-12 DIAGNOSIS — Z992 Dependence on renal dialysis: Secondary | ICD-10-CM | POA: Diagnosis not present

## 2019-06-14 DIAGNOSIS — Z992 Dependence on renal dialysis: Secondary | ICD-10-CM | POA: Diagnosis not present

## 2019-06-14 DIAGNOSIS — M65341 Trigger finger, right ring finger: Secondary | ICD-10-CM | POA: Diagnosis not present

## 2019-06-14 DIAGNOSIS — Z6841 Body Mass Index (BMI) 40.0 and over, adult: Secondary | ICD-10-CM | POA: Diagnosis not present

## 2019-06-14 DIAGNOSIS — N186 End stage renal disease: Secondary | ICD-10-CM | POA: Diagnosis not present

## 2019-06-14 DIAGNOSIS — N2581 Secondary hyperparathyroidism of renal origin: Secondary | ICD-10-CM | POA: Diagnosis not present

## 2019-06-14 DIAGNOSIS — I953 Hypotension of hemodialysis: Secondary | ICD-10-CM | POA: Diagnosis not present

## 2019-06-15 DIAGNOSIS — Z992 Dependence on renal dialysis: Secondary | ICD-10-CM | POA: Diagnosis not present

## 2019-06-15 DIAGNOSIS — I953 Hypotension of hemodialysis: Secondary | ICD-10-CM | POA: Diagnosis not present

## 2019-06-15 DIAGNOSIS — N2581 Secondary hyperparathyroidism of renal origin: Secondary | ICD-10-CM | POA: Diagnosis not present

## 2019-06-15 DIAGNOSIS — N186 End stage renal disease: Secondary | ICD-10-CM | POA: Diagnosis not present

## 2019-06-17 DIAGNOSIS — E1122 Type 2 diabetes mellitus with diabetic chronic kidney disease: Secondary | ICD-10-CM | POA: Diagnosis not present

## 2019-06-17 DIAGNOSIS — N186 End stage renal disease: Secondary | ICD-10-CM | POA: Diagnosis not present

## 2019-06-17 DIAGNOSIS — Z992 Dependence on renal dialysis: Secondary | ICD-10-CM | POA: Diagnosis not present

## 2019-06-18 DIAGNOSIS — N186 End stage renal disease: Secondary | ICD-10-CM | POA: Diagnosis not present

## 2019-06-18 DIAGNOSIS — I953 Hypotension of hemodialysis: Secondary | ICD-10-CM | POA: Diagnosis not present

## 2019-06-18 DIAGNOSIS — N2581 Secondary hyperparathyroidism of renal origin: Secondary | ICD-10-CM | POA: Diagnosis not present

## 2019-06-18 DIAGNOSIS — Z992 Dependence on renal dialysis: Secondary | ICD-10-CM | POA: Diagnosis not present

## 2019-06-19 DIAGNOSIS — N2581 Secondary hyperparathyroidism of renal origin: Secondary | ICD-10-CM | POA: Diagnosis not present

## 2019-06-19 DIAGNOSIS — N186 End stage renal disease: Secondary | ICD-10-CM | POA: Diagnosis not present

## 2019-06-19 DIAGNOSIS — I953 Hypotension of hemodialysis: Secondary | ICD-10-CM | POA: Diagnosis not present

## 2019-06-19 DIAGNOSIS — Z992 Dependence on renal dialysis: Secondary | ICD-10-CM | POA: Diagnosis not present

## 2019-06-21 DIAGNOSIS — I953 Hypotension of hemodialysis: Secondary | ICD-10-CM | POA: Diagnosis not present

## 2019-06-21 DIAGNOSIS — N186 End stage renal disease: Secondary | ICD-10-CM | POA: Diagnosis not present

## 2019-06-21 DIAGNOSIS — Z992 Dependence on renal dialysis: Secondary | ICD-10-CM | POA: Diagnosis not present

## 2019-06-21 DIAGNOSIS — N2581 Secondary hyperparathyroidism of renal origin: Secondary | ICD-10-CM | POA: Diagnosis not present

## 2019-06-22 DIAGNOSIS — N186 End stage renal disease: Secondary | ICD-10-CM | POA: Diagnosis not present

## 2019-06-22 DIAGNOSIS — Z992 Dependence on renal dialysis: Secondary | ICD-10-CM | POA: Diagnosis not present

## 2019-06-22 DIAGNOSIS — I953 Hypotension of hemodialysis: Secondary | ICD-10-CM | POA: Diagnosis not present

## 2019-06-22 DIAGNOSIS — N2581 Secondary hyperparathyroidism of renal origin: Secondary | ICD-10-CM | POA: Diagnosis not present

## 2019-06-25 DIAGNOSIS — Z992 Dependence on renal dialysis: Secondary | ICD-10-CM | POA: Diagnosis not present

## 2019-06-25 DIAGNOSIS — I953 Hypotension of hemodialysis: Secondary | ICD-10-CM | POA: Diagnosis not present

## 2019-06-25 DIAGNOSIS — N2581 Secondary hyperparathyroidism of renal origin: Secondary | ICD-10-CM | POA: Diagnosis not present

## 2019-06-25 DIAGNOSIS — N186 End stage renal disease: Secondary | ICD-10-CM | POA: Diagnosis not present

## 2019-06-26 DIAGNOSIS — I953 Hypotension of hemodialysis: Secondary | ICD-10-CM | POA: Diagnosis not present

## 2019-06-26 DIAGNOSIS — Z992 Dependence on renal dialysis: Secondary | ICD-10-CM | POA: Diagnosis not present

## 2019-06-26 DIAGNOSIS — N2581 Secondary hyperparathyroidism of renal origin: Secondary | ICD-10-CM | POA: Diagnosis not present

## 2019-06-26 DIAGNOSIS — N186 End stage renal disease: Secondary | ICD-10-CM | POA: Diagnosis not present

## 2019-06-28 DIAGNOSIS — N186 End stage renal disease: Secondary | ICD-10-CM | POA: Diagnosis not present

## 2019-06-28 DIAGNOSIS — I953 Hypotension of hemodialysis: Secondary | ICD-10-CM | POA: Diagnosis not present

## 2019-06-28 DIAGNOSIS — N2581 Secondary hyperparathyroidism of renal origin: Secondary | ICD-10-CM | POA: Diagnosis not present

## 2019-06-28 DIAGNOSIS — Z992 Dependence on renal dialysis: Secondary | ICD-10-CM | POA: Diagnosis not present

## 2019-06-29 ENCOUNTER — Encounter: Payer: Self-pay | Admitting: Internal Medicine

## 2019-06-29 DIAGNOSIS — N2581 Secondary hyperparathyroidism of renal origin: Secondary | ICD-10-CM | POA: Diagnosis not present

## 2019-06-29 DIAGNOSIS — I953 Hypotension of hemodialysis: Secondary | ICD-10-CM | POA: Diagnosis not present

## 2019-06-29 DIAGNOSIS — Z992 Dependence on renal dialysis: Secondary | ICD-10-CM | POA: Diagnosis not present

## 2019-06-29 DIAGNOSIS — N186 End stage renal disease: Secondary | ICD-10-CM | POA: Diagnosis not present

## 2019-07-02 DIAGNOSIS — N2581 Secondary hyperparathyroidism of renal origin: Secondary | ICD-10-CM | POA: Diagnosis not present

## 2019-07-02 DIAGNOSIS — N186 End stage renal disease: Secondary | ICD-10-CM | POA: Diagnosis not present

## 2019-07-02 DIAGNOSIS — Z992 Dependence on renal dialysis: Secondary | ICD-10-CM | POA: Diagnosis not present

## 2019-07-02 DIAGNOSIS — I953 Hypotension of hemodialysis: Secondary | ICD-10-CM | POA: Diagnosis not present

## 2019-07-03 DIAGNOSIS — I953 Hypotension of hemodialysis: Secondary | ICD-10-CM | POA: Diagnosis not present

## 2019-07-03 DIAGNOSIS — N186 End stage renal disease: Secondary | ICD-10-CM | POA: Diagnosis not present

## 2019-07-03 DIAGNOSIS — N2581 Secondary hyperparathyroidism of renal origin: Secondary | ICD-10-CM | POA: Diagnosis not present

## 2019-07-03 DIAGNOSIS — Z992 Dependence on renal dialysis: Secondary | ICD-10-CM | POA: Diagnosis not present

## 2019-07-04 ENCOUNTER — Other Ambulatory Visit: Payer: Self-pay

## 2019-07-04 ENCOUNTER — Ambulatory Visit (INDEPENDENT_AMBULATORY_CARE_PROVIDER_SITE_OTHER): Payer: BC Managed Care – PPO | Admitting: Internal Medicine

## 2019-07-04 VITALS — BP 136/78 | HR 107 | Temp 98.7°F | Ht 63.0 in | Wt 232.4 lb

## 2019-07-04 DIAGNOSIS — N644 Mastodynia: Secondary | ICD-10-CM

## 2019-07-04 DIAGNOSIS — Z6841 Body Mass Index (BMI) 40.0 and over, adult: Secondary | ICD-10-CM

## 2019-07-04 DIAGNOSIS — R000 Tachycardia, unspecified: Secondary | ICD-10-CM

## 2019-07-04 DIAGNOSIS — E0841 Diabetes mellitus due to underlying condition with diabetic mononeuropathy: Secondary | ICD-10-CM

## 2019-07-04 DIAGNOSIS — Z803 Family history of malignant neoplasm of breast: Secondary | ICD-10-CM

## 2019-07-04 MED ORDER — ALLOPURINOL 300 MG PO TABS: 300.0000 mg | ORAL_TABLET | Freq: Every morning | ORAL | 1 refills | Status: DC

## 2019-07-04 NOTE — Progress Notes (Addendum)
This visit occurred during the SARS-CoV-2 public health emergency.  Safety protocols were in place, including screening questions prior to the visit, additional usage of staff PPE, and extensive cleaning of exam room while observing appropriate contact time as indicated for disinfecting solutions.  Subjective:     Patient ID: Cassandra Campos , female    DOB: 07-Jan-1971 , 49 y.o.   MRN: [id number]   Chief Complaint  Patient presents with  . Breast Pain    bilateral    HPI  She c/o bilateral breast pain. Her sx started in left breast and now has radiated in the right breast. Described as a sharp, shooting pain. She is unable to determine what is triggering her pain. She denies nipple discharge. She thinks her last mammogram was in Oct 2020. Her maternal aunt has h/o breast cancer. She reports she felt pain prior to her diagnosis.  She has not noticed any lumps.    Past Medical History:  Diagnosis Date  . Anemia associated with chronic renal failure   . ASCUS of cervix with negative high risk HPV 06/2017  . CKD (chronic kidney disease), stage IV ([place]) no dialysis yet   nephrologist-  dr Jamal Maes-- scheduled next visit 09-08-2017 (lov 07-05-2017)  . Diabetic retinopathy ([place])   . Dialysis patient ([place])   . Elevated transaminase level   . Fatty liver    FOLLOWED BY DR Henrene Pastor  . Hypertension   . Idiopathic gout of multiple sites    09-05-2017 per pt stable  . Insulin dependent type 2 diabetes mellitus ([place])    followed by dr Toy Care  . Mixed hyperlipidemia   . OSA (obstructive sleep apnea)    per study 10-20-2015 mild osa w/ AHI 10.3/hr;  09-05-2017 per pt has not used cpap since 1 yr ago  . Peripheral neuropathy    feet  . S/P arteriovenous (AV) fistula creation 07-04-2015  w/ revision 02-14-2017   left braniocephilic  . Trigger thumb, right thumb   . Umbilical hernia      Family History  Problem Relation Age of Onset  . Diabetes Mother   . Hypertension Mother    . Diabetes Father   . Hypertension Father   . Cancer Father        STOMACH  . Stomach cancer Father   . Stroke Maternal Grandmother   . Stroke Maternal Grandfather   . Colon cancer Neg Hx   . Rectal cancer Neg Hx      Current Outpatient Medications:  .  allopurinol (ZYLOPRIM) 300 MG tablet, Take 1 tablet (300 mg total) by mouth every morning., Disp: 90 tablet, Rfl: 1 .  B Complex-C-Folic Acid (RENAL) 1 MG CAPS, TAKE 1 CAPSULE BY MOUTH DAILY (ON DIALYSIS DAYS, TAKE AFTER DIALYSIS TREATMENT ), Disp: , Rfl:  .  BELSOMRA 15 MG TABS, Take 1 tablet by mouth at bedtime., Disp: , Rfl:  .  benzonatate (TESSALON PERLES) 100 MG capsule, Take 1 capsule (100 mg total) by mouth 3 (three) times daily as needed for cough., Disp: 30 capsule, Rfl: 1 .  calcitRIOL (ROCALTROL) 0.5 MCG capsule, Take 0.5 mcg by mouth daily., Disp: , Rfl:  .  cinacalcet (SENSIPAR) 60 MG tablet, Take 60 mg by mouth daily., Disp: , Rfl:  .  cromolyn (OPTICROM) 4 % ophthalmic solution, 1 drop 4 (four) times daily., Disp: , Rfl:  .  EASY COMFORT PEN NEEDLES 31G X 8 MM MISC, INJECT TWICE A DAY AS DIRECTED, Disp: , Rfl: 3 .  HUMULIN R 500 UNIT/ML injection, 180 units in the morning and 140 units in the evening before meals (Patient taking differently: 170 units in the morning and 130 units in the evening before meals), Disp: 20 mL, Rfl: 11 .  lanthanum (FOSRENOL) 1000 MG chewable tablet, Chew 1,000 mg by mouth 3 (three) times daily with meals., Disp: , Rfl:  .  lidocaine-prilocaine (EMLA) cream, USE ON ARAMS AS NEEDED FOR DIALYSIS, Disp: , Rfl: 10 .  Microlet Lancets MISC, Use as directed to check blood sugars 4 times per day dx: e11.22, Disp: 300 each, Rfl: 2 .  MITIGARE 0.6 MG CAPS, TAKE ONE CAPSULE BY MOUTH ONCE DAILY, Disp: 90 capsule, Rfl: 0 .  neomycin-polymyxin-hydrocortisone (CORTISPORIN) OTIC solution, Apply 1-2 drops to the toe after soaking toe twice a day, Disp: 10 mL, Rfl: 0 .  OZEMPIC, 0.25 OR 0.5 MG/DOSE, 2 MG/1.5ML  SOPN, INJECT 0.5 MG INTO THE SKIN ONCE A WEEK., Disp: 1.5 mL, Rfl: 3 .  pregabalin (LYRICA) 100 MG capsule, TAKE 1 CAPSULE BY MOUTH IN THE MORNING FOR NEUROPATHY, Disp: 90 capsule, Rfl: 1 .  pregabalin (LYRICA) 75 MG capsule, Take one capsule po qam, Disp: 90 capsule, Rfl: 0 .  RESTASIS 0.05 % ophthalmic emulsion, INSTILL 1 DROP INTO BOTH EYES TWICE A DAY, Disp: , Rfl: 3 .  rosuvastatin (CRESTOR) 20 MG tablet, Take 1 tablet (20 mg total) by mouth daily., Disp: 90 tablet, Rfl: 3 .  Chlorphen-PE-Acetaminophen (NOREL AD) 4-10-325 MG TABS, Take 4-10 mg by mouth 2 (two) times daily as needed. (Patient not taking: Reported on 07/04/2019), Disp: 20 tablet, Rfl: 0 .  fluconazole (DIFLUCAN) 100 MG tablet, Take 2 today and repeat as neede (Patient not taking: Reported on 07/04/2019), Disp: 15 tablet, Rfl: 0   No Known Allergies   Review of Systems  Constitutional: Negative.   Respiratory: Negative.   Cardiovascular: Negative.   Gastrointestinal: Negative.   Neurological: Negative.   Psychiatric/Behavioral: Negative.      Today's Vitals   07/04/19 1531  BP: 136/78  Pulse: (!) 107  Temp: 98.7 F (37.1 C)  TempSrc: Oral  Weight: 232 lb 6.4 oz (105.4 kg)  Height: 5\' 3"  (1.6 m)   Body mass index is 41.17 kg/m.   Objective:  Physical Exam Vitals and nursing note reviewed.  Constitutional:      Appearance: Normal appearance.  HENT:     Head: Normocephalic and atraumatic.  Cardiovascular:     Rate and Rhythm: Normal rate and regular rhythm.     Heart sounds: Normal heart sounds.  Pulmonary:     Effort: Pulmonary effort is normal.     Breath sounds: Normal breath sounds.  Chest:     Breasts: Tanner Score is 5.        Right: Normal.        Left: Normal.  Skin:    General: Skin is warm.  Neurological:     General: No focal deficit present.     Mental Status: She is alert.  Psychiatric:        Mood and Affect: Mood normal.        Behavior: Behavior normal.         Assessment And  Plan:    1. Mastalgia  Her sx are bilateral. I will refer her for bilateral diagnostic mammo. Her last mammo performed Nov 2020 was wnl.   - MM Digital Diagnostic Bilat; Future  2. Diabetic mononeuropathy associated with diabetes mellitus due to underlying condition ([place])  We discussed recent elevated blood sugar readings. I will increase her Ozempic from 0.5mg  to 0.75mg  once weekly. She understands that she will inject 0.5mg  AND 0.25mg  ONCE weekly. If she tolerates this, then I plan to increase her to 1mg  dose once weekly. She verbalizes understanding of her treatment plan. She is encouraged to avoid sugary beverages and to increase her daily activity. She will f/u witj me in 4 weeks for re-evaluation.   3. Tachycardia  She is encouraged to increase hydration and avoid caffeinated beverages. She is encouraged to monitor her pulse and let me know if it stays elevated.   4. Class 3 severe obesity due to excess calories with serious comorbidity and body mass index (BMI) of 40.0 to 44.9 in adult [facility])  We discussed need for weight loss to decrease cardiac risk. Pt advised [facility] has surgical and non-surgical approaches to obesity. This location is optimal for her due to location, the [place] office is close to her job. At this time, she is interested in non-surgical option and is advised that she will likely have a virtual orientation session prior to her initial appt.   - Amb Referral to Bariatric Surgery    Maximino Greenland, MD    THE PATIENT IS ENCOURAGED TO PRACTICE SOCIAL DISTANCING DUE TO THE COVID-19 PANDEMIC.

## 2019-07-04 NOTE — Patient Instructions (Signed)

## 2019-07-05 ENCOUNTER — Ambulatory Visit: Payer: BC Managed Care – PPO | Admitting: Podiatry

## 2019-07-05 DIAGNOSIS — N186 End stage renal disease: Secondary | ICD-10-CM | POA: Diagnosis not present

## 2019-07-05 DIAGNOSIS — I953 Hypotension of hemodialysis: Secondary | ICD-10-CM | POA: Diagnosis not present

## 2019-07-05 DIAGNOSIS — Z992 Dependence on renal dialysis: Secondary | ICD-10-CM | POA: Diagnosis not present

## 2019-07-05 DIAGNOSIS — N2581 Secondary hyperparathyroidism of renal origin: Secondary | ICD-10-CM | POA: Diagnosis not present

## 2019-07-06 DIAGNOSIS — N186 End stage renal disease: Secondary | ICD-10-CM | POA: Diagnosis not present

## 2019-07-06 DIAGNOSIS — N2581 Secondary hyperparathyroidism of renal origin: Secondary | ICD-10-CM | POA: Diagnosis not present

## 2019-07-06 DIAGNOSIS — I953 Hypotension of hemodialysis: Secondary | ICD-10-CM | POA: Diagnosis not present

## 2019-07-06 DIAGNOSIS — Z992 Dependence on renal dialysis: Secondary | ICD-10-CM | POA: Diagnosis not present

## 2019-07-08 ENCOUNTER — Encounter: Payer: Self-pay | Admitting: Internal Medicine

## 2019-07-09 DIAGNOSIS — N186 End stage renal disease: Secondary | ICD-10-CM | POA: Diagnosis not present

## 2019-07-09 DIAGNOSIS — Z992 Dependence on renal dialysis: Secondary | ICD-10-CM | POA: Diagnosis not present

## 2019-07-09 DIAGNOSIS — N2581 Secondary hyperparathyroidism of renal origin: Secondary | ICD-10-CM | POA: Diagnosis not present

## 2019-07-09 DIAGNOSIS — I953 Hypotension of hemodialysis: Secondary | ICD-10-CM | POA: Diagnosis not present

## 2019-07-10 DIAGNOSIS — Z992 Dependence on renal dialysis: Secondary | ICD-10-CM | POA: Diagnosis not present

## 2019-07-10 DIAGNOSIS — N186 End stage renal disease: Secondary | ICD-10-CM | POA: Diagnosis not present

## 2019-07-10 DIAGNOSIS — N2581 Secondary hyperparathyroidism of renal origin: Secondary | ICD-10-CM | POA: Diagnosis not present

## 2019-07-10 DIAGNOSIS — I953 Hypotension of hemodialysis: Secondary | ICD-10-CM | POA: Diagnosis not present

## 2019-07-11 ENCOUNTER — Other Ambulatory Visit: Payer: Self-pay | Admitting: Internal Medicine

## 2019-07-11 DIAGNOSIS — N644 Mastodynia: Secondary | ICD-10-CM

## 2019-07-12 DIAGNOSIS — N2581 Secondary hyperparathyroidism of renal origin: Secondary | ICD-10-CM | POA: Diagnosis not present

## 2019-07-12 DIAGNOSIS — N186 End stage renal disease: Secondary | ICD-10-CM | POA: Diagnosis not present

## 2019-07-12 DIAGNOSIS — I953 Hypotension of hemodialysis: Secondary | ICD-10-CM | POA: Diagnosis not present

## 2019-07-12 DIAGNOSIS — Z992 Dependence on renal dialysis: Secondary | ICD-10-CM | POA: Diagnosis not present

## 2019-07-13 ENCOUNTER — Ambulatory Visit: Payer: BC Managed Care – PPO | Admitting: Podiatry

## 2019-07-13 DIAGNOSIS — N2581 Secondary hyperparathyroidism of renal origin: Secondary | ICD-10-CM | POA: Diagnosis not present

## 2019-07-13 DIAGNOSIS — I953 Hypotension of hemodialysis: Secondary | ICD-10-CM | POA: Diagnosis not present

## 2019-07-13 DIAGNOSIS — N186 End stage renal disease: Secondary | ICD-10-CM | POA: Diagnosis not present

## 2019-07-13 DIAGNOSIS — Z992 Dependence on renal dialysis: Secondary | ICD-10-CM | POA: Diagnosis not present

## 2019-07-15 DIAGNOSIS — N186 End stage renal disease: Secondary | ICD-10-CM | POA: Diagnosis not present

## 2019-07-15 DIAGNOSIS — Z992 Dependence on renal dialysis: Secondary | ICD-10-CM | POA: Diagnosis not present

## 2019-07-15 DIAGNOSIS — E1122 Type 2 diabetes mellitus with diabetic chronic kidney disease: Secondary | ICD-10-CM | POA: Diagnosis not present

## 2019-07-16 DIAGNOSIS — N186 End stage renal disease: Secondary | ICD-10-CM | POA: Diagnosis not present

## 2019-07-16 DIAGNOSIS — I953 Hypotension of hemodialysis: Secondary | ICD-10-CM | POA: Diagnosis not present

## 2019-07-16 DIAGNOSIS — N2581 Secondary hyperparathyroidism of renal origin: Secondary | ICD-10-CM | POA: Diagnosis not present

## 2019-07-16 DIAGNOSIS — Z992 Dependence on renal dialysis: Secondary | ICD-10-CM | POA: Diagnosis not present

## 2019-07-17 ENCOUNTER — Emergency Department (HOSPITAL_COMMUNITY): Payer: BC Managed Care – PPO

## 2019-07-17 ENCOUNTER — Emergency Department (HOSPITAL_COMMUNITY)
Admission: EM | Admit: 2019-07-17 | Discharge: 2019-07-18 | Disposition: A | Discharge status: Home / Self Care | Payer: BC Managed Care – PPO | Attending: Emergency Medicine | Admitting: Emergency Medicine

## 2019-07-17 DIAGNOSIS — Z79899 Other long term (current) drug therapy: Secondary | ICD-10-CM | POA: Insufficient documentation

## 2019-07-17 DIAGNOSIS — I120 Hypertensive chronic kidney disease with stage 5 chronic kidney disease or end stage renal disease: Secondary | ICD-10-CM | POA: Diagnosis not present

## 2019-07-17 DIAGNOSIS — N186 End stage renal disease: Secondary | ICD-10-CM | POA: Diagnosis not present

## 2019-07-17 DIAGNOSIS — Z794 Long term (current) use of insulin: Secondary | ICD-10-CM | POA: Diagnosis not present

## 2019-07-17 DIAGNOSIS — Z20822 Contact with and (suspected) exposure to covid-19: Secondary | ICD-10-CM | POA: Diagnosis not present

## 2019-07-17 DIAGNOSIS — R55 Syncope and collapse: Secondary | ICD-10-CM

## 2019-07-17 DIAGNOSIS — R05 Cough: Secondary | ICD-10-CM | POA: Diagnosis not present

## 2019-07-17 DIAGNOSIS — Z992 Dependence on renal dialysis: Secondary | ICD-10-CM | POA: Diagnosis not present

## 2019-07-17 DIAGNOSIS — R531 Weakness: Secondary | ICD-10-CM | POA: Diagnosis not present

## 2019-07-17 DIAGNOSIS — I953 Hypotension of hemodialysis: Secondary | ICD-10-CM | POA: Diagnosis not present

## 2019-07-17 DIAGNOSIS — E1122 Type 2 diabetes mellitus with diabetic chronic kidney disease: Secondary | ICD-10-CM | POA: Insufficient documentation

## 2019-07-17 DIAGNOSIS — R1111 Vomiting without nausea: Secondary | ICD-10-CM | POA: Diagnosis not present

## 2019-07-17 DIAGNOSIS — N2581 Secondary hyperparathyroidism of renal origin: Secondary | ICD-10-CM | POA: Diagnosis not present

## 2019-07-17 LAB — COMPREHENSIVE METABOLIC PANEL
ALT: 32 U/L (ref 0–44)
AST: 27 U/L (ref 15–41)
Albumin: 4.1 g/dL (ref 3.5–5.0)
Alkaline Phosphatase: 52 U/L (ref 38–126)
Anion gap: 15 (ref 5–15)
BUN: 35 mg/dL — ABNORMAL HIGH (ref 6–20)
CO2: 26 mmol/L (ref 22–32)
Calcium: 9.6 mg/dL (ref 8.9–10.3)
Chloride: 95 mmol/L — ABNORMAL LOW (ref 98–111)
Creatinine, Ser: 5.58 mg/dL — ABNORMAL HIGH (ref 0.44–1.00)
GFR calc Af Amer: 10 mL/min — ABNORMAL LOW (ref >60)
GFR calc non Af Amer: 8 mL/min — ABNORMAL LOW (ref >60)
Glucose, Bld: 148 mg/dL — ABNORMAL HIGH (ref 70–99)
Potassium: 3.3 mmol/L — ABNORMAL LOW (ref 3.5–5.1)
Sodium: 136 mmol/L (ref 135–145)
Total Bilirubin: 0.8 mg/dL (ref 0.3–1.2)
Total Protein: 7.7 g/dL (ref 6.5–8.1)

## 2019-07-17 LAB — CBC
HCT: 31.3 % — ABNORMAL LOW (ref 36.0–46.0)
Hemoglobin: 10.7 g/dL — ABNORMAL LOW (ref 12.0–15.0)
MCH: 30.7 pg (ref 26.0–34.0)
MCHC: 34.2 g/dL (ref 30.0–36.0)
MCV: 89.9 fL (ref 80.0–100.0)
Platelets: 153 10*3/uL (ref 150–400)
RBC: 3.48 MIL/uL — ABNORMAL LOW (ref 3.87–5.11)
RDW: 13.2 % (ref 11.5–15.5)
WBC: 7.4 10*3/uL (ref 4.0–10.5)
nRBC: 0 % (ref 0.0–0.2)

## 2019-07-17 LAB — LIPASE, BLOOD: Lipase: 64 U/L — ABNORMAL HIGH (ref 11–51)

## 2019-07-17 LAB — CBG MONITORING, ED: Glucose-Capillary: 138 mg/dL — ABNORMAL HIGH (ref 70–99)

## 2019-07-17 NOTE — ED Triage Notes (Signed)
Pt arrived via [facility] from home.   Pt was performing dialysis at home when she became dizzy and felt like she was going to pass out. Pt did have a syncopal episode along with N/V.  Pt A&Ox4, states she feels "wiped out"

## 2019-07-17 NOTE — ED Provider Notes (Signed)
[facility] EMERGENCY DEPARTMENT Provider Note   CSN: [id number] Arrival date & time: 07/17/19  2152     History Chief Complaint  Patient presents with  . Near Syncope    [facility] Cassandra Campos is a 49 y.o. female.  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  She states that she had been feeling weak and fatigued prior to starting her home dialysis tonight.  States that while she was on the dialysis machine she passed out.  Her husband found her, and states that she vomited through her mouth and nose.  He called EMS.  Patient states that she had some abdominal cramping earlier on today.  She denies any abdominal pain now.  She does report having cough, which started after the vomiting episode.  She denies shortness of breath.  Denies any recent illnesses.  Denies any fever or chills.  Denies any other associated symptoms.  The history is provided by the patient. No language interpreter was used.       Past Medical History:  Diagnosis Date  . Anemia associated with chronic renal failure   . ASCUS of cervix with negative high risk HPV 06/2017  . CKD (chronic kidney disease), stage IV ([place]) no dialysis yet   nephrologist-  dr Jamal Maes-- scheduled next visit 09-08-2017 (lov 07-05-2017)  . Diabetic retinopathy ([place])   . Dialysis patient ([place])   . Elevated transaminase level   . Fatty liver    FOLLOWED BY DR Henrene Pastor  . Hypertension   . Idiopathic gout of multiple sites    09-05-2017 per pt stable  . Insulin dependent type 2 diabetes mellitus ([place])    followed by dr Toy Care  . Mixed hyperlipidemia   . OSA (obstructive sleep apnea)    per study 10-20-2015 mild osa w/ AHI 10.3/hr;  09-05-2017 per pt has not used cpap since 1 yr ago  . Peripheral neuropathy    feet  . S/P arteriovenous (AV) fistula creation 07-04-2015  w/  revision 02-14-2017   left braniocephilic  . Trigger thumb, right thumb   . Umbilical hernia     Patient Active Problem List   Diagnosis Date Noted  . Morbid obesity ([place]) 08/31/2018  . Anemia of chronic kidney failure, stage 5 ([place]) 08/31/2018  . OSA (obstructive sleep apnea) 08/31/2018  . ESRD on hemodialysis ([place]) 08/31/2018  . Palpitations 07/04/2018  . Atypical chest pain 05/18/2018  . Poor compliance with CPAP treatment 08/02/2016  . Acute idiopathic gout of multiple sites 05/13/2016  . Bacterial vaginosis 10/11/2012  . Insulin dependent type 2 diabetes mellitus, controlled ([place])   . High triglycerides     Past Surgical History:  Procedure Laterality Date  . ABDOMINAL HYSTERECTOMY  07/11/2000   dr gott   TAH/BSO for irregular bleeding and leiomyoma  . AV FISTULA PLACEMENT Left 07/04/2015   Procedure: ARTERIOVENOUS (AV) FISTULA CREATION-LEFT;  Surgeon: Mal Misty, MD;  Location: [place];  Service: Vascular;  Laterality: Left;  . BREAST EXCISIONAL BIOPSY Right    benign  . CARPAL TUNNEL RELEASE Bilateral 1993 and 1994  . CESAREAN SECTION  1993:  09-25-1999   BILATERAL TUBAL LIGATION W/ LAST C/S  . DILATION AND CURETTAGE OF UTERUS    . EXPLORATORY LAPARTOMY / LYSIS ADHESIONS/  BILATERAL SALPINGOOPHORECTOMY  07-20-2011  dr s. Rhodia Albright  [facility]  . FISTULA SUPERFICIALIZATION Left 08/28/2015  Procedure: BRACHIOCEPHALIC FISTULA SUPERFICIALIZATION;  Surgeon: Mal Misty, MD;  Location: [place];  Service: Vascular;  Laterality: Left;  . PATCH ANGIOPLASTY Left 02/14/2017   Procedure: PATCH ANGIOPLASTY;  Surgeon: Elam Dutch, MD;  Location: [place];  Service: Vascular;  Laterality: Left;  . PELVIC LAPAROSCOPY  1994   exploration for ectopic preg.  Marland Kitchen RETINAL DETACHMENT SURGERY Left 2015  . REVISON OF ARTERIOVENOUS FISTULA Left 02/14/2017   Procedure: REVISON OF ARTERIOVENOUS FISTULA  LEFT ARM;  Surgeon: Elam Dutch, MD;  Location: [place];  Service: Vascular;  Laterality: Left;    . TRIGGER FINGER RELEASE Left 2015   thumb  . TRIGGER FINGER RELEASE Right 09/09/2017   Procedure: RELEASE TRIGGER FINGER/A-1 PULLEY RIGHT THUMB;  Surgeon: Dorna Leitz, MD;  Location: [facility];  Service: Orthopedics;  Laterality: Right;     OB History    Gravida  5   Para  2   Term  2   Preterm      AB  3   Living  2     SAB      TAB      Ectopic      Multiple      Live Births              Family History  Problem Relation Age of Onset  . Diabetes Mother   . Hypertension Mother   . Diabetes Father   . Hypertension Father   . Cancer Father        STOMACH  . Stomach cancer Father   . Stroke Maternal Grandmother   . Stroke Maternal Grandfather   . Colon cancer Neg Hx   . Rectal cancer Neg Hx     Social History   Tobacco Use  . Smoking status: Never Smoker  . Smokeless tobacco: Never Used  Substance Use Topics  . Alcohol use: Never    Alcohol/week: 0.0 standard drinks  . Drug use: No    Home Medications Prior to Admission medications   Medication Sig Start Date End Date Taking? Authorizing Provider  allopurinol (ZYLOPRIM) 300 MG tablet Take 1 tablet (300 mg total) by mouth every morning. 07/04/19   Glendale Chard, MD  B Complex-C-Folic Acid (RENAL) 1 MG CAPS TAKE 1 CAPSULE BY MOUTH DAILY (ON DIALYSIS DAYS, TAKE AFTER DIALYSIS TREATMENT ) 09/18/18   [provider]  BELSOMRA 15 MG TABS Take 1 tablet by mouth at bedtime. 05/07/19   [provider]  benzonatate (TESSALON PERLES) 100 MG capsule Take 1 capsule (100 mg total) by mouth 3 (three) times daily as needed for cough. 02/07/19 02/07/20  Glendale Chard, MD  calcitRIOL (ROCALTROL) 0.5 MCG capsule Take 0.5 mcg by mouth daily.    [provider]  Chlorphen-PE-Acetaminophen (NOREL AD) 4-10-325 MG TABS Take 4-10 mg by mouth 2 (two) times daily as needed. Patient not taking: Reported on 07/04/2019 09/26/18   Glendale Chard, MD  cinacalcet (SENSIPAR) 60 MG tablet  Take 60 mg by mouth daily.    [provider]  cromolyn (OPTICROM) 4 % ophthalmic solution 1 drop 4 (four) times daily. 04/17/19   [provider]  EASY COMFORT PEN NEEDLES 31G X 8 MM Startup A DAY AS DIRECTED 03/15/18   [provider]  fluconazole (DIFLUCAN) 100 MG tablet Take 2 today and repeat as neede Patient not taking: Reported on 07/04/2019 05/28/19   Huel Cote, NP  HUMULIN R 500 UNIT/ML injection 180 units in  the morning and 140 units in the evening before meals Patient taking differently: 170 units in the morning and 130 units in the evening before meals 03/27/18   Glendale Chard, MD  lanthanum (FOSRENOL) 1000 MG chewable tablet Chew 1,000 mg by mouth 3 (three) times daily with meals.    [provider]  lidocaine-prilocaine (EMLA) cream USE ON ARAMS AS NEEDED FOR DIALYSIS 03/27/18   [provider]  Microlet Lancets MISC Use as directed to check blood sugars 4 times per day dx: e11.22 12/04/18   Glendale Chard, MD  MITIGARE 0.6 MG CAPS TAKE ONE CAPSULE BY MOUTH ONCE DAILY 03/02/19   Glendale Chard, MD  neomycin-polymyxin-hydrocortisone (CORTISPORIN) OTIC solution Apply 1-2 drops to the toe after soaking toe twice a day 05/23/19   Wallene Huh, DPM  OZEMPIC, 0.25 OR 0.5 MG/DOSE, 2 MG/1.5ML SOPN INJECT 0.5 MG INTO THE SKIN ONCE A WEEK. 11/16/18   Glendale Chard, MD  pregabalin (LYRICA) 100 MG capsule TAKE 1 CAPSULE BY MOUTH IN THE MORNING FOR NEUROPATHY 05/07/19   Glendale Chard, MD  pregabalin (LYRICA) 75 MG capsule Take one capsule po qam 01/18/19   Rodriguez-Southworth, Sunday Spillers, PA-C  RESTASIS 0.05 % ophthalmic emulsion INSTILL 1 DROP INTO BOTH EYES TWICE A DAY 03/14/18   [provider]  rosuvastatin (CRESTOR) 20 MG tablet Take 1 tablet (20 mg total) by mouth daily. 09/19/18 07/04/19  Hilty, Nadean Corwin, MD    Allergies    Patient has no known allergies.  Review of Systems   Review of Systems  All other systems reviewed and  are negative.   Physical Exam Updated Vital Signs BP 117/67 (BP Location: Right Arm)   Pulse (!) 101   Temp 98.4 F (36.9 C) (Oral)   Resp 13   SpO2 100%   Physical Exam Vitals and nursing note reviewed.  Constitutional:      General: She is not in acute distress.    Appearance: She is well-developed.  HENT:     Head: Normocephalic and atraumatic.  Eyes:     Conjunctiva/sclera: Conjunctivae normal.  Cardiovascular:     Rate and Rhythm: Normal rate and regular rhythm.     Heart sounds: No murmur.  Pulmonary:     Effort: Pulmonary effort is normal. No respiratory distress.     Breath sounds: Normal breath sounds.  Abdominal:     Palpations: Abdomen is soft.     Tenderness: There is no abdominal tenderness.  Musculoskeletal:        General: Normal range of motion.     Cervical back: Neck supple.  Skin:    General: Skin is warm and dry.  Neurological:     Mental Status: She is alert and oriented to person, place, and time.  Psychiatric:        Mood and Affect: Mood normal.        Behavior: Behavior normal.     ED Results / Procedures / Treatments   Labs (all labs ordered are listed, but only abnormal results are displayed) Labs Reviewed  CBC - Abnormal; Notable for the following components:      Result Value   RBC 3.48 (*)    Hemoglobin 10.7 (*)    HCT 31.3 (*)    All other components within normal limits  CBG MONITORING, ED - Abnormal; Notable for the following components:   Glucose-Capillary 138 (*)    All other components within normal limits  SARS CORONAVIRUS 2 (TAT 6-24 HRS)  LACTIC ACID,  PLASMA  LIPASE, BLOOD  COMPREHENSIVE METABOLIC PANEL    EKG EKG Interpretation  Date/Time:  Tuesday July 17 2019 22:17:32 EST Ventricular Rate:  109 PR Interval:    QRS Duration: 74 QT Interval:  376 QTC Calculation: 507 R Axis:   1 Text Interpretation: Sinus tachycardia Anterior infarct, old No significant change since last tracing Confirmed by Ripley Fraise [phone]) on 07/17/2019 11:49:33 PM   Radiology DG Chest Port 1 View  Result Date: 07/17/2019 CLINICAL DATA:  49 year old female with cough and aspiration. EXAM: PORTABLE CHEST 1 VIEW COMPARISON:  Chest radiograph dated 07/27/2015. FINDINGS: The heart size and mediastinal contours are within normal limits. Both lungs are clear. The visualized skeletal structures are unremarkable. IMPRESSION: No active disease. Electronically Signed   By: Anner Crete M.D.   On: 07/17/2019 22:44    Procedures Procedures (including critical care time)  Medications Ordered in ED Medications - No data to display  ED Course  I have reviewed the triage vital signs and the nursing notes.  Pertinent labs & imaging results that were available during my care of the patient were reviewed by me and considered in my medical decision making (see chart for details).    MDM Rules/Calculators/A&P                      Patient here with syncope during home HD.  She states that she felt a little fatigued and had an upset stomach prior to performing dialysis.   She states that she passed out during dialysis and her husband found her.  She had also vomited.    Labs are fairly reassuring.  K is 3.3. Cr. Baseline. CBG 148.  H/H is stable.  No leukocytosis.  CXR ordered due to possible aspiration, but was negative.  Lipase is mildly elevated, but no abdominal pain or discomfort presently.  No abdominal tenderness.  Doubt surgical or acute abdomen.  Covid test pending.  Bedside US reveals no pericardial effusion.  Performed by Dr. Christy Gentles, see his note for details.  Patient feels improved following 551ml NS bolus.  She is agreeable with plan for discharge.  Return precautions given.  Seen by and discussed with Dr. Christy Gentles.    Final Clinical Impression(s) / ED Diagnoses Final diagnoses:  Syncope, unspecified syncope type    Rx / DC Orders ED Discharge Orders         Ordered    ondansetron (ZOFRAN) 4 MG  tablet  Every 8 hours PRN     07/18/19 0306           Montine Circle, PA-C 07/18/19 0314    Long, Wonda Olds, MD 07/18/19 1329

## 2019-07-18 LAB — SARS CORONAVIRUS 2 (TAT 6-24 HRS): SARS Coronavirus 2: NEGATIVE

## 2019-07-18 LAB — LACTIC ACID, PLASMA: Lactic Acid, Venous: 2 mmol/L (ref 0.5–1.9)

## 2019-07-18 MED ORDER — SODIUM CHLORIDE 0.9 % IV BOLUS
500.0000 mL | Freq: Once | INTRAVENOUS | Status: AC
  Administered 2019-07-18: 500 mL via INTRAVENOUS

## 2019-07-18 MED ORDER — ONDANSETRON HCL 4 MG PO TABS: 4.0000 mg | ORAL_TABLET | Freq: Three times a day (TID) | ORAL | 0 refills | Status: DC | PRN

## 2019-07-18 NOTE — Discharge Instructions (Signed)
No clear emergent cause of your symptoms was identified tonight. It is possible that your blood pressure dropped when doing dialysis and that caused you to pass out.   Your lab tests and x-ray reveal no concerning findings.  We recommend that you follow-up with your doctor.    Return to the ER for new or worsening symptoms.

## 2019-07-18 NOTE — ED Provider Notes (Signed)
Ultrasound ED Echo  Date/Time: 07/18/2019 1:21 AM Performed by: Ripley Fraise, MD Authorized by: Ripley Fraise, MD   Procedure details:    Indications comment:  Tachycardia   Views: parasternal long axis view     Images: archived     Limitations:  Body habitus and positioning Findings:    Pericardium: no pericardial effusion   Impression:    Impression: normal     Pt reports nausea/vomiting and syncopal while performing home dialysis Denies CP/SOB Reports mild dizziness but no focal weakness Will give gentle fluids and reassess   Ripley Fraise, MD 07/18/19 0122

## 2019-07-19 DIAGNOSIS — N2581 Secondary hyperparathyroidism of renal origin: Secondary | ICD-10-CM | POA: Diagnosis not present

## 2019-07-19 DIAGNOSIS — Z992 Dependence on renal dialysis: Secondary | ICD-10-CM | POA: Diagnosis not present

## 2019-07-19 DIAGNOSIS — N186 End stage renal disease: Secondary | ICD-10-CM | POA: Diagnosis not present

## 2019-07-19 DIAGNOSIS — I953 Hypotension of hemodialysis: Secondary | ICD-10-CM | POA: Diagnosis not present

## 2019-07-20 DIAGNOSIS — N2581 Secondary hyperparathyroidism of renal origin: Secondary | ICD-10-CM | POA: Diagnosis not present

## 2019-07-20 DIAGNOSIS — I953 Hypotension of hemodialysis: Secondary | ICD-10-CM | POA: Diagnosis not present

## 2019-07-20 DIAGNOSIS — N186 End stage renal disease: Secondary | ICD-10-CM | POA: Diagnosis not present

## 2019-07-20 DIAGNOSIS — Z992 Dependence on renal dialysis: Secondary | ICD-10-CM | POA: Diagnosis not present

## 2019-07-23 ENCOUNTER — Other Ambulatory Visit: Payer: Self-pay

## 2019-07-23 DIAGNOSIS — I953 Hypotension of hemodialysis: Secondary | ICD-10-CM | POA: Diagnosis not present

## 2019-07-23 DIAGNOSIS — N186 End stage renal disease: Secondary | ICD-10-CM | POA: Diagnosis not present

## 2019-07-23 DIAGNOSIS — N2581 Secondary hyperparathyroidism of renal origin: Secondary | ICD-10-CM | POA: Diagnosis not present

## 2019-07-23 DIAGNOSIS — Z992 Dependence on renal dialysis: Secondary | ICD-10-CM | POA: Diagnosis not present

## 2019-07-24 ENCOUNTER — Ambulatory Visit (INDEPENDENT_AMBULATORY_CARE_PROVIDER_SITE_OTHER): Payer: BC Managed Care – PPO | Admitting: Obstetrics and Gynecology

## 2019-07-24 ENCOUNTER — Encounter: Payer: Self-pay | Admitting: Obstetrics and Gynecology

## 2019-07-24 ENCOUNTER — Encounter: Payer: BLUE CROSS/BLUE SHIELD | Admitting: Gynecology

## 2019-07-24 VITALS — BP 134/82 | Ht 63.0 in | Wt 230.0 lb

## 2019-07-24 DIAGNOSIS — I953 Hypotension of hemodialysis: Secondary | ICD-10-CM | POA: Diagnosis not present

## 2019-07-24 DIAGNOSIS — Z01419 Encounter for gynecological examination (general) (routine) without abnormal findings: Secondary | ICD-10-CM

## 2019-07-24 DIAGNOSIS — N186 End stage renal disease: Secondary | ICD-10-CM | POA: Diagnosis not present

## 2019-07-24 DIAGNOSIS — Z992 Dependence on renal dialysis: Secondary | ICD-10-CM | POA: Diagnosis not present

## 2019-07-24 DIAGNOSIS — N2581 Secondary hyperparathyroidism of renal origin: Secondary | ICD-10-CM | POA: Diagnosis not present

## 2019-07-24 NOTE — Progress Notes (Signed)
[place] 01/01/1971 [id number]  SUBJECTIVE:  49 y.o. [id number] female for annual routine gynecologic exam.  Having some hot flashes and night sweats off of HRT but doing okay. Notes occasional vaginal moisture since stopping HRT, but no odor or discharge or irritation. No urinary leakage. She has no gynecologic concerns.  Current Outpatient Medications  Medication Sig Dispense Refill  . allopurinol (ZYLOPRIM) 300 MG tablet Take 1 tablet (300 mg total) by mouth every morning. 90 tablet 1  . B Complex-C-Folic Acid (RENAL) 1 MG CAPS TAKE 1 CAPSULE BY MOUTH DAILY (ON DIALYSIS DAYS, TAKE AFTER DIALYSIS TREATMENT )    . BELSOMRA 15 MG TABS Take 1 tablet by mouth at bedtime.    . benzonatate (TESSALON PERLES) 100 MG capsule Take 1 capsule (100 mg total) by mouth 3 (three) times daily as needed for cough. 30 capsule 1  . calcitRIOL (ROCALTROL) 0.5 MCG capsule Take 0.5 mcg by mouth daily.    . Chlorphen-PE-Acetaminophen (NOREL AD) 4-10-325 MG TABS Take 4-10 mg by mouth 2 (two) times daily as needed. 20 tablet 0  . cinacalcet (SENSIPAR) 60 MG tablet Take 60 mg by mouth daily.    . cromolyn (OPTICROM) 4 % ophthalmic solution 1 drop 4 (four) times daily.    Marland Kitchen EASY COMFORT PEN NEEDLES 31G X 8 MM MISC INJECT TWICE A DAY AS DIRECTED  3  . HUMULIN R 500 UNIT/ML injection 180 units in the morning and 140 units in the evening before meals (Patient taking differently: 170 units in the morning and 130 units in the evening before meals) 20 mL 11  . lanthanum (FOSRENOL) 1000 MG chewable tablet Chew 1,000 mg by mouth 3 (three) times daily with meals.    . lidocaine-prilocaine (EMLA) cream USE ON [place] AS NEEDED FOR DIALYSIS  10  . Microlet Lancets MISC Use as directed to check blood sugars 4 times per day dx: e11.22 300 each 2  . MITIGARE 0.6 MG CAPS TAKE ONE CAPSULE BY MOUTH ONCE DAILY 90 capsule 0  . ondansetron (ZOFRAN) 4 MG tablet Take 1 tablet (4 mg total) by mouth every 8 (eight) hours as needed  for nausea or vomiting. 10 tablet 0  . OZEMPIC, 0.25 OR 0.5 MG/DOSE, 2 MG/1.5ML SOPN INJECT 0.5 MG INTO THE SKIN ONCE A WEEK. 1.5 mL 3  . pregabalin (LYRICA) 100 MG capsule TAKE 1 CAPSULE BY MOUTH IN THE MORNING FOR NEUROPATHY 90 capsule 1  . pregabalin (LYRICA) 75 MG capsule Take one capsule po qam 90 capsule 0  . RESTASIS 0.05 % ophthalmic emulsion INSTILL 1 DROP INTO BOTH EYES TWICE A DAY  3  . fluconazole (DIFLUCAN) 100 MG tablet Take 2 today and repeat as neede (Patient not taking: Reported on 07/04/2019) 15 tablet 0  . neomycin-polymyxin-hydrocortisone (CORTISPORIN) OTIC solution Apply 1-2 drops to the toe after soaking toe twice a day (Patient not taking: Reported on 07/24/2019) 10 mL 0  . rosuvastatin (CRESTOR) 20 MG tablet Take 1 tablet (20 mg total) by mouth daily. 90 tablet 3   No current facility-administered medications for this visit.   Allergies: Patient has no known allergies.  No LMP recorded. Patient has had a hysterectomy.  Past medical history,surgical history, problem list, medications, allergies, family history and social history were all reviewed and documented as reviewed in the EPIC chart.  ROS:  Feeling well. No dyspnea or chest pain on exertion.  No abdominal pain, change in bowel habits, black or bloody stools.  No urinary tract  symptoms. GYN ROS: no abnormal bleeding, pelvic pain or discharge, no breast pain or new or enlarging lumps on self exam. No neurological complaints.    OBJECTIVE:  BP 134/82   Ht 5\' 3"  (1.6 m)   Wt 230 lb (104.3 kg)   BMI 40.74 kg/m  The patient appears well, alert, oriented x 3, in no distress. ENT normal.  Neck supple. No cervical or supraclavicular adenopathy or thyromegaly.  Lungs are clear, good air entry, no wheezes, rhonchi or rales. S1 and S2 normal, no murmurs, regular rate and rhythm.  Abdomen soft without tenderness, guarding, mass or organomegaly.  Neurological is normal, no focal findings.  BREAST EXAM: breasts appear  normal, no suspicious masses, no skin or nipple changes or axillary nodes  PELVIC EXAM: VULVA: normal appearing vulva with no masses, tenderness or lesions, VAGINA: normal appearing vagina with normal color and discharge, no lesions, CERVIX: surgically absent, UTERUS: surgically absent, vaginal cuff well healed, ADNEXA: no masses  Chaperone: Caryn Bee present during the examination  ASSESSMENT:  49 y.o. [id number] here for annual gynecologic exam  PLAN:   1. Postmenopausal. Prior TAH and subsequent BSO. Previously had been on HRT and having some symptoms off of it but manageable.  We discussed potential alternative options.  She is okay without any treatment right now.  Discussed atrophic vaginitis and discharge that can result from that condition. 2. Pap smear 06/2018 normal.   ASCUS Pap smear in 2019.  Next Pap smear due 2023 following the current guidelines recommending the 3 year interval. 3. Mammogram 03/2019.  Normal breast exam today.  She is reminded to schedule an annual mammogram this year when due. 4. Colonoscopy 2018.  Recommended that she follow up at the recommended interval.  5. Health maintenance.  No labs today as she normally has these completed with her internist. History of hypertension, DM, end-stage renal disease and she is actively followed by internal medicine.  Return annually or sooner, prn.  Joseph Pierini MD  07/24/19

## 2019-07-25 ENCOUNTER — Ambulatory Visit: Payer: BC Managed Care – PPO

## 2019-07-25 ENCOUNTER — Other Ambulatory Visit: Payer: Self-pay

## 2019-07-25 ENCOUNTER — Ambulatory Visit
Admission: RE | Admit: 2019-07-25 | Discharge: 2019-07-25 | Disposition: A | Discharge status: Home / Self Care | Payer: BC Managed Care – PPO | Source: Ambulatory Visit | Attending: Internal Medicine | Admitting: Internal Medicine

## 2019-07-25 DIAGNOSIS — R922 Inconclusive mammogram: Secondary | ICD-10-CM | POA: Diagnosis not present

## 2019-07-25 DIAGNOSIS — N644 Mastodynia: Secondary | ICD-10-CM

## 2019-07-26 DIAGNOSIS — I953 Hypotension of hemodialysis: Secondary | ICD-10-CM | POA: Diagnosis not present

## 2019-07-26 DIAGNOSIS — N2581 Secondary hyperparathyroidism of renal origin: Secondary | ICD-10-CM | POA: Diagnosis not present

## 2019-07-26 DIAGNOSIS — N186 End stage renal disease: Secondary | ICD-10-CM | POA: Diagnosis not present

## 2019-07-26 DIAGNOSIS — Z992 Dependence on renal dialysis: Secondary | ICD-10-CM | POA: Diagnosis not present

## 2019-07-27 DIAGNOSIS — Z23 Encounter for immunization: Secondary | ICD-10-CM | POA: Diagnosis not present

## 2019-07-27 DIAGNOSIS — I953 Hypotension of hemodialysis: Secondary | ICD-10-CM | POA: Diagnosis not present

## 2019-07-27 DIAGNOSIS — N186 End stage renal disease: Secondary | ICD-10-CM | POA: Diagnosis not present

## 2019-07-27 DIAGNOSIS — N2581 Secondary hyperparathyroidism of renal origin: Secondary | ICD-10-CM | POA: Diagnosis not present

## 2019-07-27 DIAGNOSIS — Z992 Dependence on renal dialysis: Secondary | ICD-10-CM | POA: Diagnosis not present

## 2019-07-30 DIAGNOSIS — I953 Hypotension of hemodialysis: Secondary | ICD-10-CM | POA: Diagnosis not present

## 2019-07-30 DIAGNOSIS — N2581 Secondary hyperparathyroidism of renal origin: Secondary | ICD-10-CM | POA: Diagnosis not present

## 2019-07-30 DIAGNOSIS — N186 End stage renal disease: Secondary | ICD-10-CM | POA: Diagnosis not present

## 2019-07-30 DIAGNOSIS — Z992 Dependence on renal dialysis: Secondary | ICD-10-CM | POA: Diagnosis not present

## 2019-07-31 DIAGNOSIS — Z992 Dependence on renal dialysis: Secondary | ICD-10-CM | POA: Diagnosis not present

## 2019-07-31 DIAGNOSIS — I953 Hypotension of hemodialysis: Secondary | ICD-10-CM | POA: Diagnosis not present

## 2019-07-31 DIAGNOSIS — N2581 Secondary hyperparathyroidism of renal origin: Secondary | ICD-10-CM | POA: Diagnosis not present

## 2019-07-31 DIAGNOSIS — N186 End stage renal disease: Secondary | ICD-10-CM | POA: Diagnosis not present

## 2019-08-02 DIAGNOSIS — N2581 Secondary hyperparathyroidism of renal origin: Secondary | ICD-10-CM | POA: Diagnosis not present

## 2019-08-02 DIAGNOSIS — N186 End stage renal disease: Secondary | ICD-10-CM | POA: Diagnosis not present

## 2019-08-02 DIAGNOSIS — I953 Hypotension of hemodialysis: Secondary | ICD-10-CM | POA: Diagnosis not present

## 2019-08-02 DIAGNOSIS — Z992 Dependence on renal dialysis: Secondary | ICD-10-CM | POA: Diagnosis not present

## 2019-08-03 DIAGNOSIS — Z992 Dependence on renal dialysis: Secondary | ICD-10-CM | POA: Diagnosis not present

## 2019-08-03 DIAGNOSIS — I953 Hypotension of hemodialysis: Secondary | ICD-10-CM | POA: Diagnosis not present

## 2019-08-03 DIAGNOSIS — N186 End stage renal disease: Secondary | ICD-10-CM | POA: Diagnosis not present

## 2019-08-03 DIAGNOSIS — N2581 Secondary hyperparathyroidism of renal origin: Secondary | ICD-10-CM | POA: Diagnosis not present

## 2019-08-06 DIAGNOSIS — N2581 Secondary hyperparathyroidism of renal origin: Secondary | ICD-10-CM | POA: Diagnosis not present

## 2019-08-06 DIAGNOSIS — Z992 Dependence on renal dialysis: Secondary | ICD-10-CM | POA: Diagnosis not present

## 2019-08-06 DIAGNOSIS — I953 Hypotension of hemodialysis: Secondary | ICD-10-CM | POA: Diagnosis not present

## 2019-08-06 DIAGNOSIS — N186 End stage renal disease: Secondary | ICD-10-CM | POA: Diagnosis not present

## 2019-08-07 ENCOUNTER — Ambulatory Visit (INDEPENDENT_AMBULATORY_CARE_PROVIDER_SITE_OTHER): Payer: BC Managed Care – PPO | Admitting: Internal Medicine

## 2019-08-07 ENCOUNTER — Encounter: Payer: Self-pay | Admitting: Internal Medicine

## 2019-08-07 ENCOUNTER — Other Ambulatory Visit: Payer: Self-pay

## 2019-08-07 VITALS — BP 128/66 | HR 67 | Temp 98.3°F | Ht 62.0 in | Wt 228.6 lb

## 2019-08-07 DIAGNOSIS — I120 Hypertensive chronic kidney disease with stage 5 chronic kidney disease or end stage renal disease: Secondary | ICD-10-CM

## 2019-08-07 DIAGNOSIS — Z0000 Encounter for general adult medical examination without abnormal findings: Secondary | ICD-10-CM

## 2019-08-07 DIAGNOSIS — Z992 Dependence on renal dialysis: Secondary | ICD-10-CM

## 2019-08-07 DIAGNOSIS — N186 End stage renal disease: Secondary | ICD-10-CM | POA: Diagnosis not present

## 2019-08-07 DIAGNOSIS — N2581 Secondary hyperparathyroidism of renal origin: Secondary | ICD-10-CM | POA: Diagnosis not present

## 2019-08-07 DIAGNOSIS — E1122 Type 2 diabetes mellitus with diabetic chronic kidney disease: Secondary | ICD-10-CM | POA: Diagnosis not present

## 2019-08-07 DIAGNOSIS — I953 Hypotension of hemodialysis: Secondary | ICD-10-CM | POA: Diagnosis not present

## 2019-08-07 DIAGNOSIS — IMO0002 Reserved for concepts with insufficient information to code with codable children: Secondary | ICD-10-CM

## 2019-08-07 DIAGNOSIS — R55 Syncope and collapse: Secondary | ICD-10-CM

## 2019-08-07 DIAGNOSIS — E1165 Type 2 diabetes mellitus with hyperglycemia: Secondary | ICD-10-CM | POA: Diagnosis not present

## 2019-08-07 LAB — POCT UA - MICROALBUMIN
Creatinine, POC: 300 mg/dL
Microalbumin Ur, POC: 150 mg/L

## 2019-08-07 LAB — POCT URINALYSIS DIPSTICK
Glucose, UA: POSITIVE — AB
Leukocytes, UA: NEGATIVE
Nitrite, UA: NEGATIVE
Protein, UA: POSITIVE — AB
Spec Grav, UA: >=1.03 — AB (ref 1.010–1.025)
Urobilinogen, UA: 0.2 E.U./dL
pH, UA: 5.5 (ref 5.0–8.0)

## 2019-08-07 IMAGING — MG MM CLIP PLACEMENT
2 series · 2 of 2 positions shown · non-contrast
Comparison: Previous exam(s).

CLINICAL DATA: Status post ultrasound-guided core needle biopsy of
8 palpable ill-defined lesion in the upper right breast.

EXAM:
DIAGNOSTIC RIGHT MAMMOGRAM POST ULTRASOUND BIOPSY

[R CC]
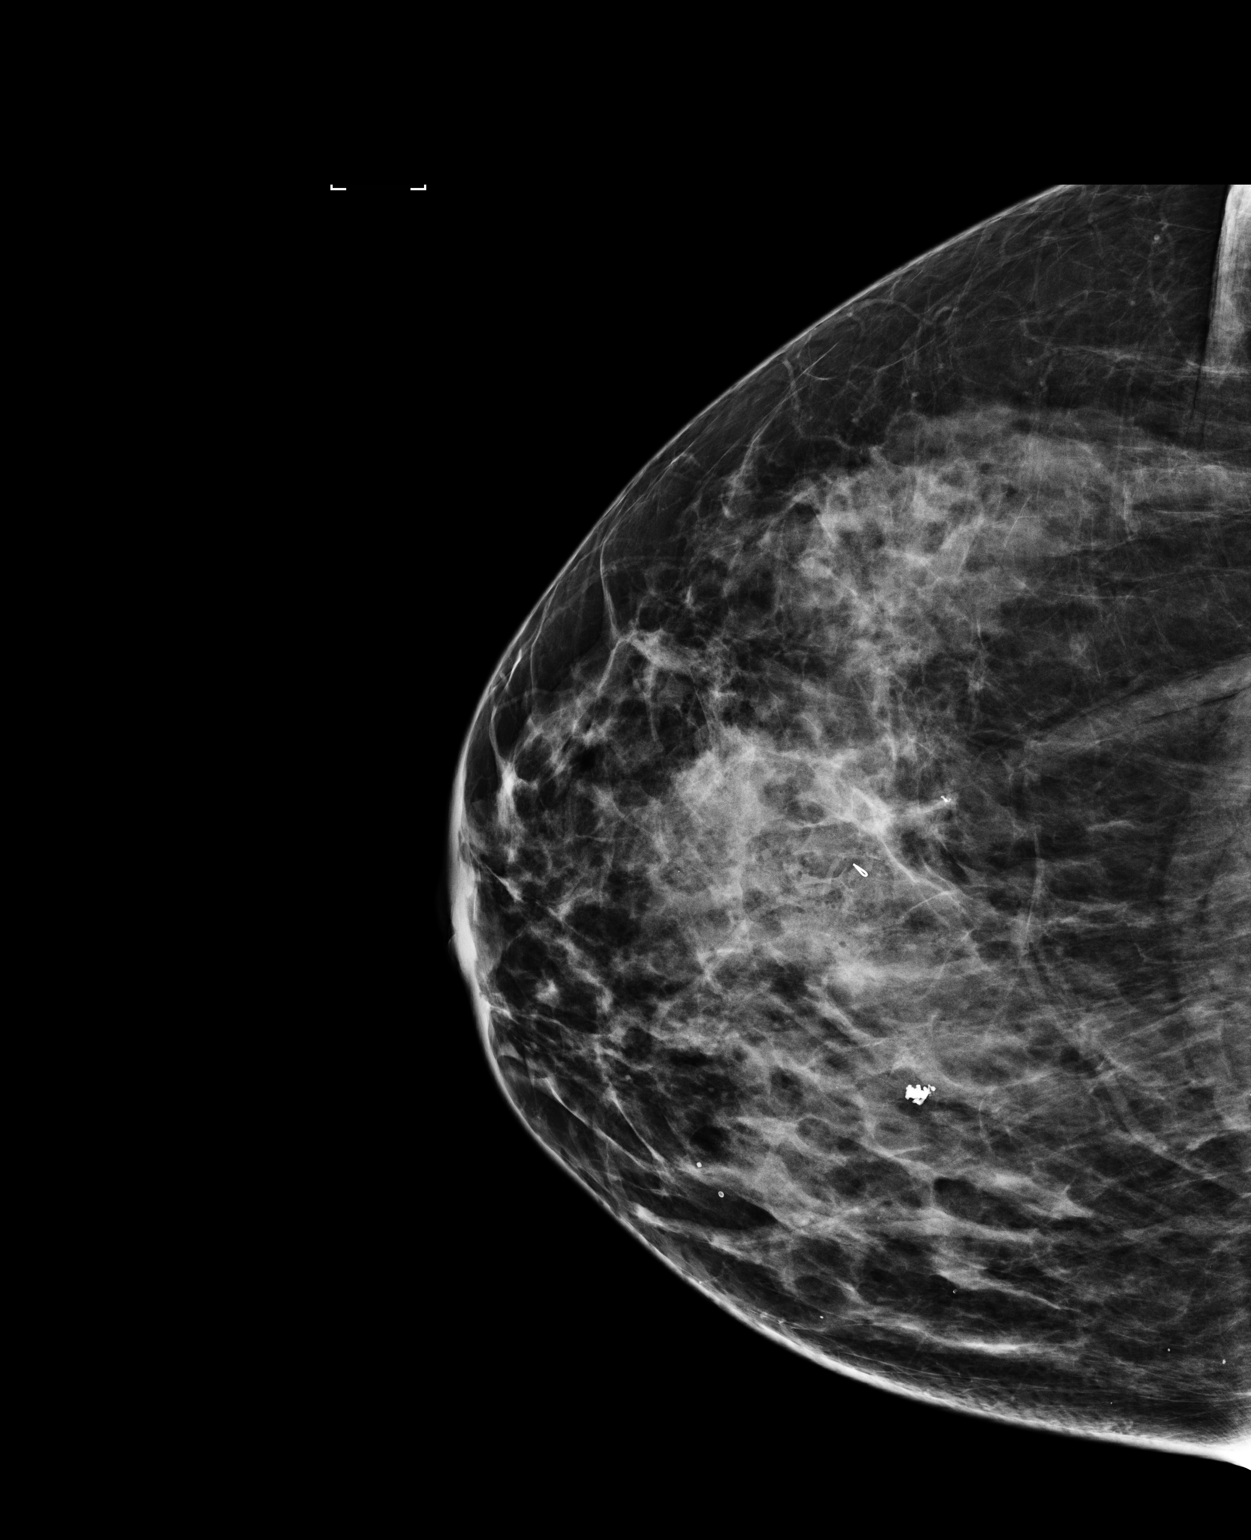

[R ML]
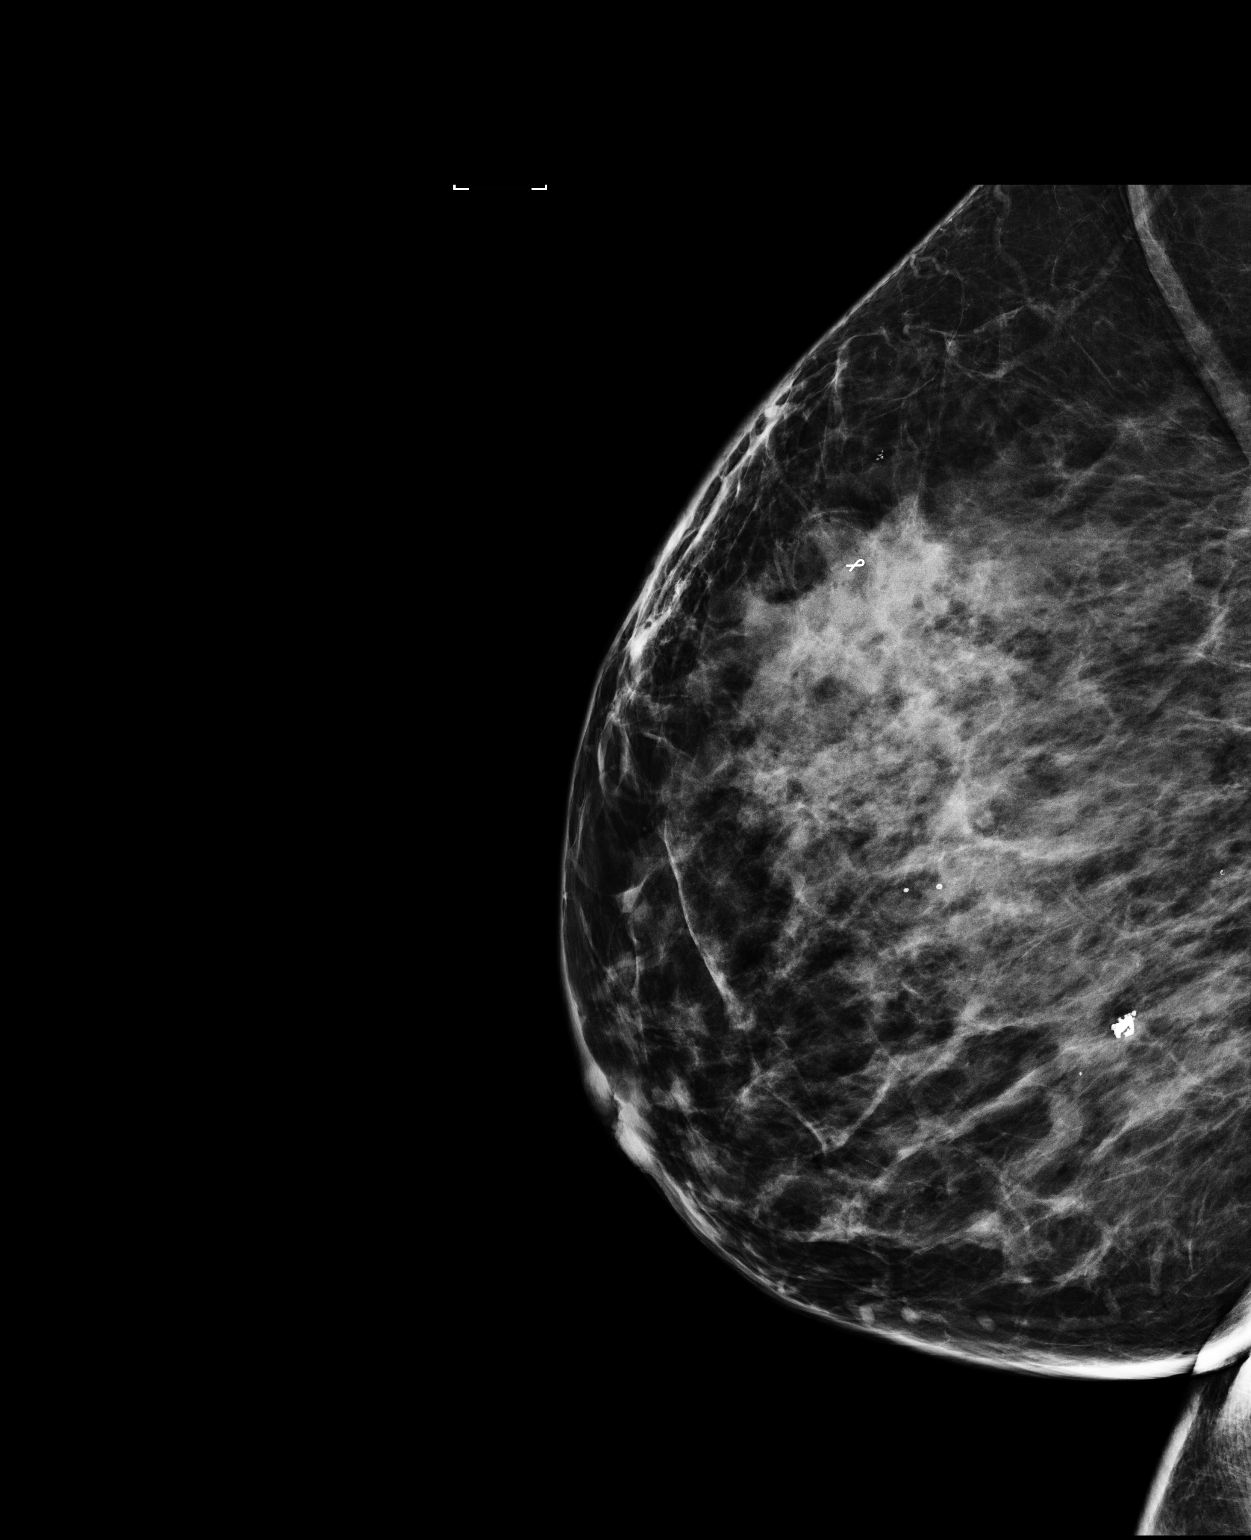

[2 of 2 positions shown; findings below may reference images not displayed]

FINDINGS: Mammographic images were obtained following ultrasound guided biopsy
of ill-defined right breast, 12:30 o'clock, mass. The ribbon shaped
biopsy clip lies in the upper aspect of the right breast consistent
with the expected location of the palpable and sonographic
abnormalities.
IMPRESSION: Well-positioned ribbon shaped biopsy clip following
ultrasound-guided core needle biopsy of a right breast mass.

Final Assessment: Post Procedure Mammograms for Marker Placement

## 2019-08-07 IMAGING — US US BREAST BX W LOC DEV 1ST LESION IMG BX SPEC US GUIDE*R*
1 series · 13 of 25 positions shown · non-contrast
Comparison: Previous exam(s).

ADDENDUM:
Pathology revealed FIBROCYSTIC CHANGES of the Right breast, 12:30
o'clock. THERE IS NO EVIDENCE OF CARCINOMA of the Right axillary
lymph node. This was found to be concordant by Dr. Seyi Alvez.
Pathology results were discussed with the patient by telephone. The
patient reported doing well after the biopsies with tenderness at
the sites. Post biopsy instructions and care were reviewed and
questions were answered. The patient was encouraged to call The
patient was asked to return for right diagnostic mammography and
ultrasound in 6 months and informed a reminder notice would be sent
regarding this appointment.

Pathology results reported by Arnaud Jean-Francois, RN on 02/14/2017.
CLINICAL DATA: Patient presents for ultrasound-guided core needle
biopsy of a right breast mass and an abnormal right axillary lymph
node.
EXAM:
ULTRASOUND GUIDED RIGHT BREAST CORE NEEDLE BIOPSY
ULTRASOUND GUIDED RIGHT AXILLARY LYMPH NODE CORE NEEDLE BIOPSY

[Series 1: us breast bx w loc dev 1st lesion img bx spec us g · 0.07mm/px · 13 of 25 slices shown]
[im 1/25]
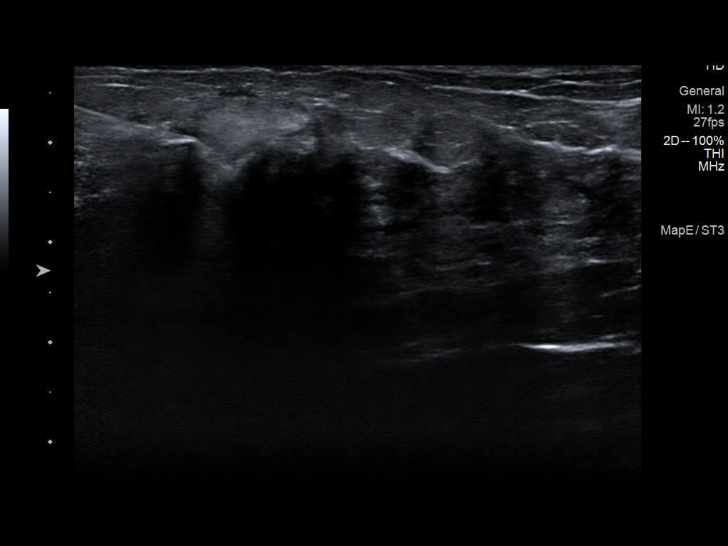
[im 3/25]
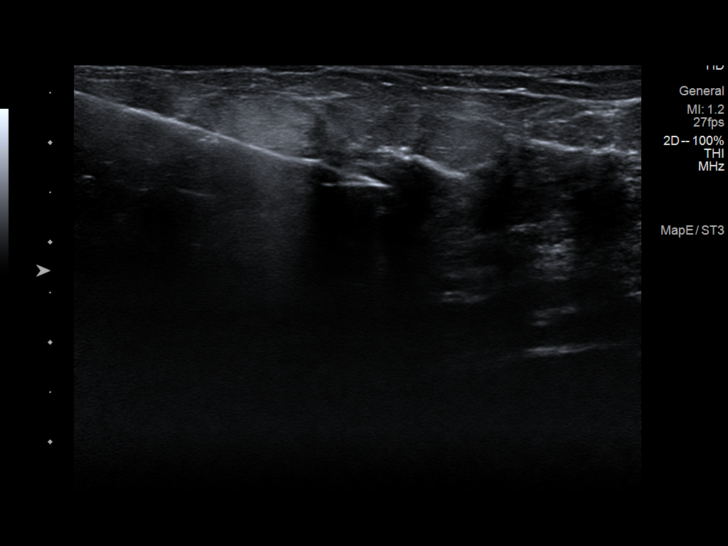
[im 5/25]
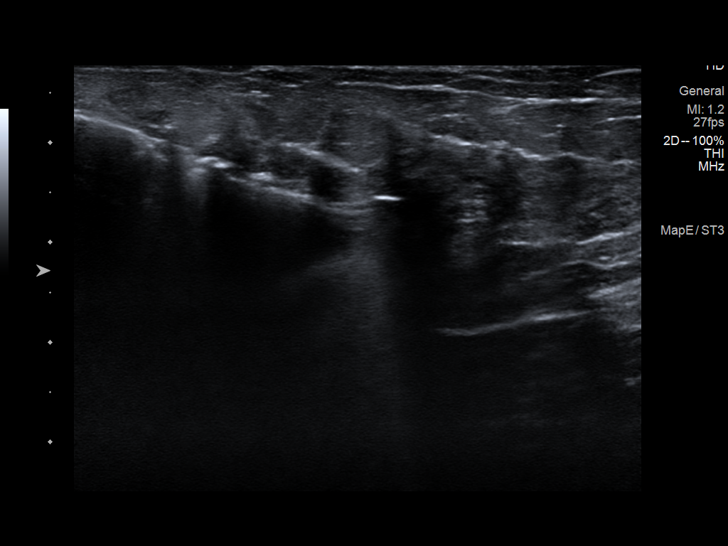
[im 7/25]
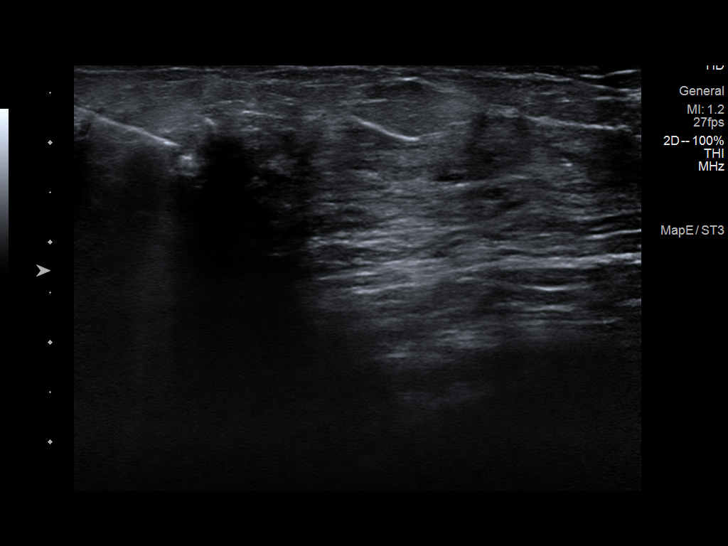
[im 9/25]
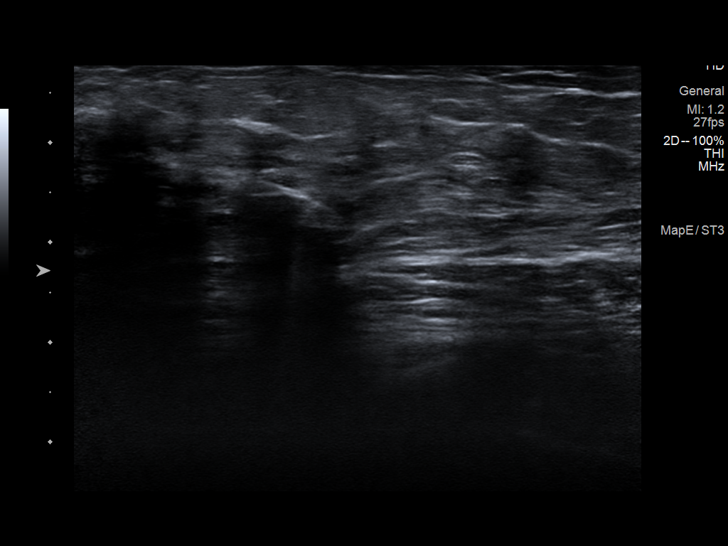
[im 11/25]
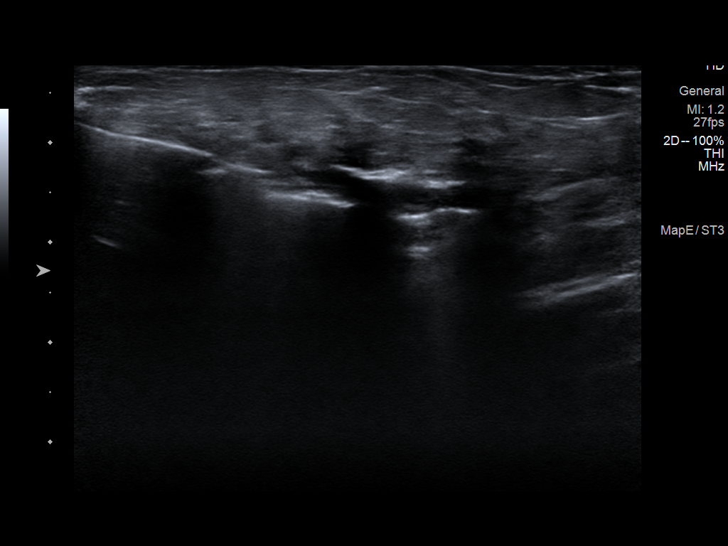
[im 13/25]
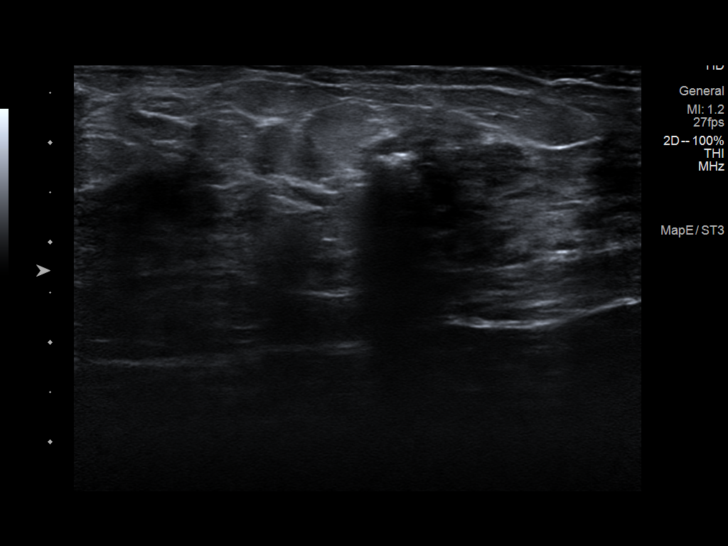
[im 15/25]
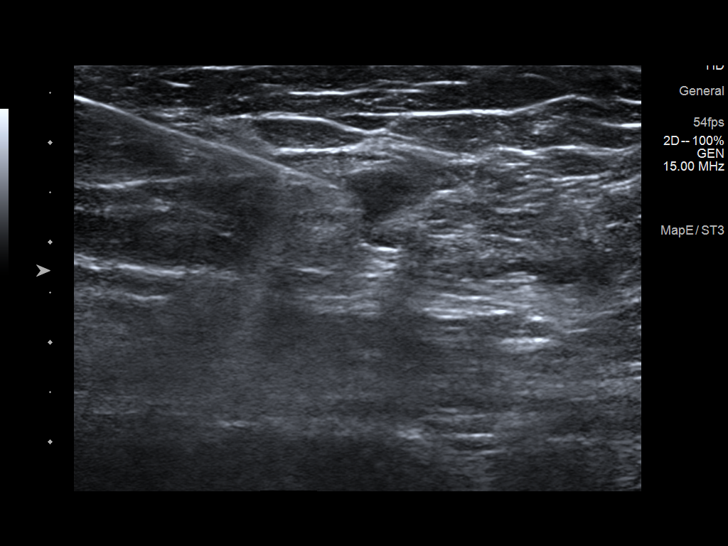
[im 17/25]
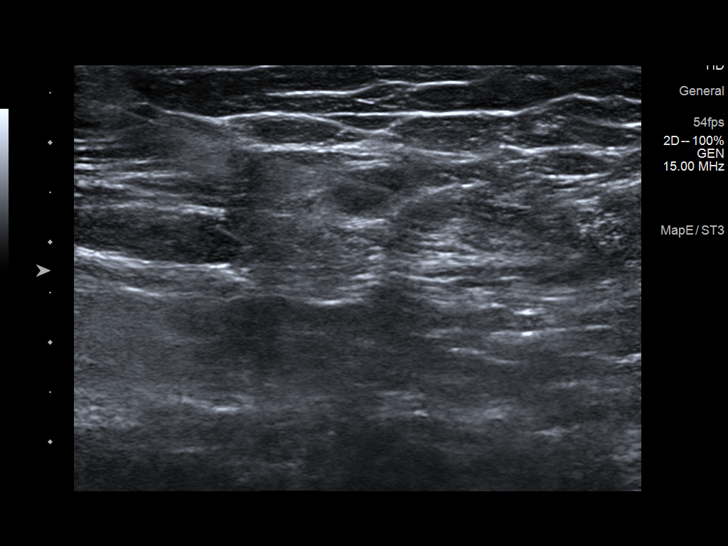
[im 19/25]
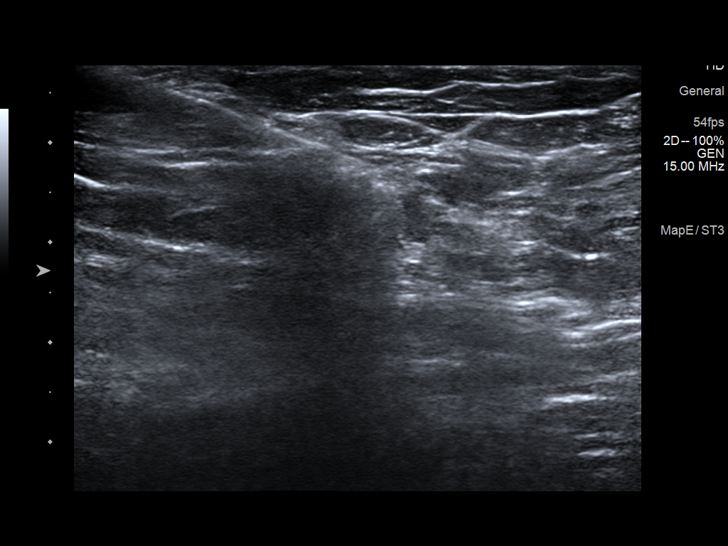
[im 21/25]
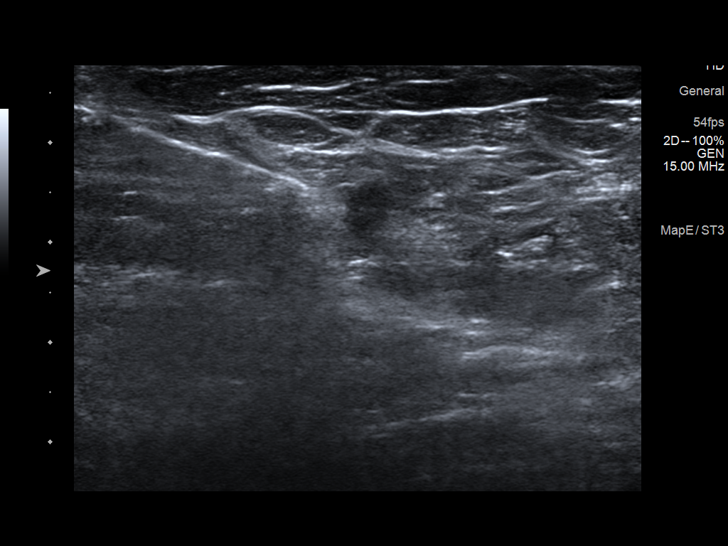
[im 23/25]
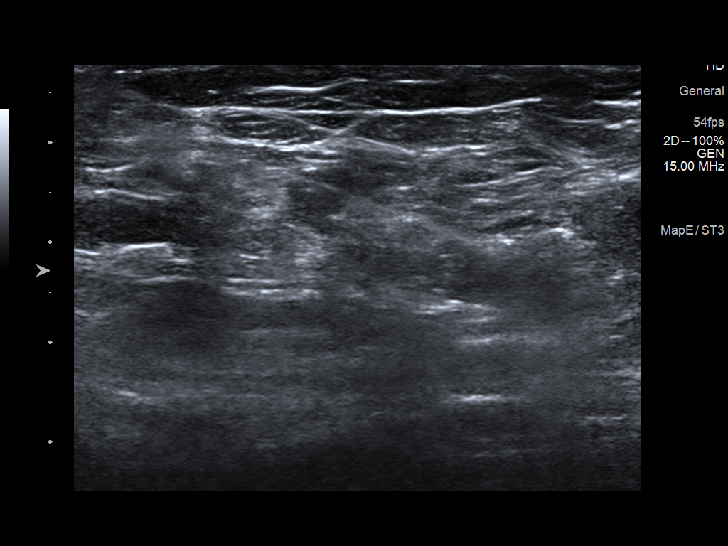
[im 25/25]
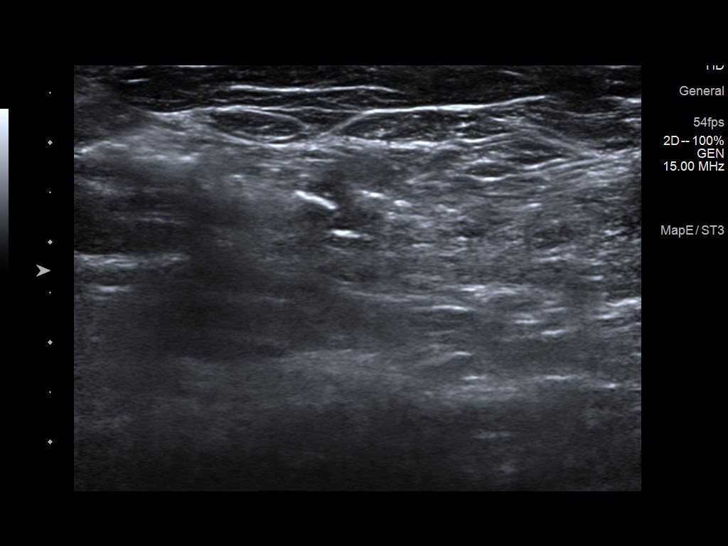

[13 of 25 positions shown; findings below may reference images not displayed]



Lesion quadrant: Upper inner quadrant. Breast lesion was initially
described as at the 11:30 o'clock position. This lesion was a
palpable lump reported by the patient. Since the lesion is poorly
defined, I had the patient locate the lump, which corresponded to a
focal hypoechoic ill-defined lesion closer to the 12:30 o'clock
position. This was biopsied.

Using sterile technique and 1% Lidocaine as local anesthetic, under
direct ultrasound visualization, a 12 gauge Pura device was
used to perform biopsy of the ill-defined 12/1810 o'clock left
breast lesion using a lateral approach. At the conclusion of the
procedure a ribbon shaped tissue marker clip was deployed into the
biopsy cavity.

Using sterile technique and 1% Lidocaine as local anesthetic, under
direct ultrasound visualization, a 14 gauge Pura device was
used to perform biopsy of the neck and right axillary lymph using a
inferior, lateral approach. At the conclusion of the procedure a
HydroMARK tissue marker clip was deployed into the biopsy cavity.

Follow up 2 view mammogram was performed and dictated separately.
IMPRESSION: Ultrasound guided biopsy of a right breast lesion and right axillary
lymph node. No apparent complications.

## 2019-08-07 NOTE — Progress Notes (Signed)
This visit occurred during the SARS-CoV-2 public health emergency.  Safety protocols were in place, including screening questions prior to the visit, additional usage of staff PPE, and extensive cleaning of exam room while observing appropriate contact time as indicated for disinfecting solutions.  Subjective:     Patient ID: Cassandra Campos , female    DOB: [birth year] , 49 y.o.   MRN: [id number]   Chief Complaint  Patient presents with  . Annual Exam  . Diabetes  . Hypertension    HPI  She is here today for a full physical examination. She is followed by Dr. Phineas Real for her GYN exams. She feels fairly well. Since her last visit, she had episode of syncope during home hemodialysis on 3/2. She reports she didn't feel too well prior to dialysis. She denies having fever/chills. But admits that she felt a bit nauseated and weak prior to HD. She states she passed out and vomited. Her husband called EMS and she was taken to ER. ER workup neg, she improved after fluid bolus. She has not had any similar sx since then.   Diabetes She presents for her follow-up diabetic visit. She has type 2 diabetes mellitus. Her disease course has been stable. There are no hypoglycemic associated symptoms. Pertinent negatives for diabetes include no blurred vision and no chest pain. There are no hypoglycemic complications. Diabetic complications include nephropathy, peripheral neuropathy and retinopathy. Risk factors for coronary artery disease include dyslipidemia, hypertension, diabetes mellitus, post-menopausal, sedentary lifestyle and obesity. She is compliant with treatment some of the time. She is following a diabetic diet. She has not had a previous visit with a dietitian. She participates in exercise intermittently. Her home blood glucose trend is fluctuating minimally. Her breakfast blood glucose is taken between 9-10 am. Her breakfast blood glucose range is generally 140-180 mg/dl. Eye exam is current.   Hypertension This is a chronic problem. The current episode started more than 1 year ago. The problem has been gradually improving since onset. The problem is controlled. Pertinent negatives include no blurred vision, chest pain or shortness of breath. Palpitations: she c/o palpitations. recurrent. no associated chest pain. random. occurs at rest.  The current treatment provides moderate improvement. Hypertensive end-organ damage includes retinopathy. Identifiable causes of hypertension include renovascular disease.     Past Medical History:  Diagnosis Date  . Anemia associated with chronic renal failure   . ASCUS of cervix with negative high risk HPV 06/2017  . CKD (chronic kidney disease), stage IV ([place]) no dialysis yet   nephrologist-  dr Jamal Maes-- scheduled next visit 09-08-2017 (lov 07-05-2017)  . Diabetic retinopathy ([place])   . Dialysis patient ([place])   . Elevated transaminase level   . Fatty liver    FOLLOWED BY DR Henrene Pastor  . Hypertension   . Idiopathic gout of multiple sites    09-05-2017 per pt stable  . Insulin dependent type 2 diabetes mellitus ([place])    followed by dr [facility] Care  . Mixed hyperlipidemia   . OSA (obstructive sleep apnea)    per study 10-20-2015 mild osa w/ AHI 10.3/hr;  09-05-2017 per pt has not used cpap since 1 yr ago  . Peripheral neuropathy    feet  . S/P arteriovenous (AV) fistula creation 07-04-2015  w/ revision 02-14-2017   left braniocephilic  . Trigger thumb, right thumb   . Umbilical hernia      Family History  Problem Relation Age of Onset  . Diabetes Mother   . Hypertension Mother   .  Diabetes Father   . Hypertension Father   . Cancer Father        STOMACH  . Stomach cancer Father   . Stroke Maternal Grandmother   . Stroke Maternal Grandfather   . Colon cancer Neg Hx   . Rectal cancer Neg Hx      Current Outpatient Medications:  .  allopurinol (ZYLOPRIM) 300 MG tablet, Take 1 tablet (300 mg total) by mouth every morning., Disp: 90  tablet, Rfl: 1 .  B Complex-C-Folic Acid (RENAL) 1 MG CAPS, TAKE 1 CAPSULE BY MOUTH DAILY (ON DIALYSIS DAYS, TAKE AFTER DIALYSIS TREATMENT ), Disp: , Rfl:  .  BELSOMRA 15 MG TABS, Take 1 tablet by mouth at bedtime., Disp: , Rfl:  .  benzonatate (TESSALON PERLES) 100 MG capsule, Take 1 capsule (100 mg total) by mouth 3 (three) times daily as needed for cough., Disp: 30 capsule, Rfl: 1 .  calcitRIOL (ROCALTROL) 0.5 MCG capsule, Take 0.5 mcg by mouth daily., Disp: , Rfl:  .  Chlorphen-PE-Acetaminophen (NOREL AD) 4-10-325 MG TABS, Take 4-10 mg by mouth 2 (two) times daily as needed., Disp: 20 tablet, Rfl: 0 .  cinacalcet (SENSIPAR) 60 MG tablet, Take 60 mg by mouth daily. Every other day, Disp: , Rfl:  .  cromolyn (OPTICROM) 4 % ophthalmic solution, 1 drop 4 (four) times daily., Disp: , Rfl:  .  EASY COMFORT PEN NEEDLES 31G X 8 MM MISC, INJECT TWICE A DAY AS DIRECTED, Disp: , Rfl: 3 .  HUMULIN R 500 UNIT/ML injection, 180 units in the morning and 140 units in the evening before meals (Patient taking differently: 170 units in the morning and 130 units in the evening before meals), Disp: 20 mL, Rfl: 11 .  lanthanum (FOSRENOL) 1000 MG chewable tablet, Chew 1,000 mg by mouth 3 (three) times daily with meals., Disp: , Rfl:  .  lidocaine-prilocaine (EMLA) cream, USE ON ARAMS AS NEEDED FOR DIALYSIS, Disp: , Rfl: 10 .  Microlet Lancets MISC, Use as directed to check blood sugars 4 times per day dx: e11.22, Disp: 300 each, Rfl: 2 .  MITIGARE 0.6 MG CAPS, TAKE ONE CAPSULE BY MOUTH ONCE DAILY, Disp: 90 capsule, Rfl: 0 .  ondansetron (ZOFRAN) 4 MG tablet, Take 1 tablet (4 mg total) by mouth every 8 (eight) hours as needed for nausea or vomiting., Disp: 10 tablet, Rfl: 0 .  OZEMPIC, 0.25 OR 0.5 MG/DOSE, 2 MG/1.5ML SOPN, INJECT 0.5 MG INTO THE SKIN ONCE A WEEK., Disp: 1.5 mL, Rfl: 3 .  pregabalin (LYRICA) 100 MG capsule, TAKE 1 CAPSULE BY MOUTH IN THE MORNING FOR NEUROPATHY, Disp: 90 capsule, Rfl: 1 .  pregabalin  (LYRICA) 75 MG capsule, Take one capsule po qam, Disp: 90 capsule, Rfl: 0 .  RESTASIS 0.05 % ophthalmic emulsion, INSTILL 1 DROP INTO BOTH EYES TWICE A DAY, Disp: , Rfl: 3 .  rosuvastatin (CRESTOR) 20 MG tablet, Take 1 tablet (20 mg total) by mouth daily., Disp: 90 tablet, Rfl: 3 .  neomycin-polymyxin-hydrocortisone (CORTISPORIN) OTIC solution, Apply 1-2 drops to the toe after soaking toe twice a day (Patient not taking: Reported on 07/24/2019), Disp: 10 mL, Rfl: 0   No Known Allergies    The patient states she uses none for birth control. Last LMP was No LMP recorded. Patient has had a hysterectomy.. Negative for Dysmenorrhea Negative for: breast discharge, breast lump(s), breast pain and breast self exam. Associated symptoms include abnormal vaginal bleeding. Pertinent negatives include abnormal bleeding (hematology), anxiety, decreased libido, depression,  difficulty falling sleep, dyspareunia, history of infertility, nocturia, sexual dysfunction, sleep disturbances, urinary incontinence, urinary urgency, vaginal discharge and vaginal itching. Diet regular.The patient states her exercise level is  intermittent.  . The patient's tobacco use is:  Social History   Tobacco Use  Smoking Status Never Smoker  Smokeless Tobacco Never Used  . She has been exposed to passive smoke. The patient's alcohol use is:  Social History   Substance and Sexual Activity  Alcohol Use Never  . Alcohol/week: 0.0 standard drinks    Review of Systems  HENT: Negative.   Eyes: Negative.  Negative for blurred vision.  Respiratory: Negative.  Negative for shortness of breath.   Cardiovascular: Negative for chest pain. Palpitations: she c/o palpitations. recurrent. no associated chest pain. random. occurs at rest.   Endocrine: Negative.   Genitourinary: Negative.   Musculoskeletal: Negative.   Skin: Negative.   Allergic/Immunologic: Negative.   Neurological: Negative.   Hematological: Negative.  Adenopathy:    Psychiatric/Behavioral: Negative.      Today's Vitals   08/07/19 1506  BP: 128/66  Pulse: 67  Temp: 98.3 F (36.8 C)  TempSrc: Oral  Weight: 228 lb 9.6 oz (103.7 kg)  Height: 5\' 2"  (1.575 m)   Body mass index is 41.81 kg/m.   Wt Readings from Last 3 Encounters:  08/07/19 228 lb 9.6 oz (103.7 kg)  07/24/19 230 lb (104.3 kg)  07/04/19 232 lb 6.4 oz (105.4 kg)     Objective:  Physical Exam Vitals and nursing note reviewed.  Constitutional:      Appearance: Normal appearance. She is obese.  HENT:     Head: Normocephalic and atraumatic.     Right Ear: Tympanic membrane, ear canal and external ear normal.     Left Ear: Tympanic membrane, ear canal and external ear normal.     Nose:     Comments: Deferred, masked    Mouth/Throat:     Comments: Deferred, masked Eyes:     Extraocular Movements: Extraocular movements intact.     Conjunctiva/sclera: Conjunctivae normal.     Pupils: Pupils are equal, round, and reactive to light.  Cardiovascular:     Rate and Rhythm: Normal rate and regular rhythm.     Pulses:          Dorsalis pedis pulses are 1+ on the right side and 1+ on the left side.     Heart sounds: Normal heart sounds.  Pulmonary:     Effort: Pulmonary effort is normal.     Breath sounds: Normal breath sounds.  Abdominal:     General: Bowel sounds are normal.     Palpations: Abdomen is soft.     Comments: Obese, soft  Genitourinary:    Comments: deferred Musculoskeletal:        General: Normal range of motion.     Cervical back: Normal range of motion and neck supple.  Feet:     Right foot:     Protective Sensation: 5 sites tested. 5 sites sensed.     Skin integrity: Dry skin present.     Toenail Condition: Right toenails are normal.     Left foot:     Protective Sensation: 5 sites tested. 5 sites sensed.     Skin integrity: Dry skin present.     Toenail Condition: Left toenails are normal.  Skin:    General: Skin is warm and dry.  Neurological:      General: No focal deficit present.     Mental  Status: She is alert and oriented to person, place, and time.  Psychiatric:        Mood and Affect: Mood normal.        Behavior: Behavior normal.         Assessment And Plan:     1. Routine general medical examination at health care facility  A full exam was performed.  Importance of monthly self breast exams was discussed with the patient.  PATIENT IS ADVISED TO GET 30-45 MINUTES REGULAR EXERCISE NO LESS THAN FOUR TO FIVE DAYS PER WEEK - BOTH WEIGHTBEARING EXERCISES AND AEROBIC ARE RECOMMENDED.  SHE IS ADVISED TO FOLLOW A HEALTHY DIET WITH AT LEAST SIX FRUITS/VEGGIES PER DAY, DECREASE INTAKE OF RED MEAT, AND TO INCREASE FISH INTAKE TO TWO DAYS PER WEEK.  MEATS/FISH SHOULD NOT BE FRIED, BAKED OR BROILED IS PREFERABLE.  I SUGGEST WEARING SPF 50 SUNSCREEN ON EXPOSED PARTS AND ESPECIALLY WHEN IN THE DIRECT SUNLIGHT FOR AN EXTENDED PERIOD OF TIME.  PLEASE AVOID FAST FOOD RESTAURANTS AND INCREASE YOUR WATER INTAKE.   2. 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 I will also refer her to Endocrinology for further diabetes management. She is awaiting renal transplant; therefore, it is imperative that we get her sugars under better control.  Importance of dietary and medication compliance was discussed with the patient. She agrees to treatment plan.  I DISCUSSED WITH THE PATIENT AT LENGTH REGARDING THE GOALS OF GLYCEMIC CONTROL AND POSSIBLE LONG-TERM COMPLICATIONS.  I  ALSO STRESSED THE IMPORTANCE OF COMPLIANCE WITH HOME GLUCOSE MONITORING, DIETARY RESTRICTIONS INCLUDING AVOIDANCE OF SUGARY DRINKS/PROCESSED FOODS,  ALONG WITH REGULAR EXERCISE.  I  ALSO STRESSED THE IMPORTANCE OF ANNUAL EYE EXAMS, SELF FOOT CARE AND COMPLIANCE WITH OFFICE VISITS.  - POCT Urinalysis Dipstick (81002) - POCT UA - Microalbumin  3. 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 She will continue with current meds. She is encouraged to avoid adding salt to her foods. EKG performed, ST w/ HR 101, nonspecific T abnormality, low voltage.   - EKG 12-Lead  4. Dependence on renal dialysis ([place])  5. Syncope, unspecified syncope type  ER records were reviewed in detail during her visit. She is encouraged to stay well hydrated and to notify nephrologist with any future issues re: HD.    Maximino Greenland, MD    THE PATIENT IS ENCOURAGED TO PRACTICE SOCIAL DISTANCING DUE TO THE COVID-19 PANDEMIC.

## 2019-08-07 NOTE — Patient Instructions (Signed)
Please call [facility] regarding CPAP    Health Maintenance, Female Adopting a healthy lifestyle and getting preventive care are important in promoting health and wellness. Ask your health care provider about:  The right schedule for you to have regular tests and exams.  Things you can do on your own to prevent diseases and keep yourself healthy. What should I know about diet, weight, and exercise? Eat a healthy diet   Eat a diet that includes plenty of vegetables, fruits, low-fat dairy products, and lean protein.  Do not eat a lot of foods that are high in solid fats, added sugars, or sodium. Maintain a healthy weight Body mass index (BMI) is used to identify weight problems. It estimates body fat based on height and weight. Your health care provider can help determine your BMI and help you achieve or maintain a healthy weight. Get regular exercise Get regular exercise. This is one of the most important things you can do for your health. Most adults should:  Exercise for at least 150 minutes each week. The exercise should increase your heart rate and make you sweat (moderate-intensity exercise).  Do strengthening exercises at least twice a week. This is in addition to the moderate-intensity exercise.  Spend less time sitting. Even light physical activity can be beneficial. Watch cholesterol and blood lipids Have your blood tested for lipids and cholesterol at 48 years of age, then have this test every 5 years. Have your cholesterol levels checked more often if:  Your lipid or cholesterol levels are high.  You are older than 48 years of age.  You are at high risk for heart disease. What should I know about cancer screening? Depending on your health history and family history, you may need to have cancer screening at various ages. This may include screening for:  Breast cancer.  Cervical cancer.  Colorectal cancer.  Skin cancer.  Lung cancer. What should I know about  heart disease, diabetes, and high blood pressure? Blood pressure and heart disease  High blood pressure causes heart disease and increases the risk of stroke. This is more likely to develop in people who have high blood pressure readings, are of African descent, or are overweight.  Have your blood pressure checked: ? Every 3-5 years if you are 35-48 years of age. ? Every year if you are 48 years old or older. Diabetes Have regular diabetes screenings. This checks your fasting blood sugar level. Have the screening done:  Once every three years after age 34 if you are at a normal weight and have a low risk for diabetes.  More often and at a younger age if you are overweight or have a high risk for diabetes. What should I know about preventing infection? Hepatitis B If you have a higher risk for hepatitis B, you should be screened for this virus. Talk with your health care provider to find out if you are at risk for hepatitis B infection. Hepatitis C Testing is recommended for:  Everyone born from [birth year] through 1964.  Anyone with known risk factors for hepatitis C. Sexually transmitted infections (STIs)  Get screened for STIs, including gonorrhea and chlamydia, if: ? You are sexually active and are younger than 48 years of age. ? You are older than 48 years of age and your health care provider tells you that you are at risk for this type of infection. ? Your sexual activity has changed since you were last screened, and you are at increased risk for chlamydia  or gonorrhea. Ask your health care provider if you are at risk.  Ask your health care provider about whether you are at high risk for HIV. Your health care provider may recommend a prescription medicine to help prevent HIV infection. If you choose to take medicine to prevent HIV, you should first get tested for HIV. You should then be tested every 3 months for as long as you are taking the medicine. Pregnancy  If you are about to  stop having your period (premenopausal) and you may become pregnant, seek counseling before you get pregnant.  Take 48 to 800 micrograms (mcg) of folic acid every day if you become pregnant.  Ask for birth control (contraception) if you want to prevent pregnancy. Osteoporosis and menopause Osteoporosis is a disease in which the bones lose minerals and strength with aging. This can result in bone fractures. If you are 65 years old or older, or if you are at risk for osteoporosis and fractures, ask your health care provider if you should:  Be screened for bone loss.  Take a calcium or vitamin D supplement to lower your risk of fractures.  Be given hormone replacement therapy (HRT) to treat symptoms of menopause. Follow these instructions at home: Lifestyle  Do not use any products that contain nicotine or tobacco, such as cigarettes, e-cigarettes, and chewing tobacco. If you need help quitting, ask your health care provider.  Do not use street drugs.  Do not share needles.  Ask your health care provider for help if you need support or information about quitting drugs. Alcohol use  Do not drink alcohol if: ? Your health care provider tells you not to drink. ? You are pregnant, may be pregnant, or are planning to become pregnant.  If you drink alcohol: ? Limit how much you use to 0-1 drink a day. ? Limit intake if you are breastfeeding.  Be aware of how much alcohol is in your drink. In the [place] one drink equals one 12 oz bottle of beer (355 mL), one 5 oz glass of wine (148 mL), or one 1 oz glass of hard liquor (44 mL). General instructions  Schedule regular health, dental, and eye exams.  Stay current with your vaccines.  Tell your health care provider if: ? You often feel depressed. ? You have ever been abused or do not feel safe at home. Summary  Adopting a healthy lifestyle and getting preventive care are important in promoting health and wellness.  Follow your  health care provider's instructions about healthy diet, exercising, and getting tested or screened for diseases.  Follow your health care provider's instructions on monitoring your cholesterol and blood pressure. This information is not intended to replace advice given to you by your health care provider. Make sure you discuss any questions you have with your health care provider. Document Revised: 04/26/2018 Document Reviewed: 04/26/2018 [employer]vier Patient Education  2020 [employer]  

## 2019-08-08 LAB — LIPID PANEL
Chol/HDL Ratio: 3.9 ratio (ref 0.0–4.4)
Cholesterol, Total: 192 mg/dL (ref 100–199)
HDL: 49 mg/dL (ref >39)
LDL Chol Calc (NIH): 96 mg/dL (ref 0–99)
Triglycerides: 280 mg/dL — ABNORMAL HIGH (ref 0–149)
VLDL Cholesterol Cal: 47 mg/dL — ABNORMAL HIGH (ref 5–40)

## 2019-08-09 ENCOUNTER — Encounter: Payer: Self-pay | Admitting: Neurology

## 2019-08-09 DIAGNOSIS — Z992 Dependence on renal dialysis: Secondary | ICD-10-CM | POA: Diagnosis not present

## 2019-08-09 DIAGNOSIS — I953 Hypotension of hemodialysis: Secondary | ICD-10-CM | POA: Diagnosis not present

## 2019-08-09 DIAGNOSIS — N186 End stage renal disease: Secondary | ICD-10-CM | POA: Diagnosis not present

## 2019-08-09 DIAGNOSIS — N2581 Secondary hyperparathyroidism of renal origin: Secondary | ICD-10-CM | POA: Diagnosis not present

## 2019-08-10 DIAGNOSIS — N186 End stage renal disease: Secondary | ICD-10-CM | POA: Diagnosis not present

## 2019-08-10 DIAGNOSIS — N2581 Secondary hyperparathyroidism of renal origin: Secondary | ICD-10-CM | POA: Diagnosis not present

## 2019-08-10 DIAGNOSIS — I953 Hypotension of hemodialysis: Secondary | ICD-10-CM | POA: Diagnosis not present

## 2019-08-10 DIAGNOSIS — Z992 Dependence on renal dialysis: Secondary | ICD-10-CM | POA: Diagnosis not present

## 2019-08-13 DIAGNOSIS — N186 End stage renal disease: Secondary | ICD-10-CM | POA: Diagnosis not present

## 2019-08-13 DIAGNOSIS — I953 Hypotension of hemodialysis: Secondary | ICD-10-CM | POA: Diagnosis not present

## 2019-08-13 DIAGNOSIS — Z992 Dependence on renal dialysis: Secondary | ICD-10-CM | POA: Diagnosis not present

## 2019-08-13 DIAGNOSIS — N2581 Secondary hyperparathyroidism of renal origin: Secondary | ICD-10-CM | POA: Diagnosis not present

## 2019-08-14 DIAGNOSIS — N186 End stage renal disease: Secondary | ICD-10-CM | POA: Diagnosis not present

## 2019-08-14 DIAGNOSIS — N2581 Secondary hyperparathyroidism of renal origin: Secondary | ICD-10-CM | POA: Diagnosis not present

## 2019-08-14 DIAGNOSIS — I953 Hypotension of hemodialysis: Secondary | ICD-10-CM | POA: Diagnosis not present

## 2019-08-14 DIAGNOSIS — Z992 Dependence on renal dialysis: Secondary | ICD-10-CM | POA: Diagnosis not present

## 2019-08-15 DIAGNOSIS — Z992 Dependence on renal dialysis: Secondary | ICD-10-CM | POA: Diagnosis not present

## 2019-08-15 DIAGNOSIS — N186 End stage renal disease: Secondary | ICD-10-CM | POA: Diagnosis not present

## 2019-08-15 DIAGNOSIS — E1122 Type 2 diabetes mellitus with diabetic chronic kidney disease: Secondary | ICD-10-CM | POA: Diagnosis not present

## 2019-08-16 DIAGNOSIS — N186 End stage renal disease: Secondary | ICD-10-CM | POA: Diagnosis not present

## 2019-08-16 DIAGNOSIS — I953 Hypotension of hemodialysis: Secondary | ICD-10-CM | POA: Diagnosis not present

## 2019-08-16 DIAGNOSIS — N2581 Secondary hyperparathyroidism of renal origin: Secondary | ICD-10-CM | POA: Diagnosis not present

## 2019-08-16 DIAGNOSIS — Z992 Dependence on renal dialysis: Secondary | ICD-10-CM | POA: Diagnosis not present

## 2019-08-17 DIAGNOSIS — N2581 Secondary hyperparathyroidism of renal origin: Secondary | ICD-10-CM | POA: Diagnosis not present

## 2019-08-17 DIAGNOSIS — Z992 Dependence on renal dialysis: Secondary | ICD-10-CM | POA: Diagnosis not present

## 2019-08-17 DIAGNOSIS — N186 End stage renal disease: Secondary | ICD-10-CM | POA: Diagnosis not present

## 2019-08-17 DIAGNOSIS — I953 Hypotension of hemodialysis: Secondary | ICD-10-CM | POA: Diagnosis not present

## 2019-08-20 DIAGNOSIS — N2581 Secondary hyperparathyroidism of renal origin: Secondary | ICD-10-CM | POA: Diagnosis not present

## 2019-08-20 DIAGNOSIS — N186 End stage renal disease: Secondary | ICD-10-CM | POA: Diagnosis not present

## 2019-08-20 DIAGNOSIS — Z7682 Awaiting organ transplant status: Secondary | ICD-10-CM | POA: Diagnosis not present

## 2019-08-20 DIAGNOSIS — I953 Hypotension of hemodialysis: Secondary | ICD-10-CM | POA: Diagnosis not present

## 2019-08-20 DIAGNOSIS — Z992 Dependence on renal dialysis: Secondary | ICD-10-CM | POA: Diagnosis not present

## 2019-08-21 DIAGNOSIS — G4733 Obstructive sleep apnea (adult) (pediatric): Secondary | ICD-10-CM | POA: Diagnosis not present

## 2019-08-21 DIAGNOSIS — N186 End stage renal disease: Secondary | ICD-10-CM | POA: Diagnosis not present

## 2019-08-21 DIAGNOSIS — Z7682 Awaiting organ transplant status: Secondary | ICD-10-CM | POA: Diagnosis not present

## 2019-08-21 DIAGNOSIS — I953 Hypotension of hemodialysis: Secondary | ICD-10-CM | POA: Diagnosis not present

## 2019-08-21 DIAGNOSIS — Z992 Dependence on renal dialysis: Secondary | ICD-10-CM | POA: Diagnosis not present

## 2019-08-21 DIAGNOSIS — N2581 Secondary hyperparathyroidism of renal origin: Secondary | ICD-10-CM | POA: Diagnosis not present

## 2019-08-22 ENCOUNTER — Other Ambulatory Visit: Payer: Self-pay | Admitting: Internal Medicine

## 2019-08-23 DIAGNOSIS — Z23 Encounter for immunization: Secondary | ICD-10-CM | POA: Diagnosis not present

## 2019-08-23 DIAGNOSIS — N186 End stage renal disease: Secondary | ICD-10-CM | POA: Diagnosis not present

## 2019-08-23 DIAGNOSIS — I953 Hypotension of hemodialysis: Secondary | ICD-10-CM | POA: Diagnosis not present

## 2019-08-23 DIAGNOSIS — Z992 Dependence on renal dialysis: Secondary | ICD-10-CM | POA: Diagnosis not present

## 2019-08-23 DIAGNOSIS — N2581 Secondary hyperparathyroidism of renal origin: Secondary | ICD-10-CM | POA: Diagnosis not present

## 2019-08-24 DIAGNOSIS — N186 End stage renal disease: Secondary | ICD-10-CM | POA: Diagnosis not present

## 2019-08-24 DIAGNOSIS — I953 Hypotension of hemodialysis: Secondary | ICD-10-CM | POA: Diagnosis not present

## 2019-08-24 DIAGNOSIS — N2581 Secondary hyperparathyroidism of renal origin: Secondary | ICD-10-CM | POA: Diagnosis not present

## 2019-08-24 DIAGNOSIS — Z992 Dependence on renal dialysis: Secondary | ICD-10-CM | POA: Diagnosis not present

## 2019-08-27 DIAGNOSIS — I953 Hypotension of hemodialysis: Secondary | ICD-10-CM | POA: Diagnosis not present

## 2019-08-27 DIAGNOSIS — Z992 Dependence on renal dialysis: Secondary | ICD-10-CM | POA: Diagnosis not present

## 2019-08-27 DIAGNOSIS — N2581 Secondary hyperparathyroidism of renal origin: Secondary | ICD-10-CM | POA: Diagnosis not present

## 2019-08-27 DIAGNOSIS — N186 End stage renal disease: Secondary | ICD-10-CM | POA: Diagnosis not present

## 2019-08-28 DIAGNOSIS — N186 End stage renal disease: Secondary | ICD-10-CM | POA: Diagnosis not present

## 2019-08-28 DIAGNOSIS — N2581 Secondary hyperparathyroidism of renal origin: Secondary | ICD-10-CM | POA: Diagnosis not present

## 2019-08-28 DIAGNOSIS — Z992 Dependence on renal dialysis: Secondary | ICD-10-CM | POA: Diagnosis not present

## 2019-08-28 DIAGNOSIS — I953 Hypotension of hemodialysis: Secondary | ICD-10-CM | POA: Diagnosis not present

## 2019-08-30 ENCOUNTER — Ambulatory Visit (INDEPENDENT_AMBULATORY_CARE_PROVIDER_SITE_OTHER): Payer: BC Managed Care – PPO | Admitting: Family Medicine

## 2019-08-30 ENCOUNTER — Encounter (INDEPENDENT_AMBULATORY_CARE_PROVIDER_SITE_OTHER): Payer: Self-pay | Admitting: Family Medicine

## 2019-08-30 ENCOUNTER — Other Ambulatory Visit: Payer: Self-pay

## 2019-08-30 VITALS — BP 144/80 | HR 90 | Temp 98.3°F | Ht 63.0 in | Wt 228.0 lb

## 2019-08-30 DIAGNOSIS — Z1331 Encounter for screening for depression: Secondary | ICD-10-CM

## 2019-08-30 DIAGNOSIS — E1122 Type 2 diabetes mellitus with diabetic chronic kidney disease: Secondary | ICD-10-CM | POA: Diagnosis not present

## 2019-08-30 DIAGNOSIS — E785 Hyperlipidemia, unspecified: Secondary | ICD-10-CM

## 2019-08-30 DIAGNOSIS — Z0289 Encounter for other administrative examinations: Secondary | ICD-10-CM

## 2019-08-30 DIAGNOSIS — I10 Essential (primary) hypertension: Secondary | ICD-10-CM

## 2019-08-30 DIAGNOSIS — E1169 Type 2 diabetes mellitus with other specified complication: Secondary | ICD-10-CM

## 2019-08-30 DIAGNOSIS — D631 Anemia in chronic kidney disease: Secondary | ICD-10-CM

## 2019-08-30 DIAGNOSIS — Z992 Dependence on renal dialysis: Secondary | ICD-10-CM

## 2019-08-30 DIAGNOSIS — I152 Hypertension secondary to endocrine disorders: Secondary | ICD-10-CM

## 2019-08-30 DIAGNOSIS — N185 Chronic kidney disease, stage 5: Secondary | ICD-10-CM

## 2019-08-30 DIAGNOSIS — Z794 Long term (current) use of insulin: Secondary | ICD-10-CM

## 2019-08-30 DIAGNOSIS — E1159 Type 2 diabetes mellitus with other circulatory complications: Secondary | ICD-10-CM

## 2019-08-30 DIAGNOSIS — I953 Hypotension of hemodialysis: Secondary | ICD-10-CM | POA: Diagnosis not present

## 2019-08-30 DIAGNOSIS — N186 End stage renal disease: Secondary | ICD-10-CM | POA: Diagnosis not present

## 2019-08-30 DIAGNOSIS — K760 Fatty (change of) liver, not elsewhere classified: Secondary | ICD-10-CM

## 2019-08-30 DIAGNOSIS — G4719 Other hypersomnia: Secondary | ICD-10-CM

## 2019-08-30 DIAGNOSIS — Z6841 Body Mass Index (BMI) 40.0 and over, adult: Secondary | ICD-10-CM

## 2019-08-30 DIAGNOSIS — R5383 Other fatigue: Secondary | ICD-10-CM

## 2019-08-30 DIAGNOSIS — Z9189 Other specified personal risk factors, not elsewhere classified: Secondary | ICD-10-CM

## 2019-08-30 DIAGNOSIS — N2581 Secondary hyperparathyroidism of renal origin: Secondary | ICD-10-CM | POA: Diagnosis not present

## 2019-08-30 DIAGNOSIS — R0602 Shortness of breath: Secondary | ICD-10-CM

## 2019-08-30 NOTE — Progress Notes (Signed)
Chief Complaint:   Cassandra Campos (MR# [id number]) is a 49 y.o. female who presents for evaluation and treatment of obesity and related comorbidities. Current BMI is Body mass index is 40.39 kg/m. Cassandra Campos has been struggling with her weight for many years and has been unsuccessful in either losing weight, maintaining weight loss, or reaching her healthy weight goal.  Cassandra Campos is currently in the action stage of change and ready to dedicate time achieving and maintaining a healthier weight. Cassandra Campos is interested in becoming our patient and working on intensive lifestyle modifications including (but not limited to) diet and exercise for weight loss.  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  She lives with her husband and son.  She loves vegetables.  Drinks are limited to around 32 ounces a day due to renal disease.  Cassandra Campos provided the following food recall today: Breakfast:  Egg McMuffin, [employer] Lunch:  Fried fish/chicken, pizza, subs. Dinner:  Out - [place], Music therapist.  Cassandra Campos's habits were reviewed today and are as follows: Her family eats meals together, she thinks her family will eat healthier with her, her desired weight loss is 52 pounds, she has been heavy most of her life, she started gaining weight after the birth of her son, her heaviest weight ever was 260 pounds, she craves pasta and green vegetables, she skips breakfast 4 times a week, she is frequently drinking liquids with calories, she frequently makes poor food choices, she frequently eats larger portions than normal and she struggles with emotional eating.  Depression Screen Cassandra Campos's Food and Mood (modified PHQ-9) score was 18.  Depression screen PHQ 2/9 08/30/2019  Decreased Interest 2  Down, Depressed, Hopeless 1  PHQ - 2 Score 3  Altered sleeping 3  Tired, decreased energy 3  Change in appetite 3  Feeling bad or failure about yourself  1  Trouble  concentrating 2  Moving slowly or fidgety/restless 3  Suicidal thoughts 0  PHQ-9 Score 18  Difficult doing work/chores Not difficult at all  Some recent data might be hidden   Subjective:   1. Other fatigue Cassandra Campos admits to daytime somnolence and reports waking up still tired. Patent has a history of symptoms of daytime fatigue, morning fatigue and snoring. Cassandra Campos generally gets 5 or 6 hours of sleep per night, and states that she has poor quality sleep. Snoring is present. Apneic episodes are not present. Epworth Sleepiness Score is 15.  2. SOB (shortness of breath) on exertion Cassandra Campos notes increasing shortness of breath with exercising and seems to be worsening over time with weight gain. She notes getting out of breath sooner with activity than she used to. This has not gotten worse recently. Cassandra Campos denies shortness of breath at rest or orthopnea.  3. Hypertension associated with diabetes (Cassandra Campos) Review: taking medications as instructed, no medication side effects noted, no chest pain on exertion, no dyspnea on exertion, no swelling of ankles.   BP Readings from Last 3 Encounters:  08/30/19 (!) 144/80  08/07/19 128/66  07/24/19 134/82   4. Type 2 diabetes mellitus without complication, with long-term current use of insulin (Cassandra Campos) Cassandra Campos's DM was first diagnosed in 1997 and she has been on insulin since that time. She has diabetic neuropathy and  Retinopathy, along with CKD 5, on dialysis and waiting for a kidney transplant, followed by [facility]. Cassandra Campos is taking Humalin R 180 units in the morning and 140 units in the evening, Ozempic 1  mg weekly.  A1c was 10.9 on 04/19/2019 due to being out of insulin.  Previous was 6.9.  She is checking fasting blood sugars as well as 2 hour postprandial at lunch and dinner.  Generally, fasting blood sugar is around 100.  She has an appointment with Dr. Cruzita Lederer on September 11, 2019.  Lab Results  Component Value Date   HGBA1C 10.9 (H) 04/19/2019   HGBA1C  6.9 (H) 11/09/2018   HGBA1C 7.1 (H) 07/11/2018   Lab Results  Component Value Date   MICROALBUR 150 08/07/2019   LDLCALC 96 08/07/2019   CREATININE 5.58 (H) 07/17/2019   5. Hyperlipidemia associated with type 2 diabetes mellitus (Cassandra Campos) Cassandra Campos has hyperlipidemia and has been trying to improve her cholesterol levels with intensive lifestyle modification including a low saturated fat diet, exercise and weight loss. She denies any chest pain, claudication or myalgias.  She is taking Crestor.  Lab Results  Component Value Date   ALT 32 07/17/2019   AST 27 07/17/2019   ALKPHOS 52 07/17/2019   BILITOT 0.8 07/17/2019   Lab Results  Component Value Date   CHOL 192 08/07/2019   HDL 49 08/07/2019   LDLCALC 96 08/07/2019   TRIG 280 (H) 08/07/2019   CHOLHDL 3.9 08/07/2019   6. Excessive daytime sleepiness Cassandra Campos has the diagnosis of OSA and uses a CPAP nightly.  7. ESRD (end stage renal disease) on dialysis Cassandra Campos) She is on the transplant list.  Her goal is to lose 25 pounds.  She sees Dr. Jimmy Footman.  8. Anemia of chronic renal failure, CKD stage 5 CBC Latest Ref Rng & Units 07/17/2019 11/09/2018 09/20/2018  WBC 4.0 - 10.5 K/uL 7.4 5.1 5.6  Hemoglobin 12.0 - 15.0 g/dL 10.7(L) 11.9 12.9  Hematocrit 36.0 - 46.0 % 31.3(L) 33.0(L) 37.7  Platelets 150 - 400 K/uL 153 217 210   Lab Results  Component Value Date   IRON 56 03/15/2017   IRON 56 03/15/2017   FERRITIN 404.4 (H) 03/15/2017   Lab Results  Component Value Date   [id number] 11/09/2018   9. NAFLD (nonalcoholic fatty liver disease) Cassandra Campos has nonalcoholic fatty liver disease.  Lab Results  Component Value Date   ALT 32 07/17/2019   AST 27 07/17/2019   ALKPHOS 52 07/17/2019   BILITOT 0.8 07/17/2019   Lab Results  Component Value Date   PLT 153 07/17/2019   Body mass index is 40.39 kg/m. 49 y.o.   10. Depression screening Cassandra Campos was screened for depression as part of her new patient appointment.  PHQ-9 is 18  today.  11. At risk for hypoglycemia Cassandra Campos is at increased risk for hypoglycemia due to changes in diet, diagnosis of diabetes, and/or insulin use. Cassandra Campos is currently taking insulin.   Assessment/Plan:   1. Other fatigue Cassandra Campos does feel that her weight is causing her energy to be lower than it should be. Fatigue may be related to obesity, depression or many other causes. Labs will be ordered, and in the meanwhile, Averleigh will focus on self care including making healthy food choices, increasing physical activity and focusing on stress reduction.  2. SOB (shortness of breath) on exertion Cassandra Campos does feel that she gets out of breath more easily that she used to when she exercises. Sumeya's shortness of breath appears to be obesity related and exercise induced. She has agreed to work on weight loss and gradually increase exercise to treat her exercise induced shortness of breath. Will continue to monitor closely.  3. Hypertension associated  with diabetes (Cassandra Campos) Tamilyn is working on healthy weight loss and exercise to improve blood pressure control. We will watch for signs of hypotension as she continues her lifestyle modifications.  4. Type 2 diabetes mellitus with long-term current use of insulin (Cassandra Campos) Complication: Nephropathy, neuropathy, retinopathy. Counseling: Intensive lifestyle modifications are the first line treatment for this issue. We discussed several lifestyle modifications today and she will continue to work on diet, exercise and weight loss efforts. We will continue to monitor. Orders and follow up as documented in patient record.  Orders - Hemoglobin A1c  5. Hyperlipidemia associated with type 2 diabetes mellitus (Cassandra Campos) Cardiovascular risk and specific lipid/LDL goals reviewed.  We discussed several lifestyle modifications today and Argelia will continue to work on diet, exercise and weight loss efforts. Orders and follow up as documented in patient record.    Counseling Intensive lifestyle modifications are the first line treatment for this issue. . Dietary changes: Increase soluble fiber. Decrease simple carbohydrates. . Exercise changes: Moderate to vigorous-intensity aerobic activity 150 minutes per week if tolerated. . Lipid-lowering medications: see documented in medical record.  6. Excessive daytime sleepiness Intensive lifestyle modifications are the first line treatment for this issue. We discussed several lifestyle modifications today and she will continue to work on diet, exercise and weight loss efforts. We will continue to monitor. Orders and follow up as documented in patient record.   Counseling  Sleep apnea is a condition in which breathing pauses or becomes shallow during sleep. This happens over and over during the night. This disrupts your sleep and keeps your body from getting the rest that it needs, which can cause tiredness and lack of energy (fatigue) during the day.  Sleep apnea treatment: If you were given a device to open your airway while you sleep, USE IT!  Sleep hygiene:   Limit or avoid alcohol, caffeinated beverages, and cigarettes, especially close to bedtime.   Do not eat a large meal or eat spicy foods right before bedtime. This can lead to digestive discomfort that can make it hard for you to sleep.  Keep a sleep diary to help you and your health care provider figure out what could be causing your insomnia.  . Make your bedroom a dark, comfortable place where it is easy to fall asleep. ? Put up shades or blackout curtains to block light from outside. ? Use a white noise machine to block noise. ? Keep the temperature cool. . Limit screen use before bedtime. This includes: ? Watching TV. ? Using your smartphone, tablet, or computer. . Stick to a routine that includes going to bed and waking up at the same times every day and night. This can help you fall asleep faster. Consider making a quiet activity, such  as reading, part of your nighttime routine. . Try to avoid taking naps during the day so that you sleep better at night. . Get out of bed if you are still awake after 15 minutes of trying to sleep. Keep the lights down, but try reading or doing a quiet activity. When you feel sleepy, go back to bed.  7. ESRD (end stage renal disease) on dialysis [facility]) Followed by Nephrology and PCP for this problem. Those encounter notes were reviewed. Lab results reviewed with patient. We will continue to monitor. Orders and follow up as documented in patient record.  8. Anemia of chronic renal failure, CKD stage 5 Zhuri will continue to work on weight loss, exercise, and decreasing simple carbohydrates to help  decrease the risk of diabetes. Cassandra Campos agreed to follow-up with [place] as directed to closely monitor her progress.  9. NAFLD (nonalcoholic fatty liver disease) Reviewed 2018 ultrasound reviewed, along with 2020 CT.   NAFLD Fibrosis Score: 1.50.  NAFLD FIBROSIS SCORE: If NASH or high fibrosis score, needs more aggressive treatment.   Age, BMI, impaired fasting glucose, AST, ALT, platelets, albumin  Depending on score and local prevalence of advanced fibrosis, the score can be used to reliably predict (with high 80-low 90% accuracy) which patients are unlikely to have cellular evidence of fibrosis on biopsy.  NAFLD Score                       Correlated Fibrosis Severity < -1.455                       F0-F2 -1.455 - 0.675                       Indeterminant score > 0.675                       F3-F4  Fibrosis Severity Scale F0 = no fibrosis F1 = mild fibrosis F2 = moderate fibrosis F3 = severe fibrosis F4 = cirrhosis   There is not an FDA approved medication available yet to treat NAFLD, but treating comorbid conditions plus weight loss will improve NAFLD. LIVER-DIRECTED treatments include: liraglutide, pioglitazone, and vitamin E.   10. Depression screening Cassandra Campos had a positive depression  screening. Depression is commonly associated with obesity and often results in emotional eating behaviors. We will monitor this closely and work on CBT to help improve the non-hunger eating patterns. Referral to Psychology may be required if no improvement is seen as she continues in our clinic.  11. At risk for hypoglycemia Cassandra Campos was given approximately 15 minutes of counseling today regarding prevention of hypoglycemia. She was advised of symptoms of hypoglycemia. Cassandra Campos was instructed to avoid skipping meals, eat regular protein rich meals and schedule low calorie snacks as needed.   12. Class 3 severe obesity with serious comorbidity and body mass index (BMI) of 40.0 to 44.9 in adult, unspecified obesity type (Cassandra Campos) Cassandra Campos is currently in the action stage of change and her goal is to continue with weight loss efforts. I recommend Cassandra Campos begin the structured treatment plan as follows:  She has agreed to the Category 3 Plan, Renal Diet.  Exercise goals: No exercise has been prescribed at this time.   Behavioral modification strategies: increasing lean protein intake, decreasing simple carbohydrates, increasing vegetables, decreasing sodium intake and increasing high fiber foods.  She was informed of the importance of frequent follow-up visits to maximize her success with intensive lifestyle modifications for her multiple health conditions. She was informed we would discuss her lab results at her next visit unless there is a critical issue that needs to be addressed sooner. Cassandra Campos agreed to keep her next visit at the agreed upon time to discuss these results.  Objective:   Blood pressure (!) 144/80, pulse 90, temperature 98.3 F (36.8 C), temperature source Oral, height 5\' 3"  (1.6 m), weight 228 lb (103.4 kg), SpO2 100 %. Body mass index is 40.39 kg/m.  Indirect Calorimeter completed today shows a VO2 of 302 and a REE of 2105.  Her calculated basal metabolic rate is 7035 thus her basal  metabolic rate is better than expected.  General: Cooperative, alert, well  developed, in no acute distress. HEENT: Conjunctivae and lids unremarkable. Cardiovascular: Regular rhythm.  Lungs: Normal work of breathing. Neurologic: No focal deficits.   Lab Results  Component Value Date   CREATININE 5.58 (H) 07/17/2019   BUN 35 (H) 07/17/2019   NA 136 07/17/2019   K 3.3 (L) 07/17/2019   CL 95 (L) 07/17/2019   CO2 26 07/17/2019   Lab Results  Component Value Date   ALT 32 07/17/2019   AST 27 07/17/2019   ALKPHOS 52 07/17/2019   BILITOT 0.8 07/17/2019   Lab Results  Component Value Date   HGBA1C 10.9 (H) 04/19/2019   HGBA1C 6.9 (H) 11/09/2018   HGBA1C 7.1 (H) 07/11/2018   HGBA1C 8.5 (H) 03/27/2018   HGBA1C 8.2 (H) 02/08/2013   Lab Results  Component Value Date   TSH 2.360 11/09/2018   Lab Results  Component Value Date   CHOL 192 08/07/2019   HDL 49 08/07/2019   LDLCALC 96 08/07/2019   TRIG 280 (H) 08/07/2019   CHOLHDL 3.9 08/07/2019   Lab Results  Component Value Date   WBC 7.4 07/17/2019   HGB 10.7 (L) 07/17/2019   HCT 31.3 (L) 07/17/2019   MCV 89.9 07/17/2019   PLT 153 07/17/2019   Lab Results  Component Value Date   IRON 56 03/15/2017   IRON 56 03/15/2017   FERRITIN 404.4 (H) 03/15/2017   Attestation Statements:   This is the patient's first visit at [facility]. The patient's NEW PATIENT PACKET was reviewed at length. Included in the packet: current and past health history, medications, allergies, ROS, gynecologic history (women only), surgical history, family history, social history, weight history, weight loss surgery history (for those that have had weight loss surgery), nutritional evaluation, mood and food questionnaire, PHQ9, Epworth questionnaire, sleep habits questionnaire, patient life and health improvement goals questionnaire. These will all be scanned into the patient's chart under media.   During the visit, I independently  reviewed the patient's EKG, bioimpedance scale results, and indirect calorimeter results. I used this information to tailor a meal plan for the patient that will help her to lose weight and will improve her obesity-related conditions going forward. I performed a medically necessary appropriate examination and/or evaluation. I discussed the assessment and treatment plan with the patient. The patient was provided an opportunity to ask questions and all were answered. The patient agreed with the plan and demonstrated an understanding of the instructions. Labs were ordered at this visit and will be reviewed at the next visit unless more critical results need to be addressed immediately. Clinical information was updated and documented in the EMR.   I, Water quality scientist, CMA, am acting as Location manager for [employer], DO.  I have reviewed the above documentation for accuracy and completeness, and I agree with the above. Briscoe Deutscher, DO

## 2019-08-31 DIAGNOSIS — N2581 Secondary hyperparathyroidism of renal origin: Secondary | ICD-10-CM | POA: Diagnosis not present

## 2019-08-31 DIAGNOSIS — Z992 Dependence on renal dialysis: Secondary | ICD-10-CM | POA: Diagnosis not present

## 2019-08-31 DIAGNOSIS — N186 End stage renal disease: Secondary | ICD-10-CM | POA: Diagnosis not present

## 2019-08-31 DIAGNOSIS — I953 Hypotension of hemodialysis: Secondary | ICD-10-CM | POA: Diagnosis not present

## 2019-09-03 ENCOUNTER — Encounter (INDEPENDENT_AMBULATORY_CARE_PROVIDER_SITE_OTHER): Payer: Self-pay | Admitting: Family Medicine

## 2019-09-03 DIAGNOSIS — N186 End stage renal disease: Secondary | ICD-10-CM | POA: Diagnosis not present

## 2019-09-03 DIAGNOSIS — N2581 Secondary hyperparathyroidism of renal origin: Secondary | ICD-10-CM | POA: Diagnosis not present

## 2019-09-03 DIAGNOSIS — Z992 Dependence on renal dialysis: Secondary | ICD-10-CM | POA: Diagnosis not present

## 2019-09-03 DIAGNOSIS — I953 Hypotension of hemodialysis: Secondary | ICD-10-CM | POA: Diagnosis not present

## 2019-09-03 NOTE — Telephone Encounter (Signed)
Please advise. Thanks.  

## 2019-09-04 DIAGNOSIS — N186 End stage renal disease: Secondary | ICD-10-CM | POA: Diagnosis not present

## 2019-09-04 DIAGNOSIS — N2581 Secondary hyperparathyroidism of renal origin: Secondary | ICD-10-CM | POA: Diagnosis not present

## 2019-09-04 DIAGNOSIS — Z992 Dependence on renal dialysis: Secondary | ICD-10-CM | POA: Diagnosis not present

## 2019-09-04 DIAGNOSIS — I953 Hypotension of hemodialysis: Secondary | ICD-10-CM | POA: Diagnosis not present

## 2019-09-06 DIAGNOSIS — Z992 Dependence on renal dialysis: Secondary | ICD-10-CM | POA: Diagnosis not present

## 2019-09-06 DIAGNOSIS — N186 End stage renal disease: Secondary | ICD-10-CM | POA: Diagnosis not present

## 2019-09-06 DIAGNOSIS — I953 Hypotension of hemodialysis: Secondary | ICD-10-CM | POA: Diagnosis not present

## 2019-09-06 DIAGNOSIS — N2581 Secondary hyperparathyroidism of renal origin: Secondary | ICD-10-CM | POA: Diagnosis not present

## 2019-09-07 DIAGNOSIS — N2581 Secondary hyperparathyroidism of renal origin: Secondary | ICD-10-CM | POA: Diagnosis not present

## 2019-09-07 DIAGNOSIS — N186 End stage renal disease: Secondary | ICD-10-CM | POA: Diagnosis not present

## 2019-09-07 DIAGNOSIS — I953 Hypotension of hemodialysis: Secondary | ICD-10-CM | POA: Diagnosis not present

## 2019-09-07 DIAGNOSIS — Z992 Dependence on renal dialysis: Secondary | ICD-10-CM | POA: Diagnosis not present

## 2019-09-08 DIAGNOSIS — E1169 Type 2 diabetes mellitus with other specified complication: Secondary | ICD-10-CM | POA: Insufficient documentation

## 2019-09-08 DIAGNOSIS — I152 Hypertension secondary to endocrine disorders: Secondary | ICD-10-CM | POA: Insufficient documentation

## 2019-09-08 DIAGNOSIS — G4719 Other hypersomnia: Secondary | ICD-10-CM | POA: Insufficient documentation

## 2019-09-08 DIAGNOSIS — E1159 Type 2 diabetes mellitus with other circulatory complications: Secondary | ICD-10-CM | POA: Insufficient documentation

## 2019-09-08 DIAGNOSIS — K760 Fatty (change of) liver, not elsewhere classified: Secondary | ICD-10-CM | POA: Insufficient documentation

## 2019-09-08 DIAGNOSIS — Z6841 Body Mass Index (BMI) 40.0 and over, adult: Secondary | ICD-10-CM | POA: Insufficient documentation

## 2019-09-10 DIAGNOSIS — Z03818 Encounter for observation for suspected exposure to other biological agents ruled out: Secondary | ICD-10-CM | POA: Diagnosis not present

## 2019-09-10 DIAGNOSIS — N2581 Secondary hyperparathyroidism of renal origin: Secondary | ICD-10-CM | POA: Diagnosis not present

## 2019-09-10 DIAGNOSIS — N186 End stage renal disease: Secondary | ICD-10-CM | POA: Diagnosis not present

## 2019-09-10 DIAGNOSIS — Z992 Dependence on renal dialysis: Secondary | ICD-10-CM | POA: Diagnosis not present

## 2019-09-10 DIAGNOSIS — I953 Hypotension of hemodialysis: Secondary | ICD-10-CM | POA: Diagnosis not present

## 2019-09-10 DIAGNOSIS — Z20828 Contact with and (suspected) exposure to other viral communicable diseases: Secondary | ICD-10-CM | POA: Diagnosis not present

## 2019-09-11 ENCOUNTER — Ambulatory Visit: Payer: BC Managed Care – PPO | Admitting: Internal Medicine

## 2019-09-11 ENCOUNTER — Ambulatory Visit (INDEPENDENT_AMBULATORY_CARE_PROVIDER_SITE_OTHER): Payer: BC Managed Care – PPO | Admitting: Internal Medicine

## 2019-09-11 ENCOUNTER — Encounter: Payer: Self-pay | Admitting: Internal Medicine

## 2019-09-11 ENCOUNTER — Other Ambulatory Visit: Payer: Self-pay

## 2019-09-11 VITALS — BP 140/60 | HR 90 | Ht 63.0 in | Wt 229.0 lb

## 2019-09-11 DIAGNOSIS — Z992 Dependence on renal dialysis: Secondary | ICD-10-CM

## 2019-09-11 DIAGNOSIS — Z794 Long term (current) use of insulin: Secondary | ICD-10-CM

## 2019-09-11 DIAGNOSIS — N186 End stage renal disease: Secondary | ICD-10-CM

## 2019-09-11 DIAGNOSIS — N2581 Secondary hyperparathyroidism of renal origin: Secondary | ICD-10-CM | POA: Diagnosis not present

## 2019-09-11 DIAGNOSIS — I953 Hypotension of hemodialysis: Secondary | ICD-10-CM | POA: Diagnosis not present

## 2019-09-11 DIAGNOSIS — E1122 Type 2 diabetes mellitus with diabetic chronic kidney disease: Secondary | ICD-10-CM | POA: Diagnosis not present

## 2019-09-11 LAB — POCT GLYCOSYLATED HEMOGLOBIN (HGB A1C): Hemoglobin A1C: 8.7 % — AB (ref 4.0–5.6)

## 2019-09-11 MED ORDER — OZEMPIC (1 MG/DOSE) 4 MG/3ML ~~LOC~~ SOPN: 1.0000 mg | PEN_INJECTOR | SUBCUTANEOUS | 3 refills | Status: DC

## 2019-09-11 MED ORDER — INSULIN PEN NEEDLE 32G X 4 MM MISC: 3 refills | Status: DC

## 2019-09-11 MED ORDER — HUMULIN R U-500 (CONCENTRATED) 500 UNIT/ML ~~LOC~~ SOLN: SUBCUTANEOUS | 11 refills | Status: DC

## 2019-09-11 NOTE — Progress Notes (Signed)
Patient ID: Cassandra Campos, female   DOB: 01-21-1971, 49 y.o.   MRN: [id number]   This visit occurred during the SARS-CoV-2 public health emergency.  Safety protocols were in place, including screening questions prior to the visit, additional usage of staff PPE, and extensive cleaning of exam room while observing appropriate contact time as indicated for disinfecting solutions.   HPI: Cassandra Campos is a 49 y.o.-year-old female, referred by her PCP, Dr. Baird Cancer, for management of DM2, dx in 1997, insulin-dependent right after dx, uncontrolled, with complications (ESRD-on HD since 12/2017, DR, PN).  She recently just started to work with the [facility].  Reviewed HbA1c levels: Lab Results  Component Value Date   HGBA1C 10.9 (H) 04/19/2019   HGBA1C 6.9 (H) 11/09/2018   HGBA1C 7.1 (H) 07/11/2018   HGBA1C 8.5 (H) 03/27/2018   HGBA1C 8.2 (H) 02/08/2013   Pt is on a regimen of: - Ozempic 1 mg weekly - 1 mo ago - U500 insulin 180 units before breakfast and 140 units before dinner - changed to U500 in 2020 She was on Levemir and Humalog.  Pt was checking her sugars 3x a day - now 0-1x a day: - am: n/c >> 78 -226, 407 (no insulin at night) - 2h after b'fast: n/c - before lunch: 19-509T (with certain foods or w/o insulin) - 2h after lunch: n/c - before dinner: 96-300s - 2h after dinner: n/c - bedtime: n/c - nighttime: n/c Lowest sugar was 71; she has hypoglycemia awareness at 60s.  Highest sugar was 407.  Glucometer: Contour next  Pt's meals are: - Breakfast: yoghurt and blueberries; egg McMuffin - Lunch: fish, tacos, subs, cake - Dinner: salad, steak, shrimp - occasionally after HD at 10 pm - Snacks: fruit, peanuts  -+ ESRD, last BUN/creatinine:  Lab Results  Component Value Date   BUN 35 (H) 07/17/2019   BUN 17 09/20/2018   CREATININE 5.58 (H) 07/17/2019   CREATININE 3.85 (H) 09/20/2018   HD: M, Tu, Th, F (home) - in the evening, for 3-4 hours:  6:30 pm-10 pm She is on the kidney transplant list at [facility]. Requirements for kidney transplant: <200 lbs, HbA1c <8%.  - +HL; last set of lipids: Lab Results  Component Value Date   CHOL 192 08/07/2019   HDL 49 08/07/2019   LDLCALC 96 08/07/2019   TRIG 280 (H) 08/07/2019   CHOLHDL 3.9 08/07/2019  On Crestor 20.  - last eye exam was in 03/21/2019: + DR. + h/o retinal detachment, + cataracts. She has IO injections.  - + numbness and tingling in her feet.  On Lyrica.  Pt has FH of DM in M, F..  She also has a history of NAFLD, OSA (on CPAP), gout.   ROS: Constitutional: + All: Weight gain, decreased appetite, fatigue, heat and cold intolerance, poor sleep, excessive urination Eyes: + Blurry vision, no xerophthalmia ENT: + Sore throat,  no dysphagia, no odynophagia, no hoarseness, no tinnitus, no hypoacusis Cardiovascular: + CP, no SOB, + palpitations, + leg swelling Respiratory: no cough, no SOB, + wheezing Gastrointestinal: + N, no V, + D, no C, no acid reflux Musculoskeletal: + Muscle and joint aches Skin: no rash, no hair loss, + itching Neurological: no tremors, no numbness or tingling/no dizziness/no HAs Psychiatric: no depression, no anxiety + Low libido  Past Medical History:  Diagnosis Date  . Anemia associated with chronic renal failure   . ASCUS of cervix with negative high risk HPV 06/2017  . Back  pain   . Chest pain   . CHF (congestive heart failure) ([place])   . CKD (chronic kidney disease), stage IV ([place]) no dialysis yet   nephrologist-  dr Jamal Maes-- scheduled next visit 09-08-2017 (lov 07-05-2017)  . Constipation   . Diabetic retinopathy ([place])   . Dialysis patient ([place])   . Edema, lower extremity   . Elevated transaminase level   . Fatty liver    FOLLOWED BY DR Henrene Pastor  . GERD (gastroesophageal reflux disease)   . Hypertension   . Idiopathic gout of multiple sites    09-05-2017 per pt stable  . Insulin dependent type 2 diabetes mellitus ([place])     followed by dr Toy Care  . Joint pain   . Mixed hyperlipidemia   . OSA (obstructive sleep apnea)    per study 10-20-2015 mild osa w/ AHI 10.3/hr;  09-05-2017 per pt has not used cpap since 1 yr ago  . Palpitations   . Peripheral neuropathy    feet  . S/P arteriovenous (AV) fistula creation 07-04-2015  w/ revision 02-14-2017   left braniocephilic  . Shortness of breath   . Trigger thumb, right thumb   . Umbilical hernia   . Vitamin D deficiency    Past Surgical History:  Procedure Laterality Date  . ABDOMINAL HYSTERECTOMY  07/11/2000   dr gott   TAH/BSO for irregular bleeding and leiomyoma  . AV FISTULA PLACEMENT Left 07/04/2015   Procedure: ARTERIOVENOUS (AV) FISTULA CREATION-LEFT;  Surgeon: Mal Misty, MD;  Location: [place];  Service: Vascular;  Laterality: Left;  . BREAST EXCISIONAL BIOPSY Right    benign  . CARPAL TUNNEL RELEASE Bilateral 1993 and 1994  . CESAREAN SECTION  1993:  09-25-1999   BILATERAL TUBAL LIGATION W/ LAST C/S  . DILATION AND CURETTAGE OF UTERUS    . EXPLORATORY LAPARTOMY / LYSIS ADHESIONS/  BILATERAL SALPINGOOPHORECTOMY  07-20-2011  dr s. Rhodia Albright  [facility]  . FISTULA SUPERFICIALIZATION Left 08/28/2015   Procedure: BRACHIOCEPHALIC FISTULA SUPERFICIALIZATION;  Surgeon: Mal Misty, MD;  Location: [place];  Service: Vascular;  Laterality: Left;  . PATCH ANGIOPLASTY Left 02/14/2017   Procedure: PATCH ANGIOPLASTY;  Surgeon: Elam Dutch, MD;  Location: [place];  Service: Vascular;  Laterality: Left;  . PELVIC LAPAROSCOPY  1994   exploration for ectopic preg.  Marland Kitchen RETINAL DETACHMENT SURGERY Left 2015  . REVISON OF ARTERIOVENOUS FISTULA Left 02/14/2017   Procedure: REVISON OF ARTERIOVENOUS FISTULA  LEFT ARM;  Surgeon: Elam Dutch, MD;  Location: [place];  Service: Vascular;  Laterality: Left;  . TRIGGER FINGER RELEASE Left 2015   thumb  . TRIGGER FINGER RELEASE Right 09/09/2017   Procedure: RELEASE TRIGGER FINGER/A-1 PULLEY RIGHT THUMB;  Surgeon: Dorna Leitz, MD;   Location: [place];  Service: Orthopedics;  Laterality: Right;   Social History   Socioeconomic History  . Marital status: Married    Spouse name: Not on file  . Number of children: 2  . Years of education: Not on file  . Highest education level: Not on file  Occupational History  . Occupation: Press photographer and insurance  Tobacco Use  . Smoking status: Never Smoker  . Smokeless tobacco: Never Used  Substance and Sexual Activity  . Alcohol use: Never    Alcohol/week: 0.0 standard drinks  . Drug use: No  . Sexual activity: Yes    Birth control/protection: Surgical    Comment: HYST-1st intercourse 49 yo-Fewer than 5 partners  Other Topics Concern  .  Not on file  Social History Narrative  . Not on file   Social Determinants of Health   Financial Resource Strain:   . Difficulty of Paying Living Expenses:   Food Insecurity:   . Worried About Charity fundraiser in the Last Year:   . Arboriculturist in the Last Year:   Transportation Needs:   . Film/video editor (Medical):   Marland Kitchen Lack of Transportation (Non-Medical):   Physical Activity:   . Days of Exercise per Week:   . Minutes of Exercise per Session:   Stress:   . Feeling of Stress :   Social Connections:   . Frequency of Communication with Friends and Family:   . Frequency of Social Gatherings with Friends and Family:   . Attends Religious Services:   . Active Member of Clubs or Organizations:   . Attends Archivist Meetings:   Marland Kitchen Marital Status:   Intimate Partner Violence:   . Fear of Current or Ex-Partner:   . Emotionally Abused:   Marland Kitchen Physically Abused:   . Sexually Abused:    Current Outpatient Medications on File Prior to Visit  Medication Sig Dispense Refill  . allopurinol (ZYLOPRIM) 300 MG tablet Take 1 tablet (300 mg total) by mouth every morning. 90 tablet 1  . B Complex-C-Folic Acid (RENAL) 1 MG CAPS TAKE 1 CAPSULE BY MOUTH DAILY (ON DIALYSIS DAYS, TAKE AFTER DIALYSIS  TREATMENT )    . BELSOMRA 15 MG TABS Take 1 tablet by mouth at bedtime.    . calcitRIOL (ROCALTROL) 0.5 MCG capsule Take 0.5 mcg by mouth daily.    . Chlorphen-PE-Acetaminophen (NOREL AD) 4-10-325 MG TABS Take 4-10 mg by mouth 2 (two) times daily as needed. 20 tablet 0  . cinacalcet (SENSIPAR) 60 MG tablet Take 60 mg by mouth daily. Every other day    . cromolyn (OPTICROM) 4 % ophthalmic solution 1 drop 4 (four) times daily.    Marland Kitchen EASY COMFORT PEN NEEDLES 31G X 8 MM MISC INJECT TWICE A DAY AS DIRECTED  3  . HUMULIN R 500 UNIT/ML injection 180 units in the morning and 140 units in the evening before meals (Patient taking differently: 170 units in the morning and 130 units in the evening before meals) 20 mL 11  . lanthanum (FOSRENOL) 1000 MG chewable tablet Chew 1,000 mg by mouth 3 (three) times daily with meals.    . lidocaine-prilocaine (EMLA) cream USE ON ARAMS AS NEEDED FOR DIALYSIS  10  . Microlet Lancets MISC Use as directed to check blood sugars 4 times per day dx: e11.22 300 each 2  . MITIGARE 0.6 MG CAPS TAKE ONE CAPSULE BY MOUTH ONCE DAILY 90 capsule 0  . ondansetron (ZOFRAN) 4 MG tablet Take 1 tablet (4 mg total) by mouth every 8 (eight) hours as needed for nausea or vomiting. 10 tablet 0  . pregabalin (LYRICA) 100 MG capsule TAKE 1 CAPSULE BY MOUTH IN THE MORNING FOR NEUROPATHY 90 capsule 1  . pregabalin (LYRICA) 75 MG capsule Take one capsule po qam 90 capsule 0  . RESTASIS 0.05 % ophthalmic emulsion INSTILL 1 DROP INTO BOTH EYES TWICE A DAY  3  . rosuvastatin (CRESTOR) 20 MG tablet TAKE 1 TABLET BY MOUTH EVERY DAY 90 tablet 1  . benzonatate (TESSALON PERLES) 100 MG capsule Take 1 capsule (100 mg total) by mouth 3 (three) times daily as needed for cough. (Patient not taking: Reported on 09/11/2019) 30 capsule 1  . neomycin-polymyxin-hydrocortisone (  CORTISPORIN) OTIC solution Apply 1-2 drops to the toe after soaking toe twice a day (Patient not taking: Reported on 07/24/2019) 10 mL 0   No  current facility-administered medications on file prior to visit.   No Known Allergies Family History  Problem Relation Age of Onset  . Diabetes Mother   . Hypertension Mother   . Thyroid disease Mother   . Diabetes Father   . Hypertension Father   . Cancer Father        STOMACH  . Stomach cancer Father   . Stroke Maternal Grandmother   . Stroke Maternal Grandfather   . Colon cancer Neg Hx   . Rectal cancer Neg Hx     PE: BP 140/60   Pulse 90   Ht 5\' 3"  (1.6 m)   Wt 229 lb (103.9 kg)   SpO2 98%   BMI 40.57 kg/m  Wt Readings from Last 3 Encounters:  09/11/19 229 lb (103.9 kg)  08/30/19 228 lb (103.4 kg)  08/07/19 228 lb 9.6 oz (103.7 kg)   Constitutional: overweight, in NAD Eyes: PERRLA, EOMI, no exophthalmos ENT: moist mucous membranes, no thyromegaly, no cervical lymphadenopathy Cardiovascular: RRR, No MRG Respiratory: CTA B Gastrointestinal: abdomen soft, NT, ND, BS+ Musculoskeletal: no deformities, strength intact in all 4 Skin: moist, warm, no rashes Neurological: no tremor with outstretched hands, DTR normal in all 4  ASSESSMENT: 1. DM2, insulin-dependent, uncontrolled, with complications - ESRD, on HD, awaiting kidney transplant - DR - PN  PLAN:  1. Patient with long-standing, uncontrolled diabetes, on injectable antidiabetic regimen with weekly GLP-1 receptor agonist and concentrated insulin, which became insufficient.  Her most recent HbA1c was 10.9% 4 months ago.  At today's visit, HbA1c was 8.7% (lower). -2 weeks ago she started to work with the [employer] management clinic.  She needs to lose weight and be under 200 pounds to qualify for transplant.  Also, from the diabetes point of view, her HbA1c needs to be lower than 8%. -Reviewing her sugars at home, her sugars are fluctuating and we have to be careful with insulin doses due to her dialysis times.  We discussed that insulin could be dialyzed to a certain extent.  She takes her evening insulin 30  minutes before dinner.  However, dinner could be right before dialysis at 6:30 PM or right after dialysis at 10 PM.  Whenever she takes the insulin at 10 PM, she noticed that her sugars are dropping in the morning.  We discussed about reducing the dose of insulin if she has to have a late dinner.  Also, this means that she may dialyze off part of the insulin taken before the 6:30 PM meal.  Therefore, I advised her to increase this dose for now. -In the morning, she mentions that her sugars may be lower when she takes 180 units before a very light breakfast (yogurt and blueberries this morning).  At today's visit, approximately 2.5 hours after breakfast, her sugar was 178, increased from 112 fasting around 9 AM.  Therefore, for now, I advised her to increase slightly her U500 insulin dose in the morning. -For now we will continue Ozempic at 1 mg daily.  She tolerates this well. - I suggested to:  Patient Instructions  Please continue: - Ozempic 1 mg weekly  Please change U500 insulin: - 180-195 units before breakfast - 140-150 units before dinner if dinner is early, and 110-120 units if dinner is after dialysis.  Please check sugars 3 times a day, before  meals and occasionally at bedtime.  Please let me know if the sugars are consistently <80 or >200.  Please return in 1.5 months with your sugar log.   - Strongly advised her to start checking sugars at different times of the day - check 3x a day, rotating checks - discussed about CBG targets for treatment: 80-130 mg/dL before meals and <180 mg/dL after meals; target HbA1c <7%. - given sugar log and advised how to fill it and to bring it at next appt  - given foot care handout and explained the principles  - given instructions for hypoglycemia management "15-15 rule"  - advised for yearly eye exams  - Return to clinic in 3 mo with sugar log   Philemon Kingdom, MD PhD [facility] Endocrinology

## 2019-09-11 NOTE — Patient Instructions (Addendum)
Please continue: - Ozempic 1 mg weekly  Please change U500 insulin: - 180-195 units before breakfast - 140-150 units before dinner if dinner is early, and 110-120 units if dinner is after dialysis.  Please check sugars 3 times a day, before meals and occasionally at bedtime.  Please let me know if the sugars are consistently <80 or >200.  Please return in 1.5 months with your sugar log.   PATIENT INSTRUCTIONS FOR TYPE 2 DIABETES:  DIET AND EXERCISE Diet and exercise is an important part of diabetic treatment.  We recommended aerobic exercise in the form of brisk walking (working between 40-60% of maximal aerobic capacity, similar to brisk walking) for 150 minutes per week (such as 30 minutes five days per week) along with 3 times per week performing 'resistance' training (using various gauge rubber tubes with handles) 5-10 exercises involving the major muscle groups (upper body, lower body and core) performing 10-15 repetitions (or near fatigue) each exercise. Start at half the above goal but build slowly to reach the above goals. If limited by weight, joint pain, or disability, we recommend daily walking in a swimming pool with water up to waist to reduce pressure from joints while allow for adequate exercise.    BLOOD GLUCOSES Monitoring your blood glucoses is important for continued management of your diabetes. Please check your blood glucoses 2-4 times a day: fasting, before meals and at bedtime (you can rotate these measurements - e.g. one day check before the 3 meals, the next day check before 2 of the meals and before bedtime, etc.).   HYPOGLYCEMIA (low blood sugar) Hypoglycemia is usually a reaction to not eating, exercising, or taking too much insulin/ other diabetes drugs.  Symptoms include tremors, sweating, hunger, confusion, headache, etc. Treat IMMEDIATELY with 15 grams of Carbs: . 4 glucose tablets .  cup regular juice/soda . 2 tablespoons raisins . 4 teaspoons sugar . 1  tablespoon honey Recheck blood glucose in 15 mins and repeat above if still symptomatic/blood glucose <100.  RECOMMENDATIONS TO REDUCE YOUR RISK OF DIABETIC COMPLICATIONS: * Take your prescribed MEDICATION(S) * Follow a DIABETIC diet: Complex carbs, fiber rich foods, (monounsaturated and polyunsaturated) fats * AVOID saturated/trans fats, high fat foods, >2,300 mg salt per day. * EXERCISE at least 5 times a week for 30 minutes or preferably daily.  * DO NOT SMOKE OR DRINK more than 1 drink a day. * Check your FEET every day. Do not wear tightfitting shoes. Contact us if you develop an ulcer * See your EYE doctor once a year or more if needed * Get a FLU shot once a year * Get a PNEUMONIA vaccine once before and once after age 64 years  GOALS:  * Your Hemoglobin A1c of <7%  * fasting sugars need to be <130 * after meals sugars need to be <180 (2h after you start eating) * Your Systolic BP should be 102 or lower  * Your Diastolic BP should be 80 or lower  * Your HDL (Good Cholesterol) should be 40 or higher  * Your LDL (Bad Cholesterol) should be 100 or lower. * Your Triglycerides should be 150 or lower  * Your Urine microalbumin (kidney function) should be <30 * Your Body Mass Index should be 25 or lower    Please consider the following ways to cut down carbs and fat and increase fiber and micronutrients in your diet: - substitute whole grain for white bread or pasta - substitute brown rice for white rice -  substitute 90-calorie flat bread pieces for slices of bread when possible - substitute sweet potatoes or yams for white potatoes - substitute humus for margarine - substitute tofu for cheese when possible - substitute almond or rice milk for regular milk (would not drink soy milk daily due to concern for soy estrogen influence on breast cancer risk) - substitute dark chocolate for other sweets when possible - substitute water - can add lemon or orange slices for taste - for diet  sodas (artificial sweeteners will trick your body that you can eat sweets without getting calories and will lead you to overeating and weight gain in the long run) - do not skip breakfast or other meals (this will slow down the metabolism and will result in more weight gain over time)  - can try smoothies made from fruit and almond/rice milk in am instead of regular breakfast - can also try old-fashioned (not instant) oatmeal made with almond/rice milk in am - order the dressing on the side when eating salad at a restaurant (pour less than half of the dressing on the salad) - eat as little meat as possible - can try juicing, but should not forget that juicing will get rid of the fiber, so would alternate with eating raw veg./fruits or drinking smoothies - use as little oil as possible, even when using olive oil - can dress a salad with a mix of balsamic vinegar and lemon juice, for e.g. - use agave nectar, stevia sugar, or regular sugar rather than artificial sweateners - steam or broil/roast veggies  - snack on veggies/fruit/nuts (unsalted, preferably) when possible, rather than processed foods - reduce or eliminate aspartame in diet (it is in diet sodas, chewing gum, etc) Read the labels!  Try to read Dr. Janene Harvey book: "Program for Reversing Diabetes" for other ideas for healthy eating.

## 2019-09-13 ENCOUNTER — Ambulatory Visit (INDEPENDENT_AMBULATORY_CARE_PROVIDER_SITE_OTHER): Payer: BC Managed Care – PPO | Admitting: Family Medicine

## 2019-09-13 ENCOUNTER — Encounter (INDEPENDENT_AMBULATORY_CARE_PROVIDER_SITE_OTHER): Payer: Self-pay | Admitting: Family Medicine

## 2019-09-13 ENCOUNTER — Other Ambulatory Visit: Payer: Self-pay

## 2019-09-13 VITALS — BP 146/80 | HR 94 | Temp 98.2°F | Ht 63.0 in | Wt 228.0 lb

## 2019-09-13 DIAGNOSIS — E785 Hyperlipidemia, unspecified: Secondary | ICD-10-CM

## 2019-09-13 DIAGNOSIS — Z992 Dependence on renal dialysis: Secondary | ICD-10-CM

## 2019-09-13 DIAGNOSIS — N186 End stage renal disease: Secondary | ICD-10-CM

## 2019-09-13 DIAGNOSIS — E1159 Type 2 diabetes mellitus with other circulatory complications: Secondary | ICD-10-CM | POA: Diagnosis not present

## 2019-09-13 DIAGNOSIS — I10 Essential (primary) hypertension: Secondary | ICD-10-CM

## 2019-09-13 DIAGNOSIS — E1122 Type 2 diabetes mellitus with diabetic chronic kidney disease: Secondary | ICD-10-CM

## 2019-09-13 DIAGNOSIS — E1169 Type 2 diabetes mellitus with other specified complication: Secondary | ICD-10-CM

## 2019-09-13 DIAGNOSIS — Z9189 Other specified personal risk factors, not elsewhere classified: Secondary | ICD-10-CM

## 2019-09-13 DIAGNOSIS — Z6841 Body Mass Index (BMI) 40.0 and over, adult: Secondary | ICD-10-CM

## 2019-09-13 DIAGNOSIS — Z794 Long term (current) use of insulin: Secondary | ICD-10-CM

## 2019-09-13 DIAGNOSIS — I152 Hypertension secondary to endocrine disorders: Secondary | ICD-10-CM

## 2019-09-13 DIAGNOSIS — I953 Hypotension of hemodialysis: Secondary | ICD-10-CM | POA: Diagnosis not present

## 2019-09-13 DIAGNOSIS — N2581 Secondary hyperparathyroidism of renal origin: Secondary | ICD-10-CM | POA: Diagnosis not present

## 2019-09-13 NOTE — Progress Notes (Signed)
Chief Complaint:   OBESITY Cassandra Campos is here to discuss her progress with her obesity treatment plan along with follow-up of her obesity related diagnoses. Cassandra Campos is on the Category 3 Plan and states she is following her eating plan approximately 75% of the time. Cassandra Campos states she is exercising for 0 minutes 0 times per week.  Today's visit was #: 2 Starting weight: 228 lbs Starting date: 08/30/2019 Today's weight: 228 lbs Today's date: 09/13/2019 Total lbs lost to date: 0 Total lbs lost since last in-office visit: 0  Interim History:   Cassandra Campos provided the following food recall today: Breakfast:  Eggs, bread Lunch:  Roast beef sandwich, apple Dinner:  Meat, vegetables Drinks:  Water.  No sugary drinks.  She is avoiding dairy products due to the phosphorus content.  Subjective:   1. Type 2 diabetes mellitus with chronic kidney disease on chronic dialysis, with long-term current use of insulin (HCC) Cassandra Campos's recent visit to Dr. Cruzita Lederer was reviewed.  A1c has improved.   Lab Results  Component Value Date   HGBA1C 8.7 (A) 09/11/2019   HGBA1C 10.9 (H) 04/19/2019   HGBA1C 6.9 (H) 11/09/2018   Lab Results  Component Value Date   MICROALBUR 150 08/07/2019   LDLCALC 96 08/07/2019   CREATININE 5.58 (H) 07/17/2019   2. Hypertension associated with diabetes ([place]) Review: taking medications as instructed, no medication side effects noted, no chest pain on exertion, no dyspnea on exertion, no swelling of ankles.  Blood pressure is stable, but is not at goal.  BP Readings from Last 3 Encounters:  09/13/19 (!) 146/80  09/11/19 140/60  08/30/19 (!) 144/80   3. Hyperlipidemia associated with type 2 diabetes mellitus ([place]) Cassandra Campos has hyperlipidemia and has been trying to improve her cholesterol levels with intensive lifestyle modification including a low saturated fat diet, exercise and weight loss. She denies any chest pain, claudication or myalgias. Cassandra Campos is taking  rosuvastatin.  Lab Results  Component Value Date   ALT 32 07/17/2019   AST 27 07/17/2019   ALKPHOS 52 07/17/2019   BILITOT 0.8 07/17/2019   Lab Results  Component Value Date   CHOL 192 08/07/2019   HDL 49 08/07/2019   LDLCALC 96 08/07/2019   TRIG 280 (H) 08/07/2019   CHOLHDL 3.9 08/07/2019   4. 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  She needs her BMI to be below 40 for transplant.  Assessment/Plan:   1. Type 2 diabetes mellitus with chronic kidney disease on chronic dialysis, with long-term current use of insulin (HCC) Good blood sugar control is important to decrease the likelihood of diabetic complications such as nephropathy, neuropathy, limb loss, blindness, coronary artery disease, and death. Intensive lifestyle modification including diet, exercise and weight loss are the first line of treatment for diabetes.   2. Hypertension associated with diabetes ([place]) Cassandra Campos is working on healthy weight loss and exercise to improve blood pressure control. We will watch for signs of hypotension as she continues her lifestyle modifications.  3. Hyperlipidemia associated with type 2 diabetes mellitus ([place]) Cardiovascular risk and specific lipid/LDL goals reviewed.  We discussed several lifestyle modifications today and Cassandra Campos will continue to work on diet, exercise and weight loss efforts. Orders and follow up as documented in patient record.   Counseling Intensive lifestyle modifications are the first line treatment for this issue. . Dietary changes: Increase soluble fiber. Decrease simple carbohydrates. . Exercise changes: Moderate to vigorous-intensity aerobic activity  150 minutes per week if tolerated. . Lipid-lowering medications: see documented in medical record.  4. ESRD (end stage renal disease) on dialysis ([place]) Cassandra Campos will continue to work on weight loss to bring her BMI below 40.  5. At risk for heart disease Cassandra Campos  was given approximately 15 minutes of coronary artery disease prevention counseling today. She is 49 y.o. female and has risk factors for heart disease including obesity. We discussed intensive lifestyle modifications today with an emphasis on specific weight loss instructions and strategies.   Repetitive spaced learning was employed today to elicit superior memory formation and behavioral change.  6. Class 3 severe obesity with serious comorbidity and body mass index (BMI) of 40.0 to 44.9 in adult, unspecified obesity type (HCC) Cassandra Campos is currently in the action stage of change. As such, her goal is to continue with weight loss efforts. She has agreed to the Category 3 Plan.   Add Nepro shakes as needed.  Exercise goals: For substantial health benefits, adults should do at least 150 minutes (2 hours and 30 minutes) a week of moderate-intensity, or 75 minutes (1 hour and 15 minutes) a week of vigorous-intensity aerobic physical activity, or an equivalent combination of moderate- and vigorous-intensity aerobic activity. Aerobic activity should be performed in episodes of at least 10 minutes, and preferably, it should be spread throughout the week.    Try Daily Burn.  Behavioral modification strategies: decreasing simple carbohydrates and increasing vegetables.  Cassandra Campos has agreed to follow-up with our clinic in 2 weeks. She was informed of the importance of frequent follow-up visits to maximize her success with intensive lifestyle modifications for her multiple health conditions.   Objective:   Blood pressure (!) 146/80, pulse 94, temperature 98.2 F (36.8 C), temperature source Oral, height 5\' 3"  (1.6 m), weight 228 lb (103.4 kg), SpO2 98 %. Body mass index is 40.39 kg/m.  General: Cooperative, alert, well developed, in no acute distress. HEENT: Conjunctivae and lids unremarkable. Cardiovascular: Regular rhythm.  Lungs: Normal work of breathing. Neurologic: No focal deficits.   Lab  Results  Component Value Date   CREATININE 5.58 (H) 07/17/2019   BUN 35 (H) 07/17/2019   NA 136 07/17/2019   K 3.3 (L) 07/17/2019   CL 95 (L) 07/17/2019   CO2 26 07/17/2019   Lab Results  Component Value Date   ALT 32 07/17/2019   AST 27 07/17/2019   ALKPHOS 52 07/17/2019   BILITOT 0.8 07/17/2019   Lab Results  Component Value Date   HGBA1C 8.7 (A) 09/11/2019   HGBA1C 10.9 (H) 04/19/2019   HGBA1C 6.9 (H) 11/09/2018   HGBA1C 7.1 (H) 07/11/2018   HGBA1C 8.5 (H) 03/27/2018   Lab Results  Component Value Date   TSH 2.360 11/09/2018   Lab Results  Component Value Date   CHOL 192 08/07/2019   HDL 49 08/07/2019   LDLCALC 96 08/07/2019   TRIG 280 (H) 08/07/2019   CHOLHDL 3.9 08/07/2019   Lab Results  Component Value Date   WBC 7.4 07/17/2019   HGB 10.7 (L) 07/17/2019   HCT 31.3 (L) 07/17/2019   MCV 89.9 07/17/2019   PLT 153 07/17/2019   Lab Results  Component Value Date   IRON 56 03/15/2017   IRON 56 03/15/2017   FERRITIN 404.4 (H) 03/15/2017   Attestation Statements:   Reviewed by clinician on day of visit: allergies, medications, problem list, medical history, surgical history, family history, social history, and previous encounter notes.  I, Water quality scientist, CMA, am  acting as Location manager for [employer], DO.  I have reviewed the above documentation for accuracy and completeness, and I agree with the above. Briscoe Deutscher, DO

## 2019-09-14 DIAGNOSIS — E1122 Type 2 diabetes mellitus with diabetic chronic kidney disease: Secondary | ICD-10-CM | POA: Diagnosis not present

## 2019-09-14 DIAGNOSIS — I953 Hypotension of hemodialysis: Secondary | ICD-10-CM | POA: Diagnosis not present

## 2019-09-14 DIAGNOSIS — Z992 Dependence on renal dialysis: Secondary | ICD-10-CM | POA: Diagnosis not present

## 2019-09-14 DIAGNOSIS — N186 End stage renal disease: Secondary | ICD-10-CM | POA: Diagnosis not present

## 2019-09-14 DIAGNOSIS — N2581 Secondary hyperparathyroidism of renal origin: Secondary | ICD-10-CM | POA: Diagnosis not present

## 2019-09-14 DIAGNOSIS — T82898A Other specified complication of vascular prosthetic devices, implants and grafts, initial encounter: Secondary | ICD-10-CM | POA: Diagnosis not present

## 2019-09-15 DIAGNOSIS — G4733 Obstructive sleep apnea (adult) (pediatric): Secondary | ICD-10-CM | POA: Diagnosis not present

## 2019-09-17 DIAGNOSIS — Z992 Dependence on renal dialysis: Secondary | ICD-10-CM | POA: Diagnosis not present

## 2019-09-17 DIAGNOSIS — N2581 Secondary hyperparathyroidism of renal origin: Secondary | ICD-10-CM | POA: Diagnosis not present

## 2019-09-17 DIAGNOSIS — N186 End stage renal disease: Secondary | ICD-10-CM | POA: Diagnosis not present

## 2019-09-17 DIAGNOSIS — I953 Hypotension of hemodialysis: Secondary | ICD-10-CM | POA: Diagnosis not present

## 2019-09-18 DIAGNOSIS — Z992 Dependence on renal dialysis: Secondary | ICD-10-CM | POA: Diagnosis not present

## 2019-09-18 DIAGNOSIS — N186 End stage renal disease: Secondary | ICD-10-CM | POA: Diagnosis not present

## 2019-09-18 DIAGNOSIS — N2581 Secondary hyperparathyroidism of renal origin: Secondary | ICD-10-CM | POA: Diagnosis not present

## 2019-09-18 DIAGNOSIS — I953 Hypotension of hemodialysis: Secondary | ICD-10-CM | POA: Diagnosis not present

## 2019-09-20 ENCOUNTER — Other Ambulatory Visit: Payer: Self-pay | Admitting: Internal Medicine

## 2019-09-20 DIAGNOSIS — N186 End stage renal disease: Secondary | ICD-10-CM | POA: Diagnosis not present

## 2019-09-20 DIAGNOSIS — I953 Hypotension of hemodialysis: Secondary | ICD-10-CM | POA: Diagnosis not present

## 2019-09-20 DIAGNOSIS — N2581 Secondary hyperparathyroidism of renal origin: Secondary | ICD-10-CM | POA: Diagnosis not present

## 2019-09-20 DIAGNOSIS — Z992 Dependence on renal dialysis: Secondary | ICD-10-CM | POA: Diagnosis not present

## 2019-09-20 NOTE — Telephone Encounter (Signed)
Patient is calling in requesting a refill of PREGABALIN 100MG  CAPSULE  Last OV- 08/07/19 Next OV- 11/07/19

## 2019-09-20 NOTE — Telephone Encounter (Signed)
Pls clarify how she takes Lyrica. It states 100 qam along with 75 qam. I do not think that is correct.

## 2019-09-21 ENCOUNTER — Other Ambulatory Visit: Payer: Self-pay | Admitting: Internal Medicine

## 2019-09-21 DIAGNOSIS — N2581 Secondary hyperparathyroidism of renal origin: Secondary | ICD-10-CM | POA: Diagnosis not present

## 2019-09-21 DIAGNOSIS — Z992 Dependence on renal dialysis: Secondary | ICD-10-CM | POA: Diagnosis not present

## 2019-09-21 DIAGNOSIS — N186 End stage renal disease: Secondary | ICD-10-CM | POA: Diagnosis not present

## 2019-09-21 DIAGNOSIS — I953 Hypotension of hemodialysis: Secondary | ICD-10-CM | POA: Diagnosis not present

## 2019-09-21 DIAGNOSIS — G4733 Obstructive sleep apnea (adult) (pediatric): Secondary | ICD-10-CM | POA: Diagnosis not present

## 2019-09-21 NOTE — Telephone Encounter (Signed)
So change the directions on the medication please so it reflects that

## 2019-09-21 NOTE — Telephone Encounter (Signed)
The pt said she takes the 100 mg in the morning and the 75 mg  at night of the Pregablin.

## 2019-09-21 NOTE — Telephone Encounter (Signed)
The directions for the pregablin has now been changed.

## 2019-09-24 DIAGNOSIS — N186 End stage renal disease: Secondary | ICD-10-CM | POA: Diagnosis not present

## 2019-09-24 DIAGNOSIS — N2581 Secondary hyperparathyroidism of renal origin: Secondary | ICD-10-CM | POA: Diagnosis not present

## 2019-09-24 DIAGNOSIS — Z992 Dependence on renal dialysis: Secondary | ICD-10-CM | POA: Diagnosis not present

## 2019-09-24 DIAGNOSIS — I953 Hypotension of hemodialysis: Secondary | ICD-10-CM | POA: Diagnosis not present

## 2019-09-25 DIAGNOSIS — Z992 Dependence on renal dialysis: Secondary | ICD-10-CM | POA: Diagnosis not present

## 2019-09-25 DIAGNOSIS — I953 Hypotension of hemodialysis: Secondary | ICD-10-CM | POA: Diagnosis not present

## 2019-09-25 DIAGNOSIS — N2581 Secondary hyperparathyroidism of renal origin: Secondary | ICD-10-CM | POA: Diagnosis not present

## 2019-09-25 DIAGNOSIS — N186 End stage renal disease: Secondary | ICD-10-CM | POA: Diagnosis not present

## 2019-09-27 DIAGNOSIS — Z992 Dependence on renal dialysis: Secondary | ICD-10-CM | POA: Diagnosis not present

## 2019-09-27 DIAGNOSIS — N186 End stage renal disease: Secondary | ICD-10-CM | POA: Diagnosis not present

## 2019-09-27 DIAGNOSIS — N2581 Secondary hyperparathyroidism of renal origin: Secondary | ICD-10-CM | POA: Diagnosis not present

## 2019-09-27 DIAGNOSIS — I953 Hypotension of hemodialysis: Secondary | ICD-10-CM | POA: Diagnosis not present

## 2019-09-28 DIAGNOSIS — Z992 Dependence on renal dialysis: Secondary | ICD-10-CM | POA: Diagnosis not present

## 2019-09-28 DIAGNOSIS — I953 Hypotension of hemodialysis: Secondary | ICD-10-CM | POA: Diagnosis not present

## 2019-09-28 DIAGNOSIS — N2581 Secondary hyperparathyroidism of renal origin: Secondary | ICD-10-CM | POA: Diagnosis not present

## 2019-09-28 DIAGNOSIS — N186 End stage renal disease: Secondary | ICD-10-CM | POA: Diagnosis not present

## 2019-10-01 DIAGNOSIS — I953 Hypotension of hemodialysis: Secondary | ICD-10-CM | POA: Diagnosis not present

## 2019-10-01 DIAGNOSIS — N2581 Secondary hyperparathyroidism of renal origin: Secondary | ICD-10-CM | POA: Diagnosis not present

## 2019-10-01 DIAGNOSIS — Z992 Dependence on renal dialysis: Secondary | ICD-10-CM | POA: Diagnosis not present

## 2019-10-01 DIAGNOSIS — N186 End stage renal disease: Secondary | ICD-10-CM | POA: Diagnosis not present

## 2019-10-02 DIAGNOSIS — I953 Hypotension of hemodialysis: Secondary | ICD-10-CM | POA: Diagnosis not present

## 2019-10-02 DIAGNOSIS — N2581 Secondary hyperparathyroidism of renal origin: Secondary | ICD-10-CM | POA: Diagnosis not present

## 2019-10-02 DIAGNOSIS — Z992 Dependence on renal dialysis: Secondary | ICD-10-CM | POA: Diagnosis not present

## 2019-10-02 DIAGNOSIS — N186 End stage renal disease: Secondary | ICD-10-CM | POA: Diagnosis not present

## 2019-10-03 ENCOUNTER — Ambulatory Visit (INDEPENDENT_AMBULATORY_CARE_PROVIDER_SITE_OTHER): Payer: BC Managed Care – PPO | Admitting: Family Medicine

## 2019-10-04 DIAGNOSIS — I953 Hypotension of hemodialysis: Secondary | ICD-10-CM | POA: Diagnosis not present

## 2019-10-04 DIAGNOSIS — N2581 Secondary hyperparathyroidism of renal origin: Secondary | ICD-10-CM | POA: Diagnosis not present

## 2019-10-04 DIAGNOSIS — N186 End stage renal disease: Secondary | ICD-10-CM | POA: Diagnosis not present

## 2019-10-04 DIAGNOSIS — Z992 Dependence on renal dialysis: Secondary | ICD-10-CM | POA: Diagnosis not present

## 2019-10-05 DIAGNOSIS — N186 End stage renal disease: Secondary | ICD-10-CM | POA: Diagnosis not present

## 2019-10-05 DIAGNOSIS — N2581 Secondary hyperparathyroidism of renal origin: Secondary | ICD-10-CM | POA: Diagnosis not present

## 2019-10-05 DIAGNOSIS — I953 Hypotension of hemodialysis: Secondary | ICD-10-CM | POA: Diagnosis not present

## 2019-10-05 DIAGNOSIS — Z992 Dependence on renal dialysis: Secondary | ICD-10-CM | POA: Diagnosis not present

## 2019-10-08 ENCOUNTER — Encounter: Payer: Self-pay | Admitting: Neurology

## 2019-10-08 DIAGNOSIS — Z992 Dependence on renal dialysis: Secondary | ICD-10-CM | POA: Diagnosis not present

## 2019-10-08 DIAGNOSIS — N2581 Secondary hyperparathyroidism of renal origin: Secondary | ICD-10-CM | POA: Diagnosis not present

## 2019-10-08 DIAGNOSIS — I953 Hypotension of hemodialysis: Secondary | ICD-10-CM | POA: Diagnosis not present

## 2019-10-08 DIAGNOSIS — N186 End stage renal disease: Secondary | ICD-10-CM | POA: Diagnosis not present

## 2019-10-09 ENCOUNTER — Ambulatory Visit (INDEPENDENT_AMBULATORY_CARE_PROVIDER_SITE_OTHER): Payer: BC Managed Care – PPO | Admitting: Neurology

## 2019-10-09 ENCOUNTER — Other Ambulatory Visit: Payer: Self-pay

## 2019-10-09 ENCOUNTER — Encounter: Payer: Self-pay | Admitting: Neurology

## 2019-10-09 VITALS — BP 134/76 | HR 85 | Ht 63.0 in | Wt 231.0 lb

## 2019-10-09 DIAGNOSIS — D631 Anemia in chronic kidney disease: Secondary | ICD-10-CM

## 2019-10-09 DIAGNOSIS — I953 Hypotension of hemodialysis: Secondary | ICD-10-CM | POA: Diagnosis not present

## 2019-10-09 DIAGNOSIS — G4710 Hypersomnia, unspecified: Secondary | ICD-10-CM | POA: Diagnosis not present

## 2019-10-09 DIAGNOSIS — N186 End stage renal disease: Secondary | ICD-10-CM

## 2019-10-09 DIAGNOSIS — I150 Renovascular hypertension: Secondary | ICD-10-CM

## 2019-10-09 DIAGNOSIS — N2581 Secondary hyperparathyroidism of renal origin: Secondary | ICD-10-CM | POA: Diagnosis not present

## 2019-10-09 DIAGNOSIS — G4733 Obstructive sleep apnea (adult) (pediatric): Secondary | ICD-10-CM

## 2019-10-09 DIAGNOSIS — Z992 Dependence on renal dialysis: Secondary | ICD-10-CM | POA: Diagnosis not present

## 2019-10-09 DIAGNOSIS — G4719 Other hypersomnia: Secondary | ICD-10-CM | POA: Diagnosis not present

## 2019-10-09 DIAGNOSIS — I1310 Hypertensive heart and chronic kidney disease without heart failure, with stage 1 through stage 4 chronic kidney disease, or unspecified chronic kidney disease: Secondary | ICD-10-CM | POA: Insufficient documentation

## 2019-10-09 DIAGNOSIS — N185 Chronic kidney disease, stage 5: Secondary | ICD-10-CM

## 2019-10-09 DIAGNOSIS — Z9989 Dependence on other enabling machines and devices: Secondary | ICD-10-CM

## 2019-10-09 MED ORDER — ARMODAFINIL 150 MG PO TABS: 150.0000 mg | ORAL_TABLET | Freq: Every day | ORAL | 5 refills | Status: DC

## 2019-10-09 NOTE — Patient Instructions (Signed)
Armodafinil tablets What is this medicine? ARMODAFINIL (ar moe DAF i nil) is used to treat excessive sleepiness caused by certain sleep disorders. This includes narcolepsy, sleep apnea, and shift work sleep disorder. This medicine may be used for other purposes; ask your health care provider or pharmacist if you have questions. COMMON BRAND NAME(S): Nuvigil What should I tell my health care provider before I take this medicine? They need to know if you have any of these conditions:  bipolar disorder  depression  drug or alcohol abuse or addiction  heart disease  high blood pressure  kidney disease  liver disease  schizophrenia  suicidal thoughts, plans, or attempt; a previous suicide attempt by you or a family member  an unusual or allergic reaction to armodafinil, modafinil, medicines, foods, dyes, or preservatives  pregnant or trying to get pregnant  breast-feeding How should I use this medicine? Take this medicine by mouth with a glass of water. Follow the directions on the prescription label. Take your doses at regular intervals. Do not take your medicine more often than directed. Do not stop taking this medicine suddenly except upon the advice of your doctor. Stopping this medicine too quickly may cause serious side effects or your condition may worsen. A special MedGuide will be given to you by the pharmacist with each prescription and refill. Be sure to read this information carefully each time. Talk to your pediatrician regarding the use of this medicine in children. While this drug may be prescribed for children as young as 17 years of age for selected conditions, precautions do apply. Overdosage: If you think you have taken too much of this medicine contact a poison control center or emergency room at once. NOTE: This medicine is only for you. Do not share this medicine with others. What if I miss a dose? If you miss a dose, take it as soon as you can. If it is almost  time for your next dose, take only that dose. Do not take double or extra doses. What may interact with this medicine? Do not take this medicine with any of the following medications:  amphetamine or dextroamphetamine  dexmethylphenidate or methylphenidate  MAOIs like Carbex, Eldepryl, Marplan, Nardil, and Parnate  pemoline  procarbazine This medicine may also interact with the following medications:  antifungal medicines like itraconazole or ketoconazole  barbiturates, like phenobarbital  birth control pills or other hormone-containing birth control devices or implants  carbamazepine  cyclosporine  diazepam  medicines for depression, anxiety, or psychotic disturbances  phenytoin  propranolol  triazolam  warfarin This list may not describe all possible interactions. Give your health care provider a list of all the medicines, herbs, non-prescription drugs, or dietary supplements you use. Also tell them if you smoke, drink alcohol, or use illegal drugs. Some items may interact with your medicine. What should I watch for while using this medicine? Visit your doctor or healthcare provider for regular checks on your progress. The full effect of this medicine may not be seen right away. This medicine may affect your concentration, function, or may hide signs that you are tired. You may get dizzy. This medicine will not eliminate your abnormal tendency to fall asleep and is not a replacement for sleep. Do not change your previous behavior regarding potentially dangerous activities, such as driving, using machinery, or doing anything that needs mental alertness until you know how this drug affects you. Alcohol can make you more dizzy and may interfere with your response to this medicine or   your alertness. Avoid alcoholic drinks. This medicine may cause serious skin reactions. They can happen weeks to months after starting the medicine. Contact your healthcare provider right away if  you notice fevers or flu-like symptoms with a rash. The rash may be red or purple and then turn into blisters or peeling of the skin. Or, you might notice a red rash with swelling of the face, lips, or lymph nodes in your neck or under your arms. Birth control pills may not work properly while you are taking this medicine. While using birth control pills, you will need an additional barrier method or an alternative non-hormonal method of birth control during treatment with armodafinil and for 1 month after stopping armodafinil. Talk to your doctor about which extra method of birth control is right for you. It is unknown if the effects of this medicine will be increased by the use of caffeine. Caffeine is found in many foods, beverages, and medications. Ask your doctor if you should limit or change your intake of caffeine-containing products while on this medicine. Do not stop previously prescribed treatments for your condition, such as a CPAP machine, except on the advise of your physician or healthcare provider. What side effects may I notice from receiving this medicine? Side effects that you should report to your doctor or health care professional as soon as possible:  allergic reactions like skin rash, itching or hives, swelling of the face, lips, or tongue  anxiety  breathing or swallowing problems  chest pain  depressed mood  elevated mood, decreased need for sleep, racing thoughts, impulsive behavior  fast, irregular heartbeat  hallucination, loss of contact with reality  increased blood pressure  mouth sores, blisters, or peeling skin  rash, fever, and swollen lymph nodes  redness, blistering, peeling, or loosening of the skin, including inside the mouth  sore throat, fever, or chills  suicidal thoughts or other mood changes  tremors  vomiting Side effects that usually do not require medical attention (report to your doctor or health care professional if they continue or  are bothersome):  headache  nausea, diarrhea, or stomach upset  nervousness  trouble sleeping This list may not describe all possible side effects. Call your doctor for medical advice about side effects. You may report side effects to FDA at 1-800-FDA-1088. Where should I keep my medicine? Keep out of the reach of children. Store at room temperature between 20 and 25 degrees C (68 and 77 degrees F). Throw away any unused medicine after the expiration date. NOTE: This sheet is a summary. It may not cover all possible information. If you have questions about this medicine, talk to your doctor, pharmacist, or health care provider.  2020 [employer]/Gold Standard (2018-07-25 10:01:22)  

## 2019-10-09 NOTE — Progress Notes (Signed)
[place]   Provider:  Larey Seat, M D  Primary Care Physician:  Glendale Chard, MD   Referring Provider:  ED   10/09/19 at  3:30 PM-  Cassandra Campos is a 49 y.o. female patient with a history of obstructive sleep apnea, CKD stage IV with renovascular hypertension, DM,  and OSA -manifesting excessive daytime sleepiness when seen in her  last 2 virtual visits.   Home sleep test confirmed a diagnosis of OSA- at the patient has given CPAP a good trial.  She now reports that she feels really much better when she is regularly using the device and she had noted it just recently when she ran out of this elevated water how much less restorative sleep was for that night.  She has been 80% compliant has used the machine 24 out of 30 points based, with an average use at time of 5 hours and 41 minutes the AutoSet has a minimum pressure of 6 maximum pressure of 16 cmH2O was 2 cm EPR her residual apnea-hypopnea index is only 0.5/h.   She has a dry mouth in the morning, and she is wondering if she should use a FFM . This is an excellent resolution of apnea the 95th percentile pressure is 14.8 and there are no central apneas arising and there is no major air leakage noted.   I think she may actually do better if we allow her maximum pressure to be raised from 16-18.  In addition I have reviewed her Epworth Sleepiness Scale which is still at 18 out of 24 indicating that she is indeed a sleepy individual and she has a high degree of fatigue.  Resistance and is treated with hemodialysis she has an AV fistula in her left arm for access, her attending is Dr. Jenny Reichmann Deterding.   Patient was seen on video today for the second time, the first visit was to addressed EDS, now she follows up after an ED visit, she had 8 days of facial droop and numbness. Date of ED visit  09-20-2018, she reported the right facial droop and numbness of the right tongue recovered by now. She felt very dizzy.  She  underwent a head CT at the hospital. This was normal. After starting valtrex she  recovered. Because of DM she was not given steroids.    49 year old female with history of hypertension, diabetes, ESRD on dialysis who presents to the emergency department with 4 days of right facial weakness.  On exam patient has right facial droop which includes forehead.  No other focal deficits noted on exam.  Patient is high risk for CVA however this presentation is consistent with Bell's palsy.  No parotid mass noted.  No vesicular rash concerning for shingles or HSV. The patient is safe for discharge with strict return precautions EKG: None  Sleep study : I had the pleasure of meeting this patient before referred by Dr. Gertie Exon her primary care physician.  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  Good productivity had suffered she make more mistakes she was called by her M workers to be dozing off.  She had endorsed the Epworth score at the time at 16 points.  She then returned for another visit on June 07, 2016 this time she had developed CKD stage IV related to diabetes-hypertension, we discussed a polysomnography which she had undergone on 10-20-15 when she  was diagnosed with rather mild apnea at an AHI of 10.3/h but strong REM sleep exacerbation.  Her REM sleep AHI was 47.2.  Equally strong was the exacerbation with supine sleep position.  She was hypoxemic for a very long time to at 61 minutes.  A CPAP titration was strongly recommended which she underwent on 27 November 2015 when her apnea index was reduced to 3.1.  At the time of the very first examination she was a shift worker working night shifts when I finally saw her the last time she had lost her job and while she had reported that initially CPAP worked well for her she was not longer able to afford supplies.  Now on 22 August 2018 she presents with heart  flutter, palpitations, and she has reached end-stage renal disease, now in treatment on hemodialysis on [facility].  Her nephrologist is Dr.Deterding . She has now anemia. Her insomnia has improved but her daytime sleepiness has gotten worse.  Since she has reached end-stage renal disease she has no nocturia anymore tachycardia seems to be more prevalent on days of nondialysis.  Her family has noted that she snores loudly she reports morning headaches, suffering from gout, hypertension, morbid obesity and diabetes.  She denies waking up with a dry mouth.  She does have a slight tremor in both hands but not a flapping tremor.  Sleep habits: dinnertime for this patient is between 630 and 7 PM but she is not in bed before midnight usually between midnight and 1 AM she states it is easier for her to fall asleep if she goes to bed later.  She states that she is restless at night she sleeps on her right side on one pillow on Monday, Wednesday, Friday she has hemodialysis appointments.  She rises at 6 AM having only 5 hours of sleep on those nights and then feels sleep deprived.  She works from 8 AM to 6 PM on her nondialysis days.  During hemodialysis she naps.    An Epworth sleepiness score of 16 out of 24 points is very high and certainly interferes with functioning in social as well as professional capacities.  I would like for her to try to advance her bedtime 5 hours of sleep allocated are just not enough to function.  In addition I have no doubt that the patient has obstructive sleep apnea and that the sleep apnea may have gotten worse over the last years.  Her comorbidities of morbid obesity, diabetes, hypertension, gout and the observed snoring and morning headaches all speak for it.  Since he is on hemodialysis her home sleep test should be performed at that time of a nondialysis day.  Her dry weight will be lower after dialysis treatment and will not reflect the worst possible apnea.  I would like for  her to have the home sleep test therefore on a nondialysis day and we will then initiate further treatment.  I was not sure if the patient has atrial fibrillation but this is certainly another complicating factor while being on hemodialysis and while having apneas that are not super completely treated.   She always had tachycardia at baseline but she felt palpitations more frequently in the last 6 months.    Review of Systems: Out of a complete 14 system review, the patient complains of only the following symptoms, and all other reviewed systems are negative.   How likely are you to doze in the following situations: 0 = not likely, 1 =  slight chance, 2 = moderate chance, 3 = high chance  Sitting and Reading? Watching Television? Sitting inactive in a public place (theater or meeting)? Lying down in the afternoon when circumstances permit? Sitting and talking to someone? Sitting quietly after lunch without alcohol? In a car, while stopped for a few minutes in traffic? As a passenger in a car for an hour without a break?  Total = 18/ 24 while using  CPAP .  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   She is on the renal transplant list.   Social History   Socioeconomic History   Marital status: Married    Spouse name: Not on file   Number of children: 2   Years of education: Not on file   Highest education level: Not on file  Occupational History   Occupation: medical billing and insurance  Tobacco Use   Smoking status: Never Smoker   Smokeless tobacco: Never Used  Substance and Sexual Activity   Alcohol use: Never    Alcohol/week: 0.0 standard drinks   Drug use: No   Sexual activity: Yes    Birth control/protection: Surgical    Comment: HYST-1st intercourse 49 yo-Fewer than 5 partners  Other Topics Concern   Not on file  Social History Narrative    Not on file   Social Determinants of Health   Financial Resource Strain:    Difficulty of Paying Living Expenses:   Food Insecurity:    Worried About Charity fundraiser in the Last Year:    Arboriculturist in the Last Year:   Transportation Needs:    Film/video editor (Medical):    Lack of Transportation (Non-Medical):   Physical Activity:    Days of Exercise per Week:    Minutes of Exercise per Session:   Stress:    Feeling of Stress :   Social Connections:    Frequency of Communication with Friends and Family:    Frequency of Social Gatherings with Friends and Family:    Attends Religious Services:    Active Member of Clubs or Organizations:    Attends Music therapist:    Marital Status:   Intimate Partner Violence:    Fear of Current or Ex-Partner:    Emotionally Abused:    Physically Abused:    Sexually Abused:     Family History  Problem Relation Age of Onset   Diabetes Mother    Hypertension Mother    Thyroid disease Mother    Diabetes Father    Hypertension Father    Cancer Father        STOMACH   Stomach cancer Father    Stroke Maternal Grandmother    Stroke Maternal Grandfather    Colon cancer Neg Hx    Rectal cancer Neg Hx     Past Medical History:  Diagnosis Date   Anemia associated with chronic renal failure    ASCUS of cervix with negative high risk HPV 06/2017   Back pain    Chest pain    CHF (congestive heart failure) ([place])    CKD (chronic kidney disease), stage IV ([place]) no dialysis yet   nephrologist-  dr Jamal Maes-- scheduled next visit 09-08-2017 (lov 07-05-2017)   Constipation    Diabetic retinopathy ([place])    Dialysis patient ([place])    Edema, lower extremity  Elevated transaminase level    Fatty liver    FOLLOWED BY DR Henrene Pastor   GERD (gastroesophageal reflux disease)    Hypertension    Idiopathic gout of multiple sites    09-05-2017 per pt stable   Insulin  dependent type 2 diabetes mellitus ([place])    followed by dr Toy Care   Joint pain    Mixed hyperlipidemia    OSA (obstructive sleep apnea)    per study 10-20-2015 mild osa w/ AHI 10.3/hr;  09-05-2017 per pt has not used cpap since 1 yr ago   Palpitations    Peripheral neuropathy    feet   S/P arteriovenous (AV) fistula creation 07-04-2015  w/ revision 02-14-2017   left braniocephilic   Shortness of breath    Trigger thumb, right thumb    Umbilical hernia    Vitamin D deficiency     Past Surgical History:  Procedure Laterality Date   ABDOMINAL HYSTERECTOMY  07/11/2000   dr gott   TAH/BSO for irregular bleeding and leiomyoma   AV FISTULA PLACEMENT Left 07/04/2015   Procedure: ARTERIOVENOUS (AV) FISTULA CREATION-LEFT;  Surgeon: Mal Misty, MD;  Location: [facility];  Service: Vascular;  Laterality: Left;   BREAST EXCISIONAL BIOPSY Right    benign   CARPAL TUNNEL RELEASE Bilateral 1993 and [name]:  09-25-1999   BILATERAL TUBAL LIGATION W/ LAST C/S   DILATION AND CURETTAGE OF UTERUS     EXPLORATORY [address] / LYSIS ADHESIONS/  BILATERAL SALPINGOOPHORECTOMY  07-20-2011  dr s. Rhodia Albright  [facility]   FISTULA SUPERFICIALIZATION Left 08/28/2015   Procedure: BRACHIOCEPHALIC FISTULA SUPERFICIALIZATION;  Surgeon: Mal Misty, MD;  Location: [facility];  Service: Vascular;  Laterality: Left;   PATCH ANGIOPLASTY Left 02/14/2017   Procedure: PATCH ANGIOPLASTY;  Surgeon: Elam Dutch, MD;  Location: [facility];  Service: Vascular;  Laterality: Left;   PELVIC LAPAROSCOPY  1994   exploration for ectopic preg.   RETINAL DETACHMENT SURGERY Left 2015   REVISON OF ARTERIOVENOUS FISTULA Left 02/14/2017   Procedure: REVISON OF ARTERIOVENOUS FISTULA  LEFT ARM;  Surgeon: Elam Dutch, MD;  Location: [facility];  Service: Vascular;  Laterality: Left;   TRIGGER FINGER RELEASE Left 2015   thumb   TRIGGER FINGER RELEASE Right 09/09/2017   Procedure: RELEASE TRIGGER FINGER/A-1  PULLEY RIGHT THUMB;  Surgeon: Dorna Leitz, MD;  Location: [facility];  Service: Orthopedics;  Laterality: Right;    Current Outpatient Medications  Medication Sig Dispense Refill   allopurinol (ZYLOPRIM) 300 MG tablet Take 1 tablet (300 mg total) by mouth every morning. 90 tablet 1   B Complex-C-Folic Acid (RENAL) 1 MG CAPS TAKE 1 CAPSULE BY MOUTH DAILY (ON DIALYSIS DAYS, TAKE AFTER DIALYSIS TREATMENT )     BELSOMRA 15 MG TABS Take 1 tablet by mouth at bedtime as needed.      benzonatate (TESSALON PERLES) 100 MG capsule Take 1 capsule (100 mg total) by mouth 3 (three) times daily as needed for cough. 30 capsule 1   calcitRIOL (ROCALTROL) 0.5 MCG capsule Take 0.5 mcg by mouth daily.     cinacalcet (SENSIPAR) 60 MG tablet Take 60 mg by mouth daily. Every other day     cromolyn (OPTICROM) 4 % ophthalmic solution 1 drop 4 (four) times daily.     HUMULIN R 500 UNIT/ML injection Inject under skin up to 200 units in the morning and up to 150 units in the evening 20 mL 11  Insulin Pen Needle 32G X 4 MM MISC Use 2x a day 200 each 3   lanthanum (FOSRENOL) 1000 MG chewable tablet Chew 1,000 mg by mouth 3 (three) times daily with meals.     lidocaine-prilocaine (EMLA) cream USE ON ARAMS AS NEEDED FOR DIALYSIS  10   Microlet Lancets MISC Use as directed to check blood sugars 4 times per day dx: e11.22 300 each 2   MITIGARE 0.6 MG CAPS TAKE ONE CAPSULE BY MOUTH ONCE DAILY 90 capsule 0   neomycin-polymyxin-hydrocortisone (CORTISPORIN) OTIC solution Apply 1-2 drops to the toe after soaking toe twice a day 10 mL 0   pregabalin (LYRICA) 100 MG capsule TAKE 1 CAPSULE BY MOUTH IN THE MORNING FOR NEUROPATHY 90 capsule 1   pregabalin (LYRICA) 75 MG capsule Take one capsule by mouth at bedtime 90 capsule 1   RESTASIS 0.05 % ophthalmic emulsion INSTILL 1 DROP INTO BOTH EYES TWICE A DAY  3   rosuvastatin (CRESTOR) 20 MG tablet TAKE 1 TABLET BY MOUTH EVERY DAY 90 tablet 1    Semaglutide, 1 MG/DOSE, (OZEMPIC, 1 MG/DOSE,) 4 MG/3ML SOPN Inject 1 mg into the skin once a week. 9 mL 3   No current facility-administered medications for this visit.    Allergies as of 10/09/2019   (No Known Allergies)    Last Weight:  Last Height:   General: The patient is awake, alert and appears not in acute distress. The patient is well groomed. Head: Normocephalic, atraumatic. Neck is supple without ROM restriction.  Mallampati grade :    Respiratory: Breath holding was possible for seconds. Skin:  Without evidence of facial edema or rash  Neurologic exam : The patient is awake and alert, oriented to place and time.   Attention span & concentration ability appears normal.  Speech is fluent, without  dysarthria, dysphonia or aphasia.  Mood and affect are appropriate.  Cranial nerves: Pupils are equal in size and round.  Extraocular movements  in vertical and horizontal planes intact. Facial motor strength is symmetric and tongue and uvula move midline. Shoulder shrug was symmetrical.   Motor exam:   Normal muscle bulk and symmetric ROM ( range of movement) in upper/ lower extremities.  Coordination: Rapid alternating movements in the fingers/hands were normal. Finger-to-nose maneuver demonstrated no evidence of ataxia, dysmetria or tremor.  Gait and station: Patient walks without assistive device -    Assessment and Plan:   1)   3) suspected bells palsy but patient reported dysequilibrium with onset of right facial droop, right tongue numbness. hearing and vision were unaffected. She had an ear pain on the right- evolving fast over 2-3 days and recovering over 10 days. She now has recovered from the facial droop.    I want to rule out a brainstem stroke, MRI without contrast ordered. This  49 year old AAF has been a high risk for vascular  events. AV fistula, ESRD on Hemodialysis.  Follow Up Instructions: We will call you with the results of the MRI/ MRA and have  your CPAP ready for you.    I discussed the assessment and treatment plan with the patient. The patient was provided an opportunity to ask questions and all were answered. The patient agreed with the plan and demonstrated an understanding of the instructions.   The patient was advised to call back or seek an in-person evaluation if the symptoms worsen or if the condition fails to improve as anticipated.  I provided 15 minutes of non-face-to-face time  during this encounter.  Larey Seat, MD 3/50/0938, 1:82 PM  Certified in Neurology by ABPN Certified in Sleep Medicine by [facility] [address] [place], [place] [address]  Last visit :  History of Present Illness:  I had the pleasure of meeting this patient before referred by Dr. Gertie Exon her primary care physician.  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  Good productivity had suffered she make more mistakes she was called by her M workers to be dozing off.  She had endorsed the Epworth score at the time at 16 points.  She then returned for another visit on June 07, 2016 this time she had developed CKD stage IV related to diabetes-hypertension, we discussed a polysomnography which she had undergone on 10-20-15 when she was diagnosed with rather mild apnea at an AHI of 10.3/h but strong REM sleep exacerbation.  Her REM sleep AHI was 47.2.  Equally strong was the exacerbation with supine sleep position.  She was hypoxemic for a very long time to at 61 minutes.  A CPAP titration was strongly recommended which she underwent on 27 November 2015 when her apnea index was reduced to 3.1.  At the time of the very first examination she was a shift worker working night shifts when I finally saw her the last time she had lost her job and while she had reported that initially CPAP worked well for her she was not longer able  to afford supplies.  Now on 22 August 2018 she presents with heart flutter, palpitations, and she has reached end-stage renal disease, now in treatment on hemodialysis on [facility].  Her nephrologist is Dr.Deterding . She has now anemia. Her insomnia has improved but her daytime sleepiness has gotten worse.  Since she has reached end-stage renal disease she has no nocturia anymore tachycardia seems to be more prevalent on days of nondialysis.  Her family has noted that she snores loudly she reports morning headaches, suffering from gout, hypertension, morbid obesity and diabetes.  She denies waking up with a dry mouth.  She does have a slight tremor in both hands but not a flapping tremor.  Sleep habits: dinnertime for this patient is between 630 and 7 PM but she is not in bed before midnight usually between midnight and 1 AM she states it is easier for her to fall asleep if she goes to bed later.  She states that she is restless at night she sleeps on her right side on one pillow on Monday, Wednesday, Friday she has hemodialysis appointments.  She rises at 6 AM having only 5 hours of sleep on those nights and then feels sleep deprived.  She works from 8 AM to 6 PM on her nondialysis days.  During hemodialysis she naps.    She no longer use CPAP since 2018 and her Epworth sleepiness score is now exacerbated to 16 out of 24 points.  Social history the patient is married, she is employed, she has a history of a C-section.  Denies any tobacco use never smoked, she does not drink, does not use caffeine.   Family history; her mother suffered from hypertension and diabetes as did her father her father also had stomach cancer both maternal grandparents had strokes.    Observations/Objective: Cassandra Campos has now a neck circumference of 18 inches a BMI of 39.5 at a weight of 230  pounds. She appears not short of breath and cannot complete sentences without being interrupted, there is no dysarthria or  dysphonia.  Her eye movements seem to be regular her pupils of equal size she does not have a facial droop.  Tongue and uvula were not easy to appreciate her neck circumference was 18 inches but her uvula was not visible.  I would score this is a Mallampati grade 4.  She was able to provide shoulder shrug and she can rotate laterally tilt flex and hyperextend her neck.  Her hands have as slight tremor which can be related to the end-stage renal disease.  She also had a fistula placed.  There may be some left hand clumsiness and weakness with grip strength due to this.  I did not examine the patient's gait, her ability to stand on one leg turn neither her balance or coordination any further.    Assessment and Plan: An Epworth sleepiness score of 16 out of 24 points is very high and certainly interferes with functioning in social as well as professional capacities.  I would like for her to try to advance her bedtime 5 hours of sleep allocated are just not enough to function.  In addition I have no doubt that the patient has obstructive sleep apnea and that the sleep apnea may have gotten worse over the last years.  Her comorbidities of morbid obesity, diabetes, hypertension, gout and the observed snoring and morning headaches all speak for it.  Since he is on hemodialysis her home sleep test should be performed at that time of a nondialysis day.  Her dry weight will be lower after dialysis treatment and will not reflect the worst possible apnea.  I would like for her to have the home sleep test therefore on a nondialysis day and we will then initiate further treatment.  I was not sure if the patient has atrial fibrillation but this is certainly another complicating factor while being on hemodialysis and while having apneas that are not super completely treated.   She always had tachycardia at baseline but she felt palpitations more frequently in the last 6 months.   Follow Up Instructions:  08-2018 , virtual  visit/   HST on a non- dialysis day.    SLEEP MEDICINE CLINIC   Provider:  Larey Seat, M D  Primary Care Physician:  Glendale Chard, MD   Referring Provider:  ED  Virtual Visit via Video Note  I connected with@ on 10/09/19 at  3:30 PM EDT by a video enabled telemedicine application and verified that I am speaking with the correct person using two identifiers.  Location: Patient: at work  Provider: [facility]   I discussed the limitations of evaluation and management by telemedicine and the availability of in person appointments. The patient expressed understanding and agreed to proceed.  Larey Seat, MD HPI:  Cassandra Campos is a 49 y.o. female and seen hon video today for the second time, the first visit was to addressed EDS, now she follows up after an ED visit, she had 8 days of facial droop and numbness. Date of ED visit  09-20-2018, she reported the right facial droop and numbness of the right tongue recovered by now. She felt very dizzy.  She underwent a head CT at the hospital. This was normal. After starting valtrex she  recovered. Because of DM she was not given steroids.    49 year old female with history of hypertension, diabetes, ESRD on dialysis who presents to  the emergency department with 4 days of right facial weakness.  On exam patient has right facial droop which includes forehead.  No other focal deficits noted on exam.  Patient is high risk for CVA however this presentation is consistent with Bell's palsy.  No parotid mass noted.  No vesicular rash concerning for shingles or HSV. The patient is safe for discharge with strict return precautions EKG: None   Review of Systems: Out of a complete 14 system review, the patient complains of only the following symptoms, and all other reviewed systems are negative.   How likely are you to doze in the following situations: 0 = not likely, 1 = slight chance, 2 = moderate chance, 3 = high chance  Sitting and  Reading? Watching Television? Sitting inactive in a public place (theater or meeting)? Lying down in the afternoon when circumstances permit? Sitting and talking to someone? Sitting quietly after lunch without alcohol? In a car, while stopped for a few minutes in traffic? As a passenger in a car for an hour without a break?  Total = 16/ 24 awaiting CPAP .  Social History   Socioeconomic History   Marital status: Married    Spouse name: Not on file   Number of children: 2   Years of education: Not on file   Highest education level: Not on file  Occupational History   Occupation: medical billing and insurance  Tobacco Use   Smoking status: Never Smoker   Smokeless tobacco: Never Used  Substance and Sexual Activity   Alcohol use: Never    Alcohol/week: 0.0 standard drinks   Drug use: No   Sexual activity: Yes    Birth control/protection: Surgical    Comment: HYST-1st intercourse 49 yo-Fewer than 5 partners  Other Topics Concern   Not on file  Social History Narrative   Not on file   Social Determinants of Health   Financial Resource Strain:    Difficulty of Paying Living Expenses:   Food Insecurity:    Worried About Charity fundraiser in the Last Year:    Arboriculturist in the Last Year:   Transportation Needs:    Film/video editor (Medical):    Lack of Transportation (Non-Medical):   Physical Activity:    Days of Exercise per Week:    Minutes of Exercise per Session:   Stress:    Feeling of Stress :   Social Connections:    Frequency of Communication with Friends and Family:    Frequency of Social Gatherings with Friends and Family:    Attends Religious Services:    Active Member of Clubs or Organizations:    Attends Music therapist:    Marital Status:   Intimate Partner Violence:    Fear of Current or Ex-Partner:    Emotionally Abused:    Physically Abused:    Sexually Abused:     Family History    Problem Relation Age of Onset   Diabetes Mother    Hypertension Mother    Thyroid disease Mother    Diabetes Father    Hypertension Father    Cancer Father        STOMACH   Stomach cancer Father    Stroke Maternal Grandmother    Stroke Maternal Grandfather    Colon cancer Neg Hx    Rectal cancer Neg Hx     Past Medical History:  Diagnosis Date   Anemia associated with chronic renal failure  ASCUS of cervix with negative high risk HPV 06/2017   Back pain    Chest pain    CHF (congestive heart failure) ([place])    CKD (chronic kidney disease), stage IV ([place]) no dialysis yet   nephrologist-  dr Jamal Maes-- scheduled next visit 09-08-2017 (lov 07-05-2017)   Constipation    Diabetic retinopathy ([place])    Dialysis patient ([place])    Edema, lower extremity    Elevated transaminase level    Fatty liver    FOLLOWED BY DR Henrene Pastor   GERD (gastroesophageal reflux disease)    Hypertension    Idiopathic gout of multiple sites    09-05-2017 per pt stable   Insulin dependent type 2 diabetes mellitus ([place])    followed by dr Toy Care   Joint pain    Mixed hyperlipidemia    OSA (obstructive sleep apnea)    per study 10-20-2015 mild osa w/ AHI 10.3/hr;  09-05-2017 per pt has not used cpap since 1 yr ago   Palpitations    Peripheral neuropathy    feet   S/P arteriovenous (AV) fistula creation 07-04-2015  w/ revision 02-14-2017   left braniocephilic   Shortness of breath    Trigger thumb, right thumb    Umbilical hernia    Vitamin D deficiency     Past Surgical History:  Procedure Laterality Date   ABDOMINAL HYSTERECTOMY  07/11/2000   dr gott   TAH/BSO for irregular bleeding and leiomyoma   AV FISTULA PLACEMENT Left 07/04/2015   Procedure: ARTERIOVENOUS (AV) FISTULA CREATION-LEFT;  Surgeon: Mal Misty, MD;  Location: [place];  Service: Vascular;  Laterality: Left;   BREAST EXCISIONAL BIOPSY Right    benign   CARPAL TUNNEL RELEASE Bilateral  1993 and [place]:  09-25-1999   BILATERAL TUBAL LIGATION W/ LAST C/S   DILATION AND CURETTAGE OF UTERUS     EXPLORATORY [place] / LYSIS ADHESIONS/  BILATERAL SALPINGOOPHORECTOMY  07-20-2011  dr s. Rhodia Albright  [facility]   FISTULA SUPERFICIALIZATION Left 08/28/2015   Procedure: BRACHIOCEPHALIC FISTULA SUPERFICIALIZATION;  Surgeon: Mal Misty, MD;  Location: [place];  Service: Vascular;  Laterality: Left;   PATCH ANGIOPLASTY Left 02/14/2017   Procedure: PATCH ANGIOPLASTY;  Surgeon: Elam Dutch, MD;  Location: [place];  Service: Vascular;  Laterality: Left;   PELVIC LAPAROSCOPY  1994   exploration for ectopic preg.   RETINAL DETACHMENT SURGERY Left 2015   REVISON OF ARTERIOVENOUS FISTULA Left 02/14/2017   Procedure: REVISON OF ARTERIOVENOUS FISTULA  LEFT ARM;  Surgeon: Elam Dutch, MD;  Location: [place];  Service: Vascular;  Laterality: Left;   TRIGGER FINGER RELEASE Left 2015   thumb   TRIGGER FINGER RELEASE Right 09/09/2017   Procedure: RELEASE TRIGGER FINGER/A-1 PULLEY RIGHT THUMB;  Surgeon: Dorna Leitz, MD;  Location: [place];  Service: Orthopedics;  Laterality: Right;    Current Outpatient Medications  Medication Sig Dispense Refill   allopurinol (ZYLOPRIM) 300 MG tablet Take 1 tablet (300 mg total) by mouth every morning. 90 tablet 1   B Complex-C-Folic Acid (RENAL) 1 MG CAPS TAKE 1 CAPSULE BY MOUTH DAILY (ON DIALYSIS DAYS, TAKE AFTER DIALYSIS TREATMENT )     BELSOMRA 15 MG TABS Take 1 tablet by mouth at bedtime as needed.      benzonatate (TESSALON PERLES) 100 MG capsule Take 1 capsule (100 mg total) by mouth 3 (three) times daily as needed for cough. 30 capsule 1   calcitRIOL (ROCALTROL)  0.5 MCG capsule Take 0.5 mcg by mouth daily.     cinacalcet (SENSIPAR) 60 MG tablet Take 60 mg by mouth daily. Every other day     cromolyn (OPTICROM) 4 % ophthalmic solution 1 drop 4 (four) times daily.     HUMULIN R 500 UNIT/ML injection  Inject under skin up to 200 units in the morning and up to 150 units in the evening 20 mL 11   Insulin Pen Needle 32G X 4 MM MISC Use 2x a day 200 each 3   lanthanum (FOSRENOL) 1000 MG chewable tablet Chew 1,000 mg by mouth 3 (three) times daily with meals.     lidocaine-prilocaine (EMLA) cream USE ON ARAMS AS NEEDED FOR DIALYSIS  10   Microlet Lancets MISC Use as directed to check blood sugars 4 times per day dx: e11.22 300 each 2   MITIGARE 0.6 MG CAPS TAKE ONE CAPSULE BY MOUTH ONCE DAILY 90 capsule 0   neomycin-polymyxin-hydrocortisone (CORTISPORIN) OTIC solution Apply 1-2 drops to the toe after soaking toe twice a day 10 mL 0   pregabalin (LYRICA) 100 MG capsule TAKE 1 CAPSULE BY MOUTH IN THE MORNING FOR NEUROPATHY 90 capsule 1   pregabalin (LYRICA) 75 MG capsule Take one capsule by mouth at bedtime 90 capsule 1   RESTASIS 0.05 % ophthalmic emulsion INSTILL 1 DROP INTO BOTH EYES TWICE A DAY  3   rosuvastatin (CRESTOR) 20 MG tablet TAKE 1 TABLET BY MOUTH EVERY DAY 90 tablet 1   Semaglutide, 1 MG/DOSE, (OZEMPIC, 1 MG/DOSE,) 4 MG/3ML SOPN Inject 1 mg into the skin once a week. 9 mL 3   No current facility-administered medications for this visit.    Allergies as of 10/09/2019   (No Known Allergies)    Last Weight:    Last Height:  Observation:  General: The patient is awake, alert and appears not in acute distress. The patient is well groomed. Head: Normocephalic, atraumatic. Neck is supple without ROM restriction.  Mallampati grade :    Respiratory: Breath holding was possible for seconds. Skin:  Without evidence of facial edema or rash, dry skin, well healed fistula.Marland Kitchen   BMI  232 pounds at a hight of 5.3"  Neurologic exam : The patient is awake and alert, oriented to place and time.   Attention span & concentration ability appears normal.  Speech is fluent, without  dysarthria, dysphonia or aphasia.  Mood and affect are appropriate.  Cranial nerves: Pupils are  equal in size and round. There is some ciliary injection. No loss of vision. Blurred vision.  Extraocular movements in vertical and horizontal planes intact.  Facial motor strength is symmetric and tongue and uvula move midline. Shoulder shrug was symmetrical.   Motor exam:   Normal muscle bulk and symmetric ROM ( range of movement) in upper/ lower extremities. She has an AV fistula in the left , non dominant , arm- Dr Kellie Simmering in 2018.   Coordination: Rapid alternating movements in the fingers/hands were normal. Finger-to-nose maneuver demonstrated no evidence of ataxia, dysmetria or tremor.  Gait and station: Patient walks without assistive device -     Assessment I appreciate Cassandra Campos's efforts been using the CPAP regularly and also giving her a chance to work.  She has seen a benefit also she is very fatigued but partially but can be attributed to end-stage renal disease while on hemodialysis and working.  She is making efforts to lose more weight which also will help her to get a renal  transplant. The high degree of fatigue kind of daytime sleepiness is still above average in spite of the more compliant use of CPAP and the residual AHI of only 0.5.  We discussed today that she has some air leakage problems and also variable airflow that dries out her mucus tissue.   My goal is to increase her maximum pressure by 1 cm water and to ask you to set the humidifier higher at home, I also will change her to a full facemask for which she has to be fitted in person my goal is that she is trying the mask on and reclines with the mask in place to see if the air seal is correct. As to fatigue and sleepiness we could also treat this with modafinil this is a atypical stimulant medication that can help her to stay awake at the job.  I would not take it for the dialysis treatment but for the daytime work related energy that she needs.  Also  Fatiguing/ contributing to EDS  is the renal anemia and low iron  levels, causing RLS.   RV with Np in 6 month.  Check iron levels from [facility] unit/ Dr. Jimmy Footman.  All sleep patient need EPOWRTH SCORE>  Check compliance and air-leakage.     Larey Seat, MD 11/03/3557, 7:41 PM  Certified in Neurology by ABPN Certified in [place] by [facility] [address], [place], [place] [address]

## 2019-10-11 DIAGNOSIS — I953 Hypotension of hemodialysis: Secondary | ICD-10-CM | POA: Diagnosis not present

## 2019-10-11 DIAGNOSIS — Z992 Dependence on renal dialysis: Secondary | ICD-10-CM | POA: Diagnosis not present

## 2019-10-11 DIAGNOSIS — N186 End stage renal disease: Secondary | ICD-10-CM | POA: Diagnosis not present

## 2019-10-11 DIAGNOSIS — N2581 Secondary hyperparathyroidism of renal origin: Secondary | ICD-10-CM | POA: Diagnosis not present

## 2019-10-12 DIAGNOSIS — Z992 Dependence on renal dialysis: Secondary | ICD-10-CM | POA: Diagnosis not present

## 2019-10-12 DIAGNOSIS — N2581 Secondary hyperparathyroidism of renal origin: Secondary | ICD-10-CM | POA: Diagnosis not present

## 2019-10-12 DIAGNOSIS — I953 Hypotension of hemodialysis: Secondary | ICD-10-CM | POA: Diagnosis not present

## 2019-10-12 DIAGNOSIS — N186 End stage renal disease: Secondary | ICD-10-CM | POA: Diagnosis not present

## 2019-10-15 DIAGNOSIS — N2581 Secondary hyperparathyroidism of renal origin: Secondary | ICD-10-CM | POA: Diagnosis not present

## 2019-10-15 DIAGNOSIS — N186 End stage renal disease: Secondary | ICD-10-CM | POA: Diagnosis not present

## 2019-10-15 DIAGNOSIS — I953 Hypotension of hemodialysis: Secondary | ICD-10-CM | POA: Diagnosis not present

## 2019-10-15 DIAGNOSIS — Z992 Dependence on renal dialysis: Secondary | ICD-10-CM | POA: Diagnosis not present

## 2019-10-15 DIAGNOSIS — E1122 Type 2 diabetes mellitus with diabetic chronic kidney disease: Secondary | ICD-10-CM | POA: Diagnosis not present

## 2019-10-16 DIAGNOSIS — N186 End stage renal disease: Secondary | ICD-10-CM | POA: Diagnosis not present

## 2019-10-16 DIAGNOSIS — N2581 Secondary hyperparathyroidism of renal origin: Secondary | ICD-10-CM | POA: Diagnosis not present

## 2019-10-16 DIAGNOSIS — Z992 Dependence on renal dialysis: Secondary | ICD-10-CM | POA: Diagnosis not present

## 2019-10-16 DIAGNOSIS — I953 Hypotension of hemodialysis: Secondary | ICD-10-CM | POA: Diagnosis not present

## 2019-10-18 DIAGNOSIS — N186 End stage renal disease: Secondary | ICD-10-CM | POA: Diagnosis not present

## 2019-10-18 DIAGNOSIS — I953 Hypotension of hemodialysis: Secondary | ICD-10-CM | POA: Diagnosis not present

## 2019-10-18 DIAGNOSIS — Z992 Dependence on renal dialysis: Secondary | ICD-10-CM | POA: Diagnosis not present

## 2019-10-18 DIAGNOSIS — N2581 Secondary hyperparathyroidism of renal origin: Secondary | ICD-10-CM | POA: Diagnosis not present

## 2019-10-19 DIAGNOSIS — Z992 Dependence on renal dialysis: Secondary | ICD-10-CM | POA: Diagnosis not present

## 2019-10-19 DIAGNOSIS — N2581 Secondary hyperparathyroidism of renal origin: Secondary | ICD-10-CM | POA: Diagnosis not present

## 2019-10-19 DIAGNOSIS — I953 Hypotension of hemodialysis: Secondary | ICD-10-CM | POA: Diagnosis not present

## 2019-10-19 DIAGNOSIS — N186 End stage renal disease: Secondary | ICD-10-CM | POA: Diagnosis not present

## 2019-10-21 DIAGNOSIS — G4733 Obstructive sleep apnea (adult) (pediatric): Secondary | ICD-10-CM | POA: Diagnosis not present

## 2019-10-22 DIAGNOSIS — Z992 Dependence on renal dialysis: Secondary | ICD-10-CM | POA: Diagnosis not present

## 2019-10-22 DIAGNOSIS — N186 End stage renal disease: Secondary | ICD-10-CM | POA: Diagnosis not present

## 2019-10-22 DIAGNOSIS — I953 Hypotension of hemodialysis: Secondary | ICD-10-CM | POA: Diagnosis not present

## 2019-10-22 DIAGNOSIS — N2581 Secondary hyperparathyroidism of renal origin: Secondary | ICD-10-CM | POA: Diagnosis not present

## 2019-10-23 DIAGNOSIS — N2581 Secondary hyperparathyroidism of renal origin: Secondary | ICD-10-CM | POA: Diagnosis not present

## 2019-10-23 DIAGNOSIS — N186 End stage renal disease: Secondary | ICD-10-CM | POA: Diagnosis not present

## 2019-10-23 DIAGNOSIS — Z992 Dependence on renal dialysis: Secondary | ICD-10-CM | POA: Diagnosis not present

## 2019-10-23 DIAGNOSIS — I953 Hypotension of hemodialysis: Secondary | ICD-10-CM | POA: Diagnosis not present

## 2019-10-25 DIAGNOSIS — N186 End stage renal disease: Secondary | ICD-10-CM | POA: Diagnosis not present

## 2019-10-25 DIAGNOSIS — I953 Hypotension of hemodialysis: Secondary | ICD-10-CM | POA: Diagnosis not present

## 2019-10-25 DIAGNOSIS — Z992 Dependence on renal dialysis: Secondary | ICD-10-CM | POA: Diagnosis not present

## 2019-10-25 DIAGNOSIS — N2581 Secondary hyperparathyroidism of renal origin: Secondary | ICD-10-CM | POA: Diagnosis not present

## 2019-10-26 DIAGNOSIS — Z992 Dependence on renal dialysis: Secondary | ICD-10-CM | POA: Diagnosis not present

## 2019-10-26 DIAGNOSIS — I953 Hypotension of hemodialysis: Secondary | ICD-10-CM | POA: Diagnosis not present

## 2019-10-26 DIAGNOSIS — N186 End stage renal disease: Secondary | ICD-10-CM | POA: Diagnosis not present

## 2019-10-26 DIAGNOSIS — N2581 Secondary hyperparathyroidism of renal origin: Secondary | ICD-10-CM | POA: Diagnosis not present

## 2019-10-29 ENCOUNTER — Encounter: Payer: Self-pay | Admitting: Internal Medicine

## 2019-10-29 ENCOUNTER — Other Ambulatory Visit: Payer: Self-pay

## 2019-10-29 ENCOUNTER — Ambulatory Visit (INDEPENDENT_AMBULATORY_CARE_PROVIDER_SITE_OTHER): Payer: BC Managed Care – PPO | Admitting: Internal Medicine

## 2019-10-29 VITALS — BP 136/80 | HR 90 | Ht 63.0 in | Wt 232.0 lb

## 2019-10-29 DIAGNOSIS — E785 Hyperlipidemia, unspecified: Secondary | ICD-10-CM

## 2019-10-29 DIAGNOSIS — Z992 Dependence on renal dialysis: Secondary | ICD-10-CM

## 2019-10-29 DIAGNOSIS — N2581 Secondary hyperparathyroidism of renal origin: Secondary | ICD-10-CM | POA: Diagnosis not present

## 2019-10-29 DIAGNOSIS — E1169 Type 2 diabetes mellitus with other specified complication: Secondary | ICD-10-CM

## 2019-10-29 DIAGNOSIS — N186 End stage renal disease: Secondary | ICD-10-CM | POA: Diagnosis not present

## 2019-10-29 DIAGNOSIS — I953 Hypotension of hemodialysis: Secondary | ICD-10-CM | POA: Diagnosis not present

## 2019-10-29 DIAGNOSIS — E1122 Type 2 diabetes mellitus with diabetic chronic kidney disease: Secondary | ICD-10-CM | POA: Diagnosis not present

## 2019-10-29 DIAGNOSIS — Z794 Long term (current) use of insulin: Secondary | ICD-10-CM

## 2019-10-29 MED ORDER — OZEMPIC (1 MG/DOSE) 4 MG/3ML ~~LOC~~ SOPN: 1.0000 mg | PEN_INJECTOR | SUBCUTANEOUS | 3 refills | Status: DC

## 2019-10-29 NOTE — Patient Instructions (Signed)
Please restart: - Ozempic 1 mg weekly  Continue: - U500 insulin: - 180-195 units before breakfast - 140-150 units before dinner if dinner is early, and 110-120 units if dinner is after dialysis.  Please check sugars 3 times a day, before meals and occasionally at bedtime.  Please return in 1.5 months with your sugar log.

## 2019-10-29 NOTE — Progress Notes (Signed)
Patient ID: Cassandra Campos, female   DOB: November 02, 1970, 49 y.o.   MRN: [id number]   This visit occurred during the SARS-CoV-2 public health emergency.  Safety protocols were in place, including screening questions prior to the visit, additional usage of staff PPE, and extensive cleaning of exam room while observing appropriate contact time as indicated for disinfecting solutions.   HPI: Cassandra Campos is a 49 y.o.-year-old female, referred by her PCP, Dr. Baird Cancer, for management of DM2, dx in 1997, insulin-dependent right after dx, uncontrolled, with complications (ESRD-on HD since 12/2017, DR, PN).  Last visit 1.5 months ago.  Reviewed HbA1c levels: Lab Results  Component Value Date   HGBA1C 8.7 (A) 09/11/2019   HGBA1C 10.9 (H) 04/19/2019   HGBA1C 6.9 (H) 11/09/2018   HGBA1C 7.1 (H) 07/11/2018   HGBA1C 8.5 (H) 03/27/2018   HGBA1C 8.2 (H) 02/08/2013   Pt is on a regimen of: - Ozempic 1 mg weekly - forgot the majority of the doses! - U500 insulin (started 2020) - 180-195 units before breakfast - 140-150 units before dinner if dinner is early, and 110-120 units if dinner is after dialysis. She was on Levemir and Humalog.  Pt checks her sugars 2-3 times a day: - am: n/c >> 78 -226, 407 (no insulin at night) >> 59-455 - 2h after b'fast: n/c - before lunch: 19-147W (with certain foods or w/o insulin) >> 180-405  - 2h after lunch: n/c - before dinner: 96-300s > n/c - 2h after dinner: n/c - bedtime: n/c - nighttime: n/c Lowest sugar was 71 >>59 ; she has hypoglycemia awareness at 60s.  Highest sugar was 407 >> 455.  Glucometer: Contour next  Pt's meals are: - Breakfast: yoghurt and blueberries; egg McMuffin - Lunch: fish, tacos, subs, cake - Dinner: salad, steak, shrimp - occasionally after HD at 10 pm, but more recently she has this during dialysis, around 7 PM - Snacks: fruit, peanuts  -+ ESRD, last BUN/creatinine:  Lab Results  Component Value Date   BUN 35  (H) 07/17/2019   BUN 17 09/20/2018   CREATININE 5.58 (H) 07/17/2019   CREATININE 3.85 (H) 09/20/2018   HD: M, Tu, Th, F (home) - in the evening, for 3-4 hours: 6:30 PM to 10 PM She is on the kidney transplant list at [facility]. Requirements for kidney transplant: <200 lbs, HbA1c <8%.  -+ HL; last set of lipids: Lab Results  Component Value Date   CHOL 192 08/07/2019   HDL 49 08/07/2019   LDLCALC 96 08/07/2019   TRIG 280 (H) 08/07/2019   CHOLHDL 3.9 08/07/2019  On Crestor 20.  - last eye exam was in 03/2019: + DR. + h/o retinal detachment, + cataracts. She has IO injections.  - + numbness and tingling in her feet.  On Lyrica.  Pt has FH of DM in M, F.  She also has a history of NAFLD, OSA (on CPAP), gout.   ROS: Constitutional: + Weight gain and weight loss, no fatigue, no subjective hyperthermia, no subjective hypothermia Eyes: no blurry vision, no xerophthalmia ENT: no sore throat, no nodules palpated in neck, no dysphagia, no odynophagia, no hoarseness Cardiovascular: no CP/no SOB/no palpitations/+ leg swelling Respiratory: no cough/no SOB/no wheezing Gastrointestinal: no N/no V/+ D/no C/no acid reflux Musculoskeletal: no muscle aches/no joint aches Skin: no rashes, no hair loss Neurological: no tremors/+ numbness/+ tingling/no dizziness  I reviewed pt's medications, allergies, PMH, social hx, family hx, and changes were documented in the history of present illness. Otherwise,  unchanged from my initial visit note.  Past Medical History:  Diagnosis Date  . Anemia associated with chronic renal failure   . ASCUS of cervix with negative high risk HPV 06/2017  . Back pain   . Chest pain   . CHF (congestive heart failure) ([place])   . CKD (chronic kidney disease), stage IV ([place]) no dialysis yet   nephrologist-  dr Jamal Maes-- scheduled next visit 09-08-2017 (lov 07-05-2017)  . Constipation   . Diabetic retinopathy ([place])   . Dialysis patient ([place])   . Edema, lower extremity    . Elevated transaminase level   . Fatty liver    FOLLOWED BY DR Henrene Pastor  . GERD (gastroesophageal reflux disease)   . Hypertension   . Idiopathic gout of multiple sites    09-05-2017 per pt stable  . Insulin dependent type 2 diabetes mellitus ([place])    followed by dr Toy Care  . Joint pain   . Mixed hyperlipidemia   . OSA (obstructive sleep apnea)    per study 10-20-2015 mild osa w/ AHI 10.3/hr;  09-05-2017 per pt has not used cpap since 1 yr ago  . Palpitations   . Peripheral neuropathy    feet  . S/P arteriovenous (AV) fistula creation 07-04-2015  w/ revision 02-14-2017   left braniocephilic  . Shortness of breath   . Trigger thumb, right thumb   . Umbilical hernia   . Vitamin D deficiency    Past Surgical History:  Procedure Laterality Date  . ABDOMINAL HYSTERECTOMY  07/11/2000   dr gott   TAH/BSO for irregular bleeding and leiomyoma  . AV FISTULA PLACEMENT Left 07/04/2015   Procedure: ARTERIOVENOUS (AV) FISTULA CREATION-LEFT;  Surgeon: Mal Misty, MD;  Location: [facility];  Service: Vascular;  Laterality: Left;  . BREAST EXCISIONAL BIOPSY Right    benign  . CARPAL TUNNEL RELEASE Bilateral 1993 and 1994  . CESAREAN SECTION  1993:  09-25-1999   BILATERAL TUBAL LIGATION W/ LAST C/S  . DILATION AND CURETTAGE OF UTERUS    . EXPLORATORY LAPARTOMY / LYSIS ADHESIONS/  BILATERAL SALPINGOOPHORECTOMY  07-20-2011  dr s. Rhodia Albright  [facility]  . FISTULA SUPERFICIALIZATION Left 08/28/2015   Procedure: BRACHIOCEPHALIC FISTULA SUPERFICIALIZATION;  Surgeon: Mal Misty, MD;  Location: [place];  Service: Vascular;  Laterality: Left;  . PATCH ANGIOPLASTY Left 02/14/2017   Procedure: PATCH ANGIOPLASTY;  Surgeon: Elam Dutch, MD;  Location: [place];  Service: Vascular;  Laterality: Left;  . PELVIC LAPAROSCOPY  1994   exploration for ectopic preg.  Marland Kitchen RETINAL DETACHMENT SURGERY Left 2015  . REVISON OF ARTERIOVENOUS FISTULA Left 02/14/2017   Procedure: REVISON OF ARTERIOVENOUS FISTULA  LEFT ARM;   Surgeon: Elam Dutch, MD;  Location: [place];  Service: Vascular;  Laterality: Left;  . TRIGGER FINGER RELEASE Left 2015   thumb  . TRIGGER FINGER RELEASE Right 09/09/2017   Procedure: RELEASE TRIGGER FINGER/A-1 PULLEY RIGHT THUMB;  Surgeon: Dorna Leitz, MD;  Location: [place];  Service: Orthopedics;  Laterality: Right;   Social History   Socioeconomic History  . Marital status: Married    Spouse name: Not on file  . Number of children: 2  . Years of education: Not on file  . Highest education level: Not on file  Occupational History  . Occupation: Press photographer and insurance  Tobacco Use  . Smoking status: Never Smoker  . Smokeless tobacco: Never Used  Vaping Use  . Vaping Use: Never used  Substance and Sexual  Activity  . Alcohol use: Never    Alcohol/week: 0.0 standard drinks  . Drug use: No  . Sexual activity: Yes    Birth control/protection: Surgical    Comment: HYST-1st intercourse 49 yo-Fewer than 5 partners  Other Topics Concern  . Not on file  Social History Narrative  . Not on file   Social Determinants of Health   Financial Resource Strain:   . Difficulty of Paying Living Expenses:   Food Insecurity:   . Worried About Charity fundraiser in the Last Year:   . Arboriculturist in the Last Year:   Transportation Needs:   . Film/video editor (Medical):   Marland Kitchen Lack of Transportation (Non-Medical):   Physical Activity:   . Days of Exercise per Week:   . Minutes of Exercise per Session:   Stress:   . Feeling of Stress :   Social Connections:   . Frequency of Communication with Friends and Family:   . Frequency of Social Gatherings with Friends and Family:   . Attends Religious Services:   . Active Member of Clubs or Organizations:   . Attends Archivist Meetings:   Marland Kitchen Marital Status:   Intimate Partner Violence:   . Fear of Current or Ex-Partner:   . Emotionally Abused:   Marland Kitchen Physically Abused:   . Sexually Abused:     Current Outpatient Medications on File Prior to Visit  Medication Sig Dispense Refill  . allopurinol (ZYLOPRIM) 300 MG tablet Take 1 tablet (300 mg total) by mouth every morning. 90 tablet 1  . Armodafinil 150 MG tablet Take 1 tablet (150 mg total) by mouth daily. 30 tablet 5  . B Complex-C-Folic Acid (RENAL) 1 MG CAPS TAKE 1 CAPSULE BY MOUTH DAILY (ON DIALYSIS DAYS, TAKE AFTER DIALYSIS TREATMENT )    . BELSOMRA 15 MG TABS Take 1 tablet by mouth at bedtime as needed.     . benzonatate (TESSALON PERLES) 100 MG capsule Take 1 capsule (100 mg total) by mouth 3 (three) times daily as needed for cough. 30 capsule 1  . calcitRIOL (ROCALTROL) 0.5 MCG capsule Take 0.5 mcg by mouth daily.    . cinacalcet (SENSIPAR) 60 MG tablet Take 60 mg by mouth daily. Every other day    . cromolyn (OPTICROM) 4 % ophthalmic solution 1 drop 4 (four) times daily.    Marland Kitchen HUMULIN R 500 UNIT/ML injection Inject under skin up to 200 units in the morning and up to 150 units in the evening 20 mL 11  . Insulin Pen Needle 32G X 4 MM MISC Use 2x a day 200 each 3  . lanthanum (FOSRENOL) 1000 MG chewable tablet Chew 1,000 mg by mouth 3 (three) times daily with meals.    . lidocaine-prilocaine (EMLA) cream USE ON [place]AMS AS NEEDED FOR DIALYSIS  10  . Microlet Lancets MISC Use as directed to check blood sugars 4 times per day dx: e11.22 300 each 2  . MITIGARE 0.6 MG CAPS TAKE ONE CAPSULE BY MOUTH ONCE DAILY 90 capsule 0  . neomycin-polymyxin-hydrocortisone (CORTISPORIN) OTIC solution Apply 1-2 drops to the toe after soaking toe twice a day 10 mL 0  . pregabalin (LYRICA) 100 MG capsule TAKE 1 CAPSULE BY MOUTH IN THE MORNING FOR NEUROPATHY 90 capsule 1  . pregabalin (LYRICA) 75 MG capsule Take one capsule by mouth at bedtime 90 capsule 1  . RESTASIS 0.05 % ophthalmic emulsion INSTILL 1 DROP INTO BOTH EYES TWICE A DAY  3  . rosuvastatin (CRESTOR) 20 MG tablet TAKE 1 TABLET BY MOUTH EVERY DAY 90 tablet 1  . Semaglutide, 1 MG/DOSE,  (OZEMPIC, 1 MG/DOSE,) 4 MG/3ML SOPN Inject 1 mg into the skin once a week. 9 mL 3   No current facility-administered medications on file prior to visit.   No Known Allergies Family History  Problem Relation Age of Onset  . Diabetes Mother   . Hypertension Mother   . Thyroid disease Mother   . Diabetes Father   . Hypertension Father   . Cancer Father        STOMACH  . Stomach cancer Father   . Stroke Maternal Grandmother   . Stroke Maternal Grandfather   . Colon cancer Neg Hx   . Rectal cancer Neg Hx     PE: BP 136/80   Pulse 90   Ht 5\' 3"  (1.6 m)   Wt 232 lb (105.2 kg)   SpO2 95%   BMI 41.10 kg/m  Wt Readings from Last 3 Encounters:  10/29/19 232 lb (105.2 kg)  10/09/19 231 lb (104.8 kg)  09/13/19 228 lb (103.4 kg)   Constitutional: overweight, in NAD Eyes: PERRLA, EOMI, no exophthalmos ENT: moist mucous membranes, no thyromegaly, no cervical lymphadenopathy Cardiovascular: RRR, No MRG Respiratory: CTA B Gastrointestinal: abdomen soft, NT, ND, BS+ Musculoskeletal: no deformities, strength intact in all 4 Skin: moist, warm, no rashes Neurological: no tremor with outstretched hands, DTR normal in all 4  ASSESSMENT: 1. DM2, insulin-dependent, uncontrolled, with complications - ESRD, on HD, awaiting kidney transplant - DR - PN  2. HL  3.  Obesity class III  PLAN:  1. Patient with longstanding, uncontrolled, type 2 diabetes, on injectable antidiabetic regimen with weekly GLP-1 receptor agonist and concentrated insulin, with still high HbA1c at last visit, of 8.7% (although improved).  2 weeks before last visit she started to work with the [facility] weight management clinic as she needs to be under 200 pounds to qualify for a kidney transplant.  Also, from the diabetes point of view, HbA1c needs to be lower than 8%. -At last visit, sugars at home were fluctuating and we have to be careful with insulin doses due to her dialysis times.  She was taking her evening insulin 30  minutes before dinner, however, dinner was occasionally right before dialysis her right after dialysis.  When she took the insulin after dialysis, at 10 PM, she noticed that her sugars were dropping in the morning.  We discussed about reducing the dose of insulin if she had to have a late dinner but I also suspect that insulin may dialyze at least in part if she was pleased taking it before dinner.  Therefore, I advised her that if she had dinner before dialysis to take a slightly higher dose.  Last visit we also increased the U500 insulin before breakfast.  We continued Ozempic.  She tolerates this well. -At this visit, she tells me that she forgets the majority of Ozempic doses.  We discussed that it is very important to take it and I suggested to set alarms on her phone so that she does not forget it, as sugars are normal variable than before. -We also discussed about the possibility of insulin being dialyzed at a certain extent if she needs to inject it before dialysis.  I advised her to experiment with different dinner mealtimes for now.  I would suggest to keep the same insulin doses. -We have discussed about other options for treatment.  She is interested in maybe going back to Lantus but I advised her that this is not an option due to the high doses of insulin that she is using.  An insulin pump would be an option, but still with U500 insulin in the pump.  She is not interested in this. - I suggested to:  Patient Instructions  Please restart: - Ozempic 1 mg weekly  Continue: - U500 insulin: - 180-195 units before breakfast - 140-150 units before dinner if dinner is early, and 110-120 units if dinner is after dialysis.  Please check sugars 3 times a day, before meals and occasionally at bedtime.  Please return in 1.5 months with your sugar log.   - advised to check sugars at different times of the day - 3x a day, rotating check times - advised for yearly eye exams >> she is UTD - return to  clinic in 1.5 months  2. HL - Reviewed latest lipid panel from 07/2019: LDL above goal of less than 70, triglycerides also high: Lab Results  Component Value Date   CHOL 192 08/07/2019   HDL 49 08/07/2019   LDLCALC 96 08/07/2019   TRIG 280 (H) 08/07/2019   CHOLHDL 3.9 08/07/2019  - Continues the statin without side effects.  3.  Obesity class III -she now started going to the weight management clinic-she tells me that she cannot really follow the diet, because it did not seem to take to patient's analysis. -no weight loss since last visit  -We will restart Ozempic, which should also help with weight loss and decreasing appetite  Philemon Kingdom, MD PhD [facility]

## 2019-10-30 ENCOUNTER — Telehealth: Payer: Self-pay

## 2019-10-30 ENCOUNTER — Encounter: Payer: Self-pay | Admitting: Internal Medicine

## 2019-10-30 ENCOUNTER — Telehealth (INDEPENDENT_AMBULATORY_CARE_PROVIDER_SITE_OTHER): Payer: BC Managed Care – PPO | Admitting: Internal Medicine

## 2019-10-30 DIAGNOSIS — N186 End stage renal disease: Secondary | ICD-10-CM | POA: Diagnosis not present

## 2019-10-30 DIAGNOSIS — M1A3790 Chronic gout due to renal impairment, unspecified ankle and foot, without tophus (tophi): Secondary | ICD-10-CM | POA: Diagnosis not present

## 2019-10-30 DIAGNOSIS — I953 Hypotension of hemodialysis: Secondary | ICD-10-CM | POA: Diagnosis not present

## 2019-10-30 DIAGNOSIS — Z794 Long term (current) use of insulin: Secondary | ICD-10-CM

## 2019-10-30 DIAGNOSIS — E1122 Type 2 diabetes mellitus with diabetic chronic kidney disease: Secondary | ICD-10-CM | POA: Diagnosis not present

## 2019-10-30 DIAGNOSIS — J0110 Acute frontal sinusitis, unspecified: Secondary | ICD-10-CM | POA: Diagnosis not present

## 2019-10-30 DIAGNOSIS — Z992 Dependence on renal dialysis: Secondary | ICD-10-CM | POA: Diagnosis not present

## 2019-10-30 DIAGNOSIS — N2581 Secondary hyperparathyroidism of renal origin: Secondary | ICD-10-CM | POA: Diagnosis not present

## 2019-10-30 MED ORDER — AZITHROMYCIN 250 MG PO TABS: ORAL_TABLET | ORAL | 0 refills | Status: AC

## 2019-10-30 NOTE — Telephone Encounter (Signed)
Patient consent to [facility] visit

## 2019-10-30 NOTE — Patient Instructions (Signed)

## 2019-10-30 NOTE — Progress Notes (Signed)
Virtual Visit via Video   This visit type was conducted due to national recommendations for restrictions regarding the COVID-19 Pandemic (e.g. social distancing) in an effort to limit this patient's exposure and mitigate transmission in our community.  Due to her co-morbid illnesses, this patient is at least at moderate risk for complications without adequate follow up.  This format is felt to be most appropriate for this patient at this time.  All issues noted in this document were discussed and addressed.  A limited physical exam was performed with this format.    This visit type was conducted due to national recommendations for restrictions regarding the COVID-19 Pandemic (e.g. social distancing) in an effort to limit this patient's exposure and mitigate transmission in our community.  Patients identity confirmed using two different identifiers.  This format is felt to be most appropriate for this patient at this time.  All issues noted in this document were discussed and addressed.  No physical exam was performed (except for noted visual exam findings with Video Visits).    Date:  10/30/2019   ID:  Cassandra Campos, DOB [birth year], MRN [id number]  Patient Location:  Work  Provider location:   [employer] Complaint:  "I think I have sinus infection"  History of Present Illness:    Cassandra Campos is a 49 y.o. female who presents via video conferencing for a telehealth visit today.    The patient does not have symptoms concerning for COVID-19 infection (fever, chills, cough, or new shortness of breath).   Sinusitis This is a new problem. The current episode started in the past 7 days. The problem has been gradually worsening since onset. There has been no fever. Her pain is at a severity of 7/10. The pain is moderate. Associated symptoms include congestion, headaches and swollen glands. Pertinent negatives include no neck pain or shortness of breath. Past  treatments include nothing. The treatment provided no relief.     Past Medical History:  Diagnosis Date  . Anemia associated with chronic renal failure   . ASCUS of cervix with negative high risk HPV 06/2017  . Back pain   . Chest pain   . CHF (congestive heart failure) ([place])   . CKD (chronic kidney disease), stage IV ([place]) no dialysis yet   nephrologist-  dr Jamal Maes-- scheduled next visit 09-08-2017 (lov 07-05-2017)  . Constipation   . Diabetic retinopathy ([place])   . Dialysis patient ([place])   . Edema, lower extremity   . Elevated transaminase level   . Fatty liver    FOLLOWED BY DR Henrene Pastor  . GERD (gastroesophageal reflux disease)   . Hypertension   . Idiopathic gout of multiple sites    09-05-2017 per pt stable  . Insulin dependent type 2 diabetes mellitus ([place])    followed by dr Toy Care  . Joint pain   . Mixed hyperlipidemia   . OSA (obstructive sleep apnea)    per study 10-20-2015 mild osa w/ AHI 10.3/hr;  09-05-2017 per pt has not used cpap since 1 yr ago  . Palpitations   . Peripheral neuropathy    feet  . S/P arteriovenous (AV) fistula creation 07-04-2015  w/ revision 02-14-2017   left braniocephilic  . Shortness of breath   . Trigger thumb, right thumb   . Umbilical hernia   . Vitamin D deficiency    Past Surgical History:  Procedure Laterality Date  . ABDOMINAL HYSTERECTOMY  07/11/2000   dr Jomarie Longs  TAH/BSO for irregular bleeding and leiomyoma  . AV FISTULA PLACEMENT Left 07/04/2015   Procedure: ARTERIOVENOUS (AV) FISTULA CREATION-LEFT;  Surgeon: Mal Misty, MD;  Location: [facility];  Service: Vascular;  Laterality: Left;  . BREAST EXCISIONAL BIOPSY Right    benign  . CARPAL TUNNEL RELEASE Bilateral 1993 and 1994  . CESAREAN SECTION  1993:  09-25-1999   BILATERAL TUBAL LIGATION W/ LAST C/S  . DILATION AND CURETTAGE OF UTERUS    . EXPLORATORY LAPARTOMY / LYSIS ADHESIONS/  BILATERAL SALPINGOOPHORECTOMY  07-20-2011  dr s. Rhodia Albright  [facility]  . FISTULA  SUPERFICIALIZATION Left 08/28/2015   Procedure: BRACHIOCEPHALIC FISTULA SUPERFICIALIZATION;  Surgeon: Mal Misty, MD;  Location: [facility];  Service: Vascular;  Laterality: Left;  . PATCH ANGIOPLASTY Left 02/14/2017   Procedure: PATCH ANGIOPLASTY;  Surgeon: Elam Dutch, MD;  Location: [facility];  Service: Vascular;  Laterality: Left;  . PELVIC LAPAROSCOPY  1994   exploration for ectopic preg.  Marland Kitchen RETINAL DETACHMENT SURGERY Left 2015  . REVISON OF ARTERIOVENOUS FISTULA Left 02/14/2017   Procedure: REVISON OF ARTERIOVENOUS FISTULA  LEFT ARM;  Surgeon: Elam Dutch, MD;  Location: [facility];  Service: Vascular;  Laterality: Left;  . TRIGGER FINGER RELEASE Left 2015   thumb  . TRIGGER FINGER RELEASE Right 09/09/2017   Procedure: RELEASE TRIGGER FINGER/A-1 PULLEY RIGHT THUMB;  Surgeon: Dorna Leitz, MD;  Location: [place];  Service: Orthopedics;  Laterality: Right;     Current Meds  Medication Sig  . allopurinol (ZYLOPRIM) 300 MG tablet Take 1 tablet (300 mg total) by mouth every morning.  . Armodafinil 150 MG tablet Take 1 tablet (150 mg total) by mouth daily.  . B Complex-C-Folic Acid (RENAL) 1 MG CAPS TAKE 1 CAPSULE BY MOUTH DAILY (ON DIALYSIS DAYS, TAKE AFTER DIALYSIS TREATMENT )  . BELSOMRA 15 MG TABS Take 1 tablet by mouth at bedtime as needed.   . benzonatate (TESSALON PERLES) 100 MG capsule Take 1 capsule (100 mg total) by mouth 3 (three) times daily as needed for cough.  . calcitRIOL (ROCALTROL) 0.5 MCG capsule Take 0.5 mcg by mouth daily.  . cinacalcet (SENSIPAR) 60 MG tablet Take 60 mg by mouth daily. Every other day  . cromolyn (OPTICROM) 4 % ophthalmic solution 1 drop 4 (four) times daily.  Marland Kitchen HUMULIN R 500 UNIT/ML injection Inject under skin up to 200 units in the morning and up to 150 units in the evening  . Insulin Pen Needle 32G X 4 MM MISC Use 2x a day  . lanthanum (FOSRENOL) 1000 MG chewable tablet Chew 1,000 mg by mouth 3 (three) times daily with meals.  .  lidocaine-prilocaine (EMLA) cream USE ON ARAMS AS NEEDED FOR DIALYSIS  . Microlet Lancets MISC Use as directed to check blood sugars 4 times per day dx: e11.22  . MITIGARE 0.6 MG CAPS TAKE ONE CAPSULE BY MOUTH ONCE DAILY  . pregabalin (LYRICA) 100 MG capsule TAKE 1 CAPSULE BY MOUTH IN THE MORNING FOR NEUROPATHY  . pregabalin (LYRICA) 75 MG capsule Take one capsule by mouth at bedtime  . RESTASIS 0.05 % ophthalmic emulsion INSTILL 1 DROP INTO BOTH EYES TWICE A DAY  . rosuvastatin (CRESTOR) 20 MG tablet TAKE 1 TABLET BY MOUTH EVERY DAY  . Semaglutide, 1 MG/DOSE, (OZEMPIC, 1 MG/DOSE,) 4 MG/3ML SOPN Inject 0.75 mLs (1 mg total) into the skin once a week.     Allergies:   Patient has no known allergies.   Social History  Tobacco Use  . Smoking status: Never Smoker  . Smokeless tobacco: Never Used  Vaping Use  . Vaping Use: Never used  Substance Use Topics  . Alcohol use: Never    Alcohol/week: 0.0 standard drinks  . Drug use: No     Family Hx: The patient's family history includes Cancer in her father; Diabetes in her father and mother; Hypertension in her father and mother; Stomach cancer in her father; Stroke in her maternal grandfather and maternal grandmother; Thyroid disease in her mother. There is no history of Colon cancer or Rectal cancer.  ROS:   Please see the history of present illness.    Review of Systems  HENT: Positive for congestion.   Respiratory: Negative for shortness of breath.   Musculoskeletal: Negative for neck pain.  Neurological: Positive for headaches.    All other systems reviewed and are negative.   Labs/Other Tests and Data Reviewed:    Recent Labs: 11/09/2018: TSH 2.360 07/17/2019: ALT 32; BUN 35; Creatinine, Ser 5.58; Hemoglobin 10.7; Platelets 153; Potassium 3.3; Sodium 136   Recent Lipid Panel Lab Results  Component Value Date/Time   CHOL 192 08/07/2019 04:12 PM   TRIG 280 (H) 08/07/2019 04:12 PM   HDL 49 08/07/2019 04:12 PM   CHOLHDL 3.9  08/07/2019 04:12 PM   LDLCALC 96 08/07/2019 04:12 PM    Wt Readings from Last 3 Encounters:  10/29/19 232 lb (105.2 kg)  10/09/19 231 lb (104.8 kg)  09/13/19 228 lb (103.4 kg)     Exam:    Vital Signs:  There were no vitals taken for this visit.    Physical Exam Vitals and nursing note reviewed.  Constitutional:      Appearance: She is obese.  HENT:     Head: Normocephalic and atraumatic.  Pulmonary:     Effort: Pulmonary effort is normal.  Neurological:     Mental Status: She is alert and oriented to person, place, and time.  Psychiatric:        Mood and Affect: Affect normal.     ASSESSMENT & PLAN:    1. Acute non-recurrent frontal sinusitis  She was given rx azithromycin, take as Zpack. Encouraged to take full abx course.   2. 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 Encouraged to stay well hydrated. Advised to continue with allopurinol 300mg  daily.   3. Type 2 diabetes mellitus with chronic kidney disease on chronic dialysis, with long-term current use of insulin (HCC)  Chronic. States her sugars are slowly improving, now under the care of Endocrine. Encouraged her to follow dietary recommendations.   4. Dependence on renal dialysis ([place])    COVID-19 Education: The signs and symptoms of COVID-19 were discussed with the patient and how to seek care for testing (follow up with PCP or arrange E-visit).  The importance of social distancing was discussed today.  Patient Risk:   After full review of this patients clinical status, I feel that they are at least moderate risk at this time.  Time:   Today, I have spent 20 minutes/ seconds with the patient with telehealth technology discussing above diagnoses.     Medication Adjustments/Labs and Tests Ordered: Current medicines are reviewed at length with the patient today.  Concerns regarding medicines are outlined above.   Tests Ordered: No orders of  the defined types were placed in this encounter.   Medication Changes: No orders of the defined types were placed in this encounter.  Disposition:  Follow up prn  Signed, Maximino [place], MD

## 2019-11-01 DIAGNOSIS — Z992 Dependence on renal dialysis: Secondary | ICD-10-CM | POA: Diagnosis not present

## 2019-11-01 DIAGNOSIS — I953 Hypotension of hemodialysis: Secondary | ICD-10-CM | POA: Diagnosis not present

## 2019-11-01 DIAGNOSIS — N2581 Secondary hyperparathyroidism of renal origin: Secondary | ICD-10-CM | POA: Diagnosis not present

## 2019-11-01 DIAGNOSIS — N186 End stage renal disease: Secondary | ICD-10-CM | POA: Diagnosis not present

## 2019-11-02 DIAGNOSIS — N186 End stage renal disease: Secondary | ICD-10-CM | POA: Diagnosis not present

## 2019-11-02 DIAGNOSIS — I953 Hypotension of hemodialysis: Secondary | ICD-10-CM | POA: Diagnosis not present

## 2019-11-02 DIAGNOSIS — N2581 Secondary hyperparathyroidism of renal origin: Secondary | ICD-10-CM | POA: Diagnosis not present

## 2019-11-02 DIAGNOSIS — Z992 Dependence on renal dialysis: Secondary | ICD-10-CM | POA: Diagnosis not present

## 2019-11-05 ENCOUNTER — Other Ambulatory Visit: Payer: Self-pay

## 2019-11-05 ENCOUNTER — Encounter (INDEPENDENT_AMBULATORY_CARE_PROVIDER_SITE_OTHER): Payer: Self-pay | Admitting: Family Medicine

## 2019-11-05 ENCOUNTER — Encounter: Payer: Self-pay | Admitting: Internal Medicine

## 2019-11-05 ENCOUNTER — Ambulatory Visit (INDEPENDENT_AMBULATORY_CARE_PROVIDER_SITE_OTHER): Payer: BC Managed Care – PPO | Admitting: Family Medicine

## 2019-11-05 VITALS — BP 130/78 | HR 87 | Temp 98.3°F | Ht 63.0 in | Wt 229.0 lb

## 2019-11-05 DIAGNOSIS — E1159 Type 2 diabetes mellitus with other circulatory complications: Secondary | ICD-10-CM

## 2019-11-05 DIAGNOSIS — I10 Essential (primary) hypertension: Secondary | ICD-10-CM

## 2019-11-05 DIAGNOSIS — E119 Type 2 diabetes mellitus without complications: Secondary | ICD-10-CM

## 2019-11-05 DIAGNOSIS — N186 End stage renal disease: Secondary | ICD-10-CM

## 2019-11-05 DIAGNOSIS — Z9189 Other specified personal risk factors, not elsewhere classified: Secondary | ICD-10-CM

## 2019-11-05 DIAGNOSIS — Z6841 Body Mass Index (BMI) 40.0 and over, adult: Secondary | ICD-10-CM

## 2019-11-05 DIAGNOSIS — Z794 Long term (current) use of insulin: Secondary | ICD-10-CM

## 2019-11-05 DIAGNOSIS — I152 Hypertension secondary to endocrine disorders: Secondary | ICD-10-CM

## 2019-11-05 DIAGNOSIS — Z992 Dependence on renal dialysis: Secondary | ICD-10-CM | POA: Diagnosis not present

## 2019-11-05 DIAGNOSIS — I953 Hypotension of hemodialysis: Secondary | ICD-10-CM | POA: Diagnosis not present

## 2019-11-05 DIAGNOSIS — N2581 Secondary hyperparathyroidism of renal origin: Secondary | ICD-10-CM | POA: Diagnosis not present

## 2019-11-05 NOTE — Progress Notes (Signed)
Chief Complaint:   OBESITY Cassandra Campos is here to discuss her progress with her obesity treatment plan along with follow-up of her obesity related diagnoses. Cassandra Campos is on practicing portion control and making smarter food choices, such as increasing vegetables and decreasing simple carbohydrates and states she is following her eating plan approximately 60-70% of the time. Cassandra Campos states she is exercising for 0 minutes 0 times per week.  Today's visit was #: 3 Starting weight: 228 lbs Starting date: 08/30/2019 Today's weight: 229 lbs Today's date: 11/05/2019 Total lbs lost to date: 0 Total lbs lost since last in-office visit: 0  Interim History: Cassandra Campos reports increased cravings in the afternoon (before lunch and dinner).  She says she eats crackers.  She misses rice and pasta.  We discussed Life Meal Prep, [employer]an [employer]atz, https://www.patel.info/.  Cassandra Campos provided the following food recall today:  Breakfast:  Egg. Lunch:  Sandwich, [place]. Dinner:  Meat (4 ounces), vegetables. Snack:  Watermelon or other fruit. Water:  8-12 ounces (limit for dialysis) 4 days per week.  Subjective:   1. Type 2 diabetes mellitus without complication, with long-term current use of insulin (HCC) Medications reviewed. Diabetic ROS: no polyuria or polydipsia, no chest pain, dyspnea or TIA's, no numbness, tingling or pain in extremities.  She is followed by Dr. Cruzita Lederer.  A1c was 8.7 on 09/11/2019, which is improved.  Lab Results  Component Value Date   HGBA1C 8.7 (A) 09/11/2019   HGBA1C 10.9 (H) 04/19/2019   HGBA1C 6.9 (H) 11/09/2018   Lab Results  Component Value Date   MICROALBUR 150 08/07/2019   LDLCALC 96 08/07/2019   CREATININE 5.58 (H) 07/17/2019   2. Hypertension associated with diabetes ([place]) Review: taking medications as instructed, no medication side effects noted, no chest pain on exertion, no dyspnea on exertion, no swelling of ankles.  Stable.    BP Readings from Last 3 Encounters:  11/05/19  130/78  10/29/19 136/80  10/09/19 134/76   3. ESRD (end stage renal disease) ([place]) Cassandra Campos is on dialysis and the transplant list.  She needs her BMI below 40.  4. At risk for heart disease Cassandra Campos is at a higher than average risk for cardiovascular disease due to obesity.   Assessment/Plan:   1. Type 2 diabetes mellitus without complication, with long-term current use of insulin (HCC) Good blood sugar control is important to decrease the likelihood of diabetic complications such as nephropathy, neuropathy, limb loss, blindness, coronary artery disease, and death. Intensive lifestyle modification including diet, exercise and weight loss are the first line of treatment for diabetes.   2. Hypertension associated with diabetes ([place]) Cassandra Campos is working on healthy weight loss and exercise to improve blood pressure control. We will watch for signs of hypotension as she continues her lifestyle modifications.  3. ESRD (end stage renal disease) ([place]) Will continue to monitor.  4. At risk for heart disease Cassandra Campos was given approximately 15 minutes of coronary artery disease prevention counseling today. She is 49 y.o. female and has risk factors for heart disease including obesity. We discussed intensive lifestyle modifications today with an emphasis on specific weight loss instructions and strategies.   Repetitive spaced learning was employed today to elicit superior memory formation and behavioral change.  5. Class 3 severe obesity with serious comorbidity and body mass index (BMI) of 40.0 to 44.9 in adult, unspecified obesity type (HCC) Cassandra Campos is currently in the action stage of change. As such, her goal is to continue with weight loss efforts. She  has agreed to keeping a food journal and adhering to recommended goals of 1200 calories and 75+ grams of protein.   Exercise goals: Work in 5-10 minutes of movement daily.  Behavioral modification strategies: decreasing simple carbohydrates and  meal planning and cooking strategies.  Cassandra Campos has agreed to follow-up with our clinic in 3 weeks. She was informed of the importance of frequent follow-up visits to maximize her success with intensive lifestyle modifications for her multiple health conditions.   Objective:   Blood pressure 130/78, pulse 87, temperature 98.3 F (36.8 C), temperature source Oral, height 5\' 3"  (1.6 m), weight 229 lb (103.9 kg), SpO2 98 %. Body mass index is 40.57 kg/m.  General: Cooperative, alert, well developed, in no acute distress. HEENT: Conjunctivae and lids unremarkable. Cardiovascular: Regular rhythm.  Lungs: Normal work of breathing. Neurologic: No focal deficits.   Lab Results  Component Value Date   CREATININE 5.58 (H) 07/17/2019   BUN 35 (H) 07/17/2019   NA 136 07/17/2019   K 3.3 (L) 07/17/2019   CL 95 (L) 07/17/2019   CO2 26 07/17/2019   Lab Results  Component Value Date   ALT 32 07/17/2019   AST 27 07/17/2019   ALKPHOS 52 07/17/2019   BILITOT 0.8 07/17/2019   Lab Results  Component Value Date   HGBA1C 8.7 (A) 09/11/2019   HGBA1C 10.9 (H) 04/19/2019   HGBA1C 6.9 (H) 11/09/2018   HGBA1C 7.1 (H) 07/11/2018   HGBA1C 8.5 (H) 03/27/2018   Lab Results  Component Value Date   TSH 2.360 11/09/2018   Lab Results  Component Value Date   CHOL 192 08/07/2019   HDL 49 08/07/2019   LDLCALC 96 08/07/2019   TRIG 280 (H) 08/07/2019   CHOLHDL 3.9 08/07/2019   Lab Results  Component Value Date   WBC 7.4 07/17/2019   HGB 10.7 (L) 07/17/2019   HCT 31.3 (L) 07/17/2019   MCV 89.9 07/17/2019   PLT 153 07/17/2019   Lab Results  Component Value Date   IRON 56 03/15/2017   IRON 56 03/15/2017   FERRITIN 404.4 (H) 03/15/2017   Attestation Statements:   Reviewed by clinician on day of visit: allergies, medications, problem list, medical history, surgical history, family history, social history, and previous encounter notes.  I, Water quality scientist, CMA, am acting as transcriptionist for  Briscoe Deutscher, DO  I have reviewed the above documentation for accuracy and completeness, and I agree with the above. Briscoe Deutscher, DO

## 2019-11-06 DIAGNOSIS — N2581 Secondary hyperparathyroidism of renal origin: Secondary | ICD-10-CM | POA: Diagnosis not present

## 2019-11-06 DIAGNOSIS — I953 Hypotension of hemodialysis: Secondary | ICD-10-CM | POA: Diagnosis not present

## 2019-11-06 DIAGNOSIS — N186 End stage renal disease: Secondary | ICD-10-CM | POA: Diagnosis not present

## 2019-11-06 DIAGNOSIS — Z992 Dependence on renal dialysis: Secondary | ICD-10-CM | POA: Diagnosis not present

## 2019-11-07 ENCOUNTER — Ambulatory Visit: Payer: BC Managed Care – PPO | Admitting: Internal Medicine

## 2019-11-08 DIAGNOSIS — N2581 Secondary hyperparathyroidism of renal origin: Secondary | ICD-10-CM | POA: Diagnosis not present

## 2019-11-08 DIAGNOSIS — I953 Hypotension of hemodialysis: Secondary | ICD-10-CM | POA: Diagnosis not present

## 2019-11-08 DIAGNOSIS — Z992 Dependence on renal dialysis: Secondary | ICD-10-CM | POA: Diagnosis not present

## 2019-11-08 DIAGNOSIS — N186 End stage renal disease: Secondary | ICD-10-CM | POA: Diagnosis not present

## 2019-11-09 DIAGNOSIS — Z992 Dependence on renal dialysis: Secondary | ICD-10-CM | POA: Diagnosis not present

## 2019-11-09 DIAGNOSIS — I953 Hypotension of hemodialysis: Secondary | ICD-10-CM | POA: Diagnosis not present

## 2019-11-09 DIAGNOSIS — N186 End stage renal disease: Secondary | ICD-10-CM | POA: Diagnosis not present

## 2019-11-09 DIAGNOSIS — N2581 Secondary hyperparathyroidism of renal origin: Secondary | ICD-10-CM | POA: Diagnosis not present

## 2019-11-12 DIAGNOSIS — Z992 Dependence on renal dialysis: Secondary | ICD-10-CM | POA: Diagnosis not present

## 2019-11-12 DIAGNOSIS — I953 Hypotension of hemodialysis: Secondary | ICD-10-CM | POA: Diagnosis not present

## 2019-11-12 DIAGNOSIS — N2581 Secondary hyperparathyroidism of renal origin: Secondary | ICD-10-CM | POA: Diagnosis not present

## 2019-11-12 DIAGNOSIS — N186 End stage renal disease: Secondary | ICD-10-CM | POA: Diagnosis not present

## 2019-11-13 DIAGNOSIS — Z992 Dependence on renal dialysis: Secondary | ICD-10-CM | POA: Diagnosis not present

## 2019-11-13 DIAGNOSIS — I953 Hypotension of hemodialysis: Secondary | ICD-10-CM | POA: Diagnosis not present

## 2019-11-13 DIAGNOSIS — N186 End stage renal disease: Secondary | ICD-10-CM | POA: Diagnosis not present

## 2019-11-13 DIAGNOSIS — N2581 Secondary hyperparathyroidism of renal origin: Secondary | ICD-10-CM | POA: Diagnosis not present

## 2019-11-14 DIAGNOSIS — Z992 Dependence on renal dialysis: Secondary | ICD-10-CM | POA: Diagnosis not present

## 2019-11-14 DIAGNOSIS — E1122 Type 2 diabetes mellitus with diabetic chronic kidney disease: Secondary | ICD-10-CM | POA: Diagnosis not present

## 2019-11-14 DIAGNOSIS — N186 End stage renal disease: Secondary | ICD-10-CM | POA: Diagnosis not present

## 2019-11-15 DIAGNOSIS — I953 Hypotension of hemodialysis: Secondary | ICD-10-CM | POA: Diagnosis not present

## 2019-11-15 DIAGNOSIS — N2581 Secondary hyperparathyroidism of renal origin: Secondary | ICD-10-CM | POA: Diagnosis not present

## 2019-11-15 DIAGNOSIS — N186 End stage renal disease: Secondary | ICD-10-CM | POA: Diagnosis not present

## 2019-11-15 DIAGNOSIS — Z992 Dependence on renal dialysis: Secondary | ICD-10-CM | POA: Diagnosis not present

## 2019-11-16 DIAGNOSIS — Z992 Dependence on renal dialysis: Secondary | ICD-10-CM | POA: Diagnosis not present

## 2019-11-16 DIAGNOSIS — N2581 Secondary hyperparathyroidism of renal origin: Secondary | ICD-10-CM | POA: Diagnosis not present

## 2019-11-16 DIAGNOSIS — N186 End stage renal disease: Secondary | ICD-10-CM | POA: Diagnosis not present

## 2019-11-16 DIAGNOSIS — I953 Hypotension of hemodialysis: Secondary | ICD-10-CM | POA: Diagnosis not present

## 2019-11-19 DIAGNOSIS — I953 Hypotension of hemodialysis: Secondary | ICD-10-CM | POA: Diagnosis not present

## 2019-11-19 DIAGNOSIS — Z992 Dependence on renal dialysis: Secondary | ICD-10-CM | POA: Diagnosis not present

## 2019-11-19 DIAGNOSIS — N186 End stage renal disease: Secondary | ICD-10-CM | POA: Diagnosis not present

## 2019-11-19 DIAGNOSIS — N2581 Secondary hyperparathyroidism of renal origin: Secondary | ICD-10-CM | POA: Diagnosis not present

## 2019-11-20 DIAGNOSIS — N186 End stage renal disease: Secondary | ICD-10-CM | POA: Diagnosis not present

## 2019-11-20 DIAGNOSIS — N2581 Secondary hyperparathyroidism of renal origin: Secondary | ICD-10-CM | POA: Diagnosis not present

## 2019-11-20 DIAGNOSIS — Z992 Dependence on renal dialysis: Secondary | ICD-10-CM | POA: Diagnosis not present

## 2019-11-20 DIAGNOSIS — I953 Hypotension of hemodialysis: Secondary | ICD-10-CM | POA: Diagnosis not present

## 2019-11-20 DIAGNOSIS — G4733 Obstructive sleep apnea (adult) (pediatric): Secondary | ICD-10-CM | POA: Diagnosis not present

## 2019-11-20 DIAGNOSIS — Z7682 Awaiting organ transplant status: Secondary | ICD-10-CM | POA: Diagnosis not present

## 2019-11-22 DIAGNOSIS — Z992 Dependence on renal dialysis: Secondary | ICD-10-CM | POA: Diagnosis not present

## 2019-11-22 DIAGNOSIS — N2581 Secondary hyperparathyroidism of renal origin: Secondary | ICD-10-CM | POA: Diagnosis not present

## 2019-11-22 DIAGNOSIS — I953 Hypotension of hemodialysis: Secondary | ICD-10-CM | POA: Diagnosis not present

## 2019-11-22 DIAGNOSIS — N186 End stage renal disease: Secondary | ICD-10-CM | POA: Diagnosis not present

## 2019-11-23 DIAGNOSIS — N186 End stage renal disease: Secondary | ICD-10-CM | POA: Diagnosis not present

## 2019-11-23 DIAGNOSIS — Z992 Dependence on renal dialysis: Secondary | ICD-10-CM | POA: Diagnosis not present

## 2019-11-23 DIAGNOSIS — N2581 Secondary hyperparathyroidism of renal origin: Secondary | ICD-10-CM | POA: Diagnosis not present

## 2019-11-23 DIAGNOSIS — I953 Hypotension of hemodialysis: Secondary | ICD-10-CM | POA: Diagnosis not present

## 2019-11-26 DIAGNOSIS — N186 End stage renal disease: Secondary | ICD-10-CM | POA: Diagnosis not present

## 2019-11-26 DIAGNOSIS — N2581 Secondary hyperparathyroidism of renal origin: Secondary | ICD-10-CM | POA: Diagnosis not present

## 2019-11-26 DIAGNOSIS — Z992 Dependence on renal dialysis: Secondary | ICD-10-CM | POA: Diagnosis not present

## 2019-11-26 DIAGNOSIS — I953 Hypotension of hemodialysis: Secondary | ICD-10-CM | POA: Diagnosis not present

## 2019-11-27 DIAGNOSIS — Z992 Dependence on renal dialysis: Secondary | ICD-10-CM | POA: Diagnosis not present

## 2019-11-27 DIAGNOSIS — I953 Hypotension of hemodialysis: Secondary | ICD-10-CM | POA: Diagnosis not present

## 2019-11-27 DIAGNOSIS — N2581 Secondary hyperparathyroidism of renal origin: Secondary | ICD-10-CM | POA: Diagnosis not present

## 2019-11-27 DIAGNOSIS — N186 End stage renal disease: Secondary | ICD-10-CM | POA: Diagnosis not present

## 2019-11-29 ENCOUNTER — Ambulatory Visit (INDEPENDENT_AMBULATORY_CARE_PROVIDER_SITE_OTHER): Payer: BC Managed Care – PPO | Admitting: Family Medicine

## 2019-11-29 DIAGNOSIS — N2581 Secondary hyperparathyroidism of renal origin: Secondary | ICD-10-CM | POA: Diagnosis not present

## 2019-11-29 DIAGNOSIS — I953 Hypotension of hemodialysis: Secondary | ICD-10-CM | POA: Diagnosis not present

## 2019-11-29 DIAGNOSIS — Z992 Dependence on renal dialysis: Secondary | ICD-10-CM | POA: Diagnosis not present

## 2019-11-29 DIAGNOSIS — N186 End stage renal disease: Secondary | ICD-10-CM | POA: Diagnosis not present

## 2019-11-30 DIAGNOSIS — N2581 Secondary hyperparathyroidism of renal origin: Secondary | ICD-10-CM | POA: Diagnosis not present

## 2019-11-30 DIAGNOSIS — I953 Hypotension of hemodialysis: Secondary | ICD-10-CM | POA: Diagnosis not present

## 2019-11-30 DIAGNOSIS — N186 End stage renal disease: Secondary | ICD-10-CM | POA: Diagnosis not present

## 2019-11-30 DIAGNOSIS — Z992 Dependence on renal dialysis: Secondary | ICD-10-CM | POA: Diagnosis not present

## 2019-12-03 DIAGNOSIS — Z992 Dependence on renal dialysis: Secondary | ICD-10-CM | POA: Diagnosis not present

## 2019-12-03 DIAGNOSIS — I953 Hypotension of hemodialysis: Secondary | ICD-10-CM | POA: Diagnosis not present

## 2019-12-03 DIAGNOSIS — N186 End stage renal disease: Secondary | ICD-10-CM | POA: Diagnosis not present

## 2019-12-03 DIAGNOSIS — N2581 Secondary hyperparathyroidism of renal origin: Secondary | ICD-10-CM | POA: Diagnosis not present

## 2019-12-04 ENCOUNTER — Ambulatory Visit: Payer: BC Managed Care – PPO | Admitting: Internal Medicine

## 2019-12-04 DIAGNOSIS — N2581 Secondary hyperparathyroidism of renal origin: Secondary | ICD-10-CM | POA: Diagnosis not present

## 2019-12-04 DIAGNOSIS — Z992 Dependence on renal dialysis: Secondary | ICD-10-CM | POA: Diagnosis not present

## 2019-12-04 DIAGNOSIS — I953 Hypotension of hemodialysis: Secondary | ICD-10-CM | POA: Diagnosis not present

## 2019-12-04 DIAGNOSIS — N186 End stage renal disease: Secondary | ICD-10-CM | POA: Diagnosis not present

## 2019-12-06 DIAGNOSIS — I953 Hypotension of hemodialysis: Secondary | ICD-10-CM | POA: Diagnosis not present

## 2019-12-06 DIAGNOSIS — N186 End stage renal disease: Secondary | ICD-10-CM | POA: Diagnosis not present

## 2019-12-06 DIAGNOSIS — Z992 Dependence on renal dialysis: Secondary | ICD-10-CM | POA: Diagnosis not present

## 2019-12-06 DIAGNOSIS — N2581 Secondary hyperparathyroidism of renal origin: Secondary | ICD-10-CM | POA: Diagnosis not present

## 2019-12-07 DIAGNOSIS — I953 Hypotension of hemodialysis: Secondary | ICD-10-CM | POA: Diagnosis not present

## 2019-12-07 DIAGNOSIS — N2581 Secondary hyperparathyroidism of renal origin: Secondary | ICD-10-CM | POA: Diagnosis not present

## 2019-12-07 DIAGNOSIS — Z992 Dependence on renal dialysis: Secondary | ICD-10-CM | POA: Diagnosis not present

## 2019-12-07 DIAGNOSIS — N186 End stage renal disease: Secondary | ICD-10-CM | POA: Diagnosis not present

## 2019-12-10 ENCOUNTER — Ambulatory Visit (INDEPENDENT_AMBULATORY_CARE_PROVIDER_SITE_OTHER): Payer: Medicaid Other | Admitting: Physician Assistant

## 2019-12-10 ENCOUNTER — Encounter (INDEPENDENT_AMBULATORY_CARE_PROVIDER_SITE_OTHER): Payer: Self-pay | Admitting: Physician Assistant

## 2019-12-10 ENCOUNTER — Other Ambulatory Visit: Payer: Self-pay

## 2019-12-10 VITALS — BP 124/81 | HR 78 | Temp 98.2°F | Ht 63.0 in | Wt 225.0 lb

## 2019-12-10 DIAGNOSIS — Z992 Dependence on renal dialysis: Secondary | ICD-10-CM | POA: Diagnosis not present

## 2019-12-10 DIAGNOSIS — E119 Type 2 diabetes mellitus without complications: Secondary | ICD-10-CM

## 2019-12-10 DIAGNOSIS — Z794 Long term (current) use of insulin: Secondary | ICD-10-CM

## 2019-12-10 DIAGNOSIS — Z6841 Body Mass Index (BMI) 40.0 and over, adult: Secondary | ICD-10-CM | POA: Diagnosis not present

## 2019-12-10 DIAGNOSIS — I953 Hypotension of hemodialysis: Secondary | ICD-10-CM | POA: Diagnosis not present

## 2019-12-10 DIAGNOSIS — N186 End stage renal disease: Secondary | ICD-10-CM | POA: Diagnosis not present

## 2019-12-10 DIAGNOSIS — N2581 Secondary hyperparathyroidism of renal origin: Secondary | ICD-10-CM | POA: Diagnosis not present

## 2019-12-10 NOTE — Progress Notes (Signed)
Chief Complaint:   Cassandra Campos is here to discuss her progress with her obesity treatment plan along with follow-up of her obesity related diagnoses. Cassandra Campos is on the Category 3 Plan and states she is following her eating plan approximately 70% of the time. Cassandra Campos states she is stretching 15-30 minutes 4 times per week.  Today's visit was #: 4 Starting weight: 228 lbs Starting date: 08/30/2019 Today's weight: 225 lbs Today's date: 12/10/2019 Total lbs lost to date: 3 Total lbs lost since last in-office visit: 4  Interim History: Cassandra Campos states that she does not have an appetite many days due to her ESRD and dialysis. She cannot eat dairy products and would like to revamp her plan with other options. Dialysis food list was reviewed in detail today.  Subjective:   ESRD (end stage renal disease) ([place]). Cassandra Campos is on dialysis 4 days a week. She sees Dr. Jimmy Footman.  Type 2 diabetes mellitus without complication, with long-term current use of insulin ([place]). Cassandra Campos is on Ozempic and insulin. No hypoglycemia.  Lab Results  Component Value Date   HGBA1C 8.7 (A) 09/11/2019   HGBA1C 10.9 (H) 04/19/2019   HGBA1C 6.9 (H) 11/09/2018   Lab Results  Component Value Date   MICROALBUR 150 08/07/2019   [place] 96 08/07/2019   CREATININE 5.58 (H) 07/17/2019   No results found for: INSULIN  Assessment/Plan:   ESRD (end stage renal disease) ([place]). Cassandra Campos will follow-up with her nephrologist as scheduled and as directed.  Type 2 diabetes mellitus without complication, with long-term current use of insulin ([place]). Good blood sugar control is important to decrease the likelihood of diabetic complications such as nephropathy, neuropathy, limb loss, blindness, coronary artery disease, and death. Intensive lifestyle modification including diet, exercise and weight loss are the first line of treatment for diabetes. Cassandra Campos will continue her medications as directed.  Class 3  severe obesity with serious comorbidity and body mass index (BMI) of 40.0 to 44.9 in adult, unspecified obesity type ([place]).  Cassandra Campos is currently in the action stage of change. As such, her goal is to continue with weight loss efforts. She has agreed to the Category 3 Plan. She will break up food throughout the day.  Exercise goals: For substantial health benefits, adults should do at least 150 minutes (2 hours and 30 minutes) a week of moderate-intensity, or 75 minutes (1 hour and 15 minutes) a week of vigorous-intensity aerobic physical activity, or an equivalent combination of moderate- and vigorous-intensity aerobic activity. Aerobic activity should be performed in episodes of at least 10 minutes, and preferably, it should be spread throughout the week.  Behavioral modification strategies: no skipping meals and meal planning and cooking strategies.  Cassandra Campos has agreed to follow-up with our clinic in 4 weeks. She was informed of the importance of frequent follow-up visits to maximize her success with intensive lifestyle modifications for her multiple health conditions.   Objective:   Blood pressure 124/81, pulse 78, temperature 98.2 F (36.8 C), temperature source Oral, height 5\' 3"  (1.6 m), weight (!) 225 lb (102.1 kg), SpO2 97 %. Body mass index is 39.86 kg/m.  General: Cooperative, alert, well developed, in no acute distress. HEENT: Conjunctivae and lids unremarkable. Cardiovascular: Regular rhythm.  Lungs: Normal work of breathing. Neurologic: No focal deficits.   Lab Results  Component Value Date   CREATININE 5.58 (H) 07/17/2019   BUN 35 (H) 07/17/2019   NA 136 07/17/2019   K 3.3 (L) 07/17/2019   CL  95 (L) 07/17/2019   CO2 26 07/17/2019   Lab Results  Component Value Date   ALT 32 07/17/2019   AST 27 07/17/2019   ALKPHOS 52 07/17/2019   BILITOT 0.8 07/17/2019   Lab Results  Component Value Date   HGBA1C 8.7 (A) 09/11/2019   HGBA1C 10.9 (H) 04/19/2019   HGBA1C 6.9  (H) 11/09/2018   HGBA1C 7.1 (H) 07/11/2018   HGBA1C 8.5 (H) 03/27/2018   No results found for: INSULIN Lab Results  Component Value Date   TSH 2.360 11/09/2018   Lab Results  Component Value Date   CHOL 192 08/07/2019   HDL 49 08/07/2019   LDLCALC 96 08/07/2019   TRIG 280 (H) 08/07/2019   CHOLHDL 3.9 08/07/2019   Lab Results  Component Value Date   WBC 7.4 07/17/2019   HGB 10.7 (L) 07/17/2019   HCT 31.3 (L) 07/17/2019   MCV 89.9 07/17/2019   PLT 153 07/17/2019   Lab Results  Component Value Date   IRON 56 03/15/2017   IRON 56 03/15/2017   FERRITIN 404.4 (H) 03/15/2017   Attestation Statements:   Reviewed by clinician on day of visit: allergies, medications, problem list, medical history, surgical history, family history, social history, and previous encounter notes.  Time spent on visit including pre-visit chart review and post-visit charting and care was 32 minutes.   IMichaelene Song, am acting as transcriptionist for Abby Potash, PA-C   I have reviewed the above documentation for accuracy and completeness, and I agree with the above. Abby Potash, PA-C

## 2019-12-11 DIAGNOSIS — N2581 Secondary hyperparathyroidism of renal origin: Secondary | ICD-10-CM | POA: Diagnosis not present

## 2019-12-11 DIAGNOSIS — N186 End stage renal disease: Secondary | ICD-10-CM | POA: Diagnosis not present

## 2019-12-11 DIAGNOSIS — I953 Hypotension of hemodialysis: Secondary | ICD-10-CM | POA: Diagnosis not present

## 2019-12-11 DIAGNOSIS — Z992 Dependence on renal dialysis: Secondary | ICD-10-CM | POA: Diagnosis not present

## 2019-12-13 DIAGNOSIS — N2581 Secondary hyperparathyroidism of renal origin: Secondary | ICD-10-CM | POA: Diagnosis not present

## 2019-12-13 DIAGNOSIS — I953 Hypotension of hemodialysis: Secondary | ICD-10-CM | POA: Diagnosis not present

## 2019-12-13 DIAGNOSIS — Z992 Dependence on renal dialysis: Secondary | ICD-10-CM | POA: Diagnosis not present

## 2019-12-13 DIAGNOSIS — N186 End stage renal disease: Secondary | ICD-10-CM | POA: Diagnosis not present

## 2019-12-14 DIAGNOSIS — N186 End stage renal disease: Secondary | ICD-10-CM | POA: Diagnosis not present

## 2019-12-14 DIAGNOSIS — I953 Hypotension of hemodialysis: Secondary | ICD-10-CM | POA: Diagnosis not present

## 2019-12-14 DIAGNOSIS — Z992 Dependence on renal dialysis: Secondary | ICD-10-CM | POA: Diagnosis not present

## 2019-12-14 DIAGNOSIS — N2581 Secondary hyperparathyroidism of renal origin: Secondary | ICD-10-CM | POA: Diagnosis not present

## 2019-12-15 DIAGNOSIS — Z992 Dependence on renal dialysis: Secondary | ICD-10-CM | POA: Diagnosis not present

## 2019-12-15 DIAGNOSIS — N186 End stage renal disease: Secondary | ICD-10-CM | POA: Diagnosis not present

## 2019-12-15 DIAGNOSIS — E1122 Type 2 diabetes mellitus with diabetic chronic kidney disease: Secondary | ICD-10-CM | POA: Diagnosis not present

## 2019-12-17 DIAGNOSIS — I953 Hypotension of hemodialysis: Secondary | ICD-10-CM | POA: Diagnosis not present

## 2019-12-17 DIAGNOSIS — N2581 Secondary hyperparathyroidism of renal origin: Secondary | ICD-10-CM | POA: Diagnosis not present

## 2019-12-17 DIAGNOSIS — Z992 Dependence on renal dialysis: Secondary | ICD-10-CM | POA: Diagnosis not present

## 2019-12-17 DIAGNOSIS — N186 End stage renal disease: Secondary | ICD-10-CM | POA: Diagnosis not present

## 2019-12-18 DIAGNOSIS — N2581 Secondary hyperparathyroidism of renal origin: Secondary | ICD-10-CM | POA: Diagnosis not present

## 2019-12-18 DIAGNOSIS — Z992 Dependence on renal dialysis: Secondary | ICD-10-CM | POA: Diagnosis not present

## 2019-12-18 DIAGNOSIS — I953 Hypotension of hemodialysis: Secondary | ICD-10-CM | POA: Diagnosis not present

## 2019-12-18 DIAGNOSIS — N186 End stage renal disease: Secondary | ICD-10-CM | POA: Diagnosis not present

## 2019-12-20 DIAGNOSIS — Z7682 Awaiting organ transplant status: Secondary | ICD-10-CM | POA: Diagnosis not present

## 2019-12-20 DIAGNOSIS — N186 End stage renal disease: Secondary | ICD-10-CM | POA: Diagnosis not present

## 2019-12-20 DIAGNOSIS — I10 Essential (primary) hypertension: Secondary | ICD-10-CM | POA: Diagnosis not present

## 2019-12-20 DIAGNOSIS — I953 Hypotension of hemodialysis: Secondary | ICD-10-CM | POA: Diagnosis not present

## 2019-12-20 DIAGNOSIS — N2581 Secondary hyperparathyroidism of renal origin: Secondary | ICD-10-CM | POA: Diagnosis not present

## 2019-12-20 DIAGNOSIS — E0829 Diabetes mellitus due to underlying condition with other diabetic kidney complication: Secondary | ICD-10-CM | POA: Diagnosis not present

## 2019-12-20 DIAGNOSIS — Z992 Dependence on renal dialysis: Secondary | ICD-10-CM | POA: Diagnosis not present

## 2019-12-20 DIAGNOSIS — Z79899 Other long term (current) drug therapy: Secondary | ICD-10-CM | POA: Diagnosis not present

## 2019-12-20 DIAGNOSIS — I362 Nonrheumatic tricuspid (valve) stenosis with insufficiency: Secondary | ICD-10-CM | POA: Diagnosis not present

## 2019-12-21 DIAGNOSIS — G4733 Obstructive sleep apnea (adult) (pediatric): Secondary | ICD-10-CM | POA: Diagnosis not present

## 2019-12-21 DIAGNOSIS — N2581 Secondary hyperparathyroidism of renal origin: Secondary | ICD-10-CM | POA: Diagnosis not present

## 2019-12-21 DIAGNOSIS — I953 Hypotension of hemodialysis: Secondary | ICD-10-CM | POA: Diagnosis not present

## 2019-12-21 DIAGNOSIS — N186 End stage renal disease: Secondary | ICD-10-CM | POA: Diagnosis not present

## 2019-12-21 DIAGNOSIS — Z992 Dependence on renal dialysis: Secondary | ICD-10-CM | POA: Diagnosis not present

## 2019-12-24 DIAGNOSIS — N2581 Secondary hyperparathyroidism of renal origin: Secondary | ICD-10-CM | POA: Diagnosis not present

## 2019-12-24 DIAGNOSIS — I953 Hypotension of hemodialysis: Secondary | ICD-10-CM | POA: Diagnosis not present

## 2019-12-24 DIAGNOSIS — Z992 Dependence on renal dialysis: Secondary | ICD-10-CM | POA: Diagnosis not present

## 2019-12-24 DIAGNOSIS — N186 End stage renal disease: Secondary | ICD-10-CM | POA: Diagnosis not present

## 2019-12-24 DIAGNOSIS — D631 Anemia in chronic kidney disease: Secondary | ICD-10-CM | POA: Diagnosis not present

## 2019-12-24 DIAGNOSIS — D509 Iron deficiency anemia, unspecified: Secondary | ICD-10-CM | POA: Diagnosis not present

## 2019-12-25 DIAGNOSIS — Z992 Dependence on renal dialysis: Secondary | ICD-10-CM | POA: Diagnosis not present

## 2019-12-25 DIAGNOSIS — N2581 Secondary hyperparathyroidism of renal origin: Secondary | ICD-10-CM | POA: Diagnosis not present

## 2019-12-25 DIAGNOSIS — D509 Iron deficiency anemia, unspecified: Secondary | ICD-10-CM | POA: Diagnosis not present

## 2019-12-25 DIAGNOSIS — N186 End stage renal disease: Secondary | ICD-10-CM | POA: Diagnosis not present

## 2019-12-25 DIAGNOSIS — D631 Anemia in chronic kidney disease: Secondary | ICD-10-CM | POA: Diagnosis not present

## 2019-12-25 DIAGNOSIS — I953 Hypotension of hemodialysis: Secondary | ICD-10-CM | POA: Diagnosis not present

## 2019-12-26 DIAGNOSIS — D631 Anemia in chronic kidney disease: Secondary | ICD-10-CM | POA: Diagnosis not present

## 2019-12-26 DIAGNOSIS — N186 End stage renal disease: Secondary | ICD-10-CM | POA: Diagnosis not present

## 2019-12-26 DIAGNOSIS — Z992 Dependence on renal dialysis: Secondary | ICD-10-CM | POA: Diagnosis not present

## 2019-12-26 DIAGNOSIS — I953 Hypotension of hemodialysis: Secondary | ICD-10-CM | POA: Diagnosis not present

## 2019-12-26 DIAGNOSIS — D509 Iron deficiency anemia, unspecified: Secondary | ICD-10-CM | POA: Diagnosis not present

## 2019-12-26 DIAGNOSIS — N2581 Secondary hyperparathyroidism of renal origin: Secondary | ICD-10-CM | POA: Diagnosis not present

## 2019-12-27 DIAGNOSIS — D509 Iron deficiency anemia, unspecified: Secondary | ICD-10-CM | POA: Diagnosis not present

## 2019-12-27 DIAGNOSIS — Z992 Dependence on renal dialysis: Secondary | ICD-10-CM | POA: Diagnosis not present

## 2019-12-27 DIAGNOSIS — N186 End stage renal disease: Secondary | ICD-10-CM | POA: Diagnosis not present

## 2019-12-27 DIAGNOSIS — D631 Anemia in chronic kidney disease: Secondary | ICD-10-CM | POA: Diagnosis not present

## 2019-12-27 DIAGNOSIS — I953 Hypotension of hemodialysis: Secondary | ICD-10-CM | POA: Diagnosis not present

## 2019-12-27 DIAGNOSIS — N2581 Secondary hyperparathyroidism of renal origin: Secondary | ICD-10-CM | POA: Diagnosis not present

## 2019-12-28 DIAGNOSIS — D631 Anemia in chronic kidney disease: Secondary | ICD-10-CM | POA: Diagnosis not present

## 2019-12-28 DIAGNOSIS — D509 Iron deficiency anemia, unspecified: Secondary | ICD-10-CM | POA: Diagnosis not present

## 2019-12-28 DIAGNOSIS — N186 End stage renal disease: Secondary | ICD-10-CM | POA: Diagnosis not present

## 2019-12-28 DIAGNOSIS — I953 Hypotension of hemodialysis: Secondary | ICD-10-CM | POA: Diagnosis not present

## 2019-12-28 DIAGNOSIS — N2581 Secondary hyperparathyroidism of renal origin: Secondary | ICD-10-CM | POA: Diagnosis not present

## 2019-12-28 DIAGNOSIS — Z992 Dependence on renal dialysis: Secondary | ICD-10-CM | POA: Diagnosis not present

## 2019-12-31 DIAGNOSIS — N186 End stage renal disease: Secondary | ICD-10-CM | POA: Diagnosis not present

## 2019-12-31 DIAGNOSIS — Z992 Dependence on renal dialysis: Secondary | ICD-10-CM | POA: Diagnosis not present

## 2019-12-31 DIAGNOSIS — I953 Hypotension of hemodialysis: Secondary | ICD-10-CM | POA: Diagnosis not present

## 2019-12-31 DIAGNOSIS — N2581 Secondary hyperparathyroidism of renal origin: Secondary | ICD-10-CM | POA: Diagnosis not present

## 2020-01-01 DIAGNOSIS — N186 End stage renal disease: Secondary | ICD-10-CM | POA: Diagnosis not present

## 2020-01-01 DIAGNOSIS — I953 Hypotension of hemodialysis: Secondary | ICD-10-CM | POA: Diagnosis not present

## 2020-01-01 DIAGNOSIS — N2581 Secondary hyperparathyroidism of renal origin: Secondary | ICD-10-CM | POA: Diagnosis not present

## 2020-01-01 DIAGNOSIS — Z992 Dependence on renal dialysis: Secondary | ICD-10-CM | POA: Diagnosis not present

## 2020-01-02 ENCOUNTER — Other Ambulatory Visit: Payer: Self-pay | Admitting: Internal Medicine

## 2020-01-02 DIAGNOSIS — E113511 Type 2 diabetes mellitus with proliferative diabetic retinopathy with macular edema, right eye: Secondary | ICD-10-CM | POA: Diagnosis not present

## 2020-01-02 DIAGNOSIS — E113512 Type 2 diabetes mellitus with proliferative diabetic retinopathy with macular edema, left eye: Secondary | ICD-10-CM | POA: Diagnosis not present

## 2020-01-03 DIAGNOSIS — N186 End stage renal disease: Secondary | ICD-10-CM | POA: Diagnosis not present

## 2020-01-03 DIAGNOSIS — Z992 Dependence on renal dialysis: Secondary | ICD-10-CM | POA: Diagnosis not present

## 2020-01-03 DIAGNOSIS — I953 Hypotension of hemodialysis: Secondary | ICD-10-CM | POA: Diagnosis not present

## 2020-01-03 DIAGNOSIS — N2581 Secondary hyperparathyroidism of renal origin: Secondary | ICD-10-CM | POA: Diagnosis not present

## 2020-01-04 DIAGNOSIS — Z992 Dependence on renal dialysis: Secondary | ICD-10-CM | POA: Diagnosis not present

## 2020-01-04 DIAGNOSIS — I953 Hypotension of hemodialysis: Secondary | ICD-10-CM | POA: Diagnosis not present

## 2020-01-04 DIAGNOSIS — N186 End stage renal disease: Secondary | ICD-10-CM | POA: Diagnosis not present

## 2020-01-04 DIAGNOSIS — N2581 Secondary hyperparathyroidism of renal origin: Secondary | ICD-10-CM | POA: Diagnosis not present

## 2020-01-07 ENCOUNTER — Ambulatory Visit (INDEPENDENT_AMBULATORY_CARE_PROVIDER_SITE_OTHER): Payer: BC Managed Care – PPO | Admitting: Family Medicine

## 2020-01-07 ENCOUNTER — Encounter (INDEPENDENT_AMBULATORY_CARE_PROVIDER_SITE_OTHER): Payer: Self-pay | Admitting: Family Medicine

## 2020-01-07 ENCOUNTER — Other Ambulatory Visit: Payer: Self-pay

## 2020-01-07 VITALS — BP 119/71 | HR 89 | Temp 98.1°F | Ht 63.0 in | Wt 225.0 lb

## 2020-01-07 DIAGNOSIS — Z992 Dependence on renal dialysis: Secondary | ICD-10-CM | POA: Diagnosis not present

## 2020-01-07 DIAGNOSIS — Z794 Long term (current) use of insulin: Secondary | ICD-10-CM

## 2020-01-07 DIAGNOSIS — E1122 Type 2 diabetes mellitus with diabetic chronic kidney disease: Secondary | ICD-10-CM | POA: Diagnosis not present

## 2020-01-07 DIAGNOSIS — D509 Iron deficiency anemia, unspecified: Secondary | ICD-10-CM | POA: Diagnosis not present

## 2020-01-07 DIAGNOSIS — N186 End stage renal disease: Secondary | ICD-10-CM

## 2020-01-07 DIAGNOSIS — Z6839 Body mass index (BMI) 39.0-39.9, adult: Secondary | ICD-10-CM | POA: Diagnosis not present

## 2020-01-07 DIAGNOSIS — I953 Hypotension of hemodialysis: Secondary | ICD-10-CM | POA: Diagnosis not present

## 2020-01-07 DIAGNOSIS — N2581 Secondary hyperparathyroidism of renal origin: Secondary | ICD-10-CM | POA: Diagnosis not present

## 2020-01-07 NOTE — Progress Notes (Signed)
Chief Complaint:   OBESITY Cassandra Campos is here to discuss her progress with her obesity treatment plan along with follow-up of her obesity related diagnoses. Cassandra Campos is on the Category 3 Plan and states she is following her eating plan approximately 50% of the time. Tomoko states she is doing Hip Hop class and walking for 30 minutes 4 times per week.  Today's visit was #: 5 Starting weight: 228 lbs Starting date: 08/30/2019 Today's weight: 225 lbs Today's date: 01/07/2020 Total lbs lost to date: 3 Total lbs lost since last in-office visit: 0  Interim History: Cassandra Campos feels she struggles to stick to the plan because some foods on the plan are not allowed due to her kidney status (ESRD/dialysis). She is limited to 32 oz of water daily. She must lose down to <200 lbs and have A1c of <8 to qualify for kidney transplant.   Subjective:   1. Type 2 diabetes mellitus with chronic kidney disease on chronic dialysis, with long-term current use of insulin (HCC) Cassandra Campos is on insulin and Ozempic (1 mg), managed by Dr. Cruzita Lederer. Last A1c was 8.7. She is taking Ozempic and insulin as ordered.  Lab Results  Component Value Date   HGBA1C 8.7 (A) 09/11/2019   HGBA1C 10.9 (H) 04/19/2019   HGBA1C 6.9 (H) 11/09/2018   Lab Results  Component Value Date   MICROALBUR 150 08/07/2019   LDLCALC 96 08/07/2019   CREATININE 5.58 (H) 07/17/2019   No results found for: INSULIN  Assessment/Plan:   1. Type 2 diabetes mellitus with chronic kidney disease on chronic dialysis, with long-term current use of insulin (HCC)  Dellanira will continue all her medications, and will continue to follow up with Dr. Cruzita Lederer.  2. Class 2 severe obesity with serious comorbidity and body mass index (BMI) of 39.0 to 39.9 in adult, unspecified obesity type (HCC) Cassandra Campos is currently in the action stage of change. As such, her goal is to continue with weight loss efforts. She has agreed to keeping a food journal and adhering to  recommended goals of 1400-1500 calories and 80 grams of protein daily.   Handouts given today: Journaling, Protein Content of Food, and Protein Exchanges.  Exercise goals: As is.  Behavioral modification strategies: increasing lean protein intake, decreasing simple carbohydrates and keeping a strict food journal.  Cassandra Campos has agreed to follow-up with our clinic in 3 weeks. She was informed of the importance of frequent follow-up visits to maximize her success with intensive lifestyle modifications for her multiple health conditions.   Objective:   Blood pressure 119/71, pulse 89, temperature 98.1 F (36.7 C), temperature source Oral, height 5\' 3"  (1.6 m), weight 225 lb (102.1 kg), SpO2 99 %. Body mass index is 39.86 kg/m.  General: Cooperative, alert, well developed, in no acute distress. HEENT: Conjunctivae and lids unremarkable. Cardiovascular: Regular rhythm.  Lungs: Normal work of breathing. Neurologic: No focal deficits.   Lab Results  Component Value Date   CREATININE 5.58 (H) 07/17/2019   BUN 35 (H) 07/17/2019   NA 136 07/17/2019   K 3.3 (L) 07/17/2019   CL 95 (L) 07/17/2019   CO2 26 07/17/2019   Lab Results  Component Value Date   ALT 32 07/17/2019   AST 27 07/17/2019   ALKPHOS 52 07/17/2019   BILITOT 0.8 07/17/2019   Lab Results  Component Value Date   HGBA1C 8.7 (A) 09/11/2019   HGBA1C 10.9 (H) 04/19/2019   HGBA1C 6.9 (H) 11/09/2018   HGBA1C 7.1 (H) 07/11/2018  HGBA1C 8.5 (H) 03/27/2018   No results found for: INSULIN Lab Results  Component Value Date   TSH 2.360 11/09/2018   Lab Results  Component Value Date   CHOL 192 08/07/2019   HDL 49 08/07/2019   LDLCALC 96 08/07/2019   TRIG 280 (H) 08/07/2019   CHOLHDL 3.9 08/07/2019   Lab Results  Component Value Date   WBC 7.4 07/17/2019   HGB 10.7 (L) 07/17/2019   HCT 31.3 (L) 07/17/2019   MCV 89.9 07/17/2019   PLT 153 07/17/2019   Lab Results  Component Value Date   IRON 56 03/15/2017   IRON  56 03/15/2017   FERRITIN 404.4 (H) 03/15/2017   Attestation Statements:   Reviewed by clinician on day of visit: allergies, medications, problem list, medical history, surgical history, family history, social history, and previous encounter notes.   Wilhemena Durie, am acting as Location manager for [employer], FNP-C.  I have reviewed the above documentation for accuracy and completeness, and I agree with the above. - Georgianne Fick, FNP

## 2020-01-08 ENCOUNTER — Ambulatory Visit (INDEPENDENT_AMBULATORY_CARE_PROVIDER_SITE_OTHER): Payer: BC Managed Care – PPO | Admitting: Internal Medicine

## 2020-01-08 ENCOUNTER — Encounter: Payer: Self-pay | Admitting: Internal Medicine

## 2020-01-08 ENCOUNTER — Encounter (INDEPENDENT_AMBULATORY_CARE_PROVIDER_SITE_OTHER): Payer: Self-pay | Admitting: Family Medicine

## 2020-01-08 VITALS — BP 134/72 | HR 102 | Temp 98.1°F | Ht 63.0 in | Wt 229.8 lb

## 2020-01-08 DIAGNOSIS — E1122 Type 2 diabetes mellitus with diabetic chronic kidney disease: Secondary | ICD-10-CM | POA: Diagnosis not present

## 2020-01-08 DIAGNOSIS — Z992 Dependence on renal dialysis: Secondary | ICD-10-CM | POA: Diagnosis not present

## 2020-01-08 DIAGNOSIS — G4733 Obstructive sleep apnea (adult) (pediatric): Secondary | ICD-10-CM

## 2020-01-08 DIAGNOSIS — Z6841 Body Mass Index (BMI) 40.0 and over, adult: Secondary | ICD-10-CM | POA: Diagnosis not present

## 2020-01-08 DIAGNOSIS — N186 End stage renal disease: Secondary | ICD-10-CM | POA: Diagnosis not present

## 2020-01-08 DIAGNOSIS — N2581 Secondary hyperparathyroidism of renal origin: Secondary | ICD-10-CM | POA: Diagnosis not present

## 2020-01-08 DIAGNOSIS — Z794 Long term (current) use of insulin: Secondary | ICD-10-CM

## 2020-01-08 DIAGNOSIS — I120 Hypertensive chronic kidney disease with stage 5 chronic kidney disease or end stage renal disease: Secondary | ICD-10-CM | POA: Diagnosis not present

## 2020-01-08 DIAGNOSIS — I953 Hypotension of hemodialysis: Secondary | ICD-10-CM | POA: Diagnosis not present

## 2020-01-08 DIAGNOSIS — D509 Iron deficiency anemia, unspecified: Secondary | ICD-10-CM | POA: Diagnosis not present

## 2020-01-08 NOTE — Progress Notes (Signed)
I,Tianna Badgett,acting as a Education administrator for Maximino Greenland, MD.,have documented all relevant documentation on the behalf of Maximino Greenland, MD,as directed by  Maximino Greenland, MD while in the presence of Maximino Greenland, MD.  This visit occurred during the SARS-CoV-2 public health emergency.  Safety protocols were in place, including screening questions prior to the visit, additional usage of staff PPE, and extensive cleaning of exam room while observing appropriate contact time as indicated for disinfecting solutions.  Subjective:     Patient ID: Cassandra Campos , female    DOB: 1970-08-20 , 49 y.o.   MRN: [id number]   Chief Complaint  Patient presents with  . Hypertension    HPI  Patient is here for hypertension follow up. She also would like to discuss weight loss medication. She admits that she is exercising now in a hip hop dance class. She states that she can not move up on the kidney donor list until she loses 30 pounds.     Past Medical History:  Diagnosis Date  . Anemia associated with chronic renal failure   . ASCUS of cervix with negative high risk HPV 06/2017  . Back pain   . Chest pain   . CHF (congestive heart failure) ([place])   . CKD (chronic kidney disease), stage IV ([place]) no dialysis yet   nephrologist-  dr Jamal Maes-- scheduled next visit 09-08-2017 (lov 07-05-2017)  . Constipation   . Diabetic retinopathy ([place])   . Dialysis patient ([place])   . Edema, lower extremity   . Elevated transaminase level   . Fatty liver    FOLLOWED BY DR Henrene Pastor  . GERD (gastroesophageal reflux disease)   . Hypertension   . Idiopathic gout of multiple sites    09-05-2017 per pt stable  . Insulin dependent type 2 diabetes mellitus ([place])    followed by dr Toy Care  . Joint pain   . Mixed hyperlipidemia   . OSA (obstructive sleep apnea)    per study 10-20-2015 mild osa w/ AHI 10.3/hr;  09-05-2017 per pt has not used cpap since 1 yr ago  . Palpitations   . Peripheral neuropathy     feet  . S/P arteriovenous (AV) fistula creation 07-04-2015  w/ revision 02-14-2017   left braniocephilic  . Shortness of breath   . Trigger thumb, right thumb   . Umbilical hernia   . Vitamin D deficiency      Family History  Problem Relation Age of Onset  . Diabetes Mother   . Hypertension Mother   . Thyroid disease Mother   . Diabetes Father   . Hypertension Father   . Cancer Father        STOMACH  . Stomach cancer Father   . Stroke Maternal Grandmother   . Stroke Maternal Grandfather   . Colon cancer Neg Hx   . Rectal cancer Neg Hx      Current Outpatient Medications:  .  allopurinol (ZYLOPRIM) 300 MG tablet, TAKE 1 TABLET BY MOUTH EVERY MORNING, Disp: 90 tablet, Rfl: 1 .  Armodafinil 150 MG tablet, Take 1 tablet (150 mg total) by mouth daily., Disp: 30 tablet, Rfl: 5 .  B Complex-C-Folic Acid (RENAL) 1 MG CAPS, TAKE 1 CAPSULE BY MOUTH DAILY (ON DIALYSIS DAYS, TAKE AFTER DIALYSIS TREATMENT ), Disp: , Rfl:  .  calcitRIOL (ROCALTROL) 0.5 MCG capsule, Take 0.5 mcg by mouth daily., Disp: , Rfl:  .  cinacalcet (SENSIPAR) 60 MG tablet, Take 60 mg by mouth  daily. Every other day, Disp: , Rfl:  .  cromolyn (OPTICROM) 4 % ophthalmic solution, 1 drop 4 (four) times daily., Disp: , Rfl:  .  HUMULIN R 500 UNIT/ML injection, Inject under skin up to 200 units in the morning and up to 150 units in the evening, Disp: 20 mL, Rfl: 11 .  Insulin Pen Needle 32G X 4 MM MISC, Use 2x a day, Disp: 200 each, Rfl: 3 .  lanthanum (FOSRENOL) 1000 MG chewable tablet, Chew 1,000 mg by mouth 3 (three) times daily with meals., Disp: , Rfl:  .  lidocaine-prilocaine (EMLA) cream, USE ON [place] AS NEEDED FOR DIALYSIS, Disp: , Rfl: 10 .  Microlet Lancets MISC, Use as directed to check blood sugars 4 times per day dx: e11.22, Disp: 300 each, Rfl: 2 .  MITIGARE 0.6 MG CAPS, TAKE ONE CAPSULE BY MOUTH ONCE DAILY, Disp: 90 capsule, Rfl: 0 .  neomycin-polymyxin-hydrocortisone (CORTISPORIN) OTIC solution, Apply 1-2  drops to the toe after soaking toe twice a day, Disp: 10 mL, Rfl: 0 .  pregabalin (LYRICA) 100 MG capsule, TAKE 1 CAPSULE BY MOUTH IN THE MORNING FOR NEUROPATHY, Disp: 90 capsule, Rfl: 1 .  pregabalin (LYRICA) 75 MG capsule, Take one capsule by mouth at bedtime, Disp: 90 capsule, Rfl: 1 .  RESTASIS 0.05 % ophthalmic emulsion, INSTILL 1 DROP INTO BOTH EYES TWICE A DAY, Disp: , Rfl: 3 .  rosuvastatin (CRESTOR) 20 MG tablet, TAKE 1 TABLET BY MOUTH EVERY DAY, Disp: 90 tablet, Rfl: 1 .  Semaglutide, 1 MG/DOSE, (OZEMPIC, 1 MG/DOSE,) 4 MG/3ML SOPN, Inject 0.75 mLs (1 mg total) into the skin once a week., Disp: 9 mL, Rfl: 3   No Known Allergies   Review of Systems  Constitutional: Negative.   Respiratory: Negative.   Cardiovascular: Negative.   Gastrointestinal: Negative.   Neurological: Negative.      Today's Vitals   01/08/20 1541  BP: 134/72  Pulse: (!) 102  Temp: 98.1 F (36.7 C)  TempSrc: Oral  Weight: 229 lb 12.8 oz (104.2 kg)  Height: 5\' 3"  (1.6 m)  PainSc: 0-No pain   Body mass index is 40.71 kg/m.   Objective:  Physical Exam Vitals and nursing note reviewed.  Constitutional:      Appearance: Normal appearance. She is obese.  HENT:     Head: Normocephalic and atraumatic.  Cardiovascular:     Rate and Rhythm: Normal rate and regular rhythm.     Heart sounds: Normal heart sounds.  Pulmonary:     Effort: Pulmonary effort is normal.     Breath sounds: Normal breath sounds.  Skin:    General: Skin is warm.  Neurological:     General: No focal deficit present.     Mental Status: She is alert.  Psychiatric:        Mood and Affect: Mood normal.        Behavior: Behavior normal.         Assessment And Plan:     1. 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 She will continue with current meds for now. Encouraged to avoid adding salt to her foods.   2. Type 2 diabetes  mellitus with chronic kidney disease on chronic dialysis, with long-term current use of insulin (HCC) Comments: She is now followed by Dr. Cruzita Lederer. I will check an a1c as requested.  - Hemoglobin A1c  3. Class 3 severe obesity due to  excess calories with serious comorbidity and body mass index (BMI) of 40.0 to 44.9 in adult [facility]) Comments: BMI 40. She must lose 30 pounds to be considered for renal transplant. Encouraged to aim for at least 30 minutes of exercise at least 5 days/week.   4. OSA (obstructive sleep apnea) Comments: Chronic, yet stable. Encouraged to use CPAP nightly for at least four hours per night.  She is encouraged to strive for BMI less than 30 to decrease cardiac risk. Advised to aim for at least 150 minutes of exercise per week. Wt Readings from Last 3 Encounters:  01/08/20 229 lb 12.8 oz (104.2 kg)  01/07/20 225 lb (102.1 kg)  12/10/19 (!) 225 lb (102.1 kg)      Patient was given opportunity to ask questions. Patient verbalized understanding of the plan and was able to repeat key elements of the plan. All questions were answered to their satisfaction.  Maximino Greenland, MD   I, Maximino Greenland, MD, have reviewed all documentation for this visit. The documentation on 01/14/20 for the exam, diagnosis, procedures, and orders are all accurate and complete.  THE PATIENT IS ENCOURAGED TO PRACTICE SOCIAL DISTANCING DUE TO THE COVID-19 PANDEMIC.

## 2020-01-08 NOTE — Patient Instructions (Signed)

## 2020-01-09 DIAGNOSIS — Z992 Dependence on renal dialysis: Secondary | ICD-10-CM | POA: Diagnosis not present

## 2020-01-09 DIAGNOSIS — I953 Hypotension of hemodialysis: Secondary | ICD-10-CM | POA: Diagnosis not present

## 2020-01-09 DIAGNOSIS — D509 Iron deficiency anemia, unspecified: Secondary | ICD-10-CM | POA: Diagnosis not present

## 2020-01-09 DIAGNOSIS — N2581 Secondary hyperparathyroidism of renal origin: Secondary | ICD-10-CM | POA: Diagnosis not present

## 2020-01-09 DIAGNOSIS — N186 End stage renal disease: Secondary | ICD-10-CM | POA: Diagnosis not present

## 2020-01-09 LAB — HEMOGLOBIN A1C
Est. average glucose Bld gHb Est-mCnc: 180 mg/dL
Hgb A1c MFr Bld: 7.9 % — ABNORMAL HIGH (ref 4.8–5.6)

## 2020-01-10 DIAGNOSIS — N2581 Secondary hyperparathyroidism of renal origin: Secondary | ICD-10-CM | POA: Diagnosis not present

## 2020-01-10 DIAGNOSIS — D509 Iron deficiency anemia, unspecified: Secondary | ICD-10-CM | POA: Diagnosis not present

## 2020-01-10 DIAGNOSIS — N186 End stage renal disease: Secondary | ICD-10-CM | POA: Diagnosis not present

## 2020-01-10 DIAGNOSIS — I953 Hypotension of hemodialysis: Secondary | ICD-10-CM | POA: Diagnosis not present

## 2020-01-10 DIAGNOSIS — Z992 Dependence on renal dialysis: Secondary | ICD-10-CM | POA: Diagnosis not present

## 2020-01-11 DIAGNOSIS — N186 End stage renal disease: Secondary | ICD-10-CM | POA: Diagnosis not present

## 2020-01-11 DIAGNOSIS — N2581 Secondary hyperparathyroidism of renal origin: Secondary | ICD-10-CM | POA: Diagnosis not present

## 2020-01-11 DIAGNOSIS — D509 Iron deficiency anemia, unspecified: Secondary | ICD-10-CM | POA: Diagnosis not present

## 2020-01-11 DIAGNOSIS — I953 Hypotension of hemodialysis: Secondary | ICD-10-CM | POA: Diagnosis not present

## 2020-01-11 DIAGNOSIS — Z992 Dependence on renal dialysis: Secondary | ICD-10-CM | POA: Diagnosis not present

## 2020-01-14 DIAGNOSIS — N2581 Secondary hyperparathyroidism of renal origin: Secondary | ICD-10-CM | POA: Diagnosis not present

## 2020-01-14 DIAGNOSIS — Z992 Dependence on renal dialysis: Secondary | ICD-10-CM | POA: Diagnosis not present

## 2020-01-14 DIAGNOSIS — N186 End stage renal disease: Secondary | ICD-10-CM | POA: Diagnosis not present

## 2020-01-14 DIAGNOSIS — I953 Hypotension of hemodialysis: Secondary | ICD-10-CM | POA: Diagnosis not present

## 2020-01-15 DIAGNOSIS — E1122 Type 2 diabetes mellitus with diabetic chronic kidney disease: Secondary | ICD-10-CM | POA: Diagnosis not present

## 2020-01-15 DIAGNOSIS — I953 Hypotension of hemodialysis: Secondary | ICD-10-CM | POA: Diagnosis not present

## 2020-01-15 DIAGNOSIS — Z992 Dependence on renal dialysis: Secondary | ICD-10-CM | POA: Diagnosis not present

## 2020-01-15 DIAGNOSIS — N186 End stage renal disease: Secondary | ICD-10-CM | POA: Diagnosis not present

## 2020-01-15 DIAGNOSIS — N2581 Secondary hyperparathyroidism of renal origin: Secondary | ICD-10-CM | POA: Diagnosis not present

## 2020-01-17 DIAGNOSIS — N2581 Secondary hyperparathyroidism of renal origin: Secondary | ICD-10-CM | POA: Diagnosis not present

## 2020-01-17 DIAGNOSIS — N186 End stage renal disease: Secondary | ICD-10-CM | POA: Diagnosis not present

## 2020-01-17 DIAGNOSIS — I953 Hypotension of hemodialysis: Secondary | ICD-10-CM | POA: Diagnosis not present

## 2020-01-17 DIAGNOSIS — Z992 Dependence on renal dialysis: Secondary | ICD-10-CM | POA: Diagnosis not present

## 2020-01-18 DIAGNOSIS — N2581 Secondary hyperparathyroidism of renal origin: Secondary | ICD-10-CM | POA: Diagnosis not present

## 2020-01-18 DIAGNOSIS — N186 End stage renal disease: Secondary | ICD-10-CM | POA: Diagnosis not present

## 2020-01-18 DIAGNOSIS — I953 Hypotension of hemodialysis: Secondary | ICD-10-CM | POA: Diagnosis not present

## 2020-01-18 DIAGNOSIS — Z992 Dependence on renal dialysis: Secondary | ICD-10-CM | POA: Diagnosis not present

## 2020-01-21 DIAGNOSIS — N186 End stage renal disease: Secondary | ICD-10-CM | POA: Diagnosis not present

## 2020-01-21 DIAGNOSIS — N2581 Secondary hyperparathyroidism of renal origin: Secondary | ICD-10-CM | POA: Diagnosis not present

## 2020-01-21 DIAGNOSIS — I953 Hypotension of hemodialysis: Secondary | ICD-10-CM | POA: Diagnosis not present

## 2020-01-21 DIAGNOSIS — Z992 Dependence on renal dialysis: Secondary | ICD-10-CM | POA: Diagnosis not present

## 2020-01-21 DIAGNOSIS — G4733 Obstructive sleep apnea (adult) (pediatric): Secondary | ICD-10-CM | POA: Diagnosis not present

## 2020-01-22 DIAGNOSIS — I953 Hypotension of hemodialysis: Secondary | ICD-10-CM | POA: Diagnosis not present

## 2020-01-22 DIAGNOSIS — N186 End stage renal disease: Secondary | ICD-10-CM | POA: Diagnosis not present

## 2020-01-22 DIAGNOSIS — Z992 Dependence on renal dialysis: Secondary | ICD-10-CM | POA: Diagnosis not present

## 2020-01-22 DIAGNOSIS — N2581 Secondary hyperparathyroidism of renal origin: Secondary | ICD-10-CM | POA: Diagnosis not present

## 2020-01-23 ENCOUNTER — Other Ambulatory Visit: Payer: Self-pay | Admitting: Internal Medicine

## 2020-01-23 NOTE — Telephone Encounter (Signed)
Lyrica refill

## 2020-01-24 DIAGNOSIS — N186 End stage renal disease: Secondary | ICD-10-CM | POA: Diagnosis not present

## 2020-01-24 DIAGNOSIS — N2581 Secondary hyperparathyroidism of renal origin: Secondary | ICD-10-CM | POA: Diagnosis not present

## 2020-01-24 DIAGNOSIS — I953 Hypotension of hemodialysis: Secondary | ICD-10-CM | POA: Diagnosis not present

## 2020-01-24 DIAGNOSIS — Z992 Dependence on renal dialysis: Secondary | ICD-10-CM | POA: Diagnosis not present

## 2020-01-25 DIAGNOSIS — N2581 Secondary hyperparathyroidism of renal origin: Secondary | ICD-10-CM | POA: Diagnosis not present

## 2020-01-25 DIAGNOSIS — Z992 Dependence on renal dialysis: Secondary | ICD-10-CM | POA: Diagnosis not present

## 2020-01-25 DIAGNOSIS — N186 End stage renal disease: Secondary | ICD-10-CM | POA: Diagnosis not present

## 2020-01-25 DIAGNOSIS — I953 Hypotension of hemodialysis: Secondary | ICD-10-CM | POA: Diagnosis not present

## 2020-01-28 DIAGNOSIS — I953 Hypotension of hemodialysis: Secondary | ICD-10-CM | POA: Diagnosis not present

## 2020-01-28 DIAGNOSIS — Z992 Dependence on renal dialysis: Secondary | ICD-10-CM | POA: Diagnosis not present

## 2020-01-28 DIAGNOSIS — N186 End stage renal disease: Secondary | ICD-10-CM | POA: Diagnosis not present

## 2020-01-28 DIAGNOSIS — N2581 Secondary hyperparathyroidism of renal origin: Secondary | ICD-10-CM | POA: Diagnosis not present

## 2020-01-29 DIAGNOSIS — N186 End stage renal disease: Secondary | ICD-10-CM | POA: Diagnosis not present

## 2020-01-29 DIAGNOSIS — N2581 Secondary hyperparathyroidism of renal origin: Secondary | ICD-10-CM | POA: Diagnosis not present

## 2020-01-29 DIAGNOSIS — I953 Hypotension of hemodialysis: Secondary | ICD-10-CM | POA: Diagnosis not present

## 2020-01-29 DIAGNOSIS — Z992 Dependence on renal dialysis: Secondary | ICD-10-CM | POA: Diagnosis not present

## 2020-01-31 ENCOUNTER — Other Ambulatory Visit: Payer: Self-pay

## 2020-01-31 ENCOUNTER — Encounter (INDEPENDENT_AMBULATORY_CARE_PROVIDER_SITE_OTHER): Payer: Self-pay | Admitting: Family Medicine

## 2020-01-31 ENCOUNTER — Ambulatory Visit (INDEPENDENT_AMBULATORY_CARE_PROVIDER_SITE_OTHER): Payer: BC Managed Care – PPO | Admitting: Family Medicine

## 2020-01-31 VITALS — BP 131/80 | HR 80 | Temp 97.9°F | Ht 63.0 in | Wt 226.0 lb

## 2020-01-31 DIAGNOSIS — Z6841 Body Mass Index (BMI) 40.0 and over, adult: Secondary | ICD-10-CM

## 2020-01-31 DIAGNOSIS — Z992 Dependence on renal dialysis: Secondary | ICD-10-CM | POA: Diagnosis not present

## 2020-01-31 DIAGNOSIS — N186 End stage renal disease: Secondary | ICD-10-CM

## 2020-01-31 DIAGNOSIS — E1122 Type 2 diabetes mellitus with diabetic chronic kidney disease: Secondary | ICD-10-CM

## 2020-01-31 DIAGNOSIS — I953 Hypotension of hemodialysis: Secondary | ICD-10-CM | POA: Diagnosis not present

## 2020-01-31 DIAGNOSIS — Z794 Long term (current) use of insulin: Secondary | ICD-10-CM

## 2020-01-31 DIAGNOSIS — N2581 Secondary hyperparathyroidism of renal origin: Secondary | ICD-10-CM | POA: Diagnosis not present

## 2020-02-01 DIAGNOSIS — N186 End stage renal disease: Secondary | ICD-10-CM | POA: Diagnosis not present

## 2020-02-01 DIAGNOSIS — N2581 Secondary hyperparathyroidism of renal origin: Secondary | ICD-10-CM | POA: Diagnosis not present

## 2020-02-01 DIAGNOSIS — I953 Hypotension of hemodialysis: Secondary | ICD-10-CM | POA: Diagnosis not present

## 2020-02-01 DIAGNOSIS — Z992 Dependence on renal dialysis: Secondary | ICD-10-CM | POA: Diagnosis not present

## 2020-02-04 ENCOUNTER — Telehealth: Payer: Self-pay | Admitting: Cardiovascular Disease

## 2020-02-04 DIAGNOSIS — Z992 Dependence on renal dialysis: Secondary | ICD-10-CM | POA: Diagnosis not present

## 2020-02-04 DIAGNOSIS — I953 Hypotension of hemodialysis: Secondary | ICD-10-CM | POA: Diagnosis not present

## 2020-02-04 DIAGNOSIS — N186 End stage renal disease: Secondary | ICD-10-CM | POA: Diagnosis not present

## 2020-02-04 DIAGNOSIS — N2581 Secondary hyperparathyroidism of renal origin: Secondary | ICD-10-CM | POA: Diagnosis not present

## 2020-02-04 NOTE — Telephone Encounter (Signed)
Pt c/o of Chest Pain: STAT if CP now or developed within 24 hours  1. Are you having CP right now? yes  2. Are you experiencing any other symptoms (ex. SOB, nausea, vomiting, sweating)? no  3. How long have you been experiencing CP? This morning  4. Is your CP continuous or coming and going? Comes and goes  5. Have you taken Nitroglycerin? No   Patient states she started having chest pain this morning. ?

## 2020-02-04 NOTE — Telephone Encounter (Addendum)
Patient reports chest pain since this morning in center of chest. She reports the pain comes and goes. No identifiable triggers. Pain occurs with rest. She denies shortness of breath, dizziness, lightheadedness, palpitations.   Patient has dialysis and she does treatment at 6pm at home  Advised she go to ED for work up of active chest pain but she declined  Scheduled for first available visit with cardiology PA on 9/22  Scheduled for routine visit with Dr. Gwenlyn Found 10/20  Message sent to MD as Juluis Rainier

## 2020-02-05 DIAGNOSIS — N2581 Secondary hyperparathyroidism of renal origin: Secondary | ICD-10-CM | POA: Diagnosis not present

## 2020-02-05 DIAGNOSIS — I953 Hypotension of hemodialysis: Secondary | ICD-10-CM | POA: Diagnosis not present

## 2020-02-05 DIAGNOSIS — N186 End stage renal disease: Secondary | ICD-10-CM | POA: Diagnosis not present

## 2020-02-05 DIAGNOSIS — Z992 Dependence on renal dialysis: Secondary | ICD-10-CM | POA: Diagnosis not present

## 2020-02-05 NOTE — Progress Notes (Signed)
Chief Complaint:   OBESITY Cassandra Campos is here to discuss her progress with her obesity treatment plan along with follow-up of her obesity related diagnoses. Cassandra Campos is on the Category 3 Plan and states she is following her eating plan approximately 20% of the time. Cassandra Campos states she is walking for 15 minutes 3 times per week.  Today's visit was #: 6 Starting weight: 228 lbs Starting date: 08/30/2019 Today's weight: 226 lbs Today's date: 01/31/2020 Total lbs lost to date: 2 lbs Total lbs lost since last in-office visit: 0 Total weight loss percentage to date: -0.88%  Interim History: Cassandra Campos is frustrated with lack of weight loss, food restrictions. Plan: Log foods - MFP and increase her activity. New Goals:  1500 calories, >60 grams of protein, 25 grams fiber. We will discuss increasing protein as she is more comfortable. Since on HD, needs more protein. Will monitor potassium, phosphorous, calcium. Will monitor blood sugars.   Assessment/Plan:   1. Type 2 diabetes mellitus with chronic kidney disease on chronic dialysis, with long-term current use of insulin (HCC) Diabetes Mellitus: needs improvement Issues reviewed with her: blood sugar goals, hypoglycemia prevention and treatment, exercise, nutrition, carbohydrate counting.  Lab Results  Component Value Date   HGBA1C 7.9 (H) 01/08/2020   HGBA1C 8.7 (A) 09/11/2019   HGBA1C 10.9 (H) 04/19/2019   Lab Results  Component Value Date   MICROALBUR 150 08/07/2019   LDLCALC 96 08/07/2019   CREATININE 5.58 (H) 07/17/2019   CKD, HD four days per week, on transplant list. We discussed ESRD diet and goals today. Will recommend free online recipe books: [employer].   2. Class 3 severe obesity with serious comorbidity and body mass index (BMI) of 40.0 to 44.9 in adult, unspecified obesity type (HCC) Cassandra Campos is currently in the action stage of change. As such, her goal is to continue with weight loss efforts. She has  agreed to keeping a food journal and adhering to recommended goals of 1500 calories and 60 grams of protein (dialysis patient).   Exercise goals: For substantial health benefits, adults should do at least 150 minutes (2 hours and 30 minutes) a week of moderate-intensity, or 75 minutes (1 hour and 15 minutes) a week of vigorous-intensity aerobic physical activity, or an equivalent combination of moderate- and vigorous-intensity aerobic activity. Aerobic activity should be performed in episodes of at least 10 minutes, and preferably, it should be spread throughout the week.  Behavioral modification strategies: decreasing simple carbohydrates, increasing vegetables, decreasing sodium intake and increasing high fiber foods.  Cassandra Campos has agreed to follow-up with our clinic in 2-3 weeks. She was informed of the importance of frequent follow-up visits to maximize her success with intensive lifestyle modifications for her multiple health conditions.   Objective:   Blood pressure 131/80, pulse 80, temperature 97.9 F (36.6 C), temperature source Oral, height 5\' 3"  (1.6 m), weight 226 lb (102.5 kg), SpO2 100 %. Body mass index is 40.03 kg/m.  General: Cooperative, alert, well developed, in no acute distress. HEENT: Conjunctivae and lids unremarkable. Cardiovascular: Regular rhythm.  Lungs: Normal work of breathing. Neurologic: No focal deficits.   Lab Results  Component Value Date   CREATININE 5.58 (H) 07/17/2019   BUN 35 (H) 07/17/2019   NA 136 07/17/2019   K 3.3 (L) 07/17/2019   CL 95 (L) 07/17/2019   CO2 26 07/17/2019   Lab Results  Component Value Date   ALT 32 07/17/2019   AST 27 07/17/2019   ALKPHOS 52 07/17/2019  BILITOT 0.8 07/17/2019   Lab Results  Component Value Date   HGBA1C 7.9 (H) 01/08/2020   HGBA1C 8.7 (A) 09/11/2019   HGBA1C 10.9 (H) 04/19/2019   HGBA1C 6.9 (H) 11/09/2018   HGBA1C 7.1 (H) 07/11/2018   Lab Results  Component Value Date   TSH 2.360 11/09/2018    Lab Results  Component Value Date   CHOL 192 08/07/2019   HDL 49 08/07/2019   LDLCALC 96 08/07/2019   TRIG 280 (H) 08/07/2019   CHOLHDL 3.9 08/07/2019   Lab Results  Component Value Date   WBC 7.4 07/17/2019   HGB 10.7 (L) 07/17/2019   HCT 31.3 (L) 07/17/2019   MCV 89.9 07/17/2019   PLT 153 07/17/2019   Lab Results  Component Value Date   IRON 56 03/15/2017   IRON 56 03/15/2017   FERRITIN 404.4 (H) 03/15/2017   Attestation Statements:   Reviewed by clinician on day of visit: allergies, medications, problem list, medical history, surgical history, family history, social history, and previous encounter notes.  Time spent on visit including pre-visit chart review and post-visit care and charting was 40 minutes.   I, Water quality scientist, CMA, am acting as transcriptionist for Briscoe Deutscher, DO  I have reviewed the above documentation for accuracy and completeness, and I agree with the above. Briscoe Deutscher, DO

## 2020-02-06 ENCOUNTER — Ambulatory Visit: Payer: Medicaid Other | Admitting: Physician Assistant

## 2020-02-07 DIAGNOSIS — I953 Hypotension of hemodialysis: Secondary | ICD-10-CM | POA: Diagnosis not present

## 2020-02-07 DIAGNOSIS — Z992 Dependence on renal dialysis: Secondary | ICD-10-CM | POA: Diagnosis not present

## 2020-02-07 DIAGNOSIS — N186 End stage renal disease: Secondary | ICD-10-CM | POA: Diagnosis not present

## 2020-02-07 DIAGNOSIS — N2581 Secondary hyperparathyroidism of renal origin: Secondary | ICD-10-CM | POA: Diagnosis not present

## 2020-02-08 DIAGNOSIS — Z992 Dependence on renal dialysis: Secondary | ICD-10-CM | POA: Diagnosis not present

## 2020-02-08 DIAGNOSIS — I953 Hypotension of hemodialysis: Secondary | ICD-10-CM | POA: Diagnosis not present

## 2020-02-08 DIAGNOSIS — N2581 Secondary hyperparathyroidism of renal origin: Secondary | ICD-10-CM | POA: Diagnosis not present

## 2020-02-08 DIAGNOSIS — N186 End stage renal disease: Secondary | ICD-10-CM | POA: Diagnosis not present

## 2020-02-11 DIAGNOSIS — N186 End stage renal disease: Secondary | ICD-10-CM | POA: Diagnosis not present

## 2020-02-11 DIAGNOSIS — I953 Hypotension of hemodialysis: Secondary | ICD-10-CM | POA: Diagnosis not present

## 2020-02-11 DIAGNOSIS — Z992 Dependence on renal dialysis: Secondary | ICD-10-CM | POA: Diagnosis not present

## 2020-02-11 DIAGNOSIS — N2581 Secondary hyperparathyroidism of renal origin: Secondary | ICD-10-CM | POA: Diagnosis not present

## 2020-02-12 DIAGNOSIS — I953 Hypotension of hemodialysis: Secondary | ICD-10-CM | POA: Diagnosis not present

## 2020-02-12 DIAGNOSIS — N186 End stage renal disease: Secondary | ICD-10-CM | POA: Diagnosis not present

## 2020-02-12 DIAGNOSIS — N2581 Secondary hyperparathyroidism of renal origin: Secondary | ICD-10-CM | POA: Diagnosis not present

## 2020-02-12 DIAGNOSIS — Z992 Dependence on renal dialysis: Secondary | ICD-10-CM | POA: Diagnosis not present

## 2020-02-13 DIAGNOSIS — Z992 Dependence on renal dialysis: Secondary | ICD-10-CM | POA: Diagnosis not present

## 2020-02-13 DIAGNOSIS — Z23 Encounter for immunization: Secondary | ICD-10-CM | POA: Diagnosis not present

## 2020-02-13 DIAGNOSIS — N186 End stage renal disease: Secondary | ICD-10-CM | POA: Diagnosis not present

## 2020-02-14 DIAGNOSIS — I953 Hypotension of hemodialysis: Secondary | ICD-10-CM | POA: Diagnosis not present

## 2020-02-14 DIAGNOSIS — E1122 Type 2 diabetes mellitus with diabetic chronic kidney disease: Secondary | ICD-10-CM | POA: Diagnosis not present

## 2020-02-14 DIAGNOSIS — Z992 Dependence on renal dialysis: Secondary | ICD-10-CM | POA: Diagnosis not present

## 2020-02-14 DIAGNOSIS — N2581 Secondary hyperparathyroidism of renal origin: Secondary | ICD-10-CM | POA: Diagnosis not present

## 2020-02-14 DIAGNOSIS — N186 End stage renal disease: Secondary | ICD-10-CM | POA: Diagnosis not present

## 2020-02-15 DIAGNOSIS — N2581 Secondary hyperparathyroidism of renal origin: Secondary | ICD-10-CM | POA: Diagnosis not present

## 2020-02-15 DIAGNOSIS — Z992 Dependence on renal dialysis: Secondary | ICD-10-CM | POA: Diagnosis not present

## 2020-02-15 DIAGNOSIS — I953 Hypotension of hemodialysis: Secondary | ICD-10-CM | POA: Diagnosis not present

## 2020-02-15 DIAGNOSIS — N186 End stage renal disease: Secondary | ICD-10-CM | POA: Diagnosis not present

## 2020-02-18 DIAGNOSIS — N186 End stage renal disease: Secondary | ICD-10-CM | POA: Diagnosis not present

## 2020-02-18 DIAGNOSIS — Z992 Dependence on renal dialysis: Secondary | ICD-10-CM | POA: Diagnosis not present

## 2020-02-18 DIAGNOSIS — N2581 Secondary hyperparathyroidism of renal origin: Secondary | ICD-10-CM | POA: Diagnosis not present

## 2020-02-18 DIAGNOSIS — I953 Hypotension of hemodialysis: Secondary | ICD-10-CM | POA: Diagnosis not present

## 2020-02-19 DIAGNOSIS — N186 End stage renal disease: Secondary | ICD-10-CM | POA: Diagnosis not present

## 2020-02-19 DIAGNOSIS — I953 Hypotension of hemodialysis: Secondary | ICD-10-CM | POA: Diagnosis not present

## 2020-02-19 DIAGNOSIS — Z992 Dependence on renal dialysis: Secondary | ICD-10-CM | POA: Diagnosis not present

## 2020-02-19 DIAGNOSIS — N2581 Secondary hyperparathyroidism of renal origin: Secondary | ICD-10-CM | POA: Diagnosis not present

## 2020-02-20 DIAGNOSIS — G4733 Obstructive sleep apnea (adult) (pediatric): Secondary | ICD-10-CM | POA: Diagnosis not present

## 2020-02-21 DIAGNOSIS — N186 End stage renal disease: Secondary | ICD-10-CM | POA: Diagnosis not present

## 2020-02-21 DIAGNOSIS — N2581 Secondary hyperparathyroidism of renal origin: Secondary | ICD-10-CM | POA: Diagnosis not present

## 2020-02-21 DIAGNOSIS — I953 Hypotension of hemodialysis: Secondary | ICD-10-CM | POA: Diagnosis not present

## 2020-02-21 DIAGNOSIS — Z992 Dependence on renal dialysis: Secondary | ICD-10-CM | POA: Diagnosis not present

## 2020-02-22 DIAGNOSIS — Z992 Dependence on renal dialysis: Secondary | ICD-10-CM | POA: Diagnosis not present

## 2020-02-22 DIAGNOSIS — N2581 Secondary hyperparathyroidism of renal origin: Secondary | ICD-10-CM | POA: Diagnosis not present

## 2020-02-22 DIAGNOSIS — I953 Hypotension of hemodialysis: Secondary | ICD-10-CM | POA: Diagnosis not present

## 2020-02-22 DIAGNOSIS — N186 End stage renal disease: Secondary | ICD-10-CM | POA: Diagnosis not present

## 2020-02-25 ENCOUNTER — Other Ambulatory Visit: Payer: Self-pay

## 2020-02-25 ENCOUNTER — Encounter (INDEPENDENT_AMBULATORY_CARE_PROVIDER_SITE_OTHER): Payer: Self-pay | Admitting: Family Medicine

## 2020-02-25 ENCOUNTER — Ambulatory Visit (INDEPENDENT_AMBULATORY_CARE_PROVIDER_SITE_OTHER): Payer: BC Managed Care – PPO | Admitting: Family Medicine

## 2020-02-25 VITALS — BP 121/76 | HR 83 | Temp 98.0°F | Ht 63.0 in | Wt 227.0 lb

## 2020-02-25 DIAGNOSIS — I953 Hypotension of hemodialysis: Secondary | ICD-10-CM | POA: Diagnosis not present

## 2020-02-25 DIAGNOSIS — E1169 Type 2 diabetes mellitus with other specified complication: Secondary | ICD-10-CM | POA: Diagnosis not present

## 2020-02-25 DIAGNOSIS — Z992 Dependence on renal dialysis: Secondary | ICD-10-CM | POA: Diagnosis not present

## 2020-02-25 DIAGNOSIS — N186 End stage renal disease: Secondary | ICD-10-CM | POA: Diagnosis not present

## 2020-02-25 DIAGNOSIS — Z794 Long term (current) use of insulin: Secondary | ICD-10-CM

## 2020-02-25 DIAGNOSIS — E785 Hyperlipidemia, unspecified: Secondary | ICD-10-CM | POA: Diagnosis not present

## 2020-02-25 DIAGNOSIS — Z6841 Body Mass Index (BMI) 40.0 and over, adult: Secondary | ICD-10-CM

## 2020-02-25 DIAGNOSIS — N2581 Secondary hyperparathyroidism of renal origin: Secondary | ICD-10-CM | POA: Diagnosis not present

## 2020-02-25 NOTE — Patient Instructions (Addendum)
Kidney-friendly meal planning involves: balancing needed macronutrients, maintaining a safe calorie deficit, and limiting fluids and certain minerals (sodium, potassium, and phosphorous).   Resources: My favorite website is: [employer]. You can download free kidney-friendly cookbooks, along with other great resources.   Other free resources: Kidney Cooking: A Family Friendly Recipe Book for Kidney Patients National Kidney Diet: Dish Up a Dialysis-Friendly [employer] Kidney Diet: Dish Up a Kidney-Friendly Meal  Tips for Limiting sodium, potassium, and phosphorous Choose:  . Foods with 200 mg or less sodium per serving . Frozen or packaged meals with 600 mg or less sodium per serving . Foods that do not list "potassium chloride" ingredients . Lower sodium condiments, such as horseradish and yellow mustard Limit: . Any foods with added phosphorous (any words with "phos," such as calcium phosphate, in the ingredients) . Pickled foods, such as olives, sauerkraut, pickles, and kimchi . Soy sauce, barbeque sauce, ketchup, teriyaki sauce, salsa, tomato sauce or paste, and other high-sodium sauces . Salt added to foods when cooking or at the table . Canned soups or soup mixes, packaged foods, and processed foods (such as box mixes, fast food, frozen meals, gas station foods, processed meats and cheeses, vending machine foods, and other convenience foods Tips: . Eat home-cooked meals made from fresh ingredients . Use no salt added stocks or broths instead of regular soups, canned soups, or bouillon

## 2020-02-26 DIAGNOSIS — E113512 Type 2 diabetes mellitus with proliferative diabetic retinopathy with macular edema, left eye: Secondary | ICD-10-CM | POA: Diagnosis not present

## 2020-02-26 DIAGNOSIS — N2581 Secondary hyperparathyroidism of renal origin: Secondary | ICD-10-CM | POA: Diagnosis not present

## 2020-02-26 DIAGNOSIS — I953 Hypotension of hemodialysis: Secondary | ICD-10-CM | POA: Diagnosis not present

## 2020-02-26 DIAGNOSIS — N186 End stage renal disease: Secondary | ICD-10-CM | POA: Diagnosis not present

## 2020-02-26 DIAGNOSIS — E113511 Type 2 diabetes mellitus with proliferative diabetic retinopathy with macular edema, right eye: Secondary | ICD-10-CM | POA: Diagnosis not present

## 2020-02-26 DIAGNOSIS — Z992 Dependence on renal dialysis: Secondary | ICD-10-CM | POA: Diagnosis not present

## 2020-02-26 NOTE — Progress Notes (Signed)
Chief Complaint:   OBESITY Cassandra Campos is here to discuss her progress with her obesity treatment plan along with follow-up of her obesity related diagnoses. Cassandra Campos is on the Category 3 Plan and states she is following her eating plan approximately an unknown percentage of the time. Cassandra Campos states she is walking for 15 minutes 3 times per week.  Today's visit was #: 7 Starting weight: 228 lbs Starting date: 08/30/2019 Today's weight: 227 lbs Today's date: 02/25/2020 Total lbs lost to date: 1 lb Total lbs lost since last in-office visit: +1 lb Total weight loss percentage to date: -0.44%  Interim History: Cassandra Campos says she is tired.  She is frustrated with the lack of scale movement.   Assessment/Plan:   1. Type 2 diabetes mellitus with hyperlipidemia (HCC) Diabetes Mellitus: needs improvement. Medication: Humulin and Ozempic. Issues reviewed with her: blood sugar goals, complications of diabetes mellitus, hypoglycemia prevention and treatment, exercise, nutrition, and carbohydrate counting.   Lab Results  Component Value Date   HGBA1C 7.9 (H) 01/08/2020   HGBA1C 8.7 (A) 09/11/2019   HGBA1C 10.9 (H) 04/19/2019   Lab Results  Component Value Date   MICROALBUR 150 08/07/2019   LDLCALC 96 08/07/2019   CREATININE 5.58 (H) 07/17/2019   2. Class 3 severe obesity with serious comorbidity and body mass index (BMI) of 40.0 to 44.9 in adult, unspecified obesity type [facility])  Nutrition: We discussed a meal plan that is ESRD-friendly at length. See Patient Instructions.   Cassandra Campos is currently in the action stage of change. As such, her goal is to continue with weight loss efforts. She has agreed to keeping a food journal and adhering to recommended goals of 1200-1500 calories and 95 grams of protein.   Exercise goals: For substantial health benefits, adults should do at least 150 minutes (2 hours and 30 minutes) a week of moderate-intensity, or 75 minutes (1 hour and 15 minutes) a week of  vigorous-intensity aerobic physical activity, or an equivalent combination of moderate- and vigorous-intensity aerobic activity. Aerobic activity should be performed in episodes of at least 10 minutes, and preferably, it should be spread throughout the week.  Behavioral modification strategies: Discussed HD diet resources.  Cassandra Campos has agreed to follow-up with our clinic in 3 weeks. She was informed of the importance of frequent follow-up visits to maximize her success with intensive lifestyle modifications for her multiple health conditions.   Objective:   Blood pressure 121/76, pulse 83, temperature 98 F (36.7 C), temperature source Oral, height 5\' 3"  (1.6 m), weight 227 lb (103 kg), SpO2 100 %. Body mass index is 40.21 kg/m.  General: Cooperative, alert, well developed, in no acute distress. HEENT: Conjunctivae and lids unremarkable. Cardiovascular: Regular rhythm.  Lungs: Normal work of breathing. Neurologic: No focal deficits.   Lab Results  Component Value Date   CREATININE 5.58 (H) 07/17/2019   BUN 35 (H) 07/17/2019   NA 136 07/17/2019   K 3.3 (L) 07/17/2019   CL 95 (L) 07/17/2019   CO2 26 07/17/2019   Lab Results  Component Value Date   ALT 32 07/17/2019   AST 27 07/17/2019   ALKPHOS 52 07/17/2019   BILITOT 0.8 07/17/2019   Lab Results  Component Value Date   HGBA1C 7.9 (H) 01/08/2020   HGBA1C 8.7 (A) 09/11/2019   HGBA1C 10.9 (H) 04/19/2019   HGBA1C 6.9 (H) 11/09/2018   HGBA1C 7.1 (H) 07/11/2018   Lab Results  Component Value Date   TSH 2.360 11/09/2018   Lab  Results  Component Value Date   CHOL 192 08/07/2019   HDL 49 08/07/2019   LDLCALC 96 08/07/2019   TRIG 280 (H) 08/07/2019   CHOLHDL 3.9 08/07/2019   Lab Results  Component Value Date   WBC 7.4 07/17/2019   HGB 10.7 (L) 07/17/2019   HCT 31.3 (L) 07/17/2019   MCV 89.9 07/17/2019   PLT 153 07/17/2019   Lab Results  Component Value Date   IRON 56 03/15/2017   IRON 56 03/15/2017   FERRITIN  404.4 (H) 03/15/2017   Attestation Statements:   Reviewed by clinician on day of visit: allergies, medications, problem list, medical history, surgical history, family history, social history, and previous encounter notes.  Time spent on visit including pre-visit chart review and post-visit care and charting was 30 minutes.   I, Water quality scientist, CMA, am acting as transcriptionist for Briscoe Deutscher, DO  I have reviewed the above documentation for accuracy and completeness, and I agree with the above. Briscoe Deutscher, DO

## 2020-02-28 DIAGNOSIS — N186 End stage renal disease: Secondary | ICD-10-CM | POA: Diagnosis not present

## 2020-02-28 DIAGNOSIS — N2581 Secondary hyperparathyroidism of renal origin: Secondary | ICD-10-CM | POA: Diagnosis not present

## 2020-02-28 DIAGNOSIS — I953 Hypotension of hemodialysis: Secondary | ICD-10-CM | POA: Diagnosis not present

## 2020-02-28 DIAGNOSIS — Z992 Dependence on renal dialysis: Secondary | ICD-10-CM | POA: Diagnosis not present

## 2020-02-29 DIAGNOSIS — Z992 Dependence on renal dialysis: Secondary | ICD-10-CM | POA: Diagnosis not present

## 2020-02-29 DIAGNOSIS — N2581 Secondary hyperparathyroidism of renal origin: Secondary | ICD-10-CM | POA: Diagnosis not present

## 2020-02-29 DIAGNOSIS — I953 Hypotension of hemodialysis: Secondary | ICD-10-CM | POA: Diagnosis not present

## 2020-02-29 DIAGNOSIS — N186 End stage renal disease: Secondary | ICD-10-CM | POA: Diagnosis not present

## 2020-03-03 DIAGNOSIS — N186 End stage renal disease: Secondary | ICD-10-CM | POA: Diagnosis not present

## 2020-03-03 DIAGNOSIS — Z992 Dependence on renal dialysis: Secondary | ICD-10-CM | POA: Diagnosis not present

## 2020-03-03 DIAGNOSIS — N2581 Secondary hyperparathyroidism of renal origin: Secondary | ICD-10-CM | POA: Diagnosis not present

## 2020-03-03 DIAGNOSIS — I953 Hypotension of hemodialysis: Secondary | ICD-10-CM | POA: Diagnosis not present

## 2020-03-04 DIAGNOSIS — N186 End stage renal disease: Secondary | ICD-10-CM | POA: Diagnosis not present

## 2020-03-04 DIAGNOSIS — N2581 Secondary hyperparathyroidism of renal origin: Secondary | ICD-10-CM | POA: Diagnosis not present

## 2020-03-04 DIAGNOSIS — M65341 Trigger finger, right ring finger: Secondary | ICD-10-CM | POA: Diagnosis not present

## 2020-03-04 DIAGNOSIS — I953 Hypotension of hemodialysis: Secondary | ICD-10-CM | POA: Diagnosis not present

## 2020-03-04 DIAGNOSIS — Z992 Dependence on renal dialysis: Secondary | ICD-10-CM | POA: Diagnosis not present

## 2020-03-05 ENCOUNTER — Other Ambulatory Visit: Payer: Self-pay | Admitting: Internal Medicine

## 2020-03-05 ENCOUNTER — Ambulatory Visit (INDEPENDENT_AMBULATORY_CARE_PROVIDER_SITE_OTHER): Payer: BC Managed Care – PPO | Admitting: Cardiovascular Disease

## 2020-03-05 ENCOUNTER — Encounter: Payer: Self-pay | Admitting: Cardiovascular Disease

## 2020-03-05 DIAGNOSIS — E785 Hyperlipidemia, unspecified: Secondary | ICD-10-CM

## 2020-03-05 DIAGNOSIS — G4733 Obstructive sleep apnea (adult) (pediatric): Secondary | ICD-10-CM | POA: Diagnosis not present

## 2020-03-05 DIAGNOSIS — E1159 Type 2 diabetes mellitus with other circulatory complications: Secondary | ICD-10-CM | POA: Diagnosis not present

## 2020-03-05 DIAGNOSIS — E1169 Type 2 diabetes mellitus with other specified complication: Secondary | ICD-10-CM

## 2020-03-05 DIAGNOSIS — R002 Palpitations: Secondary | ICD-10-CM

## 2020-03-05 DIAGNOSIS — I152 Hypertension secondary to endocrine disorders: Secondary | ICD-10-CM

## 2020-03-05 NOTE — Patient Instructions (Signed)
Medication Instructions:  No changes *If you need a refill on your cardiac medications before your next appointment, please call your pharmacy*   Lab Work: None ordered If you have labs (blood work) drawn today and your tests are completely normal, you will receive your results only by: Marland Kitchen MyChart Message (if you have MyChart) OR . A paper copy in the mail If you have any lab test that is abnormal or we need to change your treatment, we will call you to review the results.   Testing/Procedures: None ordered   Follow-Up: At [facility], you and your health needs are our priority.  As part of our continuing mission to provide you with exceptional heart care, we have created designated Provider Care Teams.  These Care Teams include your primary Cardiologist (physician) and Advanced Practice Providers (APPs -  Physician Assistants and Nurse Practitioners) who all work together to provide you with the care you need, when you need it.  We recommend signing up for the patient portal called "MyChart".  Sign up information is provided on this After Visit Summary.  MyChart is used to connect with patients for Virtual Visits (Telemedicine).  Patients are able to view lab/test results, encounter notes, upcoming appointments, etc.  Non-urgent messages can be sent to your provider as well.   To learn more about what you can do with MyChart, go to [email or web address].    Your next appointment:   12 month(s)  The format for your next appointment:   In Person  Provider:   You may see Quay Burow, MD or one of the following Advanced Practice Providers on your designated Care Team:    Kerin Ransom, PA-C  [place], [place]  Coletta Memos, [place]    Other Instructions A referral has been made to Dr. Leafy Ro for weight management.

## 2020-03-05 NOTE — Assessment & Plan Note (Signed)
History of morbid obesity with the desire to lose weight.  She needs to lose 30 pounds before she will be considered for renal transplant at [facility].  I am going to refer her to Dr. [facility]y Ro at the [facility] for assistance of weight loss.

## 2020-03-05 NOTE — Progress Notes (Signed)
03/05/2020 Nara Visa   11/13/1970  [id number]  Primary Physician Glendale Chard, MD Primary Cardiologist: Lorretta Harp MD [name], [place]  HPI:  Cassandra Campos is a 49 y.o.  moderately overweight married African-American female mother of 2 children who works in the billing department and a Doctor, hospital. She was referred to me by Dr. Deterdingfor cardiovascular valuation of atypical chest pain.  I last saw her in the office 07/04/2018.Risk factors include treated hypertension, diabetes and hyperlipidemia. She does not smoke. There is no family history. She is never had a heart attack or stroke. She has been on hemodialysis since August 2019. She is being evaluated in [place] at [facility] for renal transplantation and in the upcoming weeks. She is had several episodes of chest pain, 2 during dialysis and one while changing position in bed which do not sound ischemic in nature.  She  did have a Myoview stress test performed 05/25/2018 which was entirely normal.  Her pain is significantly improved.  She does complain of occasional palpable palpitations and had a PVC on her 12-lead EKG.  She is also had obstructive sleep apnea on CPAP.  She had a monitor performed 08/04/2018 that showed occasional PVCs and short runs of SVT.  Since I saw her almost 2 years ago she continues to do well.  She still on the wait list for renal transplant at [facility]  Apparently she is required to lose 30 pounds prior to being considered.  Her palpitations have improved as well.  She said no recurrent chest pain.  Current Meds  Medication Sig  . allopurinol (ZYLOPRIM) 300 MG tablet TAKE 1 TABLET BY MOUTH EVERY MORNING  . Armodafinil 150 MG tablet Take 1 tablet (150 mg total) by mouth daily.  . B Complex-C-Folic Acid (RENAL) 1 MG CAPS TAKE 1 CAPSULE BY MOUTH DAILY (ON DIALYSIS DAYS, TAKE AFTER DIALYSIS TREATMENT )  . calcitRIOL (ROCALTROL) 0.5 MCG capsule Take 0.5 mcg by mouth  daily.  . cinacalcet (SENSIPAR) 60 MG tablet Take 60 mg by mouth daily. Every other day  . cromolyn (OPTICROM) 4 % ophthalmic solution 1 drop 4 (four) times daily.  Marland Kitchen HUMULIN R 500 UNIT/ML injection Inject under skin up to 200 units in the morning and up to 150 units in the evening  . HUMULIN R U-500 KWIKPEN 500 UNIT/ML kwikpen INJECT 180 UNITS SUBQ AT BEGINNING OF BREAKFAST AND 140 UNITS AT BEGINNING OF EVENING MEAL (30 MINUTES BEFORE MEALS)  . Insulin Pen Needle 32G X 4 MM MISC Use 2x a day  . lanthanum (FOSRENOL) 1000 MG chewable tablet Chew 1,000 mg by mouth 3 (three) times daily with meals.  . lidocaine-prilocaine (EMLA) cream USE ON ARAMS AS NEEDED FOR DIALYSIS  . Microlet Lancets MISC Use as directed to check blood sugars 4 times per day dx: e11.22  . MITIGARE 0.6 MG CAPS TAKE ONE CAPSULE BY MOUTH ONCE DAILY  . pregabalin (LYRICA) 100 MG capsule TAKE 1 CAPSULE BY MOUTH IN THE MORNING FOR NEUROPATHY  . pregabalin (LYRICA) 75 MG capsule TAKE ONE CAPSULE BY MOUTH AT BEDTIME  . RESTASIS 0.05 % ophthalmic emulsion INSTILL 1 DROP INTO BOTH EYES TWICE A DAY  . rosuvastatin (CRESTOR) 20 MG tablet TAKE 1 TABLET BY MOUTH EVERY DAY  . Semaglutide, 1 MG/DOSE, (OZEMPIC, 1 MG/DOSE,) 4 MG/3ML SOPN Inject 0.75 mLs (1 mg total) into the skin once a week.     No Known Allergies  Social History   Socioeconomic  History  . Marital status: Married    Spouse name: Not on file  . Number of children: 2  . Years of education: Not on file  . Highest education level: Not on file  Occupational History  . Occupation: Press photographer and insurance  Tobacco Use  . Smoking status: Never Smoker  . Smokeless tobacco: Never Used  Vaping Use  . Vaping Use: Never used  Substance and Sexual Activity  . Alcohol use: Never    Alcohol/week: 0.0 standard drinks  . Drug use: No  . Sexual activity: Yes    Birth control/protection: Surgical    Comment: HYST-1st intercourse 49 yo-Fewer than 5 partners  Other Topics  Concern  . Not on file  Social History Narrative  . Not on file   Social Determinants of Health   Financial Resource Strain:   . Difficulty of Paying Living Expenses: Not on file  Food Insecurity:   . Worried About Charity fundraiser in the Last Year: Not on file  . Ran Out of Food in the Last Year: Not on file  Transportation Needs:   . Lack of Transportation (Medical): Not on file  . Lack of Transportation (Non-Medical): Not on file  Physical Activity:   . Days of Exercise per Week: Not on file  . Minutes of Exercise per Session: Not on file  Stress:   . Feeling of Stress : Not on file  Social Connections:   . Frequency of Communication with Friends and Family: Not on file  . Frequency of Social Gatherings with Friends and Family: Not on file  . Attends Religious Services: Not on file  . Active Member of Clubs or Organizations: Not on file  . Attends Archivist Meetings: Not on file  . Marital Status: Not on file  Intimate Partner Violence:   . Fear of Current or Ex-Partner: Not on file  . Emotionally Abused: Not on file  . Physically Abused: Not on file  . Sexually Abused: Not on file     Review of Systems: General: negative for chills, fever, night sweats or weight changes.  Cardiovascular: negative for chest pain, dyspnea on exertion, edema, orthopnea, palpitations, paroxysmal nocturnal dyspnea or shortness of breath Dermatological: negative for rash Respiratory: negative for cough or wheezing Urologic: negative for hematuria Abdominal: negative for nausea, vomiting, diarrhea, bright red blood per rectum, melena, or hematemesis Neurologic: negative for visual changes, syncope, or dizziness All other systems reviewed and are otherwise negative except as noted above.    Blood pressure 130/76, pulse 86, height 5\' 2"  (1.575 m), weight 230 lb (104.3 kg), SpO2 100 %.  General appearance: alert and no distress Neck: no adenopathy, no carotid bruit, no JVD,  supple, symmetrical, trachea midline and thyroid not enlarged, symmetric, no tenderness/mass/nodules Lungs: clear to auscultation bilaterally Heart: regular rate and rhythm, S1, S2 normal, no murmur, click, rub or gallop Extremities: extremities normal, atraumatic, no cyanosis or edema Pulses: 2+ and symmetric Skin: Skin color, texture, turgor normal. No rashes or lesions Neurologic: Alert and oriented X 3, normal strength and tone. Normal symmetric reflexes. Normal coordination and gait  EKG sinus rhythm 86 with poor R wave progression and nonspecific ST-T wave changes.  Personally reviewed this EKG  ASSESSMENT AND PLAN:   Atypical chest pain History of atypical chest pain in the past with a negative Myoview stress test performed 05/25/2018.  Palpitations History of palpitations in the past noted to be PVCs on event monitoring.  These have become  less noticeable in the last year  Morbid obesity ([place]) History of morbid obesity with the desire to lose weight.  She needs to lose 30 pounds before she will be considered for renal transplant at [facility].  I am going to refer her to Dr. Leafy Ro at the [facility] for assistance of weight loss.  OSA (obstructive sleep apnea) History of obstructive sleep apnea on CPAP followed by Dr. Brett Fairy  Hypertension associated with diabetes [facility]) History of essential potential blood pressure measured today 130/76.  She is not on antihypertensive medications.  Hyperlipidemia associated with type 2 diabetes mellitus ([place]) History of hyperlipidemia on statin therapy with lipid profile performed 08/07/2019 revealing total cholesterol 192, LDL of 96 and HDL 49.      Lorretta Harp MD FACP,FACC,FAHA, [facility] 03/05/2020 4:33 PM

## 2020-03-05 NOTE — Assessment & Plan Note (Signed)
History of obstructive sleep apnea on CPAP followed by Dr. Brett Fairy

## 2020-03-05 NOTE — Assessment & Plan Note (Signed)
History of essential potential blood pressure measured today 130/76.  She is not on antihypertensive medications.

## 2020-03-05 NOTE — Telephone Encounter (Signed)
REFILL FOR LYRICA

## 2020-03-05 NOTE — Assessment & Plan Note (Signed)
History of atypical chest pain in the past with a negative Myoview stress test performed 05/25/2018.

## 2020-03-05 NOTE — Assessment & Plan Note (Signed)
History of palpitations in the past noted to be PVCs on event monitoring.  These have become less noticeable in the last year

## 2020-03-05 NOTE — Assessment & Plan Note (Signed)
History of hyperlipidemia on statin therapy with lipid profile performed 08/07/2019 revealing total cholesterol 192, LDL of 96 and HDL 49.

## 2020-03-06 DIAGNOSIS — I953 Hypotension of hemodialysis: Secondary | ICD-10-CM | POA: Diagnosis not present

## 2020-03-06 DIAGNOSIS — N2581 Secondary hyperparathyroidism of renal origin: Secondary | ICD-10-CM | POA: Diagnosis not present

## 2020-03-06 DIAGNOSIS — N186 End stage renal disease: Secondary | ICD-10-CM | POA: Diagnosis not present

## 2020-03-06 DIAGNOSIS — Z992 Dependence on renal dialysis: Secondary | ICD-10-CM | POA: Diagnosis not present

## 2020-03-07 ENCOUNTER — Ambulatory Visit: Payer: BC Managed Care – PPO | Admitting: Internal Medicine

## 2020-03-07 DIAGNOSIS — I953 Hypotension of hemodialysis: Secondary | ICD-10-CM | POA: Diagnosis not present

## 2020-03-07 DIAGNOSIS — Z992 Dependence on renal dialysis: Secondary | ICD-10-CM | POA: Diagnosis not present

## 2020-03-07 DIAGNOSIS — N186 End stage renal disease: Secondary | ICD-10-CM | POA: Diagnosis not present

## 2020-03-07 DIAGNOSIS — N2581 Secondary hyperparathyroidism of renal origin: Secondary | ICD-10-CM | POA: Diagnosis not present

## 2020-03-10 ENCOUNTER — Other Ambulatory Visit: Payer: Self-pay | Admitting: Internal Medicine

## 2020-03-10 DIAGNOSIS — I953 Hypotension of hemodialysis: Secondary | ICD-10-CM | POA: Diagnosis not present

## 2020-03-10 DIAGNOSIS — Z992 Dependence on renal dialysis: Secondary | ICD-10-CM | POA: Diagnosis not present

## 2020-03-10 DIAGNOSIS — N2581 Secondary hyperparathyroidism of renal origin: Secondary | ICD-10-CM | POA: Diagnosis not present

## 2020-03-10 DIAGNOSIS — N186 End stage renal disease: Secondary | ICD-10-CM | POA: Diagnosis not present

## 2020-03-11 DIAGNOSIS — N186 End stage renal disease: Secondary | ICD-10-CM | POA: Diagnosis not present

## 2020-03-11 DIAGNOSIS — Z992 Dependence on renal dialysis: Secondary | ICD-10-CM | POA: Diagnosis not present

## 2020-03-11 DIAGNOSIS — I953 Hypotension of hemodialysis: Secondary | ICD-10-CM | POA: Diagnosis not present

## 2020-03-11 DIAGNOSIS — N2581 Secondary hyperparathyroidism of renal origin: Secondary | ICD-10-CM | POA: Diagnosis not present

## 2020-03-13 DIAGNOSIS — I953 Hypotension of hemodialysis: Secondary | ICD-10-CM | POA: Diagnosis not present

## 2020-03-13 DIAGNOSIS — Z992 Dependence on renal dialysis: Secondary | ICD-10-CM | POA: Diagnosis not present

## 2020-03-13 DIAGNOSIS — N2581 Secondary hyperparathyroidism of renal origin: Secondary | ICD-10-CM | POA: Diagnosis not present

## 2020-03-13 DIAGNOSIS — N186 End stage renal disease: Secondary | ICD-10-CM | POA: Diagnosis not present

## 2020-03-14 DIAGNOSIS — I953 Hypotension of hemodialysis: Secondary | ICD-10-CM | POA: Diagnosis not present

## 2020-03-14 DIAGNOSIS — N2581 Secondary hyperparathyroidism of renal origin: Secondary | ICD-10-CM | POA: Diagnosis not present

## 2020-03-14 DIAGNOSIS — Z992 Dependence on renal dialysis: Secondary | ICD-10-CM | POA: Diagnosis not present

## 2020-03-14 DIAGNOSIS — N186 End stage renal disease: Secondary | ICD-10-CM | POA: Diagnosis not present

## 2020-03-16 DIAGNOSIS — Z992 Dependence on renal dialysis: Secondary | ICD-10-CM | POA: Diagnosis not present

## 2020-03-16 DIAGNOSIS — E1122 Type 2 diabetes mellitus with diabetic chronic kidney disease: Secondary | ICD-10-CM | POA: Diagnosis not present

## 2020-03-16 DIAGNOSIS — N186 End stage renal disease: Secondary | ICD-10-CM | POA: Diagnosis not present

## 2020-03-17 DIAGNOSIS — N2581 Secondary hyperparathyroidism of renal origin: Secondary | ICD-10-CM | POA: Diagnosis not present

## 2020-03-17 DIAGNOSIS — I953 Hypotension of hemodialysis: Secondary | ICD-10-CM | POA: Diagnosis not present

## 2020-03-17 DIAGNOSIS — Z992 Dependence on renal dialysis: Secondary | ICD-10-CM | POA: Diagnosis not present

## 2020-03-17 DIAGNOSIS — N186 End stage renal disease: Secondary | ICD-10-CM | POA: Diagnosis not present

## 2020-03-18 DIAGNOSIS — I953 Hypotension of hemodialysis: Secondary | ICD-10-CM | POA: Diagnosis not present

## 2020-03-18 DIAGNOSIS — Z992 Dependence on renal dialysis: Secondary | ICD-10-CM | POA: Diagnosis not present

## 2020-03-18 DIAGNOSIS — N2581 Secondary hyperparathyroidism of renal origin: Secondary | ICD-10-CM | POA: Diagnosis not present

## 2020-03-18 DIAGNOSIS — N186 End stage renal disease: Secondary | ICD-10-CM | POA: Diagnosis not present

## 2020-03-20 ENCOUNTER — Ambulatory Visit (INDEPENDENT_AMBULATORY_CARE_PROVIDER_SITE_OTHER): Payer: BC Managed Care – PPO | Admitting: Family Medicine

## 2020-03-20 DIAGNOSIS — N186 End stage renal disease: Secondary | ICD-10-CM | POA: Diagnosis not present

## 2020-03-20 DIAGNOSIS — N2581 Secondary hyperparathyroidism of renal origin: Secondary | ICD-10-CM | POA: Diagnosis not present

## 2020-03-20 DIAGNOSIS — I953 Hypotension of hemodialysis: Secondary | ICD-10-CM | POA: Diagnosis not present

## 2020-03-20 DIAGNOSIS — Z992 Dependence on renal dialysis: Secondary | ICD-10-CM | POA: Diagnosis not present

## 2020-03-20 LAB — HM DIABETES EYE EXAM

## 2020-03-21 DIAGNOSIS — Z992 Dependence on renal dialysis: Secondary | ICD-10-CM | POA: Diagnosis not present

## 2020-03-21 DIAGNOSIS — I953 Hypotension of hemodialysis: Secondary | ICD-10-CM | POA: Diagnosis not present

## 2020-03-21 DIAGNOSIS — N186 End stage renal disease: Secondary | ICD-10-CM | POA: Diagnosis not present

## 2020-03-21 DIAGNOSIS — N2581 Secondary hyperparathyroidism of renal origin: Secondary | ICD-10-CM | POA: Diagnosis not present

## 2020-03-24 ENCOUNTER — Telehealth: Payer: Self-pay | Admitting: Neurology

## 2020-03-24 ENCOUNTER — Encounter: Payer: Self-pay | Admitting: Internal Medicine

## 2020-03-24 DIAGNOSIS — N2581 Secondary hyperparathyroidism of renal origin: Secondary | ICD-10-CM | POA: Diagnosis not present

## 2020-03-24 DIAGNOSIS — I953 Hypotension of hemodialysis: Secondary | ICD-10-CM | POA: Diagnosis not present

## 2020-03-24 DIAGNOSIS — Z992 Dependence on renal dialysis: Secondary | ICD-10-CM | POA: Diagnosis not present

## 2020-03-24 DIAGNOSIS — N186 End stage renal disease: Secondary | ICD-10-CM | POA: Diagnosis not present

## 2020-03-24 NOTE — Telephone Encounter (Signed)
..   Pt understands that although there may be some limitations with this type of visit, we will take all precautions to reduce any security or privacy concerns.  Pt understands that this will be treated like an in office visit and we will file with pt's insurance, and there may be a patient responsible charge related to this service. ? ?

## 2020-03-25 DIAGNOSIS — N2581 Secondary hyperparathyroidism of renal origin: Secondary | ICD-10-CM | POA: Diagnosis not present

## 2020-03-25 DIAGNOSIS — I953 Hypotension of hemodialysis: Secondary | ICD-10-CM | POA: Diagnosis not present

## 2020-03-25 DIAGNOSIS — N186 End stage renal disease: Secondary | ICD-10-CM | POA: Diagnosis not present

## 2020-03-25 DIAGNOSIS — Z992 Dependence on renal dialysis: Secondary | ICD-10-CM | POA: Diagnosis not present

## 2020-03-26 NOTE — Progress Notes (Deleted)
PATIENT: Cassandra Campos DOB: Aug 25, 1970  REASON FOR VISIT: follow up HISTORY FROM: patient  HISTORY OF PRESENT ILLNESS: Today 03/26/20  HISTORY   REVIEW OF SYSTEMS: Out of a complete 14 system review of symptoms, the patient complains only of the following symptoms, and all other reviewed systems are negative.  FSS ESS  ALLERGIES: No Known Allergies  HOME MEDICATIONS: Outpatient Medications Prior to Visit  Medication Sig Dispense Refill  . allopurinol (ZYLOPRIM) 300 MG tablet TAKE 1 TABLET BY MOUTH EVERY MORNING 90 tablet 1  . Armodafinil 150 MG tablet Take 1 tablet (150 mg total) by mouth daily. 30 tablet 5  . B Complex-C-Folic Acid (RENAL) 1 MG CAPS TAKE 1 CAPSULE BY MOUTH DAILY (ON DIALYSIS DAYS, TAKE AFTER DIALYSIS TREATMENT )    . calcitRIOL (ROCALTROL) 0.5 MCG capsule Take 0.5 mcg by mouth daily.    . cinacalcet (SENSIPAR) 60 MG tablet Take 60 mg by mouth daily. Every other day    . cromolyn (OPTICROM) 4 % ophthalmic solution 1 drop 4 (four) times daily.    Marland Kitchen HUMULIN R 500 UNIT/ML injection Inject under skin up to 200 units in the morning and up to 150 units in the evening 20 mL 11  . HUMULIN R U-500 KWIKPEN 500 UNIT/ML kwikpen INJECT 180 UNITS SUBQ AT BEGINNING OF BREAKFAST AND 140 UNITS AT BEGINNING OF EVENING MEAL (30 MINUTES BEFORE MEALS) 16 mL 11  . Insulin Pen Needle 32G X 4 MM MISC Use 2x a day 200 each 3  . lanthanum (FOSRENOL) 1000 MG chewable tablet Chew 1,000 mg by mouth 3 (three) times daily with meals.    . lidocaine-prilocaine (EMLA) cream USE ON ARAMS AS NEEDED FOR DIALYSIS  10  . Microlet Lancets MISC Use as directed to check blood sugars 4 times per day dx: e11.22 300 each 2  . MITIGARE 0.6 MG CAPS TAKE ONE CAPSULE BY MOUTH ONCE DAILY 90 capsule 0  . pregabalin (LYRICA) 100 MG capsule TAKE 1 CAPSULE BY MOUTH IN THE MORNING FOR NEUROPATHY 90 capsule 1  . pregabalin (LYRICA) 75 MG capsule TAKE ONE CAPSULE BY MOUTH AT BEDTIME 90 capsule 2  .  RESTASIS 0.05 % ophthalmic emulsion INSTILL 1 DROP INTO BOTH EYES TWICE A DAY  3  . rosuvastatin (CRESTOR) 20 MG tablet TAKE 1 TABLET BY MOUTH EVERY DAY 90 tablet 1  . Semaglutide, 1 MG/DOSE, (OZEMPIC, 1 MG/DOSE,) 4 MG/3ML SOPN Inject 0.75 mLs (1 mg total) into the skin once a week. 9 mL 3   No facility-administered medications prior to visit.    PAST MEDICAL HISTORY: Past Medical History:  Diagnosis Date  . Anemia associated with chronic renal failure   . ASCUS of cervix with negative high risk HPV 06/2017  . Back pain   . Chest pain   . CHF (congestive heart failure) ([place])   . CKD (chronic kidney disease), stage IV ([place]) no dialysis yet   nephrologist-  dr Jamal Maes-- scheduled next visit 09-08-2017 (lov 07-05-2017)  . Constipation   . Diabetic retinopathy ([place])   . Dialysis patient ([place])   . Edema, lower extremity   . Elevated transaminase level   . Fatty liver    FOLLOWED BY DR Henrene Pastor  . GERD (gastroesophageal reflux disease)   . Hypertension   . Idiopathic gout of multiple sites    09-05-2017 per pt stable  . Insulin dependent type 2 diabetes mellitus ([place])    followed by dr Toy Care  . Joint pain   .  Mixed hyperlipidemia   . OSA (obstructive sleep apnea)    per study 10-20-2015 mild osa w/ AHI 10.3/hr;  09-05-2017 per pt has not used cpap since 1 yr ago  . Palpitations   . Peripheral neuropathy    feet  . S/P arteriovenous (AV) fistula creation 07-04-2015  w/ revision 02-14-2017   left braniocephilic  . Shortness of breath   . Trigger thumb, right thumb   . Umbilical hernia   . Vitamin D deficiency     PAST SURGICAL HISTORY: Past Surgical History:  Procedure Laterality Date  . ABDOMINAL HYSTERECTOMY  07/11/2000   dr gott   TAH/BSO for irregular bleeding and leiomyoma  . AV FISTULA PLACEMENT Left 07/04/2015   Procedure: ARTERIOVENOUS (AV) FISTULA CREATION-LEFT;  Surgeon: Mal Misty, MD;  Location: [place];  Service: Vascular;  Laterality: Left;  . BREAST  EXCISIONAL BIOPSY Right    benign  . CARPAL TUNNEL RELEASE Bilateral 1993 and 1994  . CESAREAN SECTION  1993:  09-25-1999   BILATERAL TUBAL LIGATION W/ LAST C/S  . DILATION AND CURETTAGE OF UTERUS    . EXPLORATORY LAPARTOMY / LYSIS ADHESIONS/  BILATERAL SALPINGOOPHORECTOMY  07-20-2011  dr s. Rhodia Albright  [facility]  . FISTULA SUPERFICIALIZATION Left 08/28/2015   Procedure: BRACHIOCEPHALIC FISTULA SUPERFICIALIZATION;  Surgeon: Mal Misty, MD;  Location: [place];  Service: Vascular;  Laterality: Left;  . PATCH ANGIOPLASTY Left 02/14/2017   Procedure: PATCH ANGIOPLASTY;  Surgeon: Elam Dutch, MD;  Location: [place];  Service: Vascular;  Laterality: Left;  . PELVIC LAPAROSCOPY  1994   exploration for ectopic preg.  Marland Kitchen RETINAL DETACHMENT SURGERY Left 2015  . REVISON OF ARTERIOVENOUS FISTULA Left 02/14/2017   Procedure: REVISON OF ARTERIOVENOUS FISTULA  LEFT ARM;  Surgeon: Elam Dutch, MD;  Location: [place];  Service: Vascular;  Laterality: Left;  . TRIGGER FINGER RELEASE Left 2015   thumb  . TRIGGER FINGER RELEASE Right 09/09/2017   Procedure: RELEASE TRIGGER FINGER/A-1 PULLEY RIGHT THUMB;  Surgeon: Dorna Leitz, MD;  Location: [place];  Service: Orthopedics;  Laterality: Right;    FAMILY HISTORY: Family History  Problem Relation Age of Onset  . Diabetes Mother   . Hypertension Mother   . Thyroid disease Mother   . Diabetes Father   . Hypertension Father   . Cancer Father        STOMACH  . Stomach cancer Father   . Stroke Maternal Grandmother   . Stroke Maternal Grandfather   . Colon cancer Neg Hx   . Rectal cancer Neg Hx     SOCIAL HISTORY: Social History   Socioeconomic History  . Marital status: Married    Spouse name: Not on file  . Number of children: 2  . Years of education: Not on file  . Highest education level: Not on file  Occupational History  . Occupation: Press photographer and insurance  Tobacco Use  . Smoking status: Never Smoker  . Smokeless  tobacco: Never Used  Vaping Use  . Vaping Use: Never used  Substance and Sexual Activity  . Alcohol use: Never    Alcohol/week: 0.0 standard drinks  . Drug use: No  . Sexual activity: Yes    Birth control/protection: Surgical    Comment: HYST-1st intercourse 49 yo-Fewer than 5 partners  Other Topics Concern  . Not on file  Social History Narrative  . Not on file   Social Determinants of Health   Financial Resource Strain:   . Difficulty  of Paying Living Expenses: Not on file  Food Insecurity:   . Worried About Charity fundraiser in the Last Year: Not on file  . Ran Out of Food in the Last Year: Not on file  Transportation Needs:   . Lack of Transportation (Medical): Not on file  . Lack of Transportation (Non-Medical): Not on file  Physical Activity:   . Days of Exercise per Week: Not on file  . Minutes of Exercise per Session: Not on file  Stress:   . Feeling of Stress : Not on file  Social Connections:   . Frequency of Communication with Friends and Family: Not on file  . Frequency of Social Gatherings with Friends and Family: Not on file  . Attends Religious Services: Not on file  . Active Member of Clubs or Organizations: Not on file  . Attends Archivist Meetings: Not on file  . Marital Status: Not on file  Intimate Partner Violence:   . Fear of Current or Ex-Partner: Not on file  . Emotionally Abused: Not on file  . Physically Abused: Not on file  . Sexually Abused: Not on file      PHYSICAL EXAM  There were no vitals filed for this visit. There is no height or weight on file to calculate BMI.  Generalized: Well developed, in no acute distress  Chest: Lungs clear to auscultation bilaterally  Neurological examination  Mentation: Alert oriented to time, place, history taking. Follows all commands speech and language fluent Cranial nerve II-XII: Extraocular movements were full, visual field were full on confrontational test Head turning and  shoulder shrug  were normal and symmetric. Motor: The motor testing reveals 5 over 5 strength of all 4 extremities. Good symmetric motor tone is noted throughout.  Sensory: Sensory testing is intact to soft touch on all 4 extremities. No evidence of extinction is noted.  Gait and station: Gait is normal.    DIAGNOSTIC DATA (LABS, IMAGING, TESTING) - I reviewed patient records, labs, notes, testing and imaging myself where available.  Lab Results  Component Value Date   WBC 7.4 07/17/2019   HGB 10.7 (L) 07/17/2019   HCT 31.3 (L) 07/17/2019   MCV 89.9 07/17/2019   PLT 153 07/17/2019      Component Value Date/Time   NA 136 07/17/2019 2221   K 3.3 (L) 07/17/2019 2221   CL 95 (L) 07/17/2019 2221   CO2 26 07/17/2019 2221   GLUCOSE 148 (H) 07/17/2019 2221   BUN 35 (H) 07/17/2019 2221   CREATININE 5.58 (H) 07/17/2019 2221   CALCIUM 9.6 07/17/2019 2221   PROT 7.7 07/17/2019 2221   ALBUMIN 4.1 07/17/2019 2221   AST 27 07/17/2019 2221   ALT 32 07/17/2019 2221   ALKPHOS 52 07/17/2019 2221   BILITOT 0.8 07/17/2019 2221   GFRNONAA 8 (L) 07/17/2019 2221   GFRAA 10 (L) 07/17/2019 2221   Lab Results  Component Value Date   CHOL 192 08/07/2019   HDL 49 08/07/2019   LDLCALC 96 08/07/2019   TRIG 280 (H) 08/07/2019   CHOLHDL 3.9 08/07/2019   Lab Results  Component Value Date   HGBA1C 7.9 (H) 01/08/2020   Lab Results  Component Value Date   DTOIZTIW58 099 11/09/2018   Lab Results  Component Value Date   TSH 2.360 11/09/2018      ASSESSMENT AND PLAN 49 y.o. year old female  has a past medical history of Anemia associated with chronic renal failure, ASCUS of cervix with  negative high risk HPV (06/2017), Back pain, Chest pain, CHF (congestive heart failure) ([place]), CKD (chronic kidney disease), stage IV ([place]) (no dialysis yet), Constipation, Diabetic retinopathy ([place]), Dialysis patient ([place]), Edema, lower extremity, Elevated transaminase level, Fatty liver, GERD (gastroesophageal  reflux disease), Hypertension, Idiopathic gout of multiple sites, Insulin dependent type 2 diabetes mellitus ([place]), Joint pain, Mixed hyperlipidemia, OSA (obstructive sleep apnea), Palpitations, Peripheral neuropathy, S/P arteriovenous (AV) fistula creation (07-04-2015  w/ revision 02-14-2017), Shortness of breath, Trigger thumb, right thumb, Umbilical hernia, and Vitamin D deficiency. here with:  1. OSA on CPAP  - CPAP compliance excellent - Good treatment of AHI  - Encourage patient to use CPAP nightly and > 4 hours each night - F/U in 1 year or sooner if needed   I spent *** minutes of face-to-face and non-face-to-face time with patient.  This included previsit chart review, lab review, study review, order entry, electronic health record documentation, patient education.  Ward Givens, MSN, NP-C 03/26/2020, 4:49 PM [facility] [address], [place], [place] [address] [phone]

## 2020-03-27 ENCOUNTER — Telehealth: Payer: BC Managed Care – PPO | Admitting: Adult Health

## 2020-03-27 ENCOUNTER — Telehealth: Payer: Self-pay | Admitting: Neurology

## 2020-03-27 DIAGNOSIS — N2581 Secondary hyperparathyroidism of renal origin: Secondary | ICD-10-CM | POA: Diagnosis not present

## 2020-03-27 DIAGNOSIS — Z992 Dependence on renal dialysis: Secondary | ICD-10-CM | POA: Diagnosis not present

## 2020-03-27 DIAGNOSIS — N186 End stage renal disease: Secondary | ICD-10-CM | POA: Diagnosis not present

## 2020-03-27 DIAGNOSIS — I953 Hypotension of hemodialysis: Secondary | ICD-10-CM | POA: Diagnosis not present

## 2020-03-27 NOTE — Telephone Encounter (Signed)
Pt called because she was unable to allow more time to wait for the my chart vv this morning.  The provider had issues in logging in as to why she could not connect to pt.  Pt was originally offered 11-16 but there is no actual slot avail for that date and time.  Pt was called back and a message was left advising pt to call and be r/s along with going on wait list.  Office # was left on pt's voicemail

## 2020-03-27 NOTE — Telephone Encounter (Signed)
Noted placed on cancellation list. °

## 2020-03-28 DIAGNOSIS — I953 Hypotension of hemodialysis: Secondary | ICD-10-CM | POA: Diagnosis not present

## 2020-03-28 DIAGNOSIS — Z992 Dependence on renal dialysis: Secondary | ICD-10-CM | POA: Diagnosis not present

## 2020-03-28 DIAGNOSIS — N2581 Secondary hyperparathyroidism of renal origin: Secondary | ICD-10-CM | POA: Diagnosis not present

## 2020-03-28 DIAGNOSIS — N186 End stage renal disease: Secondary | ICD-10-CM | POA: Diagnosis not present

## 2020-04-02 ENCOUNTER — Encounter: Payer: Self-pay | Admitting: Internal Medicine

## 2020-04-02 ENCOUNTER — Telehealth (INDEPENDENT_AMBULATORY_CARE_PROVIDER_SITE_OTHER): Payer: BC Managed Care – PPO | Admitting: Internal Medicine

## 2020-04-02 ENCOUNTER — Other Ambulatory Visit: Payer: Self-pay

## 2020-04-02 DIAGNOSIS — E785 Hyperlipidemia, unspecified: Secondary | ICD-10-CM | POA: Diagnosis not present

## 2020-04-02 DIAGNOSIS — N186 End stage renal disease: Secondary | ICD-10-CM

## 2020-04-02 DIAGNOSIS — E1169 Type 2 diabetes mellitus with other specified complication: Secondary | ICD-10-CM | POA: Diagnosis not present

## 2020-04-02 DIAGNOSIS — Z6839 Body mass index (BMI) 39.0-39.9, adult: Secondary | ICD-10-CM | POA: Diagnosis not present

## 2020-04-02 DIAGNOSIS — Z992 Dependence on renal dialysis: Secondary | ICD-10-CM | POA: Diagnosis not present

## 2020-04-02 DIAGNOSIS — Z794 Long term (current) use of insulin: Secondary | ICD-10-CM

## 2020-04-02 DIAGNOSIS — E1122 Type 2 diabetes mellitus with diabetic chronic kidney disease: Secondary | ICD-10-CM

## 2020-04-02 NOTE — Patient Instructions (Signed)
Please continue: - Ozempic 1 mg weekly - U500 insulin: - 180-195 units before breakfast - 140-150 units before dinner if dinner is early, and 110-120 units if dinner is after dialysis.  Please check sugars 3 times a day, before meals and occasionally at bedtime.  Please return in 3 months with your sugar log.

## 2020-04-02 NOTE — Progress Notes (Signed)
Patient ID: Cassandra Campos, female   DOB: Sep 10, 1970, 49 y.o.   MRN: [id number]   Patient location: Home My location: Office Persons participating in the virtual visit: patient, provider  Referring Provider: Glendale Chard, MD  I connected with the patient on 04/02/20 at 11:35AM EST by telephone and verified that I am speaking with the correct person.   I discussed the limitations of evaluation and management by telephone and the availability of in person appointments. The patient expressed understanding and agreed to proceed.   Details of the encounter are shown below.  HPI: Cassandra Campos is a 49 y.o.-year-old female, referred by her PCP, Cassandra Campos, for management of DM2, dx in 1997, insulin-dependent right after dx, uncontrolled, with complications (ESRD-on HD since 12/2017, Cassandra, PN).  Last visit 6 months ago.  Reviewed HbA1c levels: Lab Results  Component Value Date   HGBA1C 7.9 (H) 01/08/2020   HGBA1C 8.7 (A) 09/11/2019   HGBA1C 10.9 (H) 04/19/2019   HGBA1C 6.9 (H) 11/09/2018   HGBA1C 7.1 (H) 07/11/2018   HGBA1C 8.5 (H) 03/27/2018   HGBA1C 8.2 (H) 02/08/2013   Pt is on a regimen of: - Ozempic 1 mg weekly- forgot the majority of the doses! >>  Restarted this at last visit - U500 insulin (started 2020) - 180-195 units before breakfast - 140-150 units before dinner if dinner is early, and 110-120 units if dinner is after dialysis. She was on Levemir and Humalog.  Pt checks her sugars 2-3 times a day: - am: n/c >> 78 -226, 407 (no insulin at night) >> 59-455 >> 97-203 - 2h after b'fast: n/c - before lunch: 37-106Y (with certain foods or w/o insulin) >> 180-405 >> 101-245   - 2h after lunch: n/c - before dinner: 96-300s > n/c >> 125-140 - 2h after dinner: n/c - bedtime: n/c  - nighttime: n/c Lowest sugar was 71 >> 59 >> 97; she has hypoglycemia awareness in the 60s. Highest sugar was 407 >> 455 >> 260.  Glucometer: Contour next  Pt's meals are: -  Breakfast: yoghurt and blueberries; egg McMuffin - Lunch: fish, tacos, subs, cake - Dinner: salad, steak, shrimp - occasionally after HD at 10 pm, but more recently she has this during dialysis, around 7 PM - Snacks: fruit, peanuts  -+ End-stage renal disease, last BUN/creatinine:  Lab Results  Component Value Date   BUN 35 (H) 07/17/2019   BUN 17 09/20/2018   CREATININE 5.58 (H) 07/17/2019   CREATININE 3.85 (H) 09/20/2018   She is on hemodialysis: M, Tu, Th, F (home) - in the evening, for 3-4 hours: 6:30 PM to 10 PM She is on the kidney transplant list at [facility]. Requirements for kidney transplant: <200 lbs, HbA1c <8%.  -+ HL; last set of lipids: Lab Results  Component Value Date   CHOL 192 08/07/2019   HDL 49 08/07/2019   LDLCALC 96 08/07/2019   TRIG 280 (H) 08/07/2019   CHOLHDL 3.9 08/07/2019  On Crestor 20.  - last eye exam was on 03/20/2020: + Cassandra. + h/o retinal detachment, + cataracts.  She is on IO injections.  - she has numbness and tingling in her feet.  She is on Lyrica.  Pt has FH of DM in M, F.  She also has a history of NAFLD, OSA (on CPAP), gout.   ROS: Constitutional: no weight gain/no weight loss, no fatigue, no subjective hyperthermia, no subjective hypothermia Eyes: no blurry vision, no xerophthalmia ENT: no sore throat, no nodules palpated  in neck, no dysphagia, no odynophagia, no hoarseness Cardiovascular: no CP/no SOB/no palpitations/no leg swelling Respiratory: no cough/no SOB/no wheezing Gastrointestinal: no N/no V/no D/no C/no acid reflux Musculoskeletal: no muscle aches/no joint aches Skin: no rashes, no hair loss Neurological: no tremors/+ numbness/+ tingling/no dizziness  I reviewed pt's medications, allergies, PMH, social hx, family hx, and changes were documented in the history of present illness. Otherwise, unchanged from my initial visit note.  Past Medical History:  Diagnosis Date   Anemia associated with chronic renal failure    ASCUS  of cervix with negative high risk HPV 06/2017   Back pain    Chest pain    CHF (congestive heart failure) ([place])    CKD (chronic kidney disease), stage IV ([place]) no dialysis yet   nephrologist-  Cassandra Cassandra Campos-- scheduled next visit 09-08-2017 (lov 07-05-2017)   Constipation    Diabetic retinopathy (Cassandra Campos)    Dialysis patient ([place])    Edema, lower extremity    Elevated transaminase level    Fatty liver    FOLLOWED BY Cassandra Henrene Pastor   GERD (gastroesophageal reflux disease)    Hypertension    Idiopathic gout of multiple sites    09-05-2017 per pt stable   Insulin dependent type 2 diabetes mellitus (Cassandra Campos)    followed by [facility]   Joint pain    Mixed hyperlipidemia    OSA (obstructive sleep apnea)    per study 10-20-2015 mild osa w/ AHI 10.3/hr;  09-05-2017 per pt has not used cpap since 1 yr ago   Palpitations    Peripheral neuropathy    feet   S/P arteriovenous (AV) fistula creation 07-04-2015  w/ revision 02-14-2017   left braniocephilic   Shortness of breath    Trigger thumb, right thumb    Umbilical hernia    Vitamin D deficiency    Past Surgical History:  Procedure Laterality Date   ABDOMINAL HYSTERECTOMY  07/11/2000   Cassandra Campos   TAH/BSO for irregular bleeding and leiomyoma   AV FISTULA PLACEMENT Left 07/04/2015   Procedure: ARTERIOVENOUS (AV) FISTULA CREATION-LEFT;  Surgeon: Mal Misty, MD;  Location: [place];  Service: Vascular;  Laterality: Left;   BREAST EXCISIONAL BIOPSY Right    benign   CARPAL TUNNEL RELEASE Bilateral 1993 and [place]:  09-25-1999   BILATERAL TUBAL LIGATION W/ LAST C/S   DILATION AND CURETTAGE OF UTERUS     EXPLORATORY [place] / LYSIS ADHESIONS/  BILATERAL SALPINGOOPHORECTOMY  07-20-2011  Cassandra Campos [place]  [facility]   FISTULA SUPERFICIALIZATION Left 08/28/2015   Procedure: BRACHIOCEPHALIC FISTULA SUPERFICIALIZATION;  Surgeon: Mal Misty, MD;  Location: [place];  Service: Vascular;  Laterality: Left;    PATCH ANGIOPLASTY Left 02/14/2017   Procedure: PATCH ANGIOPLASTY;  Surgeon: Elam Dutch, MD;  Location: [place];  Service: Vascular;  Laterality: Left;   PELVIC LAPAROSCOPY  1994   exploration for ectopic preg.   RETINAL DETACHMENT SURGERY Left 2015   REVISON OF ARTERIOVENOUS FISTULA Left 02/14/2017   Procedure: REVISON OF ARTERIOVENOUS FISTULA  LEFT ARM;  Surgeon: Elam Dutch, MD;  Location: [place];  Service: Vascular;  Laterality: Left;   TRIGGER FINGER RELEASE Left 2015   thumb   TRIGGER FINGER RELEASE Right 09/09/2017   Procedure: RELEASE TRIGGER FINGER/A-1 PULLEY RIGHT THUMB;  Surgeon: Dorna Leitz, MD;  Location: [place];  Service: Orthopedics;  Laterality: Right;   Social History   Socioeconomic History   Marital status: Married  Spouse name: Not on file   Number of children: 2   Years of education: Not on file   Highest education level: Not on file  Occupational History   Occupation: medical billing and insurance  Tobacco Use   Smoking status: Never Smoker   Smokeless tobacco: Never Used  Vaping Use   Vaping Use: Never used  Substance and Sexual Activity   Alcohol use: Never    Alcohol/week: 0.0 standard drinks   Drug use: No   Sexual activity: Yes    Birth control/protection: Surgical    Comment: HYST-1st intercourse 49 yo-Fewer than 5 partners  Other Topics Concern   Not on file  Social History Narrative   Not on file   Social Determinants of Health   Financial Resource Strain:    Difficulty of Paying Living Expenses: Not on file  Food Insecurity:    Worried About Charity fundraiser in the Last Year: Not on file   [employer] of Food in the Last Year: Not on file  Transportation Needs:    Lack of Transportation (Medical): Not on file   Lack of Transportation (Non-Medical): Not on file  Physical Activity:    Days of Exercise per Week: Not on file   Minutes of Exercise per Session: Not on file  Stress:     Feeling of Stress : Not on file  Social Connections:    Frequency of Communication with Friends and Family: Not on file   Frequency of Social Gatherings with Friends and Family: Not on file   Attends Religious Services: Not on file   Active Member of [place] or Organizations: Not on file   Attends Archivist Meetings: Not on file   Marital Status: Not on file  Intimate Partner Violence:    Fear of Current or Ex-Partner: Not on file   Emotionally Abused: Not on file   Physically Abused: Not on file   Sexually Abused: Not on file   Current Outpatient Medications on File Prior to Visit  Medication Sig Dispense Refill   allopurinol (ZYLOPRIM) 300 MG tablet TAKE 1 TABLET BY MOUTH EVERY MORNING 90 tablet 1   Armodafinil 150 MG tablet Take 1 tablet (150 mg total) by mouth daily. 30 tablet 5   B Complex-C-Folic Acid (RENAL) 1 MG CAPS TAKE 1 CAPSULE BY MOUTH DAILY (ON DIALYSIS DAYS, TAKE AFTER DIALYSIS TREATMENT )     calcitRIOL (ROCALTROL) 0.5 MCG capsule Take 0.5 mcg by mouth daily.     cinacalcet (SENSIPAR) 60 MG tablet Take 60 mg by mouth daily. Every other day     cromolyn (OPTICROM) 4 % ophthalmic solution 1 drop 4 (four) times daily.     HUMULIN R 500 UNIT/ML injection Inject under skin up to 200 units in the morning and up to 150 units in the evening 20 mL 11   HUMULIN R U-500 KWIKPEN 500 UNIT/ML kwikpen INJECT 180 UNITS SUBQ AT BEGINNING OF BREAKFAST AND 140 UNITS AT BEGINNING OF EVENING MEAL (30 MINUTES BEFORE MEALS) 16 mL 11   Insulin Pen Needle 32G X 4 MM MISC Use 2x a day 200 each 3   lanthanum (FOSRENOL) 1000 MG chewable tablet Chew 1,000 mg by mouth 3 (three) times daily with meals.     lidocaine-prilocaine (EMLA) cream USE ON ARAMS AS NEEDED FOR DIALYSIS  10   Microlet Lancets MISC Use as directed to check blood sugars 4 times per day dx: e11.22 300 each 2   MITIGARE 0.6 MG CAPS  TAKE ONE CAPSULE BY MOUTH ONCE DAILY 90 capsule 0   pregabalin (LYRICA)  100 MG capsule TAKE 1 CAPSULE BY MOUTH IN THE MORNING FOR NEUROPATHY 90 capsule 1   pregabalin (LYRICA) 75 MG capsule TAKE ONE CAPSULE BY MOUTH AT BEDTIME 90 capsule 2   RESTASIS 0.05 % ophthalmic emulsion INSTILL 1 DROP INTO BOTH EYES TWICE A DAY  3   rosuvastatin (CRESTOR) 20 MG tablet TAKE 1 TABLET BY MOUTH EVERY DAY 90 tablet 1   Semaglutide, 1 MG/DOSE, (OZEMPIC, 1 MG/DOSE,) 4 MG/3ML SOPN Inject 0.75 mLs (1 mg total) into the skin once a week. 9 mL 3   No current facility-administered medications on file prior to visit.   No Known Allergies Family History  Problem Relation Age of Onset   Diabetes Mother    Hypertension Mother    Thyroid disease Mother    Diabetes Father    Hypertension Father    Campos Father        STOMACH   Stomach Campos Father    Stroke Maternal Grandmother    Stroke Maternal Grandfather    Colon Campos Neg Hx    Rectal Campos Neg Hx     PE: There were no vitals taken for this visit. Wt Readings from Last 3 Encounters:  03/05/20 230 lb (104.3 kg)  02/25/20 227 lb (103 kg)  01/31/20 226 lb (102.5 kg)   Constitutional:  in NAD  The physical exam was not performed (we had to convert the virtual visit into a telephone visit).  ASSESSMENT: 1. DM2, insulin-dependent, uncontrolled, with complications - ESRD, on HD, awaiting kidney transplant - Cassandra - PN  2. HL  3.  Obesity class III  PLAN:  1. Patient with longstanding, uncontrolled, type 2 diabetes, on injectable antidiabetic regimen with U500 insulin and also Ozempic added back at last visit at that time, she was supposed to take the medication but she was forgetting most of the doses.  HbA1c was 8.7%, above the threshold for transplant.  However, she had another HbA1c since last visit and this was 7.9%, just under the threshold. -At previous visit, sugars are fluctuating widely and I suspect that some of these variation could have been due to insulin dialyzing off.  She was taking her  evening insulin 30 minutes before dinner, however, dinner was occasionally right before or right after dialysis.  When she took the insulin after dialysis, after 10 PM, she noticed that her sugars were dropping in the morning.  We discussed about reducing the dose of insulin if she had to have a late dinner, and I advised her to use a higher dose of insulin if she eats the night before dialysis.  At last visit, we again discussed about experimenting with different dinner mealtimes but we did not change her regimen. -At today's visit, sugars appear to be significantly improved.  She does not have any more blood sugars in the 400s.  However, she still has blood sugars in the 200s and she mentions that these are related to what she eats.  We discussed about using a higher dose of U500 insulin if she is eating a meal that normally increases her blood sugars, but otherwise, I do not feel we need to change her regimen.  She did not have significant low blood sugar since last visit.  She is not checking sugars at bedtime, after dialysis and I advised her again to try to check some to see if we need to change her U500  insulin before dinner. - I suggested to:  Patient Instructions  Please continue: - Ozempic 1 mg weekly - U500 insulin: - 180-195 units before breakfast - 140-150 units before dinner if dinner is early, and 110-120 units if dinner is after dialysis.  Please check sugars 3 times a day, before meals and occasionally at bedtime.  Please return in 3 months with your sugar log.   - advised to check sugars at different times of the day - 3x a day, rotating check times - advised for yearly eye exams >> she is UTD - return to clinic in 3 months  2. HL -Reviewed latest lipid panel from 07/2019: LDL was above goal of less than 70, triglycerides high: Lab Results  Component Value Date   CHOL 192 08/07/2019   HDL 49 08/07/2019   LDLCALC 96 08/07/2019   TRIG 280 (H) 08/07/2019   CHOLHDL 3.9  08/07/2019  -Continues Crestor 20 without side effects.  3.  Obesity class III -She started going to the weight management clinic before last visit - but the meals were not suited for her 2/2 HD -At last visit, I advised her to restart Ozempic, which should also help with weight loss - wt loss: 5 lbs since last Ov  - Total time spent for the visit: 20 min, in obtaining medical information from the chart, reviewing her  previous labs, imaging evaluations, and treatments, reviewing her symptoms, counseling her about her condition (please see the discussed topics above), establishing a virtual/telephone connection and developing a plan to further investigate and treat her medical conditions; she had a number of questions which I addressed.  Philemon Kingdom, MD PhD [facility] : [facility]

## 2020-04-08 NOTE — Telephone Encounter (Signed)
Spoke to pt and made appt for tomorrow at 1430 with MM/NP for cpap f/u.

## 2020-04-08 NOTE — Telephone Encounter (Signed)
Pt has been set up with this machine since 2017, this is not a initial cpap compliance visit, just overall compliance visit. Insurance should not be an issue with the exception of getting her supplies covered and pt must be compliant using the machine.

## 2020-04-08 NOTE — Telephone Encounter (Signed)
Pt called to schedule an appt to keep my insurance to cover my CPAP. I need a earlier appt. Would like a call from the nurse.

## 2020-04-09 ENCOUNTER — Telehealth (INDEPENDENT_AMBULATORY_CARE_PROVIDER_SITE_OTHER): Payer: BC Managed Care – PPO | Admitting: Adult Health

## 2020-04-09 DIAGNOSIS — Z9989 Dependence on other enabling machines and devices: Secondary | ICD-10-CM

## 2020-04-09 DIAGNOSIS — G4733 Obstructive sleep apnea (adult) (pediatric): Secondary | ICD-10-CM | POA: Diagnosis not present

## 2020-04-09 NOTE — Progress Notes (Signed)
[facility] [address] [place]. [place] [address] [phone]     Virtual Visit via Telephone Note  I connected with Cassandra Campos on 04/09/20 at  2:30 PM EST by telephone located remotely at [facility] and verified that I am speaking with the correct person using two identifiers who reports being located at home.   Visit scheduled by NP. She discussed the limitations, risks, security and privacy concerns of performing an evaluation and management service by telephone and the availability of in person appointments. I also discussed with the patient that there may be a patient responsible charge related to this service. The patient expressed understanding and agreed to proceed. See telephone note for consent and additional scheduling information.    History of Present Illness:  Cassandra Campos is a 49 y.o. female who has been followed in this office for OSA on CPAP. She was initially scheduled for Video visit up  today  but due to  lack of access to device with camera about was changed to telephone.   [name] is a 50 year old female with a history of obstructive sleep apnea on CPAP.  She returns today for follow-up via virtual visit.  Her download indicates that she use her machine 21 out of 30 days for compliance of 70%.  She used her machine greater than 4 hours 20 days for compliance of 67%.  On average she uses her machine 5 hours and 58 minutes.  Her residual AHI is 0.3 on 6 to 17 cm of water with EPR 2.  Leak in the 95th percentile is 9.8 L/min.  She returns today for evaluation.  HISTORY 10/09/19 at  3:30 PM-  Cassandra Campos is a 49 y.o. female patient with a history of obstructive sleep apnea, CKD stage IV with renovascular hypertension, DM,  and OSA -manifesting excessive daytime sleepiness when seen in her  last 2 virtual visits.   Home sleep test confirmed a diagnosis of OSA- at the patient has given CPAP  a good trial.  She now reports that she feels really much better when she is regularly using the device and she had noted it just recently when she ran out of this elevated water how much less restorative sleep was for that night.  She has been 80% compliant has used the machine 24 out of 30 points based, with an average use at time of 5 hours and 41 minutes the AutoSet has a minimum pressure of 6 maximum pressure of 16 cmH2O was 2 cm EPR her residual apnea-hypopnea index is only 0.5/h.   She has a dry mouth in the morning, and she is wondering if she should use a FFM . This is an excellent resolution of apnea the 95th percentile pressure is 14.8 and there are no central apneas arising and there is no major air leakage noted.   I think she may actually do better if we allow her maximum pressure to be raised from 16-18.  In addition I have reviewed her Epworth Sleepiness Scale which is still at 18 out of 24 indicating that she is indeed a sleepy individual and she has a high degree of fatigue.  Resistance and is treated with hemodialysis she has an AV fistula in her left arm for access, her attending is Dr. Jenny Reichmann [place].      Observations/Objective:  Generalized: Well developed, in no acute distress   Neurological examination  Mentation: Alert oriented to time, place, history taking. Follows all commands  speech and language fluent  Assessment and Plan:  1: OSA on CPAP  -Compliance is suboptimal -Residual AHI is in normal range -Encouraged patient using CPAP nightly and greater than 4 hours each night   Follow Up Instructions:   F/U in 1 year    I discussed the assessment and treatment plan with the patient.  The patient was provided an opportunity to ask questions and all were answered to their satisfaction. The patient agreed with the plan and verbalized an understanding of the instructions.   I provided 10 minutes of non-face-to-face time during this encounter.    Ward Givens NP-C  [facility] [address] [place], [place] [address]  Phone [phone] Fax [phone] \

## 2020-04-14 ENCOUNTER — Other Ambulatory Visit: Payer: Self-pay | Admitting: Internal Medicine

## 2020-05-02 IMAGING — CR DG ABDOMEN 1V
1 series · 1 of 1 positions shown · non-contrast
Comparison: 11/10/2014

CLINICAL DATA: Lower abdominal pain.

EXAM:
ABDOMEN - 1 VIEW

[w abdomen upright]
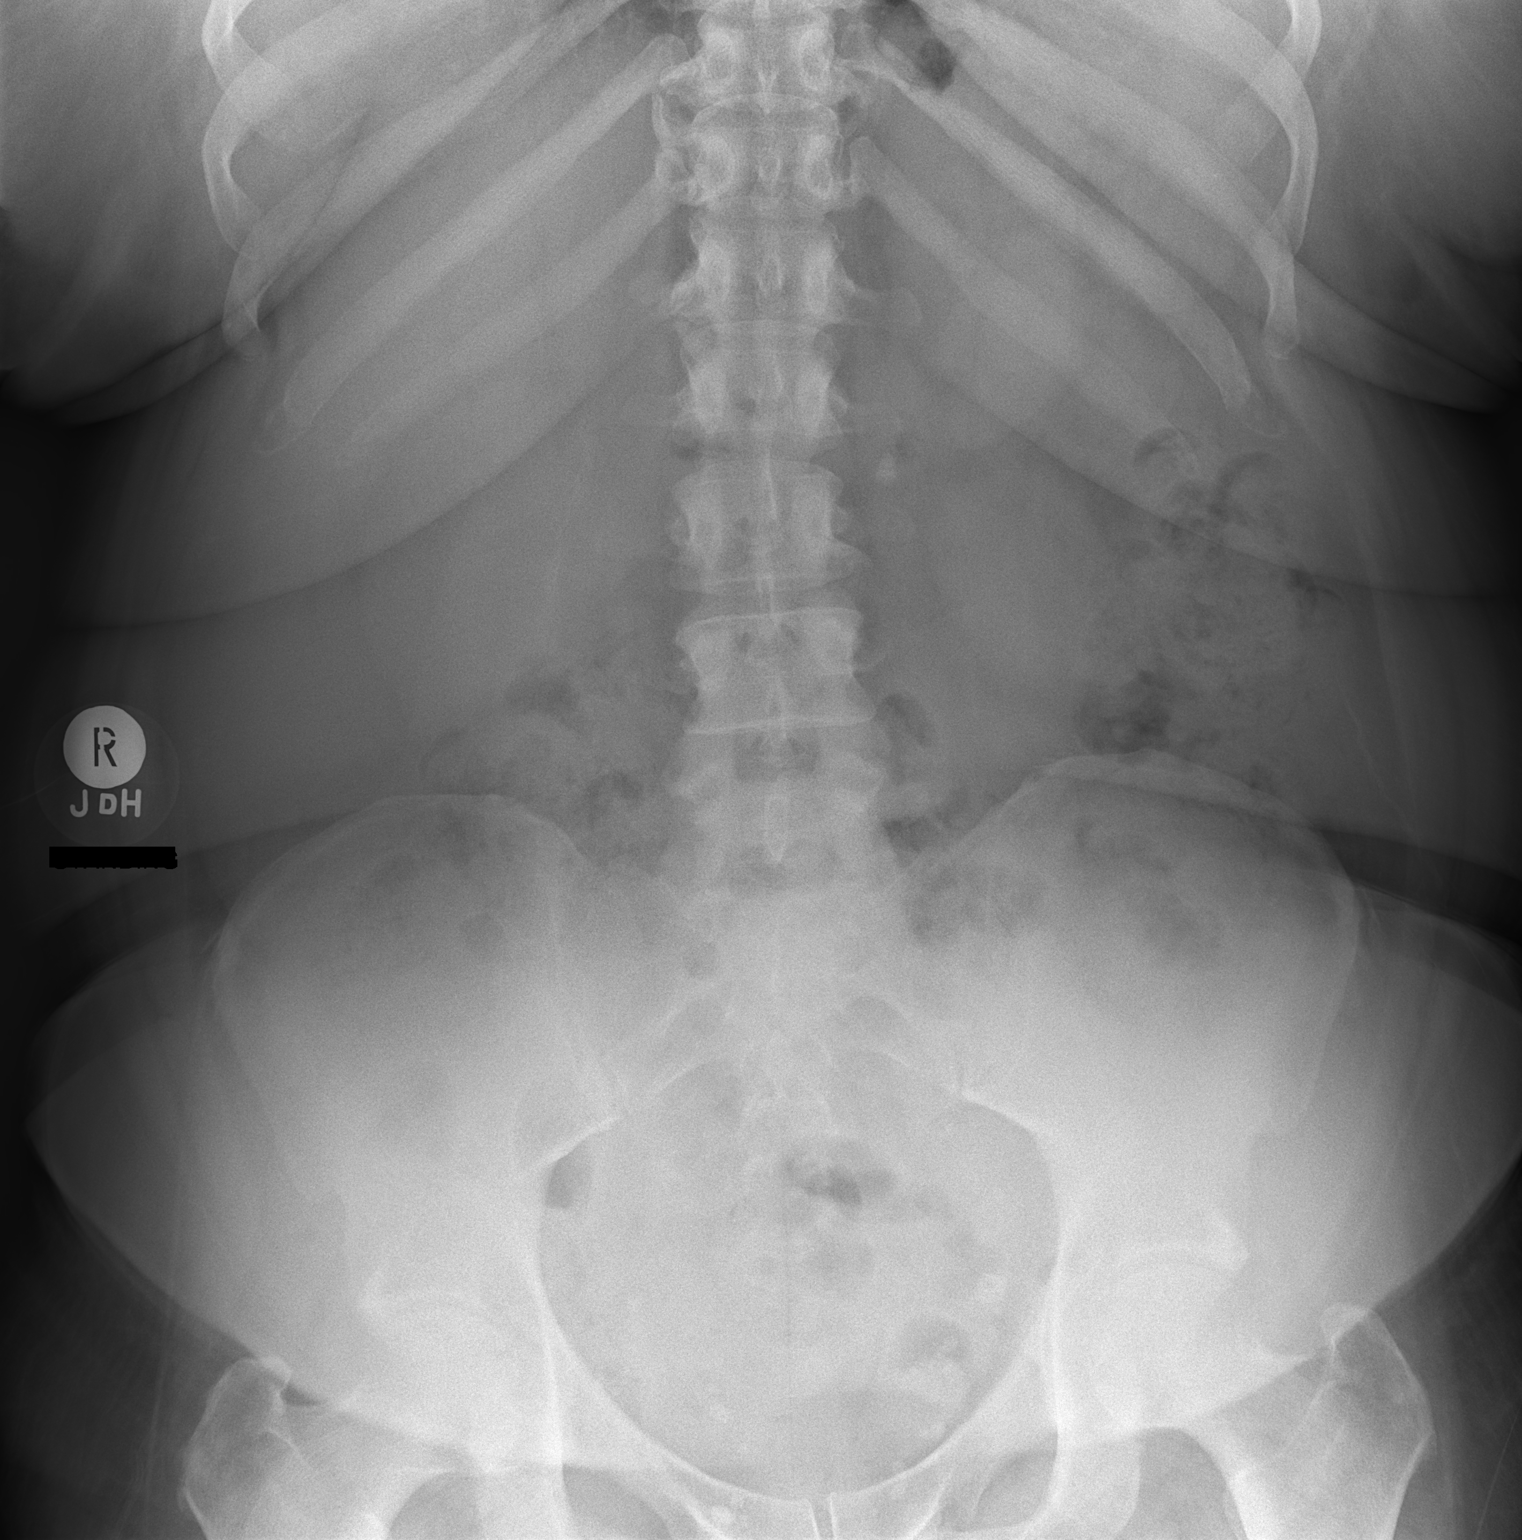

[1 of 1 positions shown; findings below may reference images not displayed]

FINDINGS: Bowel gas pattern is normal. Minimal air scattered throughout the
bowel. No excessive stool in the colon. No fecal impaction.

No visible free air or free fluid.

Numerous phleboliths in the pelvis. Calcification to the left of the
lumbar spine at L2-3 is unchanged since the prior exam and probably
vascular in origin.

No bone abnormality.
IMPRESSION: Benign-appearing abdomen.  No excessive stool.

## 2020-05-16 DIAGNOSIS — N186 End stage renal disease: Secondary | ICD-10-CM | POA: Diagnosis not present

## 2020-05-16 DIAGNOSIS — E1122 Type 2 diabetes mellitus with diabetic chronic kidney disease: Secondary | ICD-10-CM | POA: Diagnosis not present

## 2020-05-16 DIAGNOSIS — Z992 Dependence on renal dialysis: Secondary | ICD-10-CM | POA: Diagnosis not present

## 2020-05-19 DIAGNOSIS — Z4931 Encounter for adequacy testing for hemodialysis: Secondary | ICD-10-CM | POA: Diagnosis not present

## 2020-05-19 DIAGNOSIS — I953 Hypotension of hemodialysis: Secondary | ICD-10-CM | POA: Diagnosis not present

## 2020-05-19 DIAGNOSIS — D631 Anemia in chronic kidney disease: Secondary | ICD-10-CM | POA: Diagnosis not present

## 2020-05-19 DIAGNOSIS — N186 End stage renal disease: Secondary | ICD-10-CM | POA: Diagnosis not present

## 2020-05-19 DIAGNOSIS — N2581 Secondary hyperparathyroidism of renal origin: Secondary | ICD-10-CM | POA: Diagnosis not present

## 2020-05-19 DIAGNOSIS — Z79899 Other long term (current) drug therapy: Secondary | ICD-10-CM | POA: Diagnosis not present

## 2020-05-19 DIAGNOSIS — E1129 Type 2 diabetes mellitus with other diabetic kidney complication: Secondary | ICD-10-CM | POA: Diagnosis not present

## 2020-05-19 DIAGNOSIS — K760 Fatty (change of) liver, not elsewhere classified: Secondary | ICD-10-CM | POA: Diagnosis not present

## 2020-05-19 DIAGNOSIS — Z992 Dependence on renal dialysis: Secondary | ICD-10-CM | POA: Diagnosis not present

## 2020-05-20 DIAGNOSIS — I953 Hypotension of hemodialysis: Secondary | ICD-10-CM | POA: Diagnosis not present

## 2020-05-20 DIAGNOSIS — Z79899 Other long term (current) drug therapy: Secondary | ICD-10-CM | POA: Diagnosis not present

## 2020-05-20 DIAGNOSIS — E1129 Type 2 diabetes mellitus with other diabetic kidney complication: Secondary | ICD-10-CM | POA: Diagnosis not present

## 2020-05-20 DIAGNOSIS — Z992 Dependence on renal dialysis: Secondary | ICD-10-CM | POA: Diagnosis not present

## 2020-05-20 DIAGNOSIS — Z4931 Encounter for adequacy testing for hemodialysis: Secondary | ICD-10-CM | POA: Diagnosis not present

## 2020-05-20 DIAGNOSIS — D631 Anemia in chronic kidney disease: Secondary | ICD-10-CM | POA: Diagnosis not present

## 2020-05-20 DIAGNOSIS — K760 Fatty (change of) liver, not elsewhere classified: Secondary | ICD-10-CM | POA: Diagnosis not present

## 2020-05-20 DIAGNOSIS — N2581 Secondary hyperparathyroidism of renal origin: Secondary | ICD-10-CM | POA: Diagnosis not present

## 2020-05-20 DIAGNOSIS — N186 End stage renal disease: Secondary | ICD-10-CM | POA: Diagnosis not present

## 2020-05-22 DIAGNOSIS — K760 Fatty (change of) liver, not elsewhere classified: Secondary | ICD-10-CM | POA: Diagnosis not present

## 2020-05-22 DIAGNOSIS — Z4931 Encounter for adequacy testing for hemodialysis: Secondary | ICD-10-CM | POA: Diagnosis not present

## 2020-05-22 DIAGNOSIS — N2581 Secondary hyperparathyroidism of renal origin: Secondary | ICD-10-CM | POA: Diagnosis not present

## 2020-05-22 DIAGNOSIS — D631 Anemia in chronic kidney disease: Secondary | ICD-10-CM | POA: Diagnosis not present

## 2020-05-22 DIAGNOSIS — N186 End stage renal disease: Secondary | ICD-10-CM | POA: Diagnosis not present

## 2020-05-22 DIAGNOSIS — I953 Hypotension of hemodialysis: Secondary | ICD-10-CM | POA: Diagnosis not present

## 2020-05-22 DIAGNOSIS — E1129 Type 2 diabetes mellitus with other diabetic kidney complication: Secondary | ICD-10-CM | POA: Diagnosis not present

## 2020-05-22 DIAGNOSIS — Z79899 Other long term (current) drug therapy: Secondary | ICD-10-CM | POA: Diagnosis not present

## 2020-05-22 DIAGNOSIS — Z992 Dependence on renal dialysis: Secondary | ICD-10-CM | POA: Diagnosis not present

## 2020-05-23 DIAGNOSIS — D631 Anemia in chronic kidney disease: Secondary | ICD-10-CM | POA: Diagnosis not present

## 2020-05-23 DIAGNOSIS — N2581 Secondary hyperparathyroidism of renal origin: Secondary | ICD-10-CM | POA: Diagnosis not present

## 2020-05-23 DIAGNOSIS — E1129 Type 2 diabetes mellitus with other diabetic kidney complication: Secondary | ICD-10-CM | POA: Diagnosis not present

## 2020-05-23 DIAGNOSIS — Z4931 Encounter for adequacy testing for hemodialysis: Secondary | ICD-10-CM | POA: Diagnosis not present

## 2020-05-23 DIAGNOSIS — Z992 Dependence on renal dialysis: Secondary | ICD-10-CM | POA: Diagnosis not present

## 2020-05-23 DIAGNOSIS — I953 Hypotension of hemodialysis: Secondary | ICD-10-CM | POA: Diagnosis not present

## 2020-05-23 DIAGNOSIS — N186 End stage renal disease: Secondary | ICD-10-CM | POA: Diagnosis not present

## 2020-05-23 DIAGNOSIS — Z79899 Other long term (current) drug therapy: Secondary | ICD-10-CM | POA: Diagnosis not present

## 2020-05-23 DIAGNOSIS — K760 Fatty (change of) liver, not elsewhere classified: Secondary | ICD-10-CM | POA: Diagnosis not present

## 2020-05-26 DIAGNOSIS — N186 End stage renal disease: Secondary | ICD-10-CM | POA: Diagnosis not present

## 2020-05-26 DIAGNOSIS — I953 Hypotension of hemodialysis: Secondary | ICD-10-CM | POA: Diagnosis not present

## 2020-05-26 DIAGNOSIS — E1129 Type 2 diabetes mellitus with other diabetic kidney complication: Secondary | ICD-10-CM | POA: Diagnosis not present

## 2020-05-26 DIAGNOSIS — Z992 Dependence on renal dialysis: Secondary | ICD-10-CM | POA: Diagnosis not present

## 2020-05-26 DIAGNOSIS — N2581 Secondary hyperparathyroidism of renal origin: Secondary | ICD-10-CM | POA: Diagnosis not present

## 2020-05-26 DIAGNOSIS — Z4931 Encounter for adequacy testing for hemodialysis: Secondary | ICD-10-CM | POA: Diagnosis not present

## 2020-05-26 DIAGNOSIS — K760 Fatty (change of) liver, not elsewhere classified: Secondary | ICD-10-CM | POA: Diagnosis not present

## 2020-05-26 DIAGNOSIS — Z79899 Other long term (current) drug therapy: Secondary | ICD-10-CM | POA: Diagnosis not present

## 2020-05-26 DIAGNOSIS — D631 Anemia in chronic kidney disease: Secondary | ICD-10-CM | POA: Diagnosis not present

## 2020-05-27 DIAGNOSIS — K760 Fatty (change of) liver, not elsewhere classified: Secondary | ICD-10-CM | POA: Diagnosis not present

## 2020-05-27 DIAGNOSIS — I953 Hypotension of hemodialysis: Secondary | ICD-10-CM | POA: Diagnosis not present

## 2020-05-27 DIAGNOSIS — Z992 Dependence on renal dialysis: Secondary | ICD-10-CM | POA: Diagnosis not present

## 2020-05-27 DIAGNOSIS — Z79899 Other long term (current) drug therapy: Secondary | ICD-10-CM | POA: Diagnosis not present

## 2020-05-27 DIAGNOSIS — E1129 Type 2 diabetes mellitus with other diabetic kidney complication: Secondary | ICD-10-CM | POA: Diagnosis not present

## 2020-05-27 DIAGNOSIS — Z4931 Encounter for adequacy testing for hemodialysis: Secondary | ICD-10-CM | POA: Diagnosis not present

## 2020-05-27 DIAGNOSIS — D631 Anemia in chronic kidney disease: Secondary | ICD-10-CM | POA: Diagnosis not present

## 2020-05-27 DIAGNOSIS — N186 End stage renal disease: Secondary | ICD-10-CM | POA: Diagnosis not present

## 2020-05-27 DIAGNOSIS — N2581 Secondary hyperparathyroidism of renal origin: Secondary | ICD-10-CM | POA: Diagnosis not present

## 2020-05-29 DIAGNOSIS — Z4931 Encounter for adequacy testing for hemodialysis: Secondary | ICD-10-CM | POA: Diagnosis not present

## 2020-05-29 DIAGNOSIS — D631 Anemia in chronic kidney disease: Secondary | ICD-10-CM | POA: Diagnosis not present

## 2020-05-29 DIAGNOSIS — K760 Fatty (change of) liver, not elsewhere classified: Secondary | ICD-10-CM | POA: Diagnosis not present

## 2020-05-29 DIAGNOSIS — E1129 Type 2 diabetes mellitus with other diabetic kidney complication: Secondary | ICD-10-CM | POA: Diagnosis not present

## 2020-05-29 DIAGNOSIS — N186 End stage renal disease: Secondary | ICD-10-CM | POA: Diagnosis not present

## 2020-05-29 DIAGNOSIS — Z79899 Other long term (current) drug therapy: Secondary | ICD-10-CM | POA: Diagnosis not present

## 2020-05-29 DIAGNOSIS — Z992 Dependence on renal dialysis: Secondary | ICD-10-CM | POA: Diagnosis not present

## 2020-05-29 DIAGNOSIS — I953 Hypotension of hemodialysis: Secondary | ICD-10-CM | POA: Diagnosis not present

## 2020-05-29 DIAGNOSIS — N2581 Secondary hyperparathyroidism of renal origin: Secondary | ICD-10-CM | POA: Diagnosis not present

## 2020-05-30 DIAGNOSIS — K760 Fatty (change of) liver, not elsewhere classified: Secondary | ICD-10-CM | POA: Diagnosis not present

## 2020-05-30 DIAGNOSIS — D631 Anemia in chronic kidney disease: Secondary | ICD-10-CM | POA: Diagnosis not present

## 2020-05-30 DIAGNOSIS — I953 Hypotension of hemodialysis: Secondary | ICD-10-CM | POA: Diagnosis not present

## 2020-05-30 DIAGNOSIS — E1129 Type 2 diabetes mellitus with other diabetic kidney complication: Secondary | ICD-10-CM | POA: Diagnosis not present

## 2020-05-30 DIAGNOSIS — N2581 Secondary hyperparathyroidism of renal origin: Secondary | ICD-10-CM | POA: Diagnosis not present

## 2020-05-30 DIAGNOSIS — N186 End stage renal disease: Secondary | ICD-10-CM | POA: Diagnosis not present

## 2020-05-30 DIAGNOSIS — Z992 Dependence on renal dialysis: Secondary | ICD-10-CM | POA: Diagnosis not present

## 2020-05-30 DIAGNOSIS — Z79899 Other long term (current) drug therapy: Secondary | ICD-10-CM | POA: Diagnosis not present

## 2020-05-30 DIAGNOSIS — Z4931 Encounter for adequacy testing for hemodialysis: Secondary | ICD-10-CM | POA: Diagnosis not present

## 2020-06-02 DIAGNOSIS — E1129 Type 2 diabetes mellitus with other diabetic kidney complication: Secondary | ICD-10-CM | POA: Diagnosis not present

## 2020-06-02 DIAGNOSIS — Z4931 Encounter for adequacy testing for hemodialysis: Secondary | ICD-10-CM | POA: Diagnosis not present

## 2020-06-02 DIAGNOSIS — N2581 Secondary hyperparathyroidism of renal origin: Secondary | ICD-10-CM | POA: Diagnosis not present

## 2020-06-02 DIAGNOSIS — I953 Hypotension of hemodialysis: Secondary | ICD-10-CM | POA: Diagnosis not present

## 2020-06-02 DIAGNOSIS — D631 Anemia in chronic kidney disease: Secondary | ICD-10-CM | POA: Diagnosis not present

## 2020-06-02 DIAGNOSIS — N186 End stage renal disease: Secondary | ICD-10-CM | POA: Diagnosis not present

## 2020-06-02 DIAGNOSIS — Z79899 Other long term (current) drug therapy: Secondary | ICD-10-CM | POA: Diagnosis not present

## 2020-06-02 DIAGNOSIS — Z992 Dependence on renal dialysis: Secondary | ICD-10-CM | POA: Diagnosis not present

## 2020-06-02 DIAGNOSIS — K760 Fatty (change of) liver, not elsewhere classified: Secondary | ICD-10-CM | POA: Diagnosis not present

## 2020-06-03 DIAGNOSIS — Z4931 Encounter for adequacy testing for hemodialysis: Secondary | ICD-10-CM | POA: Diagnosis not present

## 2020-06-03 DIAGNOSIS — Z79899 Other long term (current) drug therapy: Secondary | ICD-10-CM | POA: Diagnosis not present

## 2020-06-03 DIAGNOSIS — E1129 Type 2 diabetes mellitus with other diabetic kidney complication: Secondary | ICD-10-CM | POA: Diagnosis not present

## 2020-06-03 DIAGNOSIS — N2581 Secondary hyperparathyroidism of renal origin: Secondary | ICD-10-CM | POA: Diagnosis not present

## 2020-06-03 DIAGNOSIS — D631 Anemia in chronic kidney disease: Secondary | ICD-10-CM | POA: Diagnosis not present

## 2020-06-03 DIAGNOSIS — I953 Hypotension of hemodialysis: Secondary | ICD-10-CM | POA: Diagnosis not present

## 2020-06-03 DIAGNOSIS — Z992 Dependence on renal dialysis: Secondary | ICD-10-CM | POA: Diagnosis not present

## 2020-06-03 DIAGNOSIS — N186 End stage renal disease: Secondary | ICD-10-CM | POA: Diagnosis not present

## 2020-06-03 DIAGNOSIS — K760 Fatty (change of) liver, not elsewhere classified: Secondary | ICD-10-CM | POA: Diagnosis not present

## 2020-06-05 DIAGNOSIS — N186 End stage renal disease: Secondary | ICD-10-CM | POA: Diagnosis not present

## 2020-06-05 DIAGNOSIS — D631 Anemia in chronic kidney disease: Secondary | ICD-10-CM | POA: Diagnosis not present

## 2020-06-05 DIAGNOSIS — N2581 Secondary hyperparathyroidism of renal origin: Secondary | ICD-10-CM | POA: Diagnosis not present

## 2020-06-05 DIAGNOSIS — Z4931 Encounter for adequacy testing for hemodialysis: Secondary | ICD-10-CM | POA: Diagnosis not present

## 2020-06-05 DIAGNOSIS — Z992 Dependence on renal dialysis: Secondary | ICD-10-CM | POA: Diagnosis not present

## 2020-06-05 DIAGNOSIS — Z79899 Other long term (current) drug therapy: Secondary | ICD-10-CM | POA: Diagnosis not present

## 2020-06-05 DIAGNOSIS — I953 Hypotension of hemodialysis: Secondary | ICD-10-CM | POA: Diagnosis not present

## 2020-06-05 DIAGNOSIS — K760 Fatty (change of) liver, not elsewhere classified: Secondary | ICD-10-CM | POA: Diagnosis not present

## 2020-06-05 DIAGNOSIS — E1129 Type 2 diabetes mellitus with other diabetic kidney complication: Secondary | ICD-10-CM | POA: Diagnosis not present

## 2020-06-06 DIAGNOSIS — N186 End stage renal disease: Secondary | ICD-10-CM | POA: Diagnosis not present

## 2020-06-06 DIAGNOSIS — Z4931 Encounter for adequacy testing for hemodialysis: Secondary | ICD-10-CM | POA: Diagnosis not present

## 2020-06-06 DIAGNOSIS — E1129 Type 2 diabetes mellitus with other diabetic kidney complication: Secondary | ICD-10-CM | POA: Diagnosis not present

## 2020-06-06 DIAGNOSIS — I953 Hypotension of hemodialysis: Secondary | ICD-10-CM | POA: Diagnosis not present

## 2020-06-06 DIAGNOSIS — K760 Fatty (change of) liver, not elsewhere classified: Secondary | ICD-10-CM | POA: Diagnosis not present

## 2020-06-06 DIAGNOSIS — N2581 Secondary hyperparathyroidism of renal origin: Secondary | ICD-10-CM | POA: Diagnosis not present

## 2020-06-06 DIAGNOSIS — Z992 Dependence on renal dialysis: Secondary | ICD-10-CM | POA: Diagnosis not present

## 2020-06-06 DIAGNOSIS — Z79899 Other long term (current) drug therapy: Secondary | ICD-10-CM | POA: Diagnosis not present

## 2020-06-06 DIAGNOSIS — D631 Anemia in chronic kidney disease: Secondary | ICD-10-CM | POA: Diagnosis not present

## 2020-06-09 DIAGNOSIS — Z79899 Other long term (current) drug therapy: Secondary | ICD-10-CM | POA: Diagnosis not present

## 2020-06-09 DIAGNOSIS — E1129 Type 2 diabetes mellitus with other diabetic kidney complication: Secondary | ICD-10-CM | POA: Diagnosis not present

## 2020-06-09 DIAGNOSIS — I953 Hypotension of hemodialysis: Secondary | ICD-10-CM | POA: Diagnosis not present

## 2020-06-09 DIAGNOSIS — N2581 Secondary hyperparathyroidism of renal origin: Secondary | ICD-10-CM | POA: Diagnosis not present

## 2020-06-09 DIAGNOSIS — N186 End stage renal disease: Secondary | ICD-10-CM | POA: Diagnosis not present

## 2020-06-09 DIAGNOSIS — K760 Fatty (change of) liver, not elsewhere classified: Secondary | ICD-10-CM | POA: Diagnosis not present

## 2020-06-09 DIAGNOSIS — Z992 Dependence on renal dialysis: Secondary | ICD-10-CM | POA: Diagnosis not present

## 2020-06-09 DIAGNOSIS — D631 Anemia in chronic kidney disease: Secondary | ICD-10-CM | POA: Diagnosis not present

## 2020-06-09 DIAGNOSIS — Z4931 Encounter for adequacy testing for hemodialysis: Secondary | ICD-10-CM | POA: Diagnosis not present

## 2020-06-10 DIAGNOSIS — D631 Anemia in chronic kidney disease: Secondary | ICD-10-CM | POA: Diagnosis not present

## 2020-06-10 DIAGNOSIS — Z79899 Other long term (current) drug therapy: Secondary | ICD-10-CM | POA: Diagnosis not present

## 2020-06-10 DIAGNOSIS — N186 End stage renal disease: Secondary | ICD-10-CM | POA: Diagnosis not present

## 2020-06-10 DIAGNOSIS — I953 Hypotension of hemodialysis: Secondary | ICD-10-CM | POA: Diagnosis not present

## 2020-06-10 DIAGNOSIS — K760 Fatty (change of) liver, not elsewhere classified: Secondary | ICD-10-CM | POA: Diagnosis not present

## 2020-06-10 DIAGNOSIS — Z992 Dependence on renal dialysis: Secondary | ICD-10-CM | POA: Diagnosis not present

## 2020-06-10 DIAGNOSIS — N2581 Secondary hyperparathyroidism of renal origin: Secondary | ICD-10-CM | POA: Diagnosis not present

## 2020-06-10 DIAGNOSIS — E1129 Type 2 diabetes mellitus with other diabetic kidney complication: Secondary | ICD-10-CM | POA: Diagnosis not present

## 2020-06-10 DIAGNOSIS — Z4931 Encounter for adequacy testing for hemodialysis: Secondary | ICD-10-CM | POA: Diagnosis not present

## 2020-06-12 DIAGNOSIS — K760 Fatty (change of) liver, not elsewhere classified: Secondary | ICD-10-CM | POA: Diagnosis not present

## 2020-06-12 DIAGNOSIS — D631 Anemia in chronic kidney disease: Secondary | ICD-10-CM | POA: Diagnosis not present

## 2020-06-12 DIAGNOSIS — Z992 Dependence on renal dialysis: Secondary | ICD-10-CM | POA: Diagnosis not present

## 2020-06-12 DIAGNOSIS — I953 Hypotension of hemodialysis: Secondary | ICD-10-CM | POA: Diagnosis not present

## 2020-06-12 DIAGNOSIS — N186 End stage renal disease: Secondary | ICD-10-CM | POA: Diagnosis not present

## 2020-06-12 DIAGNOSIS — Z79899 Other long term (current) drug therapy: Secondary | ICD-10-CM | POA: Diagnosis not present

## 2020-06-12 DIAGNOSIS — N2581 Secondary hyperparathyroidism of renal origin: Secondary | ICD-10-CM | POA: Diagnosis not present

## 2020-06-12 DIAGNOSIS — E1129 Type 2 diabetes mellitus with other diabetic kidney complication: Secondary | ICD-10-CM | POA: Diagnosis not present

## 2020-06-12 DIAGNOSIS — Z4931 Encounter for adequacy testing for hemodialysis: Secondary | ICD-10-CM | POA: Diagnosis not present

## 2020-06-13 DIAGNOSIS — N2581 Secondary hyperparathyroidism of renal origin: Secondary | ICD-10-CM | POA: Diagnosis not present

## 2020-06-13 DIAGNOSIS — K760 Fatty (change of) liver, not elsewhere classified: Secondary | ICD-10-CM | POA: Diagnosis not present

## 2020-06-13 DIAGNOSIS — Z79899 Other long term (current) drug therapy: Secondary | ICD-10-CM | POA: Diagnosis not present

## 2020-06-13 DIAGNOSIS — E1129 Type 2 diabetes mellitus with other diabetic kidney complication: Secondary | ICD-10-CM | POA: Diagnosis not present

## 2020-06-13 DIAGNOSIS — I953 Hypotension of hemodialysis: Secondary | ICD-10-CM | POA: Diagnosis not present

## 2020-06-13 DIAGNOSIS — Z4931 Encounter for adequacy testing for hemodialysis: Secondary | ICD-10-CM | POA: Diagnosis not present

## 2020-06-13 DIAGNOSIS — Z992 Dependence on renal dialysis: Secondary | ICD-10-CM | POA: Diagnosis not present

## 2020-06-13 DIAGNOSIS — N186 End stage renal disease: Secondary | ICD-10-CM | POA: Diagnosis not present

## 2020-06-13 DIAGNOSIS — D631 Anemia in chronic kidney disease: Secondary | ICD-10-CM | POA: Diagnosis not present

## 2020-06-16 DIAGNOSIS — Z4931 Encounter for adequacy testing for hemodialysis: Secondary | ICD-10-CM | POA: Diagnosis not present

## 2020-06-16 DIAGNOSIS — Z79899 Other long term (current) drug therapy: Secondary | ICD-10-CM | POA: Diagnosis not present

## 2020-06-16 DIAGNOSIS — K760 Fatty (change of) liver, not elsewhere classified: Secondary | ICD-10-CM | POA: Diagnosis not present

## 2020-06-16 DIAGNOSIS — Z992 Dependence on renal dialysis: Secondary | ICD-10-CM | POA: Diagnosis not present

## 2020-06-16 DIAGNOSIS — I953 Hypotension of hemodialysis: Secondary | ICD-10-CM | POA: Diagnosis not present

## 2020-06-16 DIAGNOSIS — E1129 Type 2 diabetes mellitus with other diabetic kidney complication: Secondary | ICD-10-CM | POA: Diagnosis not present

## 2020-06-16 DIAGNOSIS — D631 Anemia in chronic kidney disease: Secondary | ICD-10-CM | POA: Diagnosis not present

## 2020-06-16 DIAGNOSIS — N2581 Secondary hyperparathyroidism of renal origin: Secondary | ICD-10-CM | POA: Diagnosis not present

## 2020-06-16 DIAGNOSIS — N186 End stage renal disease: Secondary | ICD-10-CM | POA: Diagnosis not present

## 2020-07-03 ENCOUNTER — Ambulatory Visit: Payer: BC Managed Care – PPO | Admitting: Internal Medicine

## 2020-07-14 ENCOUNTER — Telehealth: Payer: Self-pay | Admitting: Adult Health

## 2020-07-14 DIAGNOSIS — G4733 Obstructive sleep apnea (adult) (pediatric): Secondary | ICD-10-CM

## 2020-07-14 DIAGNOSIS — Z9989 Dependence on other enabling machines and devices: Secondary | ICD-10-CM

## 2020-07-14 NOTE — Telephone Encounter (Signed)
Pt is asking for a call to discuss how she has not been able to sleep while on the CPAP but has been when not using the CPAP.  Pt told by DME that she may need a script for a new one.  Please call pt to discuss

## 2020-07-14 NOTE — Telephone Encounter (Signed)
I spoke to pt .  Will have a look at download. She states that sx started about 1 wk ago.  She has nasal pillows/ feels like pressure is too much.  Will let her know what download shows. In box.

## 2020-07-15 NOTE — Telephone Encounter (Signed)
Spoke to pt and let her know that this pressure change was done and to let us know if any more issues.  She verbalized understanding.

## 2020-07-15 NOTE — Telephone Encounter (Signed)
I will decrease her pressure 6-16 this is already been completed and order has been placed.

## 2020-07-15 NOTE — Addendum Note (Signed)
Addended by: Trudie Buckler on: 07/15/2020 04:25 PM   Modules accepted: Orders

## 2020-07-17 DIAGNOSIS — G4733 Obstructive sleep apnea (adult) (pediatric): Secondary | ICD-10-CM | POA: Diagnosis not present

## 2020-07-24 ENCOUNTER — Encounter: Payer: BC Managed Care – PPO | Admitting: Obstetrics and Gynecology

## 2020-07-30 ENCOUNTER — Encounter: Payer: Self-pay | Admitting: Internal Medicine

## 2020-07-30 ENCOUNTER — Other Ambulatory Visit: Payer: Self-pay

## 2020-07-30 ENCOUNTER — Ambulatory Visit (INDEPENDENT_AMBULATORY_CARE_PROVIDER_SITE_OTHER): Payer: 59 | Admitting: Internal Medicine

## 2020-07-30 VITALS — BP 118/72 | HR 85 | Ht 62.0 in | Wt 229.6 lb

## 2020-07-30 DIAGNOSIS — E1169 Type 2 diabetes mellitus with other specified complication: Secondary | ICD-10-CM | POA: Diagnosis not present

## 2020-07-30 DIAGNOSIS — E1122 Type 2 diabetes mellitus with diabetic chronic kidney disease: Secondary | ICD-10-CM

## 2020-07-30 DIAGNOSIS — Z6839 Body mass index (BMI) 39.0-39.9, adult: Secondary | ICD-10-CM

## 2020-07-30 DIAGNOSIS — Z794 Long term (current) use of insulin: Secondary | ICD-10-CM | POA: Diagnosis not present

## 2020-07-30 DIAGNOSIS — N186 End stage renal disease: Secondary | ICD-10-CM

## 2020-07-30 DIAGNOSIS — E785 Hyperlipidemia, unspecified: Secondary | ICD-10-CM | POA: Diagnosis not present

## 2020-07-30 DIAGNOSIS — Z992 Dependence on renal dialysis: Secondary | ICD-10-CM | POA: Diagnosis not present

## 2020-07-30 LAB — POCT GLYCOSYLATED HEMOGLOBIN (HGB A1C): Hemoglobin A1C: 8.5 % — AB (ref 4.0–5.6)

## 2020-07-30 MED ORDER — FREESTYLE LIBRE 2 SENSOR MISC: 1.0000 | 3 refills | Status: DC

## 2020-07-30 MED ORDER — FREESTYLE LIBRE 2 READER DEVI: 1.0000 | Freq: Every day | 0 refills | Status: DC

## 2020-07-30 NOTE — Progress Notes (Signed)
Patient ID: Cassandra Campos, female   DOB: 01/14/1971, 50 y.o.   MRN: [id number]   This visit occurred during the SARS-CoV-2 public health emergency.  Safety protocols were in place, including screening questions prior to the visit, additional usage of staff PPE, and extensive cleaning of exam room while observing appropriate contact time as indicated for disinfecting solutions.   HPI: Cassandra Campos is a 50 y.o.-year-old female, returning for follow-up for DM2, dx in 1997, insulin-dependent right after dx, uncontrolled, with complications (ESRD-on HD since 12/2017, DR, PN).  Last visit 3 months ago (virtual).  Interim hx: Since last visit, she has some more hot flashes. Her sugars increased after our last visit feels that they have improved more recently.  Reviewed HbA1c levels: 05/26/2020: HbA1c 9.8% Lab Results  Component Value Date   HGBA1C 7.9 (H) 01/08/2020   HGBA1C 8.7 (A) 09/11/2019   HGBA1C 10.9 (H) 04/19/2019   HGBA1C 6.9 (H) 11/09/2018   HGBA1C 7.1 (H) 07/11/2018   HGBA1C 8.5 (H) 03/27/2018   HGBA1C 8.2 (H) 02/08/2013   Pt is on a regimen of: - Ozempic 1 mg weekly - U500 insulin (started 2020) - 180-195 >> 200 units before breakfast - 140-150 units before dinner if dinner is early, and 110-120 units if dinner is after dialysis. She was on Levemir and Humalog.  Pt checks her sugars 2-3 times a day: - am: n/c >> 78 -226, 407 >> 59-455 >> 97-203 >> 126-180, 200s - 2h after b'fast: n/c - before lunch: 92-300s >> 180-405 >> 101-245 >> 110-120 - 2h after lunch: n/c - before dinner: 96-300s > n/c >> 125-140 >> (6:30-7 pm): 58, 108, 120-150, 200s - 2h after dinner: n/c - bedtime: n/c   - nighttime: n/c Lowest sugar was 59 >> 97 >> 58 (before dinner); she has hypoglycemia awareness in the 60s. Highest sugar was 455 >> 260 >> 200s.  Glucometer: Contour next  Pt's meals are: - Breakfast: yoghurt and blueberries; egg McMuffin - Lunch: fish, tacos, subs,  cake - Dinner: salad, steak, shrimp - occasionally after HD at 10 pm, but more recently she has this during dialysis, around 7 PM - Snacks: fruit, peanuts  -+ End-stage renal disease, last BUN/creatinine:  Lab Results  Component Value Date   BUN 35 (H) 07/17/2019   BUN 17 09/20/2018   CREATININE 5.58 (H) 07/17/2019   CREATININE 3.85 (H) 09/20/2018   She is on hemodialysis: M, Tu, Th, F (home) - in the evening, for 3-4 hours: 6:30 PM to 10 PM She is on the kidney transplant list at [facility]. Requirements for kidney transplant: <200 lbs, HbA1c <8%.  -+ HL; last set of lipids: Lab Results  Component Value Date   CHOL 192 08/07/2019   HDL 49 08/07/2019   LDLCALC 96 08/07/2019   TRIG 280 (H) 08/07/2019   CHOLHDL 3.9 08/07/2019  On Crestor 20.  - last eye exam was on 03/20/2020: + DR. + h/o retinal detachment, + cataracts.  She is on IO injections.  - she has numbness and tingling in her feet.  She changed from Lyrica to Neurontin 300 mg daily since last visit.  Pt has FH of DM in M, F.  She also has a history of NAFLD, OSA (but not on CPAP, since she could not tolerate it-now on nasal pillows), gout.   ROS: Constitutional: no weight gain/no weight loss, no fatigue, + subjective hyperthermia, no subjective hypothermia Eyes: no blurry vision, no xerophthalmia ENT: no sore throat, no nodules  palpated in neck, no dysphagia, no odynophagia, no hoarseness Cardiovascular: no CP/no SOB/no palpitations/no leg swelling Respiratory: no cough/no SOB/no wheezing Gastrointestinal: no N/no V/no D/no C/no acid reflux Musculoskeletal: no muscle aches/no joint aches Skin: no rashes, no hair loss Neurological: no tremors/+ numbness/+ tingling/no dizziness  I reviewed pt's medications, allergies, PMH, social hx, family hx, and changes were documented in the history of present illness. Otherwise, unchanged from my initial visit note.  Past Medical History:  Diagnosis Date  . Anemia associated with  chronic renal failure   . ASCUS of cervix with negative high risk HPV 06/2017  . Back pain   . Chest pain   . CHF (congestive heart failure) ([place])   . CKD (chronic kidney disease), stage IV ([place]) no dialysis yet   nephrologist-  dr Jamal Maes-- scheduled next visit 09-08-2017 (lov 07-05-2017)  . Constipation   . Diabetic retinopathy ([place])   . Dialysis patient ([place])   . Edema, lower extremity   . Elevated transaminase level   . Fatty liver    FOLLOWED BY DR Henrene Pastor  . GERD (gastroesophageal reflux disease)   . Hypertension   . Idiopathic gout of multiple sites    09-05-2017 per pt stable  . Insulin dependent type 2 diabetes mellitus ([place])    followed by dr Toy Care  . Joint pain   . Mixed hyperlipidemia   . OSA (obstructive sleep apnea)    per study 10-20-2015 mild osa w/ AHI 10.3/hr;  09-05-2017 per pt has not used cpap since 1 yr ago  . Palpitations   . Peripheral neuropathy    feet  . S/P arteriovenous (AV) fistula creation 07-04-2015  w/ revision 02-14-2017   left braniocephilic  . Shortness of breath   . Trigger thumb, right thumb   . Umbilical hernia   . Vitamin D deficiency    Past Surgical History:  Procedure Laterality Date  . ABDOMINAL HYSTERECTOMY  07/11/2000   dr gott   TAH/BSO for irregular bleeding and leiomyoma  . AV FISTULA PLACEMENT Left 07/04/2015   Procedure: ARTERIOVENOUS (AV) FISTULA CREATION-LEFT;  Surgeon: Mal Misty, MD;  Location: [place];  Service: Vascular;  Laterality: Left;  . BREAST EXCISIONAL BIOPSY Right    benign  . CARPAL TUNNEL RELEASE Bilateral 1993 and 1994  . CESAREAN SECTION  1993:  09-25-1999   BILATERAL TUBAL LIGATION W/ LAST C/S  . DILATION AND CURETTAGE OF UTERUS    . EXPLORATORY LAPARTOMY / LYSIS ADHESIONS/  BILATERAL SALPINGOOPHORECTOMY  07-20-2011  dr s. Rhodia Albright  [facility]  . FISTULA SUPERFICIALIZATION Left 08/28/2015   Procedure: BRACHIOCEPHALIC FISTULA SUPERFICIALIZATION;  Surgeon: Mal Misty, MD;  Location: [place];  Service:  Vascular;  Laterality: Left;  . PATCH ANGIOPLASTY Left 02/14/2017   Procedure: PATCH ANGIOPLASTY;  Surgeon: Elam Dutch, MD;  Location: [place];  Service: Vascular;  Laterality: Left;  . PELVIC LAPAROSCOPY  1994   exploration for ectopic preg.  Marland Kitchen RETINAL DETACHMENT SURGERY Left 2015  . REVISON OF ARTERIOVENOUS FISTULA Left 02/14/2017   Procedure: REVISON OF ARTERIOVENOUS FISTULA  LEFT ARM;  Surgeon: Elam Dutch, MD;  Location: [place];  Service: Vascular;  Laterality: Left;  . TRIGGER FINGER RELEASE Left 2015   thumb  . TRIGGER FINGER RELEASE Right 09/09/2017   Procedure: RELEASE TRIGGER FINGER/A-1 PULLEY RIGHT THUMB;  Surgeon: Dorna Leitz, MD;  Location: [place];  Service: Orthopedics;  Laterality: Right;   Social History   Socioeconomic History  . Marital status:  Married    Spouse name: Not on file  . Number of children: 2  . Years of education: Not on file  . Highest education level: Not on file  Occupational History  . Occupation: Press photographer and insurance  Tobacco Use  . Smoking status: Never Smoker  . Smokeless tobacco: Never Used  Vaping Use  . Vaping Use: Never used  Substance and Sexual Activity  . Alcohol use: Never    Alcohol/week: 0.0 standard drinks  . Drug use: No  . Sexual activity: Yes    Birth control/protection: Surgical    Comment: HYST-1st intercourse 50 yo-Fewer than 5 partners  Other Topics Concern  . Not on file  Social History Narrative  . Not on file   Social Determinants of Health   Financial Resource Strain: Not on file  Food Insecurity: Not on file  Transportation Needs: Not on file  Physical Activity: Not on file  Stress: Not on file  Social Connections: Not on file  Intimate Partner Violence: Not on file   Current Outpatient Medications on File Prior to Visit  Medication Sig Dispense Refill  . allopurinol (ZYLOPRIM) 300 MG tablet TAKE 1 TABLET BY MOUTH EVERY MORNING 90 tablet 1  . Armodafinil 150 MG tablet  Take 1 tablet (150 mg total) by mouth daily. 30 tablet 5  . B Complex-C-Folic Acid (RENAL) 1 MG CAPS TAKE 1 CAPSULE BY MOUTH DAILY (ON DIALYSIS DAYS, TAKE AFTER DIALYSIS TREATMENT )    . calcitRIOL (ROCALTROL) 0.5 MCG capsule Take 0.5 mcg by mouth daily.    . cinacalcet (SENSIPAR) 60 MG tablet Take 60 mg by mouth daily. Every other day    . cromolyn (OPTICROM) 4 % ophthalmic solution 1 drop 4 (four) times daily.    Marland Kitchen HUMULIN R 500 UNIT/ML injection Inject under skin up to 200 units in the morning and up to 150 units in the evening 20 mL 11  . HUMULIN R U-500 KWIKPEN 500 UNIT/ML kwikpen INJECT 180 UNITS SUBQ AT BEGINNING OF BREAKFAST AND 140 UNITS AT BEGINNING OF EVENING MEAL (30 MINUTES BEFORE MEALS) 16 mL 11  . Insulin Pen Needle 32G X 4 MM MISC Use 2x a day 200 each 3  . lanthanum (FOSRENOL) 1000 MG chewable tablet Chew 1,000 mg by mouth 3 (three) times daily with meals.    . lidocaine-prilocaine (EMLA) cream USE ON ARAMS AS NEEDED FOR DIALYSIS  10  . Microlet Lancets MISC Use as directed to check blood sugars 4 times per day dx: e11.22 300 each 2  . MITIGARE 0.6 MG CAPS TAKE ONE CAPSULE BY MOUTH ONCE DAILY 90 capsule 0  . pregabalin (LYRICA) 100 MG capsule TAKE 1 CAPSULE BY MOUTH IN THE MORNING FOR NEUROPATHY 90 capsule 1  . pregabalin (LYRICA) 75 MG capsule TAKE ONE CAPSULE BY MOUTH AT BEDTIME 90 capsule 2  . RESTASIS 0.05 % ophthalmic emulsion INSTILL 1 DROP INTO BOTH EYES TWICE A DAY  3  . rosuvastatin (CRESTOR) 20 MG tablet TAKE 1 TABLET BY MOUTH EVERY DAY 90 tablet 3  . Semaglutide, 1 MG/DOSE, (OZEMPIC, 1 MG/DOSE,) 4 MG/3ML SOPN Inject 0.75 mLs (1 mg total) into the skin once a week. 9 mL 3   No current facility-administered medications on file prior to visit.   No Known Allergies Family History  Problem Relation Age of Onset  . Diabetes Mother   . Hypertension Mother   . Thyroid disease Mother   . Diabetes Father   . Hypertension Father   .  Cancer Father        STOMACH  .  Stomach cancer Father   . Stroke Maternal Grandmother   . Stroke Maternal Grandfather   . Colon cancer Neg Hx   . Rectal cancer Neg Hx    PE: BP 118/72 (BP Location: Right Arm, Patient Position: Sitting, Cuff Size: Large)   Pulse 85   Ht 5\' 2"  (1.575 m)   Wt 229 lb 9.6 oz (104.1 kg)   SpO2 97%   BMI 41.99 kg/m  Wt Readings from Last 3 Encounters:  07/30/20 229 lb 9.6 oz (104.1 kg)  03/05/20 230 lb (104.3 kg)  02/25/20 227 lb (103 kg)   Constitutional: overweight, in NAD Eyes: PERRLA, EOMI, no exophthalmos ENT: moist mucous membranes, no thyromegaly, no cervical lymphadenopathy Cardiovascular: RRR, No MRG Respiratory: CTA B Gastrointestinal: abdomen soft, NT, ND, BS+ Musculoskeletal: no deformities, strength intact in all 4 Skin: moist, warm, no rashes Neurological: no tremor with outstretched hands, DTR normal in all 4   ASSESSMENT: 1. DM2, insulin-dependent, uncontrolled, with complications - ESRD, on HD, awaiting kidney transplant - DR - PN  2. HL  3.  Obesity class III  PLAN:  1. Patient with longstanding, uncontrolled, type 2 diabetes, on injectable antidiabetic regimen with weekly GLP-1 receptor agonist and also concentrated insulin, with still poor control.  At our last in person visit, her HbA1c was 7.9%, improved.  Her HbA1c needs to be lower so that she can undergo renal transplant.  For now, she continues on dialysis.  The most recent HbA1c is from 05/26/2020 and this increase significantly, to 9.8%. -At last visit, sugars were significantly improved, without spikes in the 400s.  She still had some blood sugars in the 200s related to dietary indiscretions.  We discussed about using a higher dose of W119 insulin with certain meals.  Otherwise, we did not change the regimen at that time.  I did advise her to try to check some sugars after dialysis (she forgot). -At today's visit, sugars are still above target, but improved especially later in the day.  In the  morning, they are above target and even occasionally in the 200s depending on dietary indiscretions the night before.  She mentions that she moved her dinner before dialysis last visit.  I believe that her sugars improved after this change.  Since she is not checking sugars at bedtime, after dialysis, we discussed about starting to do so but I suspect that these are higher than goal.  We will increase her U500 insulin before dinner but I also advised her to check with nephrology to see if she can lower the glucose in her bath.  I am not sure if she is using a high or intermediate glucose right now. -We discussed that her hot flashes could be related to high glucose and may improve after the above change.  Of note, she had TAH + BSO approximately 20 years ago, so she is not nearly menopausal -We also discussed about possibly getting the CGM.  This would be a great addition to her regimen.  I sent the prescription for the freestyle libre to to her pharmacy. - I suggested to:  Patient Instructions  Please continue: - Ozempic 1 mg weekly  Change: - U500 insulin: - 200 units before breakfast - 160-170 units before dinner  Please check sugars 3 times a day, before meals and occasionally at bedtime.  Try to get the CGM.  Please return in 3 months.   - we  checked her HbA1c: 8.5% (improved) - advised to check sugars at different times of the day - 3x a day, rotating check times - advised for yearly eye exams >> she is UTD - return to clinic in 3 months  2. HL -Reviewed latest lipid panel from 07/2019: LDL above goal of less than 70, triglycerides high: Lab Results  Component Value Date   CHOL 192 08/07/2019   HDL 49 08/07/2019   LDLCALC 96 08/07/2019   TRIG 280 (H) 08/07/2019   CHOLHDL 3.9 08/07/2019  -Continues Crestor 20 mg daily without side effects -She is due for another lipid panel -has an appointment with PCP coming up next month   3.  Obesity class III -She saw weight management  in the past- but the meals were not suited for her 2/2 HD -Continues Ozempic, which should also help with weight loss -She lost 5 pounds before last visit -Weight stable since last visit  Philemon Kingdom, MD PhD [facility] Endocr[facility]

## 2020-07-30 NOTE — Addendum Note (Signed)
Addended by: Lauralyn Primes on: 07/30/2020 08:58 AM   Modules accepted: Orders

## 2020-07-30 NOTE — Patient Instructions (Addendum)
Please continue: - Ozempic 1 mg weekly  Change: - U500 insulin: - 200 units before breakfast - 160-170 units before dinner  Please check sugars 3 times a day, before meals and occasionally at bedtime.  Try to get the CGM.  Please return in 3 months.

## 2020-08-05 ENCOUNTER — Encounter: Payer: Self-pay | Admitting: Nurse Practitioner

## 2020-08-06 ENCOUNTER — Ambulatory Visit: Payer: Medicaid Other | Admitting: Nurse Practitioner

## 2020-08-08 DIAGNOSIS — J309 Allergic rhinitis, unspecified: Secondary | ICD-10-CM | POA: Diagnosis not present

## 2020-08-08 DIAGNOSIS — N186 End stage renal disease: Secondary | ICD-10-CM | POA: Diagnosis not present

## 2020-08-08 DIAGNOSIS — Z1152 Encounter for screening for COVID-19: Secondary | ICD-10-CM | POA: Diagnosis not present

## 2020-08-08 DIAGNOSIS — Z992 Dependence on renal dialysis: Secondary | ICD-10-CM | POA: Diagnosis not present

## 2020-08-12 ENCOUNTER — Telehealth: Payer: Self-pay

## 2020-08-12 ENCOUNTER — Encounter: Payer: BC Managed Care – PPO | Admitting: Internal Medicine

## 2020-08-12 DIAGNOSIS — E1122 Type 2 diabetes mellitus with diabetic chronic kidney disease: Secondary | ICD-10-CM

## 2020-08-12 NOTE — Telephone Encounter (Signed)
Pharmacy is needing a new Rx sent in they have never filled her medication before and is needing a new Rx on file to do so.    Please advise   [facility] - [place], [place] - [place] [address]   HUMULIN R U-500 KWIKPEN 500 UNIT/ML kwikpen  HUMULIN R 500 UNIT/ML injection

## 2020-08-13 NOTE — Telephone Encounter (Signed)
Pharmacy called back regarding the note below. They ask for a new prescription for pt's Humulin medications. Please send to:  [facility] - [place], [place] - [address] Phone:  [phone]  Fax:  [phone]

## 2020-08-17 DIAGNOSIS — G4733 Obstructive sleep apnea (adult) (pediatric): Secondary | ICD-10-CM | POA: Diagnosis not present

## 2020-08-18 MED ORDER — HUMULIN R U-500 KWIKPEN 500 UNIT/ML ~~LOC~~ SOPN: PEN_INJECTOR | SUBCUTANEOUS | 11 refills | Status: DC

## 2020-08-18 NOTE — Telephone Encounter (Signed)
Updated rx sent to preferred pharmacy

## 2020-08-22 ENCOUNTER — Other Ambulatory Visit: Payer: Self-pay | Admitting: Internal Medicine

## 2020-08-22 DIAGNOSIS — Z1231 Encounter for screening mammogram for malignant neoplasm of breast: Secondary | ICD-10-CM

## 2020-08-25 ENCOUNTER — Ambulatory Visit (INDEPENDENT_AMBULATORY_CARE_PROVIDER_SITE_OTHER): Payer: Managed Care, Other (non HMO) | Admitting: Internal Medicine

## 2020-08-25 ENCOUNTER — Telehealth: Payer: Self-pay | Admitting: Genetic Counselor

## 2020-08-25 ENCOUNTER — Other Ambulatory Visit: Payer: Self-pay

## 2020-08-25 ENCOUNTER — Encounter: Payer: Self-pay | Admitting: Internal Medicine

## 2020-08-25 ENCOUNTER — Other Ambulatory Visit (HOSPITAL_COMMUNITY): Payer: Self-pay

## 2020-08-25 VITALS — BP 114/80 | HR 80 | Temp 98.1°F | Ht 66.0 in | Wt 234.4 lb

## 2020-08-25 DIAGNOSIS — Z992 Dependence on renal dialysis: Secondary | ICD-10-CM | POA: Diagnosis not present

## 2020-08-25 DIAGNOSIS — N186 End stage renal disease: Secondary | ICD-10-CM | POA: Diagnosis not present

## 2020-08-25 DIAGNOSIS — Z6837 Body mass index (BMI) 37.0-37.9, adult: Secondary | ICD-10-CM | POA: Diagnosis not present

## 2020-08-25 DIAGNOSIS — L659 Nonscarring hair loss, unspecified: Secondary | ICD-10-CM | POA: Diagnosis not present

## 2020-08-25 DIAGNOSIS — Z0000 Encounter for general adult medical examination without abnormal findings: Secondary | ICD-10-CM | POA: Diagnosis not present

## 2020-08-25 DIAGNOSIS — Z794 Long term (current) use of insulin: Secondary | ICD-10-CM

## 2020-08-25 DIAGNOSIS — E1122 Type 2 diabetes mellitus with diabetic chronic kidney disease: Secondary | ICD-10-CM

## 2020-08-25 DIAGNOSIS — Z809 Family history of malignant neoplasm, unspecified: Secondary | ICD-10-CM | POA: Diagnosis not present

## 2020-08-25 DIAGNOSIS — I120 Hypertensive chronic kidney disease with stage 5 chronic kidney disease or end stage renal disease: Secondary | ICD-10-CM | POA: Diagnosis not present

## 2020-08-25 LAB — POCT URINALYSIS DIPSTICK
Bilirubin, UA: NEGATIVE
Glucose, UA: POSITIVE — AB
Ketones, UA: NEGATIVE
Leukocytes, UA: NEGATIVE
Nitrite, UA: NEGATIVE
Protein, UA: POSITIVE — AB
Spec Grav, UA: 1.02 (ref 1.010–1.025)
Urobilinogen, UA: 0.2 E.U./dL
pH, UA: 6 (ref 5.0–8.0)

## 2020-08-25 LAB — POCT UA - MICROALBUMIN
Albumin/Creatinine Ratio, Urine, POC: 300
Creatinine, POC: 200 mg/dL
Microalbumin Ur, POC: 150 mg/L

## 2020-08-25 MED ORDER — WEGOVY 1.7 MG/0.75ML ~~LOC~~ SOAJ
1.7000 mg | SUBCUTANEOUS | 0 refills | Status: DC
  Filled 2020-08-25: qty 3, 28d supply, fill #0

## 2020-08-25 NOTE — Progress Notes (Signed)
I,Katawbba Wiggins,acting as a Education administrator for Cassandra Greenland, MD.,have documented all relevant documentation on the behalf of Cassandra Greenland, MD,as directed by  Cassandra Greenland, MD while in the presence of Cassandra Greenland, MD.  This visit occurred during the SARS-CoV-2 public health emergency.  Safety protocols were in place, including screening questions prior to the visit, additional usage of staff PPE, and extensive cleaning of exam room while observing appropriate contact time as indicated for disinfecting solutions.  Subjective:     Patient ID: Cassandra Campos , female    DOB: 06-19-1970 , 50 y.o.   MRN: [id number]   Chief Complaint  Patient presents with  . Annual Exam  . Diabetes  . Hypertension    HPI  She is here today for a full physical examination. She is followed by Marny Lowenstein, NP  for her GYN exams, her next appt is scheduled for 09/01/2020.   Diabetes She presents for her follow-up diabetic visit. She has type 2 diabetes mellitus. Her disease course has been stable. There are no hypoglycemic associated symptoms. Pertinent negatives for diabetes include no blurred vision and no chest pain. There are no hypoglycemic complications. Diabetic complications include nephropathy, peripheral neuropathy and retinopathy. Risk factors for coronary artery disease include dyslipidemia, hypertension, diabetes mellitus, post-menopausal, sedentary lifestyle and obesity. She is compliant with treatment some of the time. She is following a diabetic diet. She has not had a previous visit with a dietitian. She participates in exercise intermittently. Her home blood glucose trend is fluctuating minimally. Her breakfast blood glucose is taken between 9-10 am. Her breakfast blood glucose range is generally 140-180 mg/dl. Eye exam is current.  Hypertension This is a chronic problem. The current episode started more than 1 year ago. The problem has been gradually improving since onset. The  problem is controlled. Pertinent negatives include no blurred vision, chest pain or shortness of breath. Palpitations: she c/o palpitations. recurrent. no associated chest pain. random. occurs at rest.  The current treatment provides moderate improvement. Hypertensive end-organ damage includes retinopathy. Identifiable causes of hypertension include renovascular disease.     Past Medical History:  Diagnosis Date  . Anemia associated with chronic renal failure   . ASCUS of cervix with negative high risk HPV 06/2017  . Back pain   . Chest pain   . CHF (congestive heart failure) ([place])   . CKD (chronic kidney disease), stage IV ([place]) no dialysis yet   nephrologist-  dr Jamal Maes-- scheduled next visit 09-08-2017 (lov 07-05-2017)  . Constipation   . Diabetic retinopathy ([place])   . Dialysis patient ([place])   . Edema, lower extremity   . Elevated transaminase level   . Fatty liver    FOLLOWED BY DR Henrene Pastor  . GERD (gastroesophageal reflux disease)   . Hypertension   . Idiopathic gout of multiple sites    09-05-2017 per pt stable  . Insulin dependent type 2 diabetes mellitus ([place])    followed by dr Toy Care  . Joint pain   . Mixed hyperlipidemia   . OSA (obstructive sleep apnea)    per study 10-20-2015 mild osa w/ AHI 10.3/hr;  09-05-2017 per pt has not used cpap since 1 yr ago  . Palpitations   . Peripheral neuropathy    feet  . S/P arteriovenous (AV) fistula creation 07-04-2015  w/ revision 02-14-2017   left braniocephilic  . Shortness of breath   . Trigger thumb, right thumb   . Umbilical hernia   .  Vitamin D deficiency      Family History  Problem Relation Age of Onset  . Diabetes Mother   . Hypertension Mother   . Thyroid disease Mother   . Diabetes Father   . Hypertension Father   . Cancer Father        STOMACH  . Stomach cancer Father   . Stroke Maternal Grandmother   . Stroke Maternal Grandfather   . Colon cancer Neg Hx   . Rectal cancer Neg Hx      Current  Outpatient Medications:  .  allopurinol (ZYLOPRIM) 300 MG tablet, TAKE 1 TABLET BY MOUTH EVERY MORNING, Disp: 90 tablet, Rfl: 1 .  B Complex-C-Folic Acid (RENAL) 1 MG CAPS, daily at 2 am., Disp: , Rfl:  .  cinacalcet (SENSIPAR) 60 MG tablet, Take 60 mg by mouth daily. Every other day, Disp: , Rfl:  .  Continuous Blood Gluc Receiver (FREESTYLE LIBRE 2 READER) DEVI, 1 each by Does not apply route daily., Disp: 1 each, Rfl: 0 .  Continuous Blood Gluc Sensor (FREESTYLE LIBRE 2 SENSOR) MISC, 1 each by Does not apply route every 14 (fourteen) days., Disp: 6 each, Rfl: 3 .  lanthanum (FOSRENOL) 1000 MG chewable tablet, Chew 1,000 mg by mouth 3 (three) times daily with meals., Disp: , Rfl:  .  lidocaine-prilocaine (EMLA) cream, USE ON ARAMS AS NEEDED FOR DIALYSIS, Disp: , Rfl: 10 .  MITIGARE 0.6 MG CAPS, TAKE ONE CAPSULE BY MOUTH ONCE DAILY (Patient taking differently: Monday, Wednesday, and Friday), Disp: 90 capsule, Rfl: 0 .  RESTASIS 0.05 % ophthalmic emulsion, INSTILL 1 DROP INTO BOTH EYES TWICE A DAY, Disp: , Rfl: 3 .  rosuvastatin (CRESTOR) 20 MG tablet, TAKE 1 TABLET BY MOUTH EVERY DAY, Disp: 90 tablet, Rfl: 3 .  Semaglutide, 1 MG/DOSE, (OZEMPIC, 1 MG/DOSE,) 4 MG/3ML SOPN, Inject 0.75 mLs (1 mg total) into the skin once a week., Disp: 9 mL, Rfl: 3 .  Semaglutide-Weight Management (WEGOVY) 1.7 MG/0.75ML SOAJ, Inject 1.7 mg into the skin once a week. (Patient not taking: Reported on 09/01/2020), Disp: 3 mL, Rfl: 0 .  Armodafinil 150 MG tablet, Take 1 tablet (150 mg total) by mouth daily., Disp: 30 tablet, Rfl: 5 .  calcitRIOL (ROCALTROL) 0.5 MCG capsule, Take 0.5 mcg by mouth daily., Disp: , Rfl:  .  cromolyn (OPTICROM) 4 % ophthalmic solution, 1 drop 4 (four) times daily. (Patient not taking: No sig reported), Disp: , Rfl:  .  Insulin Pen Needle 32G X 4 MM MISC, Use 2x a day, Disp: 200 each, Rfl: 3 .  insulin regular human CONCENTRATED (HUMULIN R U-500 KWIKPEN) 500 UNIT/ML kwikpen, Inject 200 units  before breakfast 160-170 units before dinner, Disp: 16 mL, Rfl: 11 .  metroNIDAZOLE (FLAGYL) 500 MG tablet, Take 1 tablet (500 mg total) by mouth 2 (two) times daily., Disp: 14 tablet, Rfl: 0 .  Microlet Lancets MISC, Use as directed to check blood sugars 4 times per day dx: e11.22, Disp: 300 each, Rfl: 2 .  pregabalin (LYRICA) 100 MG capsule, Take 1 capsule (100 mg total) by mouth daily. In the morning, Disp: 90 capsule, Rfl: 1 .  pregabalin (LYRICA) 75 MG capsule, Take 1 capsule (75 mg total) by mouth daily. At bedtime, Disp: 90 capsule, Rfl: 1   No Known Allergies    The patient states she uses status post hysterectomy for birth control. Last LMP was No LMP recorded. Patient has had a hysterectomy.. Negative for Dysmenorrhea. Negative for: breast discharge,  breast lump(s), breast pain and breast self exam. Associated symptoms include abnormal vaginal bleeding. Pertinent negatives include abnormal bleeding (hematology), anxiety, decreased libido, depression, difficulty falling sleep, dyspareunia, history of infertility, nocturia, sexual dysfunction, sleep disturbances, urinary incontinence, urinary urgency, vaginal discharge and vaginal itching. Diet regular.The patient states her exercise level is  intermittent.  . The patient's tobacco use is:  Social History   Tobacco Use  Smoking Status Never Smoker  Smokeless Tobacco Never Used  . She has been exposed to passive smoke. The patient's alcohol use is:  Social History   Substance and Sexual Activity  Alcohol Use Yes  . Alcohol/week: 0.0 standard drinks   Comment: Very rare   Review of Systems  Constitutional: Negative.   HENT: Negative.   Eyes: Negative.  Negative for blurred vision.  Respiratory: Negative.  Negative for shortness of breath.   Cardiovascular: Negative.  Negative for chest pain. Palpitations: she c/o palpitations. recurrent. no associated chest pain. random. occurs at rest.   Endocrine: Negative.   Genitourinary:  Negative.   Musculoskeletal: Negative.   Skin: Negative.   Allergic/Immunologic: Negative.   Neurological: Negative.   Hematological: Negative.   Psychiatric/Behavioral: Negative.      Today's Vitals   08/25/20 1436  BP: 114/80  Pulse: 80  Temp: 98.1 F (36.7 C)  TempSrc: Oral  Weight: 234 lb 6.4 oz (106.3 kg)  Height: '5\' 6"'  (1.676 m)   Body mass index is 37.83 kg/m.  Wt Readings from Last 3 Encounters:  09/01/20 232 lb (105.2 kg)  08/25/20 234 lb 6.4 oz (106.3 kg)  07/30/20 229 lb 9.6 oz (104.1 kg)   Objective:  Physical Exam Vitals and nursing note reviewed.  Constitutional:      Appearance: Normal appearance. She is obese.  HENT:     Head: Normocephalic and atraumatic.     Right Ear: Tympanic membrane, ear canal and external ear normal.     Left Ear: Tympanic membrane, ear canal and external ear normal.     Nose:     Comments: Masked     Mouth/Throat:     Comments: Masked  Eyes:     Extraocular Movements: Extraocular movements intact.     Conjunctiva/sclera: Conjunctivae normal.     Pupils: Pupils are equal, round, and reactive to light.  Cardiovascular:     Rate and Rhythm: Normal rate and regular rhythm.     Pulses: Normal pulses.          Dorsalis pedis pulses are 2+ on the right side and 2+ on the left side.     Heart sounds: Normal heart sounds.  Pulmonary:     Effort: Pulmonary effort is normal.     Breath sounds: Normal breath sounds.  Chest:  Breasts:     Tanner Score is 5.     Right: Normal.    Abdominal:     General: Bowel sounds are normal.     Palpations: Abdomen is soft.     Comments: Obese, soft, healed scars  Genitourinary:    Comments: deferred Musculoskeletal:        General: Normal range of motion.     Cervical back: Normal range of motion and neck supple.  Feet:     Right foot:     Protective Sensation: 5 sites tested. 5 sites sensed.     Skin integrity: Dry skin present.     Toenail Condition: Right toenails are normal.      Left foot:     Protective  Sensation: 5 sites tested. 5 sites sensed.     Skin integrity: Dry skin present.     Toenail Condition: Left toenails are normal.  Skin:    General: Skin is warm and dry.     Comments: There is some hair loss around her hairline. No rash noted.   Neurological:     General: No focal deficit present.     Mental Status: She is alert and oriented to person, place, and time.  Psychiatric:        Mood and Affect: Mood normal.        Behavior: Behavior normal.         Assessment And Plan:     1. Routine general medical examination at health care facility Comments: A full exam was performed. Importance of monthly self breast exams was discussed with the patient. PATIENT IS ADVISED TO GET 30-45 MINUTES REGULAR EXERCISE NO LESS THAN FOUR TO FIVE DAYS PER WEEK - BOTH WEIGHTBEARING EXERCISES AND AEROBIC ARE RECOMMENDED.  PATIENT IS ADVISED TO FOLLOW A HEALTHY DIET WITH AT LEAST SIX FRUITS/VEGGIES PER DAY, DECREASE INTAKE OF RED MEAT, AND TO INCREASE FISH INTAKE TO TWO DAYS PER WEEK.  MEATS/FISH SHOULD NOT BE FRIED, BAKED OR BROILED IS PREFERABLE.  IT IS ALSO IMPORTANT TO CUT BACK ON YOUR SUGAR INTAKE. PLEASE AVOID ANYTHING WITH ADDED SUGAR, CORN SYRUP OR OTHER SWEETENERS. IF YOU MUST USE A SWEETENER, YOU CAN TRY STEVIA. IT IS ALSO IMPORTANT TO AVOID ARTIFICIALLY SWEETENERS AND DIET BEVERAGES. LASTLY, I SUGGEST WEARING SPF 50 SUNSCREEN ON EXPOSED PARTS AND ESPECIALLY WHEN IN THE DIRECT SUNLIGHT FOR AN EXTENDED PERIOD OF TIME.  PLEASE AVOID FAST FOOD RESTAURANTS AND INCREASE YOUR WATER INTAKE.  - CBC - CMP14+EGFR - Lipid panel  2. Type 2 diabetes mellitus with chronic kidney disease on chronic dialysis, with long-term current use of insulin ([facility]) Comments: Diabetic foot exam was performed.  I DISCUSSED WITH THE PATIENT AT LENGTH REGARDING THE GOALS OF GLYCEMIC CONTROL AND POSSIBLE LONG-TERM COMPLICATIONS.  I  ALSO STRESSED THE IMPORTANCE OF COMPLIANCE WITH HOME GLUCOSE  MONITORING, DIETARY RESTRICTIONS INCLUDING AVOIDANCE OF SUGARY DRINKS/PROCESSED FOODS,  ALONG WITH REGULAR EXERCISE.  I  ALSO STRESSED THE IMPORTANCE OF ANNUAL EYE EXAMS, SELF FOOT CARE AND COMPLIANCE WITH OFFICE VISITS.  - POCT Urinalysis Dipstick (81002) - POCT UA - Microalbumin  3. 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 She will c/w meds. EKG performed, NSR w/ ow voltage with rightward P-axis /rotation -possible pulm. disease. Advised to follow low sodium diet. - EKG 12-Lead  4. ESRD on hemodialysis [facility]) Comments: She performs HD at home, MT/ThF.   5. 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 I will check labs as listed below. I will also refer her to [facility] as requested.  - ANA, IFA (with reflex) - TSH - Ambulatory referral to Dermatology - Ferritin  6. Class 2 severe obesity due to excess calories with serious comorbidity and body mass index (BMI) of 37.0 to 37.9 in adult [facility]) Comments: She is encouraged to strive for BMi less than 30 to decrease cardiac risk. ADvised to aim for at least 150 minutes of exercise per week. She agrees to referral to [facility], she must achieve significant weight loss in order to proceed with kidney transplant.  - Ambulatory referral to Internal Medicine  7. Family history of cancer Comments: She requests genetics evaluation, referral placed.  - Ambulatory  referral to Genetics  Patient was given opportunity to ask questions. Patient verbalized understanding of the plan and was able to repeat key elements of the plan. All questions were answered to their satisfaction.   I, Cassandra Greenland, MD, have reviewed all documentation for this visit. The documentation on 08/25/20 for the exam, diagnosis, procedures, and orders are all accurate and complete.  THE PATIENT IS ENCOURAGED TO PRACTICE SOCIAL DISTANCING DUE TO THE COVID-19 PANDEMIC.

## 2020-08-25 NOTE — Patient Instructions (Signed)
Health Maintenance, Female Adopting a healthy lifestyle and getting preventive care are important in promoting health and wellness. Ask your health care provider about:  The right schedule for you to have regular tests and exams.  Things you can do on your own to prevent diseases and keep yourself healthy. What should I know about diet, weight, and exercise? Eat a healthy diet  Eat a diet that includes plenty of vegetables, fruits, low-fat dairy products, and lean protein.  Do not eat a lot of foods that are high in solid fats, added sugars, or sodium.   Maintain a healthy weight Body mass index (BMI) is used to identify weight problems. It estimates body fat based on height and weight. Your health care provider can help determine your BMI and help you achieve or maintain a healthy weight. Get regular exercise Get regular exercise. This is one of the most important things you can do for your health. Most adults should:  Exercise for at least 150 minutes each week. The exercise should increase your heart rate and make you sweat (moderate-intensity exercise).  Do strengthening exercises at least twice a week. This is in addition to the moderate-intensity exercise.  Spend less time sitting. Even light physical activity can be beneficial. Watch cholesterol and blood lipids Have your blood tested for lipids and cholesterol at 49 years of age, then have this test every 5 years. Have your cholesterol levels checked more often if:  Your lipid or cholesterol levels are high.  You are older than 49 years of age.  You are at high risk for heart disease. What should I know about cancer screening? Depending on your health history and family history, you may need to have cancer screening at various ages. This may include screening for:  Breast cancer.  Cervical cancer.  Colorectal cancer.  Skin cancer.  Lung cancer. What should I know about heart disease, diabetes, and high blood  pressure? Blood pressure and heart disease  High blood pressure causes heart disease and increases the risk of stroke. This is more likely to develop in people who have high blood pressure readings, are of African descent, or are overweight.  Have your blood pressure checked: ? Every 3-5 years if you are 18-39 years of age. ? Every year if you are 40 years old or older. Diabetes Have regular diabetes screenings. This checks your fasting blood sugar level. Have the screening done:  Once every three years after age 40 if you are at a normal weight and have a low risk for diabetes.  More often and at a younger age if you are overweight or have a high risk for diabetes. What should I know about preventing infection? Hepatitis B If you have a higher risk for hepatitis B, you should be screened for this virus. Talk with your health care provider to find out if you are at risk for hepatitis B infection. Hepatitis C Testing is recommended for:  Everyone born from 1945 through 1965.  Anyone with known risk factors for hepatitis C. Sexually transmitted infections (STIs)  Get screened for STIs, including gonorrhea and chlamydia, if: ? You are sexually active and are younger than 49 years of age. ? You are older than 49 years of age and your health care provider tells you that you are at risk for this type of infection. ? Your sexual activity has changed since you were last screened, and you are at increased risk for chlamydia or gonorrhea. Ask your health care provider   if you are at risk.  Ask your health care provider about whether you are at high risk for HIV. Your health care provider may recommend a prescription medicine to help prevent HIV infection. If you choose to take medicine to prevent HIV, you should first get tested for HIV. You should then be tested every 3 months for as long as you are taking the medicine. Pregnancy  If you are about to stop having your period (premenopausal) and  you may become pregnant, seek counseling before you get pregnant.  Take 400 to 800 micrograms (mcg) of folic acid every day if you become pregnant.  Ask for birth control (contraception) if you want to prevent pregnancy. Osteoporosis and menopause Osteoporosis is a disease in which the bones lose minerals and strength with aging. This can result in bone fractures. If you are 65 years old or older, or if you are at risk for osteoporosis and fractures, ask your health care provider if you should:  Be screened for bone loss.  Take a calcium or vitamin D supplement to lower your risk of fractures.  Be given hormone replacement therapy (HRT) to treat symptoms of menopause. Follow these instructions at home: Lifestyle  Do not use any products that contain nicotine or tobacco, such as cigarettes, e-cigarettes, and chewing tobacco. If you need help quitting, ask your health care provider.  Do not use street drugs.  Do not share needles.  Ask your health care provider for help if you need support or information about quitting drugs. Alcohol use  Do not drink alcohol if: ? Your health care provider tells you not to drink. ? You are pregnant, may be pregnant, or are planning to become pregnant.  If you drink alcohol: ? Limit how much you use to 0-1 drink a day. ? Limit intake if you are breastfeeding.  Be aware of how much alcohol is in your drink. In the [place] one drink equals one 12 oz bottle of beer (355 mL), one 5 oz glass of wine (148 mL), or one 1 oz glass of hard liquor (44 mL). General instructions  Schedule regular health, dental, and eye exams.  Stay current with your vaccines.  Tell your health care provider if: ? You often feel depressed. ? You have ever been abused or do not feel safe at home. Summary  Adopting a healthy lifestyle and getting preventive care are important in promoting health and wellness.  Follow your health care provider's instructions about healthy  diet, exercising, and getting tested or screened for diseases.  Follow your health care provider's instructions on monitoring your cholesterol and blood pressure. This information is not intended to replace advice given to you by your health care provider. Make sure you discuss any questions you have with your health care provider. Document Revised: 04/26/2018 Document Reviewed: 04/26/2018 Elsevier Patient Education  2021 Elsevier Inc.  

## 2020-08-25 NOTE — Telephone Encounter (Signed)
Received a genetic counseling referral from Dr. Baird Cancer for a fhx of cancer. Cassandra Campos has been cld and scheduled to see Cassandra Campos on 5/3 at 9am per her request. Aware to arrive 15 minutes early.

## 2020-08-27 ENCOUNTER — Other Ambulatory Visit: Payer: Self-pay | Admitting: Internal Medicine

## 2020-08-27 LAB — LIPID PANEL
Chol/HDL Ratio: 4.7 ratio — ABNORMAL HIGH (ref 0.0–4.4)
Cholesterol, Total: 183 mg/dL (ref 100–199)
HDL: 39 mg/dL — ABNORMAL LOW (ref >39)
LDL Chol Calc (NIH): 63 mg/dL (ref 0–99)
Triglycerides: 536 mg/dL — ABNORMAL HIGH (ref 0–149)
VLDL Cholesterol Cal: 81 mg/dL — ABNORMAL HIGH (ref 5–40)

## 2020-08-27 LAB — CMP14+EGFR
ALT: 35 IU/L — ABNORMAL HIGH (ref 0–32)
AST: 23 IU/L (ref 0–40)
Albumin/Globulin Ratio: 1.5 (ref 1.2–2.2)
Albumin: 4.6 g/dL (ref 3.8–4.8)
Alkaline Phosphatase: 59 IU/L (ref 44–121)
BUN/Creatinine Ratio: 6 — ABNORMAL LOW (ref 9–23)
BUN: 60 mg/dL — ABNORMAL HIGH (ref 6–24)
Bilirubin Total: 0.4 mg/dL (ref 0.0–1.2)
CO2: 19 mmol/L — ABNORMAL LOW (ref 20–29)
Calcium: 10.6 mg/dL — ABNORMAL HIGH (ref 8.7–10.2)
Chloride: 99 mmol/L (ref 96–106)
Creatinine, Ser: 9.38 mg/dL — ABNORMAL HIGH (ref 0.57–1.00)
Globulin, Total: 3 g/dL (ref 1.5–4.5)
Glucose: 148 mg/dL — ABNORMAL HIGH (ref 65–99)
Potassium: 3.9 mmol/L (ref 3.5–5.2)
Sodium: 144 mmol/L (ref 134–144)
Total Protein: 7.6 g/dL (ref 6.0–8.5)
eGFR: 5 mL/min/{1.73_m2} — ABNORMAL LOW (ref >59)

## 2020-08-27 LAB — CBC
Hematocrit: 31.9 % — ABNORMAL LOW (ref 34.0–46.6)
Hemoglobin: 10.9 g/dL — ABNORMAL LOW (ref 11.1–15.9)
MCH: 32.2 pg (ref 26.6–33.0)
MCHC: 34.2 g/dL (ref 31.5–35.7)
MCV: 94 fL (ref 79–97)
Platelets: 167 10*3/uL (ref 150–450)
RBC: 3.39 x10E6/uL — ABNORMAL LOW (ref 3.77–5.28)
RDW: 14.2 % (ref 11.7–15.4)
WBC: 6.5 10*3/uL (ref 3.4–10.8)

## 2020-08-27 LAB — ANTINUCLEAR ANTIBODIES, IFA: ANA Titer 1: NEGATIVE

## 2020-08-27 LAB — FERRITIN: Ferritin: 1500 ng/mL — ABNORMAL HIGH (ref 15–150)

## 2020-08-27 LAB — TSH: TSH: 1.11 u[IU]/mL (ref 0.450–4.500)

## 2020-08-27 MED ORDER — PREGABALIN 75 MG PO CAPS: 75.0000 mg | ORAL_CAPSULE | Freq: Every day | ORAL | 1 refills | Status: DC

## 2020-08-27 MED ORDER — PREGABALIN 100 MG PO CAPS: 100.0000 mg | ORAL_CAPSULE | Freq: Every day | ORAL | 1 refills | Status: DC

## 2020-09-01 ENCOUNTER — Encounter: Payer: Self-pay | Admitting: Nurse Practitioner

## 2020-09-01 ENCOUNTER — Ambulatory Visit (INDEPENDENT_AMBULATORY_CARE_PROVIDER_SITE_OTHER): Payer: Managed Care, Other (non HMO) | Admitting: Nurse Practitioner

## 2020-09-01 ENCOUNTER — Other Ambulatory Visit: Payer: Self-pay

## 2020-09-01 VITALS — BP 124/82 | Ht 63.0 in | Wt 232.0 lb

## 2020-09-01 DIAGNOSIS — Z90722 Acquired absence of ovaries, bilateral: Secondary | ICD-10-CM

## 2020-09-01 DIAGNOSIS — Z01419 Encounter for gynecological examination (general) (routine) without abnormal findings: Secondary | ICD-10-CM | POA: Diagnosis not present

## 2020-09-01 DIAGNOSIS — Z780 Asymptomatic menopausal state: Secondary | ICD-10-CM | POA: Diagnosis not present

## 2020-09-01 DIAGNOSIS — Z90710 Acquired absence of both cervix and uterus: Secondary | ICD-10-CM | POA: Diagnosis not present

## 2020-09-01 DIAGNOSIS — Z13820 Encounter for screening for osteoporosis: Secondary | ICD-10-CM | POA: Diagnosis not present

## 2020-09-01 DIAGNOSIS — Z9079 Acquired absence of other genital organ(s): Secondary | ICD-10-CM

## 2020-09-01 DIAGNOSIS — N898 Other specified noninflammatory disorders of vagina: Secondary | ICD-10-CM

## 2020-09-01 DIAGNOSIS — B9689 Other specified bacterial agents as the cause of diseases classified elsewhere: Secondary | ICD-10-CM

## 2020-09-01 DIAGNOSIS — N760 Acute vaginitis: Secondary | ICD-10-CM

## 2020-09-01 LAB — WET PREP FOR TRICH, YEAST, CLUE

## 2020-09-01 MED ORDER — METRONIDAZOLE 500 MG PO TABS: 500.0000 mg | ORAL_TABLET | Freq: Two times a day (BID) | ORAL | 0 refills | Status: DC

## 2020-09-01 NOTE — Progress Notes (Signed)
   Cassandra Campos 28-Jun-1970 [id number]   History:  50 y.o. [id number] presents for annual exam. She complains of vaginal itching and irritation that started a few days ago. Postmenopausal - no longer on HRT, occasional hot flashes. 2002 TAH with subsequent BSO in 2013. Normal pap and mammogram history. T2DM managed by PCP. Maternal aunt and multiple cousins with breast cancer. Patient has appointment scheduled for genetic testing.   Gynecologic History No LMP recorded. Patient has had a hysterectomy.   Contraception/Family planning: status post hysterectomy  Health Maintenance Last Pap: 07/13/2018. Results were: normal Last mammogram: 07/25/2019. Results were: normal  Last colonoscopy: 2018. Results were: normal, 10-year recall Last Dexa: years ago per patient, prior to menopause  Past medical history, past surgical history, family history and social history were all reviewed and documented in the EPIC chart. Works for [employer]. Son and daughter. Married.   ROS:  A ROS was performed and pertinent positives and negatives are included.  Exam:  Vitals:   09/01/20 0830  BP: 124/82  Weight: 232 lb (105.2 kg)  Height: 5\' 3"  (1.6 m)   Body mass index is 41.1 kg/m.  General appearance:  Normal Thyroid:  Symmetrical, normal in size, without palpable masses or nodularity. Respiratory  Auscultation:  Clear without wheezing or rhonchi Cardiovascular  Auscultation:  Regular rate, without rubs, murmurs or gallops  Edema/varicosities:  Not grossly evident Abdominal  Soft,nontender, without masses, guarding or rebound.  Liver/spleen:  No organomegaly noted  Hernia:  None appreciated  Skin  Inspection:  Grossly normal Breasts: Examined lying and sitting.   Right: Without masses, retractions, nipple discharge or axillary adenopathy.   Left: Without masses, retractions, nipple discharge or axillary adenopathy. Gentitourinary   Inguinal/mons:  Normal  without inguinal adenopathy  External genitalia:  Normal appearing vulva with no masses, tenderness, or lesions  BUS/Urethra/Skene's glands:  Normal  Vagina:  Normal appearing with normal color, moderate discharge, no lesions  Cervix:  Absent  Uterus:  Absent  Adnexa/parametria:     Rt: Normal in size, without masses or tenderness.   Lt: Normal in size, without masses or tenderness.  Anus and perineum: Normal  Digital rectal exam: Normal sphincter tone without palpated masses or tenderness  Wet prep + clue cells  Assessment/Plan:  50 y.o. [id number] for annual exam.   Well female exam with routine gynecological exam - Education provided on SBEs, importance of preventative screenings, current guidelines, high calcium diet, regular exercise, and multivitamin daily. Labs with PCP.   History of total abdominal hysterectomy and bilateral salpingo-oophorectomy - 2002 TAH, 2013 BSO - Plan: DG Bone Density  Screening for osteoporosis - Plan: DG Bone Density  Menopause - Plan: DG Bone Density, no longer on HRT.   Vaginal itching - Plan: WET PREP FOR TRICH, YEAST, CLUE  Bacterial vaginosis - Plan: metroNIDAZOLE (FLAGYL) 500 MG tablet twice daily x 7 days.   Screening for cervical cancer - Normal Pap history. No longer screening per guidelines.   Screening for breast cancer - Normal mammogram history.  Continue annual screenings.  Normal breast exam today. Mammogram scheduled in June. Maternal aunt and multiple cousins with breast cancer. Patient has appointment scheduled for genetic testing.   Screening for colon cancer - 2018 colonoscopy. Will repeat at GI's recommended interval.   Return in 1 year for annual.  Tamela Gammon DNP, 8:49 AM 09/01/2020

## 2020-09-01 NOTE — Patient Instructions (Signed)

## 2020-09-03 ENCOUNTER — Other Ambulatory Visit: Payer: Self-pay

## 2020-09-03 ENCOUNTER — Ambulatory Visit (INDEPENDENT_AMBULATORY_CARE_PROVIDER_SITE_OTHER): Payer: Managed Care, Other (non HMO) | Admitting: Nurse Practitioner

## 2020-09-03 VITALS — BP 118/60 | HR 100 | Temp 98.8°F | Ht 63.4 in | Wt 229.8 lb

## 2020-09-03 DIAGNOSIS — R21 Rash and other nonspecific skin eruption: Secondary | ICD-10-CM | POA: Diagnosis not present

## 2020-09-03 MED ORDER — TRIAMCINOLONE ACETONIDE 0.5 % EX OINT: 1.0000 "application " | TOPICAL_OINTMENT | Freq: Two times a day (BID) | CUTANEOUS | 1 refills | Status: DC

## 2020-09-03 MED ORDER — TRIAMCINOLONE ACETONIDE 40 MG/ML IJ SUSP
40.0000 mg | Freq: Once | INTRAMUSCULAR | Status: AC
  Administered 2020-09-03: 40 mg via INTRAMUSCULAR

## 2020-09-03 NOTE — Progress Notes (Signed)
I,Tianna Badgett,acting as a Education administrator for [employer], NP.,have documented all relevant documentation on the behalf of [employer], NP,as directed by  Bary Castilla, NP while in the presence of Bary Castilla, NP.  This visit occurred during the SARS-CoV-2 public health emergency.  Safety protocols were in place, including screening questions prior to the visit, additional usage of staff PPE, and extensive cleaning of exam room while observing appropriate contact time as indicated for disinfecting solutions.  Subjective:     Patient ID: Cassandra Campos , female    DOB: [birth year] , 50 y.o.   MRN: [id number]   Chief Complaint  Patient presents with  . Rash    HPI  Patient is here for rash. She noticed this on saturday. On Friday when she performed her dialysis she noticed that one of the bags were discolored but she did not think anything of it. Also, she also usually uses "sacks" but on Friday she only had bags. She is not sure if the rash came from the that or not. She has not changed anything in her diet, detergent, soups or lotions. She hasn't been outside as much either. She has been taking benadryl at home with some relief. The rash is on her chest, on her arms and her back. She reports no SOB or chest pain.   Rash This is a new problem. The current episode started in the past 7 days. The problem has been gradually worsening since onset. The affected locations include the chest, neck, back, torso, right arm, left arm, right upper leg and left upper leg. The rash is characterized by dryness, redness and itchiness. It is unknown if there was an exposure to a precipitant. Pertinent negatives include no congestion, diarrhea, fever, shortness of breath or vomiting. Past treatments include antihistamine. The treatment provided no relief.     Past Medical History:  Diagnosis Date  . Anemia associated with chronic renal failure   . ASCUS of cervix with negative high  risk HPV 06/2017  . Back pain   . Chest pain   . CHF (congestive heart failure) ([place])   . CKD (chronic kidney disease), stage IV ([place]) no dialysis yet   nephrologist-  dr Jamal Maes-- scheduled next visit 09-08-2017 (lov 07-05-2017)  . Constipation   . Diabetic retinopathy ([place])   . Dialysis patient ([place])   . Edema, lower extremity   . Elevated transaminase level   . Fatty liver    FOLLOWED BY DR Henrene Pastor  . GERD (gastroesophageal reflux disease)   . Hypertension   . Idiopathic gout of multiple sites    09-05-2017 per pt stable  . Insulin dependent type 2 diabetes mellitus ([place])    followed by dr Toy Care  . Joint pain   . Mixed hyperlipidemia   . OSA (obstructive sleep apnea)    per study 10-20-2015 mild osa w/ AHI 10.3/hr;  09-05-2017 per pt has not used cpap since 1 yr ago  . Palpitations   . Peripheral neuropathy    feet  . S/P arteriovenous (AV) fistula creation 07-04-2015  w/ revision 02-14-2017   left braniocephilic  . Shortness of breath   . Trigger thumb, right thumb   . Umbilical hernia   . Vitamin D deficiency      Family History  Problem Relation Age of Onset  . Diabetes Mother   . Hypertension Mother   . Thyroid disease Mother   . Diabetes Father   . Hypertension Father   . Cancer Father  STOMACH  . Stomach cancer Father   . Stroke Maternal Grandmother   . Stroke Maternal Grandfather   . Colon cancer Neg Hx   . Rectal cancer Neg Hx      Current Outpatient Medications:  .  triamcinolone ointment (KENALOG) 0.5 %, Apply 1 application topically 2 (two) times daily., Disp: 30 g, Rfl: 1 .  allopurinol (ZYLOPRIM) 300 MG tablet, TAKE 1 TABLET BY MOUTH EVERY MORNING, Disp: 90 tablet, Rfl: 1 .  Armodafinil 150 MG tablet, Take 1 tablet (150 mg total) by mouth daily., Disp: 30 tablet, Rfl: 5 .  B Complex-C-Folic Acid (RENAL) 1 MG CAPS, daily at 2 am., Disp: , Rfl:  .  calcitRIOL (ROCALTROL) 0.5 MCG capsule, Take 0.5 mcg by mouth daily., Disp: , Rfl:  .   cinacalcet (SENSIPAR) 60 MG tablet, Take 60 mg by mouth daily. Every other day, Disp: , Rfl:  .  Continuous Blood Gluc Receiver (FREESTYLE LIBRE 2 READER) DEVI, 1 each by Does not apply route daily., Disp: 1 each, Rfl: 0 .  Continuous Blood Gluc Sensor (FREESTYLE LIBRE 2 SENSOR) MISC, 1 each by Does not apply route every 14 (fourteen) days., Disp: 6 each, Rfl: 3 .  cromolyn (OPTICROM) 4 % ophthalmic solution, 1 drop 4 (four) times daily. (Patient not taking: No sig reported), Disp: , Rfl:  .  Insulin Pen Needle 32G X 4 MM MISC, Use 2x a day, Disp: 200 each, Rfl: 3 .  insulin regular human CONCENTRATED (HUMULIN R U-500 KWIKPEN) 500 UNIT/ML kwikpen, Inject 200 units before breakfast 160-170 units before dinner, Disp: 16 mL, Rfl: 11 .  lanthanum (FOSRENOL) 1000 MG chewable tablet, Chew 1,000 mg by mouth 3 (three) times daily with meals., Disp: , Rfl:  .  lidocaine-prilocaine (EMLA) cream, USE ON ARAMS AS NEEDED FOR DIALYSIS, Disp: , Rfl: 10 .  metroNIDAZOLE (FLAGYL) 500 MG tablet, Take 1 tablet (500 mg total) by mouth 2 (two) times daily., Disp: 14 tablet, Rfl: 0 .  Microlet Lancets MISC, Use as directed to check blood sugars 4 times per day dx: e11.22, Disp: 300 each, Rfl: 2 .  MITIGARE 0.6 MG CAPS, TAKE ONE CAPSULE BY MOUTH ONCE DAILY (Patient taking differently: Monday, Wednesday, and Friday), Disp: 90 capsule, Rfl: 0 .  pregabalin (LYRICA) 100 MG capsule, Take 1 capsule (100 mg total) by mouth daily. In the morning, Disp: 90 capsule, Rfl: 1 .  pregabalin (LYRICA) 75 MG capsule, Take 1 capsule (75 mg total) by mouth daily. At bedtime, Disp: 90 capsule, Rfl: 1 .  RESTASIS 0.05 % ophthalmic emulsion, INSTILL 1 DROP INTO BOTH EYES TWICE A DAY, Disp: , Rfl: 3 .  rosuvastatin (CRESTOR) 20 MG tablet, TAKE 1 TABLET BY MOUTH EVERY DAY, Disp: 90 tablet, Rfl: 3 .  Semaglutide, 1 MG/DOSE, (OZEMPIC, 1 MG/DOSE,) 4 MG/3ML SOPN, Inject 0.75 mLs (1 mg total) into the skin once a week., Disp: 9 mL, Rfl: 3 .   Semaglutide-Weight Management (WEGOVY) 1.7 MG/0.75ML SOAJ, Inject 1.7 mg into the skin once a week. (Patient not taking: Reported on 09/01/2020), Disp: 3 mL, Rfl: 0   No Known Allergies   Review of Systems  Constitutional: Negative.  Negative for fever.  HENT: Negative for congestion.   Respiratory: Negative.  Negative for choking, shortness of breath and wheezing.   Cardiovascular: Negative.  Negative for chest pain and palpitations.  Gastrointestinal: Negative.  Negative for constipation, diarrhea, nausea and vomiting.  Skin: Positive for rash.  Neurological: Negative.  Today's Vitals   09/03/20 1020  BP: 118/60  Pulse: 100  Temp: 98.8 F (37.1 C)  TempSrc: Oral  Weight: 229 lb 12.8 oz (104.2 kg)  Height: 5' 3.4" (1.61 m)   Body mass index is 40.2 kg/m.   Objective:  Physical Exam Constitutional:      Appearance: Normal appearance. She is obese.  HENT:     Head: Normocephalic and atraumatic.  Cardiovascular:     Rate and Rhythm: Normal rate and regular rhythm.     Pulses: Normal pulses.  Pulmonary:     Effort: Pulmonary effort is normal. No respiratory distress.     Breath sounds: Normal breath sounds. No wheezing.  Skin:    General: Skin is warm and dry.     Capillary Refill: Capillary refill takes less than 2 seconds.     Findings: Rash present. No wound. Rash is not crusting, papular, urticarial or vesicular.     Comments: Rash and redness to her chest, arms and back from scratching.   Neurological:     General: No focal deficit present.     Mental Status: She is alert and oriented to person, place, and time.  Psychiatric:        Mood and Affect: Mood normal.        Behavior: Behavior normal.         Assessment And Plan:     1. Rash -Patient came here for a rash that she noticed this past weekend.  -She has not tried any new products or clothes -She is a dialysis patient  -She does dialysis at home and noticed that bag she used on Friday looked a  little different -Advised patient to let her dialysis group/center to know of her rash as it may be related to her dialysis product that she uses at home.  -Patient reports no shortness of breath or chest pain.  -Advised patient to continue to use benadryl as needed. - triamcinolone acetonide (KENALOG-40) injection 40 mg - triamcinolone ointment (KENALOG) 0.5 %; Apply 1 application topically 2 (two) times daily.  Dispense: 30 g; Refill: 1   Follow up: patient will follow up if her rash gets worse and does not get better.    Patient was given opportunity to ask questions. Patient verbalized understanding of the plan and was able to repeat key elements of the plan. All questions were answered to their satisfaction.  Bary Castilla, DNP   I, Bary Castilla, DNP  have reviewed all documentation for this visit. The documentation on 09/03/20 for the exam, diagnosis, procedures, and orders are all accurate and complete.    IF YOU HAVE BEEN REFERRED TO A SPECIALIST, IT MAY TAKE 1-2 WEEKS TO SCHEDULE/PROCESS THE REFERRAL. IF YOU HAVE NOT HEARD FROM US/SPECIALIST IN TWO WEEKS, PLEASE GIVE [place] A CALL AT [phone].   THE PATIENT IS ENCOURAGED TO PRACTICE SOCIAL DISTANCING DUE TO THE COVID-19 PANDEMIC.

## 2020-09-03 NOTE — Patient Instructions (Signed)
Rash, Adult  A rash is a change in the color of your skin. A rash can also change the way your skin feels. There are many different conditions and factors that can cause a rash. Follow these instructions at home: The goal of treatment is to stop the itching and keep the rash from spreading. Watch for any changes in your symptoms. Let your doctor know about them. Follow these instructions to help with your condition: Medicine Take or apply over-the-counter and prescription medicines only as told by your doctor. These may include medicines:  To treat red or swollen skin (corticosteroid creams).  To treat itching.  To treat an allergy (oral antihistamines).  To treat very bad symptoms (oral corticosteroids).   Skin care  Put cool cloths (compresses) on the affected areas.  Do not scratch or rub your skin.  Avoid covering the rash. Make sure that the rash is exposed to air as much as possible. Managing itching and discomfort  Avoid hot showers or baths. These can make itching worse. A cold shower may help.  Try taking a bath with: ? Epsom salts. You can get these at your local pharmacy or grocery store. Follow the instructions on the package. ? Baking soda. Pour a small amount into the bath as told by your doctor. ? Colloidal oatmeal. You can get this at your local pharmacy or grocery store. Follow the instructions on the package.  Try putting baking soda paste onto your skin. Stir water into baking soda until it gets like a paste.  Try putting on a lotion that relieves itchiness (calamine lotion).  Keep cool and out of the sun. Sweating and being hot can make itching worse. General instructions  Rest as needed.  Drink enough fluid to keep your pee (urine) pale yellow.  Wear loose-fitting clothing.  Avoid scented soaps, detergents, and perfumes. Use gentle soaps, detergents, perfumes, and other cosmetic products.  Avoid anything that causes your rash. Keep a journal to help  track what causes your rash. Write down: ? What you eat. ? What cosmetic products you use. ? What you drink. ? What you wear. This includes jewelry.  Keep all follow-up visits as told by your doctor. This is important.   Contact a doctor if:  You sweat at night.  You lose weight.  You pee (urinate) more than normal.  You pee less than normal, or you notice that your pee is a darker color than normal.  You feel weak.  You throw up (vomit).  Your skin or the whites of your eyes look yellow (jaundice).  Your skin: ? Tingles. ? Is numb.  Your rash: ? Does not go away after a few days. ? Gets worse.  You are: ? More thirsty than normal. ? More tired than normal.  You have: ? New symptoms. ? Pain in your belly (abdomen). ? A fever. ? Watery poop (diarrhea). Get help right away if:  You have a fever and your symptoms suddenly get worse.  You start to feel mixed up (confused).  You have a very bad headache or a stiff neck.  You have very bad joint pains or stiffness.  You have jerky movements that you cannot control (seizure).  Your rash covers all or most of your body. The rash may or may not be painful.  You have blisters that: ? Are on top of the rash. ? Grow larger. ? Grow together. ? Are painful. ? Are inside your nose or mouth.  You have   a rash that: ? Looks like purple pinprick-sized spots all over your body. ? Has a "bull's eye" or looks like a target. ? Is red and painful, causes your skin to peel, and is not from being in the sun too long. Summary  A rash is a change in the color of your skin. A rash can also change the way your skin feels.  The goal of treatment is to stop the itching and keep the rash from spreading.  Take or apply over-the-counter and prescription medicines only as told by your doctor.  Contact a doctor if you have new symptoms or symptoms that get worse.  Keep all follow-up visits as told by your doctor. This is  important. This information is not intended to replace advice given to you by your health care provider. Make sure you discuss any questions you have with your health care provider. Document Revised: 08/25/2018 Document Reviewed: 12/05/2017 Elsevier Patient Education  2021 [employer]  

## 2020-09-15 ENCOUNTER — Telehealth: Payer: Self-pay | Admitting: Internal Medicine

## 2020-09-15 DIAGNOSIS — E1122 Type 2 diabetes mellitus with diabetic chronic kidney disease: Secondary | ICD-10-CM

## 2020-09-15 DIAGNOSIS — M6530 Trigger finger, unspecified finger: Secondary | ICD-10-CM | POA: Diagnosis not present

## 2020-09-15 NOTE — Telephone Encounter (Signed)
Pt calling in needing a refill on  Semaglutide, 1 MG/DOSE, (OZEMPIC, 1 MG/DOSE,) 4 MG/3ML SOPN  And wants it sent to  [facility]  [address], [place], [place] [address]

## 2020-09-16 ENCOUNTER — Inpatient Hospital Stay: Payer: 59 | Attending: Genetic Counselor | Admitting: Genetic Counselor

## 2020-09-16 ENCOUNTER — Other Ambulatory Visit: Payer: Self-pay

## 2020-09-16 ENCOUNTER — Encounter: Payer: Self-pay | Admitting: Genetic Counselor

## 2020-09-16 ENCOUNTER — Inpatient Hospital Stay: Payer: 59

## 2020-09-16 ENCOUNTER — Other Ambulatory Visit: Payer: Self-pay | Admitting: Nurse Practitioner

## 2020-09-16 ENCOUNTER — Telehealth: Payer: Self-pay | Admitting: *Deleted

## 2020-09-16 DIAGNOSIS — Z8041 Family history of malignant neoplasm of ovary: Secondary | ICD-10-CM | POA: Diagnosis not present

## 2020-09-16 DIAGNOSIS — Z803 Family history of malignant neoplasm of breast: Secondary | ICD-10-CM

## 2020-09-16 DIAGNOSIS — Z800 Family history of malignant neoplasm of digestive organs: Secondary | ICD-10-CM | POA: Diagnosis not present

## 2020-09-16 DIAGNOSIS — N760 Acute vaginitis: Secondary | ICD-10-CM

## 2020-09-16 DIAGNOSIS — B9689 Other specified bacterial agents as the cause of diseases classified elsewhere: Secondary | ICD-10-CM

## 2020-09-16 LAB — GENETIC SCREENING ORDER

## 2020-09-16 MED ORDER — METRONIDAZOLE 0.75 % VA GEL: 1.0000 | Freq: Every day | VAGINAL | 0 refills | Status: AC

## 2020-09-16 MED ORDER — OZEMPIC (1 MG/DOSE) 4 MG/3ML ~~LOC~~ SOPN: 1.0000 mg | PEN_INJECTOR | SUBCUTANEOUS | 3 refills | Status: DC

## 2020-09-16 NOTE — Telephone Encounter (Signed)
Patient was prescribed Flagyl 500 mg tablets, patient asked if gel can be prescribed reports pills leave a horrible taste in her mouth. If you send Rx it should be sent to [facility].  Please advise

## 2020-09-16 NOTE — Telephone Encounter (Signed)
Rx sent to preferred pharmacy.

## 2020-09-16 NOTE — Progress Notes (Signed)
Patient did not tolerate PO Flagyl. She took 1 pill and then went out of town and is requesting vaginal gel. Sent to pharmacy.

## 2020-09-16 NOTE — Progress Notes (Signed)
REFERRING PROVIDER: Glendale Chard, [place] [address] [place],  [place] [address]  PRIMARY PROVIDER:  Glendale Chard, MD  PRIMARY REASON FOR VISIT:  1. Family history of breast cancer   2. Family history of stomach cancer   3. Family history of ovarian cancer      HISTORY OF PRESENT ILLNESS:   Cassandra Campos, a 50 y.o. female, was seen for a [place] cancer genetics consultation at the request of Dr. Baird Cancer due to a family history of cancer.  Cassandra Campos presents to clinic today to discuss the possibility of a hereditary predisposition to cancer, genetic testing, and to further clarify her future cancer risks, as well as potential cancer risks for family members.   Cassandra Campos does not have a personal history of cancer.    RISK FACTORS:  Menarche was at age 20-11.  First live birth at age 46.  OCP use for less than 1 month.  Ovaries intact: no.  Hysterectomy: yes.  Menopausal status: postmenopausal.  HRT use: approximately 7 years. Colonoscopy: yes; 03/24/2017 - normal. Mammogram within the last year: no, 07/25/2019, next mammogram scheduled 10/15/2020. Number of breast biopsies: 1. Up to date with pelvic exams: yes. Any excessive radiation exposure in the past: no.  Past Medical History:  Diagnosis Date  . Anemia associated with chronic renal failure   . ASCUS of cervix with negative high risk HPV 06/2017  . Back pain   . Chest pain   . CHF (congestive heart failure) ([place])   . CKD (chronic kidney disease), stage IV ([place]) no dialysis yet   nephrologist-  dr Jamal Maes-- scheduled next visit 09-08-2017 (lov 07-05-2017)  . Constipation   . Diabetic retinopathy ([place])   . Dialysis patient ([place])   . Edema, lower extremity   . Elevated transaminase level   . Family history of breast cancer   . Family history of ovarian cancer   . Family history of stomach cancer   . Fatty liver    FOLLOWED BY DR Henrene Pastor  . GERD (gastroesophageal reflux disease)   . Hypertension    . Idiopathic gout of multiple sites    09-05-2017 per pt stable  . Insulin dependent type 2 diabetes mellitus ([place])    followed by dr Toy Care  . Joint pain   . Mixed hyperlipidemia   . OSA (obstructive sleep apnea)    per study 10-20-2015 mild osa w/ AHI 10.3/hr;  09-05-2017 per pt has not used cpap since 1 yr ago  . Palpitations   . Peripheral neuropathy    feet  . S/P arteriovenous (AV) fistula creation 07-04-2015  w/ revision 02-14-2017   left braniocephilic  . Shortness of breath   . Trigger thumb, right thumb   . Umbilical hernia   . Vitamin D deficiency     Past Surgical History:  Procedure Laterality Date  . ABDOMINAL HYSTERECTOMY  07/11/2000   dr gott   TAH/BSO for irregular bleeding and leiomyoma  . AV FISTULA PLACEMENT Left 07/04/2015   Procedure: ARTERIOVENOUS (AV) FISTULA CREATION-LEFT;  Surgeon: Mal Misty, MD;  Location: [facility];  Service: Vascular;  Laterality: Left;  . BREAST EXCISIONAL BIOPSY Right    benign  . CARPAL TUNNEL RELEASE Bilateral 1993 and 1994  . CESAREAN SECTION  1993:  09-25-1999   BILATERAL TUBAL LIGATION W/ LAST C/S  . DILATION AND CURETTAGE OF UTERUS    . EXPLORATORY LAPARTOMY / LYSIS ADHESIONS/  BILATERAL SALPINGOOPHORECTOMY  07-20-2011  dr s. Rhodia Albright  [facility]  . FISTULA SUPERFICIALIZATION Left 08/28/2015   Procedure: BRACHIOCEPHALIC FISTULA SUPERFICIALIZATION;  Surgeon: Mal Misty, MD;  Location: [facility];  Service: Vascular;  Laterality: Left;  . PATCH ANGIOPLASTY Left 02/14/2017   Procedure: PATCH ANGIOPLASTY;  Surgeon: Elam Dutch, MD;  Location: [facility];  Service: Vascular;  Laterality: Left;  . PELVIC LAPAROSCOPY  1994   exploration for ectopic preg.  Marland Kitchen RETINAL DETACHMENT SURGERY Left 2015  . REVISON OF ARTERIOVENOUS FISTULA Left 02/14/2017   Procedure: REVISON OF ARTERIOVENOUS FISTULA  LEFT ARM;  Surgeon: Elam Dutch, MD;  Location: [facility];  Service: Vascular;  Laterality: Left;  . TRIGGER FINGER RELEASE Left 2015   thumb  .  TRIGGER FINGER RELEASE Right 09/09/2017   Procedure: RELEASE TRIGGER FINGER/A-1 PULLEY RIGHT THUMB;  Surgeon: Dorna Leitz, MD;  Location: [facility];  Service: Orthopedics;  Laterality: Right;    Social History   Socioeconomic History  . Marital status: Married    Spouse name: Not on file  . Number of children: 2  . Years of education: Not on file  . Highest education level: Not on file  Occupational History  . Occupation: Press photographer and insurance  Tobacco Use  . Smoking status: Never Smoker  . Smokeless tobacco: Never Used  Vaping Use  . Vaping Use: Never used  Substance and Sexual Activity  . Alcohol use: Yes    Alcohol/week: 0.0 standard drinks    Comment: Very rare  . Drug use: No  . Sexual activity: Yes    Birth control/protection: Surgical    Comment: HYST-1st intercourse 50 yo-Fewer than 5 partners  Other Topics Concern  . Not on file  Social History Narrative  . Not on file   Social Determinants of Health   Financial Resource Strain: Not on file  Food Insecurity: Not on file  Transportation Needs: Not on file  Physical Activity: Not on file  Stress: Not on file  Social Connections: Not on file     FAMILY HISTORY:  We obtained a detailed, 4-generation family history.  Significant diagnoses are listed below: Family History  Problem Relation Age of Onset  . Diabetes Mother   . Hypertension Mother   . Thyroid disease Mother   . Ovarian cancer Mother        dx 58s  . Diabetes Father   . Hypertension Father   . Cancer Father        STOMACH  . Stomach cancer Father 63  . Stroke Maternal Grandmother   . Stroke Maternal Grandfather   . Breast cancer Maternal Aunt 69  . Cancer Paternal Grandfather        unknown type, mets to brain  . Cancer Other        unknown type, father's first cousin  . Cancer Paternal Uncle        unknown type, dx >50  . Cancer Cousin        unknown type, dx 84s, paternal first cousin  . Cancer Other         unknown types, 4 of mother's first cousins  . Colon cancer Neg Hx   . Rectal cancer Neg Hx    Cassandra Campos has one daughter (age 84) and one son (age 38). She has one brother (age 26) and one sister (age 70). None of these relatives have had cancer.  Cassandra Campos mother is alive at age 75 and has a history of ovarian cancer diagnosed in her 55s. There  were two maternal aunts (one was a paternal half-sister to her mother) and four maternal uncles. One aunt (full-sister to her mother) was diagnosed with breast cancer at age 76. There is no known cancer among maternal first cousins. Cassandra Campos maternal grandparents died older than 56 without cancer. There are four maternal first cousins once removed (first cousins to her mother) who all had cancer, although Cassandra Campos is unsure what type of cancer they had.  Cassandra Campos father died at age 52 with stomach cancer diagnosed around age 23. There were four paternal aunts and four paternal uncles. One uncle died from cancer (unknown type) older than 19. One paternal first cousin died form cancer (unknown type) in her 12s. Cassandra Campos paternal grandmother died in her 70s without cancer. Her paternal grandfather died at an unknown age with metastatic cancer that affected his brain (primary unknown). There was also a paternal cousin once removed (first cousin to her father) who died from cancer (unknown type) older than 82.  Cassandra Campos is unaware of previous family history of genetic testing for hereditary cancer risks. Patient's maternal ancestors are of Black/African American and White/Caucasian descent, and paternal ancestors are of Black/African American descent. There is no reported Ashkenazi Jewish ancestry. There is no known consanguinity.  GENETIC COUNSELING ASSESSMENT: Cassandra Campos is a 50 y.o. female with a family history of ovarian cancer, breast cancer, and stomach cancer, which is somewhat suggestive of a hereditary cancer syndrome and  predisposition to cancer. We, therefore, discussed and recommended the following at today's visit.   DISCUSSION: We discussed that approximately 5-10% of cancer is hereditary, with most cases of hereditary breast and/or ovarian cancer associated with the BRCA1 and BRCA2 genes. There are other genes that can be associated with hereditary breast and/or ovarian cancer syndromes. These include ATM, CHEK2, PALB2, BRIP1, RAD51C, RAD51D, etc. We discussed that testing is beneficial for several reasons, including knowing about other cancer risks, identifying potential screening and risk-reduction options that may be appropriate, and to understand if other family members could be at risk for cancer and allow them to undergo genetic testing.  We reviewed the characteristics, features and inheritance patterns of hereditary cancer syndromes. We also discussed genetic testing, including the appropriate family members to test, the process of testing, insurance coverage and turn-around-time for results. We discussed the implications of a negative, positive and/or variant of uncertain significant result. We recommended Cassandra Campos pursue genetic testing for the [facility] CancerNext-Expanded + RNAinsight gene panel.   The CancerNext-Expanded + RNAinsight gene panel offered by [employer] and includes sequencing and rearrangement analysis for the following 77 genes: AIP, ALK, APC, ATM, AXIN2, BAP1, BARD1, BLM, BMPR1A, BRCA1, BRCA2, BRIP1, CDC73, CDH1, CDK4, CDKN1B, CDKN2A, CHEK2, CTNNA1, DICER1, FANCC, FH, FLCN, GALNT12, KIF1B, LZTR1, MAX, MEN1, MET, MLH1, MSH2, MSH3, MSH6, MUTYH, NBN, NF1, NF2, NTHL1, PALB2, PHOX2B, PMS2, POT1, PRKAR1A, PTCH1, PTEN, RAD51C, RAD51D, RB1, RECQL, RET, SDHA, SDHAF2, SDHB, SDHC, SDHD, SMAD4, SMARCA4, SMARCB1, SMARCE1, STK11, SUFU, TMEM127, TP53, TSC1, TSC2, VHL and XRCC2 (sequencing and deletion/duplication); EGFR, EGLN1, HOXB13, KIT, MITF, PDGFRA, POLD1 and POLE (sequencing only); EPCAM and GREM1  (deletion/duplication only). RNA data is routinely analyzed for use in variant interpretation for all genes.  Based on Cassandra Campos's family history of cancer, she meets medical criteria for genetic testing. Despite that she meets criteria, there may still be an out of pocket cost. We discussed that if her out of pocket cost for testing is over $100, the laboratory will reach out to  let her know. If the out of pocket cost of testing is less than $100 she will be billed by the genetic testing laboratory.   Lastly, we discussed that some people do not want to undergo genetic testing due to fear of genetic discrimination. A federal law called the Genetic Information Non-Discrimination Act (GINA) of 2008 helps protect individuals against genetic discrimination based on their genetic test results. It impacts both health insurance and employment. With health insurance, it protects against increased premiums, being kicked off insurance or being forced to take a test in order to be insured. For employment it protects against hiring, firing and promoting decisions based on genetic test results. Health status due to a cancer diagnosis is not protected under GINA. Additionally, life, disability, and long-term care insurance is not protected under GINA.   PLAN: After considering the risks, benefits, and limitations, Cassandra Campos provided informed consent to pursue genetic testing and the blood sample was sent to [facility] for analysis of the CancerNext-Expanded + RNAinsight panel. Results should be available within approximately two-three weeks' time, at which point they will be disclosed by telephone to Cassandra Campos, as will any additional recommendations warranted by these results. Cassandra Campos will receive a summary of her genetic counseling visit and a copy of her results once available. This information will also be available in Epic.   Cassandra Campos questions were answered to her satisfaction today.  Our contact information was provided should additional questions or concerns arise. Thank you for the referral and allowing [place] to share in the care of your patient.   Clint Guy, [place], [facility] Licensed, Certified Dispensing [email or web address] Phone: [phone]  The patient was seen for a total of 35 minutes in face-to-face genetic counseling.  This patient was discussed with Drs. Magrinat, Lindi Adie and/or Burr Medico who agrees with the above.    _______________________________________________________________________ For Office Staff:  Number of people involved in session: 1 Was an Intern/ student involved with case: no

## 2020-09-16 NOTE — Telephone Encounter (Signed)
Patient said she only took 1 pill and then she went out of town and didn't call to ask for the gel.

## 2020-09-16 NOTE — Telephone Encounter (Signed)
Flagyl was prescribed 4/18, so treatment should have been completed a week ago. Did she complete course?

## 2020-09-16 NOTE — Telephone Encounter (Signed)
Metrogel 0.75% nightly x 5 nights sent to pharmacy.

## 2020-09-17 NOTE — Telephone Encounter (Signed)
Patient informed. 

## 2020-10-01 ENCOUNTER — Ambulatory Visit: Payer: Self-pay | Admitting: Genetic Counselor

## 2020-10-01 ENCOUNTER — Telehealth: Payer: Self-pay | Admitting: Genetic Counselor

## 2020-10-01 ENCOUNTER — Encounter: Payer: Self-pay | Admitting: Genetic Counselor

## 2020-10-01 DIAGNOSIS — Z1379 Encounter for other screening for genetic and chromosomal anomalies: Secondary | ICD-10-CM

## 2020-10-01 DIAGNOSIS — Z0000 Encounter for general adult medical examination without abnormal findings: Secondary | ICD-10-CM | POA: Insufficient documentation

## 2020-10-01 NOTE — Progress Notes (Signed)
HPI:  [name] was previously seen in the [facility] clinic due to a family history of cancer and concerns regarding a hereditary predisposition to cancer. Please refer to our prior cancer genetics clinic note for more information regarding our discussion, assessment and recommendations, at the time. [name] recent genetic test results were disclosed to her, as were recommendations warranted by these results. These results and recommendations are discussed in more detail below.  FAMILY HISTORY:  We obtained a detailed, 4-generation family history.  Significant diagnoses are listed below: Family History  Problem Relation Age of Onset  . Diabetes Mother   . Hypertension Mother   . Thyroid disease Mother   . Ovarian cancer Mother        dx 67s  . Diabetes Father   . Hypertension Father   . Cancer Father        STOMACH  . Stomach cancer Father 4  . Stroke Maternal Grandmother   . Stroke Maternal Grandfather   . Breast cancer Maternal Aunt 69  . Cancer Paternal Grandfather        unknown type, mets to brain  . Cancer Other        unknown type, father's first cousin  . Cancer Paternal Uncle        unknown type, dx >50  . Cancer Cousin        unknown type, dx 76s, paternal first cousin  . Cancer Other        unknown types, 4 of mother's first cousins  . Colon cancer Neg Hx   . Rectal cancer Neg Hx     [name] has one daughter (age 62) and one son (age 37). She has one brother (age 13) and one sister (age 44). None of these relatives have had cancer.  [name] mother is alive at age 21 and has a history of ovarian cancer diagnosed in her 39s. There were two maternal aunts (one was a paternal half-sister to her mother) and four maternal uncles. One aunt (full-sister to her mother) was diagnosed with breast cancer at age 72. There is no known cancer among maternal first cousins. [name] maternal grandparents died older than 86 without cancer. There  are four maternal first cousins once removed (first cousins to her mother) who all had cancer, although [name] is unsure what type of cancer they had.  [name] father died at age 62 with stomach cancer diagnosed around age 24. There were four paternal aunts and four paternal uncles. One uncle died from cancer (unknown type) older than 60. One paternal first cousin died form cancer (unknown type) in her 75s. [name] paternal grandmother died in her 20s without cancer. Her paternal grandfather died at an unknown age with metastatic cancer that affected his brain (primary unknown). There was also a paternal cousin once removed (first cousin to her father) who died from cancer (unknown type) older than 110.  [name] is unaware of previous family history of genetic testing for hereditary cancer risks. Patient's maternal ancestors are of Black/African American and White/Caucasian descent, and paternal ancestors are of Black/African American descent. There is no reported Ashkenazi Jewish ancestry. There is no known consanguinity.  GENETIC TEST RESULTS: Genetic testing reported out on 10/01/2020 through the [facility] CancerNext-Expanded + RNAinsight panel. No pathogenic variants were detected.   The CancerNext-Expanded + RNAinsight gene panel offered by [employer] and includes sequencing and rearrangement analysis for the following 77 genes: AIP, ALK, APC, ATM,  AXIN2, BAP1, BARD1, BLM, BMPR1A, BRCA1, BRCA2, BRIP1, CDC73, CDH1, CDK4, CDKN1B, CDKN2A, CHEK2, CTNNA1, DICER1, FANCC, FH, FLCN, GALNT12, KIF1B, LZTR1, MAX, MEN1, MET, MLH1, MSH2, MSH3, MSH6, MUTYH, NBN, NF1, NF2, NTHL1, PALB2, PHOX2B, PMS2, POT1, PRKAR1A, PTCH1, PTEN, RAD51C, RAD51D, RB1, RECQL, RET, SDHA, SDHAF2, SDHB, SDHC, SDHD, SMAD4, SMARCA4, SMARCB1, SMARCE1, STK11, SUFU, TMEM127, TP53, TSC1, TSC2, VHL and XRCC2 (sequencing and deletion/duplication); EGFR, EGLN1, HOXB13, KIT, MITF, PDGFRA, POLD1 and POLE (sequencing only); EPCAM  and GREM1 (deletion/duplication only). RNA data is routinely analyzed for use in variant interpretation for all genes. The test report will be scanned into EPIC and located under the Molecular Pathology section of the Results Review tab.  A portion of the result report is included below for reference.     We discussed with [name] that because current genetic testing is not perfect, it is possible there may be a gene mutation in one of these genes that current testing cannot detect, but that chance is small.  We also discussed that there could be another gene that has not yet been discovered, or that we have not yet tested, that is responsible for the cancer diagnoses in the family. It is also possible there is a hereditary cause for the cancer in the family that [name] did not inherit and therefore was not identified in her testing.  Therefore, it is important to remain in touch with cancer genetics in the future so that we can continue to offer [name] the most up to date genetic testing.   ADDITIONAL GENETIC TESTING: We discussed with [name] that her genetic testing was fairly extensive.  If there are genes identified to increase cancer risk that can be analyzed in the future, we would be happy to discuss and coordinate this testing at that time.    CANCER SCREENING RECOMMENDATIONS: [name] test result is considered negative (normal).  This means that we have not identified a hereditary cause for her family history of cancer at this time. While reassuring, this does not definitively rule out a hereditary predisposition to cancer. It is still possible that there could be genetic mutations that are undetectable by current technology. There could be genetic mutations in genes that have not been tested or identified to increase cancer risk.  Therefore, it is recommended she continue to follow the cancer management and screening guidelines provided by her and primary healthcare  provider.   An individual's cancer risk and medical management are not determined by genetic test results alone. Overall cancer risk assessment incorporates additional factors, including personal medical history, family history, and any available genetic information that may result in a personalized plan for cancer prevention and surveillance.  Breast Cancer Risk: Based on [name] personal and family history, as well as her genetic test results, the Cassandra Campos was used to estimate her risk of developing breast cancer. Tyrer-Cuzick estimates her lifetime risk of developing breast cancer to be approximately 10.7%. This lifetime breast cancer risk is a preliminary estimate based on available information using one of several models endorsed by the [place] (ACS). The ACS recommends consideration of breast MRI screening as an adjunct to mammography for patients at high risk (defined as 20% or greater lifetime risk). A more detailed breast cancer risk assessment can be considered, if clinically indicated. This risk estimate can change over time and that this calculation may be repeated to reflect new information in her personal or family history in the future.  RECOMMENDATIONS FOR FAMILY MEMBERS:  Individuals in this family might be at some increased risk of developing cancer, over the general population risk, simply due to the family history of cancer.  We recommended women in this family have a yearly mammogram beginning at age 95, or 8 years younger than the earliest onset of cancer, an annual clinical breast exam, and perform monthly breast self-exams. Women in this family should also have a gynecological exam as recommended by their primary provider. All family members should be referred for colonoscopy starting at age 22.  It is also possible there is a hereditary cause for the cancer in [name]'s family that she did not inherit and therefore was not  identified in her.  Based on [name]'s family history, we recommended her mother, who was diagnosed with ovarian cancer in her 74s, have genetic counseling and testing. [name] will let us know if we can be of any assistance in coordinating genetic counseling and/or testing for this family member.   FOLLOW-UP: Lastly, we discussed with [name] that cancer genetics is a rapidly advancing field and it is possible that new genetic tests will be appropriate for her and/or her family members in the future. We encouraged her to remain in contact with cancer genetics on an annual basis so we can update her personal and family histories and let her know of advances in cancer genetics that may benefit this family.   Our contact number was provided. [name] questions were answered to her satisfaction, and she knows she is welcome to call us at anytime with additional questions or concerns.   Clint Guy, MS, [facility] Genetic Counselor [email or web address] Phone: [phone]

## 2020-10-01 NOTE — Telephone Encounter (Signed)
Revealed negative genetic testing. Discussed that we do not know why there is cancer in the family. There could be a genetic mutation in the family that Cassandra Campos did not inherit. There could also be a mutation in a different gene that we are not testing, or our current technology may not be able to detect certain mutations. It will therefore be important for her to stay in contact with genetics to keep up with whether additional testing may be appropriate in the future.

## 2020-10-07 ENCOUNTER — Other Ambulatory Visit: Payer: Self-pay

## 2020-10-07 ENCOUNTER — Ambulatory Visit: Payer: Medicare Other

## 2020-10-15 ENCOUNTER — Other Ambulatory Visit: Payer: Self-pay

## 2020-10-15 ENCOUNTER — Ambulatory Visit
Admission: RE | Admit: 2020-10-15 | Discharge: 2020-10-15 | Disposition: A | Discharge status: Home / Self Care | Payer: Medicare Other | Source: Ambulatory Visit | Attending: Internal Medicine | Admitting: Internal Medicine

## 2020-10-15 DIAGNOSIS — Z1231 Encounter for screening mammogram for malignant neoplasm of breast: Secondary | ICD-10-CM

## 2020-10-20 DIAGNOSIS — G4733 Obstructive sleep apnea (adult) (pediatric): Secondary | ICD-10-CM | POA: Diagnosis not present

## 2020-10-22 DIAGNOSIS — M65341 Trigger finger, right ring finger: Secondary | ICD-10-CM | POA: Diagnosis not present

## 2020-10-24 ENCOUNTER — Other Ambulatory Visit: Payer: Self-pay | Admitting: Internal Medicine

## 2020-10-30 ENCOUNTER — Other Ambulatory Visit: Payer: Self-pay

## 2020-10-30 MED ORDER — MITIGARE 0.6 MG PO CAPS: ORAL_CAPSULE | ORAL | 0 refills | Status: DC

## 2020-11-05 ENCOUNTER — Other Ambulatory Visit: Payer: Self-pay | Admitting: Internal Medicine

## 2020-11-21 ENCOUNTER — Encounter: Payer: Self-pay | Admitting: Internal Medicine

## 2020-11-21 ENCOUNTER — Other Ambulatory Visit: Payer: Self-pay

## 2020-11-21 ENCOUNTER — Ambulatory Visit (INDEPENDENT_AMBULATORY_CARE_PROVIDER_SITE_OTHER): Payer: Medicare Other | Admitting: Internal Medicine

## 2020-11-21 VITALS — BP 120/68 | HR 79 | Ht 63.5 in | Wt 231.8 lb

## 2020-11-21 DIAGNOSIS — E785 Hyperlipidemia, unspecified: Secondary | ICD-10-CM

## 2020-11-21 DIAGNOSIS — N186 End stage renal disease: Secondary | ICD-10-CM | POA: Diagnosis not present

## 2020-11-21 DIAGNOSIS — Z794 Long term (current) use of insulin: Secondary | ICD-10-CM | POA: Diagnosis not present

## 2020-11-21 DIAGNOSIS — E1122 Type 2 diabetes mellitus with diabetic chronic kidney disease: Secondary | ICD-10-CM

## 2020-11-21 DIAGNOSIS — E669 Obesity, unspecified: Secondary | ICD-10-CM | POA: Diagnosis not present

## 2020-11-21 DIAGNOSIS — E1169 Type 2 diabetes mellitus with other specified complication: Secondary | ICD-10-CM | POA: Diagnosis not present

## 2020-11-21 DIAGNOSIS — Z992 Dependence on renal dialysis: Secondary | ICD-10-CM

## 2020-11-21 NOTE — Patient Instructions (Addendum)
Please restart: - Ozempic 1 mg weekly  Continue: - U500 insulin: - 200 units before breakfast - 140-160 units before dinner  Check with your insurance if: - Ozempic 2 mg weekly - Mounjaro 5, 7.5, 10 mg weekly are covered  Please try to start the CGM.  Please return in 3-4 months.

## 2020-11-21 NOTE — Progress Notes (Signed)
Patient ID: [name], female   DOB: November 23, 1970, 50 y.o.   MRN: [id number]   This visit occurred during the SARS-CoV-2 public health emergency.  Safety protocols were in place, including screening questions prior to the visit, additional usage of staff PPE, and extensive cleaning of exam room while observing appropriate contact time as indicated for disinfecting solutions.   HPI: Quinlee Sciarra is a 50 y.o.-year-old female, returning for follow-up for DM2, dx in 1997, insulin-dependent right after dx, uncontrolled, with complications (ESRD-on HD since 12/2017, DR, PN).  Last visit 3 months ago (virtual).  Interim hx: No blurry vision, nausea, chest pain. She continues to see the transplant clinic at [facility].  Reviewed HbA1c levels: 11/18/2020: HbA1c 8.9% Lab Results  Component Value Date   HGBA1C 8.5 (A) 07/30/2020   HGBA1C 7.9 (H) 01/08/2020   HGBA1C 8.7 (A) 09/11/2019   HGBA1C 10.9 (H) 04/19/2019   HGBA1C 6.9 (H) 11/09/2018   HGBA1C 7.1 (H) 07/11/2018   HGBA1C 8.5 (H) 03/27/2018   HGBA1C 8.2 (H) 02/08/2013  05/26/2020: HbA1c 9.8%  Pt is on a regimen of: - Ozempic 1 mg weekly - was out for 1 mo >> did not restart although she does have it at home - U500 insulin (started 2020) - 180-195 >> 200 units before breakfast - 140-150 units before dinner if dinner is early, and 110-120 units if dinner is after dialysis >> 160-170 >> actually 140-160 units before dinner She was on Levemir and Humalog.  Pt checks her sugars 2-3 times a day - not in last weeks as she does not have a meter, but plans to get the CGM: - am: 78 -226, 407 >> 59-455 >> 97-203 >> 126-180, 200s >> 128-237 - 2h after b'fast: n/c - before lunch: 92-300s >> 180-405 >> 101-245 >> 110-120 >> 125-300 - 2h after lunch: n/c - before dinner: 125-140 >> (6:30-7 pm): 58, 108, 120-150, 200s >> 90-250 - 2h after dinner: n/c - bedtime: n/c   - nighttime: n/c Lowest sugar was 59 >> 97 >> 58 (before dinner) >>  90; she has hypoglycemia awareness in the 60s. Highest sugar was 455 >> 260 >> 200s >> 300s.  Glucometer: Contour next  Pt's meals are: - Breakfast: yoghurt and blueberries; egg McMuffin - Lunch: fish, tacos, subs, cake - Dinner: salad, steak, shrimp - occasionally after HD at 10 pm, but more recently she has this during dialysis, around 7 PM - Snacks: fruit, peanuts  -+ End-stage renal disease, last BUN/creatinine:  Lab Results  Component Value Date   BUN 60 (H) 08/25/2020   BUN 35 (H) 07/17/2019   CREATININE 9.38 (H) 08/25/2020   CREATININE 5.58 (H) 07/17/2019   She is on hemodialysis: M, Tu, Th, F (home) - in the evening, for 3-4 hours: 6:30 PM to 10 PM She is on the kidney transplant list at [facility] Requirements for kidney transplant: <200 lbs, HbA1c <8% (? 7%).  -+ HL; last set of lipids: Lab Results  Component Value Date   CHOL 183 08/25/2020   HDL 39 (L) 08/25/2020   LDLCALC 63 08/25/2020   TRIG 536 (H) 08/25/2020   CHOLHDL 4.7 (H) 08/25/2020  On Crestor 20.  - last eye exam was on 03/20/2020: + DR. + h/o retinal detachment, + cataracts.  She gets IO injections.  - she has numbness and tingling in her feet.  She changed from Lyrica to Neurontin 300 mg daily since last visit.  Pt has FH of DM in M, F.  She also has a history of NAFLD, OSA (but not on CPAP, since she could not tolerate it-now on nasal pillows), gout.   ROS: + Hot flashes - improved, + numbness and tingling in his feet  Past Medical History:  Diagnosis Date   Anemia associated with chronic renal failure    ASCUS of cervix with negative high risk HPV 06/2017   Back pain    Chest pain    CHF (congestive heart failure) ([place])    CKD (chronic kidney disease), stage IV ([place]) no dialysis yet   nephrologist-  dr Jamal Maes-- scheduled next visit 09-08-2017 (lov 07-05-2017)   Constipation    Diabetic retinopathy ([place])    Dialysis patient ([place])    Edema, lower extremity    Elevated transaminase  level    Family history of breast cancer    Family history of ovarian cancer    Family history of stomach cancer    Fatty liver    FOLLOWED BY DR Henrene Pastor   GERD (gastroesophageal reflux disease)    Hypertension    Idiopathic gout of multiple sites    09-05-2017 per pt stable   Insulin dependent type 2 diabetes mellitus ([place])    followed by dr Toy Care   Joint pain    Mixed hyperlipidemia    OSA (obstructive sleep apnea)    per study 10-20-2015 mild osa w/ AHI 10.3/hr;  09-05-2017 per pt has not used cpap since 1 yr ago   Palpitations    Peripheral neuropathy    feet   S/P arteriovenous (AV) fistula creation 07-04-2015  w/ revision 02-14-2017   left braniocephilic   Shortness of breath    Trigger thumb, right thumb    Umbilical hernia    Vitamin D deficiency    Past Surgical History:  Procedure Laterality Date   ABDOMINAL HYSTERECTOMY  07/11/2000   dr gott   TAH/BSO for irregular bleeding and leiomyoma   AV FISTULA PLACEMENT Left 07/04/2015   Procedure: ARTERIOVENOUS (AV) FISTULA CREATION-LEFT;  Surgeon: Mal Misty, MD;  Location: [facility];  Service: Vascular;  Laterality: Left;   BREAST EXCISIONAL BIOPSY Right    benign   CARPAL TUNNEL RELEASE Bilateral 1993 and [place]:  09-25-1999   BILATERAL TUBAL LIGATION W/ LAST C/S   DILATION AND CURETTAGE OF UTERUS     EXPLORATORY [place] / LYSIS ADHESIONS/  BILATERAL SALPINGOOPHORECTOMY  07-20-2011  dr s. Rhodia Albright  [facility]   FISTULA SUPERFICIALIZATION Left 08/28/2015   Procedure: BRACHIOCEPHALIC FISTULA SUPERFICIALIZATION;  Surgeon: Mal Misty, MD;  Location: [place];  Service: Vascular;  Laterality: Left;   PATCH ANGIOPLASTY Left 02/14/2017   Procedure: PATCH ANGIOPLASTY;  Surgeon: Elam Dutch, MD;  Location: [facility];  Service: Vascular;  Laterality: Left;   PELVIC LAPAROSCOPY  1994   exploration for ectopic preg.   RETINAL DETACHMENT SURGERY Left 2015   REVISON OF ARTERIOVENOUS FISTULA Left 02/14/2017    Procedure: REVISON OF ARTERIOVENOUS FISTULA  LEFT ARM;  Surgeon: Elam Dutch, MD;  Location: [place] [facility];  Service: Vascular;  Laterality: Left;   TRIGGER FINGER RELEASE Left 2015   thumb   TRIGGER FINGER RELEASE Right 09/09/2017   Procedure: RELEASE TRIGGER FINGER/A-1 PULLEY RIGHT THUMB;  Surgeon: Dorna Leitz, MD;  Location: [place];  Service: Orthopedics;  Laterality: Right;   Social History   Socioeconomic History   Marital status: Married    Spouse name: Not on file   Number of children:  2   Years of education: Not on file   Highest education level: Not on file  Occupational History   Occupation: medical billing and insurance  Tobacco Use   Smoking status: Never   Smokeless tobacco: Never  Vaping Use   Vaping Use: Never used  Substance and Sexual Activity   Alcohol use: Yes    Alcohol/week: 0.0 standard drinks    Comment: Very rare   Drug use: No   Sexual activity: Yes    Birth control/protection: Surgical    Comment: HYST-1st intercourse 50 yo-Fewer than 5 partners  Other Topics Concern   Not on file  Social History Narrative   Not on file   Social Determinants of Health   Financial Resource Strain: Not on file  Food Insecurity: Not on file  Transportation Needs: Not on file  Physical Activity: Not on file  Stress: Not on file  Social Connections: Not on file  Intimate Partner Violence: Not on file   Current Outpatient Medications on File Prior to Visit  Medication Sig Dispense Refill   allopurinol (ZYLOPRIM) 300 MG tablet TAKE 1 TABLET BY MOUTH EVERY MORNING 90 tablet 1   Armodafinil 150 MG tablet Take 1 tablet (150 mg total) by mouth daily. 30 tablet 5   B Complex-C-Folic Acid (RENAL) 1 MG CAPS daily at 2 am.     calcitRIOL (ROCALTROL) 0.5 MCG capsule Take 0.5 mcg by mouth daily.     cinacalcet (SENSIPAR) 60 MG tablet Take 60 mg by mouth daily. Every other day     Continuous Blood Gluc Receiver (FREESTYLE LIBRE 2 READER) DEVI 1 each by Does  not apply route daily. 1 each 0   Continuous Blood Gluc Sensor (FREESTYLE LIBRE 2 SENSOR) MISC 1 each by Does not apply route every 14 (fourteen) days. 6 each 3   cromolyn (OPTICROM) 4 % ophthalmic solution 1 drop 4 (four) times daily. (Patient not taking: No sig reported)     Insulin Pen Needle 32G X 4 MM MISC Use 2x a day 200 each 3   insulin regular human CONCENTRATED (HUMULIN R U-500 KWIKPEN) 500 UNIT/ML kwikpen Inject 200 units before breakfast 160-170 units before dinner 16 mL 11   lanthanum (FOSRENOL) 1000 MG chewable tablet Chew 1,000 mg by mouth 3 (three) times daily with meals.     lidocaine-prilocaine (EMLA) cream USE ON ARAMS AS NEEDED FOR DIALYSIS  10   Microlet Lancets MISC Use as directed to check blood sugars 4 times per day dx: e11.22 300 each 2   MITIGARE 0.6 MG CAPS TAKE ONE CAPSULE BY MOUTH ONCE DAILY 90 capsule 0   MITIGARE 0.6 MG CAPS Monday, Wednesday, and Friday 90 capsule 0   pregabalin (LYRICA) 100 MG capsule Take 1 capsule (100 mg total) by mouth daily. In the morning 90 capsule 1   pregabalin (LYRICA) 75 MG capsule Take 1 capsule (75 mg total) by mouth daily. At bedtime 90 capsule 1   RESTASIS 0.05 % ophthalmic emulsion INSTILL 1 DROP INTO BOTH EYES TWICE A DAY  3   rosuvastatin (CRESTOR) 20 MG tablet TAKE 1 TABLET BY MOUTH EVERY DAY 90 tablet 3   Semaglutide, 1 MG/DOSE, (OZEMPIC, 1 MG/DOSE,) 4 MG/3ML SOPN Inject 1 mg into the skin once a week. 9 mL 3   Semaglutide-Weight Management (WEGOVY) 1.7 MG/0.75ML SOAJ Inject 1.7 mg into the skin once a week. (Patient not taking: Reported on 09/01/2020) 3 mL 0   triamcinolone ointment (KENALOG) 0.5 % Apply 1 application topically  2 (two) times daily. 30 g 1   No current facility-administered medications on file prior to visit.   No Known Allergies Family History  Problem Relation Age of Onset   Diabetes Mother    Hypertension Mother    Thyroid disease Mother    Ovarian cancer Mother        dx 27s   Diabetes Father     Hypertension Father    Cancer Father        STOMACH   Stomach cancer Father 17   Stroke Maternal Grandmother    Stroke Maternal Grandfather    Breast cancer Maternal Aunt 51   Cancer Paternal Grandfather        unknown type, mets to brain   Cancer Other        unknown type, father's first cousin   Cancer Paternal Uncle        unknown type, dx >50   Cancer Cousin        unknown type, dx 84s, paternal first cousin   Cancer Other        unknown types, 4 of mother's first cousins   Colon cancer Neg Hx    Rectal cancer Neg Hx    PE: BP 120/68 (BP Location: Right Arm, Patient Position: Sitting, Cuff Size: Normal)   Pulse 79   Ht 5' 3.5" (1.613 m)   Wt 231 lb 12.8 oz (105.1 kg)   SpO2 97%   BMI 40.42 kg/m  Wt Readings from Last 3 Encounters:  11/21/20 231 lb 12.8 oz (105.1 kg)  09/03/20 229 lb 12.8 oz (104.2 kg)  09/01/20 232 lb (105.2 kg)   Constitutional: overweight, in NAD Eyes: PERRLA, EOMI, no exophthalmos ENT: moist mucous membranes, no thyromegaly, no cervical lymphadenopathy Cardiovascular: RRR, No MRG Respiratory: CTA B Gastrointestinal: abdomen soft, NT, ND, BS+ Musculoskeletal: no deformities, strength intact in all 4 Skin: moist, warm, no rashes Neurological: no tremor with outstretched hands, DTR normal in all 4  ASSESSMENT: 1. DM2, insulin-dependent, uncontrolled, with complications - ESRD, on HD, awaiting kidney transplant - DR - PN  2. HL  3.  Obesity class III  PLAN:  1. Patient with longstanding, uncontrolled, type 2 diabetes, very insulin resistant, on concentrated insulin (U500) and weekly GLP-1 receptor agonist.  At last visit, HbA1c was slightly better, at 8.5%, but since then, she had another HbA1c obtained 3 days ago and this was higher, at 8.9%.  At last visit, sugars were still above target, but improved later in the day.  As sugars were high in the morning, even occasionally in the 200s, I advised her to increase her U500 insulin before  dinner, especially with a larger dinner.  She was not checking blood sugars at bedtime, after dialysis, and I advised her to try to do so.  I also advised her to try to get the CGM and I sent the prescription for the freestyle libre CGM to her pharmacy.  She did not get it yet. -At today's visit, sugars are quite high, and variable.  Upon questioning, she was out of Ozempic for a month prior to the last HbA1c and she just obtained it but did not start it yet.  We discussed about starting this at 1 mg weekly.  However, I advised her that if the sugars do not improve, to try to take 2 of the 1 mg weekly injections at that time.  In the meantime, I advised her to check with her insurance whether the 2 mg Ozempic  dose is covered.  Also, we discussed about Mounjaro and I advised her to also check with her insurance whether this is covered.  We also discussed about how to change her U500 insulin doses depending on the size and consistency of her meals.  I advised her that the recommended doses are just starting doses.  As of now, she is not checking sugars, as she lost her meter, however, she is planning to get the CGM that I recommended last visit - I suggested to:  Patient Instructions  Please restart: - Ozempic 1 mg weekly  Continue: - U500 insulin: - 200 units before breakfast - 140-160 units before dinner  Check with your insurance if: - Ozempic 2 mg weekly - Mounjaro 5, 7.5, 10 mg weekly are covered  Please try to start the CGM.  Please return in 3-4 months.   - advised to check sugars at different times of the day - 4x a day, rotating check times - advised for yearly eye exams >> she is UTD - return to clinic in 3-4 months  2. HL -Reviewed latest lipid panel from 08/2020: LDL at goal, triglycerides very high, HDL low: Lab Results  Component Value Date   CHOL 183 08/25/2020   HDL 39 (L) 08/25/2020   LDLCALC 63 08/25/2020   TRIG 536 (H) 08/25/2020   CHOLHDL 4.7 (H) 08/25/2020   -Continues Crestor 20 mg daily without side effects  3.  Obesity class III -She saw weight management in the past- but the meals were not suited for her 2/2 HD -We will continue Ozempic, which should also help with weight loss -Weight was stable at last visit, now gained 2 pounds  Philemon Kingdom, MD PhD [facility] Endocrinology

## 2020-11-24 ENCOUNTER — Encounter (INDEPENDENT_AMBULATORY_CARE_PROVIDER_SITE_OTHER): Payer: Self-pay | Admitting: Family Medicine

## 2020-11-24 ENCOUNTER — Other Ambulatory Visit: Payer: Self-pay

## 2020-11-24 ENCOUNTER — Encounter: Payer: Self-pay | Admitting: Internal Medicine

## 2020-11-24 ENCOUNTER — Ambulatory Visit (INDEPENDENT_AMBULATORY_CARE_PROVIDER_SITE_OTHER): Payer: Medicare Other | Admitting: Family Medicine

## 2020-11-24 ENCOUNTER — Encounter (INDEPENDENT_AMBULATORY_CARE_PROVIDER_SITE_OTHER): Payer: Self-pay

## 2020-11-24 VITALS — BP 104/67 | HR 77 | Temp 97.9°F | Ht 63.0 in | Wt 227.0 lb

## 2020-11-24 DIAGNOSIS — E1169 Type 2 diabetes mellitus with other specified complication: Secondary | ICD-10-CM

## 2020-11-24 DIAGNOSIS — Z992 Dependence on renal dialysis: Secondary | ICD-10-CM

## 2020-11-24 DIAGNOSIS — Z9189 Other specified personal risk factors, not elsewhere classified: Secondary | ICD-10-CM | POA: Diagnosis not present

## 2020-11-24 DIAGNOSIS — K760 Fatty (change of) liver, not elsewhere classified: Secondary | ICD-10-CM

## 2020-11-24 DIAGNOSIS — N186 End stage renal disease: Secondary | ICD-10-CM

## 2020-11-24 DIAGNOSIS — E1122 Type 2 diabetes mellitus with diabetic chronic kidney disease: Secondary | ICD-10-CM

## 2020-11-24 DIAGNOSIS — Z6841 Body Mass Index (BMI) 40.0 and over, adult: Secondary | ICD-10-CM

## 2020-11-24 DIAGNOSIS — Z794 Long term (current) use of insulin: Secondary | ICD-10-CM

## 2020-11-24 DIAGNOSIS — E66813 Obesity, class 3: Secondary | ICD-10-CM

## 2020-11-24 DIAGNOSIS — E785 Hyperlipidemia, unspecified: Secondary | ICD-10-CM

## 2020-11-24 MED ORDER — MOUNJARO 2.5 MG/0.5ML ~~LOC~~ SOPN
2.5000 mg | PEN_INJECTOR | SUBCUTANEOUS | 0 refills | Status: AC
Start: 2020-11-24 — End: 2020-12-24

## 2020-11-24 NOTE — Progress Notes (Signed)
Chief Complaint:   OBESITY Cassandra Campos is here to discuss her progress with her obesity treatment plan along with follow-up of her obesity related diagnoses. See Medical Weight Management Flowsheet for complete bioelectrical impedance results.  Today's visit was #: 8 Starting weight: 228 lbs Starting date: 08/30/2019 Today's weight: 227 lbs Today's date: 11/24/2020 Weight change since last visit: 0 Total lbs lost to date: 1 lb Body mass index is 40.21 kg/m.  Total weight loss percentage to date: -0.44%  Interim History:  Cassandra Campos goal is a 30 pound weight loss.  She has 3 months to make headway.  She has a new CGM.  This morning, her blood sugar was 82.  Cassandra Campos provided the following food recall:  She is having more salads and smaller portions.   She is drinking no soda or juice.   She has increased her water intake and is using sugar-free flavoring.  Nutrition Plan: keeping a food journal and adhering to recommended goals of 1200-1500 calories and 95 grams of protein for 0% of the time. Activity:  Walking for 15 minutes 3 times per week. Anti-obesity medications: Ozempic 1 mg subcutaneously weekly. Reported side effects: None.  Assessment/Plan:   1. Type 2 diabetes mellitus with chronic kidney disease on chronic dialysis, with long-term current use of insulin (HCC) Diabetes Mellitus: Not at goal. Medication: Ozempic 1 mg subcutaneously weekly, Humulin R, . Issues reviewed: blood sugar goals, complications of diabetes mellitus, hypoglycemia prevention and treatment, exercise, and nutrition.   Plan: The patient will continue to focus on protein-rich, low simple carbohydrate foods. We reviewed the importance of hydration, regular exercise for stress reduction, and restorative sleep.   Lab Results  Component Value Date   HGBA1C 8.5 (A) 07/30/2020   HGBA1C 7.9 (H) 01/08/2020   HGBA1C 8.7 (A) 09/11/2019   Lab Results  Component Value Date   MICROALBUR 150 08/25/2020    LDLCALC 63 08/25/2020   CREATININE 9.38 (H) 08/25/2020   - Start tirzepatide (MOUNJARO) 2.5 MG/0.5ML Pen; Inject 2.5 mg into the skin once a week.  Dispense: 2 mL; Refill: 0  2. Hyperlipidemia associated with type 2 diabetes mellitus ([place]) Course: Not at goal. Lipid-lowering medications: Crestor 20 mg daily.   Plan: Dietary changes: Increase soluble fiber, decrease simple carbohydrates, decrease saturated fat. Exercise changes: Moderate to vigorous-intensity aerobic activity 150 minutes per week or as tolerated. We will continue to monitor along with PCP/specialists as it pertains to her weight loss journey.  Lab Results  Component Value Date   CHOL 183 08/25/2020   HDL 39 (L) 08/25/2020   LDLCALC 63 08/25/2020   TRIG 536 (H) 08/25/2020   CHOLHDL 4.7 (H) 08/25/2020   Lab Results  Component Value Date   ALT 35 (H) 08/25/2020   AST 23 08/25/2020   ALKPHOS 59 08/25/2020   BILITOT 0.4 08/25/2020   The 10-year ASCVD risk score Mikey Bussing DC Jr., et al., 2013) is: 2.7%   Values used to calculate the score:     Age: 50 years     Sex: Female     Is Non-Hispanic African American: Yes     Diabetic: Yes     Tobacco smoker: No     Systolic Blood Pressure: 562 mmHg     Is BP treated: No     HDL Cholesterol: 39 mg/dL     Total Cholesterol: 183 mg/dL  3. NAFLD (nonalcoholic fatty liver disease) NAFLD is an umbrella term that encompasses a disease spectrum that includes steatosis (fat)  without inflammation, steatohepatitis (NASH; fat + inflammation in a characteristic pattern), and cirrhosis. Bland steatosis is felt to be a benign condition, with extremely low to no risk of progression to cirrhosis, whereas NASH can progress to cirrhosis. The mainstay of treatment of NAFLD includes lifestyle modification to achieve weight loss, at least 7% of current body weight. Low carbohydrate diets can be beneficial in improving NAFLD liver histology. Additionally, exercise, even the absence of weight loss can  have beneficial effects on the patient's metabolic profile and liver health.   4. At risk for heart disease Due to Cassandra Campos's current state of health and medical condition(s), she is at a higher risk for heart disease.  This puts the patient at much greater risk to subsequently develop cardiopulmonary conditions that can significantly affect patient's quality of life in a negative manner.    At least 8 minutes were spent on counseling Cassandra Campos about these concerns today. Evidence-based interventions for health behavior change were utilized today including the discussion of self monitoring techniques, problem-solving barriers, and SMART goal setting techniques.  Specifically, regarding patient's less desirable eating habits and patterns, we employed the technique of small changes when Cassandra Campos has not been able to fully commit to her prudent nutritional plan.  5. Obesity, current BMI 40.3  Course: Cassandra Campos is currently in the action stage of change. As such, her goal is to continue with weight loss efforts.   Nutrition goals: She has agreed to keeping a food journal and adhering to recommended goals of 1200-1500 calories and 95 grams of protein.   Exercise goals:  As is.  Behavioral modification strategies: increasing lean protein intake, decreasing sodium intake, and better snacking choices.  Cassandra Campos has agreed to follow-up with our clinic in 2-3 weeks. She was informed of the importance of frequent follow-up visits to maximize her success with intensive lifestyle modifications for her multiple health conditions.   Objective:   Blood pressure 104/67, pulse 77, temperature 97.9 F (36.6 C), temperature source Oral, height 5\' 3"  (1.6 m), weight 227 lb (103 kg), SpO2 93 %. Body mass index is 40.21 kg/m.  General: Cooperative, alert, well developed, in no acute distress. HEENT: Conjunctivae and lids unremarkable. Cardiovascular: Regular rhythm.  Lungs: Normal work of breathing. Neurologic: No  focal deficits.   Lab Results  Component Value Date   CREATININE 9.38 (H) 08/25/2020   BUN 60 (H) 08/25/2020   NA 144 08/25/2020   K 3.9 08/25/2020   CL 99 08/25/2020   CO2 19 (L) 08/25/2020   Lab Results  Component Value Date   ALT 35 (H) 08/25/2020   AST 23 08/25/2020   ALKPHOS 59 08/25/2020   BILITOT 0.4 08/25/2020   Lab Results  Component Value Date   HGBA1C 8.5 (A) 07/30/2020   HGBA1C 7.9 (H) 01/08/2020   HGBA1C 8.7 (A) 09/11/2019   HGBA1C 10.9 (H) 04/19/2019   HGBA1C 6.9 (H) 11/09/2018   Lab Results  Component Value Date   TSH 1.110 08/25/2020   Lab Results  Component Value Date   CHOL 183 08/25/2020   HDL 39 (L) 08/25/2020   LDLCALC 63 08/25/2020   TRIG 536 (H) 08/25/2020   CHOLHDL 4.7 (H) 08/25/2020   Lab Results  Component Value Date   WBC 6.5 08/25/2020   HGB 10.9 (L) 08/25/2020   HCT 31.9 (L) 08/25/2020   MCV 94 08/25/2020   PLT 167 08/25/2020   Lab Results  Component Value Date   IRON 56 03/15/2017   IRON 56 03/15/2017  FERRITIN 1,500 (H) 08/25/2020   Attestation Statements:   Reviewed by clinician on day of visit: allergies, medications, problem list, medical history, surgical history, family history, social history, and previous encounter notes.  I, Water quality scientist, CMA, am acting as transcriptionist for Briscoe Deutscher, DO  I have reviewed the above documentation for accuracy and completeness, and I agree with the above. Briscoe Deutscher, DO

## 2020-12-11 ENCOUNTER — Ambulatory Visit (INDEPENDENT_AMBULATORY_CARE_PROVIDER_SITE_OTHER): Payer: Medicare Other | Admitting: Nurse Practitioner

## 2020-12-11 ENCOUNTER — Other Ambulatory Visit: Payer: Self-pay

## 2020-12-11 ENCOUNTER — Encounter: Payer: Self-pay | Admitting: Nurse Practitioner

## 2020-12-11 VITALS — BP 120/74 | HR 114 | Temp 103.1°F

## 2020-12-11 DIAGNOSIS — R3129 Other microscopic hematuria: Secondary | ICD-10-CM | POA: Diagnosis not present

## 2020-12-11 DIAGNOSIS — R509 Fever, unspecified: Secondary | ICD-10-CM

## 2020-12-11 DIAGNOSIS — M545 Low back pain, unspecified: Secondary | ICD-10-CM

## 2020-12-11 DIAGNOSIS — R413 Other amnesia: Secondary | ICD-10-CM | POA: Diagnosis not present

## 2020-12-11 DIAGNOSIS — R5383 Other fatigue: Secondary | ICD-10-CM | POA: Diagnosis not present

## 2020-12-11 LAB — POCT URINALYSIS DIPSTICK
Bilirubin, UA: NEGATIVE
Glucose, UA: NEGATIVE
Nitrite, UA: NEGATIVE
Protein, UA: POSITIVE — AB
Spec Grav, UA: >=1.03 — AB (ref 1.010–1.025)
Urobilinogen, UA: 0.2 E.U./dL
pH, UA: 5 (ref 5.0–8.0)

## 2020-12-11 LAB — POC COVID19 BINAXNOW: SARS Coronavirus 2 Ag: NEGATIVE

## 2020-12-11 MED ORDER — CEFTRIAXONE SODIUM 1 G IJ SOLR
1.0000 g | Freq: Once | INTRAMUSCULAR | Status: AC
  Administered 2020-12-11: 1 g via INTRAMUSCULAR

## 2020-12-11 NOTE — Patient Instructions (Signed)

## 2020-12-11 NOTE — Progress Notes (Signed)
I,Tianna Badgett,acting as a Education administrator for [employer], FNP.,have documented all relevant documentation on the behalf of Minette Brine, FNP,as directed by  Minette Brine, FNP while in the presence of Minette Brine, [place].  This visit occurred during the SARS-CoV-2 public health emergency.  Safety protocols were in place, including screening questions prior to the visit, additional usage of staff PPE, and extensive cleaning of exam room while observing appropriate contact time as indicated for disinfecting solutions.  Subjective:     Patient ID: Cassandra Campos , female    DOB: Feb 05, 1971 , 50 y.o.   MRN: [id number]   Chief Complaint  Patient presents with   Fatigue   Back Pain    HPI  She is having back pain on the left side for the last week. She is unsure if the pain is coming from when she is sitting. She has also had fatigue. She is on dialysis MT, Th and Fri. She is having fatigue the last week and is affecting her at work. She has twitches from lyrica. Denies any other symptoms of runny nose, cough or congestion. Reports she could not remember the steps to do her job. Does use her cpap. Her symptoms are affecting her job, works at [employer] with authorizations    Past Medical History:  Diagnosis Date   Anemia associated with chronic renal failure    ASCUS of cervix with negative high risk HPV 06/2017   Back pain    Chest pain    CHF (congestive heart failure) ([place])    CKD (chronic kidney disease), stage IV ([place]) no dialysis yet   nephrologist-  dr Jamal Maes-- scheduled next visit 09-08-2017 (lov 07-05-2017)   Constipation    Diabetic retinopathy ([place])    Dialysis patient ([place])    Edema, lower extremity    Elevated transaminase level    Family history of breast cancer    Family history of ovarian cancer    Family history of stomach cancer    Fatty liver    FOLLOWED BY DR Henrene Pastor   GERD (gastroesophageal reflux disease)    Hypertension    Idiopathic gout of  multiple sites    09-05-2017 per pt stable   Insulin dependent type 2 diabetes mellitus ([place])    followed by dr [facility]   Joint pain    Mixed hyperlipidemia    OSA (obstructive sleep apnea)    per study 10-20-2015 mild osa w/ AHI 10.3/hr;  09-05-2017 per pt has not used cpap since 1 yr ago   Palpitations    Peripheral neuropathy    feet   S/P arteriovenous (AV) fistula creation 07-04-2015  w/ revision 02-14-2017   left braniocephilic   Shortness of breath    Trigger thumb, right thumb    Umbilical hernia    Vitamin D deficiency      Family History  Problem Relation Age of Onset   Diabetes Mother    Hypertension Mother    Thyroid disease Mother    Ovarian cancer Mother        dx 15s   Diabetes Father    Hypertension Father    Cancer Father        STOMACH   Stomach cancer Father 45   Stroke Maternal Grandmother    Stroke Maternal Grandfather    Breast cancer Maternal Aunt 69   Cancer Paternal Grandfather        unknown type, mets to brain   Cancer Other  unknown type, father's first cousin   Cancer Paternal Uncle        unknown type, dx >50   Cancer Cousin        unknown type, dx 37s, paternal first cousin   Cancer Other        unknown types, 4 of mother's first cousins   Colon cancer Neg Hx    Rectal cancer Neg Hx      Current Outpatient Medications:    albuterol (VENTOLIN HFA) 108 (90 Base) MCG/ACT inhaler, Inhale 2 puffs into the lungs every 6 (six) hours as needed for wheezing or shortness of breath., Disp: 8 g, Rfl: 2   allopurinol (ZYLOPRIM) 300 MG tablet, TAKE 1 TABLET BY MOUTH EVERY MORNING, Disp: 90 tablet, Rfl: 1   B Complex-C-Folic Acid (RENAL) 1 MG CAPS, daily at 2 am., Disp: , Rfl:    benzonatate (TESSALON PERLES) 100 MG capsule, Take 1 capsule (100 mg total) by mouth every 6 (six) hours as needed., Disp: 30 capsule, Rfl: 1   cinacalcet (SENSIPAR) 60 MG tablet, Take 60 mg by mouth daily. Every other day, Disp: , Rfl:    Continuous Blood Gluc  Receiver (FREESTYLE LIBRE 2 READER) DEVI, 1 each by Does not apply route daily., Disp: 1 each, Rfl: 0   Continuous Blood Gluc Sensor (FREESTYLE LIBRE 2 SENSOR) MISC, 1 each by Does not apply route every 14 (fourteen) days., Disp: 6 each, Rfl: 3   Insulin Pen Needle 32G X 4 MM MISC, Use 2x a day, Disp: 200 each, Rfl: 3   insulin regular human CONCENTRATED (HUMULIN R U-500 KWIKPEN) 500 UNIT/ML kwikpen, Inject 200 units before breakfast 160-170 units before dinner, Disp: 16 mL, Rfl: 11   lanthanum (FOSRENOL) 1000 MG chewable tablet, Chew 1,000 mg by mouth 3 (three) times daily with meals., Disp: , Rfl:    levofloxacin (LEVAQUIN) 500 MG tablet, Take 1 tablet (500 mg total) by mouth every other day., Disp: 5 tablet, Rfl: 0   lidocaine-prilocaine (EMLA) cream, USE ON [place] AS NEEDED FOR DIALYSIS, Disp: , Rfl: 10   Microlet Lancets MISC, Use as directed to check blood sugars 4 times per day dx: e11.22, Disp: 300 each, Rfl: 2   MITIGARE 0.6 MG CAPS, Monday, Wednesday, and Friday, Disp: 90 capsule, Rfl: 0   ondansetron (ZOFRAN) 4 MG tablet, Take 1 tablet (4 mg total) by mouth daily as needed for nausea or vomiting., Disp: 30 tablet, Rfl: 1   pregabalin (LYRICA) 100 MG capsule, Take 1 capsule (100 mg total) by mouth daily. In the morning, Disp: 90 capsule, Rfl: 1   pregabalin (LYRICA) 75 MG capsule, Take 1 capsule (75 mg total) by mouth daily. At bedtime, Disp: 90 capsule, Rfl: 1   RESTASIS 0.05 % ophthalmic emulsion, INSTILL 1 DROP INTO BOTH EYES TWICE A DAY, Disp: , Rfl: 3   rosuvastatin (CRESTOR) 20 MG tablet, TAKE 1 TABLET BY MOUTH EVERY DAY, Disp: 90 tablet, Rfl: 3   No Known Allergies   Review of Systems  Constitutional: Negative.   Respiratory: Negative.    Cardiovascular: Negative.   Gastrointestinal: Negative.   Musculoskeletal:  Positive for back pain. Negative for neck pain.  Neurological: Negative.  Negative for headaches.    Today's Vitals   12/11/20 1601  BP: 120/74  Pulse: (!) 114   Temp: (!) 103.1 F (39.5 C)  TempSrc: Oral   There is no height or weight on file to calculate BMI.   Objective:  Physical Exam Vitals reviewed.  Constitutional:      General: She is not in acute distress.    Appearance: She is well-developed. She is obese.     Comments: Appears ill  HENT:     Head: Normocephalic and atraumatic.  Eyes:     Pupils: Pupils are equal, round, and reactive to light.  Cardiovascular:     Rate and Rhythm: Normal rate and regular rhythm.     Pulses: Normal pulses.     Heart sounds: Normal heart sounds. No murmur heard. Pulmonary:     Effort: Pulmonary effort is normal. No respiratory distress.     Breath sounds: Normal breath sounds. No wheezing.  Musculoskeletal:        General: Normal range of motion.  Skin:    General: Skin is warm and dry.     Capillary Refill: Capillary refill takes less than 2 seconds.  Neurological:     General: No focal deficit present.     Mental Status: She is alert and oriented to person, place, and time.     Cranial Nerves: No cranial nerve deficit.     Motor: No weakness.  Psychiatric:        Mood and Affect: Mood normal.        Behavior: Behavior normal.        Thought Content: Thought content normal.        Judgment: Judgment normal.        Assessment And Plan:     1. Fatigue, unspecified type Comments: Will check for metabolic causes - POC UORVI-15 - Novel Coronavirus, NAA ([facility]) - CBC - CMP14+EGFR - Vitamin B12 - Iron, TIBC and Ferritin Panel  2. Bilateral low back pain without sciatica, unspecified chronicity Comments: Encouraged to stretch when possible.   3. Memory changes Comments: Unsure if this is related to her current illness of fatigue vs metabolic causes. - Vitamin B12  4. Fever, unspecified fever cause Comments: Will treat with ceftriaxone and send for CXR - cefTRIAXone (ROCEPHIN) injection 1 g - DG Chest 2 View; Future - Culture, Urine  5. Other microscopic  hematuria Comments: Moderate blood urine, will send for uirne culture - Culture, Urine - POCT Urinalysis Dipstick ([id number])    Patient was given opportunity to ask questions. Patient verbalized understanding of the plan and was able to repeat key elements of the plan. All questions were answered to their satisfaction.  Minette Brine, FNP   I, Minette Brine, FNP, have reviewed all documentation for this visit. The documentation on 12/28/20 for the exam, diagnosis, procedures, and orders are all accurate and complete.   IF YOU HAVE BEEN REFERRED TO A SPECIALIST, IT MAY TAKE 1-2 WEEKS TO SCHEDULE/PROCESS THE REFERRAL. IF YOU HAVE NOT HEARD FROM US/SPECIALIST IN TWO WEEKS, PLEASE GIVE [place] A CALL AT [phone].   THE PATIENT IS ENCOURAGED TO PRACTICE SOCIAL DISTANCING DUE TO THE COVID-19 PANDEMIC.

## 2020-12-12 ENCOUNTER — Ambulatory Visit
Admission: RE | Admit: 2020-12-12 | Discharge: 2020-12-12 | Disposition: A | Discharge status: Home / Self Care | Payer: PRIVATE HEALTH INSURANCE | Source: Ambulatory Visit | Attending: Nurse Practitioner | Admitting: Nurse Practitioner

## 2020-12-12 DIAGNOSIS — R509 Fever, unspecified: Secondary | ICD-10-CM

## 2020-12-12 LAB — SARS-COV-2, NAA 2 DAY TAT

## 2020-12-12 LAB — NOVEL CORONAVIRUS, NAA: SARS-CoV-2, NAA: NOT DETECTED

## 2020-12-13 LAB — URINE CULTURE

## 2020-12-14 LAB — CMP14+EGFR
ALT: 27 IU/L (ref 0–32)
AST: 28 IU/L (ref 0–40)
Albumin/Globulin Ratio: 1.5 (ref 1.2–2.2)
Albumin: 4.8 g/dL (ref 3.8–4.8)
Alkaline Phosphatase: 76 IU/L (ref 44–121)
BUN/Creatinine Ratio: 4 — ABNORMAL LOW (ref 9–23)
BUN: 53 mg/dL — ABNORMAL HIGH (ref 6–24)
Bilirubin Total: 0.4 mg/dL (ref 0.0–1.2)
CO2: 23 mmol/L (ref 20–29)
Calcium: 10.9 mg/dL — ABNORMAL HIGH (ref 8.7–10.2)
Chloride: 90 mmol/L — ABNORMAL LOW (ref 96–106)
Creatinine, Ser: 12.08 mg/dL — ABNORMAL HIGH (ref 0.57–1.00)
Globulin, Total: 3.1 g/dL (ref 1.5–4.5)
Glucose: 102 mg/dL — ABNORMAL HIGH (ref 65–99)
Potassium: 4.7 mmol/L (ref 3.5–5.2)
Sodium: 134 mmol/L (ref 134–144)
Total Protein: 7.9 g/dL (ref 6.0–8.5)
eGFR: 3 mL/min/{1.73_m2} — ABNORMAL LOW (ref >59)

## 2020-12-14 LAB — IRON,TIBC AND FERRITIN PANEL
Ferritin: 3133 ng/mL — ABNORMAL HIGH (ref 15–150)
Iron Saturation: 12 % — ABNORMAL LOW (ref 15–55)
Iron: 32 ug/dL (ref 27–159)
Total Iron Binding Capacity: 276 ug/dL (ref 250–450)
UIBC: 244 ug/dL (ref 131–425)

## 2020-12-14 LAB — CBC
Hematocrit: 33.4 % — ABNORMAL LOW (ref 34.0–46.6)
Hemoglobin: 11.3 g/dL (ref 11.1–15.9)
MCH: 30.8 pg (ref 26.6–33.0)
MCHC: 33.8 g/dL (ref 31.5–35.7)
MCV: 91 fL (ref 79–97)
Platelets: 152 10*3/uL (ref 150–450)
RBC: 3.67 x10E6/uL — ABNORMAL LOW (ref 3.77–5.28)
RDW: 13.3 % (ref 11.7–15.4)
WBC: 4.3 10*3/uL (ref 3.4–10.8)

## 2020-12-14 LAB — VITAMIN B12: Vitamin B-12: 906 pg/mL (ref 232–1245)

## 2020-12-15 ENCOUNTER — Encounter (INDEPENDENT_AMBULATORY_CARE_PROVIDER_SITE_OTHER): Payer: Self-pay | Admitting: Family Medicine

## 2020-12-15 ENCOUNTER — Ambulatory Visit (INDEPENDENT_AMBULATORY_CARE_PROVIDER_SITE_OTHER): Payer: Medicare Other | Admitting: Family Medicine

## 2020-12-15 ENCOUNTER — Other Ambulatory Visit: Payer: Self-pay

## 2020-12-15 VITALS — BP 86/53 | HR 84 | Temp 99.0°F | Ht 63.0 in | Wt 217.0 lb

## 2020-12-15 DIAGNOSIS — N186 End stage renal disease: Secondary | ICD-10-CM

## 2020-12-15 DIAGNOSIS — Z992 Dependence on renal dialysis: Secondary | ICD-10-CM

## 2020-12-15 DIAGNOSIS — E1122 Type 2 diabetes mellitus with diabetic chronic kidney disease: Secondary | ICD-10-CM

## 2020-12-15 DIAGNOSIS — Z6841 Body Mass Index (BMI) 40.0 and over, adult: Secondary | ICD-10-CM | POA: Diagnosis not present

## 2020-12-15 DIAGNOSIS — Z794 Long term (current) use of insulin: Secondary | ICD-10-CM

## 2020-12-17 ENCOUNTER — Encounter: Payer: Self-pay | Admitting: Nurse Practitioner

## 2020-12-17 NOTE — Progress Notes (Signed)
Chief Complaint:   OBESITY Cassandra Campos is here to discuss her progress with her obesity treatment plan along with follow-up of her obesity related diagnoses. Cassandra Campos is on keeping a food journal and adhering to recommended goals of 1200-1500 calories and 95 grams of protein daily and states she is following her eating plan approximately 100% of the time. Cassandra Campos states she is doing for 15-20 minutes 4 times per week.  Today's visit was #: 9 Starting weight: 228 lbs Starting date: 08/30/2019 Today's weight: 217 lbs Today's date: 12/15/2020 Total lbs lost to date: 11 Total lbs lost since last in-office visit: 10  Interim History: Cassandra Campos has to lose 20 more lbs to quality for her kidney transplant. She has an appointment at [facility] in October 2022. She has adhered to the plan very well. She notes she is feeling full more quickly. Her water intake is low. She is allowed to have 32 oz per day but is not drinking that much.  Subjective:   1. Type 2 diabetes mellitus with chronic kidney disease on chronic dialysis, with long-term current use of insulin ([place]) DM is not well controlled, and her last A1c was 8.5. She has some hypoglycemia in the last few weeks. She does skip breakfast at times, and this leads to hypoglycemia (53-54). She has highs of up to low 300's. Her insulin is on sliding scale. She sees Endocrinology. She is on Ozempic and insulin. She drinks apple juice for hypoglycemia. Mounjaro prescribed at last OV but she was unable to start due to cost.   Lab Results  Component Value Date   HGBA1C 8.5 (A) 07/30/2020   HGBA1C 7.9 (H) 01/08/2020   HGBA1C 8.7 (A) 09/11/2019   Lab Results  Component Value Date   MICROALBUR 150 08/25/2020   LDLCALC 63 08/25/2020   CREATININE 12.08 (H) 12/11/2020   No results found for: INSULIN  Assessment/Plan:   1. Type 2 diabetes mellitus with chronic kidney disease on chronic dialysis, with long-term current use of insulin ([facility]) Cassandra Campos is to buy  glucose tablets and follow handout instructions for hypoglycemia. She is to stop Ozempic and began [facility] one week after her last Ozempic injection. No skipping meals.   2. Obesity, current BMI 40.3 Cassandra Campos is currently in the action stage of change. As such, her goal is to continue with weight loss efforts. She has agreed to following a lower carbohydrate, vegetable and lean protein rich diet plan.   A coupon was provided for [facility]. She is to increase her water to 32 oz per day. Handout on hypoglycemia and Egg muffin recipe was given today.  Exercise goals: As is.  Behavioral modification strategies: increasing lean protein intake, increasing water intake, and decreasing liquid calories.  Phynix has agreed to follow-up with our clinic in 3 weeks.  Objective:   Blood pressure (!) 86/53, pulse 84, temperature 99 F (37.2 C), height 5\' 3"  (1.6 m), weight 217 lb (98.4 kg), SpO2 99 %. Body mass index is 38.44 kg/m.  General: Cooperative, alert, well developed, in no acute distress. HEENT: Conjunctivae and lids unremarkable. Cardiovascular: Regular rhythm.  Lungs: Normal work of breathing. Neurologic: No focal deficits.   Lab Results  Component Value Date   CREATININE 12.08 (H) 12/11/2020   BUN 53 (H) 12/11/2020   NA 134 12/11/2020   K 4.7 12/11/2020   CL 90 (L) 12/11/2020   CO2 23 12/11/2020   Lab Results  Component Value Date   ALT 27 12/11/2020   AST  28 12/11/2020   ALKPHOS 76 12/11/2020   BILITOT 0.4 12/11/2020   Lab Results  Component Value Date   HGBA1C 8.5 (A) 07/30/2020   HGBA1C 7.9 (H) 01/08/2020   HGBA1C 8.7 (A) 09/11/2019   HGBA1C 10.9 (H) 04/19/2019   HGBA1C 6.9 (H) 11/09/2018   No results found for: INSULIN Lab Results  Component Value Date   TSH 1.110 08/25/2020   Lab Results  Component Value Date   CHOL 183 08/25/2020   HDL 39 (L) 08/25/2020   LDLCALC 63 08/25/2020   TRIG 536 (H) 08/25/2020   CHOLHDL 4.7 (H) 08/25/2020   No results found  for: VD25OH Lab Results  Component Value Date   WBC 4.3 12/11/2020   HGB 11.3 12/11/2020   HCT 33.4 (L) 12/11/2020   MCV 91 12/11/2020   PLT 152 12/11/2020   Lab Results  Component Value Date   IRON 32 12/11/2020   TIBC 276 12/11/2020   FERRITIN 3,133 (H) 12/11/2020   Attestation Statements:   Reviewed by clinician on day of visit: allergies, medications, problem list, medical history, surgical history, family history, social history, and previous encounter notes.   Wilhemena Durie, am acting as Location manager for [employer], FNP-C.  I have reviewed the above documentation for accuracy and completeness, and I agree with the above. -  Georgianne Fick, FNP

## 2020-12-18 ENCOUNTER — Encounter (INDEPENDENT_AMBULATORY_CARE_PROVIDER_SITE_OTHER): Payer: Self-pay | Admitting: Family Medicine

## 2020-12-18 ENCOUNTER — Other Ambulatory Visit: Payer: Self-pay

## 2020-12-18 ENCOUNTER — Emergency Department (HOSPITAL_BASED_OUTPATIENT_CLINIC_OR_DEPARTMENT_OTHER): Payer: Medicare Other | Admitting: Radiology

## 2020-12-18 ENCOUNTER — Emergency Department (HOSPITAL_BASED_OUTPATIENT_CLINIC_OR_DEPARTMENT_OTHER)
Admission: EM | Admit: 2020-12-18 | Discharge: 2020-12-19 | Disposition: A | Discharge status: Home / Self Care | Payer: Medicare Other | Attending: Emergency Medicine | Admitting: Emergency Medicine

## 2020-12-18 ENCOUNTER — Encounter (HOSPITAL_BASED_OUTPATIENT_CLINIC_OR_DEPARTMENT_OTHER): Payer: Self-pay

## 2020-12-18 ENCOUNTER — Other Ambulatory Visit: Payer: Self-pay | Admitting: Nurse Practitioner

## 2020-12-18 DIAGNOSIS — N186 End stage renal disease: Secondary | ICD-10-CM | POA: Diagnosis not present

## 2020-12-18 DIAGNOSIS — J189 Pneumonia, unspecified organism: Secondary | ICD-10-CM | POA: Diagnosis not present

## 2020-12-18 DIAGNOSIS — R899 Unspecified abnormal finding in specimens from other organs, systems and tissues: Secondary | ICD-10-CM

## 2020-12-18 DIAGNOSIS — E785 Hyperlipidemia, unspecified: Secondary | ICD-10-CM | POA: Insufficient documentation

## 2020-12-18 DIAGNOSIS — Z794 Long term (current) use of insulin: Secondary | ICD-10-CM | POA: Insufficient documentation

## 2020-12-18 DIAGNOSIS — R5383 Other fatigue: Secondary | ICD-10-CM

## 2020-12-18 DIAGNOSIS — Z992 Dependence on renal dialysis: Secondary | ICD-10-CM | POA: Diagnosis not present

## 2020-12-18 DIAGNOSIS — Z20822 Contact with and (suspected) exposure to covid-19: Secondary | ICD-10-CM | POA: Insufficient documentation

## 2020-12-18 DIAGNOSIS — I509 Heart failure, unspecified: Secondary | ICD-10-CM | POA: Diagnosis not present

## 2020-12-18 DIAGNOSIS — E1159 Type 2 diabetes mellitus with other circulatory complications: Secondary | ICD-10-CM | POA: Diagnosis not present

## 2020-12-18 DIAGNOSIS — E1169 Type 2 diabetes mellitus with other specified complication: Secondary | ICD-10-CM | POA: Insufficient documentation

## 2020-12-18 DIAGNOSIS — E1122 Type 2 diabetes mellitus with diabetic chronic kidney disease: Secondary | ICD-10-CM | POA: Diagnosis not present

## 2020-12-18 DIAGNOSIS — I132 Hypertensive heart and chronic kidney disease with heart failure and with stage 5 chronic kidney disease, or end stage renal disease: Secondary | ICD-10-CM | POA: Diagnosis not present

## 2020-12-18 DIAGNOSIS — R898 Other abnormal findings in specimens from other organs, systems and tissues: Secondary | ICD-10-CM | POA: Insufficient documentation

## 2020-12-18 DIAGNOSIS — Z79899 Other long term (current) drug therapy: Secondary | ICD-10-CM | POA: Insufficient documentation

## 2020-12-18 DIAGNOSIS — R509 Fever, unspecified: Secondary | ICD-10-CM | POA: Diagnosis present

## 2020-12-18 DIAGNOSIS — E1142 Type 2 diabetes mellitus with diabetic polyneuropathy: Secondary | ICD-10-CM | POA: Insufficient documentation

## 2020-12-18 MED ORDER — BENZONATATE 100 MG PO CAPS: 100.0000 mg | ORAL_CAPSULE | Freq: Four times a day (QID) | ORAL | 1 refills | Status: DC | PRN

## 2020-12-18 NOTE — ED Notes (Signed)
Patient transported to X-ray 

## 2020-12-18 NOTE — ED Provider Notes (Signed)
[facility] EMERGENCY DEPT Provider Note   CSN: [id number] Arrival date & time: 12/18/20  2321     History Chief Complaint  Patient presents with   Abnormal Labs    [facility] Cassandra Campos is a 50 y.o. female.  Patient with a history of ESRD on home hemodialysis Monday, Tuesday, Thursday, Friday, diabetes, hypertension presenting with abnormal labs.  States her home health dialysis nurse checked her blood samples on August 1.  She got a call tonight stating to come to the hospital because her potassium was 7.8 and her liver enzymes are elevated.  Patient states she feels well but generally weak.  She denies any difficulty breathing or chest pain.  She denies any abdominal pain, nausea or vomiting.  Denies any missed dialysis sessions.  Has not made any urine for the past 1 week.  Has had intermittent fevers since July 28.  She saw her PCP and had a negative COVID test.  States she had a fever with dialysis at least twice in the past week.  Did take some Tylenol prior to arrival.  She has had a cough and some runny nose but no sore throat or headache.  No body aches.  No abdominal pain.  No chest pain or shortness of breath.  Denies any missed dialysis sessions. States she would not be here if she was not told to come by her nurse.  The history is provided by the patient.      Past Medical History:  Diagnosis Date   Anemia associated with chronic renal failure    ASCUS of cervix with negative high risk HPV 06/2017   Back pain    Chest pain    CHF (congestive heart failure) ([place])    CKD (chronic kidney disease), stage IV ([place]) no dialysis yet   nephrologist-  dr Jamal Maes-- scheduled next visit 09-08-2017 (lov 07-05-2017)   Constipation    Diabetic retinopathy ([place])    Dialysis patient ([place])    Edema, lower extremity    Elevated transaminase level    Family history of breast cancer    Family history of ovarian cancer    Family history of stomach cancer     Fatty liver    FOLLOWED BY DR Henrene Pastor   GERD (gastroesophageal reflux disease)    Hypertension    Idiopathic gout of multiple sites    09-05-2017 per pt stable   Insulin dependent type 2 diabetes mellitus ([place])    followed by dr Toy Care   Joint pain    Mixed hyperlipidemia    OSA (obstructive sleep apnea)    per study 10-20-2015 mild osa w/ AHI 10.3/hr;  09-05-2017 per pt has not used cpap since 1 yr ago   Palpitations    Peripheral neuropathy    feet   S/P arteriovenous (AV) fistula creation 07-04-2015  w/ revision 02-14-2017   left braniocephilic   Shortness of breath    Trigger thumb, right thumb    Umbilical hernia    Vitamin D deficiency     Patient Active Problem List   Diagnosis Date Noted   Genetic testing 10/01/2020   Family history of breast cancer    Family history of stomach cancer    Family history of ovarian cancer    Hypersomnia, persistent 10/09/2019   OSA on CPAP 10/09/2019   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   Hypertension associated with diabetes ([place]) 09/08/2019   Hyperlipidemia associated with type 2 diabetes mellitus ([place]) 09/08/2019   Excessive daytime sleepiness 09/08/2019  NAFLD (nonalcoholic fatty liver disease) 09/08/2019   Class 3 severe obesity with serious comorbidity and body mass index (BMI) of 40.0 to 44.9 in adult [facility]) 09/08/2019   Morbid obesity ([place]) 08/31/2018   Anemia of chronic kidney failure, stage 5 ([place]) 08/31/2018   OSA (obstructive sleep apnea) 08/31/2018   ESRD on hemodialysis ([place]) 08/31/2018   Palpitations 07/04/2018   Atypical chest pain 05/18/2018   Poor compliance with CPAP treatment 08/02/2016   Acute idiopathic gout of multiple sites 05/13/2016   Bacterial vaginosis 10/11/2012   Type 2 diabetes mellitus with chronic kidney disease on chronic dialysis, with long-term current use of insulin (HCC)    High triglycerides     Past Surgical History:  Procedure Laterality Date   ABDOMINAL HYSTERECTOMY  07/11/2000   dr  gott   TAH/BSO for irregular bleeding and leiomyoma   AV FISTULA PLACEMENT Left 07/04/2015   Procedure: ARTERIOVENOUS (AV) FISTULA CREATION-LEFT;  Surgeon: Mal Misty, MD;  Location: [facility];  Service: Vascular;  Laterality: Left;   BREAST EXCISIONAL BIOPSY Right    benign   CARPAL TUNNEL RELEASE Bilateral 1993 and [place]:  09-25-1999   BILATERAL TUBAL LIGATION W/ LAST C/S   DILATION AND CURETTAGE OF UTERUS     EXPLORATORY [place] / LYSIS ADHESIONS/  BILATERAL SALPINGOOPHORECTOMY  07-20-2011  dr s. Rhodia Albright  [facility]   FISTULA SUPERFICIALIZATION Left 08/28/2015   Procedure: BRACHIOCEPHALIC FISTULA SUPERFICIALIZATION;  Surgeon: Mal Misty, MD;  Location: [place];  Service: Vascular;  Laterality: Left;   PATCH ANGIOPLASTY Left 02/14/2017   Procedure: PATCH ANGIOPLASTY;  Surgeon: Elam Dutch, MD;  Location: [place];  Service: Vascular;  Laterality: Left;   PELVIC LAPAROSCOPY  1994   exploration for ectopic preg.   RETINAL DETACHMENT SURGERY Left 2015   REVISON OF ARTERIOVENOUS FISTULA Left 02/14/2017   Procedure: REVISON OF ARTERIOVENOUS FISTULA  LEFT ARM;  Surgeon: Elam Dutch, MD;  Location: [place];  Service: Vascular;  Laterality: Left;   TRIGGER FINGER RELEASE Left 2015   thumb   TRIGGER FINGER RELEASE Right 09/09/2017   Procedure: RELEASE TRIGGER FINGER/A-1 PULLEY RIGHT THUMB;  Surgeon: Dorna Leitz, MD;  Location: [place];  Service: Orthopedics;  Laterality: Right;     OB History     Gravida  5   Para  2   Term  2   Preterm      AB  3   Living  2      SAB      IAB      Ectopic      Multiple      Live Births              Family History  Problem Relation Age of Onset   Diabetes Mother    Hypertension Mother    Thyroid disease Mother    Ovarian cancer Mother        dx 83s   Diabetes Father    Hypertension Father    Cancer Father        STOMACH   Stomach cancer Father 51   Stroke Maternal Grandmother     Stroke Maternal Grandfather    Breast cancer Maternal Aunt 6   Cancer Paternal Grandfather        unknown type, mets to brain   Cancer Other        unknown type, father's first cousin   Cancer Paternal Uncle  unknown type, dx >50   Cancer Cousin        unknown type, dx 15s, paternal first cousin   Cancer Other        unknown types, 4 of mother's first cousins   Colon cancer Neg Hx    Rectal cancer Neg Hx     Social History   Tobacco Use   Smoking status: Never   Smokeless tobacco: Never  Vaping Use   Vaping Use: Never used  Substance Use Topics   Alcohol use: Yes    Alcohol/week: 0.0 standard drinks    Comment: Very rare   Drug use: No    Home Medications Prior to Admission medications   Medication Sig Start Date End Date Taking? Authorizing Provider  allopurinol (ZYLOPRIM) 300 MG tablet TAKE 1 TABLET BY MOUTH EVERY MORNING 11/06/20   Glendale Chard, MD  B Complex-C-Folic Acid (RENAL) 1 MG CAPS daily at 2 am. 09/18/18   [provider]  benzonatate (TESSALON PERLES) 100 MG capsule Take 1 capsule (100 mg total) by mouth every 6 (six) hours as needed. 12/18/20 12/18/21  Minette Brine, FNP  cinacalcet (SENSIPAR) 60 MG tablet Take 60 mg by mouth daily. Every other day    [provider]  Continuous Blood Gluc Receiver (FREESTYLE LIBRE 2 READER) DEVI 1 each by Does not apply route daily. 07/30/20   Philemon Kingdom, MD  Continuous Blood Gluc Sensor (FREESTYLE LIBRE 2 SENSOR) MISC 1 each by Does not apply route every 14 (fourteen) days. 07/30/20   Philemon Kingdom, MD  Insulin Pen Needle 32G X 4 MM MISC Use 2x a day 09/11/19   Philemon Kingdom, MD  insulin regular human CONCENTRATED (HUMULIN R U-500 KWIKPEN) 500 UNIT/ML kwikpen Inject 200 units before breakfast 160-170 units before dinner 08/18/20   Philemon Kingdom, MD  lanthanum (FOSRENOL) 1000 MG chewable tablet Chew 1,000 mg by mouth 3 (three) times daily with meals.    [provider]   lidocaine-prilocaine (EMLA) cream USE ON [place] AS NEEDED FOR DIALYSIS 03/27/18   [provider]  Microlet Lancets MISC Use as directed to check blood sugars 4 times per day dx: e11.22 12/04/18   Glendale Chard, MD  MITIGARE 0.6 MG CAPS Monday, Wednesday, and Friday 10/30/20   Glendale Chard, MD  pregabalin (LYRICA) 100 MG capsule Take 1 capsule (100 mg total) by mouth daily. In the morning 08/27/20   Glendale Chard, MD  pregabalin (LYRICA) 75 MG capsule Take 1 capsule (75 mg total) by mouth daily. At bedtime 08/27/20   Glendale Chard, MD  RESTASIS 0.05 % ophthalmic emulsion INSTILL 1 DROP INTO BOTH EYES TWICE A DAY 03/14/18   [provider]  rosuvastatin (CRESTOR) 20 MG tablet TAKE 1 TABLET BY MOUTH EVERY DAY 04/15/20   Hilty, Nadean Corwin, MD  tirzepatide [facility]) 2.5 MG/0.5ML Pen Inject 2.5 mg into the skin once a week. Patient not taking: Reported on 12/15/2020 11/24/20 12/24/20  Briscoe Deutscher, DO    Allergies    Patient has no known allergies.  Review of Systems   Review of Systems  Constitutional:  Positive for activity change, appetite change and fever.  HENT:  Negative for congestion and rhinorrhea.   Respiratory:  Positive for cough. Negative for shortness of breath.   Cardiovascular:  Negative for chest pain and leg swelling.  Gastrointestinal:  Negative for abdominal pain, nausea and vomiting.  Genitourinary:  Negative for dysuria and hematuria.  Musculoskeletal:  Positive for arthralgias and myalgias.  Skin:  Negative for  rash.  Neurological:  Positive for weakness and light-headedness. Negative for dizziness and headaches.   all other systems are negative except as noted in the HPI and PMH.   Physical Exam Updated Vital Signs BP 137/74 (BP Location: Right Arm)   Pulse 100   Temp 99.7 F (37.6 C) (Oral)   Resp 16   Ht 5\' 3"  (1.6 m)   Wt 99.8 kg   SpO2 97%   BMI 38.97 kg/m   Physical Exam Vitals and nursing note reviewed.  Constitutional:       General: She is not in acute distress.    Appearance: She is well-developed. She is not ill-appearing.     Comments: No increased work of breathing. No dyspnea  HENT:     Head: Normocephalic and atraumatic.     Mouth/Throat:     Pharynx: No oropharyngeal exudate.  Eyes:     Conjunctiva/sclera: Conjunctivae normal.     Pupils: Pupils are equal, round, and reactive to light.  Neck:     Comments: No meningismus. Cardiovascular:     Rate and Rhythm: Normal rate and regular rhythm.     Heart sounds: Normal heart sounds. No murmur heard. Pulmonary:     Effort: Pulmonary effort is normal. No respiratory distress.     Breath sounds: Normal breath sounds.  Chest:     Chest wall: No tenderness.  Abdominal:     Palpations: Abdomen is soft.     Tenderness: There is no abdominal tenderness. There is no guarding or rebound.  Musculoskeletal:        General: No tenderness. Normal range of motion.     Cervical back: Normal range of motion and neck supple.     Comments: LUE fistula with thrill  Skin:    General: Skin is warm.  Neurological:     Mental Status: She is alert and oriented to person, place, and time.     Cranial Nerves: No cranial nerve deficit.     Motor: No abnormal muscle tone.     Coordination: Coordination normal.     Comments:  5/5 strength throughout. CN 2-12 intact.Equal grip strength.   Psychiatric:        Behavior: Behavior normal.    ED Results / Procedures / Treatments   Labs (all labs ordered are listed, but only abnormal results are displayed) Labs Reviewed  CBC WITH DIFFERENTIAL/PLATELET - Abnormal; Notable for the following components:      Result Value   RBC 3.33 (*)    Hemoglobin 10.4 (*)    HCT 30.3 (*)    All other components within normal limits  COMPREHENSIVE METABOLIC PANEL - Abnormal; Notable for the following components:   Sodium 133 (*)    Chloride 89 (*)    Glucose, Bld 113 (*)    BUN 23 (*)    Creatinine, Ser 7.83 (*)    GFR, Estimated 6  (*)    All other components within normal limits  LIPASE, BLOOD - Abnormal; Notable for the following components:   Lipase 83 (*)    All other components within normal limits  RESP PANEL BY RT-PCR (FLU A&B, COVID) [id number]  URINALYSIS, ROUTINE W REFLEX MICROSCOPIC    EKG EKG Interpretation  Date/Time:  Thursday December 18 2020 23:47:38 EDT Ventricular Rate:  102 PR Interval:  154 QRS Duration: 79 QT Interval:  328 QTC Calculation: 428 R Axis:   -6 Text Interpretation: Sinus tachycardia Low voltage, precordial leads Consider anterior infarct No significant change  was found Confirmed by Ezequiel Essex [phone]) on 12/19/2020 12:11:46 AM  Radiology DG Chest 2 View  Result Date: 12/19/2020 CLINICAL DATA:  Fever and cough EXAM: CHEST - 2 VIEW COMPARISON:  12/12/2020 FINDINGS: Patchy opacity at the left lung base. Lungs are otherwise clear. Normal cardiomediastinal contours. IMPRESSION: Patchy left basilar opacities, concerning for infection. Electronically Signed   By: Ulyses Jarred M.D.   On: 12/19/2020 00:21    Procedures Procedures   Medications Ordered in ED Medications - No data to display  ED Course  I have reviewed the triage vital signs and the nursing notes.  Pertinent labs & imaging results that were available during my care of the patient were reviewed by me and considered in my medical decision making (see chart for details).    MDM Rules/Calculators/A&P                          Dialysis patient sent with abnormal labs and concern for hyperkalemia and elevated LFTs.  She reports intermittent fevers for the past 1 week as well.  EKG is unchanged.  No hyperkalemic changes. Chest x-ray concerning for left basilar infiltrate.  COVID test will be repeated.  Labs today showed normal potassium and LFTs.  Potassium is 3.5.  Suspect her elevated values were likely hemolyzed or spurious.  COVID testing is negative.  Patient has no hypoxia or increased work of  breathing.  Patient able to ambulate with no hypoxia or increased work of breathing.  Discussed treating her for pneumonia.  Discussed with Charleston Ropes PharmD who recommends every other day Levaquin dosing.  Admission versus discharge discussed with patient.  She would prefer to go home.  She feels comfortable and has no difficulty breathing.  Minimal tachycardia but no hypoxia or increased work of breathing. Lactate is normal.  Blood cultures are sent.  Patient able to ambulate without desaturation. She is comfortable going home and knows to return if worse.  We will treat with Levaquin for suspected pneumonia. Return precautions discussed.  Ranada Vigorito was evaluated in Emergency Department on 12/18/2020 for the symptoms described in the history of present illness. She was evaluated in the context of the global COVID-19 pandemic, which necessitated consideration that the patient might be at risk for infection with the SARS-CoV-2 virus that causes COVID-19. Institutional protocols and algorithms that pertain to the evaluation of patients at risk for COVID-19 are in a state of rapid change based on information released by regulatory bodies including the CDC and federal and state organizations. These policies and algorithms were followed during the patient's care in the ED.  Final Clinical Impression(s) / ED Diagnoses Final diagnoses:  Healthcare-associated pneumonia  Abnormal laboratory test result    Rx / DC Orders ED Discharge Orders     None        Jensyn Cambria, Annie Main, MD 12/19/20 [phone]

## 2020-12-18 NOTE — ED Triage Notes (Signed)
Patient here POV from Home with Hyperkalemia and elevated Liver Enzymes.  Patient states her Dialysis Nurse checked her Blood Samples this past Monday and Nurse called her this Evening stating to seek care due to Potassium being 7.8 and Liver Enzymes being elevated.  Dialysis MTRF (last Session was today). Patient has no Physical Complaints or Symptoms. A&Ox4. GCS 15. Fistula Left Arm.

## 2020-12-19 ENCOUNTER — Emergency Department (HOSPITAL_BASED_OUTPATIENT_CLINIC_OR_DEPARTMENT_OTHER): Payer: Medicare Other | Admitting: Radiology

## 2020-12-19 DIAGNOSIS — J189 Pneumonia, unspecified organism: Secondary | ICD-10-CM | POA: Diagnosis not present

## 2020-12-19 LAB — URINALYSIS, DIPSTICK ONLY
Glucose, UA: 100 mg/dL — AB
Ketones, ur: 15 mg/dL — AB
Nitrite: NEGATIVE
Protein, ur: >300 mg/dL — AB
Specific Gravity, Urine: >1.03 — ABNORMAL HIGH (ref 1.005–1.030)
pH: 6.5 (ref 5.0–8.0)

## 2020-12-19 LAB — CBC WITH DIFFERENTIAL/PLATELET
Abs Immature Granulocytes: 0.04 10*3/uL (ref 0.00–0.07)
Basophils Absolute: 0.1 10*3/uL (ref 0.0–0.1)
Basophils Relative: 1 %
Eosinophils Absolute: 0 10*3/uL (ref 0.0–0.5)
Eosinophils Relative: 0 %
HCT: 30.3 % — ABNORMAL LOW (ref 36.0–46.0)
Hemoglobin: 10.4 g/dL — ABNORMAL LOW (ref 12.0–15.0)
Immature Granulocytes: 1 %
Lymphocytes Relative: 32 %
Lymphs Abs: 1.8 10*3/uL (ref 0.7–4.0)
MCH: 31.2 pg (ref 26.0–34.0)
MCHC: 34.3 g/dL (ref 30.0–36.0)
MCV: 91 fL (ref 80.0–100.0)
Monocytes Absolute: 0.6 10*3/uL (ref 0.1–1.0)
Monocytes Relative: 10 %
Neutro Abs: 3.2 10*3/uL (ref 1.7–7.7)
Neutrophils Relative %: 56 %
Platelets: 185 10*3/uL (ref 150–400)
RBC: 3.33 MIL/uL — ABNORMAL LOW (ref 3.87–5.11)
RDW: 13.4 % (ref 11.5–15.5)
WBC: 5.6 10*3/uL (ref 4.0–10.5)
nRBC: 0 % (ref 0.0–0.2)

## 2020-12-19 LAB — COMPREHENSIVE METABOLIC PANEL
ALT: 43 U/L (ref 0–44)
AST: 34 U/L (ref 15–41)
Albumin: 4.1 g/dL (ref 3.5–5.0)
Alkaline Phosphatase: 51 U/L (ref 38–126)
Anion gap: 13 (ref 5–15)
BUN: 23 mg/dL — ABNORMAL HIGH (ref 6–20)
CO2: 31 mmol/L (ref 22–32)
Calcium: 9.9 mg/dL (ref 8.9–10.3)
Chloride: 89 mmol/L — ABNORMAL LOW (ref 98–111)
Creatinine, Ser: 7.83 mg/dL — ABNORMAL HIGH (ref 0.44–1.00)
GFR, Estimated: 6 mL/min — ABNORMAL LOW (ref >60)
Glucose, Bld: 113 mg/dL — ABNORMAL HIGH (ref 70–99)
Potassium: 3.5 mmol/L (ref 3.5–5.1)
Sodium: 133 mmol/L — ABNORMAL LOW (ref 135–145)
Total Bilirubin: 0.9 mg/dL (ref 0.3–1.2)
Total Protein: 7.6 g/dL (ref 6.5–8.1)

## 2020-12-19 LAB — RESP PANEL BY RT-PCR (FLU A&B, COVID) ARPGX2
Influenza A by PCR: NEGATIVE
Influenza B by PCR: NEGATIVE
SARS Coronavirus 2 by RT PCR: NEGATIVE

## 2020-12-19 LAB — TSH: TSH: 1.01 u[IU]/mL (ref 0.450–4.500)

## 2020-12-19 LAB — LACTIC ACID, PLASMA: Lactic Acid, Venous: 0.8 mmol/L (ref 0.5–1.9)

## 2020-12-19 LAB — LIPASE, BLOOD: Lipase: 83 U/L — ABNORMAL HIGH (ref 11–51)

## 2020-12-19 MED ORDER — LEVOFLOXACIN 500 MG PO TABS: 500.0000 mg | ORAL_TABLET | ORAL | 0 refills | Status: DC

## 2020-12-19 MED ORDER — ACETAMINOPHEN 325 MG PO TABS
650.0000 mg | ORAL_TABLET | Freq: Once | ORAL | Status: AC
  Administered 2020-12-19: 650 mg via ORAL
  Filled 2020-12-19: qty 2

## 2020-12-19 MED ORDER — LEVOFLOXACIN 750 MG PO TABS
750.0000 mg | ORAL_TABLET | Freq: Once | ORAL | Status: AC
  Administered 2020-12-19: 750 mg via ORAL
  Filled 2020-12-19: qty 1

## 2020-12-19 NOTE — Discharge Instructions (Addendum)
You declined admission to the hospital today.  Your blood test show that your potassium and liver function are normal and the tests you had the other day were likely incorrect.  Your fever is likely coming from pneumonia.  Take the antibiotics as prescribed after dialysis. Return to the ED with difficulty breathing, persistent fever, vomiting, chest pain, any other concerns.

## 2020-12-22 ENCOUNTER — Telehealth: Payer: Self-pay

## 2020-12-22 ENCOUNTER — Encounter: Payer: Self-pay | Admitting: Internal Medicine

## 2020-12-22 NOTE — Telephone Encounter (Signed)
Transition Care Management Unsuccessful Follow-up Telephone Call  Date of discharge and from where:  12/19/2020 from [place]  Attempts:  1st Attempt  Reason for unsuccessful TCM follow-up call:  Left voice message

## 2020-12-23 ENCOUNTER — Other Ambulatory Visit: Payer: Self-pay

## 2020-12-23 ENCOUNTER — Encounter: Payer: Self-pay | Admitting: Nurse Practitioner

## 2020-12-23 ENCOUNTER — Ambulatory Visit (INDEPENDENT_AMBULATORY_CARE_PROVIDER_SITE_OTHER): Payer: Medicare Other | Admitting: Nurse Practitioner

## 2020-12-23 VITALS — BP 112/88 | HR 90 | Temp 99.1°F | Ht 63.0 in | Wt 219.8 lb

## 2020-12-23 DIAGNOSIS — R059 Cough, unspecified: Secondary | ICD-10-CM

## 2020-12-23 DIAGNOSIS — J189 Pneumonia, unspecified organism: Secondary | ICD-10-CM

## 2020-12-23 DIAGNOSIS — R112 Nausea with vomiting, unspecified: Secondary | ICD-10-CM

## 2020-12-23 MED ORDER — ALBUTEROL SULFATE HFA 108 (90 BASE) MCG/ACT IN AERS: 2.0000 | INHALATION_SPRAY | Freq: Four times a day (QID) | RESPIRATORY_TRACT | 2 refills | Status: DC | PRN

## 2020-12-23 MED ORDER — ONDANSETRON HCL 4 MG PO TABS: 4.0000 mg | ORAL_TABLET | Freq: Every day | ORAL | 1 refills | Status: DC | PRN

## 2020-12-23 NOTE — Patient Instructions (Signed)
Aspiration Pneumonia, Adult Aspiration pneumonia is an infection that occurs after lung (pulmonary) aspiration. Pulmonary aspiration is when you inhale a large amount of food, liquid, stomach acid, or saliva into the lungs. This can cause inflammation and infection in the lungs. This can make you cough and make it hard to breathe. Aspiration pneumonia is a serious condition and can be life-threatening. What are the causes? This condition may be caused by: Bacteria in food, liquid, stomach acid, or saliva that is inhaled into the lung. Irritation and inflammation that results from material, such as blood or a foreign body, being inhaled into the lung. This can lead to an infection even though the material is not originally contaminated with bacteria. What increases the risk? You are more likely to get aspiration pneumonia if you have a condition that makes it hard to breathe, swallow, cough, or gag. These conditions may include: A breathing disorder, such as chronic obstructive pulmonary disease, that makes it hard to eat or drink while breathing. A brain (neurologic) disorder, such as stroke, seizures, Parkinson's disease, dementia, amyotrophic lateral sclerosis (ALS), or brain injury. Having gastroesophageal reflux disease (GERD). Having a weak disease-fighting system (immune system). Having a narrowing of the tube that carries food to the stomach (esophageal narrowing). Other factors that may make you more likely to get aspiration pneumonia include: Being older than age 60 and frail. Being given a general anesthetic for procedures. Drinking too much alcohol and passing out. If you pass out and vomit, then vomit can be inhaled into your lungs. Taking certain medicines, such as tranquilizers or sedatives. Taking poor care of your mouth and teeth. Being malnourished. What are the signs or symptoms? The main symptom of pulmonary aspiration may be an episode of choking or coughing while eating or  drinking. When aspiration pneumonia develops, symptoms include: Persistent cough. Difficulty breathing, such as wheezing or shortness of breath. Fever. Chest pain. Being more tired than usual (fatigue). Pulmonary aspiration may be silent, meaning that it is not associated with coughing or choking while eating or drinking. How is this diagnosed? This condition may be diagnosed based on: A physical exam. Tests, such as: A chest X-ray. A sputum culture. Saliva and mucus (sputum) are collected from the lungs or the tubes that carry air to the lungs (bronchi). The sputum is then tested for bacteria. Oximetry. A sensor or clip is placed on areas such as a finger, earlobe, or toe to measure the oxygen level in your blood. Blood tests. A swallowing study. This test looks at how food is swallowed and whether it goes into your windpipe (trachea) or esophagus. A bronchoscopy. This test uses a flexible tube (bronchoscope) to see inside the lungs. How is this treated? This condition may be treated with: Medicines. Antibiotic medicine will be given to kill the pneumonia bacteria. Other medicines may also be used to reduce fever, pain, or inflammation. Breathing assistance and oxygen therapy. Depending on how well you are breathing, you may need to be given oxygen, or you may need breathing support from a breathing machine (ventilator). Thoracentesis. This is a procedure to remove fluid that has built up in the space between the linings of the chest wall and the lungs. Dietary changes. You may need to avoid certain food textures or liquids. For people who have recurrent aspiration pneumonia, a feeding tube might be placed in the stomach for nutrition. Follow these instructions at home: Medicines  Take over-the-counter and prescription medicines only as told by your health care provider.   If you were prescribed an antibiotic medicine, take it as told by your health care provider. Do not stop taking the  antibiotic even if you start to feel better. Take cough medicine only if you are losing sleep. Cough medicine can prevent your body's natural ability to remove mucus from your lungs. General instructions  Carefully follow any eating instructions you were given, such as avoiding certain food textures or thickening your liquids. Thickening liquids reduces the risk of developing aspiration pneumonia again. Return to normal activities as told by your health care provider. Ask your health care provider what activities are safe for you. Sleep in a semi-upright position at night. Try to sleep in a reclining chair, or place a few pillows under your head in bed. Do not use any products that contain nicotine or tobacco, such as cigarettes, e-cigarettes, and chewing tobacco. If you need help quitting, ask your health care provider. Keep all follow-up visits as told by your health care provider. This is important. Contact a health care provider if you: Have a fever. Are coughing or choking while eating or drinking. Continue to have signs or symptoms of aspiration pneumonia. Get help right away if you have: Worsening shortness of breath, wheezing, or difficulty breathing. Chest pain. Summary Aspiration pneumonia is an infection that occurs after lung (pulmonary) aspiration. Pulmonary aspiration is when you inhale a large amount of food, liquid, stomach acid, or saliva into the lungs. The main symptom of pulmonary aspiration may be an episode of choking or coughing. It may also be silent without coughing or choking. You are more likely to get aspiration pneumonia if you have a condition that makes it hard to breathe, swallow, cough, or gag. This information is not intended to replace advice given to you by your health care provider. Make sure you discuss any questions you have with your health care provider. Document Revised: 05/04/2019 Document Reviewed: 05/04/2019 Elsevier Patient Education  2022 [employer]  

## 2020-12-23 NOTE — Progress Notes (Signed)
I,Joshwa Hemric,acting as a Education administrator for Minette Brine, FNP.,have documented all relevant documentation on the behalf of Minette Brine, FNP,as directed by  Minette Brine, FNP while in the presence of Minette Brine, [place].  This visit occurred during the SARS-CoV-2 public health emergency.  Safety protocols were in place, including screening questions prior to the visit, additional usage of staff PPE, and extensive cleaning of exam room while observing appropriate contact time as indicated for disinfecting solutions.  Subjective:     Patient ID: Cassandra Campos , female    DOB: February 26, 1971 , 50 y.o.   MRN: [id number]   Chief Complaint  Patient presents with   Pneumonia     HPI  Pt presents today for an ED f/u with pneumonia. While at dialysis her electrolytes were abnormal and was sent to Med center on Thursday during that time she was coughing and had a CXR done. She did go to the [facility] on [address] and diagnosed with pneumonia. She was seen 2 weeks ago treated with rocephin and had negative covid, did not get much better. Patient is experiencing a productive cough, lightheaded, and dizziness. She also states having trouble with keeping fluid down and foods. Not taking any medications for this. She currently takes Levaquin every other day.  While at the ED she did test neg for covid. She has not been back to work since that time. She has dialysis on Mon, Tue, Thur and Fri. She has not been back to work since Thursday 8/4.         Past Medical History:  Diagnosis Date   Anemia associated with chronic renal failure    ASCUS of cervix with negative high risk HPV 06/2017   Back pain    Chest pain    CHF (congestive heart failure) ([place])    CKD (chronic kidney disease), stage IV ([place]) no dialysis yet   nephrologist-  dr Jamal Maes-- scheduled next visit 09-08-2017 (lov 07-05-2017)   Constipation    Diabetic retinopathy ([place])    Dialysis patient ([place])    Edema, lower extremity     Elevated transaminase level    Family history of breast cancer    Family history of ovarian cancer    Family history of stomach cancer    Fatty liver    FOLLOWED BY DR Henrene Pastor   GERD (gastroesophageal reflux disease)    Hypertension    Idiopathic gout of multiple sites    09-05-2017 per pt stable   Insulin dependent type 2 diabetes mellitus ([place])    followed by dr Toy Care   Joint pain    Mixed hyperlipidemia    OSA (obstructive sleep apnea)    per study 10-20-2015 mild osa w/ AHI 10.3/hr;  09-05-2017 per pt has not used cpap since 1 yr ago   Palpitations    Peripheral neuropathy    feet   S/P arteriovenous (AV) fistula creation 07-04-2015  w/ revision 02-14-2017   left braniocephilic   Shortness of breath    Trigger thumb, right thumb    Umbilical hernia    Vitamin D deficiency      Family History  Problem Relation Age of Onset   Diabetes Mother    Hypertension Mother    Thyroid disease Mother    Ovarian cancer Mother        dx 22s   Diabetes Father    Hypertension Father    Cancer Father        STOMACH   Stomach cancer Father  61   Stroke Maternal Grandmother    Stroke Maternal Grandfather    Breast cancer Maternal Aunt 69   Cancer Paternal Grandfather        unknown type, mets to brain   Cancer Other        unknown type, father's first cousin   Cancer Paternal Uncle        unknown type, dx >50   Cancer Cousin        unknown type, dx 99s, paternal first cousin   Cancer Other        unknown types, 4 of mother's first cousins   Colon cancer Neg Hx    Rectal cancer Neg Hx      Current Outpatient Medications:    albuterol (VENTOLIN HFA) 108 (90 Base) MCG/ACT inhaler, Inhale 2 puffs into the lungs every 6 (six) hours as needed for wheezing or shortness of breath., Disp: 8 g, Rfl: 2   allopurinol (ZYLOPRIM) 300 MG tablet, TAKE 1 TABLET BY MOUTH EVERY MORNING, Disp: 90 tablet, Rfl: 1   B Complex-C-Folic Acid (RENAL) 1 MG CAPS, daily at 2 am., Disp: , Rfl:     benzonatate (TESSALON PERLES) 100 MG capsule, Take 1 capsule (100 mg total) by mouth every 6 (six) hours as needed., Disp: 30 capsule, Rfl: 1   cinacalcet (SENSIPAR) 60 MG tablet, Take 60 mg by mouth daily. Every other day, Disp: , Rfl:    Continuous Blood Gluc Receiver (FREESTYLE LIBRE 2 READER) DEVI, 1 each by Does not apply route daily., Disp: 1 each, Rfl: 0   Continuous Blood Gluc Sensor (FREESTYLE LIBRE 2 SENSOR) MISC, 1 each by Does not apply route every 14 (fourteen) days., Disp: 6 each, Rfl: 3   Insulin Pen Needle 32G X 4 MM MISC, Use 2x a day, Disp: 200 each, Rfl: 3   insulin regular human CONCENTRATED (HUMULIN R U-500 KWIKPEN) 500 UNIT/ML kwikpen, Inject 200 units before breakfast 160-170 units before dinner, Disp: 16 mL, Rfl: 11   lanthanum (FOSRENOL) 1000 MG chewable tablet, Chew 1,000 mg by mouth 3 (three) times daily with meals., Disp: , Rfl:    levofloxacin (LEVAQUIN) 500 MG tablet, Take 1 tablet (500 mg total) by mouth every other day., Disp: 5 tablet, Rfl: 0   lidocaine-prilocaine (EMLA) cream, USE ON ARAMS AS NEEDED FOR DIALYSIS, Disp: , Rfl: 10   Microlet Lancets MISC, Use as directed to check blood sugars 4 times per day dx: e11.22, Disp: 300 each, Rfl: 2   MITIGARE 0.6 MG CAPS, Monday, Wednesday, and Friday, Disp: 90 capsule, Rfl: 0   ondansetron (ZOFRAN) 4 MG tablet, Take 1 tablet (4 mg total) by mouth daily as needed for nausea or vomiting., Disp: 30 tablet, Rfl: 1   pregabalin (LYRICA) 100 MG capsule, Take 1 capsule (100 mg total) by mouth daily. In the morning, Disp: 90 capsule, Rfl: 1   pregabalin (LYRICA) 75 MG capsule, Take 1 capsule (75 mg total) by mouth daily. At bedtime, Disp: 90 capsule, Rfl: 1   RESTASIS 0.05 % ophthalmic emulsion, INSTILL 1 DROP INTO BOTH EYES TWICE A DAY, Disp: , Rfl: 3   rosuvastatin (CRESTOR) 20 MG tablet, TAKE 1 TABLET BY MOUTH EVERY DAY, Disp: 90 tablet, Rfl: 3   tirzepatide (MOUNJARO) 2.5 MG/0.5ML Pen, Inject 2.5 mg into the skin once a week.  (Patient not taking: Reported on 12/15/2020), Disp: 2 mL, Rfl: 0   No Known Allergies   Review of Systems  Constitutional: Negative.   Respiratory:  Positive  for cough and shortness of breath.   Cardiovascular: Negative.  Negative for chest pain, palpitations and leg swelling.  Neurological:  Positive for dizziness and light-headedness. Negative for headaches.  Psychiatric/Behavioral: Negative.      Today's Vitals   12/23/20 0845  BP: 112/88  Pulse: 90  Temp: 99.1 F (37.3 C)  Weight: 219 lb 12.8 oz (99.7 kg)  Height: 5\' 3"  (1.6 m)   Body mass index is 38.94 kg/m.   Objective:  Physical Exam Vitals reviewed.  Constitutional:      General: She is not in acute distress.    Appearance: Normal appearance. She is obese.  Cardiovascular:     Pulses: Normal pulses.     Heart sounds: Normal heart sounds. No murmur heard. Pulmonary:     Effort: Pulmonary effort is normal. No respiratory distress.     Breath sounds: Normal breath sounds. No wheezing.  Skin:    Capillary Refill: Capillary refill takes less than 2 seconds.  Neurological:     General: No focal deficit present.     Mental Status: She is alert and oriented to person, place, and time.     Cranial Nerves: No cranial nerve deficit.     Motor: No weakness.  Psychiatric:        Mood and Affect: Mood normal.        Thought Content: Thought content normal.        Judgment: Judgment normal.        Assessment And Plan:     1. Pneumonia of left lung due to infectious organism, unspecified part of lung No abnormal lung sounds at this time Will recheck CXR in 4 weeks Discussed pneumonia will cause fatigue and may take several weeks to feel better.  - DG Chest 2 View; Future  2. Cough - albuterol (VENTOLIN HFA) 108 (90 Base) MCG/ACT inhaler; Inhale 2 puffs into the lungs every 6 (six) hours as needed for wheezing or shortness of breath.  Dispense: 8 g; Refill: 2  3. Non-intractable vomiting with nausea, unspecified  vomiting type Comments: Zofran sent, no dose adjustment due to dialysis Bland diet, avoid fried and dairy foods - ondansetron (ZOFRAN) 4 MG tablet; Take 1 tablet (4 mg total) by mouth daily as needed for nausea or vomiting.  Dispense: 30 tablet; Refill: 1    Patient was given opportunity to ask questions. Patient verbalized understanding of the plan and was able to repeat key elements of the plan. All questions were answered to their satisfaction.  Minette Brine, FNP   I, Minette Brine, FNP, have reviewed all documentation for this visit. The documentation on 12/23/20 for the exam, diagnosis, procedures, and orders are all accurate and complete.   IF YOU HAVE BEEN REFERRED TO A SPECIALIST, IT MAY TAKE 1-2 WEEKS TO SCHEDULE/PROCESS THE REFERRAL. IF YOU HAVE NOT HEARD FROM US/SPECIALIST IN TWO WEEKS, PLEASE GIVE [place] A CALL AT [phone] X [phone].   THE PATIENT IS ENCOURAGED TO PRACTICE SOCIAL DISTANCING DUE TO THE COVID-19 PANDEMIC.

## 2020-12-23 NOTE — Telephone Encounter (Signed)
Transition Care Management Unsuccessful Follow-up Telephone Call  Date of discharge and from where:  12/19/2020 from [facility]  Attempts:  2nd Attempt  Reason for unsuccessful TCM follow-up call:  Left voice message

## 2020-12-24 ENCOUNTER — Telehealth: Payer: Self-pay

## 2020-12-24 LAB — CULTURE, BLOOD (ROUTINE X 2)
Culture: NO GROWTH
Culture: NO GROWTH
Special Requests: ADEQUATE
Special Requests: ADEQUATE

## 2020-12-24 NOTE — Telephone Encounter (Signed)
Contacted patient due to Red Flag in EMMI Report where patient answered that she has "Lost interest in things she use to do". Patient has stated that she does not feel depressed and her response is related to the fact that she has been sick and just doesn't really feel like being around anyone at this time.   Patient completed ED follow up appointment with her PCP on 12/23/2020. There is currently no further evaluation to be completed at this time.

## 2020-12-24 NOTE — Telephone Encounter (Signed)
Transition Care Management Follow-up Telephone Call Date of discharge and from where: 12/19/2020 from [facility] How have you been since you were released from the hospital? Pt states that she is feeling some better and does not have any questions or concerns at this time.  Any questions or concerns? No  Items Reviewed: Did the pt receive and understand the discharge instructions provided? Yes  Medications obtained and verified? Yes  Other? No  Any new allergies since your discharge? No  Dietary orders reviewed? No Do you have support at home? Yes   Functional Questionnaire: (I = Independent and D = Dependent) ADLs: I  Bathing/Dressing- I  Meal Prep- I  Eating- I  Maintaining continence- I  Transferring/Ambulation- I  Managing Meds- I  Follow up appointments reviewed:  PCP Hospital f/u appt confirmed? No  Appt with PCP office yesterday.  Are transportation arrangements needed? No  If their condition worsens, is the pt aware to call PCP or go to the Emergency Dept.? Yes Was the patient provided with contact information for the PCP's office or ED? Yes Was to pt encouraged to call back with questions or concerns? Yes

## 2020-12-25 ENCOUNTER — Encounter: Payer: Self-pay | Admitting: Nurse Practitioner

## 2021-01-07 ENCOUNTER — Other Ambulatory Visit: Payer: Self-pay

## 2021-01-07 ENCOUNTER — Encounter (INDEPENDENT_AMBULATORY_CARE_PROVIDER_SITE_OTHER): Payer: Self-pay | Admitting: Family Medicine

## 2021-01-07 ENCOUNTER — Ambulatory Visit (INDEPENDENT_AMBULATORY_CARE_PROVIDER_SITE_OTHER): Payer: Medicare Other | Admitting: Family Medicine

## 2021-01-07 VITALS — BP 122/73 | HR 86 | Temp 97.5°F | Ht 63.0 in | Wt 219.0 lb

## 2021-01-07 DIAGNOSIS — N186 End stage renal disease: Secondary | ICD-10-CM | POA: Diagnosis not present

## 2021-01-07 DIAGNOSIS — Z6841 Body Mass Index (BMI) 40.0 and over, adult: Secondary | ICD-10-CM

## 2021-01-07 DIAGNOSIS — Z992 Dependence on renal dialysis: Secondary | ICD-10-CM

## 2021-01-07 DIAGNOSIS — Z794 Long term (current) use of insulin: Secondary | ICD-10-CM

## 2021-01-07 DIAGNOSIS — E1122 Type 2 diabetes mellitus with diabetic chronic kidney disease: Secondary | ICD-10-CM

## 2021-01-07 DIAGNOSIS — E1159 Type 2 diabetes mellitus with other circulatory complications: Secondary | ICD-10-CM | POA: Diagnosis not present

## 2021-01-07 DIAGNOSIS — I152 Hypertension secondary to endocrine disorders: Secondary | ICD-10-CM

## 2021-01-07 NOTE — Progress Notes (Signed)
Chief Complaint:   OBESITY Cassandra Campos is here to discuss her progress with her obesity treatment plan along with follow-up of her obesity related diagnoses. See Medical Weight Management Flowsheet for complete bioelectrical impedance results.  Today's visit was #: 10 Starting weight: 228 lbs Starting date: 08/30/2019 Weight change since last visit: +2 lbs Total lbs lost to date: 9 lbs Total weight loss percentage to date: -3.95%  Nutrition Plan:  Lower carbohydrate, vegetable and lean protein rich diet plan for 90-95% of the time.  Activity: Walking for 15 minutes 3 times per week.  Interim History: Cassandra Campos says she lost 10 pounds previously by following the [facility] plan.  She then got pneumonia, but is now feeling better.  Blood glucose stayed stable, she says.  No increased insulin needs.  We discussed Mounjaro.  Assessment/Plan:   1. Type 2 diabetes mellitus with chronic kidney disease on chronic dialysis, with long-term current use of insulin (HCC) Diabetes Mellitus: Not at goal. Medication: Ozempic 1 mg subcutaneously weekly. October 11 is her next appointment with the transplant team.  Plan: The patient will continue to focus on protein-rich, low simple carbohydrate foods. We reviewed the importance of hydration, regular exercise for stress reduction, and restorative sleep.   Lab Results  Component Value Date   HGBA1C 8.5 (A) 07/30/2020   HGBA1C 7.9 (H) 01/08/2020   HGBA1C 8.7 (A) 09/11/2019   Lab Results  Component Value Date   MICROALBUR 150 08/25/2020   LDLCALC 63 08/25/2020   CREATININE 7.83 (H) 12/18/2020   2. ESRD (end stage renal disease) ([place]) She is on the transplant list.  Her goal is to lose 25 pounds.  She sees Dr. Jimmy Footman.  3. Hypertension associated with diabetes ([place]) At goal. Medications: None.   Plan: Avoid buying foods that are: processed, frozen, or prepackaged to avoid excess salt. We will watch for signs of hypotension as she continues lifestyle  modifications.  BP Readings from Last 3 Encounters:  01/07/21 122/73  12/23/20 112/88  12/19/20 110/79   Lab Results  Component Value Date   CREATININE 7.83 (H) 12/18/2020   4. Obesity, current BMI 38.9  Course: Cassandra Campos is currently in the action stage of change. As such, her goal is to continue with weight loss efforts.   Nutrition goals: She has agreed to following a lower carbohydrate, vegetable and lean protein rich diet plan.   Exercise goals:  As is.  Behavioral modification strategies: planning for success.  Cassandra Campos has agreed to follow-up with our clinic in 3 weeks. She was informed of the importance of frequent follow-up visits to maximize her success with intensive lifestyle modifications for her multiple health conditions.   Objective:   Blood pressure 122/73, pulse 86, temperature (!) 97.5 F (36.4 C), temperature source Oral, height 5\' 3"  (1.6 m), weight 219 lb (99.3 kg), SpO2 98 %. Body mass index is 38.79 kg/m.  General: Cooperative, alert, well developed, in no acute distress. HEENT: Conjunctivae and lids unremarkable. Cardiovascular: Regular rhythm.  Lungs: Normal work of breathing. Neurologic: No focal deficits.   Lab Results  Component Value Date   CREATININE 7.83 (H) 12/18/2020   BUN 23 (H) 12/18/2020   NA 133 (L) 12/18/2020   K 3.5 12/18/2020   CL 89 (L) 12/18/2020   CO2 31 12/18/2020   Lab Results  Component Value Date   ALT 43 12/18/2020   AST 34 12/18/2020   ALKPHOS 51 12/18/2020   BILITOT 0.9 12/18/2020   Lab Results  Component  Value Date   HGBA1C 8.5 (A) 07/30/2020   HGBA1C 7.9 (H) 01/08/2020   HGBA1C 8.7 (A) 09/11/2019   HGBA1C 10.9 (H) 04/19/2019   HGBA1C 6.9 (H) 11/09/2018   Lab Results  Component Value Date   TSH 1.010 12/18/2020   Lab Results  Component Value Date   CHOL 183 08/25/2020   HDL 39 (L) 08/25/2020   LDLCALC 63 08/25/2020   TRIG 536 (H) 08/25/2020   CHOLHDL 4.7 (H) 08/25/2020   Lab Results  Component  Value Date   WBC 5.6 12/18/2020   HGB 10.4 (L) 12/18/2020   HCT 30.3 (L) 12/18/2020   MCV 91.0 12/18/2020   PLT 185 12/18/2020   Lab Results  Component Value Date   IRON 32 12/11/2020   TIBC 276 12/11/2020   FERRITIN 3,133 (H) 12/11/2020   Obesity Behavioral Intervention:   Approximately 15 minutes were spent on the discussion below.  ASK: We discussed the diagnosis of obesity with Cassandra Campos today and Cassandra Campos agreed to give [place] permission to discuss obesity behavioral modification therapy today.  ASSESS: Cassandra Campos has the diagnosis of obesity and her BMI today is 38.9. Cassandra Campos is in the action stage of change.   ADVISE: Cassandra Campos was educated on the multiple health risks of obesity as well as the benefit of weight loss to improve her health. She was advised of the need for long term treatment and the importance of lifestyle modifications to improve her current health and to decrease her risk of future health problems.  AGREE: Multiple dietary modification options and treatment options were discussed and Cassandra Campos agreed to follow the recommendations documented in the above note.  ARRANGE: Cassandra Campos was educated on the importance of frequent visits to treat obesity as outlined per CMS and USPSTF guidelines and agreed to schedule her next follow up appointment today.  Attestation Statements:   Reviewed by clinician on day of visit: allergies, medications, problem list, medical history, surgical history, family history, social history, and previous encounter notes.  I, Water quality scientist, CMA, am acting as transcriptionist for Briscoe Deutscher, DO  I have reviewed the above documentation for accuracy and completeness, and I agree with the above. Briscoe Deutscher, DO

## 2021-01-27 ENCOUNTER — Telehealth: Payer: Self-pay | Admitting: Pharmacy Technician

## 2021-01-27 NOTE — Telephone Encounter (Signed)
[facility] Patient Advocate Encounter  OZEMPIC  is covered by current benefit plan. No further PA activity needed    [facility] will continue to follow.   Ronney Asters, CPhT Patient Advocate [facility] Phone: [phone] Fax:  [phone]

## 2021-01-28 ENCOUNTER — Ambulatory Visit (INDEPENDENT_AMBULATORY_CARE_PROVIDER_SITE_OTHER): Payer: Medicare Other | Admitting: Family Medicine

## 2021-01-28 ENCOUNTER — Encounter (INDEPENDENT_AMBULATORY_CARE_PROVIDER_SITE_OTHER): Payer: Self-pay

## 2021-02-02 ENCOUNTER — Encounter: Payer: Self-pay | Admitting: Internal Medicine

## 2021-02-02 ENCOUNTER — Ambulatory Visit (INDEPENDENT_AMBULATORY_CARE_PROVIDER_SITE_OTHER): Payer: Medicare Other | Admitting: Internal Medicine

## 2021-02-02 ENCOUNTER — Other Ambulatory Visit: Payer: Self-pay

## 2021-02-02 VITALS — BP 128/60 | HR 93 | Temp 98.7°F | Ht 63.0 in | Wt 217.4 lb

## 2021-02-02 DIAGNOSIS — R413 Other amnesia: Secondary | ICD-10-CM | POA: Diagnosis not present

## 2021-02-02 DIAGNOSIS — R55 Syncope and collapse: Secondary | ICD-10-CM | POA: Diagnosis not present

## 2021-02-02 DIAGNOSIS — G4733 Obstructive sleep apnea (adult) (pediatric): Secondary | ICD-10-CM

## 2021-02-02 DIAGNOSIS — Z794 Long term (current) use of insulin: Secondary | ICD-10-CM

## 2021-02-02 DIAGNOSIS — N186 End stage renal disease: Secondary | ICD-10-CM

## 2021-02-02 DIAGNOSIS — Z6838 Body mass index (BMI) 38.0-38.9, adult: Secondary | ICD-10-CM

## 2021-02-02 DIAGNOSIS — E1122 Type 2 diabetes mellitus with diabetic chronic kidney disease: Secondary | ICD-10-CM

## 2021-02-02 DIAGNOSIS — Z9989 Dependence on other enabling machines and devices: Secondary | ICD-10-CM

## 2021-02-02 DIAGNOSIS — Z992 Dependence on renal dialysis: Secondary | ICD-10-CM
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
## 2021-02-03 LAB — RPR: RPR Ser Ql: NONREACTIVE

## 2021-02-03 LAB — HEMOGLOBIN A1C
Est. average glucose Bld gHb Est-mCnc: 134 mg/dL
Hgb A1c MFr Bld: 6.3 % — ABNORMAL HIGH (ref 4.8–5.6)

## 2021-02-05 ENCOUNTER — Other Ambulatory Visit: Payer: Self-pay | Admitting: Internal Medicine

## 2021-02-05 ENCOUNTER — Telehealth: Payer: Self-pay | Admitting: Cardiovascular Disease

## 2021-02-05 DIAGNOSIS — R413 Other amnesia: Secondary | ICD-10-CM

## 2021-02-05 NOTE — Telephone Encounter (Signed)
Patient would like to switch from Dr. Gwenlyn Found to Dr. Oval Linsey

## 2021-02-14 ENCOUNTER — Other Ambulatory Visit: Payer: PRIVATE HEALTH INSURANCE

## 2021-02-21 ENCOUNTER — Other Ambulatory Visit: Payer: Self-pay

## 2021-02-21 ENCOUNTER — Ambulatory Visit
Admission: RE | Admit: 2021-02-21 | Discharge: 2021-02-21 | Disposition: A | Discharge status: Home / Self Care | Payer: PRIVATE HEALTH INSURANCE | Source: Ambulatory Visit | Attending: Internal Medicine | Admitting: Internal Medicine

## 2021-02-21 DIAGNOSIS — R413 Other amnesia: Secondary | ICD-10-CM

## 2021-02-25 ENCOUNTER — Encounter: Payer: Self-pay | Admitting: Internal Medicine

## 2021-02-26 ENCOUNTER — Encounter (INDEPENDENT_AMBULATORY_CARE_PROVIDER_SITE_OTHER): Payer: Self-pay | Admitting: Family Medicine

## 2021-02-26 ENCOUNTER — Ambulatory Visit (INDEPENDENT_AMBULATORY_CARE_PROVIDER_SITE_OTHER): Payer: Medicare Other | Admitting: Family Medicine

## 2021-02-26 ENCOUNTER — Other Ambulatory Visit: Payer: Self-pay

## 2021-02-26 VITALS — BP 116/70 | HR 92 | Temp 98.1°F | Ht 63.0 in | Wt 215.0 lb

## 2021-02-26 DIAGNOSIS — E785 Hyperlipidemia, unspecified: Secondary | ICD-10-CM

## 2021-02-26 DIAGNOSIS — Z794 Long term (current) use of insulin: Secondary | ICD-10-CM

## 2021-02-26 DIAGNOSIS — Z992 Dependence on renal dialysis: Secondary | ICD-10-CM | POA: Diagnosis not present

## 2021-02-26 DIAGNOSIS — N186 End stage renal disease: Secondary | ICD-10-CM

## 2021-02-26 DIAGNOSIS — E1169 Type 2 diabetes mellitus with other specified complication: Secondary | ICD-10-CM | POA: Diagnosis not present

## 2021-02-26 DIAGNOSIS — E1122 Type 2 diabetes mellitus with diabetic chronic kidney disease: Secondary | ICD-10-CM

## 2021-02-26 DIAGNOSIS — K760 Fatty (change of) liver, not elsewhere classified: Secondary | ICD-10-CM | POA: Diagnosis not present

## 2021-02-26 DIAGNOSIS — Z6841 Body Mass Index (BMI) 40.0 and over, adult: Secondary | ICD-10-CM

## 2021-02-26 MED ORDER — ROSUVASTATIN CALCIUM 20 MG PO TABS: 20.0000 mg | ORAL_TABLET | Freq: Every day | ORAL | 3 refills | Status: DC

## 2021-03-02 ENCOUNTER — Other Ambulatory Visit: Payer: Self-pay

## 2021-03-02 ENCOUNTER — Encounter: Payer: Self-pay | Admitting: Internal Medicine

## 2021-03-02 ENCOUNTER — Ambulatory Visit (INDEPENDENT_AMBULATORY_CARE_PROVIDER_SITE_OTHER): Payer: Medicare Other | Admitting: Internal Medicine

## 2021-03-02 VITALS — BP 122/78 | HR 96 | Temp 98.4°F | Ht 63.0 in | Wt 219.6 lb

## 2021-03-02 DIAGNOSIS — Z6838 Body mass index (BMI) 38.0-38.9, adult: Secondary | ICD-10-CM

## 2021-03-02 DIAGNOSIS — N186 End stage renal disease: Secondary | ICD-10-CM | POA: Diagnosis not present

## 2021-03-02 DIAGNOSIS — I120 Hypertensive chronic kidney disease with stage 5 chronic kidney disease or end stage renal disease: Secondary | ICD-10-CM

## 2021-03-02 DIAGNOSIS — Z992 Dependence on renal dialysis: Secondary | ICD-10-CM

## 2021-03-02 NOTE — Progress Notes (Signed)
I,Katawbba Wiggins,acting as a Education administrator for Maximino Greenland, MD.,have documented all relevant documentation on the behalf of Maximino Greenland, MD,as directed by  Maximino Greenland, MD while in the presence of Maximino Greenland, MD.  This visit occurred during the SARS-CoV-2 public health emergency.  Safety protocols were in place, including screening questions prior to the visit, additional usage of staff PPE, and extensive cleaning of exam room while observing appropriate contact time as indicated for disinfecting solutions.  Subjective:     Patient ID: Cassandra Campos , female    DOB: Jun 12, 1970 , 50 y.o.   MRN: [id number]   Chief Complaint  Patient presents with   Hypertension    HPI  Patient is here for hypertension follow up. She reports compliance with meds. Denies headaches, chest pain and shortness of breath.   Hypertension This is a chronic problem. The current episode started more than 1 year ago. The problem has been resolved since onset. The problem is controlled. Compliance problems include exercise.     Past Medical History:  Diagnosis Date   Anemia associated with chronic renal failure    ASCUS of cervix with negative high risk HPV 06/2017   Back pain    Chest pain    CHF (congestive heart failure) ([place])    CKD (chronic kidney disease), stage IV ([place]) no dialysis yet   nephrologist-  dr Jamal Maes-- scheduled next visit 09-08-2017 (lov 07-05-2017)   Constipation    Diabetic retinopathy ([place])    Dialysis patient ([place])    Edema, lower extremity    Elevated transaminase level    Family history of breast cancer    Family history of ovarian cancer    Family history of stomach cancer    Fatty liver    FOLLOWED BY DR Henrene Pastor   GERD (gastroesophageal reflux disease)    Hypertension    Idiopathic gout of multiple sites    09-05-2017 per pt stable   Insulin dependent type 2 diabetes mellitus ([place])    followed by dr Toy Care   Joint pain    Mixed hyperlipidemia     OSA (obstructive sleep apnea)    per study 10-20-2015 mild osa w/ AHI 10.3/hr;  09-05-2017 per pt has not used cpap since 1 yr ago   Palpitations    Peripheral neuropathy    feet   S/P arteriovenous (AV) fistula creation 07-04-2015  w/ revision 02-14-2017   left braniocephilic   Shortness of breath    Trigger thumb, right thumb    Umbilical hernia    Vitamin D deficiency      Family History  Problem Relation Age of Onset   Diabetes Mother    Hypertension Mother    Thyroid disease Mother    Ovarian cancer Mother        dx 69s   Diabetes Father    Hypertension Father    Cancer Father        STOMACH   Stomach cancer Father 74   Stroke Maternal Grandmother    Stroke Maternal Grandfather    Breast cancer Maternal Aunt 94   Cancer Paternal Grandfather        unknown type, mets to brain   Cancer Other        unknown type, father's first cousin   Cancer Paternal Uncle        unknown type, dx >50   Cancer Cousin        unknown type, dx 42s, paternal first cousin  Cancer Other        unknown types, 4 of mother's first cousins   Colon cancer Neg Hx    Rectal cancer Neg Hx      Current Outpatient Medications:    allopurinol (ZYLOPRIM) 300 MG tablet, TAKE 1 TABLET BY MOUTH EVERY MORNING (Patient taking differently: Take 300 mg by mouth at bedtime.), Disp: 90 tablet, Rfl: 1   B Complex-C-Folic Acid (RENAL) 1 MG CAPS, Take 1 capsule by mouth at bedtime., Disp: , Rfl:    cinacalcet (SENSIPAR) 60 MG tablet, Take 60 mg by mouth at bedtime., Disp: , Rfl:    Continuous Blood Gluc Receiver (FREESTYLE LIBRE 2 READER) DEVI, 1 each by Does not apply route daily., Disp: 1 each, Rfl: 0   Continuous Blood Gluc Sensor (FREESTYLE LIBRE 2 SENSOR) MISC, 1 each by Does not apply route every 14 (fourteen) days., Disp: 6 each, Rfl: 3   Insulin Pen Needle 32G X 4 MM MISC, Use 2x a day, Disp: 200 each, Rfl: 3   insulin regular human CONCENTRATED (HUMULIN R U-500 KWIKPEN) 500 UNIT/ML kwikpen, Inject  200 units before breakfast 160-170 units before dinner (Patient taking differently: Inject 140-200 Units into the skin See admin instructions. Inject 200 units subcutaneously before breakfast & inject 140-180 units subcutaneously before dinner), Disp: 16 mL, Rfl: 11   lanthanum (FOSRENOL) 1000 MG chewable tablet, Chew 1,000 mg by mouth 3 (three) times daily with meals., Disp: , Rfl:    lidocaine-prilocaine (EMLA) cream, USE ON ARAMS AS NEEDED FOR DIALYSIS, Disp: , Rfl: 10   Microlet Lancets MISC, Use as directed to check blood sugars 4 times per day dx: e11.22, Disp: 300 each, Rfl: 2   MITIGARE 0.6 MG CAPS, Monday, Wednesday, and Friday, Disp: 90 capsule, Rfl: 0   pregabalin (LYRICA) 100 MG capsule, TAKE ONE CAPSULE BY MOUTH EVERY MORNING (Patient taking differently: Take 100 mg by mouth at bedtime.), Disp: 90 capsule, Rfl: 0   rosuvastatin (CRESTOR) 20 MG tablet, Take 1 tablet (20 mg total) by mouth daily., Disp: 90 tablet, Rfl: 3   No Known Allergies   Review of Systems  Constitutional: Negative.   Respiratory: Negative.    Cardiovascular: Negative.   Gastrointestinal: Negative.   Psychiatric/Behavioral: Negative.    All other systems reviewed and are negative.   Today's Vitals   03/02/21 1634  BP: 122/78  Pulse: 96  Temp: 98.4 F (36.9 C)  Weight: 219 lb 9.6 oz (99.6 kg)  Height: 5\' 3"  (1.6 m)   Body mass index is 38.9 kg/m.  Wt Readings from Last 3 Encounters:  04/01/21 217 lb (98.4 kg)  03/23/21 217 lb 8 oz (98.7 kg)  03/02/21 219 lb 9.6 oz (99.6 kg)    BP Readings from Last 3 Encounters:  04/01/21 108/77  03/23/21 136/81  03/02/21 122/78    Objective:  Physical Exam Vitals and nursing note reviewed.  Constitutional:      Appearance: Normal appearance. She is obese.  HENT:     Head: Normocephalic and atraumatic.     Nose:     Comments: Masked     Mouth/Throat:     Comments: Masked  Eyes:     Extraocular Movements: Extraocular movements intact.  Cardiovascular:      Rate and Rhythm: Normal rate and regular rhythm.     Heart sounds: Normal heart sounds.  Pulmonary:     Effort: Pulmonary effort is normal.     Breath sounds: Normal breath sounds.  Musculoskeletal:  Cervical back: Normal range of motion.  Skin:    General: Skin is warm.  Neurological:     General: No focal deficit present.     Mental Status: She is alert.  Psychiatric:        Mood and Affect: Mood normal.        Behavior: Behavior normal.        Assessment And Plan:     1. 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 Encouraged to follow low sodium diet. No med changes today.   2. Class 2 severe obesity due to excess calories with serious comorbidity and body mass index (BMI) of 38.0 to 38.9 in adult [facility]) Comments: She is encouraged to initially strive for BMI less than 30 to decrease cardiac risk. Advised to aim for at least 150 minutes of exercise per week.    3. ESRD on hemodialysis ([place]) Comments: As per Renal. She hopes to be considered for transplant in the near future.   Patient was given opportunity to ask questions. Patient verbalized understanding of the plan and was able to repeat key elements of the plan. All questions were answered to their satisfaction.   I, Maximino Greenland, MD, have reviewed all documentation for this visit. The documentation on 04/12/21 for the exam, diagnosis, procedures, and orders are all accurate and complete.   IF YOU HAVE BEEN REFERRED TO A SPECIALIST, IT MAY TAKE 1-2 WEEKS TO SCHEDULE/PROCESS THE REFERRAL. IF YOU HAVE NOT HEARD FROM US/SPECIALIST IN TWO WEEKS, PLEASE GIVE [place] A CALL AT [phone].   THE PATIENT IS ENCOURAGED TO PRACTICE SOCIAL DISTANCING DUE TO THE COVID-19 PANDEMIC.

## 2021-03-02 NOTE — Patient Instructions (Signed)
Cooking With Less Salt Cooking with less salt is one way to reduce the amount of sodium you get from food. Sodium is one of the elements that make up salt. It is found naturally in foods and is also added to certain foods. Depending on your condition and overall health, your health care provider or dietitian may recommend that you reduce your sodium intake. Most people should have less than 2,300 milligrams (mg) of sodium each day. If you have high blood pressure (hypertension), you may need to limit your sodium to 1,500 mg each day. Follow the tipsbelow to help reduce your sodium intake. What are tips for eating less sodium? Reading food labels  Check the food label before buying or using packaged ingredients. Always check the label for the serving size and sodium content. Look for products with no more than 140 mg of sodium in one serving. Check the % Daily Value column to see what percent of the daily recommended amount of sodium is provided in one serving of the product. Foods with 5% or less in this column are considered low in sodium. Foods with 20% or higher are considered high in sodium. Do not choose foods with salt as one of the first three ingredients on the ingredients list. If salt is one of the first three ingredients, it usually means the item is high in sodium.  Shopping Buy sodium-free or low-sodium products. Look for the following words on food labels: Low-sodium. Sodium-free. Reduced-sodium. No salt added. Unsalted. Always check the sodium content even if foods are labeled as low-sodium or no salt added. Buy fresh foods. Cooking Use herbs, seasonings without salt, and spices as substitutes for salt. Use sodium-free baking soda when baking. Grill, braise, or roast foods to add flavor with less salt. Avoid adding salt to pasta, rice, or hot cereals. Drain and rinse canned vegetables, beans, and meat before use. Avoid adding salt when cooking sweets and desserts. Cook with  low-sodium ingredients. What foods are high in sodium? Vegetables Regular canned vegetables (not low-sodium or reduced-sodium). Sauerkraut, pickled vegetables, and relishes. Olives. French fries. Onion rings. Regular canned tomato sauce and paste. Regular tomato and vegetable juice. Frozenvegetables in sauces. Grains Instant hot cereals. Bread stuffing, pancake, and biscuit mixes. Croutons. Seasoned rice or pasta mixes. Noodle soup cups. Boxed or frozen macaroni and cheese. Regular salted crackers. Self-rising flour. Rolls. Bagels. Flourtortillas and wraps. Meats and other proteins Meat or fish that is salted, canned, smoked, cured, spiced, or pickled. This includes bacon, ham, sausages, hot dogs, corned beef, chipped beef, meat loaves, salt pork, jerky, pickled herring, anchovies, regular canned tuna, andsardines. Salted nuts. Dairy Processed cheese and cheese spreads. Cheese curds. Blue cheese. Feta cheese.String cheese. Regular cottage cheese. Buttermilk. Canned milk. The items listed above may not be a complete list of foods high in sodium. Actual amounts of sodium may be different depending on processing. Contact a dietitian for more information. What foods are low in sodium? Fruits Fresh, frozen, or canned fruit with no sauce added. Fruit juice. Vegetables Fresh or frozen vegetables with no sauce added. "No salt added" canned vegetables. "No salt added" tomato sauce and paste. Low-sodium orreduced-sodium tomato and vegetable juice. Grains Noodles, pasta, quinoa, rice. Shredded or puffed wheat or puffed rice. Regular or quick oats (not instant). Low-sodium crackers. Low-sodium bread. Whole-grainbread and whole-grain pasta. Unsalted popcorn. Meats and other proteins Fresh or frozen whole meats, poultry (not injected with sodium), and fish with no sauce added. Unsalted nuts. Dried peas, beans, and   lentils without added salt. Unsalted canned beans. Eggs. Unsalted nut butters. Low-sodium canned  tunaor chicken. Dairy Milk. Soy milk. Yogurt. Low-sodium cheeses, such as Swiss, Monterey Jack, mozzarella, and ricotta. Sherbet or ice cream (keep to  cup per serving).Cream cheese. Fats and oils Unsalted butter or margarine. Other foods Homemade pudding. Sodium-free baking soda and baking powder. Herbs and spices.Low-sodium seasoning mixes. Beverages Coffee and tea. Carbonated beverages. The items listed above may not be a complete list of foods low in sodium. Actual amounts of sodium may be different depending on processing. Contact a dietitian for more information. What are some salt alternatives when cooking? The following are herbs, seasonings, and spices that can be used instead of salt to flavor your food. Herbs should be fresh or dried. Do not choose packaged mixes. Next to the name of the herb, spice, or seasoning aresome examples of foods you can pair it with. Herbs Bay leaves - Soups, meat and vegetable dishes, and spaghetti sauce. Basil - Italian dishes, soups, pasta, and fish dishes. Cilantro - Meat, poultry, and vegetable dishes. Chili powder - Marinades and Mexican dishes. Chives - Salad dressings and potato dishes. Cumin - Mexican dishes, couscous, and meat dishes. Dill - Fish dishes, sauces, and salads. Fennel - Meat and vegetable dishes, breads, and cookies. Garlic (do not use garlic salt) - Italian dishes, meat dishes, salad dressings, and sauces. Marjoram - Soups, potato dishes, and meat dishes. Oregano - Pizza and spaghetti sauce. Parsley - Salads, soups, pasta, and meat dishes. Rosemary - Italian dishes, salad dressings, soups, and red meats. Saffron - Fish dishes, pasta, and some poultry dishes. Sage - Stuffings and sauces. Tarragon - Fish and poultry dishes. Thyme - Stuffing, meat, and fish dishes. Seasonings Lemon juice - Fish dishes, poultry dishes, vegetables, and salads. Vinegar - Salad dressings, vegetables, and fish dishes. Spices Cinnamon - Sweet  dishes, such as cakes, cookies, and puddings. Cloves - Gingerbread, puddings, and marinades for meats. Curry - Vegetable dishes, fish and poultry dishes, and stir-fry dishes. Ginger - Vegetable dishes, fish dishes, and stir-fry dishes. Nutmeg - Pasta, vegetables, poultry, fish dishes, and custard. Summary Cooking with less salt is one way to reduce the amount of sodium that you get from food. Buy sodium-free or low-sodium products. Check the food label before using or buying packaged ingredients. Use herbs, seasonings without salt, and spices as substitutes for salt in foods. This information is not intended to replace advice given to you by your health care provider. Make sure you discuss any questions you have with your healthcare provider. Document Revised: 04/25/2019 Document Reviewed: 04/25/2019 Elsevier Patient Education  2022 [employer]  

## 2021-03-03 NOTE — Progress Notes (Signed)
Chief Complaint:   OBESITY Cassandra Campos is here to discuss her progress with her obesity treatment plan along with follow-up of her obesity related diagnoses. See Medical Weight Management Flowsheet for complete bioelectrical impedance results.  Today's visit was #: 11 Starting weight: 228 lbs Starting date: 08/30/2019 Weight change since last visit: 4 lbs Total lbs lost to date: 13 lbs Total weight loss percentage to date: -5.70%  Nutrition Plan: Lower carbohydrate, vegetable and lean protein rich diet plan for 0% of the time. Activity: Walking/aerobics for 15-45 minutes 3 times per week. Anti-obesity medications: Ozempic 1 mg subcutaneously weekly. Reported side effects: None.  Interim History: Cassandra Campos's average FBS is 110.  Insulin 180, 140.  Consider decreasing insulin by 5-10 units.  Assessment/Plan:   1. Type 2 diabetes mellitus with chronic kidney disease on chronic dialysis, with long-term current use of insulin (HCC) Diabetes Mellitus: At goal. Medication: Humulin R insulin, Ozempic 1 mg subcutaneously weekly. Issues reviewed: blood sugar goals, complications of diabetes mellitus, hypoglycemia prevention and treatment, exercise, and nutrition.  Plan: The patient will continue to focus on protein-rich, low simple carbohydrate foods. We reviewed the importance of hydration, regular exercise for stress reduction, and restorative sleep.   Lab Results  Component Value Date   HGBA1C 6.3 (H) 02/02/2021   HGBA1C 8.5 (A) 07/30/2020   HGBA1C 7.9 (H) 01/08/2020   Lab Results  Component Value Date   MICROALBUR 150 08/25/2020   LDLCALC 63 08/25/2020   CREATININE 7.83 (H) 12/18/2020   2. ESRD (end stage renal disease) ([place]) Cassandra Campos is on dialysis and the transplant list.  She needs her BMI below 40.  3. Hyperlipidemia associated with type 2 diabetes mellitus ([place]) Course: Not at goal. Lipid-lowering medications: Crestor 20 mg daily.   Plan: Dietary changes: Increase soluble  fiber, decrease simple carbohydrates, decrease saturated fat. Exercise changes: Moderate to vigorous-intensity aerobic activity 150 minutes per week or as tolerated. We will continue to monitor along with PCP/specialists as it pertains to her weight loss journey.  Lab Results  Component Value Date   CHOL 183 08/25/2020   HDL 39 (L) 08/25/2020   LDLCALC 63 08/25/2020   TRIG 536 (H) 08/25/2020   CHOLHDL 4.7 (H) 08/25/2020   Lab Results  Component Value Date   ALT 43 12/18/2020   AST 34 12/18/2020   ALKPHOS 51 12/18/2020   BILITOT 0.9 12/18/2020   The 10-year ASCVD risk score (Arnett DK, et al., 2019) is: 5.1%   Values used to calculate the score:     Age: 50 years     Sex: Female     Is Non-Hispanic African American: Yes     Diabetic: Yes     Tobacco smoker: No     Systolic Blood Pressure: 413 mmHg     Is BP treated: No     HDL Cholesterol: 39 mg/dL     Total Cholesterol: 183 mg/dL  4. NAFLD (nonalcoholic fatty liver disease) Lab results reviewed with patient.   Plan: The mainstay of treatment of NAFLD includes lifestyle modification to achieve weight loss, at least 7% of current body weight. Low carbohydrate diets can be beneficial in improving NAFLD liver histology. Additionally, exercise, even the absence of weight loss can have beneficial effects on the patient's metabolic profile and liver health.  5. Obesity, current BMI 38.2  Course: Cassandra Campos is currently in the action stage of change. As such, her goal is to continue with weight loss efforts.   Nutrition goals: She has  agreed to following a lower carbohydrate, vegetable and lean protein rich diet plan.   Exercise goals:  As is.  Behavioral modification strategies: increasing lean protein intake, decreasing simple carbohydrates, and increasing vegetables.  Cassandra Campos has agreed to follow-up with our clinic in 4 weeks. She was informed of the importance of frequent follow-up visits to maximize her success with intensive  lifestyle modifications for her multiple health conditions.   Objective:   Blood pressure 116/70, pulse 92, temperature 98.1 F (36.7 C), temperature source Oral, height 5\' 3"  (1.6 m), weight 215 lb (97.5 kg), SpO2 100 %. Body mass index is 38.09 kg/m.  General: Cooperative, alert, well developed, in no acute distress. HEENT: Conjunctivae and lids unremarkable. Cardiovascular: Regular rhythm.  Lungs: Normal work of breathing. Neurologic: No focal deficits.   Lab Results  Component Value Date   CREATININE 7.83 (H) 12/18/2020   BUN 23 (H) 12/18/2020   NA 133 (L) 12/18/2020   K 3.5 12/18/2020   CL 89 (L) 12/18/2020   CO2 31 12/18/2020   Lab Results  Component Value Date   ALT 43 12/18/2020   AST 34 12/18/2020   ALKPHOS 51 12/18/2020   BILITOT 0.9 12/18/2020   Lab Results  Component Value Date   HGBA1C 6.3 (H) 02/02/2021   HGBA1C 8.5 (A) 07/30/2020   HGBA1C 7.9 (H) 01/08/2020   HGBA1C 8.7 (A) 09/11/2019   HGBA1C 10.9 (H) 04/19/2019   Lab Results  Component Value Date   TSH 1.010 12/18/2020   Lab Results  Component Value Date   CHOL 183 08/25/2020   HDL 39 (L) 08/25/2020   LDLCALC 63 08/25/2020   TRIG 536 (H) 08/25/2020   CHOLHDL 4.7 (H) 08/25/2020   Lab Results  Component Value Date   WBC 5.6 12/18/2020   HGB 10.4 (L) 12/18/2020   HCT 30.3 (L) 12/18/2020   MCV 91.0 12/18/2020   PLT 185 12/18/2020   Lab Results  Component Value Date   IRON 32 12/11/2020   TIBC 276 12/11/2020   FERRITIN 3,133 (H) 12/11/2020   Attestation Statements:   Reviewed by clinician on day of visit: allergies, medications, problem list, medical history, surgical history, family history, social history, and previous encounter notes.  I, Water quality scientist, CMA, am acting as transcriptionist for Briscoe Deutscher, DO  I have reviewed the above documentation for accuracy and completeness, and I agree with the above. -  Briscoe Deutscher, DO, MS, FAAFP, DABOM - Family and Bariatric Medicine.

## 2021-03-04 LAB — HM DIABETES EYE EXAM

## 2021-03-23 ENCOUNTER — Ambulatory Visit (INDEPENDENT_AMBULATORY_CARE_PROVIDER_SITE_OTHER): Payer: Medicare Other | Admitting: Physician Assistant

## 2021-03-23 ENCOUNTER — Other Ambulatory Visit: Payer: Self-pay | Admitting: Internal Medicine

## 2021-03-23 ENCOUNTER — Other Ambulatory Visit: Payer: Self-pay

## 2021-03-23 ENCOUNTER — Encounter: Payer: Self-pay | Admitting: Physician Assistant

## 2021-03-23 ENCOUNTER — Encounter: Payer: Self-pay | Admitting: Vascular Surgery

## 2021-03-23 VITALS — BP 136/81 | HR 75 | Temp 97.3°F | Resp 20 | Ht 63.0 in | Wt 217.5 lb

## 2021-03-23 DIAGNOSIS — R0683 Snoring: Secondary | ICD-10-CM | POA: Insufficient documentation

## 2021-03-23 DIAGNOSIS — N186 End stage renal disease: Secondary | ICD-10-CM | POA: Diagnosis not present

## 2021-03-23 DIAGNOSIS — Z6841 Body Mass Index (BMI) 40.0 and over, adult: Secondary | ICD-10-CM | POA: Insufficient documentation

## 2021-03-23 DIAGNOSIS — G4700 Insomnia, unspecified: Secondary | ICD-10-CM | POA: Insufficient documentation

## 2021-03-23 DIAGNOSIS — Z79899 Other long term (current) drug therapy: Secondary | ICD-10-CM | POA: Insufficient documentation

## 2021-03-23 NOTE — Progress Notes (Signed)
Established Dialysis Access   History of Present Illness   Cassandra Campos is a 50 y.o. 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 Her fistula was initially created in February of 2017 by Dr. Kellie Campos. She later in October of 2018 had to have a revision by Dr. Oneida Campos due to poor maturation of her fistula. She says that fistula has been working well for her since.  She dialyzes at home on Mon/Tues/ Thurs/ Friday in the evenings. Her dialysis center is next door at [address]. She had her routine follow up recently with Dr. Joelyn Campos and and they advised her to have her aneurysms evaluated due to concerns of enlargement. She reports that it seems to be getting larger and she has had a couple episodes of bleeding after dialysis. She additionally has numbness in her left arm/ hand. She says this has been present since surgery but is worse now. Says she has to keep her left arm down at her side or it will be come numb. She doesn't have any pain or weakness in her hand.   Current Outpatient Medications  Medication Sig Dispense Refill   allopurinol (ZYLOPRIM) 300 MG tablet TAKE 1 TABLET BY MOUTH EVERY MORNING 90 tablet 1   B Complex-C-Folic Acid (RENAL) 1 MG CAPS daily at 2 am.     cinacalcet (SENSIPAR) 60 MG tablet Take 60 mg by mouth daily. Every other day     Continuous Blood Gluc Receiver (FREESTYLE LIBRE 2 READER) DEVI 1 each by Does not apply route daily. 1 each 0   Continuous Blood Gluc Sensor (FREESTYLE LIBRE 2 SENSOR) MISC 1 each by Does not apply route every 14 (fourteen) days. 6 each 3   Insulin Pen Needle 32G X 4 MM MISC Use 2x a day 200 each 3   insulin regular human CONCENTRATED (HUMULIN R U-500 KWIKPEN) 500 UNIT/ML kwikpen Inject 200 units before breakfast 160-170 units before dinner 16 mL 11   lanthanum (FOSRENOL) 1000 MG chewable tablet Chew 1,000 mg by mouth 3 (three) times daily with meals.     lidocaine-prilocaine (EMLA)  cream USE ON ARAMS AS NEEDED FOR DIALYSIS  10   Microlet Lancets MISC Use as directed to check blood sugars 4 times per day dx: e11.22 300 each 2   MITIGARE 0.6 MG CAPS Monday, Wednesday, and Friday 90 capsule 0   pregabalin (LYRICA) 100 MG capsule Take 1 capsule (100 mg total) by mouth daily. In the morning 90 capsule 1   rosuvastatin (CRESTOR) 20 MG tablet Take 1 tablet (20 mg total) by mouth daily. 90 tablet 3   No current facility-administered medications for this visit.    On ROS today: negative unless state in HPI   Physical Examination   Vitals:   03/23/21 1021  BP: 136/81  Pulse: 75  Resp: 20  Temp: (!) 97.3 F (36.3 C)  TempSrc: Temporal  SpO2: 100%  Weight: 217 lb 8 oz (98.7 kg)  Height: 5\' 3"  (1.6 m)   Body mass index is 38.53 kg/m.  General Cardiac Extremity Well appearing, well nourished in no distress Regular rate and rhythm Left upper extremity AV fistula is diffusely aneurysmal. Fistula becomes deep in left upper arm and tortuous. Fistula has good thrill but also is pulsatile. She has one area in mid fistula with some thinned skin and small scab present. 1+ radial pulse. Left hand is warm. Normal motor and sensation      Medical Decision  Making   Cassandra Campos is a 50 y.o. female who presents for evaluation of aneurysm of left AV fistula. Fistula is diffusely aneurysmal but it does have a small area of scab that has bled recently. This is concerning for continued bleeding or degeneration. Additionally fistula is pulsatile. I have recommended fistulogram prior to any surgical revision at this time as I suspect she has some stenosis in the fistula somewhere. Following the fistulogram she may need plication of the aneurysm. I discussed that she would likely need TDC at that time as it would not leave enough space for her to continue to dialyze while she is healing. In discussing with her that we would have to create new fistula if she cannot tolerate the  numbness in her hand she feels presently this is tolerable.  - Will schedule her for a fistulogram in the near future. She has had previous procedures by Dr. Kellie Campos and Dr.Fields who are both now retired so will arrange for this to be done with MD with next earliest availability in [place], PA-C [facility] [place]: [facility] MD: Joycelyn Das

## 2021-03-24 ENCOUNTER — Other Ambulatory Visit: Payer: Self-pay

## 2021-03-30 ENCOUNTER — Encounter (INDEPENDENT_AMBULATORY_CARE_PROVIDER_SITE_OTHER): Payer: Self-pay

## 2021-04-01 ENCOUNTER — Other Ambulatory Visit: Payer: Self-pay

## 2021-04-01 ENCOUNTER — Encounter (HOSPITAL_COMMUNITY)
Admission: RE | Disposition: A | Discharge status: Home / Self Care | Payer: Self-pay | Source: Home / Self Care | Attending: Vascular Surgery

## 2021-04-01 ENCOUNTER — Ambulatory Visit (INDEPENDENT_AMBULATORY_CARE_PROVIDER_SITE_OTHER): Payer: Medicare Other | Admitting: Family Medicine

## 2021-04-01 ENCOUNTER — Ambulatory Visit (HOSPITAL_COMMUNITY)
Admission: RE | Admit: 2021-04-01 | Discharge: 2021-04-01 | Disposition: A | Discharge status: Home / Self Care | Payer: Medicare Other | Attending: Vascular Surgery | Admitting: Vascular Surgery

## 2021-04-01 DIAGNOSIS — T82510A Breakdown (mechanical) of surgically created arteriovenous fistula, initial encounter: Secondary | ICD-10-CM | POA: Diagnosis present

## 2021-04-01 DIAGNOSIS — Z992 Dependence on renal dialysis: Secondary | ICD-10-CM | POA: Diagnosis not present

## 2021-04-01 DIAGNOSIS — T82856A Stenosis of peripheral vascular stent, initial encounter: Secondary | ICD-10-CM

## 2021-04-01 DIAGNOSIS — N186 End stage renal disease: Secondary | ICD-10-CM | POA: Insufficient documentation

## 2021-04-01 DIAGNOSIS — T82838A Hemorrhage of vascular prosthetic devices, implants and grafts, initial encounter: Secondary | ICD-10-CM

## 2021-04-01 DIAGNOSIS — Y841 Kidney dialysis as the cause of abnormal reaction of the patient, or of later complication, without mention of misadventure at the time of the procedure: Secondary | ICD-10-CM | POA: Diagnosis not present

## 2021-04-01 DIAGNOSIS — T82898A Other specified complication of vascular prosthetic devices, implants and grafts, initial encounter: Secondary | ICD-10-CM

## 2021-04-01 HISTORY — PX: PERIPHERAL VASCULAR BALLOON ANGIOPLASTY: CATH118281

## 2021-04-01 HISTORY — PX: A/V FISTULAGRAM: CATH118298

## 2021-04-01 LAB — POCT I-STAT, CHEM 8
BUN: 28 mg/dL — ABNORMAL HIGH (ref 6–20)
Calcium, Ion: 1.11 mmol/L — ABNORMAL LOW (ref 1.15–1.40)
Chloride: 95 mmol/L — ABNORMAL LOW (ref 98–111)
Creatinine, Ser: 8.3 mg/dL — ABNORMAL HIGH (ref 0.44–1.00)
Glucose, Bld: 112 mg/dL — ABNORMAL HIGH (ref 70–99)
HCT: 33 % — ABNORMAL LOW (ref 36.0–46.0)
Hemoglobin: 11.2 g/dL — ABNORMAL LOW (ref 12.0–15.0)
Potassium: 3.7 mmol/L (ref 3.5–5.1)
Sodium: 137 mmol/L (ref 135–145)
TCO2: 30 mmol/L (ref 22–32)

## 2021-04-01 SURGERY — A/V FISTULAGRAM
Anesthesia: LOCAL

## 2021-04-01 MED ORDER — HEPARIN (PORCINE) IN NACL 1000-0.9 UT/500ML-% IV SOLN
INTRAVENOUS | Status: AC
  Filled 2021-04-01: qty 500

## 2021-04-01 MED ORDER — IODIXANOL 320 MG/ML IV SOLN
INTRAVENOUS | Status: DC | PRN
  Administered 2021-04-01: 70 mL

## 2021-04-01 MED ORDER — SODIUM CHLORIDE 0.9 % IV SOLN: 250.0000 mL | INTRAVENOUS | Status: DC | PRN

## 2021-04-01 MED ORDER — SODIUM CHLORIDE 0.9% FLUSH: 3.0000 mL | Freq: Two times a day (BID) | INTRAVENOUS | Status: DC

## 2021-04-01 MED ORDER — SODIUM CHLORIDE 0.9% FLUSH: 3.0000 mL | INTRAVENOUS | Status: DC | PRN

## 2021-04-01 MED ORDER — HEPARIN (PORCINE) IN NACL 1000-0.9 UT/500ML-% IV SOLN
INTRAVENOUS | Status: DC | PRN
  Administered 2021-04-01: 500 mL

## 2021-04-01 MED ORDER — LIDOCAINE HCL (PF) 1 % IJ SOLN
INTRAMUSCULAR | Status: DC | PRN
  Administered 2021-04-01 (×2): 2 mL

## 2021-04-01 MED ORDER — LIDOCAINE HCL (PF) 1 % IJ SOLN
INTRAMUSCULAR | Status: AC
  Filled 2021-04-01: qty 30

## 2021-04-01 SURGICAL SUPPLY — 13 items
BALLN MUSTANG 8.0X40 75 (BALLOONS) ×3
BALLOON MUSTANG 8.0X40 75 (BALLOONS) ×2 IMPLANT
COVER DOME SNAP 22 D (MISCELLANEOUS) ×3 IMPLANT
KIT ENCORE 26 ADVANTAGE (KITS) ×3 IMPLANT
KIT MICROPUNCTURE NIT STIFF (SHEATH) ×3 IMPLANT
PROTECTION STATION PRESSURIZED (MISCELLANEOUS) ×3
SHEATH PINNACLE R/O II 6F 4CM (SHEATH) ×3 IMPLANT
SHEATH PROBE COVER 6X72 (BAG) ×3 IMPLANT
STATION PROTECTION PRESSURIZED (MISCELLANEOUS) ×2 IMPLANT
STOPCOCK MORSE 400PSI 3WAY (MISCELLANEOUS) ×3 IMPLANT
TRAY PV CATH (CUSTOM PROCEDURE TRAY) ×3 IMPLANT
TUBING CIL FLEX 10 FLL-RA (TUBING) ×3 IMPLANT
WIRE BENTSON .035X145CM (WIRE) ×3 IMPLANT

## 2021-04-01 NOTE — Progress Notes (Deleted)
Pt was seen and examined in preop holding prior to left arm fistulagram of previous L brachiocephalic fistula.  No complaints.  Has appreciated pulsatility and some post-dialysis bleeding concerning for central stenosis.  Palpable pulse at the wrist No changes in medications.  After discussing the risks and benefits of left arm fistulagram to improve fistula flow, Cassandra Campos elected to proceed.   [name] MD    History of Present Illness   Cassandra Campos is a 50 y.o. 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 Her fistula was initially created in February of 2017 by Dr. Kellie Simmering. She later in October of 2018 had to have a revision by Dr. Oneida Alar due to poor maturation of her fistula. She says that fistula has been working well for her since.  She dialyzes at home on Mon/Tues/ Thurs/ Friday in the evenings. Her dialysis center is next door at [address]. She had her routine follow up recently with Dr. Joelyn Oms and and they advised her to have her aneurysms evaluated due to concerns of enlargement. She reports that it seems to be getting larger and she has had a couple episodes of bleeding after dialysis. She additionally has numbness in her left arm/ hand. She says this has been present since surgery but is worse now. Says she has to keep her left arm down at her side or it will be come numb. She doesn't have any pain or weakness in her hand.   Current Facility-Administered Medications  Medication Dose Route Frequency Provider Last Rate Last Admin   0.9 %  sodium chloride infusion  250 mL Intravenous PRN [name], MD       sodium chloride flush (NS) 0.9 % injection 3 mL  3 mL Intravenous Q12H [name], MD       sodium chloride flush (NS) 0.9 % injection 3 mL  3 mL Intravenous PRN [name], MD        On ROS today: negative unless state in HPI   Physical Examination   Vitals:   04/01/21 0634  BP:  111/68  Pulse: 93  Resp: 17  Temp: 98.5 F (36.9 C)  TempSrc: Oral  SpO2: 100%  Weight: 98.4 kg  Height: 5' 2.5" (1.588 m)   Body mass index is 39.06 kg/m.  General Cardiac Extremity Well appearing, well nourished in no distress Regular rate and rhythm Left upper extremity AV fistula is diffusely aneurysmal. Fistula becomes deep in left upper arm and tortuous. Fistula has good thrill but also is pulsatile. She has one area in mid fistula with some thinned skin and small scab present. 1+ radial pulse. Left hand is warm. Normal motor and sensation      Medical Decision Making   Cassandra Campos is a 50 y.o. female who presents for evaluation of aneurysm of left AV fistula. Fistula is diffusely aneurysmal but it does have a small area of scab that has bled recently. This is concerning for continued bleeding or degeneration. Additionally fistula is pulsatile. I have recommended fistulogram prior to any surgical revision at this time as I suspect she has some stenosis in the fistula somewhere. Following the fistulogram she may need plication of the aneurysm. I discussed that she would likely need TDC at that time as it would not leave enough space for her to continue to dialyze while she is healing. In discussing with her that we would have to create new fistula  if she cannot tolerate the numbness in her hand she feels presently this is tolerable.  - Will schedule her for a fistulogram in the near future. She has had previous procedures by Dr. Kellie Simmering and Dr.Fields who are both now retired so will arrange for this to be done with MD with next earliest availability in [place], PA-C [facility] [place] MD: Joycelyn Das

## 2021-04-01 NOTE — Op Note (Signed)
    Patient name: Cassandra Campos MRN: [id number] DOB: 1970/08/09 Sex: female  04/01/2021 Pre-operative Diagnosis: End-stage renal disease Post-operative diagnosis:  Same Surgeon:  [name], MD Procedure Performed: 1.  Ultrasound-guided micropuncture access of the left brachiocephalic arteriovenous fistula. 2.  Fistulogram 3.  8 x 60 cm balloon angioplasty of the fistula   Indications:   [name] is a 50 y.o. 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 Her fistula was initially created in February of 2017 by Dr. Kellie Simmering. She later in October of 2018 had to have a revision by Dr. Oneida Alar due to poor maturation of her fistula. She says that fistula has been working well for her since.  She dialyzes at home on Mon/Tues/ Thurs/ Friday in the evenings. Her dialysis center is next door at [address]. She had her routine follow up recently with Dr. Joelyn Oms and and they advised her to have her aneurysms evaluated due to concerns of enlargement. She reports that it seems to be getting larger and she has had a couple episodes of bleeding after dialysis, from the most distal aneurysmal segment.  Findings:   No flow-limiting stenosis in the left brachiocephalic vein, subclavian vein, axillary vein. No stenosis at the confluence of the axillary and cephalic veins. Aneurysmal, left brachiocephalic fistula with area of stenosis 5 cm from the anastomosis. This area of stenosis is proximal to the aneurysm that has been accessed at dialysis with prolonged bleeding.  No anastomotic inflow stenosis was appreciated, with normal-appearing brachial artery, ulnar and radial arteries in the upper forearm.   Procedure:  The patient was identified in the holding area and taken to room 8.  She was placed supine on the table and prepped and draped in the usual sterile fashion.  A time out was called.  Ultrasound was used to evaluate the left  brachiocephalic fistula.  The site was anesthetized with local anesthetic and accessed with ultrasound guidance with a micropuncture needle.  Fistulogram followed.  Please see above results.  I was worried that my initial access point was encroaching on the stenotic portion of the fistula and would not leave enough room for balloon angioplasty.  Therefore, I pulled the 5 French micro sheath, and re-accessed 4 cm more distal.  Follow up angiography demonstrated better visualization of the stenotic portion of the fistula.  A short 6 French sheath was brought onto the field followed by 8 x 60 cm angioplasty balloon.  This was inflated for 2 minutes across the stenotic portion. A waist was initially appreciated in the balloon, resolving over the 2-minute inflation.  Follow-up angiogram demonstrated excellent result with resolution of the previous stenosis.  This was an excellent result.  The access point closed using Monocryl suture without issue.   Cassandria Santee, MD [facility] [place] Office: [phone]

## 2021-04-01 NOTE — H&P (Signed)
Pt was seen and examined in preop holding prior to left arm fistulagram of previous L brachiocephalic fistula.  No complaints.  Has appreciated pulsatility and some post-dialysis bleeding concerning for central stenosis.  Palpable pulse at the wrist No changes in medications.  After discussing the risks and benefits of left arm fistulagram to improve fistula flow, [name] elected to proceed.    Cassandra John MD       History of Present Illness    Cassandra Campos is a 50 y.o. 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 Her fistula was initially created in February of 2017 by Dr. Kellie Simmering. She later in October of 2018 had to have a revision by Dr. Oneida Alar due to poor maturation of her fistula. She says that fistula has been working well for her since.  She dialyzes at home on Mon/Tues/ Thurs/ Friday in the evenings. Her dialysis center is next door at [address]. She had her routine follow up recently with Dr. Joelyn Oms and and they advised her to have her aneurysms evaluated due to concerns of enlargement. She reports that it seems to be getting larger and she has had a couple episodes of bleeding after dialysis. She additionally has numbness in her left arm/ hand. She says this has been present since surgery but is worse now. Says she has to keep her left arm down at her side or it will be come numb. She doesn't have any pain or weakness in her hand.             Current Facility-Administered Medications  Medication Dose Route Frequency Provider Last Rate Last Admin   0.9 %  sodium chloride infusion  250 mL Intravenous PRN Cassandra John, MD       sodium chloride flush (NS) 0.9 % injection 3 mL  3 mL Intravenous Q12H Cassandra John, MD       sodium chloride flush (NS) 0.9 % injection 3 mL  3 mL Intravenous PRN Cassandra John, MD          On ROS today: negative unless state in HPI     Physical Examination       Vitals:     04/01/21 0634  BP: 111/68  Pulse: 93  Resp: 17  Temp: 98.5 F (36.9 C)  TempSrc: Oral  SpO2: 100%  Weight: 98.4 kg  Height: 5' 2.5" (1.588 m)    Body mass index is 39.06 kg/m.   General Cardiac Extremity Well appearing, well nourished in no distress Regular rate and rhythm Left upper extremity AV fistula is diffusely aneurysmal. Fistula becomes deep in left upper arm and tortuous. Fistula has good thrill but also is pulsatile. She has one area in mid fistula with some thinned skin and small scab present. 1+ radial pulse. Left hand is warm. Normal motor and sensation         Medical Decision Making    Cassandra Campos is a 50 y.o. female who presents for evaluation of aneurysm of left AV fistula. Fistula is diffusely aneurysmal but it does have a small area of scab that has bled recently. This is concerning for continued bleeding or degeneration. Additionally fistula is pulsatile. I have recommended fistulogram prior to any surgical revision at this time as I suspect she has some stenosis in the fistula somewhere. Following the fistulogram she may need plication of the aneurysm. I discussed that she would likely need TDC at that  time as it would not leave enough space for her to continue to dialyze while she is healing. In discussing with her that we would have to create new fistula if she cannot tolerate the numbness in her hand she feels presently this is tolerable.  - Will schedule her for a fistulogram in the near future. She has had previous procedures by Dr. Kellie Simmering and Dr.Fields who are both now retired so will arrange for this to be done with MD with next earliest availability in [place], PA-C [facility] [place]: [facility] MD: Joycelyn Das

## 2021-04-02 ENCOUNTER — Encounter (HOSPITAL_COMMUNITY): Payer: Self-pay | Admitting: Vascular Surgery

## 2021-04-07 ENCOUNTER — Telehealth: Payer: BC Managed Care – PPO | Admitting: Adult Health

## 2021-04-08 ENCOUNTER — Ambulatory Visit: Payer: 59 | Admitting: Internal Medicine

## 2021-04-08 NOTE — Progress Notes (Deleted)
Patient ID: Cassandra Campos, female   DOB: [birth year], 50 y.o.   MRN: [id number]   This visit occurred during the SARS-CoV-2 public health emergency.  Safety protocols were in place, including screening questions prior to the visit, additional usage of staff PPE, and extensive cleaning of exam room while observing appropriate contact time as indicated for disinfecting solutions.   HPI: Cassandra Campos is a 50 y.o.-year-old female, returning for follow-up for DM2, dx in 1997, insulin-dependent right after dx, uncontrolled, with complications (ESRD-on HD since 12/2017, DR, PN).  Last visit 4 months ago.  Interim hx: She continues to see the transplant clinic at [facility].  As of now, she continues hemodialysis. Also, continues to see the weight management clinic. No blurry vision, nausea, chest pain.  Reviewed HbA1c levels: Lab Results  Component Value Date   HGBA1C 6.3 (H) 02/02/2021   HGBA1C 8.5 (A) 07/30/2020   HGBA1C 7.9 (H) 01/08/2020   HGBA1C 8.7 (A) 09/11/2019   HGBA1C 10.9 (H) 04/19/2019   HGBA1C 6.9 (H) 11/09/2018   HGBA1C 7.1 (H) 07/11/2018   HGBA1C 8.5 (H) 03/27/2018   HGBA1C 8.2 (H) 02/08/2013  11/18/2020: HbA1c 8.9% 05/26/2020: HbA1c 9.8%  Pt is on a regimen of: - Ozempic 1 mg weekly -restarted 11/2020 - U500 insulin (started 2020) - 180-195 >> 200 units before breakfast - 140-150 units before dinner if dinner is early, and 110-120 units if dinner is after dialysis >> 160-170 >> actually 140-160 units before dinner She was on Levemir and Humalog.  Pt checks her sugars more than 4 times a day with her CGM-started after last visit:  Previously: - am: 78 -226, 407 >> 59-455 >> 97-203 >> 126-180, 200s >> 128-237 - 2h after b'fast: n/c - before lunch: 92-300s >> 180-405 >> 101-245 >> 110-120 >> 125-300 - 2h after lunch: n/c - before dinner: 125-140 >> (6:30-7 pm): 58, 108, 120-150, 200s >> 90-250 - 2h after dinner: n/c - bedtime: n/c   - nighttime:  n/c Lowest sugar was 59 >> 97 >> 58 (before dinner) >> 90; she has hypoglycemia awareness in the 60s. Highest sugar was 455 >> 260 >> 200s >> 300s.  Glucometer: Contour next  Pt's meals are: - Breakfast: yoghurt and blueberries; egg McMuffin - Lunch: fish, tacos, subs, cake - Dinner: salad, steak, shrimp - occasionally after HD at 10 pm, but more recently she has this during dialysis, around 7 PM - Snacks: fruit, peanuts  -+ End-stage renal disease, last BUN/creatinine:  Lab Results  Component Value Date   BUN 28 (H) 04/01/2021   BUN 23 (H) 12/18/2020   CREATININE 8.30 (H) 04/01/2021   CREATININE 7.83 (H) 12/18/2020   She is on hemodialysis: M, Tu, Th, F (home) - in the evening, for 3-4 hours: 6:30 PM to 10 PM She is on the kidney transplant list at [facility]. Requirements for kidney transplant: <200 lbs, HbA1c <8% (? 7%), BMI <40.  -+ HL; last set of lipids: Lab Results  Component Value Date   CHOL 183 08/25/2020   HDL 39 (L) 08/25/2020   LDLCALC 63 08/25/2020   TRIG 536 (H) 08/25/2020   CHOLHDL 4.7 (H) 08/25/2020  On Crestor 20.  - last eye exam was on 03/20/2020: + DR. + h/o retinal detachment, + cataracts.  She gets IO injections.  - she has numbness and tingling in her feet.  She changed from Lyrica to Neurontin 300 mg daily since last visit.  Pt has FH of DM in M, F.  She also has a history of NAFLD, OSA (but not on CPAP, since she could not tolerate it-now on nasal pillows), gout.   ROS:  + numbness and tingling in his feet  Past Medical History:  Diagnosis Date   Anemia associated with chronic renal failure    ASCUS of cervix with negative high risk HPV 06/2017   Back pain    Chest pain    CHF (congestive heart failure) ([place])    CKD (chronic kidney disease), stage IV ([place]) no dialysis yet   nephrologist-  dr Jamal Maes-- scheduled next visit 09-08-2017 (lov 07-05-2017)   Constipation    Diabetic retinopathy ([place])    Dialysis patient ([place])    Edema, lower  extremity    Elevated transaminase level    Family history of breast cancer    Family history of ovarian cancer    Family history of stomach cancer    Fatty liver    FOLLOWED BY DR Henrene Pastor   GERD (gastroesophageal reflux disease)    Hypertension    Idiopathic gout of multiple sites    09-05-2017 per pt stable   Insulin dependent type 2 diabetes mellitus ([place])    followed by dr Toy Care   Joint pain    Mixed hyperlipidemia    OSA (obstructive sleep apnea)    per study 10-20-2015 mild osa w/ AHI 10.3/hr;  09-05-2017 per pt has not used cpap since 1 yr ago   Palpitations    Peripheral neuropathy    feet   S/P arteriovenous (AV) fistula creation 07-04-2015  w/ revision 02-14-2017   left braniocephilic   Shortness of breath    Trigger thumb, right thumb    Umbilical hernia    Vitamin D deficiency    Past Surgical History:  Procedure Laterality Date   A/V FISTULAGRAM N/A 04/01/2021   Procedure: A/V FISTULAGRAM;  Surgeon: [name], MD;  Location: [place] [facility];  Service: Cardiovascular;  Laterality: N/A;   ABDOMINAL HYSTERECTOMY  07/11/2000   dr gott   TAH/BSO for irregular bleeding and leiomyoma   AV FISTULA PLACEMENT Left 07/04/2015   Procedure: ARTERIOVENOUS (AV) FISTULA CREATION-LEFT;  Surgeon: Mal Misty, MD;  Location: [place];  Service: Vascular;  Laterality: Left;   BREAST EXCISIONAL BIOPSY Right    benign   CARPAL TUNNEL RELEASE Bilateral 1993 and [place]:  09-25-1999   BILATERAL TUBAL LIGATION W/ LAST C/S   DILATION AND CURETTAGE OF UTERUS     EXPLORATORY [place] / [place] ADHESIONS/  BILATERAL SALPINGOOPHORECTOMY  07-20-2011  dr s. Rhodia Albright  [facility]   FISTULA SUPERFICIALIZATION Left 08/28/2015   Procedure: BRACHIOCEPHALIC FISTULA SUPERFICIALIZATION;  Surgeon: Mal Misty, MD;  Location: [place];  Service: Vascular;  Laterality: Left;   PATCH ANGIOPLASTY Left 02/14/2017   Procedure: PATCH ANGIOPLASTY;  Surgeon: Elam Dutch, MD;  Location:  [place];  Service: Vascular;  Laterality: Left;   PELVIC LAPAROSCOPY  1994   exploration for ectopic preg.   PERIPHERAL VASCULAR BALLOON ANGIOPLASTY  04/01/2021   Procedure: PERIPHERAL VASCULAR BALLOON ANGIOPLASTY;  Surgeon: [name], MD;  Location: [place] [facility];  Service: Cardiovascular;;   RETINAL DETACHMENT SURGERY Left 2015   REVISON OF ARTERIOVENOUS FISTULA Left 02/14/2017   Procedure: REVISON OF ARTERIOVENOUS FISTULA  LEFT ARM;  Surgeon: Elam Dutch, MD;  Location: [place];  Service: Vascular;  Laterality: Left;   TRIGGER FINGER RELEASE Left 2015   thumb   TRIGGER FINGER  RELEASE Right 09/09/2017   Procedure: RELEASE TRIGGER FINGER/A-1 PULLEY RIGHT THUMB;  Surgeon: Dorna Leitz, MD;  Location: [place];  Service: Orthopedics;  Laterality: Right;   Social History   Socioeconomic History   Marital status: Married    Spouse name: Not on file   Number of children: 2   Years of education: Not on file   Highest education level: Not on file  Occupational History   Occupation: medical billing and insurance  Tobacco Use   Smoking status: Never   Smokeless tobacco: Never  Vaping Use   Vaping Use: Never used  Substance and Sexual Activity   Alcohol use: Yes    Alcohol/week: 0.0 standard drinks    Comment: Very rare   Drug use: No   Sexual activity: Yes    Birth control/protection: Surgical    Comment: HYST-1st intercourse 50 yo-Fewer than 5 partners  Other Topics Concern   Not on file  Social History Narrative   Not on file   Social Determinants of Health   Financial Resource Strain: Not on file  Food Insecurity: Not on file  Transportation Needs: Not on file  Physical Activity: Not on file  Stress: Not on file  Social Connections: Not on file  Intimate Partner Violence: Not on file   Current Outpatient Medications on File Prior to Visit  Medication Sig Dispense Refill   allopurinol (ZYLOPRIM) 300 MG tablet TAKE 1 TABLET BY MOUTH EVERY  MORNING (Patient taking differently: Take 300 mg by mouth at bedtime.) 90 tablet 1   B Complex-C-Folic Acid (RENAL) 1 MG CAPS Take 1 capsule by mouth at bedtime.     cinacalcet (SENSIPAR) 60 MG tablet Take 60 mg by mouth at bedtime.     Continuous Blood Gluc Receiver (FREESTYLE LIBRE 2 READER) DEVI 1 each by Does not apply route daily. 1 each 0   Continuous Blood Gluc Sensor (FREESTYLE LIBRE 2 SENSOR) MISC 1 each by Does not apply route every 14 (fourteen) days. 6 each 3   Insulin Pen Needle 32G X 4 MM MISC Use 2x a day 200 each 3   insulin regular human CONCENTRATED (HUMULIN R U-500 KWIKPEN) 500 UNIT/ML kwikpen Inject 200 units before breakfast 160-170 units before dinner (Patient taking differently: Inject 140-200 Units into the skin See admin instructions. Inject 200 units subcutaneously before breakfast & inject 140-180 units subcutaneously before dinner) 16 mL 11   lanthanum (FOSRENOL) 1000 MG chewable tablet Chew 1,000 mg by mouth 3 (three) times daily with meals.     lidocaine-prilocaine (EMLA) cream USE ON ARAMS AS NEEDED FOR DIALYSIS  10   Microlet Lancets MISC Use as directed to check blood sugars 4 times per day dx: e11.22 300 each 2   MITIGARE 0.6 MG CAPS Monday, Wednesday, and Friday 90 capsule 0   pregabalin (LYRICA) 100 MG capsule TAKE ONE CAPSULE BY MOUTH EVERY MORNING (Patient taking differently: Take 100 mg by mouth at bedtime.) 90 capsule 0   rosuvastatin (CRESTOR) 20 MG tablet Take 1 tablet (20 mg total) by mouth daily. 90 tablet 3   No current facility-administered medications on file prior to visit.   No Known Allergies Family History  Problem Relation Age of Onset   Diabetes Mother    Hypertension Mother    Thyroid disease Mother    Ovarian cancer Mother        dx 80s   Diabetes Father    Hypertension Father    Cancer Father  STOMACH   Stomach cancer Father 9   Stroke Maternal Grandmother    Stroke Maternal Grandfather    Breast cancer Maternal Aunt 37    Cancer Paternal Grandfather        unknown type, mets to brain   Cancer Other        unknown type, father's first cousin   Cancer Paternal Uncle        unknown type, dx >50   Cancer Cousin        unknown type, dx 60s, paternal first cousin   Cancer Other        unknown types, 4 of mother's first cousins   Colon cancer Neg Hx    Rectal cancer Neg Hx    PE: There were no vitals taken for this visit. Wt Readings from Last 3 Encounters:  04/01/21 217 lb (98.4 kg)  03/23/21 217 lb 8 oz (98.7 kg)  03/02/21 219 lb 9.6 oz (99.6 kg)   Constitutional: overweight, in NAD Eyes: PERRLA, EOMI, no exophthalmos ENT: moist mucous membranes, no thyromegaly, no cervical lymphadenopathy Cardiovascular: RRR, No MRG Respiratory: CTA B Gastrointestinal: abdomen soft, NT, ND, BS+ Musculoskeletal: no deformities, strength intact in all 4 Skin: moist, warm, no rashes Neurological: no tremor with outstretched hands, DTR normal in all 4  ASSESSMENT: 1. DM2, insulin-dependent, uncontrolled, with complications - ESRD, on HD, awaiting kidney transplant ([facility]) - DR - PN  2. HL  3.  Obesity class III  PLAN:  1. Patient with longstanding, uncontrolled, type 2 diabetes, very insulin resistant, on concentrated insulin (U500) and weekly GLP-1 receptor agonist.  At last visit, sugars are quite high and variable.  Upon questioning, she was out of Ozempic for a month that she just obtained it before the visit but did not start it.  I advised her to start 1 mg weekly.  I did advise her to check with her insurance whether the 2 mg Ozempic dose was covered board, if not, if Darcel Bayley would be covered.  We also discussed about how to change her U500 insulin doses depending on the size and consistency of her meals.  She was not checking sugars at that time but she was planning to get the CGM, as recommended at the previous visit.  HbA1c was 8.9% prior to the visit.  However, since then, she had a lower HbA1c of 6.3% in  01/2021 and a slightly higher A1c but still at goal at 6.8% on 02/24/2021. CGM interpretation: -At today's visit, we reviewed her CGM downloads: It appears that *** of values are in target range (goal >70%), while *** are higher than 180 (goal <25%), and *** are lower than 70 (goal <4%).  The calculated average blood sugar is ***.  The projected HbA1c for the next 3 months ([facility]) is ***. -Reviewing the CGM trends, ***  - I suggested to:  Patient Instructions  Please continue: - Ozempic 1 mg weekly - U500 insulin: - 200 units before breakfast - 140-160 units before dinner  Check with your insurance if: - Ozempic 2 mg weekly - Mounjaro 5, 7.5, 10 mg weekly are covered  Please return in 3-4 months.   - we checked her HbA1c: 7%  - advised to check sugars at different times of the day - 4x a day - advised for yearly eye exams >> she is UTD - but due - return to clinic in 3-4 months  2. HL -Reviewed latest lipid panel from 08/2020: LDL at goal but triglycerides very high,  HDL low: Lab Results  Component Value Date   CHOL 183 08/25/2020   HDL 39 (L) 08/25/2020   LDLCALC 63 08/25/2020   TRIG 536 (H) 08/25/2020   CHOLHDL 4.7 (H) 08/25/2020  -Continues on Crestor 20 mg daily without side effects  3.  Obesity class III -She needs a BMI less than 40 to qualify for renal transplant -She is on weight management in the past but the suggested diet was not suited for her due to hemodialysis -We will continue Ozempic which should also help with weight loss -Before last visit she gained 2 pounds  Philemon Kingdom, MD PhD [facility] Endocrinology

## 2021-04-13 ENCOUNTER — Other Ambulatory Visit: Payer: Self-pay

## 2021-04-13 ENCOUNTER — Ambulatory Visit (INDEPENDENT_AMBULATORY_CARE_PROVIDER_SITE_OTHER): Payer: Medicare Other | Admitting: Internal Medicine

## 2021-04-13 ENCOUNTER — Other Ambulatory Visit: Payer: Self-pay | Admitting: Internal Medicine

## 2021-04-13 ENCOUNTER — Encounter: Payer: Self-pay | Admitting: Internal Medicine

## 2021-04-13 ENCOUNTER — Telehealth: Payer: Self-pay | Admitting: Pharmacy Technician

## 2021-04-13 ENCOUNTER — Other Ambulatory Visit (HOSPITAL_COMMUNITY): Payer: Self-pay

## 2021-04-13 VITALS — BP 128/86 | HR 91 | Ht 62.5 in | Wt 219.4 lb

## 2021-04-13 DIAGNOSIS — Z992 Dependence on renal dialysis: Secondary | ICD-10-CM

## 2021-04-13 DIAGNOSIS — E1169 Type 2 diabetes mellitus with other specified complication: Secondary | ICD-10-CM

## 2021-04-13 DIAGNOSIS — N186 End stage renal disease: Secondary | ICD-10-CM

## 2021-04-13 DIAGNOSIS — E785 Hyperlipidemia, unspecified: Secondary | ICD-10-CM

## 2021-04-13 DIAGNOSIS — E1122 Type 2 diabetes mellitus with diabetic chronic kidney disease: Secondary | ICD-10-CM | POA: Diagnosis not present

## 2021-04-13 DIAGNOSIS — Z6838 Body mass index (BMI) 38.0-38.9, adult: Secondary | ICD-10-CM

## 2021-04-13 DIAGNOSIS — Z794 Long term (current) use of insulin: Secondary | ICD-10-CM | POA: Diagnosis not present

## 2021-04-13 MED ORDER — OZEMPIC (2 MG/DOSE) 8 MG/3ML ~~LOC~~ SOPN: 2.0000 mg | PEN_INJECTOR | SUBCUTANEOUS | 3 refills | Status: DC

## 2021-04-13 NOTE — Patient Instructions (Addendum)
Please increase: - Ozempic 2 mg weekly  Decrease U500: - 130-150 units before b'fast - 100-120 units before dinner  Please return in 3-4 months.

## 2021-04-13 NOTE — Progress Notes (Signed)
Patient ID: Cassandra Campos, female   DOB: May 30, 1970, 50 y.o.   MRN: [id number]   This visit occurred during the SARS-CoV-2 public health emergency.  Safety protocols were in place, including screening questions prior to the visit, additional usage of staff PPE, and extensive cleaning of exam room while observing appropriate contact time as indicated for disinfecting solutions.   HPI: Cassandra Campos is a 50 y.o.-year-old female, returning for follow-up for DM2, dx in 1997, insulin-dependent right after dx, uncontrolled, with complications (ESRD-on HD since 12/2017, DR, PN).  Last visit 4.5 months ago.  Interim hx: She continues to see the transplant clinic at [facility].  As of now she continues hemodialysis. No increased urination, blurry vision, nausea, chest pain.  Reviewed HbA1c levels: 02/24/2021: HbA1c 6.8% Lab Results  Component Value Date   HGBA1C 6.3 (H) 02/02/2021   HGBA1C 8.5 (A) 07/30/2020   HGBA1C 7.9 (H) 01/08/2020   HGBA1C 8.7 (A) 09/11/2019   HGBA1C 10.9 (H) 04/19/2019   HGBA1C 6.9 (H) 11/09/2018   HGBA1C 7.1 (H) 07/11/2018   HGBA1C 8.5 (H) 03/27/2018   HGBA1C 8.2 (H) 02/08/2013  11/18/2020: HbA1c 8.9% 05/26/2020: HbA1c 9.8%  Pt is on a regimen of: - Ozempic 1 mg weekly -restarted 11/2020; will start 2 mg weekly soon - U500 insulin (started 2020): - 180-195 >> 200 units before breakfast  - 140-150 units before dinner if dinner is early, and 110-120 units if dinner is after dialysis >> 160-170 >> actually 140-160 units before dinner She was on Levemir and Humalog.  Pt checks her sugars more than 4 times a day with her CGM-started after last visit:   Previously: - am: 59-455 >> 97-203 >> 126-180, 200s >> 128-237 - 2h after b'fast: n/c - before lunch: 101-245 >> 110-120 >> 125-300 - 2h after lunch: n/c - before dinner:  58, 108, 120-150, 200s >> 90-250 - 2h after dinner: n/c - bedtime: n/c   - nighttime: n/c Lowest sugar was 58 (before dinner) >>  90 >> 52; she has hypoglycemia awareness in the 60s. Highest sugar was 455 >>...300s >> 321.  Glucometer: Contour next  Pt's meals are: - Breakfast: yoghurt and blueberries; egg McMuffin - Lunch: fish, tacos, subs, cake - Dinner: salad, steak, shrimp - occasionally after HD at 10 pm, but more recently she has this during dialysis, around 7 PM - Snacks: fruit, peanuts  -+ End-stage renal disease, last BUN/creatinine:  Lab Results  Component Value Date   BUN 28 (H) 04/01/2021   BUN 23 (H) 12/18/2020   CREATININE 8.30 (H) 04/01/2021   CREATININE 7.83 (H) 12/18/2020   She is on hemodialysis: M, Tu, Th, F (home) - in the evening, for 3-4 hours: 6:30 PM to 10 PM She is on the kidney transplant list at [facility]. Requirements for kidney transplant: <200 lbs, HbA1c <8% (? 7%), BMI <40.  -+ HL; last set of lipids: Lab Results  Component Value Date   CHOL 183 08/25/2020   HDL 39 (L) 08/25/2020   LDLCALC 63 08/25/2020   TRIG 536 (H) 08/25/2020   CHOLHDL 4.7 (H) 08/25/2020  On Crestor 20.  - last eye exam was 02/2021 (Dr. Katy Fitch): + DR. + h/o retinal detachment, + cataracts.  She was getting IO injections, not recently >> now needs Laser Sx. She also sees Dr. Posey Pronto (retina specialist).  - she has numbness and tingling in her feet.  She changed from Lyrica to Neurontin 300 mg daily since last visit.  Pt has FH of  DM in M, F.  She also has a history of NAFLD, OSA (but not on CPAP, since she could not tolerate it-now on nasal pillows), gout.   ROS:  + numbness and tingling in his feet  Past Medical History:  Diagnosis Date   Anemia associated with chronic renal failure    ASCUS of cervix with negative high risk HPV 06/2017   Back pain    Chest pain    CHF (congestive heart failure) ([place])    CKD (chronic kidney disease), stage IV ([place]) no dialysis yet   nephrologist-  dr Jamal Maes-- scheduled next visit 09-08-2017 (lov 07-05-2017)   Constipation    Diabetic retinopathy ([place])     Dialysis patient ([place])    Edema, lower extremity    Elevated transaminase level    Family history of breast cancer    Family history of ovarian cancer    Family history of stomach cancer    Fatty liver    FOLLOWED BY DR Henrene Pastor   GERD (gastroesophageal reflux disease)    Hypertension    Idiopathic gout of multiple sites    09-05-2017 per pt stable   Insulin dependent type 2 diabetes mellitus ([place])    followed by dr Toy Care   Joint pain    Mixed hyperlipidemia    OSA (obstructive sleep apnea)    per study 10-20-2015 mild osa w/ AHI 10.3/hr;  09-05-2017 per pt has not used cpap since 1 yr ago   Palpitations    Peripheral neuropathy    feet   S/P arteriovenous (AV) fistula creation 07-04-2015  w/ revision 02-14-2017   left braniocephilic   Shortness of breath    Trigger thumb, right thumb    Umbilical hernia    Vitamin D deficiency    Past Surgical History:  Procedure Laterality Date   A/V FISTULAGRAM N/A 04/01/2021   Procedure: A/V FISTULAGRAM;  Surgeon: [name], MD;  Location: [place] [facility];  Service: Cardiovascular;  Laterality: N/A;   ABDOMINAL HYSTERECTOMY  07/11/2000   dr gott   TAH/BSO for irregular bleeding and leiomyoma   AV FISTULA PLACEMENT Left 07/04/2015   Procedure: ARTERIOVENOUS (AV) FISTULA CREATION-LEFT;  Surgeon: Mal Misty, MD;  Location: [place];  Service: Vascular;  Laterality: Left;   BREAST EXCISIONAL BIOPSY Right    benign   CARPAL TUNNEL RELEASE Bilateral 1993 and [place]:  09-25-1999   BILATERAL TUBAL LIGATION W/ LAST C/S   DILATION AND CURETTAGE OF UTERUS     EXPLORATORY [place] / [place] ADHESIONS/  BILATERAL SALPINGOOPHORECTOMY  07-20-2011  dr s. Rhodia Albright  [facility]   FISTULA SUPERFICIALIZATION Left 08/28/2015   Procedure: BRACHIOCEPHALIC FISTULA SUPERFICIALIZATION;  Surgeon: Mal Misty, MD;  Location: [place];  Service: Vascular;  Laterality: Left;   PATCH ANGIOPLASTY Left 02/14/2017   Procedure: PATCH ANGIOPLASTY;   Surgeon: Elam Dutch, MD;  Location: [place];  Service: Vascular;  Laterality: Left;   PELVIC LAPAROSCOPY  1994   exploration for ectopic preg.   PERIPHERAL VASCULAR BALLOON ANGIOPLASTY  04/01/2021   Procedure: PERIPHERAL VASCULAR BALLOON ANGIOPLASTY;  Surgeon: [name], MD;  Location: [place] [facility];  Service: Cardiovascular;;   RETINAL DETACHMENT SURGERY Left 2015   REVISON OF ARTERIOVENOUS FISTULA Left 02/14/2017   Procedure: REVISON OF ARTERIOVENOUS FISTULA  LEFT ARM;  Surgeon: Elam Dutch, MD;  Location: [place];  Service: Vascular;  Laterality: Left;   TRIGGER FINGER RELEASE Left 2015  thumb   TRIGGER FINGER RELEASE Right 09/09/2017   Procedure: RELEASE TRIGGER FINGER/A-1 PULLEY RIGHT THUMB;  Surgeon: Dorna Leitz, MD;  Location: [place];  Service: Orthopedics;  Laterality: Right;   Social History   Socioeconomic History   Marital status: Married    Spouse name: Not on file   Number of children: 2   Years of education: Not on file   Highest education level: Not on file  Occupational History   Occupation: medical billing and insurance  Tobacco Use   Smoking status: Never   Smokeless tobacco: Never  Vaping Use   Vaping Use: Never used  Substance and Sexual Activity   Alcohol use: Yes    Alcohol/week: 0.0 standard drinks    Comment: Very rare   Drug use: No   Sexual activity: Yes    Birth control/protection: Surgical    Comment: HYST-1st intercourse 50 yo-Fewer than 5 partners  Other Topics Concern   Not on file  Social History Narrative   Not on file   Social Determinants of Health   Financial Resource Strain: Not on file  Food Insecurity: Not on file  Transportation Needs: Not on file  Physical Activity: Not on file  Stress: Not on file  Social Connections: Not on file  Intimate Partner Violence: Not on file   Current Outpatient Medications on File Prior to Visit  Medication Sig Dispense Refill   allopurinol (ZYLOPRIM)  300 MG tablet TAKE 1 TABLET BY MOUTH EVERY MORNING (Patient taking differently: Take 300 mg by mouth at bedtime.) 90 tablet 1   B Complex-C-Folic Acid (RENAL) 1 MG CAPS Take 1 capsule by mouth at bedtime.     cinacalcet (SENSIPAR) 60 MG tablet Take 60 mg by mouth at bedtime.     Continuous Blood Gluc Receiver (FREESTYLE LIBRE 2 READER) DEVI 1 each by Does not apply route daily. 1 each 0   Continuous Blood Gluc Sensor (FREESTYLE LIBRE 2 SENSOR) MISC 1 each by Does not apply route every 14 (fourteen) days. 6 each 3   Insulin Pen Needle 32G X 4 MM MISC Use 2x a day 200 each 3   insulin regular human CONCENTRATED (HUMULIN R U-500 KWIKPEN) 500 UNIT/ML kwikpen Inject 200 units before breakfast 160-170 units before dinner (Patient taking differently: Inject 140-200 Units into the skin See admin instructions. Inject 200 units subcutaneously before breakfast & inject 140-180 units subcutaneously before dinner) 16 mL 11   lanthanum (FOSRENOL) 1000 MG chewable tablet Chew 1,000 mg by mouth 3 (three) times daily with meals.     lidocaine-prilocaine (EMLA) cream USE ON ARAMS AS NEEDED FOR DIALYSIS  10   Microlet Lancets MISC Use as directed to check blood sugars 4 times per day dx: e11.22 300 each 2   MITIGARE 0.6 MG CAPS Monday, Wednesday, and Friday 90 capsule 0   pregabalin (LYRICA) 100 MG capsule TAKE ONE CAPSULE BY MOUTH EVERY MORNING (Patient taking differently: Take 100 mg by mouth at bedtime.) 90 capsule 0   rosuvastatin (CRESTOR) 20 MG tablet Take 1 tablet (20 mg total) by mouth daily. 90 tablet 3   No current facility-administered medications on file prior to visit.   No Known Allergies Family History  Problem Relation Age of Onset   Diabetes Mother    Hypertension Mother    Thyroid disease Mother    Ovarian cancer Mother        dx 69s   Diabetes Father    Hypertension Father    Cancer  Father        STOMACH   Stomach cancer Father 84   Stroke Maternal Grandmother    Stroke Maternal  Grandfather    Breast cancer Maternal Aunt 51   Cancer Paternal Grandfather        unknown type, mets to brain   Cancer Other        unknown type, father's first cousin   Cancer Paternal Uncle        unknown type, dx >50   Cancer Cousin        unknown type, dx 57s, paternal first cousin   Cancer Other        unknown types, 4 of mother's first cousins   Colon cancer Neg Hx    Rectal cancer Neg Hx    PE: BP 128/86 (BP Location: Right Arm, Patient Position: Sitting, Cuff Size: Normal)   Pulse 91   Ht 5' 2.5" (1.588 m)   Wt 219 lb 6.4 oz (99.5 kg)   SpO2 99%   BMI 39.49 kg/m  Wt Readings from Last 3 Encounters:  04/13/21 219 lb 6.4 oz (99.5 kg)  04/01/21 217 lb (98.4 kg)  03/23/21 217 lb 8 oz (98.7 kg)   Constitutional: overweight, in NAD Eyes: PERRLA, EOMI, no exophthalmos ENT: moist mucous membranes, no thyromegaly, no cervical lymphadenopathy Cardiovascular: RRR, No MRG Respiratory: CTA B Musculoskeletal: no deformities, strength intact in all 4 Skin: moist, warm, no rashes Neurological: no tremor with outstretched hands, DTR normal in all 4  ASSESSMENT: 1. DM2, insulin-dependent, uncontrolled, with complications - ESRD, on HD, awaiting kidney transplant ([facility]) - DR - PN  2. HL  3.  Obesity class III  PLAN:  1. Patient with longstanding, uncontrolled, type 2 diabetes, very insulin resistant, on concentrated insulin (U500) and weekly GLP-1 receptor agonist.  At last visit, sugars were quite high again variable.  Upon questioning, she was out of Ozempic for a month and she just obtained it before the visit but did not start it.  I advised her to start 1 mg weekly.  I also advised her to check with her insurance whether the 2 mg weekly dose was covered, or, if not, if Darcel Bayley would be covered.  We also discussed about how to change her U500 insulin doses depending on the size and consistency of her meals.  She was not checking sugars at that time, but she was planning to  get a CGM, as recommended at the previous visit.  HbA1c was 8.9% prior to the visit.  However, since then, she had a lower HbA1c of 6.3% in 01/2021 and a slightly higher HbA1c, but still at goal, at 6.8% on 04/05/2021. CGM interpretation: -At today's visit, we reviewed her CGM downloads: It appears that 46% of values are in target range (goal >70%), while 23% are higher than 180 (goal <25%), and 31% are lower than 70 (goal <4%).  The calculated average blood sugar is 123. -Reviewing the CGM trends, it appears that her sugars are now much lower than before, with frequent values in the 50s.  She has few hyperglycemic spikes, with the highest being 300s around Thanksgiving.  Sugars are lost overnight.  She is not scanning the device consistently so we do have loss of data, however, reviewing individual daily tracings, it appears that the U500 insulin dose is excessive.  We will reduce both breakfast and dinnertime concentrated insulin doses especially as we are also increasing her Ozempic dose to 2 mg weekly.  I did advise  her to adjust the doses of insulin further if needed and to let me know if she continues to have lows. - I suggested to:  Patient Instructions  Please increase: - Ozempic 2 mg weekly  Decrease U500: - 130-150 units before b'fast - 100-120 units before dinner  Please return in 3-4 months.  - advised to check sugars at different times of the day - 4x a day, rotating check times - advised for yearly eye exams >> she is up-to-date - return to clinic in 3-4 months  2. HL -Reviewed latest lipid panel from 08/2020: LDL at goal, but triglycerides very high, HDL low: Lab Results  Component Value Date   CHOL 183 08/25/2020   HDL 39 (L) 08/25/2020   LDLCALC 63 08/25/2020   TRIG 536 (H) 08/25/2020   CHOLHDL 4.7 (H) 08/25/2020  -Continues on Crestor 20 mg daily without side effects  3.  Obesity class III -She needs a BMI less than 40 to qualify for renal transplant -She is on  weight management in the past but the suggested diet was not daily for her due to hemodialysis -We will continue Ozempic which should also help with weight loss -She gained 2 pounds before last visit and 12 lbs since then  Philemon Kingdom, MD PhD [facility]

## 2021-04-13 NOTE — Telephone Encounter (Signed)
Patient Advocate Encounter  Received notification from [place] that prior authorization for [facility] is required.   PA submitted on 11.28.22 Key [id number] Status is pending   [facility] will continue to follow  Luciano Cutter, CPhT Patient Advocate [facility] Phone: [phone] Fax:  [phone]

## 2021-04-13 NOTE — Telephone Encounter (Signed)
Patient Advocate Encounter  Prior Authorization for Ozempic 2mg  has been approved.    PA# case ID [id number] Effective dates: 03/14/21 through 04/13/22  RX already processed, per test claim.

## 2021-04-15 ENCOUNTER — Other Ambulatory Visit: Payer: Self-pay | Admitting: Nurse Practitioner

## 2021-04-17 ENCOUNTER — Other Ambulatory Visit: Payer: Self-pay | Admitting: Internal Medicine

## 2021-04-20 ENCOUNTER — Other Ambulatory Visit: Payer: Self-pay | Admitting: Internal Medicine

## 2021-04-20 ENCOUNTER — Other Ambulatory Visit (HOSPITAL_COMMUNITY): Payer: Self-pay

## 2021-04-20 ENCOUNTER — Ambulatory Visit (INDEPENDENT_AMBULATORY_CARE_PROVIDER_SITE_OTHER): Payer: Medicare Other | Admitting: Cardiovascular Disease

## 2021-04-20 ENCOUNTER — Other Ambulatory Visit: Payer: Self-pay

## 2021-04-20 ENCOUNTER — Telehealth: Payer: Self-pay | Admitting: Pharmacy Technician

## 2021-04-20 ENCOUNTER — Encounter (HOSPITAL_BASED_OUTPATIENT_CLINIC_OR_DEPARTMENT_OTHER): Payer: Self-pay | Admitting: Cardiovascular Disease

## 2021-04-20 DIAGNOSIS — E1159 Type 2 diabetes mellitus with other circulatory complications: Secondary | ICD-10-CM

## 2021-04-20 DIAGNOSIS — I152 Hypertension secondary to endocrine disorders: Secondary | ICD-10-CM

## 2021-04-20 DIAGNOSIS — E7800 Pure hypercholesterolemia, unspecified: Secondary | ICD-10-CM

## 2021-04-20 DIAGNOSIS — G4733 Obstructive sleep apnea (adult) (pediatric): Secondary | ICD-10-CM

## 2021-04-20 DIAGNOSIS — Z9989 Dependence on other enabling machines and devices: Secondary | ICD-10-CM

## 2021-04-20 MED ORDER — ROSUVASTATIN CALCIUM 20 MG PO TABS: 20.0000 mg | ORAL_TABLET | Freq: Every day | ORAL | 3 refills | Status: DC

## 2021-04-20 MED ORDER — PREGABALIN 75 MG PO CAPS: ORAL_CAPSULE | ORAL | 1 refills | Status: DC

## 2021-04-20 MED ORDER — METOPROLOL TARTRATE 100 MG PO TABS: 100.0000 mg | ORAL_TABLET | Freq: Two times a day (BID) | ORAL | 3 refills | Status: DC

## 2021-04-20 MED ORDER — METOPROLOL SUCCINATE ER 100 MG PO TB24: 100.0000 mg | ORAL_TABLET | Freq: Every day | ORAL | 3 refills | Status: DC
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
## 2021-04-20 NOTE — Assessment & Plan Note (Addendum)
Blood pressure well-controlled with HD.  We will switch metoprolol to succinate given that she is only taking it once a day.

## 2021-04-20 NOTE — Telephone Encounter (Signed)
Patient Advocate Encounter  Received notification from [place] ([employer]) that prior authorization for [facility] 2MG /DOSE is required.   PA NOT NEEDED. DRUG IS COVERED BY CURRENT PLAN. Key [id number] Status is pending   [facility] will continue to follow  Luciano Cutter, CPhT Patient Advocate [facility] Phone: [phone] Fax:  [phone]

## 2021-04-20 NOTE — Patient Instructions (Addendum)
Medication Instructions:  STOP- Metoprolol Tartrate START- Metoprolol Succinate 100 mg by mouth a day  *If you need a refill on your cardiac medications before your next appointment, please call your pharmacy*   Lab Work: None Ordered   Testing/Procedures: None Ordered   Follow-Up: At [facility], you and your health needs are our priority.  As part of our continuing mission to provide you with exceptional heart care, we have created designated Provider Care Teams.  These Care Teams include your primary Cardiologist (physician) and Advanced Practice Providers (APPs -  Physician Assistants and Nurse Practitioners) who all work together to provide you with the care you need, when you need it.  We recommend signing up for the patient portal called "MyChart".  Sign up information is provided on this After Visit Summary.  MyChart is used to connect with patients for Virtual Visits (Telemedicine).  Patients are able to view lab/test results, encounter notes, upcoming appointments, etc.  Non-urgent messages can be sent to your provider as well.   To learn more about what you can do with MyChart, go to [email or web address].    Your next appointment:   3 month(s)  The format for your next appointment:   In Person  Provider:   Skeet Latch, MD

## 2021-04-21 ENCOUNTER — Telehealth (HOSPITAL_BASED_OUTPATIENT_CLINIC_OR_DEPARTMENT_OTHER): Payer: Self-pay | Admitting: Cardiovascular Disease

## 2021-04-21 NOTE — Telephone Encounter (Signed)
Called to schedule 3 month follow up with Dr. Oval Linsey per 04/20/21 AVS---no answer and mail box is full

## 2021-05-16 ENCOUNTER — Other Ambulatory Visit: Payer: Self-pay | Admitting: Internal Medicine

## 2021-05-19 ENCOUNTER — Encounter: Payer: Self-pay | Admitting: Internal Medicine

## 2021-05-19 ENCOUNTER — Other Ambulatory Visit: Payer: Self-pay | Admitting: Internal Medicine

## 2021-05-19 MED ORDER — PREGABALIN 150 MG PO CAPS: 150.0000 mg | ORAL_CAPSULE | Freq: Two times a day (BID) | ORAL | 1 refills | Status: DC

## 2021-05-19 NOTE — Telephone Encounter (Signed)
Patient called in reference to this request regarding Ozempic 1 MG RX to be sent pending the PA for the 2 MG being approved  Pharmacy to be sent to [facility].    Also is requesting an update on the PA for Ozempic 2MG   Call back # [phone]

## 2021-05-20 ENCOUNTER — Other Ambulatory Visit (HOSPITAL_COMMUNITY): Payer: Self-pay

## 2021-05-21 ENCOUNTER — Telehealth: Payer: Self-pay | Admitting: Pharmacy Technician

## 2021-05-21 ENCOUNTER — Encounter (HOSPITAL_BASED_OUTPATIENT_CLINIC_OR_DEPARTMENT_OTHER): Payer: Self-pay | Admitting: Cardiovascular Disease

## 2021-05-21 ENCOUNTER — Other Ambulatory Visit (HOSPITAL_COMMUNITY): Payer: Self-pay

## 2021-05-21 DIAGNOSIS — E7800 Pure hypercholesterolemia, unspecified: Secondary | ICD-10-CM | POA: Insufficient documentation

## 2021-05-21 HISTORY — DX: Pure hypercholesterolemia, unspecified: E78.00

## 2021-05-21 NOTE — Assessment & Plan Note (Signed)
She has been out of rosuvastatin.  We will reorder.

## 2021-05-21 NOTE — Assessment & Plan Note (Signed)
She is on Ozempic.  Continue to encourage with 150 minutes of exercise weekly.

## 2021-05-21 NOTE — Telephone Encounter (Signed)
Pt advised via MyChart message regarding determination.

## 2021-05-21 NOTE — Telephone Encounter (Signed)
Patient Advocate Encounter   Received notification from pt/office that prior authorization for Ozempic 2mg  is required by his/her insurance. Had a PA already on file from her [facility], but now the pt has a Art therapist as well.    PA submitted on 05/21/21  Status is Approved  PA# Case ID [id number] Effective dates: 05/21/2021 through 05/21/2022  Per Test Claim Patients co-pay is $10.35.   Spoke with Pharmacy to Process.  Patient Advocate Fax:  [phone]

## 2021-05-21 NOTE — Assessment & Plan Note (Signed)
Continue CPAP.  

## 2021-05-27 ENCOUNTER — Other Ambulatory Visit: Payer: Self-pay | Admitting: Internal Medicine

## 2021-05-27 DIAGNOSIS — R413 Other amnesia: Secondary | ICD-10-CM

## 2021-05-27 DIAGNOSIS — G4733 Obstructive sleep apnea (adult) (pediatric): Secondary | ICD-10-CM

## 2021-06-04 ENCOUNTER — Other Ambulatory Visit: Payer: Self-pay

## 2021-06-04 ENCOUNTER — Encounter: Payer: Self-pay | Admitting: Internal Medicine

## 2021-06-04 ENCOUNTER — Telehealth: Payer: Self-pay | Admitting: *Deleted

## 2021-06-04 ENCOUNTER — Ambulatory Visit (INDEPENDENT_AMBULATORY_CARE_PROVIDER_SITE_OTHER): Payer: Medicare Other | Admitting: Nurse Practitioner

## 2021-06-04 VITALS — BP 132/82

## 2021-06-04 DIAGNOSIS — E1122 Type 2 diabetes mellitus with diabetic chronic kidney disease: Secondary | ICD-10-CM

## 2021-06-04 DIAGNOSIS — N951 Menopausal and female climacteric states: Secondary | ICD-10-CM | POA: Diagnosis not present

## 2021-06-04 DIAGNOSIS — Z992 Dependence on renal dialysis: Secondary | ICD-10-CM

## 2021-06-04 MED ORDER — INSULIN PEN NEEDLE 32G X 4 MM MISC: 3 refills | Status: AC

## 2021-06-04 MED ORDER — ESTRADIOL 0.5 MG PO TABS: 0.5000 mg | ORAL_TABLET | Freq: Every day | ORAL | 1 refills | Status: DC

## 2021-06-04 NOTE — Progress Notes (Signed)
° °  Acute Office Visit  Subjective:    Patient ID: [name], female    DOB: Apr 02, 1971, 51 y.o.   MRN: [id number]   HPI 51 y.o. presents today to discuss hot flashes. Was on HRT in the past, stopped about 5 years ago due to longevity of use. She has had occasional hot flashes since discontinuing but they have increased in frequency over the last 3-4 weeks and occur multiple times per day. She also complains of insomnia. S/P TAH with subsequent BSO. CKD IV, on metoprolol for elevated heart rate. Denies history of HTN. Normal TSH in August. Denies any new medications since symptoms began.   Review of Systems  Constitutional: Negative.   Endocrine: Positive for heat intolerance.  Genitourinary: Negative.   Psychiatric/Behavioral:  Positive for sleep disturbance.       Objective:    Physical Exam Constitutional:      Appearance: Normal appearance.  GU: Not indicated  BP 132/82  Wt Readings from Last 3 Encounters:  04/20/21 220 lb 6.4 oz (100 kg)  04/13/21 219 lb 6.4 oz (99.5 kg)  04/01/21 217 lb (98.4 kg)        Assessment & Plan:   Problem List Items Addressed This Visit   None Visit Diagnoses     Hot flashes due to menopause    -  Primary   Relevant Medications   estradiol (ESTRACE) 0.5 MG tablet   Insomnia associated with menopause          Plan: Discussed restarting low dose ERT and risk of use to include blood clots, heart attack, stroke, and breast cancer with goals for short term use. She is aware and wants to restart. Estradiol 0.5 mg daily.She plans to discuss with nephrology prior to starting.  She has annual in April and we will reassess then or sooner if needed.      Tamela Gammon DNP, 9:07 AM 06/04/2021

## 2021-06-04 NOTE — Telephone Encounter (Signed)
She had hysterectomy so progesterone is not required as this is used for endometrial protection while taking estrogen.

## 2021-06-04 NOTE — Telephone Encounter (Signed)
Patient was seen today and prescribed estradiol 0.5 mg tablet. Patient said she spoke with her nephrologist and he was concerned that patient is not taking progesterone with estradiol. Patient said she wanted to clarify if she should be taking progesterone due to the risk of breast cancer. Please advise

## 2021-06-04 NOTE — Telephone Encounter (Signed)
Progesterone does not protect against breast cancer.  I should have clarified that in the last message. Remind her that she was on Estradiol for years without progesterone as well.

## 2021-06-05 NOTE — Telephone Encounter (Signed)
Patient informed with both messages.

## 2021-06-24 ENCOUNTER — Telehealth: Payer: Self-pay | Admitting: Internal Medicine

## 2021-07-13 ENCOUNTER — Encounter: Payer: Self-pay | Admitting: Nurse Practitioner

## 2021-07-13 ENCOUNTER — Telehealth (INDEPENDENT_AMBULATORY_CARE_PROVIDER_SITE_OTHER): Payer: Medicare Other | Admitting: Nurse Practitioner

## 2021-07-13 ENCOUNTER — Other Ambulatory Visit: Payer: Self-pay

## 2021-07-13 VITALS — Temp 100.2°F

## 2021-07-13 DIAGNOSIS — R051 Acute cough: Secondary | ICD-10-CM | POA: Diagnosis not present

## 2021-07-13 MED ORDER — ALBUTEROL SULFATE HFA 108 (90 BASE) MCG/ACT IN AERS: 2.0000 | INHALATION_SPRAY | Freq: Four times a day (QID) | RESPIRATORY_TRACT | 2 refills | Status: AC | PRN

## 2021-07-13 MED ORDER — GUAIFENESIN 200 MG PO TABS: 200.0000 mg | ORAL_TABLET | ORAL | 0 refills | Status: DC | PRN

## 2021-07-13 MED ORDER — AZITHROMYCIN 250 MG PO TABS: ORAL_TABLET | ORAL | 0 refills | Status: AC

## 2021-07-13 NOTE — Progress Notes (Signed)
Virtual Visit via MyChart   This visit type was conducted due to national recommendations for restrictions regarding the COVID-19 Pandemic (e.g. social distancing) in an effort to limit this patient's exposure and mitigate transmission in our community.  Due to her co-morbid illnesses, this patient is at least at moderate risk for complications without adequate follow up.  This format is felt to be most appropriate for this patient at this time.  All issues noted in this document were discussed and addressed.  A limited physical exam was performed with this format.    This visit type was conducted due to national recommendations for restrictions regarding the COVID-19 Pandemic (e.g. social distancing) in an effort to limit this patient's exposure and mitigate transmission in our community.  Patients identity confirmed using two different identifiers.  This format is felt to be most appropriate for this patient at this time.  All issues noted in this document were discussed and addressed.  No physical exam was performed (except for noted visual exam findings with Video Visits).    Date:  07/13/2021   ID:  [place], DOB 1971-02-28, MRN [id number]  Patient Location:  Home - spoke with Westly Pam  Provider location:   Office    Chief Complaint:  cough/fever  History of Present Illness:    Cassandra Campos is a 51 y.o. female who presents via video conferencing for a telehealth visit today.    The patient does have symptoms concerning for COVID-19 infection (fever, chills, cough, or new shortness of breath).   Patient presents today for treatment of cough. She states that her symptoms started on last Tuesday. Her cough started on 2/21 and returned from  [place] on 2/22 for vacation. She states that she wore her mask most of the trip but she did misplace it. When she has to do her treatment she has to take her temperature and had a fever. She has not taken any  Tylenol. Unable to get her secretions out. She has not taken any medications at all. Reports her stomach muscles are sore.  Cough This is a new problem. The current episode started in the past 7 days. Associated symptoms include a fever. Pertinent negatives include no headaches.    Past Medical History:  Diagnosis Date   Anemia associated with chronic renal failure    ASCUS of cervix with negative high risk HPV 06/2017   Back pain    Chest pain    CHF (congestive heart failure) ([place])    CKD (chronic kidney disease), stage IV ([place]) no dialysis yet   nephrologist-  dr Jamal Maes-- scheduled next visit 09-08-2017 (lov 07-05-2017)   Constipation    Diabetic retinopathy ([place])    Dialysis patient ([place])    Edema, lower extremity    Elevated transaminase level    Family history of breast cancer    Family history of ovarian cancer    Family history of stomach cancer    Fatty liver    FOLLOWED BY DR Henrene Pastor   GERD (gastroesophageal reflux disease)    Hypertension    Idiopathic gout of multiple sites    09-05-2017 per pt stable   Insulin dependent type 2 diabetes mellitus ([place])    followed by dr Toy Care   Joint pain    Mixed hyperlipidemia    OSA (obstructive sleep apnea)    per study 10-20-2015 mild osa w/ AHI 10.3/hr;  09-05-2017 per pt has not used cpap since 1 yr ago  Palpitations    Peripheral neuropathy    feet   Pure hypercholesterolemia 05/21/2021   S/P arteriovenous (AV) fistula creation 07-04-2015  w/ revision 02-14-2017   left braniocephilic   Shortness of breath    Trigger thumb, right thumb    Umbilical hernia    Vitamin D deficiency    Past Surgical History:  Procedure Laterality Date   A/V FISTULAGRAM N/A 04/01/2021   Procedure: A/V FISTULAGRAM;  Surgeon: [name], MD;  Location: [facility];  Service: Cardiovascular;  Laterality: N/A;   ABDOMINAL HYSTERECTOMY  07/11/2000   dr gott   TAH/BSO for irregular bleeding and leiomyoma   AV FISTULA  PLACEMENT Left 07/04/2015   Procedure: ARTERIOVENOUS (AV) FISTULA CREATION-LEFT;  Surgeon: Mal Misty, MD;  Location: [facility];  Service: Vascular;  Laterality: Left;   BREAST EXCISIONAL BIOPSY Right    benign   CARPAL TUNNEL RELEASE Bilateral 1993 and [place]:  09-25-1999   BILATERAL TUBAL LIGATION W/ LAST C/S   DILATION AND CURETTAGE OF UTERUS     EXPLORATORY [place] / [place] ADHESIONS/  BILATERAL SALPINGOOPHORECTOMY  07-20-2011  dr s. Rhodia Albright  [facility]   FISTULA SUPERFICIALIZATION Left 08/28/2015   Procedure: BRACHIOCEPHALIC FISTULA SUPERFICIALIZATION;  Surgeon: Mal Misty, MD;  Location: [facility];  Service: Vascular;  Laterality: Left;   PATCH ANGIOPLASTY Left 02/14/2017   Procedure: PATCH ANGIOPLASTY;  Surgeon: Elam Dutch, MD;  Location: [facility];  Service: Vascular;  Laterality: Left;   PELVIC LAPAROSCOPY  1994   exploration for ectopic preg.   PERIPHERAL VASCULAR BALLOON ANGIOPLASTY  04/01/2021   Procedure: PERIPHERAL VASCULAR BALLOON ANGIOPLASTY;  Surgeon: [name], MD;  Location: [facility];  Service: Cardiovascular;;   RETINAL DETACHMENT SURGERY Left 2015   REVISON OF ARTERIOVENOUS FISTULA Left 02/14/2017   Procedure: REVISON OF ARTERIOVENOUS FISTULA  LEFT ARM;  Surgeon: Elam Dutch, MD;  Location: [facility];  Service: Vascular;  Laterality: Left;   TRIGGER FINGER RELEASE Left 2015   thumb   TRIGGER FINGER RELEASE Right 09/09/2017   Procedure: RELEASE TRIGGER FINGER/A-1 PULLEY RIGHT THUMB;  Surgeon: Dorna Leitz, MD;  Location: [facility];  Service: Orthopedics;  Laterality: Right;     Current Meds  Medication Sig   albuterol (VENTOLIN HFA) 108 (90 Base) MCG/ACT inhaler Inhale 2 puffs into the lungs every 6 (six) hours as needed for wheezing or shortness of breath.   azithromycin (ZITHROMAX) 250 MG tablet Take 2 tablets (500 mg) on  Day 1,  followed by 1 tablet (250 mg) once daily on Days 2 through 5.   guaiFENesin 200 MG  tablet Take 1 tablet (200 mg total) by mouth every 4 (four) hours as needed for cough or to loosen phlegm.     Allergies:   Patient has no known allergies.   Social History   Tobacco Use   Smoking status: Never   Smokeless tobacco: Never  Vaping Use   Vaping Use: Never used  Substance Use Topics   Alcohol use: Yes    Alcohol/week: 0.0 standard drinks    Comment: Very rare   Drug use: No     Family Hx: The patient's family history includes Breast cancer (age of onset: 30) in her maternal aunt; Cancer in her cousin, father, paternal grandfather, paternal uncle, and other family members; Diabetes in her father and mother; Hypertension in her father and mother; Ovarian cancer in her mother; Stomach cancer (age of  onset: 62) in her father; Stroke in her maternal grandfather and maternal grandmother; Thyroid disease in her mother. There is no history of Colon cancer or Rectal cancer.  ROS:   Please see the history of present illness.    Review of Systems  Constitutional:  Positive for fever.  Respiratory:  Positive for cough.   Neurological:  Negative for headaches.   All other systems reviewed and are negative.   Labs/Other Tests and Data Reviewed:    Recent Labs: 12/18/2020: ALT 43; Platelets 185; TSH 1.010 04/01/2021: BUN 28; Creatinine, Ser 8.30; Hemoglobin 11.2; Potassium 3.7; Sodium 137   Recent Lipid Panel Lab Results  Component Value Date/Time   CHOL 183 08/25/2020 03:22 PM   TRIG 536 (H) 08/25/2020 03:22 PM   HDL 39 (L) 08/25/2020 03:22 PM   CHOLHDL 4.7 (H) 08/25/2020 03:22 PM   LDLCALC 63 08/25/2020 03:22 PM    Wt Readings from Last 3 Encounters:  04/20/21 220 lb 6.4 oz (100 kg)  04/13/21 219 lb 6.4 oz (99.5 kg)  04/01/21 217 lb (98.4 kg)     Exam:    Vital Signs:  Temp 100.2 F (37.9 C) (Oral)     Physical Exam Constitutional:      General: She is not in acute distress.    Appearance: Normal appearance. She is obese.  Pulmonary:     Effort: Pulmonary  effort is normal. No respiratory distress.     Comments: She does have a dry cough during virtual visit Neurological:     General: No focal deficit present.     Mental Status: She is alert and oriented to person, place, and time. Mental status is at baseline.     Cranial Nerves: No cranial nerve deficit.     Motor: No weakness.  Psychiatric:        Mood and Affect: Mood and affect normal.        Behavior: Behavior normal.        Thought Content: Thought content normal.        Cognition and Memory: Memory normal.        Judgment: Judgment normal.    ASSESSMENT & PLAN:   1. Acute cough Negative rapid covid, Flu A/B however she began symptoms approx 6 days ago, may be develeoping into URI due to worsening cough. Azithromycin sent. PCR also sent. Encouraged to stay well hydrated with water and if symptoms worsen go to ER - POC Influenza A&B (Binax test) - POC COVID-19 - Novel Coronavirus, NAA ([facility]) - albuterol (VENTOLIN HFA) 108 (90 Base) MCG/ACT inhaler; Inhale 2 puffs into the lungs every 6 (six) hours as needed for wheezing or shortness of breath.  Dispense: 8 g; Refill: 2 - guaiFENesin 200 MG tablet; Take 1 tablet (200 mg total) by mouth every 4 (four) hours as needed for cough or to loosen phlegm.  Dispense: 30 suppository; Refill: 0 - azithromycin (ZITHROMAX) 250 MG tablet; Take 2 tablets (500 mg) on  Day 1,  followed by 1 tablet (250 mg) once daily on Days 2 through 5.  Dispense: 6 each; Refill: 0    COVID-19 Education: The signs and symptoms of COVID-19 were discussed with the patient and how to seek care for testing (follow up with PCP or arrange E-visit).  The importance of social distancing was discussed today.  Patient Risk:   After full review of this patients clinical status, I feel that they are at least moderate risk at this time.  Time:   Today,  I have spent 10.30 minutes/ seconds with the patient with telehealth technology discussing above diagnoses.      Medication Adjustments/Labs and Tests Ordered: Current medicines are reviewed at length with the patient today.  Concerns regarding medicines are outlined above.   Tests Ordered: Orders Placed This Encounter  Procedures   Novel Coronavirus, NAA ([facility])   POC Influenza A&B (Binax test)   POC COVID-19    Medication Changes: Meds ordered this encounter  Medications   albuterol (VENTOLIN HFA) 108 (90 Base) MCG/ACT inhaler    Sig: Inhale 2 puffs into the lungs every 6 (six) hours as needed for wheezing or shortness of breath.    Dispense:  8 g    Refill:  2   guaiFENesin 200 MG tablet    Sig: Take 1 tablet (200 mg total) by mouth every 4 (four) hours as needed for cough or to loosen phlegm.    Dispense:  30 suppository    Refill:  0   azithromycin (ZITHROMAX) 250 MG tablet    Sig: Take 2 tablets (500 mg) on  Day 1,  followed by 1 tablet (250 mg) once daily on Days 2 through 5.    Dispense:  6 each    Refill:  0    Disposition:  Follow up prn  Signed, Minette Brine, FNP

## 2021-07-13 NOTE — Patient Instructions (Signed)

## 2021-07-14 LAB — NOVEL CORONAVIRUS, NAA: SARS-CoV-2, NAA: NOT DETECTED

## 2021-07-20 NOTE — Telephone Encounter (Signed)
error 

## 2021-07-23 ENCOUNTER — Emergency Department (HOSPITAL_BASED_OUTPATIENT_CLINIC_OR_DEPARTMENT_OTHER): Payer: Medicare Other

## 2021-07-23 ENCOUNTER — Other Ambulatory Visit: Payer: Self-pay

## 2021-07-23 ENCOUNTER — Emergency Department (HOSPITAL_COMMUNITY): Payer: Medicare Other

## 2021-07-23 ENCOUNTER — Inpatient Hospital Stay (HOSPITAL_COMMUNITY)
Admission: EM | Admit: 2021-07-23 | Discharge: 2021-07-25 | DRG: 252 | Disposition: A | Discharge status: Home / Self Care | Payer: Medicare Other | Attending: Internal Medicine | Admitting: Internal Medicine

## 2021-07-23 ENCOUNTER — Encounter (HOSPITAL_COMMUNITY): Payer: Self-pay | Admitting: Emergency Medicine

## 2021-07-23 DIAGNOSIS — Y832 Surgical operation with anastomosis, bypass or graft as the cause of abnormal reaction of the patient, or of later complication, without mention of misadventure at the time of the procedure: Secondary | ICD-10-CM | POA: Diagnosis present

## 2021-07-23 DIAGNOSIS — T827XXA Infection and inflammatory reaction due to other cardiac and vascular devices, implants and grafts, initial encounter: Principal | ICD-10-CM

## 2021-07-23 DIAGNOSIS — E785 Hyperlipidemia, unspecified: Secondary | ICD-10-CM

## 2021-07-23 DIAGNOSIS — Z9079 Acquired absence of other genital organ(s): Secondary | ICD-10-CM | POA: Diagnosis not present

## 2021-07-23 DIAGNOSIS — E11319 Type 2 diabetes mellitus with unspecified diabetic retinopathy without macular edema: Secondary | ICD-10-CM | POA: Diagnosis present

## 2021-07-23 DIAGNOSIS — Z992 Dependence on renal dialysis: Secondary | ICD-10-CM

## 2021-07-23 DIAGNOSIS — E1122 Type 2 diabetes mellitus with diabetic chronic kidney disease: Secondary | ICD-10-CM | POA: Diagnosis present

## 2021-07-23 DIAGNOSIS — I959 Hypotension, unspecified: Secondary | ICD-10-CM | POA: Diagnosis present

## 2021-07-23 DIAGNOSIS — Z794 Long term (current) use of insulin: Secondary | ICD-10-CM | POA: Diagnosis not present

## 2021-07-23 DIAGNOSIS — K219 Gastro-esophageal reflux disease without esophagitis: Secondary | ICD-10-CM | POA: Diagnosis present

## 2021-07-23 DIAGNOSIS — I132 Hypertensive heart and chronic kidney disease with heart failure and with stage 5 chronic kidney disease, or end stage renal disease: Secondary | ICD-10-CM | POA: Diagnosis present

## 2021-07-23 DIAGNOSIS — N186 End stage renal disease: Secondary | ICD-10-CM | POA: Diagnosis present

## 2021-07-23 DIAGNOSIS — Z79890 Hormone replacement therapy (postmenopausal): Secondary | ICD-10-CM | POA: Diagnosis not present

## 2021-07-23 DIAGNOSIS — K760 Fatty (change of) liver, not elsewhere classified: Secondary | ICD-10-CM | POA: Diagnosis present

## 2021-07-23 DIAGNOSIS — D631 Anemia in chronic kidney disease: Secondary | ICD-10-CM | POA: Diagnosis present

## 2021-07-23 DIAGNOSIS — G4733 Obstructive sleep apnea (adult) (pediatric): Secondary | ICD-10-CM | POA: Diagnosis present

## 2021-07-23 DIAGNOSIS — M79602 Pain in left arm: Secondary | ICD-10-CM

## 2021-07-23 DIAGNOSIS — Z7985 Long-term (current) use of injectable non-insulin antidiabetic drugs: Secondary | ICD-10-CM | POA: Diagnosis not present

## 2021-07-23 DIAGNOSIS — I509 Heart failure, unspecified: Secondary | ICD-10-CM | POA: Diagnosis not present

## 2021-07-23 DIAGNOSIS — E669 Obesity, unspecified: Secondary | ICD-10-CM | POA: Diagnosis present

## 2021-07-23 DIAGNOSIS — N185 Chronic kidney disease, stage 5: Secondary | ICD-10-CM

## 2021-07-23 DIAGNOSIS — Z79899 Other long term (current) drug therapy: Secondary | ICD-10-CM

## 2021-07-23 DIAGNOSIS — T82898A Other specified complication of vascular prosthetic devices, implants and grafts, initial encounter: Secondary | ICD-10-CM | POA: Diagnosis not present

## 2021-07-23 DIAGNOSIS — Z90710 Acquired absence of both cervix and uterus: Secondary | ICD-10-CM

## 2021-07-23 DIAGNOSIS — E1142 Type 2 diabetes mellitus with diabetic polyneuropathy: Secondary | ICD-10-CM | POA: Diagnosis present

## 2021-07-23 DIAGNOSIS — Z90722 Acquired absence of ovaries, bilateral: Secondary | ICD-10-CM

## 2021-07-23 DIAGNOSIS — E782 Mixed hyperlipidemia: Secondary | ICD-10-CM | POA: Diagnosis present

## 2021-07-23 DIAGNOSIS — Z20822 Contact with and (suspected) exposure to covid-19: Secondary | ICD-10-CM | POA: Diagnosis present

## 2021-07-23 DIAGNOSIS — L0390 Cellulitis, unspecified: Secondary | ICD-10-CM | POA: Diagnosis present

## 2021-07-23 DIAGNOSIS — Z6838 Body mass index (BMI) 38.0-38.9, adult: Secondary | ICD-10-CM

## 2021-07-23 DIAGNOSIS — M1000 Idiopathic gout, unspecified site: Secondary | ICD-10-CM | POA: Diagnosis present

## 2021-07-23 DIAGNOSIS — M898X9 Other specified disorders of bone, unspecified site: Secondary | ICD-10-CM | POA: Diagnosis present

## 2021-07-23 DIAGNOSIS — Z8249 Family history of ischemic heart disease and other diseases of the circulatory system: Secondary | ICD-10-CM

## 2021-07-23 DIAGNOSIS — Z833 Family history of diabetes mellitus: Secondary | ICD-10-CM

## 2021-07-23 LAB — CBC WITH DIFFERENTIAL/PLATELET
Abs Immature Granulocytes: 0.02 10*3/uL (ref 0.00–0.07)
Basophils Absolute: 0 10*3/uL (ref 0.0–0.1)
Basophils Relative: 1 %
Eosinophils Absolute: 0 10*3/uL (ref 0.0–0.5)
Eosinophils Relative: 0 %
HCT: 27.3 % — ABNORMAL LOW (ref 36.0–46.0)
Hemoglobin: 8.9 g/dL — ABNORMAL LOW (ref 12.0–15.0)
Immature Granulocytes: 0 %
Lymphocytes Relative: 30 %
Lymphs Abs: 1.9 10*3/uL (ref 0.7–4.0)
MCH: 31.9 pg (ref 26.0–34.0)
MCHC: 32.6 g/dL (ref 30.0–36.0)
MCV: 97.8 fL (ref 80.0–100.0)
Monocytes Absolute: 0.5 10*3/uL (ref 0.1–1.0)
Monocytes Relative: 7 %
Neutro Abs: 3.9 10*3/uL (ref 1.7–7.7)
Neutrophils Relative %: 62 %
Platelets: 230 10*3/uL (ref 150–400)
RBC: 2.79 MIL/uL — ABNORMAL LOW (ref 3.87–5.11)
RDW: 12.9 % (ref 11.5–15.5)
WBC: 6.4 10*3/uL (ref 4.0–10.5)
nRBC: 0 % (ref 0.0–0.2)

## 2021-07-23 LAB — BASIC METABOLIC PANEL
Anion gap: 14 (ref 5–15)
BUN: 35 mg/dL — ABNORMAL HIGH (ref 6–20)
CO2: 30 mmol/L (ref 22–32)
Calcium: 9.1 mg/dL (ref 8.9–10.3)
Chloride: 93 mmol/L — ABNORMAL LOW (ref 98–111)
Creatinine, Ser: 10.22 mg/dL — ABNORMAL HIGH (ref 0.44–1.00)
GFR, Estimated: 4 mL/min — ABNORMAL LOW (ref >60)
Glucose, Bld: 200 mg/dL — ABNORMAL HIGH (ref 70–99)
Potassium: 3.5 mmol/L (ref 3.5–5.1)
Sodium: 137 mmol/L (ref 135–145)

## 2021-07-23 LAB — RESP PANEL BY RT-PCR (FLU A&B, COVID) ARPGX2
Influenza A by PCR: NEGATIVE
Influenza B by PCR: NEGATIVE
SARS Coronavirus 2 by RT PCR: NEGATIVE

## 2021-07-23 MED ORDER — INSULIN REGULAR HUMAN (CONC) 500 UNIT/ML ~~LOC~~ SOPN
150.0000 [IU] | PEN_INJECTOR | Freq: Every day | SUBCUTANEOUS | Status: DC
  Filled 2021-07-23: qty 3

## 2021-07-23 MED ORDER — INSULIN REGULAR HUMAN (CONC) 500 UNIT/ML ~~LOC~~ SOPN
120.0000 [IU] | PEN_INJECTOR | Freq: Every day | SUBCUTANEOUS | Status: DC
  Filled 2021-07-23: qty 3

## 2021-07-23 MED ORDER — SODIUM CHLORIDE 0.9 % IV SOLN
2.0000 g | INTRAVENOUS | Status: DC
  Administered 2021-07-23 – 2021-07-24 (×2): 2 g via INTRAVENOUS
  Filled 2021-07-23 (×2): qty 20

## 2021-07-23 MED ORDER — VANCOMYCIN HCL 2000 MG/400ML IV SOLN
2000.0000 mg | Freq: Once | INTRAVENOUS | Status: AC
  Administered 2021-07-23: 22:00:00 2000 mg via INTRAVENOUS
  Filled 2021-07-23: qty 400

## 2021-07-23 NOTE — ED Notes (Signed)
Blood culture collection at 1946 was charted on the wrong pt ?

## 2021-07-23 NOTE — Consult Note (Signed)
[facility] [place]  ASSESSMENT / PLAN: 51 y.o. female with ESRD dialyzing at home TTS via left brachiocepahlic arteriovenous fistula created in 2017. She has a superficial infection of her skin with ulceration. She would benefit from revision of her fistula to reduce risk of catastrophic bleeding. Recommend admission to internal medicine service. NPO after midnight. Plan revision tomorrow in operating room.   CHIEF COMPLAINT: Ulcerated AV fistula  HISTORY OF PRESENT ILLNESS: Cassandra Campos is a 51 y.o. female who presents to [facility], ER for evaluation of a left upper extremity ulcerated AV fistula.  The patient noticed scabbing and purulent drainage from her left arm fistula over the past several days.  She presented to her dialysis center for evaluation today.  They did a wound culture and instructed her to present to [facility], ER.  She usually does dialysis Tuesday Thursday and Saturday at home.  She reports chills.  She has no other constitutional symptoms of infection.  VASCULAR SURGICAL HISTORY:  Left brachiocephalic arteriovenous fistula creation 07/04/15 Superficialization of AVF 08/28/15 Revision of AVF 02/14/17 Fistulagram with angioplasty of fistula 04/01/21  Past Medical History:  Diagnosis Date   Anemia associated with chronic renal failure    ASCUS of cervix with negative high risk HPV 06/2017   Back pain    Chest pain    CHF (congestive heart failure) ([place])    CKD (chronic kidney disease), stage IV ([place]) no dialysis yet   nephrologist-  dr Jamal Maes-- scheduled next visit 09-08-2017 (lov 07-05-2017)   Constipation    Diabetic retinopathy ([place])    Dialysis patient ([place])    Edema, lower extremity    Elevated transaminase level    Family history of breast cancer    Family history of ovarian cancer    Family history of stomach cancer    Fatty liver    FOLLOWED BY DR Henrene Pastor   GERD (gastroesophageal reflux disease)    Hypertension     Idiopathic gout of multiple sites    09-05-2017 per pt stable   Insulin dependent type 2 diabetes mellitus ([place])    followed by dr Toy Care   Joint pain    Mixed hyperlipidemia    OSA (obstructive sleep apnea)    per study 10-20-2015 mild osa w/ AHI 10.3/hr;  09-05-2017 per pt has not used cpap since 1 yr ago   Palpitations    Peripheral neuropathy    feet   Pure hypercholesterolemia 05/21/2021   S/P arteriovenous (AV) fistula creation 07-04-2015  w/ revision 02-14-2017   left braniocephilic   Shortness of breath    Trigger thumb, right thumb    Umbilical hernia    Vitamin D deficiency     Past Surgical History:  Procedure Laterality Date   A/V FISTULAGRAM N/A 04/01/2021   Procedure: A/V FISTULAGRAM;  Surgeon: [name], MD;  Location: [facility];  Service: Cardiovascular;  Laterality: N/A;   ABDOMINAL HYSTERECTOMY  07/11/2000   dr gott   TAH/BSO for irregular bleeding and leiomyoma   AV FISTULA PLACEMENT Left 07/04/2015   Procedure: ARTERIOVENOUS (AV) FISTULA CREATION-LEFT;  Surgeon: Mal Misty, MD;  Location: [facility];  Service: Vascular;  Laterality: Left;   BREAST EXCISIONAL BIOPSY Right    benign   CARPAL TUNNEL RELEASE Bilateral 1993 and [place]:  09-25-1999   BILATERAL TUBAL LIGATION W/ LAST C/S   DILATION AND CURETTAGE OF UTERUS     EXPLORATORY LAPARTOMY /  [place] ADHESIONS/  BILATERAL SALPINGOOPHORECTOMY  07-20-2011  dr s. Rhodia Albright  [facility]   FISTULA SUPERFICIALIZATION Left 08/28/2015   Procedure: BRACHIOCEPHALIC FISTULA SUPERFICIALIZATION;  Surgeon: Mal Misty, MD;  Location: [facility];  Service: Vascular;  Laterality: Left;   PATCH ANGIOPLASTY Left 02/14/2017   Procedure: PATCH ANGIOPLASTY;  Surgeon: Elam Dutch, MD;  Location: [facility];  Service: Vascular;  Laterality: Left;   PELVIC LAPAROSCOPY  1994   exploration for ectopic preg.   PERIPHERAL VASCULAR BALLOON ANGIOPLASTY  04/01/2021   Procedure: PERIPHERAL VASCULAR BALLOON  ANGIOPLASTY;  Surgeon: [name], MD;  Location: [facility];  Service: Cardiovascular;;   RETINAL DETACHMENT SURGERY Left 2015   REVISON OF ARTERIOVENOUS FISTULA Left 02/14/2017   Procedure: REVISON OF ARTERIOVENOUS FISTULA  LEFT ARM;  Surgeon: Elam Dutch, MD;  Location: [facility];  Service: Vascular;  Laterality: Left;   TRIGGER FINGER RELEASE Left 2015   thumb   TRIGGER FINGER RELEASE Right 09/09/2017   Procedure: RELEASE TRIGGER FINGER/A-1 PULLEY RIGHT THUMB;  Surgeon: Dorna Leitz, MD;  Location: [facility];  Service: Orthopedics;  Laterality: Right;    Family History  Problem Relation Age of Onset   Diabetes Mother    Hypertension Mother    Thyroid disease Mother    Ovarian cancer Mother        dx 69s   Diabetes Father    Hypertension Father    Cancer Father        STOMACH   Stomach cancer Father 72   Stroke Maternal Grandmother    Stroke Maternal Grandfather    Breast cancer Maternal Aunt 36   Cancer Paternal Grandfather        unknown type, mets to brain   Cancer Other        unknown type, father's first cousin   Cancer Paternal Uncle        unknown type, dx >50   Cancer Cousin        unknown type, dx 22s, paternal first cousin   Cancer Other        unknown types, 4 of mother's first cousins   Colon cancer Neg Hx    Rectal cancer Neg Hx     Social History   Socioeconomic History   Marital status: Married    Spouse name: Not on file   Number of children: 2   Years of education: Not on file   Highest education level: Not on file  Occupational History   Occupation: medical billing and insurance  Tobacco Use   Smoking status: Never   Smokeless tobacco: Never  Vaping Use   Vaping Use: Never used  Substance and Sexual Activity   Alcohol use: Yes    Alcohol/week: 0.0 standard drinks    Comment: Very rare   Drug use: No   Sexual activity: Yes    Birth control/protection: Surgical    Comment: HYST-1st intercourse 51 yo-Fewer  than 5 partners  Other Topics Concern   Not on file  Social History Narrative   Not on file   Social Determinants of Health   Financial Resource Strain: Not on file  Food Insecurity: Not on file  Transportation Needs: Not on file  Physical Activity: Not on file  Stress: Not on file  Social Connections: Not on file  Intimate Partner Violence: Not on file    No Known Allergies  No current facility-administered medications for this encounter.   Current Outpatient Medications  Medication Sig Dispense  Refill   albuterol (VENTOLIN HFA) 108 (90 Base) MCG/ACT inhaler Inhale 2 puffs into the lungs every 6 (six) hours as needed for wheezing or shortness of breath. 8 g 2   allopurinol (ZYLOPRIM) 300 MG tablet TAKE ONE TABLET BY MOUTH EVERY MORNING 90 tablet 1   B Complex-C-Folic Acid (RENAL) 1 MG CAPS Take 1 capsule by mouth at bedtime.     cinacalcet (SENSIPAR) 60 MG tablet Take 60 mg by mouth at bedtime.     Continuous Blood Gluc Receiver (FREESTYLE LIBRE 2 READER) DEVI 1 each by Does not apply route daily. 1 each 0   Continuous Blood Gluc Sensor (FREESTYLE LIBRE 2 SENSOR) MISC 1 each by Does not apply route every 14 (fourteen) days. 6 each 3   estradiol (ESTRACE) 0.5 MG tablet Take 1 tablet (0.5 mg total) by mouth daily. 90 tablet 1   guaiFENesin 200 MG tablet Take 1 tablet (200 mg total) by mouth every 4 (four) hours as needed for cough or to loosen phlegm. 30 suppository 0   Insulin Pen Needle 32G X 4 MM MISC Use 2x a day 200 each 3   insulin regular human CONCENTRATED (HUMULIN R U-500 KWIKPEN) 500 UNIT/ML kwikpen Inject 200 units before breakfast 160-170 units before dinner (Patient taking differently: Inject 140-200 Units into the skin See admin instructions. Inject 200 units subcutaneously before breakfast & inject 140-180 units subcutaneously before dinner) 16 mL 11   lanthanum (FOSRENOL) 1000 MG chewable tablet Chew 1,000 mg by mouth 3 (three) times daily with meals.      lidocaine-prilocaine (EMLA) cream USE ON ARAMS AS NEEDED FOR DIALYSIS  10   metoprolol succinate (TOPROL-XL) 100 MG 24 hr tablet Take 1 tablet (100 mg total) by mouth daily. Take with or immediately following a meal. 90 tablet 3   Microlet Lancets MISC Use as directed to check blood sugars 4 times per day dx: e11.22 300 each 2   MITIGARE 0.6 MG CAPS Monday, Wednesday, and Friday 90 capsule 0   pregabalin (LYRICA) 150 MG capsule Take 1 capsule (150 mg total) by mouth 2 (two) times daily. 180 capsule 1   rosuvastatin (CRESTOR) 20 MG tablet Take 1 tablet (20 mg total) by mouth daily. 90 tablet 3   Semaglutide, 2 MG/DOSE, (OZEMPIC, 2 MG/DOSE,) 8 MG/3ML SOPN Inject 2 mg into the skin once a week. 9 mL 3    PHYSICAL EXAM Vitals:   07/23/21 1430 07/23/21 1717  BP: (!) 99/59 95/61  Pulse: 90 84  Resp: 16 14  Temp: 98.3 F (36.8 C) 98.2 F (36.8 C)  TempSrc: Oral Oral  SpO2: 99% 100%    Constitutional: Well appearing. no distress. Appears well nourished.  Neurologic: CN intact. no focal findings. no sensory loss. Psychiatric:  Mood and affect symmetric and appropriate. Eyes:  No icterus. No conjunctival pallor. Ears, nose, throat:  mucous membranes moist. Midline trachea.  Cardiac: regular rate and rhythm.  Respiratory:  unlabored. Abdominal: non-distended.  Peripheral vascular: AVF LUE with good thrill. 1cm ulceration overlaying the fistula Extremity: no edema. no cyanosis. no pallor.  Skin: no gangrene. + ulceration as above.  Lymphatic: no Stemmer's sign. no palpable lymphadenopathy.  PERTINENT LABORATORY AND RADIOLOGIC DATA  Most recent CBC CBC Latest Ref Rng & Units 07/23/2021 04/01/2021 12/18/2020  WBC 4.0 - 10.5 K/uL 6.4 - 5.6  Hemoglobin 12.0 - 15.0 g/dL 8.9(L) 11.2(L) 10.4(L)  Hematocrit 36.0 - 46.0 % 27.3(L) 33.0(L) 30.3(L)  Platelets 150 - 400 K/uL 230 - 185  Most recent CMP CMP Latest Ref Rng & Units 07/23/2021 04/01/2021 12/18/2020  Glucose 70 - 99 mg/dL 200(H) 112(H)  113(H)  BUN 6 - 20 mg/dL 35(H) 28(H) 23(H)  Creatinine 0.44 - 1.00 mg/dL 10.22(H) 8.30(H) 7.83(H)  Sodium 135 - 145 mmol/L 137 137 133(L)  Potassium 3.5 - 5.1 mmol/L 3.5 3.7 3.5  Chloride 98 - 111 mmol/L 93(L) 95(L) 89(L)  CO2 22 - 32 mmol/L 30 - 31  Calcium 8.9 - 10.3 mg/dL 9.1 - 9.9  Total Protein 6.5 - 8.1 g/dL - - 7.6  Total Bilirubin 0.3 - 1.2 mg/dL - - 0.9  Alkaline Phos 38 - 126 U/L - - 51  AST 15 - 41 U/L - - 34  ALT 0 - 44 U/L - - 43    Renal function CrCl cannot be calculated (Unknown ideal weight.).  Hgb A1c MFr Bld (%)  Date Value  02/02/2021 6.3 (H)    LDL Chol Calc (NIH)  Date Value Ref Range Status  08/25/2020 63 0 - 99 mg/dL Final    Cassandra Campos. Stanford Breed, MD [facility] Phone Number: [phone] 07/23/2021 9:04 PM  Total time spent on preparing this encounter including chart review, data review, collecting history, examining the patient, coordinating care for this established patient, 30 minutes.  Portions of this report may have been transcribed using voice recognition software.  Every effort has been made to ensure accuracy; however, inadvertent computerized transcription errors may still be present.

## 2021-07-23 NOTE — Assessment & Plan Note (Addendum)
resolved 

## 2021-07-23 NOTE — Assessment & Plan Note (Addendum)
-  pt with left brachicepahlic AV fistula and now overlaying skin ulceration with increase drainage ?-venous doppler of left UE negative for DVT or stenosis ?-seen by vascular surgery: Excision of ulcer overlying left upper arm fistula and suture repair of fistula ?-spoke with Dr. Jonnie Finner-- would like her to get HD her prior to d/c and will probably be in the AM ?-culture negative ?-doubt infection- d/c abx ? ?

## 2021-07-23 NOTE — Progress Notes (Signed)
Pharmacy Antibiotic Note ? ?Cassandra Campos is a 51 y.o. female admitted on 07/23/2021 with suspected bacteremia.  Patient is ESRD TTS presenting with soreness and drainage at fistula site. Patient reports fever of 102F at home but is afebrile inpatient. Pharmacy has been consulted for vancomycin dosing. Patient reported last weight taken Tuesday of 97.4 kg.  ? ?WBC WNL, BP is 95/61, HR 84. ? ?Plan: ?Give vancomycin 2000 mg load ?No standing vanco, f/u HD schedule ?F/u clinical progress and deescalate as appropriate ? ?  ? ?Temp (24hrs), Avg:98.3 ?F (36.8 ?C), Min:98.2 ?F (36.8 ?C), Max:98.3 ?F (36.8 ?C) ? ?Recent Labs  ?Lab 07/23/21 ?1526  ?WBC 6.4  ?CREATININE 10.22*  ?  ?CrCl cannot be calculated (Unknown ideal weight.).   ? ?No Known Allergies ? ?Antimicrobials this admission: ?Vancomycin 3/9 >>  ? ?Dose adjustments this admission: ?none ? ?Microbiology results: ?3/9 BCx: IP ? ?Thank you for allowing pharmacy to participate in this patient's care. ? ?Reatha Harps, PharmD ?[id number] Pharmacy Resident ?07/23/2021 9:26 PM ?Check [email or web address] for unit specific pharmacy number ? ? ?

## 2021-07-23 NOTE — H&P (Signed)
History and Physical    Patient: Cassandra Campos ONG:[id number] DOB: August 04, 1970 DOA: 07/23/2021 DOS: the patient was seen and examined on 07/23/2021 PCP: Glendale Chard, MD  Patient coming from: Home  Chief Complaint:  Chief Complaint  Patient presents with   Vascular Access Problem   HPI: Cassandra Campos is a 51 y.o. female with medical history significant of ESRD on HD MTThursFri, insulin dependent Type 2 DM, HTN, CHF, HLD, OSA CPAP who presents with infection of her AV fistula.   Pt does dialysis at home and 2 days ago noticed increase bloody drainage of an ulcer overlaying her fistula site. Noted a temperature of 100.65F.  She presented with the symptoms to dialysis center today and had wound and blood cultures drawn and was sent to the ED.  In the ED, she was afebrile and mildly hypotensive with BP of 99/59 on room air.  Hemoglobin of 8.9 from a prior of 11.3 several months ago.  Creatinine of 10.22 from a prior of 7.  BG of 200.  Other electrolytes are unremarkable.  Venous duplex of the left upper extremity showed no DVT or stenosis.  ED physician has consulted nephrology and vascular surgery who will see in consultation after hospitalist admission.  Review of Systems: As mentioned in the history of present illness. All other systems reviewed and are negative. Past Medical History:  Diagnosis Date   Anemia associated with chronic renal failure    ASCUS of cervix with negative high risk HPV 06/2017   Back pain    Chest pain    CHF (congestive heart failure) ([place])    CKD (chronic kidney disease), stage IV ([place]) no dialysis yet   nephrologist-  dr Jamal Maes-- scheduled next visit 09-08-2017 (lov 07-05-2017)   Constipation    Diabetic retinopathy ([place])    Dialysis patient ([place])    Edema, lower extremity    Elevated transaminase level    Family history of breast cancer    Family history of ovarian cancer    Family history of stomach cancer    Fatty liver     FOLLOWED BY DR Henrene Pastor   GERD (gastroesophageal reflux disease)    Hypertension    Idiopathic gout of multiple sites    09-05-2017 per pt stable   Insulin dependent type 2 diabetes mellitus ([place])    followed by dr Toy Care   Joint pain    Mixed hyperlipidemia    OSA (obstructive sleep apnea)    per study 10-20-2015 mild osa w/ AHI 10.3/hr;  09-05-2017 per pt has not used cpap since 1 yr ago   Palpitations    Peripheral neuropathy    feet   Pure hypercholesterolemia 05/21/2021   S/P arteriovenous (AV) fistula creation 07-04-2015  w/ revision 02-14-2017   left braniocephilic   Shortness of breath    Trigger thumb, right thumb    Umbilical hernia    Vitamin D deficiency    Past Surgical History:  Procedure Laterality Date   A/V FISTULAGRAM N/A 04/01/2021   Procedure: A/V FISTULAGRAM;  Surgeon: [name], MD;  Location: [place] [facility] [facility];  Service: Cardiovascular;  Laterality: N/A;   ABDOMINAL HYSTERECTOMY  07/11/2000   dr gott   TAH/BSO for irregular bleeding and leiomyoma   AV FISTULA PLACEMENT Left 07/04/2015   Procedure: ARTERIOVENOUS (AV) FISTULA CREATION-LEFT;  Surgeon: Mal Misty, MD;  Location: [place];  Service: Vascular;  Laterality: Left;   BREAST EXCISIONAL BIOPSY Right    benign  CARPAL TUNNEL RELEASE Bilateral 1993 and [place]:  09-25-1999   BILATERAL TUBAL LIGATION W/ LAST C/S   DILATION AND CURETTAGE OF UTERUS     EXPLORATORY LAPARTOMY / LYSIS ADHESIONS/  BILATERAL SALPINGOOPHORECTOMY  07-20-2011  dr s. Rhodia Albright  [facility]   FISTULA SUPERFICIALIZATION Left 08/28/2015   Procedure: BRACHIOCEPHALIC FISTULA SUPERFICIALIZATION;  Surgeon: Mal Misty, MD;  Location: [place];  Service: Vascular;  Laterality: Left;   PATCH ANGIOPLASTY Left 02/14/2017   Procedure: PATCH ANGIOPLASTY;  Surgeon: Elam Dutch, MD;  Location: [place];  Service: Vascular;  Laterality: Left;   PELVIC LAPAROSCOPY  1994   exploration for ectopic preg.   PERIPHERAL VASCULAR  BALLOON ANGIOPLASTY  04/01/2021   Procedure: PERIPHERAL VASCULAR BALLOON ANGIOPLASTY;  Surgeon: [name], MD;  Location: [place] [facility];  Service: Cardiovascular;;   RETINAL DETACHMENT SURGERY Left 2015   REVISON OF ARTERIOVENOUS FISTULA Left 02/14/2017   Procedure: REVISON OF ARTERIOVENOUS FISTULA  LEFT ARM;  Surgeon: Elam Dutch, MD;  Location: [place];  Service: Vascular;  Laterality: Left;   TRIGGER FINGER RELEASE Left 2015   thumb   TRIGGER FINGER RELEASE Right 09/09/2017   Procedure: RELEASE TRIGGER FINGER/A-1 PULLEY RIGHT THUMB;  Surgeon: Dorna Leitz, MD;  Location: [place];  Service: Orthopedics;  Laterality: Right;   Social History:  reports that she has never smoked. She has never used smokeless tobacco. She reports current alcohol use. She reports that she does not use drugs.  No Known Allergies  Family History  Problem Relation Age of Onset   Diabetes Mother    Hypertension Mother    Thyroid disease Mother    Ovarian cancer Mother        dx 45s   Diabetes Father    Hypertension Father    Cancer Father        STOMACH   Stomach cancer Father 10   Stroke Maternal Grandmother    Stroke Maternal Grandfather    Breast cancer Maternal Aunt 62   Cancer Paternal Grandfather        unknown type, mets to brain   Cancer Other        unknown type, father's first cousin   Cancer Paternal Uncle        unknown type, dx >50   Cancer Cousin        unknown type, dx 62s, paternal first cousin   Cancer Other        unknown types, 4 of mother's first cousins   Colon cancer Neg Hx    Rectal cancer Neg Hx     Prior to Admission medications   Medication Sig Start Date End Date Taking? Authorizing Provider  albuterol (VENTOLIN HFA) 108 (90 Base) MCG/ACT inhaler Inhale 2 puffs into the lungs every 6 (six) hours as needed for wheezing or shortness of breath. 07/13/21   Minette Brine, FNP  allopurinol (ZYLOPRIM) 300 MG tablet TAKE ONE TABLET BY MOUTH  EVERY MORNING 05/18/21   Glendale Chard, MD  B Complex-C-Folic Acid (RENAL) 1 MG CAPS Take 1 capsule by mouth at bedtime. 09/18/18   [provider]  cinacalcet (SENSIPAR) 60 MG tablet Take 60 mg by mouth at bedtime.    [provider]  Continuous Blood Gluc Receiver (FREESTYLE LIBRE 2 READER) DEVI 1 each by Does not apply route daily. 07/30/20   Philemon Kingdom, MD  Continuous Blood Gluc Sensor (FREESTYLE LIBRE 2 SENSOR) MISC 1 each by Does  not apply route every 14 (fourteen) days. 07/30/20   Philemon Kingdom, MD  estradiol (ESTRACE) 0.5 MG tablet Take 1 tablet (0.5 mg total) by mouth daily. 06/04/21   Marny Lowenstein A, NP  guaiFENesin 200 MG tablet Take 1 tablet (200 mg total) by mouth every 4 (four) hours as needed for cough or to loosen phlegm. 07/13/21   Minette Brine, FNP  Insulin Pen Needle 32G X 4 MM MISC Use 2x a day 06/04/21   Philemon Kingdom, MD  insulin regular human CONCENTRATED (HUMULIN R U-500 KWIKPEN) 500 UNIT/ML kwikpen Inject 200 units before breakfast 160-170 units before dinner Patient taking differently: Inject 140-200 Units into the skin See admin instructions. Inject 200 units subcutaneously before breakfast & inject 140-180 units subcutaneously before dinner 08/18/20   Philemon Kingdom, MD  lanthanum (FOSRENOL) 1000 MG chewable tablet Chew 1,000 mg by mouth 3 (three) times daily with meals.    [provider]  lidocaine-prilocaine (EMLA) cream USE ON [place] AS NEEDED FOR DIALYSIS 03/27/18   [provider]  metoprolol succinate (TOPROL-XL) 100 MG 24 hr tablet Take 1 tablet (100 mg total) by mouth daily. Take with or immediately following a meal. 04/20/21 07/19/21  Skeet Latch, MD  Microlet Lancets MISC Use as directed to check blood sugars 4 times per day dx: e11.22 12/04/18   Glendale Chard, MD  MITIGARE 0.6 MG CAPS Monday, Wednesday, and Friday 10/30/20   Glendale Chard, MD  pregabalin (LYRICA) 150 MG capsule Take 1 capsule (150 mg total) by  mouth 2 (two) times daily. 05/19/21 05/19/22  Glendale Chard, MD  rosuvastatin (CRESTOR) 20 MG tablet Take 1 tablet (20 mg total) by mouth daily. 04/20/21   Skeet Latch, MD  Semaglutide, 2 MG/DOSE, (OZEMPIC, 2 MG/DOSE,) 8 MG/3ML SOPN Inject 2 mg into the skin once a week. 04/13/21   Philemon Kingdom, MD    Physical Exam: Vitals:   07/23/21 1430 07/23/21 1717 07/23/21 2157  BP: (!) 99/59 95/61   Pulse: 90 84   Resp: 16 14   Temp: 98.3 F (36.8 C) 98.2 F (36.8 C)   TempSrc: Oral Oral   SpO2: 99% 100%   Weight:   97.4 kg  Height:   '5\' 3"'$  (1.6 m)   Constitutional: NAD, calm, comfortable, obese female sitting upright in bed Eyes:  lids and conjunctivae normal ENMT: Mucous membranes are moist.  Neck: normal, supple Respiratory: clear to auscultation bilaterally, no wheezing, no crackles. Normal respiratory effort. No accessory muscle use.  Cardiovascular: Regular rate and rhythm, no murmurs / rubs / gallops. No extremity edema. Abdomen: no tenderness, Bowel sounds positive.  Musculoskeletal: no clubbing / cyanosis. No joint deformity upper and lower extremities.  Skin: Left AV fistula with ulceration and bloody drainage    Neurologic: CN 2-12 grossly intact. Sensation intact, DTR normal. Strength 5/5 in all 4.  Psychiatric: Normal judgment and insight. Alert and oriented x 3. Normal mood. Data Reviewed:  See HPI  Assessment and Plan: * Arteriovenous fistula infection ([place]) -pt with left brachicepahlic AV fistula and now overlaying skin ulceration with increase drainage -venous doppler of left UE negative for DVT or stenosis -seen along with vascular surgery Dr. Roselie Awkward at bedside and she likely will need take down of her fistula with placement of perm cath for alternative dialysis site -keep NPO past midnight -continue IV vancomycin and add on IV Rocephin  -blood culture pending. Wound culture was taken at dialysis site today   Hypotension BP is around 95s-99. MAP remains  above 65. Continue to monitor and hold any antihypertensive.    ESRD on hemodialysis ([place]) ESRD HD M/Tues/Thurs/Friday Last dialysis on Tues. No urgent need for HD tonight.  -Nephrology Dr. Moshe Cipro is aware and will follow in consultation tomorrow -Pt will need alterative temporary HD site as stated above     OSA (obstructive sleep apnea) Continue CPAP  Anemia of chronic kidney failure, stage 5 (HCC) -Hgb of 8.9 which has downward trended from 11.2 -appreciate nephrology  on Epo with dialysis   Type 2 diabetes mellitus with chronic kidney disease on chronic dialysis, with long-term current use of insulin (HCC) Home regimen of Humulin R U-500 at 140AM and 180PM  -start with U-500 120AM and 150PM      Advance Care Planning:   Code Status: Full Code   Consults: vascular surgery and nephrology   Family Communication: No family at bedside  Severity of Illness: The appropriate patient status for this patient is INPATIENT. Inpatient status is judged to be reasonable and necessary in order to provide the required intensity of service to ensure the patient's safety. The patient's presenting symptoms, physical exam findings, and initial radiographic and laboratory data in the context of their chronic comorbidities is felt to place them at high risk for further clinical deterioration. Furthermore, it is not anticipated that the patient will be medically stable for discharge from the hospital within 2 midnights of admission.   * I certify that at the point of admission it is my clinical judgment that the patient will require inpatient hospital care spanning beyond 2 midnights from the point of admission due to high intensity of service, high risk for further deterioration and high frequency of surveillance required.*  Author: Orene Desanctis, DO 07/23/2021 9:58 PM  For on call review [email or web address].

## 2021-07-23 NOTE — ED Triage Notes (Signed)
Patient sent to ED from vascular office for evaluation of possibly infection fistula in left upper arm. Patient states states she first noticed the skin over the fistula started to look abnormal on 2/22. Patient alert, oriented, afebrile in triage, and in no apparent distress at this time. ?

## 2021-07-23 NOTE — ED Notes (Signed)
Received verbal report from [facility] at this time ?

## 2021-07-23 NOTE — Assessment & Plan Note (Addendum)
ESRD HD M/Tues/Thurs/Friday ?-getting HD in AM per renal-- discharge after ? ?

## 2021-07-23 NOTE — ED Provider Triage Note (Signed)
Emergency Medicine Provider Triage Evaluation Note ? ?Cassandra Campos , a 51 y.o. female  was evaluated in triage.  Pt complains of left AV fistula infection.  Patient is a end-stage renal disease hemodialysis patient on Monday Tuesday Thursday Friday.  She states that she originally started noticing issues with her left AV fistula at the beginning of March.  She states that it appeared "overall."  She denied drainage at that time.  She states that beginning Tuesday she noticed swelling and drainage from the area.  She states that during her dialysis they went above the area that is of concern.  She states that last night the area that has been somewhat draining "busted" with thick material.  She does endorse intermittent fevers and decreased appetite.  She denies nausea or vomiting. . ? ?Review of Systems  ?Positive: See above ?Negative: ? ?Physical Exam  ?BP (!) 99/59 (BP Location: Right Arm)   Pulse 90   Temp 98.3 ?F (36.8 ?C) (Oral)   Resp 16   SpO2 99%  ?Gen:   Awake, no distress   ?Resp:  Normal effort  ?MSK:   Moves extremities without difficulty  ?Other:  Left AV fistula red, swollen, warm.  Fluctuance noted.  Around 0.5-1 cm area of open wound with bloody drainage ? ?Medical Decision Making  ?Medically screening exam initiated at 3:10 PM.  Appropriate orders placed.  [facility] was informed that the remainder of the evaluation will be completed by another provider, this initial triage assessment does not replace that evaluation, and the importance of remaining in the ED until their evaluation is complete. ? ? ?  ?Mickie Hillier, PA-C ?07/23/21 1511 ? ?

## 2021-07-23 NOTE — Assessment & Plan Note (Addendum)
-  resume home regimen 

## 2021-07-23 NOTE — Progress Notes (Signed)
Upper extremity venous duplex has been completed.  ? ?Preliminary results in CV Proc.  ? ?Millissa Deese Donique Hammonds ?07/23/2021 4:44 PM    ?

## 2021-07-23 NOTE — Assessment & Plan Note (Signed)
Continue CPAP.  

## 2021-07-23 NOTE — Assessment & Plan Note (Addendum)
-  Hgb of 8.9 which has downward trended from 11.2 ?-appreciate nephrology:  Epo with dialysis ? ?

## 2021-07-23 NOTE — H&P (View-Only) (Signed)
[facility] [place]  ASSESSMENT / PLAN: 51 y.o. female with ESRD dialyzing at home TTS via left brachiocepahlic arteriovenous fistula created in 2017. She has a superficial infection of her skin with ulceration. She would benefit from revision of her fistula to reduce risk of catastrophic bleeding. Recommend admission to internal medicine service. NPO after midnight. Plan revision tomorrow in operating room.   CHIEF COMPLAINT: Ulcerated AV fistula  HISTORY OF PRESENT ILLNESS: Cassandra Campos is a 51 y.o. female who presents to [facility], ER for evaluation of a left upper extremity ulcerated AV fistula.  The patient noticed scabbing and purulent drainage from her left arm fistula over the past several days.  She presented to her dialysis center for evaluation today.  They did a wound culture and instructed her to present to [facility], ER.  She usually does dialysis Tuesday Thursday and Saturday at home.  She reports chills.  She has no other constitutional symptoms of infection.  VASCULAR SURGICAL HISTORY:  Left brachiocephalic arteriovenous fistula creation 07/04/15 Superficialization of AVF 08/28/15 Revision of AVF 02/14/17 Fistulagram with angioplasty of fistula 04/01/21  Past Medical History:  Diagnosis Date   Anemia associated with chronic renal failure    ASCUS of cervix with negative high risk HPV 06/2017   Back pain    Chest pain    CHF (congestive heart failure) ([place])    CKD (chronic kidney disease), stage IV ([place]) no dialysis yet   nephrologist-  dr Jamal Maes-- scheduled next visit 09-08-2017 (lov 07-05-2017)   Constipation    Diabetic retinopathy ([place])    Dialysis patient ([place])    Edema, lower extremity    Elevated transaminase level    Family history of breast cancer    Family history of ovarian cancer    Family history of stomach cancer    Fatty liver    FOLLOWED BY DR Henrene Pastor   GERD (gastroesophageal reflux disease)    Hypertension     Idiopathic gout of multiple sites    09-05-2017 per pt stable   Insulin dependent type 2 diabetes mellitus ([place])    followed by dr Toy Care   Joint pain    Mixed hyperlipidemia    OSA (obstructive sleep apnea)    per study 10-20-2015 mild osa w/ AHI 10.3/hr;  09-05-2017 per pt has not used cpap since 1 yr ago   Palpitations    Peripheral neuropathy    feet   Pure hypercholesterolemia 05/21/2021   S/P arteriovenous (AV) fistula creation 07-04-2015  w/ revision 02-14-2017   left braniocephilic   Shortness of breath    Trigger thumb, right thumb    Umbilical hernia    Vitamin D deficiency     Past Surgical History:  Procedure Laterality Date   A/V FISTULAGRAM N/A 04/01/2021   Procedure: A/V FISTULAGRAM;  Surgeon: [name], MD;  Location: [place] [facility];  Service: Cardiovascular;  Laterality: N/A;   ABDOMINAL HYSTERECTOMY  07/11/2000   dr gott   TAH/BSO for irregular bleeding and leiomyoma   AV FISTULA PLACEMENT Left 07/04/2015   Procedure: ARTERIOVENOUS (AV) FISTULA CREATION-LEFT;  Surgeon: Mal Misty, MD;  Location: [place];  Service: Vascular;  Laterality: Left;   BREAST EXCISIONAL BIOPSY Right    benign   CARPAL TUNNEL RELEASE Bilateral 1993 and [place]:  09-25-1999   BILATERAL TUBAL LIGATION W/ LAST C/S   DILATION AND CURETTAGE OF UTERUS     EXPLORATORY LAPARTOMY /  LYSIS ADHESIONS/  BILATERAL SALPINGOOPHORECTOMY  07-20-2011  dr s. Rhodia Albright  [facility]   FISTULA SUPERFICIALIZATION Left 08/28/2015   Procedure: BRACHIOCEPHALIC FISTULA SUPERFICIALIZATION;  Surgeon: Mal Misty, MD;  Location: [place];  Service: Vascular;  Laterality: Left;   PATCH ANGIOPLASTY Left 02/14/2017   Procedure: PATCH ANGIOPLASTY;  Surgeon: Elam Dutch, MD;  Location: [place];  Service: Vascular;  Laterality: Left;   PELVIC LAPAROSCOPY  1994   exploration for ectopic preg.   PERIPHERAL VASCULAR BALLOON ANGIOPLASTY  04/01/2021   Procedure: PERIPHERAL VASCULAR BALLOON  ANGIOPLASTY;  Surgeon: [name], MD;  Location: [place] [facility];  Service: Cardiovascular;;   RETINAL DETACHMENT SURGERY Left 2015   REVISON OF ARTERIOVENOUS FISTULA Left 02/14/2017   Procedure: REVISON OF ARTERIOVENOUS FISTULA  LEFT ARM;  Surgeon: Elam Dutch, MD;  Location: [place];  Service: Vascular;  Laterality: Left;   TRIGGER FINGER RELEASE Left 2015   thumb   TRIGGER FINGER RELEASE Right 09/09/2017   Procedure: RELEASE TRIGGER FINGER/A-1 PULLEY RIGHT THUMB;  Surgeon: Dorna Leitz, MD;  Location: [place];  Service: Orthopedics;  Laterality: Right;    Family History  Problem Relation Age of Onset   Diabetes Mother    Hypertension Mother    Thyroid disease Mother    Ovarian cancer Mother        dx 43s   Diabetes Father    Hypertension Father    Cancer Father        STOMACH   Stomach cancer Father 31   Stroke Maternal Grandmother    Stroke Maternal Grandfather    Breast cancer Maternal Aunt 42   Cancer Paternal Grandfather        unknown type, mets to brain   Cancer Other        unknown type, father's first cousin   Cancer Paternal Uncle        unknown type, dx >50   Cancer Cousin        unknown type, dx 42s, paternal first cousin   Cancer Other        unknown types, 4 of mother's first cousins   Colon cancer Neg Hx    Rectal cancer Neg Hx     Social History   Socioeconomic History   Marital status: Married    Spouse name: Not on file   Number of children: 2   Years of education: Not on file   Highest education level: Not on file  Occupational History   Occupation: medical billing and insurance  Tobacco Use   Smoking status: Never   Smokeless tobacco: Never  Vaping Use   Vaping Use: Never used  Substance and Sexual Activity   Alcohol use: Yes    Alcohol/week: 0.0 standard drinks    Comment: Very rare   Drug use: No   Sexual activity: Yes    Birth control/protection: Surgical    Comment: HYST-1st intercourse 51 yo-Fewer  than 5 partners  Other Topics Concern   Not on file  Social History Narrative   Not on file   Social Determinants of Health   Financial Resource Strain: Not on file  Food Insecurity: Not on file  Transportation Needs: Not on file  Physical Activity: Not on file  Stress: Not on file  Social Connections: Not on file  Intimate Partner Violence: Not on file    No Known Allergies  No current facility-administered medications for this encounter.   Current Outpatient Medications  Medication Sig Dispense  Refill   albuterol (VENTOLIN HFA) 108 (90 Base) MCG/ACT inhaler Inhale 2 puffs into the lungs every 6 (six) hours as needed for wheezing or shortness of breath. 8 g 2   allopurinol (ZYLOPRIM) 300 MG tablet TAKE ONE TABLET BY MOUTH EVERY MORNING 90 tablet 1   B Complex-C-Folic Acid (RENAL) 1 MG CAPS Take 1 capsule by mouth at bedtime.     cinacalcet (SENSIPAR) 60 MG tablet Take 60 mg by mouth at bedtime.     Continuous Blood Gluc Receiver (FREESTYLE LIBRE 2 READER) DEVI 1 each by Does not apply route daily. 1 each 0   Continuous Blood Gluc Sensor (FREESTYLE LIBRE 2 SENSOR) MISC 1 each by Does not apply route every 14 (fourteen) days. 6 each 3   estradiol (ESTRACE) 0.5 MG tablet Take 1 tablet (0.5 mg total) by mouth daily. 90 tablet 1   guaiFENesin 200 MG tablet Take 1 tablet (200 mg total) by mouth every 4 (four) hours as needed for cough or to loosen phlegm. 30 suppository 0   Insulin Pen Needle 32G X 4 MM MISC Use 2x a day 200 each 3   insulin regular human CONCENTRATED (HUMULIN R U-500 KWIKPEN) 500 UNIT/ML kwikpen Inject 200 units before breakfast 160-170 units before dinner (Patient taking differently: Inject 140-200 Units into the skin See admin instructions. Inject 200 units subcutaneously before breakfast & inject 140-180 units subcutaneously before dinner) 16 mL 11   lanthanum (FOSRENOL) 1000 MG chewable tablet Chew 1,000 mg by mouth 3 (three) times daily with meals.      lidocaine-prilocaine (EMLA) cream USE ON ARAMS AS NEEDED FOR DIALYSIS  10   metoprolol succinate (TOPROL-XL) 100 MG 24 hr tablet Take 1 tablet (100 mg total) by mouth daily. Take with or immediately following a meal. 90 tablet 3   Microlet Lancets MISC Use as directed to check blood sugars 4 times per day dx: e11.22 300 each 2   MITIGARE 0.6 MG CAPS Monday, Wednesday, and Friday 90 capsule 0   pregabalin (LYRICA) 150 MG capsule Take 1 capsule (150 mg total) by mouth 2 (two) times daily. 180 capsule 1   rosuvastatin (CRESTOR) 20 MG tablet Take 1 tablet (20 mg total) by mouth daily. 90 tablet 3   Semaglutide, 2 MG/DOSE, (OZEMPIC, 2 MG/DOSE,) 8 MG/3ML SOPN Inject 2 mg into the skin once a week. 9 mL 3    PHYSICAL EXAM Vitals:   07/23/21 1430 07/23/21 1717  BP: (!) 99/59 95/61  Pulse: 90 84  Resp: 16 14  Temp: 98.3 F (36.8 C) 98.2 F (36.8 C)  TempSrc: Oral Oral  SpO2: 99% 100%    Constitutional: Well appearing. no distress. Appears well nourished.  Neurologic: CN intact. no focal findings. no sensory loss. Psychiatric:  Mood and affect symmetric and appropriate. Eyes:  No icterus. No conjunctival pallor. Ears, nose, throat:  mucous membranes moist. Midline trachea.  Cardiac: regular rate and rhythm.  Respiratory:  unlabored. Abdominal: non-distended.  Peripheral vascular: AVF LUE with good thrill. 1cm ulceration overlaying the fistula Extremity: no edema. no cyanosis. no pallor.  Skin: no gangrene. + ulceration as above.  Lymphatic: no Stemmer's sign. no palpable lymphadenopathy.  PERTINENT LABORATORY AND RADIOLOGIC DATA  Most recent CBC CBC Latest Ref Rng & Units 07/23/2021 04/01/2021 12/18/2020  WBC 4.0 - 10.5 K/uL 6.4 - 5.6  Hemoglobin 12.0 - 15.0 g/dL 8.9(L) 11.2(L) 10.4(L)  Hematocrit 36.0 - 46.0 % 27.3(L) 33.0(L) 30.3(L)  Platelets 150 - 400 K/uL 230 - 185  Most recent CMP CMP Latest Ref Rng & Units 07/23/2021 04/01/2021 12/18/2020  Glucose 70 - 99 mg/dL 200(H) 112(H)  113(H)  BUN 6 - 20 mg/dL 35(H) 28(H) 23(H)  Creatinine 0.44 - 1.00 mg/dL 10.22(H) 8.30(H) 7.83(H)  Sodium 135 - 145 mmol/L 137 137 133(L)  Potassium 3.5 - 5.1 mmol/L 3.5 3.7 3.5  Chloride 98 - 111 mmol/L 93(L) 95(L) 89(L)  CO2 22 - 32 mmol/L 30 - 31  Calcium 8.9 - 10.3 mg/dL 9.1 - 9.9  Total Protein 6.5 - 8.1 g/dL - - 7.6  Total Bilirubin 0.3 - 1.2 mg/dL - - 0.9  Alkaline Phos 38 - 126 U/L - - 51  AST 15 - 41 U/L - - 34  ALT 0 - 44 U/L - - 43    Renal function CrCl cannot be calculated (Unknown ideal weight.).  Hgb A1c MFr Bld (%)  Date Value  02/02/2021 6.3 (H)    LDL Chol Calc (NIH)  Date Value Ref Range Status  08/25/2020 63 0 - 99 mg/dL Final    Yevonne Aline. Stanford Breed, MD [facility] Phone Number: [phone] 07/23/2021 9:04 PM  Total time spent on preparing this encounter including chart review, data review, collecting history, examining the patient, coordinating care for this established patient, 30 minutes.  Portions of this report may have been transcribed using voice recognition software.  Every effort has been made to ensure accuracy; however, inadvertent computerized transcription errors may still be present.

## 2021-07-23 NOTE — ED Provider Notes (Signed)
[facility] EMERGENCY DEPARTMENT Provider Note   CSN: [id number] Arrival date & time: 07/23/21  1414     History  Chief Complaint  Patient presents with   Vascular Access Problem    Cassandra Campos is a 51 y.o. female.  HPI Patient has history of chronic kidney disease and diabetes..  She is on dialysis Tuesday Thursday and Saturday.  Patient does dialysis at home Patient presented to the ED for evaluation of possible infection at her fistula site.  Patient states she has had a little sore over her fistula.  Yesterday she noticed that the wound popped open and there was some drainage.  She states she saw the PA at her kidney doctors office.  They were concerned about possible infection at her AV fistula so she was sent to the ED.  Patient has had fevers at home up to 102.  She denies any vomiting or diarrhea.  Home Medications Prior to Admission medications   Medication Sig Start Date End Date Taking? Authorizing Provider  albuterol (VENTOLIN HFA) 108 (90 Base) MCG/ACT inhaler Inhale 2 puffs into the lungs every 6 (six) hours as needed for wheezing or shortness of breath. 07/13/21   Minette Brine, FNP  allopurinol (ZYLOPRIM) 300 MG tablet TAKE ONE TABLET BY MOUTH EVERY MORNING 05/18/21   Glendale Chard, MD  B Complex-C-Folic Acid (RENAL) 1 MG CAPS Take 1 capsule by mouth at bedtime. 09/18/18   [provider]  cinacalcet (SENSIPAR) 60 MG tablet Take 60 mg by mouth at bedtime.    [provider]  Continuous Blood Gluc Receiver (FREESTYLE LIBRE 2 READER) DEVI 1 each by Does not apply route daily. 07/30/20   Philemon Kingdom, MD  Continuous Blood Gluc Sensor (FREESTYLE LIBRE 2 SENSOR) MISC 1 each by Does not apply route every 14 (fourteen) days. 07/30/20   Philemon Kingdom, MD  estradiol (ESTRACE) 0.5 MG tablet Take 1 tablet (0.5 mg total) by mouth daily. 06/04/21   Marny Lowenstein A, NP  guaiFENesin 200 MG tablet Take 1 tablet (200 mg total) by  mouth every 4 (four) hours as needed for cough or to loosen phlegm. 07/13/21   Minette Brine, FNP  Insulin Pen Needle 32G X 4 MM MISC Use 2x a day 06/04/21   Philemon Kingdom, MD  insulin regular human CONCENTRATED (HUMULIN R U-500 KWIKPEN) 500 UNIT/ML kwikpen Inject 200 units before breakfast 160-170 units before dinner Patient taking differently: Inject 140-200 Units into the skin See admin instructions. Inject 200 units subcutaneously before breakfast & inject 140-180 units subcutaneously before dinner 08/18/20   Philemon Kingdom, MD  lanthanum (FOSRENOL) 1000 MG chewable tablet Chew 1,000 mg by mouth 3 (three) times daily with meals.    [provider]  lidocaine-prilocaine (EMLA) cream USE ON ARAMS AS NEEDED FOR DIALYSIS 03/27/18   [provider]  metoprolol succinate (TOPROL-XL) 100 MG 24 hr tablet Take 1 tablet (100 mg total) by mouth daily. Take with or immediately following a meal. 04/20/21 07/19/21  Skeet Latch, MD  Microlet Lancets MISC Use as directed to check blood sugars 4 times per day dx: e11.22 12/04/18   Glendale Chard, MD  MITIGARE 0.6 MG CAPS Monday, Wednesday, and Friday 10/30/20   Glendale Chard, MD  pregabalin (LYRICA) 150 MG capsule Take 1 capsule (150 mg total) by mouth 2 (two) times daily. 05/19/21 05/19/22  Glendale Chard, MD  rosuvastatin (CRESTOR) 20 MG tablet Take 1 tablet (20 mg total) by mouth daily. 04/20/21   Skeet Latch,  MD  Semaglutide, 2 MG/DOSE, (OZEMPIC, 2 MG/DOSE,) 8 MG/3ML SOPN Inject 2 mg into the skin once a week. 04/13/21   Philemon Kingdom, MD      Allergies    Patient has no known allergies.    Review of Systems   Review of Systems  Physical Exam Updated Vital Signs BP 95/61 (BP Location: Right Arm)    Pulse 84    Temp 98.2 F (36.8 C) (Oral)    Resp 14    SpO2 100%  Physical Exam Vitals and nursing note reviewed.  Constitutional:      General: She is not in acute distress.    Appearance: She is well-developed.  HENT:      Head: Normocephalic and atraumatic.     Right Ear: External ear normal.     Left Ear: External ear normal.  Eyes:     General: No scleral icterus.       Right eye: No discharge.        Left eye: No discharge.     Conjunctiva/sclera: Conjunctivae normal.  Neck:     Trachea: No tracheal deviation.  Cardiovascular:     Rate and Rhythm: Normal rate and regular rhythm.  Pulmonary:     Effort: Pulmonary effort is normal. No respiratory distress.     Breath sounds: Normal breath sounds. No stridor. No wheezing or rales.  Abdominal:     General: Bowel sounds are normal. There is no distension.     Palpations: Abdomen is soft.     Tenderness: There is no abdominal tenderness. There is no guarding or rebound.  Musculoskeletal:        General: No tenderness or deformity.     Cervical back: Neck supple.     Comments: Palpable thrill left AV fistula, small ulceration noted at the proximal aspect with some serosanguineous oozing,, no surrounding erythema  Skin:    General: Skin is warm and dry.     Findings: No rash.  Neurological:     General: No focal deficit present.     Mental Status: She is alert.     Cranial Nerves: No cranial nerve deficit (no facial droop, extraocular movements intact, no slurred speech).     Sensory: No sensory deficit.     Motor: No abnormal muscle tone or seizure activity.     Coordination: Coordination normal.  Psychiatric:        Mood and Affect: Mood normal.     ED Results / Procedures / Treatments   Labs (all labs ordered are listed, but only abnormal results are displayed) Labs Reviewed  BASIC METABOLIC PANEL - Abnormal; Notable for the following components:      Result Value   Chloride 93 (*)    Glucose, Bld 200 (*)    BUN 35 (*)    Creatinine, Ser 10.22 (*)    GFR, Estimated 4 (*)    All other components within normal limits  CBC WITH DIFFERENTIAL/PLATELET - Abnormal; Notable for the following components:   RBC 2.79 (*)    Hemoglobin 8.9 (*)     HCT 27.3 (*)    All other components within normal limits  CULTURE, BLOOD (ROUTINE X 2)  CULTURE, BLOOD (ROUTINE X 2)  RESP PANEL BY RT-PCR (FLU A&B, COVID) ARPGX2    EKG None  Radiology UE VENOUS DUPLEX (7am - 7pm)  Result Date: 07/23/2021 UPPER VENOUS STUDY  Patient Name:  DARLYS BUIS  Date of Exam:   07/23/2021 Medical Rec #:  [id number]                 Accession #:    [id number] Date of Birth: [birth year]                 Patient Gender: F Patient Age:   23 years Exam Location:  [facility] Procedure:      VAS [place] UPPER EXTREMITY VENOUS DUPLEX Referring Phys: Theodis Blaze --------------------------------------------------------------------------------  Indications: Pain Other Indications: Lt AV fistula. Comparison Study: 02/10/17 prior Performing Technologist: Archie Patten RVS  Examination Guidelines: A complete evaluation includes B-mode imaging, spectral Doppler, color Doppler, and power Doppler as needed of all accessible portions of each vessel. Bilateral testing is considered an integral part of a complete examination. Limited examinations for reoccurring indications may be performed as noted.  Right Findings: +----------+------------+---------+-----------+----------+-------+  RIGHT      Compressible Phasicity Spontaneous Properties Summary  +----------+------------+---------+-----------+----------+-------+  Subclavian                 Yes        Yes                         +----------+------------+---------+-----------+----------+-------+  Left Findings: +----------+------------+---------+-----------+----------+-------+  LEFT       Compressible Phasicity Spontaneous Properties Summary  +----------+------------+---------+-----------+----------+-------+  IJV            Full        Yes        Yes                         +----------+------------+---------+-----------+----------+-------+  Subclavian     Full        Yes        Yes                          +----------+------------+---------+-----------+----------+-------+  Axillary       Full        Yes        Yes                         +----------+------------+---------+-----------+----------+-------+  Brachial       Full        Yes        Yes                         +----------+------------+---------+-----------+----------+-------+  Radial         Full                                               +----------+------------+---------+-----------+----------+-------+  Ulnar          Full                                               +----------+------------+---------+-----------+----------+-------+  Cephalic       Full                                               +----------+------------+---------+-----------+----------+-------+  Basilic        Full                                               +----------+------------+---------+-----------+----------+-------+  Summary:  Right: No evidence of thrombosis in the subclavian.  Left: No evidence of deep vein thrombosis in the upper extremity. No evidence of superficial vein thrombosis in the upper extremity. Fisula appears patent with no obvious evidence of stenosis.  *See table(s) above for measurements and observations.  Diagnosing physician: Jamelle Haring Electronically signed by Jamelle Haring on 07/23/2021 at 8:58:00 PM.    Final     Procedures Procedures    Medications Ordered in ED Medications - No data to display  ED Course/ Medical Decision Making/ A&P Clinical Course as of 07/23/21 2105  Thu Jul 23, 2021  2010 Last dialysis was Tuesday.  Pt has home dialysis.   T,TH, SAT [JK]  2036 Discussed with Dr Clover Mealy.  She reviewed notes from the outpatient visit.  Pt did have cultures taken as an outpatient.  Would recommend admit, iv abx, vascular surgery consult [JK]  2045 D/w Dr Stanford Breed vascular surgery.  He will come evaluate the patient. [JK]  2104 Case discussed with Dr Flossie Buffy [JK]    Clinical Course User Index [JK] Dorie Rank, MD                            Medical Decision Making Risk Decision regarding hospitalization.   Patient presented to the ED for evaluation of ulceration at her AV fistula site.  Patient also had fevers at home.  Symptoms were concerning for the possibility bacteremia.  Patient is afebrile here.  Vital signs do not show any leukocytosis.  Her electrolytes are normal and consistent with her known chronic renal failure but no indications for emergent dialysis.  Discussed the case with Dr. Moshe Cipro nephrology.  Recommends at this time that we admit the patient for IV antibiotics.  Patient appears to have local infection but no signs of systemic infection at this time.  I spoke with Dr. Stanford Breed, vascular surgery.  He will come and evaluate the patient.  I will consult with the medical service for admission        Final Clinical Impression(s) / ED Diagnoses Final diagnoses:  Arteriovenous fistula infection, initial encounter ([place])  ESRD (end stage renal disease) ([place])        Dorie Rank, MD 07/23/21 2105

## 2021-07-23 NOTE — ED Notes (Addendum)
Pt in room at this time  

## 2021-07-24 ENCOUNTER — Encounter (HOSPITAL_COMMUNITY)
Admission: EM | Disposition: A | Discharge status: Home / Self Care | Payer: Self-pay | Source: Home / Self Care | Attending: Internal Medicine

## 2021-07-24 ENCOUNTER — Inpatient Hospital Stay (HOSPITAL_COMMUNITY): Payer: Medicare Other | Admitting: Certified Registered"

## 2021-07-24 ENCOUNTER — Encounter (HOSPITAL_COMMUNITY): Payer: Self-pay | Admitting: Family Medicine

## 2021-07-24 ENCOUNTER — Other Ambulatory Visit: Payer: Self-pay

## 2021-07-24 DIAGNOSIS — N185 Chronic kidney disease, stage 5: Secondary | ICD-10-CM

## 2021-07-24 DIAGNOSIS — N186 End stage renal disease: Secondary | ICD-10-CM

## 2021-07-24 DIAGNOSIS — T82898A Other specified complication of vascular prosthetic devices, implants and grafts, initial encounter: Secondary | ICD-10-CM

## 2021-07-24 DIAGNOSIS — E1122 Type 2 diabetes mellitus with diabetic chronic kidney disease: Secondary | ICD-10-CM

## 2021-07-24 DIAGNOSIS — I132 Hypertensive heart and chronic kidney disease with heart failure and with stage 5 chronic kidney disease, or end stage renal disease: Secondary | ICD-10-CM

## 2021-07-24 DIAGNOSIS — I509 Heart failure, unspecified: Secondary | ICD-10-CM

## 2021-07-24 HISTORY — PX: REVISON OF ARTERIOVENOUS FISTULA: SHX6074

## 2021-07-24 LAB — GLUCOSE, CAPILLARY
Glucose-Capillary: 148 mg/dL — ABNORMAL HIGH (ref 70–99)
Glucose-Capillary: 116 mg/dL — ABNORMAL HIGH (ref 70–99)
Glucose-Capillary: 280 mg/dL — ABNORMAL HIGH (ref 70–99)
Glucose-Capillary: 396 mg/dL — ABNORMAL HIGH (ref 70–99)

## 2021-07-24 LAB — CBC
HCT: 26.3 % — ABNORMAL LOW (ref 36.0–46.0)
Hemoglobin: 8.7 g/dL — ABNORMAL LOW (ref 12.0–15.0)
MCH: 32.2 pg (ref 26.0–34.0)
MCHC: 33.1 g/dL (ref 30.0–36.0)
MCV: 97.4 fL (ref 80.0–100.0)
Platelets: 214 10*3/uL (ref 150–400)
RBC: 2.7 MIL/uL — ABNORMAL LOW (ref 3.87–5.11)
RDW: 12.9 % (ref 11.5–15.5)
WBC: 6 10*3/uL (ref 4.0–10.5)
nRBC: 0 % (ref 0.0–0.2)

## 2021-07-24 LAB — BASIC METABOLIC PANEL
Anion gap: 16 — ABNORMAL HIGH (ref 5–15)
BUN: 47 mg/dL — ABNORMAL HIGH (ref 6–20)
CO2: 28 mmol/L (ref 22–32)
Calcium: 8.8 mg/dL — ABNORMAL LOW (ref 8.9–10.3)
Chloride: 92 mmol/L — ABNORMAL LOW (ref 98–111)
Creatinine, Ser: 11.16 mg/dL — ABNORMAL HIGH (ref 0.44–1.00)
GFR, Estimated: 4 mL/min — ABNORMAL LOW (ref >60)
Glucose, Bld: 156 mg/dL — ABNORMAL HIGH (ref 70–99)
Potassium: 4 mmol/L (ref 3.5–5.1)
Sodium: 136 mmol/L (ref 135–145)

## 2021-07-24 LAB — CBG MONITORING, ED
Glucose-Capillary: 154 mg/dL — ABNORMAL HIGH (ref 70–99)
Glucose-Capillary: 172 mg/dL — ABNORMAL HIGH (ref 70–99)

## 2021-07-24 SURGERY — REVISON OF ARTERIOVENOUS FISTULA
Anesthesia: General | Laterality: Left

## 2021-07-24 MED ORDER — HEPARIN SODIUM (PORCINE) 1000 UNIT/ML IJ SOLN
INTRAMUSCULAR | Status: AC
  Filled 2021-07-24: qty 10

## 2021-07-24 MED ORDER — ACETAMINOPHEN 10 MG/ML IV SOLN: 1000.0000 mg | Freq: Once | INTRAVENOUS | Status: DC | PRN

## 2021-07-24 MED ORDER — ACETAMINOPHEN 160 MG/5ML PO SOLN: 1000.0000 mg | Freq: Once | ORAL | Status: DC | PRN

## 2021-07-24 MED ORDER — FENTANYL CITRATE (PF) 100 MCG/2ML IJ SOLN: 25.0000 ug | INTRAMUSCULAR | Status: DC | PRN

## 2021-07-24 MED ORDER — LANTHANUM CARBONATE 500 MG PO CHEW
1000.0000 mg | CHEWABLE_TABLET | Freq: Three times a day (TID) | ORAL | Status: DC
  Administered 2021-07-24 – 2021-07-25 (×2): 1000 mg via ORAL
  Filled 2021-07-24 (×4): qty 2

## 2021-07-24 MED ORDER — COLCHICINE 0.6 MG PO TABS
0.6000 mg | ORAL_TABLET | Freq: Every day | ORAL | Status: DC
  Administered 2021-07-25: 0.6 mg via ORAL
  Filled 2021-07-24 (×2): qty 1

## 2021-07-24 MED ORDER — HEPARIN SODIUM (PORCINE) 1000 UNIT/ML DIALYSIS
3500.0000 [IU] | INTRAMUSCULAR | Status: DC | PRN
  Filled 2021-07-24: qty 4

## 2021-07-24 MED ORDER — HEPARIN 6000 UNIT IRRIGATION SOLUTION
Status: AC
  Filled 2021-07-24: qty 500

## 2021-07-24 MED ORDER — PROPOFOL 10 MG/ML IV BOLUS
INTRAVENOUS | Status: AC
  Filled 2021-07-24: qty 20

## 2021-07-24 MED ORDER — 0.9 % SODIUM CHLORIDE (POUR BTL) OPTIME
TOPICAL | Status: DC | PRN
  Administered 2021-07-24: 1000 mL

## 2021-07-24 MED ORDER — FENTANYL CITRATE (PF) 250 MCG/5ML IJ SOLN
INTRAMUSCULAR | Status: AC
  Filled 2021-07-24: qty 5

## 2021-07-24 MED ORDER — FENTANYL CITRATE (PF) 250 MCG/5ML IJ SOLN
INTRAMUSCULAR | Status: DC | PRN
  Administered 2021-07-24: 50 ug via INTRAVENOUS

## 2021-07-24 MED ORDER — LIDOCAINE-EPINEPHRINE (PF) 1 %-1:200000 IJ SOLN
INTRAMUSCULAR | Status: AC
  Filled 2021-07-24: qty 30

## 2021-07-24 MED ORDER — DEXAMETHASONE SODIUM PHOSPHATE 10 MG/ML IJ SOLN
INTRAMUSCULAR | Status: AC
  Filled 2021-07-24: qty 1

## 2021-07-24 MED ORDER — INSULIN ASPART 100 UNIT/ML IJ SOLN
0.0000 [IU] | Freq: Three times a day (TID) | INTRAMUSCULAR | Status: DC
  Administered 2021-07-25: 1 [IU] via SUBCUTANEOUS

## 2021-07-24 MED ORDER — PHENYLEPHRINE 40 MCG/ML (10ML) SYRINGE FOR IV PUSH (FOR BLOOD PRESSURE SUPPORT)
PREFILLED_SYRINGE | INTRAVENOUS | Status: DC | PRN
  Administered 2021-07-24 (×4): 80 ug via INTRAVENOUS

## 2021-07-24 MED ORDER — ORAL CARE MOUTH RINSE: 15.0000 mL | Freq: Once | OROMUCOSAL | Status: AC

## 2021-07-24 MED ORDER — CHLORHEXIDINE GLUCONATE 0.12 % MT SOLN
15.0000 mL | Freq: Once | OROMUCOSAL | Status: AC
  Administered 2021-07-24: 15 mL via OROMUCOSAL
  Filled 2021-07-24: qty 15

## 2021-07-24 MED ORDER — CINACALCET HCL 30 MG PO TABS
60.0000 mg | ORAL_TABLET | Freq: Every day | ORAL | Status: DC
  Administered 2021-07-24: 60 mg via ORAL
  Filled 2021-07-24: qty 2

## 2021-07-24 MED ORDER — INSULIN REGULAR HUMAN (CONC) 500 UNIT/ML ~~LOC~~ SOLN
60.0000 [IU] | Freq: Once | SUBCUTANEOUS | Status: AC
  Administered 2021-07-24: 60 [IU] via SUBCUTANEOUS

## 2021-07-24 MED ORDER — PREGABALIN 75 MG PO CAPS
150.0000 mg | ORAL_CAPSULE | Freq: Two times a day (BID) | ORAL | Status: DC
  Administered 2021-07-24 – 2021-07-25 (×2): 150 mg via ORAL
  Filled 2021-07-24 (×2): qty 2

## 2021-07-24 MED ORDER — SODIUM CHLORIDE 0.9 % IV SOLN: INTRAVENOUS | Status: DC

## 2021-07-24 MED ORDER — INSULIN REGULAR HUMAN (CONC) 500 UNIT/ML ~~LOC~~ SOPN
150.0000 [IU] | PEN_INJECTOR | Freq: Every day | SUBCUTANEOUS | Status: DC
  Administered 2021-07-24: 150 [IU] via SUBCUTANEOUS

## 2021-07-24 MED ORDER — RENA-VITE PO TABS
1.0000 | ORAL_TABLET | Freq: Every day | ORAL | Status: DC
  Administered 2021-07-24: 1 via ORAL
  Filled 2021-07-24: qty 1

## 2021-07-24 MED ORDER — LIDOCAINE 2% (20 MG/ML) 5 ML SYRINGE
INTRAMUSCULAR | Status: AC
  Filled 2021-07-24: qty 5

## 2021-07-24 MED ORDER — ROSUVASTATIN CALCIUM 20 MG PO TABS
20.0000 mg | ORAL_TABLET | Freq: Every day | ORAL | Status: DC
  Administered 2021-07-25: 20 mg via ORAL
  Filled 2021-07-24: qty 1

## 2021-07-24 MED ORDER — INSULIN REGULAR HUMAN (CONC) 500 UNIT/ML ~~LOC~~ SOPN: 120.0000 [IU] | PEN_INJECTOR | Freq: Every day | SUBCUTANEOUS | Status: DC

## 2021-07-24 MED ORDER — VANCOMYCIN VARIABLE DOSE PER UNSTABLE RENAL FUNCTION (PHARMACIST DOSING): Status: DC

## 2021-07-24 MED ORDER — INSULIN ASPART 100 UNIT/ML IJ SOLN
0.0000 [IU] | INTRAMUSCULAR | Status: DC
  Administered 2021-07-24: 5 [IU] via SUBCUTANEOUS
  Administered 2021-07-24: 1 [IU] via SUBCUTANEOUS

## 2021-07-24 MED ORDER — HEPARIN 6000 UNIT IRRIGATION SOLUTION
Status: DC | PRN
  Administered 2021-07-24: 1

## 2021-07-24 MED ORDER — VANCOMYCIN HCL IN DEXTROSE 1-5 GM/200ML-% IV SOLN
1000.0000 mg | INTRAVENOUS | Status: DC
Start: 2021-07-25 — End: 2021-07-25
  Filled 2021-07-24: qty 200

## 2021-07-24 MED ORDER — OXYCODONE HCL 5 MG PO TABS: 5.0000 mg | ORAL_TABLET | ORAL | 0 refills | Status: DC | PRN

## 2021-07-24 MED ORDER — CHLORHEXIDINE GLUCONATE CLOTH 2 % EX PADS: 6.0000 | MEDICATED_PAD | Freq: Every day | CUTANEOUS | Status: DC

## 2021-07-24 MED ORDER — ALLOPURINOL 300 MG PO TABS
300.0000 mg | ORAL_TABLET | Freq: Every day | ORAL | Status: DC
Start: 2021-07-24 — End: 2021-07-25
  Administered 2021-07-25: 300 mg via ORAL
  Filled 2021-07-24: qty 1

## 2021-07-24 MED ORDER — ONDANSETRON HCL 4 MG/2ML IJ SOLN
INTRAMUSCULAR | Status: AC
  Filled 2021-07-24: qty 2

## 2021-07-24 MED ORDER — LIDOCAINE 2% (20 MG/ML) 5 ML SYRINGE
INTRAMUSCULAR | Status: DC | PRN
  Administered 2021-07-24: 80 mg via INTRAVENOUS

## 2021-07-24 MED ORDER — PROPOFOL 10 MG/ML IV BOLUS
INTRAVENOUS | Status: DC | PRN
  Administered 2021-07-24: 140 mg via INTRAVENOUS

## 2021-07-24 MED ORDER — PROTAMINE SULFATE 10 MG/ML IV SOLN
INTRAVENOUS | Status: AC
  Filled 2021-07-24: qty 5

## 2021-07-24 MED ORDER — METOPROLOL SUCCINATE ER 100 MG PO TB24
100.0000 mg | ORAL_TABLET | Freq: Every day | ORAL | Status: DC
Start: 2021-07-24 — End: 2021-07-25
  Administered 2021-07-24: 100 mg via ORAL
  Filled 2021-07-24 (×2): qty 1
  Filled 2021-07-24: qty 4

## 2021-07-24 MED ORDER — ESTRADIOL 1 MG PO TABS
0.5000 mg | ORAL_TABLET | Freq: Every day | ORAL | Status: DC
  Administered 2021-07-25: 0.5 mg via ORAL
  Filled 2021-07-24 (×2): qty 0.5

## 2021-07-24 MED ORDER — ACETAMINOPHEN 500 MG PO TABS: 1000.0000 mg | ORAL_TABLET | Freq: Once | ORAL | Status: DC | PRN

## 2021-07-24 MED ORDER — DEXAMETHASONE SODIUM PHOSPHATE 10 MG/ML IJ SOLN
INTRAMUSCULAR | Status: DC | PRN
  Administered 2021-07-24: 5 mg via INTRAVENOUS

## 2021-07-24 MED ORDER — ONDANSETRON HCL 4 MG/2ML IJ SOLN
INTRAMUSCULAR | Status: DC | PRN
  Administered 2021-07-24: 4 mg via INTRAVENOUS

## 2021-07-24 SURGICAL SUPPLY — 56 items
ARMBAND PINK RESTRICT EXTREMIT (MISCELLANEOUS) ×3 IMPLANT
BAG COUNTER SPONGE SURGICOUNT (BAG) ×3 IMPLANT
BAG DECANTER FOR FLEXI CONT (MISCELLANEOUS) ×1 IMPLANT
BIOPATCH RED 1 DISK 7.0 (GAUZE/BANDAGES/DRESSINGS) ×1 IMPLANT
CANISTER SUCT 3000ML PPV (MISCELLANEOUS) ×3 IMPLANT
CANNULA VESSEL 3MM 2 BLNT TIP (CANNULA) ×3 IMPLANT
CATH PALINDROME-P 19CM W/VT (CATHETERS) IMPLANT
CATH PALINDROME-P 23CM W/VT (CATHETERS) IMPLANT
CATH PALINDROME-P 28CM W/VT (CATHETERS) IMPLANT
CHLORAPREP W/TINT 26 (MISCELLANEOUS) ×1 IMPLANT
CLIP VESOCCLUDE MED 6/CT (CLIP) ×3 IMPLANT
CLIP VESOCCLUDE SM WIDE 6/CT (CLIP) ×3 IMPLANT
COVER PROBE W GEL 5X96 (DRAPES) IMPLANT
COVER SURGICAL LIGHT HANDLE (MISCELLANEOUS) ×3 IMPLANT
DERMABOND ADVANCED (GAUZE/BANDAGES/DRESSINGS) ×1
DERMABOND ADVANCED .7 DNX12 (GAUZE/BANDAGES/DRESSINGS) ×2 IMPLANT
DRAPE C-ARM 42X72 X-RAY (DRAPES) ×1 IMPLANT
DRAPE CHEST BREAST 15X10 FENES (DRAPES) ×1 IMPLANT
ELECT REM PT RETURN 9FT ADLT (ELECTROSURGICAL) ×3
ELECTRODE REM PT RTRN 9FT ADLT (ELECTROSURGICAL) ×2 IMPLANT
GAUZE 4X4 16PLY ~~LOC~~+RFID DBL (SPONGE) ×3 IMPLANT
GLOVE SRG 8 PF TXTR STRL LF DI (GLOVE) ×2 IMPLANT
GLOVE SURG ENC MOIS LTX SZ7.5 (GLOVE) ×3 IMPLANT
GLOVE SURG POLY ORTHO LF SZ7.5 (GLOVE) IMPLANT
GLOVE SURG UNDER LTX SZ8 (GLOVE) ×3 IMPLANT
GLOVE SURG UNDER POLY LF SZ8 (GLOVE) ×3
GOWN STRL REUS W/ TWL LRG LVL3 (GOWN DISPOSABLE) ×7 IMPLANT
GOWN STRL REUS W/TWL LRG LVL3 (GOWN DISPOSABLE) ×12
KIT BASIN OR (CUSTOM PROCEDURE TRAY) ×3 IMPLANT
KIT PALINDROME-P 55CM (CATHETERS) IMPLANT
KIT TURNOVER KIT B (KITS) ×3 IMPLANT
NDL 18GX1X1/2 (RX/OR ONLY) (NEEDLE) ×1 IMPLANT
NDL HYPO 25GX1X1/2 BEV (NEEDLE) ×1 IMPLANT
NEEDLE 18GX1X1/2 (RX/OR ONLY) (NEEDLE) IMPLANT
NEEDLE HYPO 25GX1X1/2 BEV (NEEDLE) ×3 IMPLANT
NS IRRIG 1000ML POUR BTL (IV SOLUTION) ×3 IMPLANT
PACK CV ACCESS (CUSTOM PROCEDURE TRAY) ×3 IMPLANT
PACK SURGICAL SETUP 50X90 (CUSTOM PROCEDURE TRAY) ×3 IMPLANT
PAD ARMBOARD 7.5X6 YLW CONV (MISCELLANEOUS) ×6 IMPLANT
SLING ARM FOAM STRAP LRG (SOFTGOODS) IMPLANT
SLING ARM FOAM STRAP MED (SOFTGOODS) IMPLANT
SPONGE SURGIFOAM ABS GEL 100 (HEMOSTASIS) IMPLANT
SUT ETHILON 3 0 PS 1 (SUTURE) ×1 IMPLANT
SUT MNCRL AB 4-0 PS2 18 (SUTURE) ×3 IMPLANT
SUT PROLENE 5 0 C 1 24 (SUTURE) ×2 IMPLANT
SUT PROLENE 6 0 BV (SUTURE) ×3 IMPLANT
SUT VIC AB 3-0 SH 27 (SUTURE) ×3
SUT VIC AB 3-0 SH 27X BRD (SUTURE) ×2 IMPLANT
SYR 10ML LL (SYRINGE) ×3 IMPLANT
SYR 20ML LL LF (SYRINGE) ×6 IMPLANT
SYR 5ML LL (SYRINGE) ×6 IMPLANT
SYR CONTROL 10ML LL (SYRINGE) ×3 IMPLANT
TOWEL GREEN STERILE (TOWEL DISPOSABLE) ×6 IMPLANT
TOWEL GREEN STERILE FF (TOWEL DISPOSABLE) ×3 IMPLANT
UNDERPAD 30X36 HEAVY ABSORB (UNDERPADS AND DIAPERS) ×3 IMPLANT
WATER STERILE IRR 1000ML POUR (IV SOLUTION) ×3 IMPLANT

## 2021-07-24 NOTE — Progress Notes (Signed)
Pharmacy Antibiotic Note ? ?Cassandra Campos is a 51 y.o. female admitted on 07/23/2021 with suspected bacteremia.  Patient is ESRD MTuThFri presenting with soreness and drainage at fistula site. Patient reports fever of 102F at home but is afebrile inpatient. Pharmacy has been consulted for vancomycin dosing.  ?-plans for HD on 3/11 ? ? ?Plan: ?-Vancomycin '1000mg'$  IV post HD on 3/11 ?-Will follow for further HD plans ? ?Height: '5\' 3"'$  (160 cm) ?Weight: 97.4 kg (214 lb 11.7 oz) ?IBW/kg (Calculated) : 52.4 ? ?Temp (24hrs), Avg:98.2 ?F (36.8 ?C), Min:98.1 ?F (36.7 ?C), Max:98.5 ?F (36.9 ?C) ? ?Recent Labs  ?Lab 07/23/21 ?1526 07/24/21 ?1517  ?WBC 6.4 6.0  ?CREATININE 10.22* 11.16*  ? ?  ?Estimated Creatinine Clearance: 6.7 mL/min (A) (by C-G formula based on SCr of 11.16 mg/dL (H)).   ? ?No Known Allergies ? ?Antimicrobials this admission: ?Vancomycin 3/9 >>  ? ?Dose adjustments this admission: ?none ? ?Microbiology results: ?3/9 BCx: ngtd ? ?Thank you for allowing pharmacy to participate in this patient's care. ? ?Hildred Laser, PharmD ?Clinical Pharmacist ?**Pharmacist phone directory can now be found on [email or web address] ([email or web address]).  Listed under [place]. ? ? ?

## 2021-07-24 NOTE — Anesthesia Procedure Notes (Addendum)
Procedure Name: LMA Insertion ?Date/Time: 07/24/2021 10:43 AM ?Performed by: Myna Bright, CRNA ?Pre-anesthesia Checklist: Patient identified, Emergency Drugs available, Suction available and Patient being monitored ?Patient Re-evaluated:Patient Re-evaluated prior to induction ?Oxygen Delivery Method: Circle system utilized ?Preoxygenation: Pre-oxygenation with 100% oxygen ?Induction Type: IV induction ?Ventilation: Mask ventilation without difficulty ?LMA: LMA inserted ?LMA Size: 4.0 ?Number of attempts: 1 ?Placement Confirmation: positive ETCO2 and breath sounds checked- equal and bilateral ?Tube secured with: Tape ?Dental Injury: Teeth and Oropharynx as per pre-operative assessment  ? ? ? ? ?

## 2021-07-24 NOTE — Hospital Course (Addendum)
Cassandra Campos is a 51 y.o. female with medical history significant of ESRD on HD MTThursFri, insulin dependent Type 2 DM, HTN, CHF, HLD, OSA CPAP who presents with issues with her AV fistula.  ?  ?Pt does dialysis at home and 2 days ago noticed increase bloody drainage of an ulcer overlaying her fistula site. Noted a temperature of 100.76F.  She presented with the symptoms to dialysis center today and had wound and blood cultures drawn and was sent to the ED.  Cultures negative.  Fistula fixed. ?

## 2021-07-24 NOTE — Progress Notes (Signed)
NEW ADMISSION NOTE ?New Admission Note:  ? ?Arrival Method: stretcher ?Mental Orientation: A&OX4 ?Telemetry: 39m4 ?Assessment: Completed ?Skin: intact ?IV: RFA ?Pain: 5/10 ?Tubes: NONE ?Safety Measures: Safety Fall Prevention Plan has been given, discussed and signed ?Admission: Completed ?[facility] Orientation: Patient has been orientated to the room, unit and staff.  ?Family:None at bedside  ? ?Orders have been reviewed and implemented. Will continue to monitor the patient. Call light has been placed within reach and bed alarm has been activated.  ? ?Drayden Lukas S Oliviana Mcgahee, RN   ?

## 2021-07-24 NOTE — Anesthesia Preprocedure Evaluation (Signed)
Anesthesia Evaluation  Patient identified by MRN, date of birth, ID band Patient awake    Reviewed: Allergy & Precautions, NPO status , Patient's Chart, lab work & pertinent test results  History of Anesthesia Complications Negative for: history of anesthetic complications  Airway        Dental   Pulmonary shortness of breath, sleep apnea and Continuous Positive Airway Pressure Ventilation , neg COPD,           Cardiovascular hypertension, Pt. on medications and Pt. on home beta blockers (-) angina+CHF  (-) Past MI   ? Nuclear stress EF: 58%. ? The left ventricular ejection fraction is normal (55-65%). ? There was no ST segment deviation noted during stress. ? The study is normal. ? This is a low risk study.    Neuro/Psych  Neuromuscular disease negative psych ROS   GI/Hepatic Neg liver ROS, GERD  ,  Endo/Other  diabetes, Insulin DependentLab Results      Component                Value               Date                      HGBA1C                   6.3 (H)             02/02/2021             Renal/GU ESRF and DialysisRenal diseaseLab Results      Component                Value               Date                      K                        4.0                 07/24/2021            Lab Results      Component                Value               Date                      CREATININE               11.16 (H)           07/24/2021                Musculoskeletal  (+) Arthritis ,   Abdominal   Peds  Hematology  (+) Blood dyscrasia, anemia , Lab Results      Component                Value               Date                      WBC                      6.0  07/24/2021                HGB                      8.7 (L)             07/24/2021                HCT                      26.3 (L)            07/24/2021                MCV                      97.4                07/24/2021                PLT                       214                 07/24/2021              Anesthesia Other Findings   Reproductive/Obstetrics                             Anesthesia Physical Anesthesia Plan Anesthesia Quick Evaluation

## 2021-07-24 NOTE — ED Notes (Signed)
Noted that vascular consult note mentions pt to be NPO at midnight. Noted that pt doesn't have an order for NPO and has a regular diet. Reached out to [facility] DO for clarification. Will keep pt NPO until clarification obtained. ?

## 2021-07-24 NOTE — ED Notes (Signed)
Nephrologist at bedside

## 2021-07-24 NOTE — Op Note (Signed)
? ? ?  NAME: Cassandra Campos    MRN: [id number] ?DOB: 01-05-1971    DATE OF OPERATION: 07/24/2021 ? ?PREOP DIAGNOSIS:   ? ?Ulcer over left upper arm fistula ? ?POSTOP DIAGNOSIS:   ? ?Same ? ?PROCEDURE:  ?  ?Excision of ulcer overlying left upper arm fistula and suture repair of fistula ? ?SURGEON: Judeth Cornfield. Scot Dock, MD ? ?ASSIST: Delena Serve, RNFA ? ?ANESTHESIA: General ? ?EBL: Minimal ? ?INDICATIONS:  ? ? Janelis Stelzer Quilter is a 51 y.o. 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  She presented to the emergency department with a wound over her fistula that was felt to be at risk for bleeding.  She was scheduled for excision of this area and exploration. ? ?FINDINGS:  ? ?I mobilized the skin overlying the area of concern and excised the ulcer.  The fistula was repaired and the wound closed primarily ? ?TECHNIQUE:  ? ?Patient was taken to the operating room and received a general anesthetic.  The left arm was prepped and draped in usual sterile fashion.  There was an eschar overlying the fistula in the mid upper arm.  Using a 15 blade I made an elliptical incision over this area.  The assistant provided countertraction as I carefully dissected the skin away from the fistula.  The assistant also provided suction.  This was a buttonhole fistula and I therefore elected to repair the fistula primarily once I had mobilized this enough.  I closed the area of concern with a running 5-0 Prolene suture with the help of the assistant.  Hemostasis was obtained in the wound using electrocautery.  The wound was then closed with a deep layer of 3-0 Vicryl and the skin closed with 4-0 Monocryl.  Dermabond was applied.  The patient tolerated the procedure well was transferred to recovery room in stable condition.  All needle and sponge counts were correct. ? ? ?Deitra Mayo,  MD, FACS ?[facility] ? ?DATE OF DICTATION:   07/24/2021 ? ?

## 2021-07-24 NOTE — Progress Notes (Signed)
PROGRESS NOTE    Cassandra Campos  HOZ:[id number] DOB: [birth year] DOA: 07/23/2021 PCP: Glendale Chard, MD    Brief Narrative:  Cassandra Campos is a 51 y.o. female with medical history significant of ESRD on HD MTThursFri, insulin dependent Type 2 DM, HTN, CHF, HLD, OSA CPAP who presents with issues with her AV fistula.    Pt does dialysis at home and 2 days ago noticed increase bloody drainage of an ulcer overlaying her fistula site. Noted a temperature of 100.65F.  She presented with the symptoms to dialysis center today and had wound and blood cultures drawn and was sent to the ED.    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 Jonnie Finner-- would like her to get HD her prior to d/c and will probably be in the AM before HD happens -? Infection--- continue IV vancomycin and  IV Rocephin - until cultures back - Wound culture was taken at dialysis site today   Hypotension -resolved  ESRD on hemodialysis ([place]) ESRD HD M/Tues/Thurs/Friday -getting HD in AM per renal -? D/c after    OSA (obstructive sleep apnea) Continue CPAP  Anemia of chronic kidney failure, stage 5 (HCC) -Hgb of 8.9 which has downward trended from 11.2 -appreciate nephrology:  Epo with dialysis   Type 2 diabetes mellitus with chronic kidney disease on chronic dialysis, with long-term current use of insulin ([place]) -resume home regimen          DVT prophylaxis: SCDs Start: 07/23/21 2125    Code Status: Full Code Family Communication:   Disposition Plan:  Level of care: Telemetry Medical Status is: Inpatient Remains inpatient appropriate because: needs HD prior to d/c    Consultants:   Renal vascular   Subjective: No complaints except hunger  Objective: Vitals:   07/24/21 1135 07/24/21 1136 07/24/21 1150 07/24/21 1205  BP: (!) 118/95  103/69 104/76  Pulse: 80 79 75 76  Resp: '14 12 14 15  '$ Temp: 98.1 F (36.7 C)     TempSrc:      SpO2: 99% 100% 96% 95%  Weight:      Height:        Intake/Output Summary (Last 24 hours) at 07/24/2021 1303 Last data filed at 07/24/2021 1136 Gross per 24 hour  Intake 303.04 ml  Output 25 ml  Net 278.04 ml   Filed Weights   07/23/21 2157  Weight: 97.4 kg    Examination:   General: Appearance:    Obese female in no acute distress     Lungs:     Clear to auscultation bilaterally, respirations unlabored  Heart:    Normal heart rate. Normal rhythm. No murmurs, rubs, or gallops.    MS:   All extremities are intact.    Neurologic:   Awake, alert, oriented x 3. No apparent focal neurological           defect.        Data Reviewed: I have personally reviewed following labs and imaging studies  CBC: Recent Labs  Lab 07/23/21 1526 07/24/21 0346  WBC 6.4 6.0  NEUTROABS 3.9  --   HGB 8.9* 8.7*  HCT 27.3* 26.3*  MCV 97.8 97.4  PLT 230 704   Basic Metabolic Panel: Recent Labs  Lab 07/23/21 1526 07/24/21 0346  NA 137 136  K 3.5 4.0  CL 93* 92*  CO2 30 28  GLUCOSE 200* 156*  BUN 35* 47*  CREATININE 10.22* 11.16*  CALCIUM 9.1 8.8*   GFR: Estimated Creatinine Clearance: 6.7 mL/min (A) (by C-G formula based on SCr of 11.16 mg/dL (H)). Liver Function Tests: No results for input(s): AST, ALT, ALKPHOS, BILITOT, PROT, ALBUMIN in the last 168 hours. No results for input(s): LIPASE, AMYLASE in the last 168 hours. No results for input(s): AMMONIA in the last 168 hours. Coagulation Profile: No results for input(s): INR, PROTIME in the last 168 hours. Cardiac Enzymes: No results for input(s): CKTOTAL, CKMB, CKMBINDEX, TROPONINI in the last 168 hours. BNP (last 3 results) No results for input(s): PROBNP in the  last 8760 hours. HbA1C: No results for input(s): HGBA1C in the last 72 hours. CBG: Recent Labs  Lab 07/24/21 0439 07/24/21 0743 07/24/21 0931 07/24/21 1144  GLUCAP 154* 172* 148* 116*   Lipid Profile: No results for input(s): CHOL, HDL, LDLCALC, TRIG, CHOLHDL, LDLDIRECT in the last 72 hours. Thyroid Function Tests: No results for input(s): TSH, T4TOTAL, FREET4, T3FREE, THYROIDAB in the last 72 hours. Anemia Panel: No results for input(s): VITAMINB12, FOLATE, FERRITIN, TIBC, IRON, RETICCTPCT in the last 72 hours. Sepsis Labs: No results for input(s): PROCALCITON, LATICACIDVEN in the last 168 hours.  Recent Results (from the past 240 hour(s))  Resp Panel by RT-PCR (Flu A&B, Covid) Nasopharyngeal Swab     Status: None   Collection Time: 07/23/21  9:56 PM   Specimen: Nasopharyngeal Swab; Nasopharyngeal(NP) swabs in vial transport medium  Result Value Ref Range Status   SARS Coronavirus 2 by RT PCR NEGATIVE NEGATIVE Final    Comment: (NOTE) SARS-CoV-2 target nucleic acids are NOT DETECTED.  The SARS-CoV-2 RNA is generally detectable in upper respiratory specimens during the acute phase of infection. The lowest concentration of SARS-CoV-2 viral copies this assay can detect is 138 copies/mL. A negative result does not preclude SARS-Cov-2 infection and should not be used as the sole basis for treatment or other patient management decisions. A negative result may occur with  improper specimen collection/handling, submission of specimen other than nasopharyngeal swab, presence of viral mutation(s) within the areas targeted by this assay, and inadequate number of viral copies(<138 copies/mL). A negative result must be combined with clinical observations, patient history, and epidemiological information. The expected result is Negative.  Fact Sheet for Patients:  EntrepreneurPulse.com.au  Fact Sheet for Healthcare Providers:   IncredibleEmployment.be  This test is no t yet approved or cleared by the [place] FDA and  has been authorized for detection and/or diagnosis of SARS-CoV-2 by FDA under an Emergency Use Authorization (EUA). This EUA will remain  in effect (meaning this test can be used) for the duration of the COVID-19 declaration under Section 564(b)(1) of the Act, 21 U.S.C.section 360bbb-3(b)(1), unless the authorization is terminated  or revoked sooner.       Influenza A by PCR NEGATIVE NEGATIVE Final   Influenza B by PCR NEGATIVE NEGATIVE Final    Comment: (NOTE) The Xpert Xpress SARS-CoV-2/FLU/RSV plus assay is intended as an aid in the diagnosis of influenza from Nasopharyngeal swab specimens and should not be used as a sole basis for treatment. Nasal washings and aspirates are unacceptable for Xpert Xpress SARS-CoV-2/FLU/RSV testing.  Fact Sheet for Patients: EntrepreneurPulse.com.au  Fact Sheet for Healthcare Providers: IncredibleEmployment.be  This test is not yet approved or cleared by the [place] and  has been authorized for detection and/or diagnosis of SARS-CoV-2 by FDA under an Emergency Use Authorization (EUA). This EUA will remain in effect (meaning this test can be used) for the duration of the COVID-19 declaration under Section 564(b)(1) of the Act, 21 U.S.C. section 360bbb-3(b)(1), unless the authorization is terminated or revoked.  Performed at [facility], [place] [address] [place], [place] [address]          Radiology Studies: UE VENOUS DUPLEX (7am - 7pm)  Result Date: 07/23/2021 UPPER VENOUS STUDY  Patient Name:  Cassandra Campos  Date of Exam:   07/23/2021 Medical Rec #: [id number]                 Accession #:    [id number] Date of Birth: 1970-10-07                 Patient Gender: F Patient Age:   4 years Exam Location:  [facility] Procedure:      VAS [place] UPPER EXTREMITY VENOUS  DUPLEX Referring Phys: Theodis Blaze --------------------------------------------------------------------------------  Indications: Pain Other Indications: Lt AV fistula. Comparison Study: 02/10/17 prior Performing Technologist: Archie Patten RVS  Examination Guidelines: A complete evaluation includes B-mode imaging, spectral Doppler, color Doppler, and power Doppler as needed of all accessible portions of each vessel. Bilateral testing is considered an integral part of a complete examination. Limited examinations for reoccurring indications may be performed as noted.  Right Findings: +----------+------------+---------+-----------+----------+-------+  RIGHT      Compressible Phasicity Spontaneous Properties Summary  +----------+------------+---------+-----------+----------+-------+  Subclavian                 Yes        Yes                         +----------+------------+---------+-----------+----------+-------+  Left Findings: +----------+------------+---------+-----------+----------+-------+  LEFT       Compressible Phasicity Spontaneous Properties Summary  +----------+------------+---------+-----------+----------+-------+  IJV            Full        Yes        Yes                         +----------+------------+---------+-----------+----------+-------+  Subclavian     Full        Yes        Yes                         +----------+------------+---------+-----------+----------+-------+  Axillary       Full        Yes        Yes                         +----------+------------+---------+-----------+----------+-------+  Brachial       Full        Yes        Yes                         +----------+------------+---------+-----------+----------+-------+  Radial         Full                                               +----------+------------+---------+-----------+----------+-------+  Ulnar  Full                                               +----------+------------+---------+-----------+----------+-------+   Cephalic       Full                                               +----------+------------+---------+-----------+----------+-------+  Basilic        Full                                               +----------+------------+---------+-----------+----------+-------+  Summary:  Right: No evidence of thrombosis in the subclavian.  Left: No evidence of deep vein thrombosis in the upper extremity. No evidence of superficial vein thrombosis in the upper extremity. Fisula appears patent with no obvious evidence of stenosis.  *See table(s) above for measurements and observations.  Diagnosing physician: Jamelle Haring Electronically signed by Jamelle Haring on 07/23/2021 at 8:58:00 PM.    Final         Scheduled Meds:  [MAR Hold] allopurinol  300 mg Oral Daily   [MAR Hold] cinacalcet  60 mg Oral Q supper   [MAR Hold] colchicine  0.6 mg Oral Daily   [MAR Hold] estradiol  0.5 mg Oral Daily   [MAR Hold] insulin aspart  0-6 Units Subcutaneous Q4H   [MAR Hold] insulin regular human CONCENTRATED  120 Units Subcutaneous Q breakfast   And   [MAR Hold] insulin regular human CONCENTRATED  150 Units Subcutaneous Q supper   [MAR Hold] lanthanum  1,000 mg Oral TID WC   [MAR Hold] metoprolol succinate  100 mg Oral Daily   [MAR Hold] multivitamin  1 tablet Oral QHS   [MAR Hold] pregabalin  150 mg Oral BID   [MAR Hold] rosuvastatin  20 mg Oral Daily   [MAR Hold] vancomycin variable dose per unstable renal function (pharmacist dosing)   Does not apply See admin instructions   Continuous Infusions:  sodium chloride     acetaminophen     [MAR Hold] cefTRIAXone (ROCEPHIN)  IV Stopped (07/23/21 2308)     LOS: 1 day    Time spent: 75 minutes spent on chart review, discussion with nursing staff, consultants, updating family and interview/physical exam; more than 50% of that time was spent in counseling and/or coordination of care.    Geradine Girt, DO [facility] Available via Epic secure chat  7am-7pm After these hours, please refer to coverage provider listed on [email or web address] 07/24/2021, 1:03 PM

## 2021-07-24 NOTE — Progress Notes (Signed)
Pt states she does not want to use cpap tonight. ?

## 2021-07-24 NOTE — Transfer of Care (Signed)
Immediate Anesthesia Transfer of Care Note ? ?Patient: [facility] ? ?Procedure(s) Performed: REVISON OF ARTERIOVENOUS FISTULA ARM (Left) ? ?Patient Location: PACU ? ?Anesthesia Type:General ? ?Level of Consciousness: awake, alert , oriented and patient cooperative ? ?Airway & Oxygen Therapy: Patient Spontanous Breathing ? ?Post-op Assessment: Report given to RN, Post -op Vital signs reviewed and stable and Patient moving all extremities ? ?Post vital signs: Reviewed and stable ? ?Last Vitals:  ?Vitals Value Taken Time  ?BP 118/95 07/24/21 1135  ?Temp    ?Pulse 80 07/24/21 1135  ?Resp 14 07/24/21 1135  ?SpO2 99 % 07/24/21 1135  ?Vitals shown include unvalidated device data. ? ?Last Pain:  ?Vitals:  ? 07/24/21 0934  ?TempSrc: Oral  ?PainSc:   ?   ? ?  ? ?Complications: No notable events documented. ?

## 2021-07-24 NOTE — ED Notes (Signed)
Pt removing clothes and jewelry and placing in belongings bags. All belongings transported w/ pt on stretcher to short stay. ?

## 2021-07-24 NOTE — Progress Notes (Signed)
Messaged MD on call to see if CBG can be ACHS instead of Q 4. Pt is no longer NPO. Awaiting response. ? ?Foster Simpson Cody Albus ? ?

## 2021-07-24 NOTE — ED Notes (Signed)
Called pharmacy regarding insulin pen and the instructions saying to give w/ breakfast and pt is NPO. Pharmacist is going to reach out to hospitalist and reach back out to this RN. ?

## 2021-07-24 NOTE — Consult Note (Signed)
Renal Service Consult Note [facility]  [place] 07/24/2021 Sol Blazing, MD Requesting Physician: Dr. Royal Piedra  Reason for Consult: AVF infection, ESRD pt HPI: The patient is a 51 y.o. year-old w/ hx of anemia ckd, ESRD on HD, HL, OSA, neuropathy, DM on insulin sent to ED from [facility] office for evaluation of possibly AVF infection in L upper arm.  In ED VS stable, +large ulcer overlying her AVF, temp 100.2. Seen by vasc surgery and taken to OR today where they did an excision of ulcer overlying the AVF.  Blood cx's sent and are negative today.   Pt seen in ED. Does home HD, on dialysis since 2019.  Lives w/ her husband in [place].  Hd on MTuThFri, about 3h 68mn. Last home HD was on Tuesday 3/07.      ROS - denies CP, no joint pain, no HA, no blurry vision, no rash, no diarrhea, no nausea/ vomiting, no dysuria, no difficulty voiding   Past Medical History  Past Medical History:  Diagnosis Date   Anemia associated with chronic renal failure    ASCUS of cervix with negative high risk HPV 06/2017   Back pain    Chest pain    CHF (congestive heart failure) [place]    CKD (chronic kidney disease), stage IV ([place] yet   nephrologist-  dr cJamal Maes- scheduled next visit 09-08-2017 (lov 07-05-2017)   Constipation    Diabetic retinopathy [place]    [place]    Edema, lower extremity    Elevated transaminase level    Family history of breast cancer    Family history of ovarian cancer    Family history of stomach cancer    Fatty liver    FOLLOWED BY DR PHenrene Pastor  GERD (gastroesophageal reflux disease)    Hypertension    Idiopathic gout of multiple sites    09-05-2017 per pt stable   Insulin dependent type 2 diabetes mellitus ([place]    followed by dr kToy Care  Joint pain    Mixed hyperlipidemia    OSA (obstructive sleep apnea)    per study 10-20-2015 mild osa w/ AHI 10.3/hr;  09-05-2017 per pt has not used cpap since 1 yr ago    Palpitations    Peripheral neuropathy    feet   Pure hypercholesterolemia 05/21/2021   S/P arteriovenous (AV) fistula creation 07-04-2015  w/ revision 02-14-2017   left braniocephilic   Shortness of breath    Trigger thumb, right thumb    Umbilical hernia    Vitamin D deficiency    Past Surgical History  Past Surgical History:  Procedure Laterality Date   A/V FISTULAGRAM N/A 04/01/2021   Procedure: A/V FISTULAGRAM;  Surgeon: RBroadus John MD;  [facility];  Service: Cardiovascular;  Laterality: N/A;   ABDOMINAL HYSTERECTOMY  07/11/2000   dr gott   TAH/BSO for irregular bleeding and leiomyoma   AV FISTULA PLACEMENT Left 07/04/2015   Procedure: ARTERIOVENOUS (AV) FISTULA CREATION-LEFT;  Surgeon: JMal Misty MD;  [place]  Service: Vascular;  Laterality: Left;   BREAST EXCISIONAL BIOPSY Right    benign   CARPAL TUNNEL RELEASE Bilateral 1993 and [place]  09-25-1999   BILATERAL TUBAL LIGATION W/ LAST C/S   DILATION AND CURETTAGE OF UTERUS     EXPLORATORY LAPARTOMY / LYSIS ADHESIONS/  BILATERAL SALPINGOOPHORECTOMY  07-20-2011  [facility]  FISTULA SUPERFICIALIZATION  Left 08/28/2015   Procedure: BRACHIOCEPHALIC FISTULA SUPERFICIALIZATION;  Surgeon: Mal Misty, MD;  Location: [place];  Service: Vascular;  Laterality: Left;   PATCH ANGIOPLASTY Left 02/14/2017   Procedure: PATCH ANGIOPLASTY;  Surgeon: Elam Dutch, MD;  Location: [place];  Service: Vascular;  Laterality: Left;   PELVIC LAPAROSCOPY  1994   exploration for ectopic preg.   PERIPHERAL VASCULAR BALLOON ANGIOPLASTY  04/01/2021   Procedure: PERIPHERAL VASCULAR BALLOON ANGIOPLASTY;  Surgeon: [name], MD;  Location: [place] [facility];  Service: Cardiovascular;;   RETINAL DETACHMENT SURGERY Left 2015   REVISON OF ARTERIOVENOUS FISTULA Left 02/14/2017   Procedure: REVISON OF ARTERIOVENOUS FISTULA  LEFT ARM;  Surgeon: Elam Dutch, MD;  Location: [place];   Service: Vascular;  Laterality: Left;   TRIGGER FINGER RELEASE Left 2015   thumb   TRIGGER FINGER RELEASE Right 09/09/2017   Procedure: RELEASE TRIGGER FINGER/A-1 PULLEY RIGHT THUMB;  Surgeon: Dorna Leitz, MD;  Location: [place];  Service: Orthopedics;  Laterality: Right;   Family History  Family History  Problem Relation Age of Onset   Diabetes Mother    Hypertension Mother    Thyroid disease Mother    Ovarian cancer Mother        dx 39s   Diabetes Father    Hypertension Father    Cancer Father        STOMACH   Stomach cancer Father 11   Stroke Maternal Grandmother    Stroke Maternal Grandfather    Breast cancer Maternal Aunt 66   Cancer Paternal Grandfather        unknown type, mets to brain   Cancer Other        unknown type, father's first cousin   Cancer Paternal Uncle        unknown type, dx >50   Cancer Cousin        unknown type, dx 9s, paternal first cousin   Cancer Other        unknown types, 4 of mother's first cousins   Colon cancer Neg Hx    Rectal cancer Neg Hx    Social History  reports that she has never smoked. She has never used smokeless tobacco. She reports current alcohol use. She reports that she does not use drugs. Allergies No Known Allergies Home medications Prior to Admission medications   Medication Sig Start Date End Date Taking? Authorizing Provider  albuterol (VENTOLIN HFA) 108 (90 Base) MCG/ACT inhaler Inhale 2 puffs into the lungs every 6 (six) hours as needed for wheezing or shortness of breath. 07/13/21  Yes Minette Brine, FNP  allopurinol (ZYLOPRIM) 300 MG tablet TAKE ONE TABLET BY MOUTH EVERY MORNING Patient taking differently: Take 300 mg by mouth daily. 05/18/21  Yes Glendale Chard, MD  B Complex-C-Folic Acid (RENAL) 1 MG CAPS Take 1 capsule by mouth at bedtime. 09/18/18  Yes [provider]  cinacalcet (SENSIPAR) 60 MG tablet Take 60 mg by mouth at bedtime.   Yes [provider]  estradiol (ESTRACE)  0.5 MG tablet Take 1 tablet (0.5 mg total) by mouth daily. 06/04/21  Yes Marny Lowenstein A, NP  guaiFENesin 200 MG tablet Take 1 tablet (200 mg total) by mouth every 4 (four) hours as needed for cough or to loosen phlegm. 07/13/21  Yes Minette Brine, FNP  insulin regular human CONCENTRATED (HUMULIN R U-500 KWIKPEN) 500 UNIT/ML kwikpen Inject 200 units before breakfast 160-170 units before dinner Patient taking differently: Inject 140-200 Units  into the skin See admin instructions. Inject 200 units subcutaneously before breakfast & inject 140-180 units subcutaneously before dinner 08/18/20  Yes Philemon Kingdom, MD  lanthanum (FOSRENOL) 1000 MG chewable tablet Chew 1,000 mg by mouth 3 (three) times daily with meals.   Yes [provider]  metoprolol succinate (TOPROL-XL) 100 MG 24 hr tablet Take 1 tablet (100 mg total) by mouth daily. Take with or immediately following a meal. 04/20/21 07/23/21 Yes Skeet Latch, MD  MITIGARE 0.6 MG CAPS Monday, Wednesday, and Friday 10/30/20  Yes [place]endale Chard, MD  oxyCODONE (ROXICODONE) 5 MG immediate release tablet Take 1 tablet (5 mg total) by mouth every 4 (four) hours as needed. 07/24/21  Yes Angelia Mould, MD  pregabalin (LYRICA) 150 MG capsule Take 1 capsule (150 mg total) by mouth 2 (two) times daily. 05/19/21 05/19/22 Yes Glendale Chard, MD  rosuvastatin (CRESTOR) 20 MG tablet Take 1 tablet (20 mg total) by mouth daily. 04/20/21  Yes Skeet Latch, MD  Semaglutide, 2 MG/DOSE, (OZEMPIC, 2 MG/DOSE,) 8 MG/3ML SOPN Inject 2 mg into the skin once a week. 04/13/21  Yes Philemon Kingdom, MD  Continuous Blood Gluc Receiver (FREESTYLE LIBRE 2 READER) DEVI 1 each by Does not apply route daily. 07/30/20   Philemon Kingdom, MD  Continuous Blood Gluc Sensor (FREESTYLE LIBRE 2 SENSOR) MISC 1 each by Does not apply route every 14 (fourteen) days. 07/30/20   Philemon Kingdom, MD  Insulin Pen Needle 32G X 4 MM MISC Use 2x a day 06/04/21   Philemon Kingdom, MD   lidocaine-prilocaine (EMLA) cream USE ON ARAMS AS NEEDED FOR DIALYSIS 03/27/18   [provider]  Microlet Lancets MISC Use as directed to check blood sugars 4 times per day dx: e11.22 12/04/18   Glendale Chard, MD     Vitals:   07/24/21 1320 07/24/21 1350 07/24/21 1420 07/24/21 1450  BP: 113/75 116/79 123/78 114/72  Pulse: 78 75 77 79  Resp: (!) '21 13 15 16  '$ Temp:      TempSrc:      SpO2: 97% 97% 97% 96%  Weight:      Height:       Exam Gen alert, no distress No rash, cyanosis or gangrene Sclera anicteric, throat clear  No jvd or bruits Chest clear bilat to bases, no rales/ wheezing RRR no MRG Abd soft ntnd no mass or ascites +bs obese GU defer MS no joint effusions or deformity Ext L upper arm wrapped this am, no LE edema Neuro is alert, Ox 3 , nf      Home meds include - zyloprim, sensipar 60 hs, insulin regular, fosrenol 1gm ac tid, toprol xl 100, lyrica 150 bid, crestor, semaglutide '2mg'$  sq weekly, prns/ vits/ supps     OP HD: home HD MTuThFri   3h 20 min   LUA AVF   Heparin 10000 x 1   98kg    Assessment/ Plan: Ulcerated L AVF - sp revision today by Dr Scot Dock Low grade temps - no signs of infection per OP note and blood cx's are negative.  Getting empiric abx here.  ESRD - on home HD, 4 days per week. Plan HD tomorrow am, stick above and below the surgical area. If AVF works well anticipate will dc home after HD.  Anemia ckd - Hb 8-9 range, follow MBD ckd - cont sensipar, binder HTN - cont meds  DM - per pmd      Kelly Splinter  MD 07/24/2021, 4:04 PM Recent Labs  Lab 07/23/21 1526 07/24/21 0346  HGB 8.9* 8.7*  CALCIUM 9.1 8.8*  CREATININE 10.22* 11.16*  K 3.5 4.0

## 2021-07-24 NOTE — Interval H&P Note (Signed)
History and Physical Interval Note: ? ?07/24/2021 ?10:12 AM ? ?[facility]  has presented today for surgery, with the diagnosis of ESRD.  The various methods of treatment have been discussed with the patient and family. After consideration of risks, benefits and other options for treatment, the patient has consented to  Procedure(s): ?REVISON OF ARTERIOVENOUS FISTULA ARM (Left) ?INSERTION OF TUNNELED  DIALYSIS CATHETER (N/A) as a surgical intervention.  The patient's history has been reviewed, patient examined, no change in status, stable for surgery.  I have reviewed the patient's chart and labs.  Questions were answered to the patient's satisfaction.   ? ? ?Deitra Mayo ? ? ?

## 2021-07-25 LAB — HEPATITIS B SURFACE ANTIGEN
Hepatitis B Surface Ag: NONREACTIVE
Hepatitis B Surface Ag: NONREACTIVE

## 2021-07-25 LAB — GLUCOSE, CAPILLARY
Glucose-Capillary: 197 mg/dL — ABNORMAL HIGH (ref 70–99)
Glucose-Capillary: 98 mg/dL (ref 70–99)

## 2021-07-25 LAB — HEPATITIS B SURFACE ANTIBODY,QUALITATIVE: Hep B S Ab: REACTIVE — AB

## 2021-07-25 MED ORDER — HYDROCORTISONE 1 % EX CREA
TOPICAL_CREAM | CUTANEOUS | Status: DC | PRN
  Filled 2021-07-25: qty 28

## 2021-07-25 NOTE — Progress Notes (Addendum)
RN reviewed AVS and medications with patient via teach-back.  Patient verbalized understanding and had no additional questions.  IV removed without incidence.  Patient dressed and found to have shoes missing (tan loafers with yellow buckle).  Patient has not worn them since ED.  Nurse secretary contacted ED to see if missing shoes are there but they were unable to locate shoes in ED.  Patient notified by primary RN.   ?

## 2021-07-25 NOTE — Progress Notes (Signed)
[facility] ?Progress Note ? ?Subjective: seen in HD, no issues sticking the AVF post op ? ?Vitals:  ? 07/25/21 1130 07/25/21 1200 07/25/21 1240 07/25/21 1257  ?BP: 127/71 129/60 119/61 100/62  ?Pulse: 72 76 76 80  ?Resp:   15 16  ?Temp:   98.6 ?F (37 ?C) 97.9 ?F (36.6 ?C)  ?TempSrc:   Oral Oral  ?SpO2:   100% 99%  ?Weight:   99 kg   ?Height:      ? ? ?Exam: ?Gen alert, no distress ?No jvd or bruits ?Chest clear bilat to bases ?RRR no MRG ?Abd soft ntnd no mass or ascites +bs obese ?Ext L upper arm mild edema, no LE edema ?Neuro is alert, Ox 3 , nf ? LUA AVF +bruit ?  ?  ?  ?  ? Home meds include - zyloprim, sensipar 60 hs, insulin regular, fosrenol 1gm ac tid, toprol xl 100, lyrica 150 bid, crestor, semaglutide '2mg'$  sq weekly, prns/ vits/ supps ?  ?  ?  ? OP HD: home HD MTuThFri ?  3h 20 min   LUA AVF   Heparin 10000   98kg  ?  ?  ?Assessment/ Plan: ?Ulcerated L AVF - sp revision today by Dr Scot Dock. No issues sticking the AVF today in HD.  ?Low grade temps - no signs of infection per OP note and blood cx's are negative. Should not need further abx , can dc.  ?ESRD - on home HD, 4 days per week. HD today off schedule. AVF working well. OK for dc home after HD today.  ?Anemia ckd - Hb 8-9 range, follow ?MBD ckd - cont sensipar, binder ?HTN - cont meds  ?DM - per pmd ?  ?  ? ? ? ? ?Rob Doctor, hospital ?07/25/2021, 1:46 PM ? ? ?Recent Labs  ?Lab 07/23/21 ?1526 07/24/21 ?8329  ?HGB 8.9* 8.7*  ?CALCIUM 9.1 8.8*  ?CREATININE 10.22* 11.16*  ?K 3.5 4.0  ? ?Inpatient medications: ? allopurinol  300 mg Oral Daily  ? Chlorhexidine Gluconate Cloth  6 each Topical Q0600  ? cinacalcet  60 mg Oral Q supper  ? colchicine  0.6 mg Oral Daily  ? estradiol  0.5 mg Oral Daily  ? insulin aspart  0-6 Units Subcutaneous TID AC & HS  ? insulin regular human CONCENTRATED  120 Units Subcutaneous Q breakfast  ? And  ? insulin regular human CONCENTRATED  150 Units Subcutaneous Q supper  ? lanthanum  1,000 mg Oral TID WC  ? metoprolol  succinate  100 mg Oral Daily  ? multivitamin  1 tablet Oral QHS  ? pregabalin  150 mg Oral BID  ? rosuvastatin  20 mg Oral Daily  ? ? ?hydrocortisone cream ? ? ? ? ? ? ? ?

## 2021-07-25 NOTE — Discharge Summary (Signed)
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 female with medical history significant of ESRD on HD MTThursFri, insulin dependent Type 2 DM, HTN, CHF, HLD, OSA CPAP who presents with issues with her AV fistula.    Pt does dialysis at home and 2 days ago noticed increase bloody drainage of an ulcer overlaying her fistula site. Noted a temperature of 100.5F.  She presented with the symptoms to dialysis center today and had wound and blood cultures drawn and was sent to the ED.  Cultures negative.  Fistula fixed.  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 Jonnie Finner-- would like her to get HD her prior to d/c and will probably be in the AM -culture negative -doubt infection- d/c abx   Hypotension -resolved  ESRD on hemodialysis ([place]) ESRD HD M/Tues/Thurs/Friday -getting HD in AM per renal-- discharge after   OSA (obstructive sleep apnea) Continue CPAP  Anemia of chronic kidney failure, stage 5 (HCC) -Hgb of 8.9 which has downward trended from 11.2 -appreciate  nephrology:  Epo with dialysis   Type 2 diabetes mellitus with chronic kidney disease on chronic dialysis, with long-term current use of insulin ([place]) -resume home regimen       Consultants: vascular renal  Disposition: Home Diet recommendation:  Renal/carb mod DISCHARGE MEDICATION: Allergies as of 07/25/2021   No Known Allergies      Medication List     TAKE these medications    albuterol 108 (90 Base) MCG/ACT inhaler Commonly known as: VENTOLIN HFA Inhale 2 puffs into the lungs every 6 (six) hours as needed for wheezing or shortness of breath.   allopurinol 300 MG tablet Commonly known as: ZYLOPRIM TAKE ONE TABLET BY MOUTH EVERY MORNING What changed: when to take this   cinacalcet 60 MG tablet Commonly known as: SENSIPAR Take 60 mg by mouth at bedtime.   estradiol 0.5 MG tablet Commonly known as: ESTRACE Take 1 tablet (0.5 mg total) by mouth daily.   FreeStyle Libre 2 Reader [address] 1 each by Does not apply route daily.   FreeStyle Libre 2 Sensor Misc 1 each by Does not apply route every 14 (fourteen) days.   guaiFENesin 200 MG tablet Take 1 tablet (200 mg total) by mouth every 4 (four) hours as needed for cough or to loosen phlegm.   HumuLIN R U-500 KwikPen 500 UNIT/ML KwikPen Generic drug: insulin regular human CONCENTRATED Inject 200 units before breakfast 160-170 units before dinner What changed:  how much to take how to take this when to take this additional instructions   Insulin Pen Needle 32G X 4 MM Misc  Use 2x a day   lanthanum 1000 MG chewable tablet Commonly known as: FOSRENOL Chew 1,000 mg by mouth 3 (three) times daily with meals.   lidocaine-prilocaine cream Commonly known as: EMLA USE ON ARAMS AS NEEDED FOR DIALYSIS   metoprolol succinate 100 MG 24 hr tablet Commonly known as: TOPROL-XL Take 1 tablet (100 mg total) by mouth daily. Take with or immediately following a meal.   Microlet Lancets Misc Use as directed to check  blood sugars 4 times per day dx: e11.22   Mitigare 0.6 MG Caps Generic drug: Colchicine Monday, Wednesday, and Friday   oxyCODONE 5 MG immediate release tablet Commonly known as: Roxicodone Take 1 tablet (5 mg total) by mouth every 4 (four) hours as needed.   Ozempic (2 MG/DOSE) 8 MG/3ML Sopn Generic drug: Semaglutide (2 MG/DOSE) Inject 2 mg into the skin once a week.   pregabalin 150 MG capsule Commonly known as: Lyrica Take 1 capsule (150 mg total) by mouth 2 (two) times daily.   Renal 1 MG Caps Take 1 capsule by mouth at bedtime.   rosuvastatin 20 MG tablet Commonly known as: CRESTOR Take 1 tablet (20 mg total) by mouth daily.        Follow-up Information     Glendale Chard, MD Follow up in 1 week(s).   Specialty: Internal Medicine Contact information: [address] [place] [address] [phone]         Lorretta Harp, MD .   Specialties: Cardiology, Radiology Contact information: [address] [place] [address] [phone]                Discharge Exam: Danley Danker Weights   07/23/21 2157 07/25/21 0851 07/25/21 1240  Weight: 97.4 kg 101 kg 99 kg     Condition at discharge: good  The results of significant diagnostics from this hospitalization (including imaging, microbiology, ancillary and laboratory) are listed below for reference.   Imaging Studies: UE VENOUS DUPLEX (7am - 7pm)  Result Date: 07/23/2021 UPPER VENOUS STUDY  Patient Name:  Cassandra Campos  Date of Exam:   07/23/2021 Medical Rec #: [id number]                 Accession #:    [id number] Date of Birth: 11/17/1970                 Patient Gender: F Patient Age:   50 years Exam Location:  [facility] Procedure:      VAS [place] UPPER EXTREMITY VENOUS DUPLEX Referring Phys: Theodis Blaze --------------------------------------------------------------------------------  Indications: Pain Other Indications: Lt AV fistula. Comparison Study: 02/10/17  prior Performing Technologist: Archie Patten RVS  Examination Guidelines: A complete evaluation includes B-mode imaging, spectral Doppler, color Doppler, and power Doppler as needed of all accessible portions of each vessel. Bilateral testing is considered an integral part of a complete examination. Limited examinations for reoccurring indications may be performed as noted.  Right Findings: +----------+------------+---------+-----------+----------+-------+  RIGHT      Compressible Phasicity Spontaneous Properties Summary  +----------+------------+---------+-----------+----------+-------+  Subclavian                 Yes        Yes                         +----------+------------+---------+-----------+----------+-------+  Left Findings: +----------+------------+---------+-----------+----------+-------+  LEFT       Compressible Phasicity Spontaneous Properties Summary  +----------+------------+---------+-----------+----------+-------+  IJV  Full        Yes        Yes                         +----------+------------+---------+-----------+----------+-------+  Subclavian     Full        Yes        Yes                         +----------+------------+---------+-----------+----------+-------+  Axillary       Full        Yes        Yes                         +----------+------------+---------+-----------+----------+-------+  Brachial       Full        Yes        Yes                         +----------+------------+---------+-----------+----------+-------+  Radial         Full                                               +----------+------------+---------+-----------+----------+-------+  Ulnar          Full                                               +----------+------------+---------+-----------+----------+-------+  Cephalic       Full                                               +----------+------------+---------+-----------+----------+-------+  Basilic        Full                                                +----------+------------+---------+-----------+----------+-------+  Summary:  Right: No evidence of thrombosis in the subclavian.  Left: No evidence of deep vein thrombosis in the upper extremity. No evidence of superficial vein thrombosis in the upper extremity. Fisula appears patent with no obvious evidence of stenosis.  *See table(s) above for measurements and observations.  Diagnosing physician: Jamelle Haring Electronically signed by Jamelle Haring on 07/23/2021 at 8:58:00 PM.    Final     Microbiology: Results for orders placed or performed during the hospital encounter of 07/23/21  Culture, blood (routine x 2)     Status: None (Preliminary result)   Collection Time: 07/23/21  3:26 PM   Specimen: BLOOD  Result Value Ref Range Status   Specimen Description BLOOD RIGHT ANTECUBITAL  Final   Special Requests AEROBIC BOTTLE ONLY Blood Culture adequate volume  Final   Culture   Final    NO GROWTH 2 DAYS Performed at [facility], [address], [place], [place] [address]    Report Status PENDING  Incomplete  Culture, blood (routine x  2)     Status: None (Preliminary result)   Collection Time: 07/23/21  9:55 PM   Specimen: BLOOD  Result Value Ref Range Status   Specimen Description BLOOD BLOOD RIGHT ARM  Final   Special Requests   Final    BOTTLES DRAWN AEROBIC AND ANAEROBIC Blood Culture adequate volume   Culture   Final    NO GROWTH 2 DAYS Performed at [facility], [address], [place], [place] [address]    Report Status PENDING  Incomplete  Resp Panel by RT-PCR (Flu A&B, Covid) Nasopharyngeal Swab     Status: None   Collection Time: 07/23/21  9:56 PM   Specimen: Nasopharyngeal Swab; Nasopharyngeal(NP) swabs in vial transport medium  Result Value Ref Range Status   SARS Coronavirus 2 by RT PCR NEGATIVE NEGATIVE Final    Comment: (NOTE) SARS-CoV-2 target nucleic acids are NOT DETECTED.  The SARS-CoV-2 RNA is generally detectable in upper respiratory specimens  during the acute phase of infection. The lowest concentration of SARS-CoV-2 viral copies this assay can detect is 138 copies/mL. A negative result does not preclude SARS-Cov-2 infection and should not be used as the sole basis for treatment or other patient management decisions. A negative result may occur with  improper specimen collection/handling, submission of specimen other than nasopharyngeal swab, presence of viral mutation(s) within the areas targeted by this assay, and inadequate number of viral copies(<138 copies/mL). A negative result must be combined with clinical observations, patient history, and epidemiological information. The expected result is Negative.  Fact Sheet for Patients:  EntrepreneurPulse.com.au  Fact Sheet for Healthcare Providers:  IncredibleEmployment.be  This test is no t yet approved or cleared by the [place] FDA and  has been authorized for detection and/or diagnosis of SARS-CoV-2 by FDA under an Emergency Use Authorization (EUA). This EUA will remain  in effect (meaning this test can be used) for the duration of the COVID-19 declaration under Section 564(b)(1) of the Act, 21 U.S.C.section 360bbb-3(b)(1), unless the authorization is terminated  or revoked sooner.       Influenza A by PCR NEGATIVE NEGATIVE Final   Influenza B by PCR NEGATIVE NEGATIVE Final    Comment: (NOTE) The Xpert Xpress SARS-CoV-2/FLU/RSV plus assay is intended as an aid in the diagnosis of influenza from Nasopharyngeal swab specimens and should not be used as a sole basis for treatment. Nasal washings and aspirates are unacceptable for Xpert Xpress SARS-CoV-2/FLU/RSV testing.  Fact Sheet for Patients: EntrepreneurPulse.com.au  Fact Sheet for Healthcare Providers: IncredibleEmployment.be  This test is not yet approved or cleared by the [place] FDA and has been authorized for detection  and/or diagnosis of SARS-CoV-2 by FDA under an Emergency Use Authorization (EUA). This EUA will remain in effect (meaning this test can be used) for the duration of the COVID-19 declaration under Section 564(b)(1) of the Act, 21 U.S.C. section 360bbb-3(b)(1), unless the authorization is terminated or revoked.  Performed at [facility], [place] [address] [place], [place] [address]     Labs: CBC: Recent Labs  Lab 07/23/21 1526 07/24/21 0346  WBC 6.4 6.0  NEUTROABS 3.9  --   HGB 8.9* 8.7*  HCT 27.3* 26.3*  MCV 97.8 97.4  PLT 230 098   Basic Metabolic Panel: Recent Labs  Lab 07/23/21 1526 07/24/21 0346  NA 137 136  K 3.5 4.0  CL 93* 92*  CO2 30 28  GLUCOSE 200* 156*  BUN 35* 47*  CREATININE 10.22* 11.16*  CALCIUM 9.1  8.8*   Liver Function Tests: No results for input(s): AST, ALT, ALKPHOS, BILITOT, PROT, ALBUMIN in the last 168 hours. CBG: Recent Labs  Lab 07/24/21 1144 07/24/21 1653 07/24/21 2108 07/25/21 0736 07/25/21 1300  GLUCAP 116* 280* 396* 197* 98    Discharge time spent: greater than 30 minutes.  Signed: Geradine Girt, DO [facility] 07/25/2021

## 2021-07-25 NOTE — Progress Notes (Signed)
?  Progress Note ? ? ? ?07/25/2021 ?1:50 PM ?1 Day Post-Op ? ?Subjective: No complaint ? ?Vitals:  ? 07/25/21 1240 07/25/21 1257  ?BP: 119/61 100/62  ?Pulse: 76 80  ?Resp: 15 16  ?Temp: 98.6 ?F (37 ?C) 97.9 ?F (36.6 ?C)  ?SpO2: 100% 99%  ? ? ?Physical Exam: ?Currently dialysis, left arm fistula.  There is a dressing on the surgical wound ?Palpable left radial pulse ? ?CBC ?   ?Component Value Date/Time  ? WBC 6.0 07/24/2021 0346  ? RBC 2.70 (L) 07/24/2021 0346  ? HGB 8.7 (L) 07/24/2021 0346  ? HGB 11.3 12/11/2020 1659  ? HCT 26.3 (L) 07/24/2021 0346  ? HCT 33.4 (L) 12/11/2020 1659  ? PLT 214 07/24/2021 0346  ? PLT 152 12/11/2020 1659  ? MCV 97.4 07/24/2021 0346  ? MCV 91 12/11/2020 1659  ? MCH 32.2 07/24/2021 0346  ? MCHC 33.1 07/24/2021 0346  ? RDW 12.9 07/24/2021 0346  ? RDW 13.3 12/11/2020 1659  ? LYMPHSABS 1.9 07/23/2021 1526  ? LYMPHSABS 2.3 11/09/2018 0934  ? MONOABS 0.5 07/23/2021 1526  ? EOSABS 0.0 07/23/2021 1526  ? EOSABS 0.2 11/09/2018 0934  ? BASOSABS 0.0 07/23/2021 1526  ? BASOSABS 0.1 11/09/2018 0934  ? ? ?BMET ?   ?Component Value Date/Time  ? NA 136 07/24/2021 0346  ? NA 134 12/11/2020 1659  ? K 4.0 07/24/2021 0346  ? CL 92 (L) 07/24/2021 0346  ? CO2 28 07/24/2021 0346  ? GLUCOSE 156 (H) 07/24/2021 0346  ? BUN 47 (H) 07/24/2021 0346  ? BUN 53 (H) 12/11/2020 1659  ? CREATININE 11.16 (H) 07/24/2021 0346  ? CALCIUM 8.8 (L) 07/24/2021 0346  ? GFRNONAA 4 (L) 07/24/2021 0346  ? GFRAA 10 (L) 07/17/2019 2221  ? ? ?INR ?   ?Component Value Date/Time  ? INR 1.1 09/20/2018 2305  ? ? ? ?Intake/Output Summary (Last 24 hours) at 07/25/2021 1350 ?Last data filed at 07/25/2021 1215 ?Gross per 24 hour  ?Intake 420 ml  ?Output 2007 ml  ?Net -1587 ml  ? ? ? ?Assessment:  51 y.o. female is revision left upper AV fistula with vision of ulcer ? ?Plan: ?Okay for discharge from vascular standpoint and can follow-up as needed ? ?Tabrina Esty C. Donzetta Matters, MD ?[facility] ?Office: [phone] ?Pager:  [phone] ? ?07/25/2021 ?1:50 PM ? ?

## 2021-07-26 LAB — HEPATITIS B SURFACE ANTIBODY, QUANTITATIVE: Hep B S AB Quant (Post): 836 m[IU]/mL (ref >9.9)

## 2021-07-27 ENCOUNTER — Encounter (HOSPITAL_COMMUNITY): Payer: Self-pay | Admitting: Vascular Surgery

## 2021-07-28 LAB — CULTURE, BLOOD (ROUTINE X 2)
Culture: NO GROWTH
Culture: NO GROWTH
Special Requests: ADEQUATE
Special Requests: ADEQUATE

## 2021-07-29 ENCOUNTER — Telehealth: Payer: Self-pay

## 2021-07-29 ENCOUNTER — Encounter (HOSPITAL_COMMUNITY): Payer: Self-pay | Admitting: Vascular Surgery

## 2021-07-29 ENCOUNTER — Encounter: Payer: Self-pay | Admitting: Nurse Practitioner

## 2021-07-29 NOTE — Telephone Encounter (Signed)
Transition Care Management Follow-up Telephone Call ?Date of discharge and from where: 07/25/2021 [facility]  ?How have you been since you were released from the hospital?  Pt states she is okay, she still has a cough. ?Any questions or concerns? No ? ?Items Reviewed: ?Did the pt receive and understand the discharge instructions provided? Yes  ?Medications obtained and verified? Yes  ?Other? Yes  ?Any new allergies since your discharge? No  ?Dietary orders reviewed? Yes ?Do you have support at home? Yes  ? ?Home Care and Equipment/Supplies: ?Were home health services ordered? no ?If so, what is the name of the agency? N/a  ?Has the agency set up a time to come to the patient's home? no ?Were any new equipment or medical supplies ordered?  No ?What is the name of the medical supply agency? N/a ?Were you able to get the supplies/equipment? no ?Do you have any questions related to the use of the equipment or supplies? No ? ?Functional Questionnaire: (I = Independent and D = Dependent) ?ADLs: i ? ?Bathing/Dressing- i ? ?Meal Prep- i ? ?Eating- i ? ?Maintaining continence- i ? ?Transferring/Ambulation- i ? ?Managing Meds- i ? ?Follow up appointments reviewed: ? ?PCP Hospital f/u appt confirmed? No  Scheduled to see n/a on n/a @ n/a. ?[facility] f/u appt confirmed? No  Scheduled to see n/a on n/a @ n/a. ?Are transportation arrangements needed? No  ?If their condition worsens, is the pt aware to call PCP or go to the Emergency Dept.? Yes ?Was the patient provided with contact information for the PCP's office or ED? Yes ?Was to pt encouraged to call back with questions or concerns? Yes  ?

## 2021-07-29 NOTE — Anesthesia Postprocedure Evaluation (Signed)
Anesthesia Post Note ? ?Patient: [facility] ? ?Procedure(s) Performed: REVISON OF ARTERIOVENOUS FISTULA ARM (Left) ? ?  ? ?Patient location during evaluation: PACU ?Anesthesia Type: General ?Level of consciousness: awake and alert ?Pain management: pain level controlled ?Vital Signs Assessment: post-procedure vital signs reviewed and stable ?Respiratory status: spontaneous breathing, nonlabored ventilation, respiratory function stable and patient connected to nasal cannula oxygen ?Cardiovascular status: blood pressure returned to baseline and stable ?Postop Assessment: no apparent nausea or vomiting ?Anesthetic complications: no ? ? ?No notable events documented. ? ?Last Vitals:  ?Vitals:  ? 07/25/21 1240 07/25/21 1257  ?BP: 119/61 100/62  ?Pulse: 76 80  ?Resp: 15 16  ?Temp: 37 ?C 36.6 ?C  ?SpO2: 100% 99%  ?  ?Last Pain:  ?Vitals:  ? 07/25/21 1257  ?TempSrc: Oral  ?PainSc:   ? ? ?  ?  ?  ?  ?  ?  ? ?Tylia Ewell ? ? ? ? ?

## 2021-08-07 NOTE — Progress Notes (Signed)
Late entry  ?Telephone encounter  ?  ?Provided service recovery on patient. 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 Patient verbalized understanding. All questions have been answered. Attending MD and Infection Disease MD have been notified and will continue to follow patient.  ?  ?

## 2021-08-10 ENCOUNTER — Ambulatory Visit: Payer: Medicare Other | Admitting: Internal Medicine

## 2021-09-01 ENCOUNTER — Other Ambulatory Visit: Payer: Self-pay | Admitting: Internal Medicine

## 2021-09-01 ENCOUNTER — Encounter: Payer: Managed Care, Other (non HMO) | Admitting: Internal Medicine

## 2021-09-01 DIAGNOSIS — Z1231 Encounter for screening mammogram for malignant neoplasm of breast: Secondary | ICD-10-CM

## 2021-09-02 ENCOUNTER — Ambulatory Visit: Payer: Medicare Other | Admitting: Nurse Practitioner

## 2021-09-09 ENCOUNTER — Other Ambulatory Visit: Payer: Self-pay | Admitting: Internal Medicine

## 2021-09-09 ENCOUNTER — Encounter: Payer: Self-pay | Admitting: Internal Medicine

## 2021-09-09 ENCOUNTER — Ambulatory Visit (INDEPENDENT_AMBULATORY_CARE_PROVIDER_SITE_OTHER): Payer: Medicare Other | Admitting: Internal Medicine

## 2021-09-09 VITALS — BP 118/69 | HR 61 | Ht 63.0 in | Wt 224.8 lb

## 2021-09-09 DIAGNOSIS — Z794 Long term (current) use of insulin: Secondary | ICD-10-CM | POA: Diagnosis not present

## 2021-09-09 DIAGNOSIS — E1122 Type 2 diabetes mellitus with diabetic chronic kidney disease: Secondary | ICD-10-CM | POA: Diagnosis not present

## 2021-09-09 DIAGNOSIS — Z992 Dependence on renal dialysis: Secondary | ICD-10-CM | POA: Diagnosis not present

## 2021-09-09 DIAGNOSIS — N186 End stage renal disease: Secondary | ICD-10-CM | POA: Diagnosis not present

## 2021-09-09 DIAGNOSIS — E785 Hyperlipidemia, unspecified: Secondary | ICD-10-CM

## 2021-09-09 DIAGNOSIS — Z6838 Body mass index (BMI) 38.0-38.9, adult: Secondary | ICD-10-CM

## 2021-09-09 DIAGNOSIS — E1169 Type 2 diabetes mellitus with other specified complication: Secondary | ICD-10-CM

## 2021-09-09 LAB — POCT GLYCOSYLATED HEMOGLOBIN (HGB A1C): Hemoglobin A1C: 7.5 % — AB (ref 4.0–5.6)

## 2021-09-09 LAB — LIPASE: Lipase: 499 U/L — ABNORMAL HIGH (ref 11.0–59.0)

## 2021-09-09 MED ORDER — HUMULIN R U-500 KWIKPEN 500 UNIT/ML ~~LOC~~ SOPN: PEN_INJECTOR | SUBCUTANEOUS | 11 refills | Status: DC

## 2021-09-09 NOTE — Patient Instructions (Addendum)
Please continue  ?- U500: ?- 140-200 units before b'fast ?- 100-140 units before dinner ?- Ozempic 2 mg weekly ? ?Please stop at the lab. ? ?Please return in 3-4 months. ?

## 2021-09-09 NOTE — Progress Notes (Signed)
Patient ID: Cassandra Campos, female   DOB: 08-20-1970, 51 y.o.   MRN: [id number]  ? ?This visit occurred during the SARS-CoV-2 public health emergency.  Safety protocols were in place, including screening questions prior to the visit, additional usage of staff PPE, and extensive cleaning of exam room while observing appropriate contact time as indicated for disinfecting solutions.  ? ?HPI: ?Cassandra Campos is a 51 y.o.-year-old female, returning for follow-up for DM2, dx in 1997, insulin-dependent right after dx, uncontrolled, with complications (ESRD-on HD since 12/2017, DR, PN).  Last visit 5 months ago. ? ?Interim hx: ?She continues to see the transplant clinic at [facility].  As of now she continues hemodialysis. ?No increased urination, blurry vision, chest pain.  She had 1 episode of nausea last week when she injected Ozempic, but otherwise, no GI symptoms with it. ?On 06/24/2021 she was found to have a very high lipase, at 543. ? ?Reviewed HbA1c levels: ?02/24/2021: HbA1c 6.8% ?Lab Results  ?Component Value Date  ? HGBA1C 6.3 (H) 02/02/2021  ? HGBA1C 8.5 (A) 07/30/2020  ? HGBA1C 7.9 (H) 01/08/2020  ? HGBA1C 8.7 (A) 09/11/2019  ? HGBA1C 10.9 (H) 04/19/2019  ? HGBA1C 6.9 (H) 11/09/2018  ? HGBA1C 7.1 (H) 07/11/2018  ? HGBA1C 8.5 (H) 03/27/2018  ? HGBA1C 8.2 (H) 02/08/2013  ?11/18/2020: HbA1c 8.9% ?05/26/2020: HbA1c 9.8% ? ?Pt is on a regimen of: ?- Ozempic 1 mg weekly -restarted 11/2020 >> 2 mg weekly ?- U500 insulin (started 2020): ?- 180-195 >> 200 >> 130-150 >> 140-200 units before breakfast  ?- 140-150 units before dinner if dinner is early, and 110-120 units if dinner is after dialysis >> 160-170 >> 140-160 >> 110-120 >> 110-140 units before dinner ?She was on Levemir and Humalog. ? ?Pt checks her sugars more than 4 times a day with her CGM (but forgot the receiver): ?- am: 98-300, usually 110-120 ?- 2h after b'fast: ? ?- lunch: 110-120s ?- 2h after lunch: ? ?- dinner: 108-130 ?- 2h after dinner:  158-250 ?- bedtime: n/c ? ?Previously: ? ? ?Lowest sugar was 58 (before dinner) >> 90 >> 52 >> 98; she has hypoglycemia awareness in the 60s. ?Highest sugar was 455 >>...300s >> 321 >> 300s (pie). ? ?Glucometer: Contour next ? ?Pt's meals are: ?- Breakfast: yoghurt and blueberries; egg McMuffin ?- Lunch: fish, tacos, subs, cake ?- Dinner: salad, steak, shrimp - occasionally after HD at 10 pm, but more recently she has this during dialysis, around 7 PM ?- Snacks: fruit, peanuts ? ?-+ End-stage renal disease, last BUN/creatinine:  ?Lab Results  ?Component Value Date  ? BUN 47 (H) 07/24/2021  ? BUN 35 (H) 07/23/2021  ? CREATININE 11.16 (H) 07/24/2021  ? CREATININE 10.22 (H) 07/23/2021  ?She is on hemodialysis: M, Tu, Th, F (home) - in the evening, for 3-4 hours: 6:30 PM to 10 PM ?She is on the kidney [facility] list at [facility]. Requirements for kidney transplant: <200 lbs, HbA1c <8% (? 7%), BMI <40. ? ?-+ HL; last set of lipids: ?06/24/2021: ? Ref Range & Units   ?LDL Direct <100 mg/dL 42   ?Total Cholesterol <200 MG/DL 130   ?Triglycerides <150 MG/DL 299 High    ?HDL Cholesterol >=60 MG/DL 42 Low    ?Total Chol / HDL Cholesterol <4.5 3.1   ?Non-HDL Cholesterol MG/DL 88   ? ?Lab Results  ?Component Value Date  ? CHOL 183 08/25/2020  ? HDL 39 (L) 08/25/2020  ? [place] 63 08/25/2020  ? TRIG 536 (H)  08/25/2020  ? CHOLHDL 4.7 (H) 08/25/2020  ?On Crestor 20. ? ?- last eye exam was 02/2021 (Dr. Katy Fitch): + DR. + h/o retinal detachment, + cataracts.  She was getting IO injections, not recently >> now needs Laser Sx. She also sees Dr. Posey Pronto (retina specialist). ? ?- she has numbness and tingling in her feet.  She changed from Lyrica to Neurontin 300 mg. ? ?Pt has FH of DM in M, F. ? ?She also has a history of NAFLD, OSA (but not on CPAP, since she could not tolerate it-now on nasal pillows), gout.  ? ?ROS: ? + numbness and tingling in his feet ? ?Past Medical History:  ?Diagnosis Date  ? Anemia associated with chronic renal failure    ? ASCUS of cervix with negative high risk HPV 06/2017  ? Back pain   ? Chest pain   ? CHF (congestive heart failure) ([place])   ? CKD (chronic kidney disease), stage IV ([place]) no dialysis yet  ? nephrologist-  dr Jamal Maes-- scheduled next visit 09-08-2017 (lov 07-05-2017)  ? Constipation   ? Diabetic retinopathy ([place])   ? Dialysis patient [facility])   ? Edema, lower extremity   ? Elevated transaminase level   ? Family history of breast cancer   ? Family history of ovarian cancer   ? Family history of stomach cancer   ? Fatty liver   ? FOLLOWED BY DR Henrene Pastor  ? GERD (gastroesophageal reflux disease)   ? Hypertension   ? Idiopathic gout of multiple sites   ? 09-05-2017 per pt stable  ? Insulin dependent type 2 diabetes mellitus ([place])   ? followed by dr Toy Care  ? Joint pain   ? Mixed hyperlipidemia   ? OSA (obstructive sleep apnea)   ? per study 10-20-2015 mild osa w/ AHI 10.3/hr;  09-05-2017 per pt has not used cpap since 1 yr ago  ? Palpitations   ? Peripheral neuropathy   ? feet  ? Pure hypercholesterolemia 05/21/2021  ? S/P arteriovenous (AV) fistula creation 07-04-2015  w/ revision 02-14-2017  ? left braniocephilic  ? Shortness of breath   ? Trigger thumb, right thumb   ? Umbilical hernia   ? Vitamin D deficiency   ? ?Past Surgical History:  ?Procedure Laterality Date  ? A/V FISTULAGRAM N/A 04/01/2021  ? Procedure: A/V FISTULAGRAM;  Surgeon: [name], MD;  Location: [place] [facility];  Service: Cardiovascular;  Laterality: N/A;  ? ABDOMINAL HYSTERECTOMY  07/11/2000   dr gott  ? TAH/BSO for irregular bleeding and leiomyoma  ? AV FISTULA PLACEMENT Left 07/04/2015  ? Procedure: ARTERIOVENOUS (AV) FISTULA CREATION-LEFT;  Surgeon: Mal Misty, MD;  Location: [facility] OR;  Service: Vascular;  Laterality: Left;  ? BREAST EXCISIONAL BIOPSY Right   ? benign  ? CARPAL TUNNEL RELEASE Bilateral 1993 and 1994  ? CESAREAN SECTION  1993:  09-25-1999  ? BILATERAL TUBAL LIGATION W/ LAST C/S  ? DILATION AND CURETTAGE OF UTERUS    ?  EXPLORATORY LAPARTOMY / LYSIS ADHESIONS/  BILATERAL SALPINGOOPHORECTOMY  07-20-2011  dr s. Rhodia Albright  [facility]  ? FISTULA SUPERFICIALIZATION Left 08/28/2015  ? Procedure: BRACHIOCEPHALIC FISTULA SUPERFICIALIZATION;  Surgeon: Mal Misty, MD;  Location: [place];  Service: Vascular;  Laterality: Left;  ? PATCH ANGIOPLASTY Left 02/14/2017  ? Procedure: PATCH ANGIOPLASTY;  Surgeon: Elam Dutch, MD;  Location: [place];  Service: Vascular;  Laterality: Left;  ? [place]  ? exploration for ectopic preg.  ? PERIPHERAL  VASCULAR BALLOON ANGIOPLASTY  04/01/2021  ? Procedure: PERIPHERAL VASCULAR BALLOON ANGIOPLASTY;  Surgeon: [name], MD;  Location: [place] [facility];  Service: Cardiovascular;;  ? RETINAL DETACHMENT SURGERY Left 2015  ? REVISON OF ARTERIOVENOUS FISTULA Left 02/14/2017  ? Procedure: REVISON OF ARTERIOVENOUS FISTULA  LEFT ARM;  Surgeon: Elam Dutch, MD;  Location: [facility];  Service: Vascular;  Laterality: Left;  ? REVISON OF ARTERIOVENOUS FISTULA Left 07/24/2021  ? Procedure: REVISON OF ARTERIOVENOUS FISTULA ARM;  Surgeon: Angelia Mould, MD;  Location: [place];  Service: Vascular;  Laterality: Left;  ? TRIGGER FINGER RELEASE Left 2015  ? thumb  ? TRIGGER FINGER RELEASE Right 09/09/2017  ? Procedure: RELEASE TRIGGER FINGER/A-1 PULLEY RIGHT THUMB;  Surgeon: Dorna Leitz, MD;  Location: [facility];  Service: Orthopedics;  Laterality: Right;  ? ?Social History  ? ?Socioeconomic History  ? Marital status: Married  ?  Spouse name: Not on file  ? Number of children: 2  ? Years of education: Not on file  ? Highest education level: Not on file  ?Occupational History  ? Occupation: Press photographer and insurance  ?Tobacco Use  ? Smoking status: Never  ? Smokeless tobacco: Never  ?Vaping Use  ? Vaping Use: Never used  ?Substance and Sexual Activity  ? Alcohol use: Yes  ?  Alcohol/week: 0.0 standard drinks  ?  Comment: Very rare  ? Drug use: No  ? Sexual activity: Yes  ?  Birth  control/protection: Surgical  ?  Comment: HYST-1st intercourse 51 yo-Fewer than 5 partners  ?Other Topics Concern  ? Not on file  ?Social History Narrative  ? Not on file  ? ?Social Determinants of Health

## 2021-09-23 ENCOUNTER — Telehealth: Payer: Self-pay | Admitting: Internal Medicine

## 2021-09-23 NOTE — Telephone Encounter (Signed)
Spoke with patient to schedule Medicare Annual Wellness Visit (AWV) either virtually or in office.   ? ?She wanted same day as pcp appointment she will call office to schedule ? ? ?awvi 09/14/21 per [place] ? please schedule at anytime with [facility]  ? ? ?This should be a 45 minute visit.  ?

## 2021-10-16 ENCOUNTER — Ambulatory Visit
Admission: RE | Admit: 2021-10-16 | Discharge: 2021-10-16 | Disposition: A | Discharge status: Home / Self Care | Payer: Medicare Other | Source: Ambulatory Visit | Attending: Internal Medicine | Admitting: Internal Medicine

## 2021-10-16 DIAGNOSIS — Z1231 Encounter for screening mammogram for malignant neoplasm of breast: Secondary | ICD-10-CM

## 2021-10-22 LAB — BASIC METABOLIC PANEL
BUN: 33 — AB (ref 4–21)
Creatinine: 10.4 — AB (ref 0.5–1.1)
Glucose: 57

## 2021-10-22 LAB — IRON,TIBC AND FERRITIN PANEL: UIBC: 141

## 2021-10-22 LAB — COMPREHENSIVE METABOLIC PANEL: Calcium: 8.1 — AB (ref 8.7–10.7)

## 2021-10-27 ENCOUNTER — Telehealth: Payer: Self-pay | Admitting: Internal Medicine

## 2021-10-27 LAB — CBC: RBC: 3.58 — AB (ref 3.87–5.11)

## 2021-10-27 LAB — CBC AND DIFFERENTIAL
HCT: 34 — AB (ref 36–46)
Hemoglobin: 11.2 — AB (ref 12.0–16.0)
Neutrophils Absolute: 31.2

## 2021-10-27 NOTE — Telephone Encounter (Signed)
Spoke with patient to  schedule Medicare Annual Wellness Visit (AWV) either virtually or in office.    She stated she needed to check her calendar and would call back    awvi 09/14/21 per [place]  please schedule at anytime with [facility]    This should be a 45 minute visit.

## 2021-11-03 ENCOUNTER — Ambulatory Visit (INDEPENDENT_AMBULATORY_CARE_PROVIDER_SITE_OTHER): Payer: Medicare Other | Admitting: Internal Medicine

## 2021-11-03 ENCOUNTER — Encounter: Payer: Self-pay | Admitting: Internal Medicine

## 2021-11-03 VITALS — BP 116/78 | HR 92 | Temp 98.3°F | Ht 63.0 in | Wt 220.6 lb

## 2021-11-03 DIAGNOSIS — Z0000 Encounter for general adult medical examination without abnormal findings: Secondary | ICD-10-CM | POA: Diagnosis not present

## 2021-11-03 DIAGNOSIS — N186 End stage renal disease: Secondary | ICD-10-CM | POA: Diagnosis not present

## 2021-11-03 DIAGNOSIS — Z6838 Body mass index (BMI) 38.0-38.9, adult: Secondary | ICD-10-CM

## 2021-11-03 DIAGNOSIS — R413 Other amnesia: Secondary | ICD-10-CM

## 2021-11-03 DIAGNOSIS — E1122 Type 2 diabetes mellitus with diabetic chronic kidney disease: Secondary | ICD-10-CM | POA: Diagnosis not present

## 2021-11-03 DIAGNOSIS — Z992 Dependence on renal dialysis: Secondary | ICD-10-CM

## 2021-11-03 DIAGNOSIS — I120 Hypertensive chronic kidney disease with stage 5 chronic kidney disease or end stage renal disease: Secondary | ICD-10-CM

## 2021-11-03 DIAGNOSIS — Z794 Long term (current) use of insulin: Secondary | ICD-10-CM

## 2021-11-03 NOTE — Progress Notes (Signed)
[facility] to [facility] Annual Wellness Visit     Patient: Cassandra Campos, Female    DOB: 06/08/1970, 51 y.o.   MRN: [id number] Visit Date: 11/03/2021  Today's Provider: Maximino Greenland, MD   Chief Complaint  Patient presents with   Annual Exam   Diabetes   Hypertension   Subjective    [facility] [name] is a 51 y.o. female who presents today for her Welcome to [employer] Visit. She reports consuming a  Renal/diabetic  diet. Home exercise routine includes walking 2-3 hrs per week. She generally feels fairly well. She reports sleeping fairly well. She does have additional problems to discuss today.   She presents today for a full physical exam. She now has Medicare. She is followed by Marny Lowenstein, NP  for her GYN exams.  She is anxiously awaiting approval for kidney transplant, she is working on losing weight.   Diabetes She presents for her follow-up diabetic visit. She has type 2 diabetes mellitus. Her disease course has been stable. There are no hypoglycemic associated symptoms. Pertinent negatives for diabetes include no blurred vision and no chest pain. There are no hypoglycemic complications. Diabetic complications include nephropathy, peripheral neuropathy and retinopathy. Risk factors for coronary artery disease include dyslipidemia, hypertension, diabetes mellitus, post-menopausal, sedentary lifestyle and obesity. She is compliant with treatment some of the time. She is following a diabetic diet. She has not had a previous visit with a dietitian. She participates in exercise intermittently. Her home blood glucose trend is fluctuating minimally. Her breakfast blood glucose is taken between 9-10 am. Her breakfast blood glucose range is generally 140-180 mg/dl. Eye exam is current.  Hypertension This is a chronic problem. The current episode started more than 1 year ago. The problem has been gradually improving since onset. The problem is controlled.  Pertinent negatives include no blurred vision, chest pain or shortness of breath. Palpitations: she c/o palpitations. recurrent. no associated chest pain. random. occurs at rest. .The current treatment provides moderate improvement. Hypertensive end-organ damage includes retinopathy. Identifiable causes of hypertension include renovascular disease.      Medications: Outpatient Medications Prior to Visit  Medication Sig   albuterol (VENTOLIN HFA) 108 (90 Base) MCG/ACT inhaler Inhale 2 puffs into the lungs every 6 (six) hours as needed for wheezing or shortness of breath.   allopurinol (ZYLOPRIM) 300 MG tablet TAKE ONE TABLET BY MOUTH EVERY MORNING (Patient taking differently: Take 300 mg by mouth daily.)   B Complex-C-Folic Acid (RENAL) 1 MG CAPS Take 1 capsule by mouth at bedtime.   cinacalcet (SENSIPAR) 60 MG tablet Take 60 mg by mouth at bedtime.   Colchicine (MITIGARE) 0.6 MG CAPS TAKE ONE CAPSULE BY MOUTH ONCE DAILY   Continuous Blood Gluc Receiver (FREESTYLE LIBRE 2 READER) DEVI 1 each by Does not apply route daily.   Continuous Blood Gluc Sensor (FREESTYLE LIBRE 2 SENSOR) MISC USE AS DIRECTED TO test blood sugar, CHANGE EVERY 14 DAYS   Insulin Pen Needle 32G X 4 MM MISC Use 2x a day   insulin regular human CONCENTRATED (HUMULIN R U-500 KWIKPEN) 500 UNIT/ML KwikPen Inject under skin 200 units before breakfast and 140 units before dinner   Microlet Lancets MISC Use as directed to check blood sugars 4 times per day dx: e11.22   pregabalin (LYRICA) 150 MG capsule Take 1 capsule (150 mg total) by mouth 2 (two) times daily.   rosuvastatin (CRESTOR) 20 MG tablet Take 1 tablet (20 mg total) by mouth  daily.   [DISCONTINUED] estradiol (ESTRACE) 0.5 MG tablet Take 1 tablet (0.5 mg total) by mouth daily.   lanthanum (FOSRENOL) 1000 MG chewable tablet Chew 1,000 mg by mouth 3 (three) times daily with meals.   lidocaine-prilocaine (EMLA) cream    [DISCONTINUED] guaiFENesin 200 MG tablet Take 1 tablet  (200 mg total) by mouth every 4 (four) hours as needed for cough or to loosen phlegm. (Patient not taking: Reported on 11/03/2021)   [DISCONTINUED] metoprolol succinate (TOPROL-XL) 100 MG 24 hr tablet Take 1 tablet (100 mg total) by mouth daily. Take with or immediately following a meal.   [DISCONTINUED] oxyCODONE (ROXICODONE) 5 MG immediate release tablet Take 1 tablet (5 mg total) by mouth every 4 (four) hours as needed. (Patient not taking: Reported on 11/03/2021)   No facility-administered medications prior to visit.    No Known Allergies  Patient Care Team: Glendale Chard, MD as PCP - General (Internal Medicine) Lorretta Harp, MD as PCP - Cardiology (Cardiology) Jamal Maes, MD as Consulting Physician (Nephrology)  Review of Systems  Constitutional: Negative.   HENT: Negative.    Eyes: Negative.  Negative for blurred vision.  Respiratory: Negative.  Negative for shortness of breath.   Cardiovascular: Negative.  Negative for chest pain. Palpitations: she c/o palpitations. recurrent. no associated chest pain. random. occurs at rest. . Endocrine: Negative.   Genitourinary: Negative.   Musculoskeletal: Negative.   Skin: Negative.   Allergic/Immunologic: Negative.   Neurological: Negative.        She c/o memory issues. She is not sure what is contributing to her sx.   Hematological: Negative.   Psychiatric/Behavioral: Negative.      Last lipids Lab Results  Component Value Date   CHOL 217 (H) 11/03/2021   HDL 47 11/03/2021   LDLCALC 110 (H) 11/03/2021   TRIG 349 (H) 11/03/2021   CHOLHDL 4.6 (H) 11/03/2021        Objective    Vitals: BP 116/78   Pulse 92   Temp 98.3 F (36.8 C)   Ht '5\' 3"'$  (1.6 m)   Wt 220 lb 9.6 oz (100.1 kg)   SpO2 98%   BMI 39.08 kg/m  BP Readings from Last 3 Encounters:  11/18/21 114/68  11/03/21 116/78  09/09/21 118/69       Physical Exam Vitals and nursing note reviewed.  Constitutional:      Appearance: Normal appearance. She  is obese.  HENT:     Head: Normocephalic and atraumatic.     Right Ear: Tympanic membrane, ear canal and external ear normal.     Left Ear: Tympanic membrane, ear canal and external ear normal.     Nose: Nose normal.     Mouth/Throat:     Mouth: Mucous membranes are moist.     Pharynx: Oropharynx is clear.  Eyes:     Extraocular Movements: Extraocular movements intact.     Conjunctiva/sclera: Conjunctivae normal.     Pupils: Pupils are equal, round, and reactive to light.  Cardiovascular:     Rate and Rhythm: Normal rate and regular rhythm.     Pulses: Normal pulses.          Dorsalis pedis pulses are 2+ on the right side and 2+ on the left side.     Heart sounds: Normal heart sounds.     Comments: Fistula LUE Pulmonary:     Effort: Pulmonary effort is normal.     Breath sounds: Normal breath sounds.  Chest:  Breasts:  Tanner Score is 5.     Right: Normal.     Left: Normal.  Abdominal:     General: Bowel sounds are normal.     Palpations: Abdomen is soft.     Comments: Rounded, soft, NABS.   Genitourinary:    Comments: deferred Musculoskeletal:        General: Normal range of motion.     Cervical back: Normal range of motion and neck supple.  Feet:     Right foot:     Protective Sensation: 5 sites tested.  5 sites sensed.     Skin integrity: Dry skin present.     Toenail Condition: Right toenails are normal.     Left foot:     Protective Sensation: 5 sites tested.  5 sites sensed.     Skin integrity: Dry skin present.     Toenail Condition: Left toenails are normal.  Skin:    General: Skin is warm and dry.  Neurological:     General: No focal deficit present.     Mental Status: She is alert and oriented to person, place, and time.  Psychiatric:        Mood and Affect: Mood normal.        Behavior: Behavior normal.     Most recent functional status assessment:    11/03/2021    3:03 PM  In your present state of health, do you have any difficulty performing  the following activities:  Hearing? 0  Vision? 1  Difficulty concentrating or making decisions? 1  Comment sometimes concentrating  Walking or climbing stairs? 0  Dressing or bathing? 0  Doing errands, shopping? 0   Most recent fall risk assessment:    11/03/2021    3:02 PM  [place] in the past year? 0  Number falls in past yr: 0  Injury with Fall? 0  Risk for fall due to : No Fall Risks  Follow up Falls evaluation completed    Most recent depression screenings:    11/03/2021    2:58 PM 02/02/2021    2:42 PM  PHQ 2/9 Scores  PHQ - 2 Score 0 0   Most recent cognitive screening:    11/03/2021    3:03 PM  6CIT Screen  What Year? 0 points  What time? 0 points  Count back from 20 0 points  Months in reverse 0 points  Repeat phrase 0 points   Most recent Audit-C alcohol use screening    11/03/2021    3:02 PM  Alcohol Use Disorder Test (AUDIT)  1. How often do you have a drink containing alcohol? 0  2. How many drinks containing alcohol do you have on a typical day when you are drinking? 0  3. How often do you have six or more drinks on one occasion? 1  AUDIT-C Score 1   A score of 3 or more in women, and 4 or more in men indicates increased risk for alcohol abuse, EXCEPT if all of the points are from question 1   Results for orders placed or performed in visit on 11/03/21  Lipid panel  Result Value Ref Range   Cholesterol, Total 217 (H) 100 - 199 mg/dL   Triglycerides 349 (H) 0 - 149 mg/dL   HDL 47 >39 mg/dL   VLDL Cholesterol Cal 60 (H) 5 - 40 mg/dL   LDL Chol Calc (NIH) 110 (H) 0 - 99 mg/dL   Chol/HDL Ratio 4.6 (H) 0.0 -  4.4 ratio    Assessment & Plan     Annual wellness visit done today including the all of the following: Reviewed patient's Family Medical History Reviewed and updated list of patient's medical providers Assessment of cognitive impairment was done Assessed patient's functional ability Established a written schedule for health  screening [place] Completed and Reviewed  Exercise Activities and Dietary recommendations  Goals      Increase physical activity        Immunization History  Administered Date(s) Administered   Hepatitis B 02/25/2017, 12/29/2017   Hepatitis B, adult 02/25/2017, 12/29/2017   Influenza, Seasonal, Injecte, Preservative Fre 01/30/2018   Influenza,inj,Quad PF,6+ Mos 02/19/2021   Influenza-Unspecified 02/28/2018, 01/30/2020   PFIZER(Purple Top)SARS-COV-2 Vaccination 07/27/2019, 08/23/2019, 02/13/2020   Tdap 04/19/2019    Health Maintenance  Topic Date Due   Zoster Vaccines- Shingrix (1 of 2) Never done   COVID-19 Vaccine (4 - Booster for Pfizer series) 04/09/2020   INFLUENZA VACCINE  12/15/2021   OPHTHALMOLOGY EXAM  03/04/2022   HEMOGLOBIN A1C  03/11/2022   FOOT EXAM  09/10/2022   PAP SMEAR-Modifier  11/19/2022   MAMMOGRAM  10/17/2023   COLONOSCOPY (Pts 45-35yr Insurance coverage will need to be confirmed)  03/25/2027   TETANUS/TDAP  04/18/2029   Hepatitis C Screening  Completed   HIV Screening  Completed   HPV VACCINES  Aged Out   URINE MICROALBUMIN  Discontinued   1. Welcome to Medicare preventive visit The annual wellness visit was performed including discussion of advanced directives, assessment of functional status and cognitive function. EKG performed, . A full exam was also performed. She [facility]  PATIENT IS ADVISED TO GET 30-45 MINUTES REGULAR EXERCISE NO LESS THAN FOUR TO FIVE DAYS PER WEEK - BOTH WEIGHTBEARING EXERCISES AND AEROBIC ARE RECOMMENDED.  PATIENT IS ADVISED TO FOLLOW A HEALTHY DIET WITH AT LEAST SIX FRUITS/VEGGIES PER DAY, DECREASE INTAKE OF RED MEAT, AND TO INCREASE FISH INTAKE TO TWO DAYS PER WEEK.  MEATS/FISH SHOULD NOT BE FRIED, BAKED OR BROILED IS PREFERABLE.  IT IS ALSO IMPORTANT TO CUT BACK ON YOUR SUGAR INTAKE. PLEASE AVOID ANYTHING WITH ADDED SUGAR, CORN SYRUP OR OTHER SWEETENERS. IF YOU  MUST USE A SWEETENER, YOU CAN TRY STEVIA. IT IS ALSO IMPORTANT TO AVOID ARTIFICIALLY SWEETENERS AND DIET BEVERAGES. LASTLY, I SUGGEST WEARING SPF 50 SUNSCREEN ON EXPOSED PARTS AND ESPECIALLY WHEN IN THE DIRECT SUNLIGHT FOR AN EXTENDED PERIOD OF TIME.  PLEASE AVOID FAST FOOD RESTAURANTS AND INCREASE YOUR WATER INTAKE.   2. Type 2 diabetes mellitus with chronic kidney disease on chronic dialysis, with long-term current use of insulin (HCC) Comments: Diabetic foot exam performed, she is followed by Endo. Importance of dietary/ medication/exercise compliance was d/w pt. Endo input is appreciated. DISCUSSED WITH THE PATIENT AT LENGTH REGARDING THE GOALS OF GLYCEMIC CONTROL AND POSSIBLE LONG-TERM COMPLICATIONS.  I  ALSO STRESSED THE IMPORTANCE OF COMPLIANCE WITH HOME GLUCOSE MONITORING, DIETARY RESTRICTIONS INCLUDING AVOIDANCE OF SUGARY DRINKS/PROCESSED FOODS,  ALONG WITH REGULAR EXERCISE.  I  ALSO STRESSED THE IMPORTANCE OF ANNUAL EYE EXAMS, SELF FOOT CARE AND COMPLIANCE WITH OFFICE VISITS. - Lipid panel  3. 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 EKG performed, PRWP, low voltage, consider old anterior infarct - no new changes. Advised to follow low sodium diet.  She will f/u in six months for re-evaluation.  - EKG 12-Lead -  Lipid panel  4. Memory loss Comments: She has been referred to Neuro for further evaluation. She is encouraged to follow anti-inflammatory diet.   5. Class 2 severe obesity due to excess calories with serious comorbidity and body mass index (BMI) of 38.0 to 38.9 in adult [facility]) Comments: She is encouraged to strive for BMI<35 to decrease cardiac risk, while aiming for at least 150 minutes of exercise per week.    I, Maximino Greenland, MD, have reviewed all documentation for this visit. The documentation on 11/03/21 for the exam, diagnosis, procedures, and orders are all accurate and  complete.  Return in 6 months (on 05/05/2022) for 1 year HM, 4 month bpc.Maximino Greenland, MD  [facility] [phone] (phone) [phone] (fax)  [place] PATIENT IS ENCOURAGED TO PRACTICE SOCIAL DISTANCING DUE TO THE COVID-19 PANDEMIC.

## 2021-11-03 NOTE — Patient Instructions (Addendum)
Place Cassandra Campos initial physical female patient instructions here.   Health Maintenance, Female Adopting a healthy lifestyle and getting preventive care are important in promoting health and wellness. Ask your health care provider about: The right schedule for you to have regular tests and exams. Things you can do on your own to prevent diseases and keep yourself healthy. What should I know about diet, weight, and exercise? Eat a healthy diet  Eat a diet that includes plenty of vegetables, fruits, low-fat dairy products, and lean protein. Do not eat a lot of foods that are high in solid fats, added sugars, or sodium. Maintain a healthy weight Body mass index (BMI) is used to identify weight problems. It estimates body fat based on height and weight. Your health care provider can help determine your BMI and help you achieve or maintain a healthy weight. Get regular exercise Get regular exercise. This is one of the most important things you can do for your health. Most adults should: Exercise for at least 150 minutes each week. The exercise should increase your heart rate and make you sweat (moderate-intensity exercise). Do strengthening exercises at least twice a week. This is in addition to the moderate-intensity exercise. Spend less time sitting. Even light physical activity can be beneficial. Watch cholesterol and blood lipids Have your blood tested for lipids and cholesterol at 50 years of age, then have this test every 5 years. Have your cholesterol levels checked more often if: Your lipid or cholesterol levels are high. You are older than 51 years of age. You are at high risk for heart disease. What should I know about cancer screening? Depending on your health history and family history, you may need to have cancer screening at various ages. This may include screening for: Breast cancer. Cervical cancer. Colorectal cancer. Skin cancer. Lung cancer. What should I know about heart  disease, diabetes, and high blood pressure? Blood pressure and heart disease High blood pressure causes heart disease and increases the risk of stroke. This is more likely to develop in people who have high blood pressure readings or are overweight. Have your blood pressure checked: Every 3-5 years if you are 31-76 years of age. Every year if you are 72 years old or older. Diabetes Have regular diabetes screenings. This checks your fasting blood sugar level. Have the screening done: Once every three years after age 73 if you are at a normal weight and have a low risk for diabetes. More often and at a younger age if you are overweight or have a high risk for diabetes. What should I know about preventing infection? Hepatitis B If you have a higher risk for hepatitis B, you should be screened for this virus. Talk with your health care provider to find out if you are at risk for hepatitis B infection. Hepatitis C Testing is recommended for: Everyone born from [birth year] through 1964. Anyone with known risk factors for hepatitis C. Sexually transmitted infections (STIs) Get screened for STIs, including gonorrhea and chlamydia, if: You are sexually active and are younger than 50 years of age. You are older than 50 years of age and your health care provider tells you that you are at risk for this type of infection. Your sexual activity has changed since you were last screened, and you are at increased risk for chlamydia or gonorrhea. Ask your health care provider if you are at risk. Ask your health care provider about whether you are at high risk for HIV. Your health care  provider may recommend a prescription medicine to help prevent HIV infection. If you choose to take medicine to prevent HIV, you should first get tested for HIV. You should then be tested every 3 months for as long as you are taking the medicine. Pregnancy If you are about to stop having your period (premenopausal) and you may become  pregnant, seek counseling before you get pregnant. Take 400 to 800 micrograms (mcg) of folic acid every day if you become pregnant. Ask for birth control (contraception) if you want to prevent pregnancy. Osteoporosis and menopause Osteoporosis is a disease in which the bones lose minerals and strength with aging. This can result in bone fractures. If you are 32 years old or older, or if you are at risk for osteoporosis and fractures, ask your health care provider if you should: Be screened for bone loss. Take a calcium or vitamin D supplement to lower your risk of fractures. Be given hormone replacement therapy (HRT) to treat symptoms of menopause. Follow these instructions at home: Alcohol use Do not drink alcohol if: Your health care provider tells you not to drink. You are pregnant, may be pregnant, or are planning to become pregnant. If you drink alcohol: Limit how much you have to: 0-1 drink a day. Know how much alcohol is in your drink. In the [place] one drink equals one 12 oz bottle of beer (355 mL), one 5 oz glass of wine (148 mL), or one 1 oz glass of hard liquor (44 mL). Lifestyle Do not use any products that contain nicotine or tobacco. These products include cigarettes, chewing tobacco, and vaping devices, such as e-cigarettes. If you need help quitting, ask your health care provider. Do not use street drugs. Do not share needles. Ask your health care provider for help if you need support or information about quitting drugs. General instructions Schedule regular health, dental, and eye exams. Stay current with your vaccines. Tell your health care provider if: You often feel depressed. You have ever been abused or do not feel safe at home. Summary Adopting a healthy lifestyle and getting preventive care are important in promoting health and wellness. Follow your health care provider's instructions about healthy diet, exercising, and getting tested or screened for  diseases. Follow your health care provider's instructions on monitoring your cholesterol and blood pressure. This information is not intended to replace advice given to you by your health care provider. Make sure you discuss any questions you have with your health care provider. Document Revised: 09/22/2020 Document Reviewed: 09/22/2020 Elsevier Patient Education  [place].

## 2021-11-04 LAB — LIPID PANEL
Chol/HDL Ratio: 4.6 ratio — ABNORMAL HIGH (ref 0.0–4.4)
Cholesterol, Total: 217 mg/dL — ABNORMAL HIGH (ref 100–199)
HDL: 47 mg/dL (ref >39)
LDL Chol Calc (NIH): 110 mg/dL — ABNORMAL HIGH (ref 0–99)
Triglycerides: 349 mg/dL — ABNORMAL HIGH (ref 0–149)
VLDL Cholesterol Cal: 60 mg/dL — ABNORMAL HIGH (ref 5–40)

## 2021-11-10 ENCOUNTER — Other Ambulatory Visit: Payer: Self-pay | Admitting: Nephrology

## 2021-11-10 DIAGNOSIS — Z7682 Awaiting organ transplant status: Secondary | ICD-10-CM

## 2021-11-12 ENCOUNTER — Ambulatory Visit
Admission: RE | Admit: 2021-11-12 | Discharge: 2021-11-12 | Disposition: A | Discharge status: Home / Self Care | Payer: Medicare Other | Source: Ambulatory Visit | Attending: Nephrology | Admitting: Nephrology

## 2021-11-12 DIAGNOSIS — Z7682 Awaiting organ transplant status: Secondary | ICD-10-CM

## 2021-11-16 ENCOUNTER — Encounter: Payer: Self-pay | Admitting: Internal Medicine

## 2021-11-16 NOTE — Progress Notes (Signed)
Jalexia Preiser [facility] Apr 04, 1971 [id number]   51 y.o. [id number] presents for breast and pelvic exam.  Postmenopausal - restarted on ERT in January due to significant hot flashes and insomnia. 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 2019 ASCUS neg HR HPV, otherwise normal pap history. T2DM managed by PCP. Dialysis 4 times per week, on transplant list. Maternal aunt and multiple cousins with breast cancer, mother with h/o ovarian cancer, other cancers in family as well, negative genetic testing last year. Would like STD screening today.  Gynecologic History No LMP recorded. Patient has had a hysterectomy.   Contraception/Family planning: status post hysterectomy Sexually active: Yes  Health Maintenance Last Pap: 07/13/2018. Results were: Normal Last mammogram: 10/16/2021. Results were: Normal  Last colonoscopy: 03/24/2017. Results were: Normal, 10-year recall Last Dexa: years ago per patient, prior to menopause  Past medical history, past surgical history, family history and social history were all reviewed and documented in the EPIC chart. Married. Works for [employer]. Son age 59 and daughter age 72, both local.   ROS:  A ROS was performed and pertinent positives and negatives are included.  Exam:  Vitals:   11/18/21 0848  BP: 114/68  Pulse: 97  SpO2: 97%  Weight: 218 lb (98.9 kg)  Height: 5' 2.25" (1.581 m)    Body mass index is 39.55 kg/m.  General appearance:  Normal Thyroid:  Symmetrical, normal in size, without palpable masses or nodularity. Respiratory  Auscultation:  Clear without wheezing or rhonchi Cardiovascular  Auscultation:  Regular rate, without rubs, murmurs or gallops  Edema/varicosities:  Not grossly evident Abdominal  Soft,nontender, without masses, guarding or rebound.  Liver/spleen:  No organomegaly noted  Hernia:  None appreciated  Skin  Inspection:  Grossly normal Breasts: Examined lying and  sitting.   Right: Without masses, retractions, nipple discharge or axillary adenopathy.   Left: Without masses, retractions, nipple discharge or axillary adenopathy. Gentitourinary   Inguinal/mons:  Normal without inguinal adenopathy  External genitalia:  Normal appearing vulva with no masses, tenderness, or lesions  BUS/Urethra/Skene's glands:  Normal  Vagina:  Normal appearing with normal color, moderate discharge, no lesions  Cervix:  Absent  Uterus:  Absent  Adnexa/parametria:     Rt: Normal in size, without masses or tenderness.   Lt: Normal in size, without masses or tenderness.  Anus and perineum: Normal  Digital rectal exam: Normal sphincter tone without palpated masses or tenderness  Patient informed chaperone available to be present for breast and pelvic exam. Patient has requested no chaperone to be present. Patient has been advised what will be completed during breast and pelvic exam.   Assessment/Plan:  51 y.o. [id number] for breast and pelvic exam.   Well female exam with routine gynecological exam - Plan: Cytology - PAP( [place]). Education provided on SBEs, importance of preventative screenings, current guidelines, high calcium diet, regular exercise, and multivitamin daily. Labs with PCP.   Screen for STD (sexually transmitted disease) - Plan: Cytology - PAP( [place]), RPR, HIV Antibody (routine testing w rflx). GC, chlamydia, trich added to pap.   Postmenopausal hormone therapy - Plan: estradiol (ESTRACE) 0.5 MG tablet daily. Doing well on this. Ran out 6 months ago and she had significant return of hot flashes. Aware of risks with continue use. Refill x 1 year provided.   Abnormal Pap smear of vagina - Plan: Cytology - PAP( [place]). 2019 ASCUS neg HR HPV, normal in 2020. Otherwise normal pap history. If normal  we discussed the option to stop screenings per guidelines and she is agreeable.   Estrogen deficiency - Plan: DG Bone Density. S/P TAV BSO. Bone  density years ago prior to menopause. Will repeat now.   Encounter for screening for human immunodeficiency virus (HIV) - Plan: HIV Antibody (routine testing w rflx)  Screening for breast cancer - Normal mammogram history.  Continue annual screenings.  Normal breast exam today.   Screening for colon cancer - 2018 colonoscopy. Will repeat at 10-year interval per GI's recommendation.  Return in 1 year for breast and pelvic exam.     [name] DNP, 9:22 AM 11/18/2021

## 2021-11-18 ENCOUNTER — Encounter: Payer: Self-pay | Admitting: Nurse Practitioner

## 2021-11-18 ENCOUNTER — Other Ambulatory Visit (HOSPITAL_COMMUNITY)
Admission: RE | Admit: 2021-11-18 | Discharge: 2021-11-18 | Disposition: A | Discharge status: Home / Self Care | Payer: Medicare Other | Source: Ambulatory Visit | Attending: Nurse Practitioner | Admitting: Nurse Practitioner

## 2021-11-18 ENCOUNTER — Ambulatory Visit (INDEPENDENT_AMBULATORY_CARE_PROVIDER_SITE_OTHER): Payer: Medicare Other | Admitting: Nurse Practitioner

## 2021-11-18 VITALS — BP 114/68 | HR 97 | Ht 62.25 in | Wt 218.0 lb

## 2021-11-18 DIAGNOSIS — Z79890 Hormone replacement therapy (postmenopausal): Secondary | ICD-10-CM | POA: Diagnosis not present

## 2021-11-18 DIAGNOSIS — E2839 Other primary ovarian failure: Secondary | ICD-10-CM

## 2021-11-18 DIAGNOSIS — Z1272 Encounter for screening for malignant neoplasm of vagina: Secondary | ICD-10-CM

## 2021-11-18 DIAGNOSIS — R87629 Unspecified abnormal cytological findings in specimens from vagina: Secondary | ICD-10-CM | POA: Diagnosis present

## 2021-11-18 DIAGNOSIS — Z113 Encounter for screening for infections with a predominantly sexual mode of transmission: Secondary | ICD-10-CM | POA: Diagnosis present

## 2021-11-18 DIAGNOSIS — Z01419 Encounter for gynecological examination (general) (routine) without abnormal findings: Secondary | ICD-10-CM | POA: Diagnosis present

## 2021-11-18 DIAGNOSIS — Z9189 Other specified personal risk factors, not elsewhere classified: Secondary | ICD-10-CM

## 2021-11-18 DIAGNOSIS — Z114 Encounter for screening for human immunodeficiency virus [HIV]: Secondary | ICD-10-CM

## 2021-11-18 MED ORDER — ESTRADIOL 0.5 MG PO TABS: 0.5000 mg | ORAL_TABLET | Freq: Every day | ORAL | 1 refills | Status: DC

## 2021-11-19 ENCOUNTER — Encounter: Payer: Self-pay | Admitting: Internal Medicine

## 2021-11-19 LAB — HIV ANTIBODY (ROUTINE TESTING W REFLEX): HIV 1&2 Ab, 4th Generation: NONREACTIVE

## 2021-11-19 LAB — RPR: RPR Ser Ql: NONREACTIVE

## 2021-11-20 ENCOUNTER — Encounter: Payer: Self-pay | Admitting: Internal Medicine

## 2021-11-24 ENCOUNTER — Other Ambulatory Visit: Payer: Self-pay

## 2021-11-24 DIAGNOSIS — R87612 Low grade squamous intraepithelial lesion on cytologic smear of cervix (LGSIL): Secondary | ICD-10-CM

## 2021-11-24 LAB — CYTOLOGY - PAP
Chlamydia: NEGATIVE
Comment: NEGATIVE
Comment: NEGATIVE
Comment: NORMAL
Neisseria Gonorrhea: NEGATIVE
Trichomonas: NEGATIVE

## 2021-12-02 ENCOUNTER — Encounter: Payer: Self-pay | Admitting: Internal Medicine

## 2021-12-05 ENCOUNTER — Encounter: Payer: Self-pay | Admitting: Internal Medicine

## 2021-12-09 ENCOUNTER — Other Ambulatory Visit: Payer: Self-pay | Admitting: Internal Medicine

## 2021-12-09 MED ORDER — EZETIMIBE 10 MG PO TABS: 10.0000 mg | ORAL_TABLET | Freq: Every day | ORAL | 2 refills | Status: DC

## 2021-12-17 DIAGNOSIS — Z940 Kidney transplant status: Secondary | ICD-10-CM

## 2021-12-17 HISTORY — PX: KIDNEY TRANSPLANT: SHX239

## 2021-12-17 HISTORY — DX: Kidney transplant status: Z94.0

## 2021-12-23 ENCOUNTER — Encounter (INDEPENDENT_AMBULATORY_CARE_PROVIDER_SITE_OTHER): Payer: Self-pay

## 2022-01-02 ENCOUNTER — Other Ambulatory Visit: Payer: Self-pay | Admitting: Internal Medicine

## 2022-01-04 ENCOUNTER — Telehealth: Payer: Self-pay

## 2022-01-04 NOTE — Telephone Encounter (Signed)
Patient had kidney transplant on 8.3.23 and her MD recommended no procedures until 6 months after transplant.  She was scheduled for colposcopy 8/23 with ML but cancelled it.  She questions can this wait 6 months. If absolutely necessary now she wants [place] to explain it to her (and I can do that.) so that she can relay to her MD.  Just wanted to check with you first and see if any other options in light of her situation.

## 2022-01-05 NOTE — Telephone Encounter (Signed)
I would route to Dr. Dellis Filbert for her recommendations.

## 2022-01-06 ENCOUNTER — Ambulatory Visit: Payer: Medicare Other | Admitting: Obstetrics & Gynecology

## 2022-01-07 NOTE — Telephone Encounter (Signed)
  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  Repeat Pap test in 02/2022 and Colpo at the end of 06/2022 (6 months from transplant) if Pap is stable    Left message for patient to call.

## 2022-01-12 NOTE — Telephone Encounter (Signed)
PAPR scheduled for 02/23/22 at 8:30am.

## 2022-01-12 NOTE — Telephone Encounter (Signed)
Spoke with patient and informed her.  Message sent to appt desk to schedule her in October.

## 2022-01-14 ENCOUNTER — Telehealth: Payer: Self-pay | Admitting: Internal Medicine

## 2022-01-14 NOTE — Telephone Encounter (Signed)
Noted! Thank you

## 2022-01-14 NOTE — Telephone Encounter (Signed)
Patient called this morning to say that she was cancelling her 01/19/2022 appointment because she had a kidney transplant and her doctors do not want her to see any other physicians for 6 months.  Patient also stated that her physicians have changed her insulin to Lantus and Humalog.

## 2022-01-18 DIAGNOSIS — T8619 Other complication of kidney transplant: Secondary | ICD-10-CM | POA: Insufficient documentation

## 2022-01-19 ENCOUNTER — Ambulatory Visit: Payer: Medicare Other | Admitting: Internal Medicine

## 2022-01-27 ENCOUNTER — Other Ambulatory Visit: Payer: Self-pay | Admitting: Internal Medicine

## 2022-02-23 ENCOUNTER — Ambulatory Visit: Payer: Medicare Other | Admitting: Obstetrics & Gynecology

## 2022-03-03 ENCOUNTER — Ambulatory Visit: Payer: Medicare Other | Admitting: Obstetrics & Gynecology

## 2022-03-10 ENCOUNTER — Ambulatory Visit: Payer: Medicare Other | Admitting: Internal Medicine

## 2022-03-10 DIAGNOSIS — H43812 Vitreous degeneration, left eye: Secondary | ICD-10-CM | POA: Diagnosis not present

## 2022-03-10 DIAGNOSIS — E113592 Type 2 diabetes mellitus with proliferative diabetic retinopathy without macular edema, left eye: Secondary | ICD-10-CM | POA: Diagnosis not present

## 2022-03-10 DIAGNOSIS — Z9889 Other specified postprocedural states: Secondary | ICD-10-CM | POA: Diagnosis not present

## 2022-03-10 DIAGNOSIS — E113511 Type 2 diabetes mellitus with proliferative diabetic retinopathy with macular edema, right eye: Secondary | ICD-10-CM | POA: Diagnosis not present

## 2022-03-10 DIAGNOSIS — H10413 Chronic giant papillary conjunctivitis, bilateral: Secondary | ICD-10-CM | POA: Diagnosis not present

## 2022-03-10 DIAGNOSIS — H04123 Dry eye syndrome of bilateral lacrimal glands: Secondary | ICD-10-CM | POA: Diagnosis not present

## 2022-03-10 DIAGNOSIS — Z961 Presence of intraocular lens: Secondary | ICD-10-CM | POA: Diagnosis not present

## 2022-03-10 DIAGNOSIS — H43393 Other vitreous opacities, bilateral: Secondary | ICD-10-CM | POA: Diagnosis not present

## 2022-03-11 ENCOUNTER — Encounter: Payer: Self-pay | Admitting: Nurse Practitioner

## 2022-03-11 ENCOUNTER — Ambulatory Visit (INDEPENDENT_AMBULATORY_CARE_PROVIDER_SITE_OTHER): Payer: Medicare Other | Admitting: Nurse Practitioner

## 2022-03-11 ENCOUNTER — Other Ambulatory Visit (HOSPITAL_COMMUNITY)
Admission: RE | Admit: 2022-03-11 | Discharge: 2022-03-11 | Disposition: A | Discharge status: Home / Self Care | Payer: Medicare Other | Source: Ambulatory Visit | Attending: Nurse Practitioner | Admitting: Nurse Practitioner

## 2022-03-11 VITALS — BP 116/68 | HR 93

## 2022-03-11 DIAGNOSIS — R87810 Cervical high risk human papillomavirus (HPV) DNA test positive: Secondary | ICD-10-CM | POA: Diagnosis not present

## 2022-03-11 DIAGNOSIS — N898 Other specified noninflammatory disorders of vagina: Secondary | ICD-10-CM | POA: Diagnosis not present

## 2022-03-11 DIAGNOSIS — Z1151 Encounter for screening for human papillomavirus (HPV): Secondary | ICD-10-CM | POA: Insufficient documentation

## 2022-03-11 DIAGNOSIS — B9689 Other specified bacterial agents as the cause of diseases classified elsewhere: Secondary | ICD-10-CM | POA: Diagnosis not present

## 2022-03-11 DIAGNOSIS — Z01419 Encounter for gynecological examination (general) (routine) without abnormal findings: Secondary | ICD-10-CM | POA: Insufficient documentation

## 2022-03-11 DIAGNOSIS — R87610 Atypical squamous cells of undetermined significance on cytologic smear of cervix (ASC-US): Secondary | ICD-10-CM | POA: Insufficient documentation

## 2022-03-11 DIAGNOSIS — Z8742 Personal history of other diseases of the female genital tract: Secondary | ICD-10-CM | POA: Diagnosis not present

## 2022-03-11 DIAGNOSIS — N760 Acute vaginitis: Secondary | ICD-10-CM | POA: Diagnosis not present

## 2022-03-11 DIAGNOSIS — R87612 Low grade squamous intraepithelial lesion on cytologic smear of cervix (LGSIL): Secondary | ICD-10-CM | POA: Diagnosis not present

## 2022-03-11 LAB — WET PREP FOR TRICH, YEAST, CLUE

## 2022-03-11 MED ORDER — METRONIDAZOLE 0.75 % VA GEL: 1.0000 | Freq: Every day | VAGINAL | 0 refills | Status: AC

## 2022-03-11 MED ORDER — METRONIDAZOLE 500 MG PO TABS: 500.0000 mg | ORAL_TABLET | Freq: Two times a day (BID) | ORAL | 0 refills | Status: DC

## 2022-03-11 NOTE — Progress Notes (Signed)
   Acute Office Visit  Subjective:    Patient ID: Cassandra Campos, female    DOB: 10-06-1970, 51 y.o.   MRN: [id number]   HPI 51 y.o. presents today for intermittent vaginal itching that started 1 week ago. Denies discharge or odor. Did have "boil-like" area on labia that ruptured last week. No issues with that since then. 11/2021 pap LGSIL with colpo recommended. Kidney transplant done in August with recommendations for no procedures for 6 months. Pap repeat recommended by Dr. Dellis Filbert now and colpo in February 2024.    Review of Systems  Constitutional: Negative.   Genitourinary:  Positive for vaginal pain (Itching). Negative for vaginal discharge.       Vaginal odor       Objective:    Physical Exam Constitutional:      Appearance: Normal appearance.  Genitourinary:    General: Normal vulva.     Vagina: Normal.     Cervix: Normal.     BP 116/68   Pulse 93   SpO2 98%  Wt Readings from Last 3 Encounters:  11/18/21 218 lb (98.9 kg)  11/03/21 220 lb 9.6 oz (100.1 kg)  09/09/21 224 lb 12.8 oz (102 kg)        Patient informed chaperone available to be present for breast and/or pelvic exam. Patient has requested no chaperone to be present. Patient has been advised what will be completed during breast and pelvic exam.   Wet prep + clue cells (+ odor)  Assessment & Plan:   Problem List Items Addressed This Visit       Genitourinary   Bacterial vaginosis - Primary   Relevant Medications   mycophenolate (MYFORTIC) 180 MG EC tablet   tacrolimus (PROGRAF) 1 MG capsule   valGANciclovir (VALCYTE) 450 MG tablet   metroNIDAZOLE (METROGEL) 0.75 % vaginal gel   Other Visit Diagnoses     Vaginal itching       Relevant Orders   WET PREP FOR TRICH, YEAST, CLUE   LGSIL on Pap smear of cervix       Relevant Orders   Cytology - PAP( [place])      Plan: Wet prep positive for clue cells - Metrogel 0.75% vaginally nightly x 5 nights. Pap obtained per  recommendations.      Tamela Gammon DNP, 2:28 PM 03/11/2022

## 2022-03-17 LAB — CYTOLOGY - PAP
Comment: NEGATIVE
Diagnosis: UNDETERMINED — AB
High risk HPV: POSITIVE — AB

## 2022-03-18 ENCOUNTER — Telehealth: Payer: Self-pay | Admitting: *Deleted

## 2022-03-18 DIAGNOSIS — R87610 Atypical squamous cells of undetermined significance on cytologic smear of cervix (ASC-US): Secondary | ICD-10-CM

## 2022-03-18 MED ORDER — FLUCONAZOLE 150 MG PO TABS: ORAL_TABLET | ORAL | 0 refills | Status: DC

## 2022-03-18 NOTE — Telephone Encounter (Signed)
Returned call to patient. Message given to patient as seen below from [place], NP. Patient agreeable. Message to appointment desk to schedule colposcopy. Patient advised once colposcopy appointment scheduled to call Dr. Redgie Grayer office and let her know that colposcopy is scheduled. Patient agreeable.   Encounter closed.

## 2022-03-18 NOTE — Telephone Encounter (Signed)
Call to patient. Patient advised of results as seen below from [facility] and patient verbalized understanding. Patient states she spoke with her transplant MD, Dr. Sheppard Coil, who patient states told her she would rather her have this "sooner rather than later." RN advised would need to review with TW as result note states 6 months. Patient agreeable.   Patient requests diflucan be sent to [facility]. Prescription sent for #2, 0RF.  Routing to provider to review and advise.

## 2022-03-18 NOTE — Telephone Encounter (Signed)
Please schedule colpo now since approval given by transplant provider. Thank you.

## 2022-03-18 NOTE — Telephone Encounter (Signed)
-----   Message from Tamela Gammon, NP sent at 03/18/2022  8:07 AM EDT ----- Needs Colpo for ASCUS +HR HPV. Due to transplant in August no procedures for 6 months. Dr. Dellis Filbert aware and recommends Colpo in February. Pap shows yeast. Please send in Diflucan 150 mg #2 to take 3 days apart. Thank you.

## 2022-04-05 ENCOUNTER — Other Ambulatory Visit (HOSPITAL_COMMUNITY): Payer: Self-pay

## 2022-04-28 ENCOUNTER — Other Ambulatory Visit (HOSPITAL_COMMUNITY)
Admission: RE | Admit: 2022-04-28 | Discharge: 2022-04-28 | Disposition: A | Discharge status: Home / Self Care | Payer: Medicare Other | Source: Ambulatory Visit | Attending: Obstetrics and Gynecology | Admitting: Obstetrics and Gynecology

## 2022-04-28 ENCOUNTER — Encounter: Payer: Self-pay | Admitting: Obstetrics and Gynecology

## 2022-04-28 ENCOUNTER — Ambulatory Visit (INDEPENDENT_AMBULATORY_CARE_PROVIDER_SITE_OTHER): Payer: Medicare Other | Admitting: Obstetrics and Gynecology

## 2022-04-28 VITALS — BP 134/80

## 2022-04-28 DIAGNOSIS — Z01812 Encounter for preprocedural laboratory examination: Secondary | ICD-10-CM

## 2022-04-28 DIAGNOSIS — R87620 Atypical squamous cells of undetermined significance on cytologic smear of vagina (ASC-US): Secondary | ICD-10-CM | POA: Insufficient documentation

## 2022-04-28 DIAGNOSIS — R87811 Vaginal high risk human papillomavirus (HPV) DNA test positive: Secondary | ICD-10-CM | POA: Insufficient documentation

## 2022-04-28 LAB — PREGNANCY, URINE: Preg Test, Ur: NEGATIVE
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
## 2022-04-28 NOTE — Patient Instructions (Signed)

## 2022-04-30 LAB — SURGICAL PATHOLOGY

## 2022-07-05 DIAGNOSIS — I151 Hypertension secondary to other renal disorders: Secondary | ICD-10-CM | POA: Diagnosis not present

## 2022-07-05 DIAGNOSIS — Z79621 Long term (current) use of calcineurin inhibitor: Secondary | ICD-10-CM | POA: Diagnosis not present

## 2022-07-05 DIAGNOSIS — B259 Cytomegaloviral disease, unspecified: Secondary | ICD-10-CM | POA: Diagnosis not present

## 2022-07-05 DIAGNOSIS — Z940 Kidney transplant status: Secondary | ICD-10-CM | POA: Diagnosis not present

## 2022-07-05 DIAGNOSIS — Z5181 Encounter for therapeutic drug level monitoring: Secondary | ICD-10-CM | POA: Diagnosis not present

## 2022-07-05 DIAGNOSIS — Z4822 Encounter for aftercare following kidney transplant: Secondary | ICD-10-CM | POA: Diagnosis not present

## 2022-07-05 DIAGNOSIS — E1165 Type 2 diabetes mellitus with hyperglycemia: Secondary | ICD-10-CM | POA: Diagnosis not present

## 2022-07-21 ENCOUNTER — Telehealth: Payer: Self-pay

## 2022-07-21 ENCOUNTER — Other Ambulatory Visit: Payer: Self-pay | Admitting: Nurse Practitioner

## 2022-07-21 DIAGNOSIS — B3731 Acute candidiasis of vulva and vagina: Secondary | ICD-10-CM

## 2022-07-21 MED ORDER — FLUCONAZOLE 150 MG PO TABS: 150.0000 mg | ORAL_TABLET | ORAL | 0 refills | Status: DC

## 2022-07-21 NOTE — Telephone Encounter (Signed)
Patient called in voice mail stating she is prone to yeast infections because of her diabetes. She mentioned she has had kidney transplant.  She said she is requesting Rx because provider knows she is prone.  I called and spoke with her. She is only experiencing internal vaginal itching. No  discharge and no odor. She said this is how it usually presents for her.  She requested short Rx (2 days) as she is taking many other medications.  Pharmacy is set.

## 2022-07-21 NOTE — Telephone Encounter (Signed)
Spoke with patient and informed her. °

## 2022-07-21 NOTE — Telephone Encounter (Signed)
Diflucan 150 mg #2 sent to pharmacy. Thank you.

## 2022-09-02 DIAGNOSIS — R3911 Hesitancy of micturition: Secondary | ICD-10-CM | POA: Diagnosis not present

## 2022-09-02 DIAGNOSIS — I10 Essential (primary) hypertension: Secondary | ICD-10-CM | POA: Diagnosis not present

## 2022-09-02 DIAGNOSIS — Z79621 Long term (current) use of calcineurin inhibitor: Secondary | ICD-10-CM | POA: Diagnosis not present

## 2022-09-02 DIAGNOSIS — E119 Type 2 diabetes mellitus without complications: Secondary | ICD-10-CM | POA: Diagnosis not present

## 2022-09-02 DIAGNOSIS — Z940 Kidney transplant status: Secondary | ICD-10-CM | POA: Diagnosis not present

## 2022-09-02 DIAGNOSIS — Z794 Long term (current) use of insulin: Secondary | ICD-10-CM | POA: Diagnosis not present

## 2022-09-02 DIAGNOSIS — D849 Immunodeficiency, unspecified: Secondary | ICD-10-CM | POA: Diagnosis not present

## 2022-09-02 DIAGNOSIS — Z5181 Encounter for therapeutic drug level monitoring: Secondary | ICD-10-CM | POA: Diagnosis not present

## 2022-09-02 DIAGNOSIS — E785 Hyperlipidemia, unspecified: Secondary | ICD-10-CM | POA: Diagnosis not present

## 2022-09-30 DIAGNOSIS — I120 Hypertensive chronic kidney disease with stage 5 chronic kidney disease or end stage renal disease: Secondary | ICD-10-CM | POA: Diagnosis not present

## 2022-09-30 DIAGNOSIS — E119 Type 2 diabetes mellitus without complications: Secondary | ICD-10-CM | POA: Diagnosis not present

## 2022-09-30 DIAGNOSIS — N186 End stage renal disease: Secondary | ICD-10-CM | POA: Diagnosis not present

## 2022-09-30 DIAGNOSIS — D849 Immunodeficiency, unspecified: Secondary | ICD-10-CM | POA: Diagnosis not present

## 2022-09-30 DIAGNOSIS — Z794 Long term (current) use of insulin: Secondary | ICD-10-CM | POA: Diagnosis not present

## 2022-09-30 DIAGNOSIS — Z940 Kidney transplant status: Secondary | ICD-10-CM | POA: Diagnosis not present

## 2022-09-30 DIAGNOSIS — Z79899 Other long term (current) drug therapy: Secondary | ICD-10-CM | POA: Diagnosis not present

## 2022-10-28 ENCOUNTER — Ambulatory Visit: Payer: Medicare Other

## 2022-11-01 ENCOUNTER — Encounter: Payer: Self-pay | Admitting: Nurse Practitioner

## 2022-11-02 ENCOUNTER — Other Ambulatory Visit: Payer: Self-pay | Admitting: Nurse Practitioner

## 2022-11-02 DIAGNOSIS — B3731 Acute candidiasis of vulva and vagina: Secondary | ICD-10-CM

## 2022-11-02 MED ORDER — FLUCONAZOLE 150 MG PO TABS: 150.0000 mg | ORAL_TABLET | ORAL | 0 refills | Status: DC

## 2022-11-08 ENCOUNTER — Other Ambulatory Visit: Payer: Self-pay | Admitting: Internal Medicine

## 2022-11-08 DIAGNOSIS — Z1231 Encounter for screening mammogram for malignant neoplasm of breast: Secondary | ICD-10-CM

## 2022-11-10 ENCOUNTER — Ambulatory Visit (INDEPENDENT_AMBULATORY_CARE_PROVIDER_SITE_OTHER): Payer: Medicare Other

## 2022-11-10 VITALS — BP 126/64 | HR 90 | Temp 98.3°F | Ht 61.0 in | Wt 215.8 lb

## 2022-11-10 DIAGNOSIS — Z0000 Encounter for general adult medical examination without abnormal findings: Secondary | ICD-10-CM

## 2022-11-10 NOTE — Progress Notes (Signed)
Subjective:   Cassandra Campos is a 52 y.o. female who presents for an Initial Medicare Annual Wellness Visit.  Visit Complete: In person    Review of Systems     Cardiac Risk Factors include: diabetes mellitus;dyslipidemia;hypertension;obesity (BMI >30kg/m2)     Objective:    Today's Vitals   11/10/22 0918  BP: 126/64  Pulse: 90  Temp: 98.3 F (36.8 C)  TempSrc: Oral  SpO2: 94%  Weight: 215 lb 12.8 oz (97.9 kg)  Height: 5\' 1"  (1.549 m)   Body mass index is 40.78 kg/m.     11/10/2022    9:36 AM 12/05/2021    5:26 PM 07/23/2021    9:58 PM 04/01/2021    7:06 AM 12/18/2020   11:32 PM 07/17/2019   10:00 PM 09/20/2018   10:50 PM  Advanced Directives  Does Patient Have a Medical Advance Directive? No Yes No No No No No  Does patient want to make changes to medical advance directive?  Yes (MAU/Ambulatory/Procedural Areas - Information given)       Would patient like information on creating a medical advance directive? No - Patient declined  No - Patient declined No - Patient declined No - Patient declined  No - Patient declined    Current Medications (verified) Outpatient Encounter Medications as of 11/10/2022  Medication Sig   albuterol (VENTOLIN HFA) 108 (90 Base) MCG/ACT inhaler Inhale 2 puffs into the lungs every 6 (six) hours as needed for wheezing or shortness of breath.   allopurinol (ZYLOPRIM) 300 MG tablet TAKE ONE TABLET BY MOUTH EVERY MORNING (Patient taking differently: Take 300 mg by mouth daily.)   aspirin EC 81 MG tablet Take 1 tablet by mouth daily.   Continuous Blood Gluc Receiver (FREESTYLE LIBRE 2 READER) DEVI 1 each by Does not apply route daily.   Continuous Blood Gluc Sensor (FREESTYLE LIBRE 2 SENSOR) MISC USE AS DIRECTED TO test blood sugar, CHANGE EVERY 14 DAYS   furosemide (LASIX) 40 MG tablet daily.   insulin aspart (NOVOLOG) 100 UNIT/ML injection Inject 10 Units into the skin 3 (three) times daily before meals. With sliding scale   Insulin  Glargine (BASAGLAR KWIKPEN [place]) Inject into the skin. 28 units in am and 18 in pm   Insulin Pen Needle 32G X 4 MM MISC Use 2x a day   Microlet Lancets MISC Use as directed to check blood sugars 4 times per day dx: e11.22   mycophenolate (MYFORTIC) 180 MG EC tablet Take by mouth.   NIFEdipine (PROCARDIA-XL/NIFEDICAL-XL) 30 MG 24 hr tablet Take by mouth.   omeprazole (PRILOSEC) 20 MG capsule Take 20 mg by mouth daily.   predniSONE (DELTASONE) 5 MG tablet Take 1 tablet by mouth daily.   rosuvastatin (CRESTOR) 20 MG tablet Take 1 tablet (20 mg total) by mouth daily.   sevelamer carbonate (RENVELA) 800 MG tablet Take 1,600 mg by mouth 3 (three) times daily.   sulfamethoxazole-trimethoprim (BACTRIM) 400-80 MG tablet Take 1 tablet by mouth 3 (three) times a week. Monday, Wednesday, Friday   tacrolimus (PROGRAF) 1 MG capsule Take by mouth.   cloNIDine (CATAPRES) 0.1 MG tablet Take by mouth. (Patient not taking: Reported on 11/10/2022)   fluconazole (DIFLUCAN) 150 MG tablet Take 1 tablet (150 mg total) by mouth every 3 (three) days. (Patient not taking: Reported on 11/10/2022)   HUMALOG KWIKPEN 100 UNIT/ML KwikPen Inject into the skin. (Patient not taking: Reported on 11/10/2022)   LANTUS SOLOSTAR 100 UNIT/ML Solostar Pen Inject into the skin. (  Patient not taking: Reported on 11/10/2022)   pregabalin (LYRICA) 150 MG capsule Take 1 capsule (150 mg total) by mouth 2 (two) times daily.   sodium zirconium cyclosilicate (LOKELMA) 5 g packet Take by mouth. (Patient not taking: Reported on 11/10/2022)   valGANciclovir (VALCYTE) 450 MG tablet Take by mouth. (Patient not taking: Reported on 11/10/2022)   No facility-administered encounter medications on file as of 11/10/2022.    Allergies (verified) Patient has no known allergies.   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   nephrologist-  dr Camille Bal-- scheduled next visit 09-08-2017 (lov 07-05-2017)   Constipation    Diabetic retinopathy (HCC)    Dialysis patient (HCC)    Edema, lower extremity    Elevated transaminase level    Family history of breast cancer    Family history of ovarian cancer    Family history of stomach cancer    Fatty liver    FOLLOWED BY DR Marina Goodell   GERD (gastroesophageal reflux disease)    Hypertension    Idiopathic gout of multiple sites    09-05-2017 per pt stable   Insulin dependent type 2 diabetes mellitus (HCC)    followed by dr Evelene Croon   Joint pain    Mixed hyperlipidemia    OSA (obstructive sleep apnea)    per study 10-20-2015 mild osa w/ AHI 10.3/hr;  09-05-2017 per pt has not used cpap since 1 yr ago   Palpitations    Peripheral neuropathy    feet   Pure hypercholesterolemia 05/21/2021   S/P arteriovenous (AV) fistula creation 07-04-2015  w/ revision 02-14-2017   left braniocephilic   Shortness of breath    Trigger thumb, right thumb    Umbilical hernia    Vitamin D deficiency    Past Surgical History:  Procedure Laterality Date   A/V FISTULAGRAM N/A 04/01/2021   Procedure: A/V FISTULAGRAM;  Surgeon: Victorino Sparrow, MD;  Location: [facility] INVASIVE CV LAB;  Service: Cardiovascular;  Laterality: N/A;   ABDOMINAL HYSTERECTOMY  07/11/2000   dr gott   TAH/BSO for irregular bleeding and leiomyoma   AV FISTULA PLACEMENT Left 07/04/2015   Procedure: ARTERIOVENOUS (AV) FISTULA CREATION-LEFT;  Surgeon: Pryor Ochoa, MD;  Location: [facility] OR;  Service: Vascular;  Laterality: Left;   BREAST EXCISIONAL BIOPSY Right    benign   CARPAL TUNNEL RELEASE Bilateral 1993 and 1994   CESAREAN SECTION  1993:  09-25-1999   BILATERAL TUBAL LIGATION W/ LAST C/S   DILATION AND CURETTAGE OF UTERUS     EXPLORATORY LAPARTOMY / LYSIS ADHESIONS/  BILATERAL SALPINGOOPHORECTOMY  07-20-2011  dr  s. 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   PERIPHERAL VASCULAR BALLOON ANGIOPLASTY  04/01/2021   Procedure: PERIPHERAL VASCULAR BALLOON ANGIOPLASTY;  Surgeon: Victorino Sparrow, MD;  Location: [facility] INVASIVE  CV LAB;  Service: Cardiovascular;;   RETINAL DETACHMENT SURGERY Left 2015   REVISON OF ARTERIOVENOUS FISTULA Left 02/14/2017   Procedure: REVISON OF ARTERIOVENOUS FISTULA  LEFT ARM;  Surgeon: Sherren Kerns, MD;  Location: [facility] OR;  Service: Vascular;  Laterality: Left;   REVISON OF ARTERIOVENOUS FISTULA Left 07/24/2021   Procedure: REVISON OF ARTERIOVENOUS FISTULA ARM;  Surgeon: Chuck Hint, MD;  Location: [facility] OR;  Service: Vascular;  Laterality: Left;   TRIGGER FINGER RELEASE Left 2015   thumb, also done in 2022   TRIGGER FINGER RELEASE Right 09/09/2017   Procedure: RELEASE TRIGGER FINGER/A-1 PULLEY RIGHT THUMB;  Surgeon: Jodi Geralds, MD;  Location: [facility];  Service: Orthopedics;  Laterality: Right;   Family History  Problem Relation Age of Onset   Diabetes Mother    Hypertension Mother    Thyroid disease Mother    Ovarian cancer Mother        dx 77s   Diabetes Father    Hypertension Father    Cancer Father        STOMACH   Stomach cancer Father 92   Breast cancer Maternal Aunt 38   Cancer Paternal Uncle        unknown type, dx >50   Stroke Maternal Grandmother    Stroke Maternal Grandfather    Cancer Paternal Grandfather        unknown type, mets to brain   Cancer Cousin        unknown type, dx 35s, paternal first cousin   Cancer Other         unknown type, father's first cousin   Cancer Other        unknown types, 4 of mother's first cousins   Social History   Socioeconomic History   Marital status: Married    Spouse name: Not on file   Number of children: 2   Years of education: Not on file   Highest education level: Not on file  Occupational History   Occupation: medical billing and insurance  Tobacco Use   Smoking status: Never   Smokeless tobacco: Never  Vaping Use   Vaping Use: Never used  Substance and Sexual Activity   Alcohol use: Yes    Comment: socially   Drug use: No   Sexual activity: Yes    Birth control/protection: Surgical    Comment: HYST-1st intercourse 52 yo-Fewer than 5 partners  Other Topics Concern   Not on file  Social History Narrative   Not on file   Social Determinants of Health   Financial Resource Strain: Low Risk  (11/10/2022)   Overall Financial Resource Strain (CARDIA)    Difficulty of Paying Living Expenses: Not hard at all  Food Insecurity: No Food Insecurity (11/10/2022)   Hunger Vital Sign    Worried About Running Out of Food in the Last Year: Never true    Ran Out of Food in the Last Year: Never true  Transportation Needs: No Transportation Needs (11/10/2022)   PRAPARE - Administrator, Civil Service (Medical): No    Lack of Transportation (Non-Medical): No  Physical Activity: Insufficiently Active (11/10/2022)   Exercise Vital Sign    Days of Exercise per Week: 3 days    Minutes of Exercise per Session: 30 min  Stress: No Stress Concern Present (11/10/2022)   [employer] of Occupational Health - Occupational Stress Questionnaire    Feeling of Stress : Not at all  Social Connections: Moderately Integrated (11/10/2022)  Social Advertising account executive [NHANES]    Frequency of Communication with Friends and Family: More than three times a week    Frequency of Social Gatherings with Friends and Family: Twice a week    Attends Religious  Services: Never    Database administrator or Organizations: Yes    Attends Engineer, structural: More than 4 times per year    Marital Status: Married    Tobacco Counseling Counseling given: Not Answered   Clinical Intake:  Pre-visit preparation completed: Yes  Pain : No/denies pain     Nutritional Status: BMI > 30  Obese Nutritional Risks: None Diabetes: Yes CBG done?: No Did pt. bring in CBG monitor from home?: No  How often do you need to have someone help you when you read instructions, pamphlets, or other written materials from your doctor or pharmacy?: 1 - Never  Interpreter Needed?: No  Information entered by :: NAllen LPN   Activities of Daily Living    11/10/2022    9:24 AM  In your present state of health, do you have any difficulty performing the following activities:  Hearing? 0  Vision? 1  Comment sometimes, going to eye doctor soon to have checked  Difficulty concentrating or making decisions? 0  Walking or climbing stairs? 0  Dressing or bathing? 0  Doing errands, shopping? 0  Preparing Food and eating ? N  Using the Toilet? N  In the past six months, have you accidently leaked urine? N  Do you have problems with loss of bowel control? N  Managing your Medications? N  Managing your Finances? N  Housekeeping or managing your Housekeeping? N    Patient Care Team: Dorothyann Peng, MD as PCP - General (Internal Medicine) Runell Gess, MD as PCP - Cardiology (Cardiology) Camille Bal, MD as Consulting Physician (Nephrology)  Indicate any recent Medical Services you may have received from other than [facility] providers in the past year (date may be approximate).     Assessment:   This is a routine wellness examination for Cassandra Campos.  Hearing/Vision screen Hearing Screening - Comments:: Denies hearing difficulties Vision Screening - Comments:: Regular eye exams, Wears glasses, [facility], Dr. Allena Katz  Dietary issues and exercise  activities discussed:     Goals Addressed             This Visit's Progress    Patient Stated       11/10/2022, wants to lose more weight to get off insulin       Depression Screen    11/10/2022    9:39 AM 11/03/2021    2:58 PM 02/02/2021    2:42 PM 08/25/2020    3:03 PM 10/30/2019    4:49 PM 08/30/2019   10:44 AM 01/18/2019    4:51 PM  PHQ 2/9 Scores  PHQ - 2 Score 0 0 0 0 1 3 0  PHQ- 9 Score 3     18     Fall Risk    11/10/2022    9:37 AM 11/03/2021    3:02 PM 01/18/2019    4:48 PM 11/09/2018    8:40 AM 09/26/2018    1:58 PM  Fall Risk   Falls in the past year? 1 0 0 0 0  Comment lost balance fell of step      Number falls in past yr: 0 0     Injury with Fall? 0 0     Risk for fall due to :  Medication side effect No Fall Risks     Follow up Falls prevention discussed;Falls evaluation completed Falls evaluation completed       MEDICARE RISK AT HOME:  Medicare Risk at Home - 11/10/22 0938     Any stairs in or around the home? Yes    If so, are there any without handrails? Yes    Home free of loose throw rugs in walkways, pet beds, electrical cords, etc? Yes    Adequate lighting in your home to reduce risk of falls? Yes    Life alert? No    Use of a cane, walker or w/c? No    Grab bars in the bathroom? No    Shower chair or bench in shower? No    Elevated toilet seat or a handicapped toilet? Yes             TIMED UP AND GO:  Was the test performed? Yes  Length of time to ambulate 10 feet: 5 sec Gait steady and fast without use of assistive device    Cognitive Function:        11/10/2022    9:41 AM 11/03/2021    3:03 PM 02/02/2021    2:12 PM  6CIT Screen  What Year? 0 points 0 points 0 points  What month? 0 points  0 points  What time? 0 points 0 points 0 points  Count back from 20 0 points 0 points 0 points  Months in reverse 0 points 0 points 0 points  Repeat phrase 2 points 0 points 10 points  Total Score 2 points  10 points     Immunizations Immunization History  Administered Date(s) Administered   Hepatitis B 02/25/2017, 12/29/2017   Hepatitis B, ADULT 02/25/2017, 12/29/2017   Influenza, Seasonal, Injecte, Preservative Fre 01/30/2018   Influenza,inj,Quad PF,6+ Mos 02/19/2021   Influenza-Unspecified 02/28/2018, 01/30/2020   PFIZER(Purple Top)SARS-COV-2 Vaccination 07/27/2019, 08/23/2019, 02/13/2020   Tdap 04/19/2019    TDAP status: Up to date  Flu Vaccine status: Up to date  Pneumococcal vaccine status: Up to date  Covid-19 vaccine status: Information provided on how to obtain vaccines.   Qualifies for Shingles Vaccine? Yes   Zostavax completed No   Shingrix Completed?: No.    Education has been provided regarding the importance of this vaccine. Patient has been advised to call insurance company to determine out of pocket expense if they have not yet received this vaccine. Advised may also receive vaccine at local pharmacy or Health Dept. Verbalized acceptance and understanding.  Screening Tests Health Maintenance  Topic Date Due   Zoster Vaccines- Shingrix (1 of 2) Never done   COVID-19 Vaccine (4 - 2023-24 season) 01/15/2022   OPHTHALMOLOGY EXAM  03/04/2022   HEMOGLOBIN A1C  03/11/2022   FOOT EXAM  09/10/2022   INFLUENZA VACCINE  12/16/2022   PAP SMEAR-Modifier  03/12/2023   MAMMOGRAM  10/17/2023   Medicare Annual Wellness (AWV)  11/10/2023   Colonoscopy  03/25/2027   DTaP/Tdap/Td (2 - Td or Tdap) 04/18/2029   Hepatitis C Screening  Completed   HIV Screening  Completed   HPV VACCINES  Aged Out    Health Maintenance  Health Maintenance Due  Topic Date Due   Zoster Vaccines- Shingrix (1 of 2) Never done   COVID-19 Vaccine (4 - 2023-24 season) 01/15/2022   OPHTHALMOLOGY EXAM  03/04/2022   HEMOGLOBIN A1C  03/11/2022   FOOT EXAM  09/10/2022    Colorectal cancer screening: Type of screening: Colonoscopy. Completed 03/24/2017. Repeat  every 10 years  Mammogram status: scheduled for  11/11/2022  Bone Density status: n/a  Lung Cancer Screening: (Low Dose CT Chest recommended if Age 60-80 years, 20 pack-year currently smoking OR have quit w/in 15years.) does not qualify.   Lung Cancer Screening Referral: no  Additional Screening:  Hepatitis C Screening: does qualify; Completed 07/10/2012  Vision Screening: Recommended annual ophthalmology exams for early detection of glaucoma and other disorders of the eye. Is the patient up to date with their annual eye exam?  Yes  Who is the provider or what is the name of the office in which the patient attends annual eye exams? [facility] If pt is not established with a provider, would they like to be referred to a provider to establish care? No .   Dental Screening: Recommended annual dental exams for proper oral hygiene  Diabetic Foot Exam: Diabetic Foot Exam: Overdue, Pt has been advised about the importance in completing this exam. Pt is scheduled for diabetic foot exam on next appointment.  Community Resource Referral / Chronic Care Management: CRR required this visit?  No   CCM required this visit?  No     Plan:     I have personally reviewed and noted the following in the patient's chart:   Medical and social history Use of alcohol, tobacco or illicit drugs  Current medications and supplements including opioid prescriptions. Patient is not currently taking opioid prescriptions. Functional ability and status Nutritional status Physical activity Advanced directives List of other physicians Hospitalizations, surgeries, and ER visits in previous 12 months Vitals Screenings to include cognitive, depression, and falls Referrals and appointments  In addition, I have reviewed and discussed with patient certain preventive protocols, quality metrics, and best practice recommendations. A written personalized care plan for preventive services as well as general preventive health recommendations were provided to  patient.     Barb Merino, LPN   06/30/863   After Visit Summary: in person  Nurse Notes: none

## 2022-11-10 NOTE — Patient Instructions (Addendum)
Cassandra Campos , Thank you for taking time to come for your Medicare Wellness Visit. I appreciate your ongoing commitment to your health goals. Please review the following plan we discussed and let me know if I can assist you in the future.   These are the goals we discussed:  Goals      Increase physical activity     Patient Stated     11/10/2022, wants to lose more weight to get off insulin        This is a list of the screening recommended for you and due dates:  Health Maintenance  Topic Date Due   Zoster (Shingles) Vaccine (1 of 2) Never done   Eye exam for diabetics  03/04/2022   Hemoglobin A1C  03/11/2022   Complete foot exam   09/10/2022   COVID-19 Vaccine (4 - 2023-24 season) 11/26/2022*   Flu Shot  12/16/2022   Pap Smear  03/12/2023   Mammogram  10/17/2023   Medicare Annual Wellness Visit  11/10/2023   Colon Cancer Screening  03/25/2027   DTaP/Tdap/Td vaccine (2 - Td or Tdap) 04/18/2029   Hepatitis C Screening  Completed   HIV Screening  Completed   HPV Vaccine  Aged Out  *Topic was postponed. The date shown is not the original due date.    Advanced directives: Advance directive discussed with you today. Even though you declined this today please call our office should you change your mind and we can give you the proper paperwork for you to fill out.  Conditions/risks identified: none  Next appointment: Follow up in one year for your annual wellness visit.   Preventive Care 40-64 Years, Female Preventive care refers to lifestyle choices and visits with your health care provider that can promote health and wellness. What does preventive care include? A yearly physical exam. This is also called an annual well check. Dental exams once or twice a year. Routine eye exams. Ask your health care provider how often you should have your eyes checked. Personal lifestyle choices, including: Daily care of your teeth and gums. Regular physical activity. Eating a healthy  diet. Avoiding tobacco and drug use. Limiting alcohol use. Practicing safe sex. Taking low-dose aspirin daily starting at age 72. Taking vitamin and mineral supplements as recommended by your health care provider. What happens during an annual well check? The services and screenings done by your health care provider during your annual well check will depend on your age, overall health, lifestyle risk factors, and family history of disease. Counseling  Your health care provider may ask you questions about your: Alcohol use. Tobacco use. Drug use. Emotional well-being. Home and relationship well-being. Sexual activity. Eating habits. Work and work [employer]. Method of birth control. Menstrual cycle. Pregnancy history. Screening  You may have the following tests or measurements: Height, weight, and BMI. Blood pressure. Lipid and cholesterol levels. These may be checked every 5 years, or more frequently if you are over 65 years old. Skin check. Lung cancer screening. You may have this screening every year starting at age 75 if you have a 30-pack-year history of smoking and currently smoke or have quit within the past 15 years. Fecal occult blood test (FOBT) of the stool. You may have this test every year starting at age 36. Flexible sigmoidoscopy or colonoscopy. You may have a sigmoidoscopy every 5 years or a colonoscopy every 10 years starting at age 52. Hepatitis C blood test. Hepatitis B blood test. Sexually transmitted disease (STD) testing. Diabetes  screening. This is done by checking your blood sugar (glucose) after you have not eaten for a while (fasting). You may have this done every 1-3 years. Mammogram. This may be done every 1-2 years. Talk to your health care provider about when you should start having regular mammograms. This may depend on whether you have a family history of breast cancer. BRCA-related cancer screening. This may be done if you have a family history of  breast, ovarian, tubal, or peritoneal cancers. Pelvic exam and Pap test. This may be done every 3 years starting at age 34. Starting at age 70, this may be done every 5 years if you have a Pap test in combination with an HPV test. Bone density scan. This is done to screen for osteoporosis. You may have this scan if you are at high risk for osteoporosis. Discuss your test results, treatment options, and if necessary, the need for more tests with your health care provider. Vaccines  Your health care provider may recommend certain vaccines, such as: Influenza vaccine. This is recommended every year. Tetanus, diphtheria, and acellular pertussis (Tdap, Td) vaccine. You may need a Td booster every 10 years. Zoster vaccine. You may need this after age 51. Pneumococcal 13-valent conjugate (PCV13) vaccine. You may need this if you have certain conditions and were not previously vaccinated. Pneumococcal polysaccharide (PPSV23) vaccine. You may need one or two doses if you smoke cigarettes or if you have certain conditions. Talk to your health care provider about which screenings and vaccines you need and how often you need them. This information is not intended to replace advice given to you by your health care provider. Make sure you discuss any questions you have with your health care provider. Document Released: 05/30/2015 Document Revised: 01/21/2016 Document Reviewed: 03/04/2015 Elsevier Interactive Patient Education  2017 [employer] Prevention in the Home Falls can cause injuries. They can happen to people of all ages. There are many things you can do to make your home safe and to help prevent falls. What can I do on the outside of my home? Regularly fix the edges of walkways and driveways and fix any cracks. Remove anything that might make you trip as you walk through a door, such as a raised step or threshold. Trim any bushes or trees on the path to your home. Use bright outdoor  lighting. Clear any walking paths of anything that might make someone trip, such as rocks or tools. Regularly check to see if handrails are loose or broken. Make sure that both sides of any steps have handrails. Any raised decks and porches should have guardrails on the edges. Have any leaves, snow, or ice cleared regularly. Use sand or salt on walking paths during winter. Clean up any spills in your garage right away. This includes oil or grease spills. What can I do in the bathroom? Use night lights. Install grab bars by the toilet and in the tub and shower. Do not use towel bars as grab bars. Use non-skid mats or decals in the tub or shower. If you need to sit down in the shower, use a plastic, non-slip stool. Keep the floor dry. Clean up any water that spills on the floor as soon as it happens. Remove soap buildup in the tub or shower regularly. Attach bath mats securely with double-sided non-slip rug tape. Do not have throw rugs and other things on the floor that can make you trip. What can I do in the  bedroom? Use night lights. Make sure that you have a light by your bed that is easy to reach. Do not use any sheets or blankets that are too big for your bed. They should not hang down onto the floor. Have a firm chair that has side arms. You can use this for support while you get dressed. Do not have throw rugs and other things on the floor that can make you trip. What can I do in the kitchen? Clean up any spills right away. Avoid walking on wet floors. Keep items that you use a lot in easy-to-reach places. If you need to reach something above you, use a strong step stool that has a grab bar. Keep electrical cords out of the way. Do not use floor polish or wax that makes floors slippery. If you must use wax, use non-skid floor wax. Do not have throw rugs and other things on the floor that can make you trip. What can I do with my stairs? Do not leave any items on the stairs. Make  sure that there are handrails on both sides of the stairs and use them. Fix handrails that are broken or loose. Make sure that handrails are as long as the stairways. Check any carpeting to make sure that it is firmly attached to the stairs. Fix any carpet that is loose or worn. Avoid having throw rugs at the top or bottom of the stairs. If you do have throw rugs, attach them to the floor with carpet tape. Make sure that you have a light switch at the top of the stairs and the bottom of the stairs. If you do not have them, ask someone to add them for you. What else can I do to help prevent falls? Wear shoes that: Do not have high heels. Have rubber bottoms. Are comfortable and fit you well. Are closed at the toe. Do not wear sandals. If you use a stepladder: Make sure that it is fully opened. Do not climb a closed stepladder. Make sure that both sides of the stepladder are locked into place. Ask someone to hold it for you, if possible. Clearly mark and make sure that you can see: Any grab bars or handrails. First and last steps. Where the edge of each step is. Use tools that help you move around (mobility aids) if they are needed. These include: Canes. Walkers. Scooters. Crutches. Turn on the lights when you go into a dark area. Replace any light bulbs as soon as they burn out. Set up your furniture so you have a clear path. Avoid moving your furniture around. If any of your floors are uneven, fix them. If there are any pets around you, be aware of where they are. Review your medicines with your doctor. Some medicines can make you feel dizzy. This can increase your chance of falling. Ask your doctor what other things that you can do to help prevent falls. This information is not intended to replace advice given to you by your health care provider. Make sure you discuss any questions you have with your health care provider. Document Released: 02/27/2009 Document Revised: 10/09/2015  Document Reviewed: 06/07/2014 Elsevier Interactive Patient Education  2017 [employer].

## 2022-11-11 ENCOUNTER — Ambulatory Visit
Admission: RE | Admit: 2022-11-11 | Discharge: 2022-11-11 | Disposition: A | Discharge status: Home / Self Care | Payer: Medicare Other | Source: Ambulatory Visit | Attending: Internal Medicine | Admitting: Internal Medicine

## 2022-11-11 DIAGNOSIS — Z1231 Encounter for screening mammogram for malignant neoplasm of breast: Secondary | ICD-10-CM

## 2022-11-15 LAB — HM MAMMOGRAPHY: HM Mammogram: NORMAL (ref 0–4)

## 2022-11-17 ENCOUNTER — Ambulatory Visit: Payer: Medicare Other | Admitting: Internal Medicine

## 2022-11-17 ENCOUNTER — Encounter: Payer: Self-pay | Admitting: Internal Medicine

## 2022-11-17 VITALS — BP 122/74 | HR 88 | Temp 97.9°F | Ht 61.0 in | Wt 212.4 lb

## 2022-11-17 DIAGNOSIS — Z992 Dependence on renal dialysis: Secondary | ICD-10-CM

## 2022-11-17 DIAGNOSIS — R002 Palpitations: Secondary | ICD-10-CM | POA: Diagnosis not present

## 2022-11-17 DIAGNOSIS — E1122 Type 2 diabetes mellitus with diabetic chronic kidney disease: Secondary | ICD-10-CM

## 2022-11-17 DIAGNOSIS — Z2821 Immunization not carried out because of patient refusal: Secondary | ICD-10-CM

## 2022-11-17 DIAGNOSIS — N186 End stage renal disease: Secondary | ICD-10-CM | POA: Diagnosis not present

## 2022-11-17 DIAGNOSIS — Z6841 Body Mass Index (BMI) 40.0 and over, adult: Secondary | ICD-10-CM

## 2022-11-17 DIAGNOSIS — Z0000 Encounter for general adult medical examination without abnormal findings: Secondary | ICD-10-CM

## 2022-11-17 DIAGNOSIS — Z940 Kidney transplant status: Secondary | ICD-10-CM

## 2022-11-17 DIAGNOSIS — Z794 Long term (current) use of insulin: Secondary | ICD-10-CM

## 2022-11-17 DIAGNOSIS — I120 Hypertensive chronic kidney disease with stage 5 chronic kidney disease or end stage renal disease: Secondary | ICD-10-CM | POA: Diagnosis not present

## 2022-11-17 LAB — POCT URINALYSIS DIPSTICK
Bilirubin, UA: NEGATIVE
Glucose, UA: NEGATIVE
Ketones, UA: NEGATIVE
Nitrite, UA: NEGATIVE
Protein, UA: NEGATIVE
Spec Grav, UA: 1.02 (ref 1.010–1.025)
Urobilinogen, UA: 0.2 E.U./dL
pH, UA: 5.5 (ref 5.0–8.0)

## 2022-11-17 NOTE — Progress Notes (Unsigned)
I,Victoria T Deloria Lair, CMA,acting as a Neurosurgeon for Gwynneth Aliment, MD.,have documented all relevant documentation on the behalf of Gwynneth Aliment, MD,as directed by  Gwynneth Aliment, MD while in the presence of Gwynneth Aliment, MD.  Subjective:  Patient ID: Cassandra Campos , female    DOB: 03-29-1971 , 52 y.o.   MRN: [id number]  Chief Complaint  Patient presents with   Annual Exam   Diabetes   Hypertension    HPI  Patient presents today for annual exam. She reports compliance with medications. Denies headache, chest pain, and SOB. GYN: Wyline Beady. She was last seen Summer 2023. Patient reports upcoming appointment in August.    Diabetes She presents for her follow-up diabetic visit. She has type 2 diabetes mellitus. Her disease course has been stable. There are no hypoglycemic associated symptoms. Pertinent negatives for diabetes include no blurred vision and no chest pain. There are no hypoglycemic complications. Diabetic complications include nephropathy, peripheral neuropathy and retinopathy. Risk factors for coronary artery disease include dyslipidemia, hypertension, diabetes mellitus, post-menopausal, sedentary lifestyle and obesity. She is compliant with treatment some of the time. She is following a diabetic diet. She has not had a previous visit with a dietitian. She participates in exercise intermittently. Her home blood glucose trend is fluctuating minimally. Her breakfast blood glucose is taken between 9-10 am. Her breakfast blood glucose range is generally 140-180 mg/dl. Eye exam is current.  Hypertension This is a chronic problem. The current episode started more than 1 year ago. The problem has been gradually improving since onset. The problem is controlled. Pertinent negatives include no blurred vision, chest pain or shortness of breath. Palpitations: she c/o palpitations. recurrent. no associated chest pain. random. occurs at rest. .The current treatment provides  moderate improvement. Hypertensive end-organ damage includes retinopathy. Identifiable causes of hypertension include renovascular disease.     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   nephrologist-  dr Camille Bal-- scheduled next visit 09-08-2017 (lov 07-05-2017)   Constipation    Diabetic retinopathy (HCC)    Dialysis patient (HCC)    Edema, lower extremity    Elevated transaminase level    Family history of breast cancer    Family history of ovarian cancer    Family history of stomach cancer    Fatty liver    FOLLOWED BY DR Marina Goodell   GERD (gastroesophageal reflux disease)    Hypertension    Idiopathic gout of multiple sites    09-05-2017 per pt stable   Insulin dependent type 2 diabetes mellitus (HCC)    followed by dr Evelene Croon   Joint pain    Kidney transplant recipient 12/17/2021   [facility]   Mixed hyperlipidemia    OSA (obstructive sleep apnea)    per study 10-20-2015 mild osa w/ AHI 10.3/hr;  09-05-2017 per pt has not used cpap since 1 yr ago   Palpitations    Peripheral neuropathy    feet   Pure hypercholesterolemia 05/21/2021   S/P arteriovenous (AV) fistula creation 07-04-2015  w/ revision 02-14-2017   left braniocephilic   Shortness of breath    Trigger thumb, right thumb    Umbilical hernia    Vitamin D  deficiency      Family History  Problem Relation Age of Onset   Diabetes Mother    Hypertension Mother    Thyroid disease Mother    Ovarian cancer Mother        dx 48s   Diabetes Father    Hypertension Father    Cancer Father        STOMACH   Stomach cancer Father 57   Breast cancer Maternal Aunt 3   Cancer Paternal Uncle        unknown type, dx >50   Stroke Maternal Grandmother    Stroke Maternal Grandfather     Cancer Paternal Grandfather        unknown type, mets to brain   Cancer Cousin        unknown type, dx 17s, paternal first cousin   Cancer Other        unknown type, father's first cousin   Cancer Other        unknown types, 4 of mother's first cousins     Current Outpatient Medications:    albuterol (VENTOLIN HFA) 108 (90 Base) MCG/ACT inhaler, Inhale 2 puffs into the lungs every 6 (six) hours as needed for wheezing or shortness of breath., Disp: 8 g, Rfl: 2   allopurinol (ZYLOPRIM) 300 MG tablet, TAKE ONE TABLET BY MOUTH EVERY MORNING (Patient taking differently: Take 300 mg by mouth daily.), Disp: 90 tablet, Rfl: 1   aspirin EC 81 MG tablet, Take 1 tablet by mouth daily., Disp: , Rfl:    Continuous Blood Gluc Receiver (FREESTYLE LIBRE 2 READER) DEVI, 1 each by Does not apply route daily., Disp: 1 each, Rfl: 0   Continuous Blood Gluc Sensor (FREESTYLE LIBRE 2 SENSOR) MISC, USE AS DIRECTED TO test blood sugar, CHANGE EVERY 14 DAYS, Disp: 6 each, Rfl: 3   furosemide (LASIX) 40 MG tablet, daily., Disp: , Rfl:    insulin aspart (NOVOLOG) 100 UNIT/ML injection, Inject 10 Units into the skin 3 (three) times daily before meals. With sliding scale, Disp: , Rfl:    Insulin Glargine (BASAGLAR KWIKPEN [place]), Inject into the skin. 28 units in am and 18 in pm, Disp: , Rfl:    Insulin Pen Needle 32G X 4 MM MISC, Use 2x a day, Disp: 200 each, Rfl: 3   Microlet Lancets MISC, Use as directed to check blood sugars 4 times per day dx: e11.22, Disp: 300 each, Rfl: 2   mycophenolate (MYFORTIC) 180 MG EC tablet, Take by mouth., Disp: , Rfl:    NIFEdipine (PROCARDIA-XL/NIFEDICAL-XL) 30 MG 24 hr tablet, Take by mouth., Disp: , Rfl:    omeprazole (PRILOSEC) 20 MG capsule, Take 20 mg by mouth daily., Disp: , Rfl:    predniSONE (DELTASONE) 5 MG tablet, Take 1 tablet by mouth daily., Disp: , Rfl:    rosuvastatin (CRESTOR) 20 MG tablet, Take 1 tablet (20 mg total) by mouth daily., Disp: 90 tablet, Rfl: 3    sevelamer carbonate (RENVELA) 800 MG tablet, Take 1,600 mg by mouth 3 (three) times daily., Disp: , Rfl:    sulfamethoxazole-trimethoprim (BACTRIM) 400-80 MG tablet, Take 1 tablet by mouth 3 (three) times a week. Monday, Wednesday, Friday, Disp: , Rfl:    tacrolimus (PROGRAF) 1 MG capsule, Take by mouth., Disp: , Rfl:    fluconazole (DIFLUCAN) 150 MG tablet, Take 1 tablet (150 mg total) by mouth every 3 (three) days. (Patient not taking: Reported on 11/10/2022), Disp: 2 tablet, Rfl: 0   HUMALOG KWIKPEN 100 UNIT/ML  KwikPen, Inject into the skin. (Patient not taking: Reported on 11/10/2022), Disp: , Rfl:    LANTUS SOLOSTAR 100 UNIT/ML Solostar Pen, Inject into the skin. (Patient not taking: Reported on 11/10/2022), Disp: , Rfl:    pregabalin (LYRICA) 150 MG capsule, One capsule po daily, Disp: 90 capsule, Rfl: 1   sodium zirconium cyclosilicate (LOKELMA) 5 g packet, Take by mouth. (Patient not taking: Reported on 11/10/2022), Disp: , Rfl:    valGANciclovir (VALCYTE) 450 MG tablet, Take by mouth. (Patient not taking: Reported on 11/10/2022), Disp: , Rfl:    No Known Allergies    The patient states she uses status post hysterectomy for birth control. Last LMP was No LMP recorded. Patient has had a hysterectomy.Negative for: breast discharge, breast lump(s), breast pain and breast self exam. Associated symptoms include abnormal vaginal bleeding. Pertinent negatives include abnormal bleeding (hematology), anxiety, decreased libido, depression, difficulty falling sleep, dyspareunia, history of infertility, nocturia, sexual dysfunction, sleep disturbances, urinary incontinence, urinary urgency, vaginal discharge and vaginal itching. Diet regular.The patient states her exercise level is    . The patient's tobacco use is:  Social History   Tobacco Use  Smoking Status Never  Smokeless Tobacco Never  . She has been exposed to passive smoke. The patient's alcohol use is:  Social History   Substance and Sexual  Activity  Alcohol Use Yes   Comment: socially    Review of Systems  Constitutional: Negative.   HENT: Negative.    Eyes: Negative.  Negative for blurred vision.  Respiratory: Negative.  Negative for shortness of breath.   Cardiovascular: Negative.  Negative for chest pain. Palpitations: she c/o palpitations. recurrent. no associated chest pain. random. occurs at rest. . Gastrointestinal: Negative.   Endocrine: Negative.   Genitourinary: Negative.   Musculoskeletal: Negative.   Skin: Negative.   Allergic/Immunologic: Negative.   Neurological: Negative.   Hematological: Negative.   Psychiatric/Behavioral: Negative.       Today's Vitals   11/17/22 0856  BP: 122/74  Pulse: 88  Temp: 97.9 F (36.6 C)  SpO2: 98%  Weight: 212 lb 6.4 oz (96.3 kg)  Height: 5\' 1"  (1.549 m)   Body mass index is 40.13 kg/m.  Wt Readings from Last 3 Encounters:  11/17/22 212 lb 6.4 oz (96.3 kg)  11/10/22 215 lb 12.8 oz (97.9 kg)  11/18/21 218 lb (98.9 kg)     Objective:  Physical Exam Vitals and nursing note reviewed.  Constitutional:      Appearance: Normal appearance. She is obese.  HENT:     Head: Normocephalic and atraumatic.     Right Ear: Tympanic membrane, ear canal and external ear normal.     Left Ear: Tympanic membrane, ear canal and external ear normal.     Nose: Nose normal.     Mouth/Throat:     Mouth: Mucous membranes are moist.     Pharynx: Oropharynx is clear.  Eyes:     Extraocular Movements: Extraocular movements intact.     Conjunctiva/sclera: Conjunctivae normal.     Pupils: Pupils are equal, round, and reactive to light.  Cardiovascular:     Rate and Rhythm: Normal rate and regular rhythm.     Pulses:          Dorsalis pedis pulses are 1+ on the right side and 1+ on the left side.     Heart sounds: Normal heart sounds.  Pulmonary:     Effort: Pulmonary effort is normal.     Breath sounds: Normal breath sounds.  Chest:  Breasts:    Tanner Score is 5.      Right: Normal.     Left: Normal.  Abdominal:     General: Bowel sounds are normal.     Palpations: Abdomen is soft.     Comments: RLQ healed surgical scar  Genitourinary:    Comments: deferred Musculoskeletal:        General: Normal range of motion.     Cervical back: Normal range of motion and neck supple.  Feet:     Right foot:     Protective Sensation: 5 sites tested.  5 sites sensed.     Skin integrity: Dry skin present.     Left foot:     Protective Sensation: 5 sites tested.  5 sites sensed.     Skin integrity: Dry skin present.  Skin:    General: Skin is warm and dry.  Neurological:     General: No focal deficit present.     Mental Status: She is alert and oriented to person, place, and time.  Psychiatric:        Mood and Affect: Mood normal.        Behavior: Behavior normal.         Assessment And Plan:  Encounter for annual health examination Assessment & Plan: A full exam was performed. Importance of monthly self breast exams was discussed with the patient. PATIENT IS ADVISED TO GET 30-45 MINUTES REGULAR EXERCISE NO LESS THAN FOUR TO FIVE DAYS PER WEEK - BOTH WEIGHTBEARING EXERCISES AND AEROBIC ARE RECOMMENDED.  PATIENT IS ADVISED TO FOLLOW A HEALTHY DIET WITH AT LEAST SIX FRUITS/VEGGIES PER DAY, DECREASE INTAKE OF RED MEAT, AND TO INCREASE FISH INTAKE TO TWO DAYS PER WEEK.  MEATS/FISH SHOULD NOT BE FRIED, BAKED OR BROILED IS PREFERABLE.  IT IS ALSO IMPORTANT TO CUT BACK ON YOUR SUGAR INTAKE. PLEASE AVOID ANYTHING WITH ADDED SUGAR, CORN SYRUP OR OTHER SWEETENERS. IF YOU MUST USE A SWEETENER, YOU CAN TRY STEVIA. IT IS ALSO IMPORTANT TO AVOID ARTIFICIALLY SWEETENERS AND DIET BEVERAGES. LASTLY, I SUGGEST WEARING SPF 50 SUNSCREEN ON EXPOSED PARTS AND ESPECIALLY WHEN IN THE DIRECT SUNLIGHT FOR AN EXTENDED PERIOD OF TIME.  PLEASE AVOID FAST FOOD RESTAURANTS AND INCREASE YOUR WATER INTAKE.    Type 2 diabetes mellitus with chronic kidney disease on chronic dialysis, with  long-term current use of insulin (HCC) Assessment & Plan: Chronic, diabetic foot exam was performed. She is currently being managed by Transplant team at [facility].  I DISCUSSED WITH THE PATIENT AT LENGTH REGARDING THE GOALS OF GLYCEMIC CONTROL AND POSSIBLE LONG-TERM COMPLICATIONS.  I  ALSO STRESSED THE IMPORTANCE OF COMPLIANCE WITH HOME GLUCOSE MONITORING, DIETARY RESTRICTIONS INCLUDING AVOIDANCE OF SUGARY DRINKS/PROCESSED FOODS,  ALONG WITH REGULAR EXERCISE.  I  ALSO STRESSED THE IMPORTANCE OF ANNUAL EYE EXAMS, SELF FOOT CARE AND COMPLIANCE WITH OFFICE VISITS.   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 EKG performed, NSR w/ poor R-wave progression, nonspecific T-abnormality and low voltage with rightward P-axis and rotation -possible pulmonary disease.  She will c/w furosemide 40mg  daily and nifeeipine XL 30mg   daily. She is encouraged to follow a low sodium diet. She will f/u in six months.   Orders: -     POCT urinalysis dipstick -     Microalbumin / creatinine urine ratio -     EKG 12-Lead -     CBC -     CMP14+EGFR -     Lipid panel -     TSH  Palpitations Assessment & Plan: EKG performed, she is in sinus rhythm. She has been evaluated by Cardiology in the past. I will check electrolytes today. She is encouraged to stay well hydrated. If persistent, will refer her for Zio monitor.    Class 3 severe obesity due to excess calories with serious comorbidity and body mass index (BMI) of 40.0 to 44.9 in adult [facility]) Assessment & Plan: BMI 40. She was congratulated on her 3 pound weight loss in the past month. She is encouraged to strive for BMI less than 30 to decrease cardiac risk. Advised to aim for at  least 150 minutes of exercise per week.    Kidney transplant recipient  Herpes zoster vaccination declined     Return in about 6 months (around 05/20/2023), or dm/bp, for 1 year physical.  Patient was given opportunity to ask questions. Patient verbalized understanding of the plan and was able to repeat key elements of the plan. All questions were answered to their satisfaction.   I, Gwynneth Aliment, MD, have reviewed all documentation for this visit. The documentation on 11/17/22 for the exam, diagnosis, procedures, and orders are all accurate and complete.   IF YOU HAVE BEEN REFERRED TO A SPECIALIST, IT MAY TAKE 1-2 WEEKS TO SCHEDULE/PROCESS THE REFERRAL. IF YOU HAVE NOT HEARD FROM US/SPECIALIST IN TWO WEEKS, PLEASE GIVE [place] A CALL AT [phone].   THE PATIENT IS ENCOURAGED TO PRACTICE SOCIAL DISTANCING DUE TO THE COVID-19 PANDEMIC.

## 2022-11-17 NOTE — Patient Instructions (Signed)

## 2022-11-19 LAB — CBC
Hematocrit: 37.9 % (ref 34.0–46.6)
Hemoglobin: 12 g/dL (ref 11.1–15.9)
MCH: 26.9 pg (ref 26.6–33.0)
MCHC: 31.7 g/dL (ref 31.5–35.7)
MCV: 85 fL (ref 79–97)
Platelets: 166 10*3/uL (ref 150–450)
RBC: 4.46 x10E6/uL (ref 3.77–5.28)
RDW: 15.5 % — ABNORMAL HIGH (ref 11.7–15.4)
WBC: 2.8 10*3/uL — ABNORMAL LOW (ref 3.4–10.8)

## 2022-11-19 LAB — MICROALBUMIN / CREATININE URINE RATIO
Creatinine, Urine: 40.6 mg/dL
Microalb/Creat Ratio: 52 mg/g creat — ABNORMAL HIGH (ref 0–29)
Microalbumin, Urine: 21.1 ug/mL

## 2022-11-19 LAB — LIPID PANEL
Chol/HDL Ratio: 3.6 ratio (ref 0.0–4.4)
Cholesterol, Total: 173 mg/dL (ref 100–199)
HDL: 48 mg/dL (ref >39)
LDL Chol Calc (NIH): 98 mg/dL (ref 0–99)
Triglycerides: 157 mg/dL — ABNORMAL HIGH (ref 0–149)
VLDL Cholesterol Cal: 27 mg/dL (ref 5–40)

## 2022-11-19 LAB — HEMOGLOBIN A1C
Est. average glucose Bld gHb Est-mCnc: 157 mg/dL
Hgb A1c MFr Bld: 7.1 % — ABNORMAL HIGH (ref 4.8–5.6)

## 2022-11-19 LAB — CMP14+EGFR
ALT: 14 IU/L (ref 0–32)
AST: 18 IU/L (ref 0–40)
Albumin: 4.5 g/dL (ref 3.8–4.9)
Alkaline Phosphatase: 126 IU/L — ABNORMAL HIGH (ref 44–121)
BUN/Creatinine Ratio: 13 (ref 9–23)
BUN: 27 mg/dL — ABNORMAL HIGH (ref 6–24)
Bilirubin Total: 0.4 mg/dL (ref 0.0–1.2)
CO2: 21 mmol/L (ref 20–29)
Calcium: 9.7 mg/dL (ref 8.7–10.2)
Chloride: 105 mmol/L (ref 96–106)
Creatinine, Ser: 2.1 mg/dL — ABNORMAL HIGH (ref 0.57–1.00)
Globulin, Total: 2.5 g/dL (ref 1.5–4.5)
Glucose: 90 mg/dL (ref 70–99)
Potassium: 3.4 mmol/L — ABNORMAL LOW (ref 3.5–5.2)
Sodium: 145 mmol/L — ABNORMAL HIGH (ref 134–144)
Total Protein: 7 g/dL (ref 6.0–8.5)
eGFR: 28 mL/min/{1.73_m2} — ABNORMAL LOW (ref >59)

## 2022-11-19 LAB — TSH: TSH: 2.11 u[IU]/mL (ref 0.450–4.500)

## 2022-11-28 ENCOUNTER — Other Ambulatory Visit: Payer: Self-pay | Admitting: Internal Medicine

## 2022-12-01 ENCOUNTER — Ambulatory Visit: Payer: Medicare Other | Admitting: Nurse Practitioner

## 2022-12-02 MED ORDER — PREGABALIN 150 MG PO CAPS: ORAL_CAPSULE | ORAL | 1 refills | Status: DC

## 2022-12-04 DIAGNOSIS — I129 Hypertensive chronic kidney disease with stage 1 through stage 4 chronic kidney disease, or unspecified chronic kidney disease: Secondary | ICD-10-CM | POA: Insufficient documentation

## 2022-12-04 DIAGNOSIS — Z940 Kidney transplant status: Secondary | ICD-10-CM | POA: Insufficient documentation

## 2022-12-04 NOTE — Assessment & Plan Note (Signed)
Chronic, well controlled. EKG performed, NSR w/ poor R-wave progression, nonspecific T-abnormality and low voltage with rightward P-axis and rotation -possible pulmonary disease.  She will c/w furosemide 40mg  daily and nifeeipine XL 30mg  daily. She is encouraged to follow a low sodium diet. She will f/u in six months.

## 2022-12-04 NOTE — Assessment & Plan Note (Signed)
A full exam was performed. Importance of monthly self breast exams was discussed with the patient. PATIENT IS ADVISED TO GET 30-45 MINUTES REGULAR EXERCISE NO LESS THAN FOUR TO FIVE DAYS PER WEEK - BOTH WEIGHTBEARING EXERCISES AND AEROBIC ARE RECOMMENDED.  PATIENT IS ADVISED TO FOLLOW A HEALTHY DIET WITH AT LEAST SIX FRUITS/VEGGIES PER DAY, DECREASE INTAKE OF RED MEAT, AND TO INCREASE FISH INTAKE TO TWO DAYS PER WEEK.  MEATS/FISH SHOULD NOT BE FRIED, BAKED OR BROILED IS PREFERABLE.  IT IS ALSO IMPORTANT TO CUT BACK ON YOUR SUGAR INTAKE. PLEASE AVOID ANYTHING WITH ADDED SUGAR, CORN SYRUP OR OTHER SWEETENERS. IF YOU MUST USE A SWEETENER, YOU CAN TRY STEVIA. IT IS ALSO IMPORTANT TO AVOID ARTIFICIALLY SWEETENERS AND DIET BEVERAGES. LASTLY, I SUGGEST WEARING SPF 50 SUNSCREEN ON EXPOSED PARTS AND ESPECIALLY WHEN IN THE DIRECT SUNLIGHT FOR AN EXTENDED PERIOD OF TIME.  PLEASE AVOID FAST FOOD RESTAURANTS AND INCREASE YOUR WATER INTAKE.  

## 2022-12-04 NOTE — Assessment & Plan Note (Signed)
BMI 40. She was congratulated on her 3 pound weight loss in the past month. She is encouraged to strive for BMI less than 30 to decrease cardiac risk. Advised to aim for at least 150 minutes of exercise per week.

## 2022-12-04 NOTE — Assessment & Plan Note (Signed)
Chronic, diabetic foot exam was performed. She is currently being managed by Transplant team at [facility].  I DISCUSSED WITH THE PATIENT AT LENGTH REGARDING THE GOALS OF GLYCEMIC CONTROL AND POSSIBLE LONG-TERM COMPLICATIONS.  I  ALSO STRESSED THE IMPORTANCE OF COMPLIANCE WITH HOME GLUCOSE MONITORING, DIETARY RESTRICTIONS INCLUDING AVOIDANCE OF SUGARY DRINKS/PROCESSED FOODS,  ALONG WITH REGULAR EXERCISE.  I  ALSO STRESSED THE IMPORTANCE OF ANNUAL EYE EXAMS, SELF FOOT CARE AND COMPLIANCE WITH OFFICE VISITS.

## 2022-12-09 NOTE — Assessment & Plan Note (Signed)
EKG performed, she is in sinus rhythm. She has been evaluated by Cardiology in the past. I will check electrolytes today. She is encouraged to stay well hydrated. If persistent, will refer her for Zio monitor.

## 2023-01-05 ENCOUNTER — Telehealth: Payer: Self-pay | Admitting: *Deleted

## 2023-01-05 NOTE — Progress Notes (Signed)
  Care Coordination Health Equity Plan  Outreach Note  01/05/2023 Name: Jacqualine Mclagan MRN: [id number] DOB: [birth year]   Care Coordination Outreach Attempts: An unsuccessful telephone outreach was attempted today to offer the patient information about available care coordination services.  Follow Up Plan:  Additional outreach attempts will be made to offer the patient care coordination information and services.   Encounter Outcome:  No Answer  Christie Nottingham  Care Coordination Care Guide  Direct Dial: [phone]

## 2023-01-05 NOTE — Progress Notes (Signed)
  Care Coordination  Health Equity Plan  Note   01/05/2023 Name: Nataliee Rolstad MRN: [id number] DOB: 09-09-1970  Carleene Cooper Wiemken is a 52 y.o. year old female who sees Dorothyann Peng, MD for primary care. I reached out to [facility] by phone today to offer care coordination services.  Ms. Roetman was given information about Care Coordination services today including:   The Care Coordination services include support from the care team which includes your Nurse Coordinator, Clinical Social Worker, or Pharmacist.  The Care Coordination team is here to help remove barriers to the health concerns and goals most important to you. Care Coordination services are voluntary, and the patient may decline or stop services at any time by request to their care team member.   Care Coordination Consent Status: Patient agreed to services and verbal consent obtained.   Follow up plan:  Telephone appointment with care coordination team member scheduled for:  01/26/23  Encounter Outcome:  Pt. Scheduled  [facility] Coordination Care Guide  Direct Dial: [phone]

## 2023-01-13 ENCOUNTER — Ambulatory Visit: Payer: Medicare Other | Admitting: Nurse Practitioner

## 2023-01-19 LAB — HM DIABETES EYE EXAM

## 2023-01-26 ENCOUNTER — Encounter: Payer: Self-pay | Admitting: *Deleted

## 2023-01-26 ENCOUNTER — Ambulatory Visit: Payer: Self-pay | Admitting: *Deleted

## 2023-01-26 NOTE — Patient Outreach (Signed)
  Care Coordination   Initial Visit Note  [facility]   01/26/2023 Name: Cassandra Campos MRN: [id number] DOB: 02-02-1971  Cassandra Campos is a 52 y.o. year old female who sees Cassandra Peng, MD for primary care. I spoke with  Cassandra Campos by phone today.  What matters to the patients health and wellness today?  Keep compliance with current plan of care, remain stable    Goals Addressed             This Visit's Progress    Effective management of chronic medical conditions       Interventions Today    Flowsheet Row Most Recent Value  Chronic Disease   Chronic disease during today's visit Chronic Kidney Disease/End Stage Renal Disease (ESRD), Hypertension (HTN), Diabetes  General Interventions   General Interventions Discussed/Reviewed General Interventions Reviewed, Labs, Annual Eye Exam, Annual Foot Exam, Vaccines, Doctor Visits  Labs Hgb A1c every 3 months, Kidney Function  Vaccines Flu, COVID-19  [will get during next office visit]  Doctor Visits Discussed/Reviewed Doctor Visits Reviewed, Annual Wellness Visits, PCP, Specialist  [ID 10/3, GYN 10/8, AWV done this year, PCP tomorrow but will need to reschedule]  PCP/Specialist Visits Compliance with follow-up visit  Exercise Interventions   Exercise Discussed/Reviewed Physical Activity, Exercise Discussed  Physical Activity Discussed/Reviewed Physical Activity Reviewed, Home Exercise Program (HEP)  [walking daily, 9-10k steps daily]  Education Interventions   Education Provided Provided Education, Provided Web-based Education  Provided Verbal Education On Nutrition, Foot Care, Eye Care, Labs, Blood Sugar Monitoring, Medication, When to see the doctor, Other, Development worker, community, [employer]  [Discussed some incontinence, inquires if insurance will provide supplies, will collaborate with BSW]  Labs Reviewed Hgb A1c  Mental Health Interventions   Mental Health Discussed/Reviewed Mental Health  Discussed, Coping Strategies  Nutrition Interventions   Nutrition Discussed/Reviewed Nutrition Discussed, Adding fruits and vegetables, Portion sizes, Decreasing sugar intake, Increasing proteins  Pharmacy Interventions   Pharmacy Dicussed/Reviewed Pharmacy Topics Discussed, Medication Adherence, Affording Medications  Safety Interventions   Safety Discussed/Reviewed Safety Discussed  Advanced Directive Interventions   Advanced Directives Discussed/Reviewed Advanced Directives Discussed, Provided resource for acquiring and filling out documents              SDOH assessments and interventions completed:  Yes  SDOH Interventions Today    Flowsheet Row Most Recent Value  SDOH Interventions   Food Insecurity Interventions Intervention Not Indicated  Housing Interventions Intervention Not Indicated  Transportation Interventions Intervention Not Indicated  Utilities Interventions Intervention Not Indicated        Care Coordination Interventions:  Yes, provided   Follow up plan: Follow up call scheduled for 10/9    Encounter Outcome:  Patient Visit Completed   Kemper Durie, RN, MSN, [facility] Care Management Care Management Coordinator [phone]

## 2023-01-26 NOTE — Patient Instructions (Signed)
Visit Information  Thank you for taking time to visit with me today. Please don't hesitate to contact me if I can be of assistance to you before our next scheduled telephone appointment.  Our next appointment is by telephone on 10/9  Please call the care guide team at [phone] if you need to cancel or reschedule your appointment.   Please call the Suicide and Crisis Lifeline: 988 call the [place] National Suicide Prevention Lifeline: 406-323-7906 or TTY: 928-394-4591 TTY 209-615-1397) to talk to a trained counselor call 1-800-273-TALK (toll free, 24 hour hotline) call 911 if you are experiencing a Mental Health or Behavioral Health Crisis or need someone to talk to.  Patient verbalizes understanding of instructions and care plan provided today and agrees to view in MyChart. Active MyChart status and patient understanding of how to access instructions and care plan via MyChart confirmed with patient.     The patient has been provided with contact information for the care management team and has been advised to call with any health related questions or concerns.   Kemper Durie [facility] Care Management Care Management Coordinator [phone]

## 2023-01-27 ENCOUNTER — Ambulatory Visit: Payer: Medicare Other | Admitting: Internal Medicine

## 2023-02-22 ENCOUNTER — Ambulatory Visit (INDEPENDENT_AMBULATORY_CARE_PROVIDER_SITE_OTHER): Payer: BC Managed Care – PPO | Admitting: Nurse Practitioner

## 2023-02-22 ENCOUNTER — Other Ambulatory Visit (HOSPITAL_COMMUNITY)
Admission: RE | Admit: 2023-02-22 | Discharge: 2023-02-22 | Disposition: A | Discharge status: Home / Self Care | Payer: BC Managed Care – PPO | Source: Ambulatory Visit | Attending: Nurse Practitioner | Admitting: Nurse Practitioner

## 2023-02-22 ENCOUNTER — Encounter: Payer: Self-pay | Admitting: Nurse Practitioner

## 2023-02-22 VITALS — BP 138/86 | HR 79 | Ht 61.65 in | Wt 210.0 lb

## 2023-02-22 DIAGNOSIS — Z01419 Encounter for gynecological examination (general) (routine) without abnormal findings: Secondary | ICD-10-CM | POA: Diagnosis not present

## 2023-02-22 DIAGNOSIS — N870 Mild cervical dysplasia: Secondary | ICD-10-CM | POA: Insufficient documentation

## 2023-02-22 DIAGNOSIS — Z124 Encounter for screening for malignant neoplasm of cervix: Secondary | ICD-10-CM | POA: Insufficient documentation

## 2023-02-22 DIAGNOSIS — Z780 Asymptomatic menopausal state: Secondary | ICD-10-CM | POA: Diagnosis not present

## 2023-02-22 NOTE — Progress Notes (Signed)
Cassandra Campos [facility] 11/30/1970 [id number]   History:  52 y.o. [id number] presents for breast and pelvic exam.  Postmenopausal - Stopped ERT for transplant surgery last year. Doing OK without it. S/P 2002 TAH for fibroids with subsequent BSO in 2013. 2019 ASCUS neg HR HPV, 11/2021 LGSIL, 02/2022 ASCUS + HR HPV, 04/2022 colpo CIN-1. T2DM managed by PCP. 12/2021 kidney transplant surgery. Maternal aunt and multiple cousins with breast cancer, mother with h/o ovarian cancer, other cancers in family as well, negative genetic testing last year.   Gynecologic History No LMP recorded. Patient has had a hysterectomy.   Contraception/Family planning: status post hysterectomy Sexually active: Yes  Health Maintenance Last Pap: 03/11/2022. Results were: ASCUS + HR HPV Last mammogram: 11/11/2022. Results were: Normal  Last colonoscopy: 03/24/2017. Results were: Normal, 10-year recall Last Dexa: years ago per patient, prior to menopause  Past medical history, past surgical history, family history and social history were all reviewed and documented in the EPIC chart. Married. Works for [employer]. Son age 73 and daughter age 28, both local.   ROS:  A ROS was performed and pertinent positives and negatives are included.  Exam:  Vitals:   02/22/23 1608  BP: 138/86  Pulse: 79  Weight: 210 lb (95.3 kg)  Height: 5' 1.65" (1.566 m)     Body mass index is 38.84 kg/m.  General appearance:  Normal Thyroid:  Symmetrical, normal in size, without palpable masses or nodularity. Respiratory  Auscultation:  Clear without wheezing or rhonchi Cardiovascular  Auscultation:  Regular rate, without rubs, murmurs or gallops  Edema/varicosities:  Not grossly evident Abdominal  Soft,nontender, without masses, guarding or rebound.  Liver/spleen:  No organomegaly noted  Hernia:  None appreciated  Skin  Inspection:  Grossly normal Breasts: Examined lying and  sitting.   Right: Without masses, retractions, nipple discharge or axillary adenopathy.   Left: Without masses, retractions, nipple discharge or axillary adenopathy. Pelvic: External genitalia:  no lesions              Urethra:  normal appearing urethra with no masses, tenderness or lesions              Bartholins and Skenes: normal                 Vagina: normal appearing vagina with normal color and discharge, no lesions              Cervix: absent Bimanual Exam:  Uterus: absent              Adnexa: no mass, fullness, tenderness              Rectovaginal: Deferred              Anus:  normal, no lesions   Patient informed chaperone available to be present for breast and pelvic exam. Patient has requested no chaperone to be present. Patient has been advised what will be completed during breast and pelvic exam.   Assessment/Plan:  52 y.o. [id number] for breast and pelvic exam.   Well female exam with routine gynecological exam - Education provided on SBEs, importance of preventative screenings, current guidelines, high calcium diet, regular exercise, and multivitamin daily. Labs with PCP.   Dysplasia of cervix, low grade (CIN 1) - Plan: Cytology - PAP( Cassandra Campos). 2019 ASCUS neg HR HPV, 11/2021 LGSIL, 02/2022 ASCUS + HR HPV, 04/2022 colpo CIN-1.  Cervical cancer screening - Plan: Cytology - PAP( )  Postmenopausal -  Stopped ERT for transplant surgery last year. Doing OK without it.   Screening for breast cancer - Normal mammogram history.  Continue annual screenings.  Normal breast exam today.   Screening for colon cancer - 2018 colonoscopy. Will repeat at 10-year interval per GI's recommendation.  Return in about 1 year (around 02/22/2024) for Annual.     Cassandra Mackie DNP, 4:26 PM 02/22/2023

## 2023-02-23 ENCOUNTER — Encounter: Payer: Medicare Other | Admitting: *Deleted

## 2023-02-25 LAB — CYTOLOGY - PAP
Comment: NEGATIVE
Diagnosis: UNDETERMINED — AB
High risk HPV: POSITIVE — AB

## 2023-03-01 ENCOUNTER — Other Ambulatory Visit: Payer: Self-pay | Admitting: Nurse Practitioner

## 2023-03-01 DIAGNOSIS — B3731 Acute candidiasis of vulva and vagina: Secondary | ICD-10-CM

## 2023-03-01 MED ORDER — FLUCONAZOLE 150 MG PO TABS
150.0000 mg | ORAL_TABLET | ORAL | 0 refills | Status: DC
Start: 2023-03-01 — End: 2023-05-26

## 2023-03-02 ENCOUNTER — Encounter: Payer: Medicare Other | Admitting: *Deleted

## 2023-03-03 ENCOUNTER — Ambulatory Visit: Payer: Self-pay | Admitting: *Deleted

## 2023-03-03 NOTE — Patient Outreach (Signed)
Care Coordination   Follow Up Visit Note   03/03/2023 Name: Ritta Dogan MRN: [id number] DOB: 02/12/1971  Carleene Cooper Hinchcliffe is a 52 y.o. year old female who sees Dorothyann Peng, MD for primary care. I spoke with  [name] by phone today.  What matters to the patients health and wellness today?  Working to keep all conditions stable, including DM and kidney issues.     Goals Addressed             This Visit's Progress    Effective management of chronic medical conditions   On track    Interventions Today    Flowsheet Row Most Recent Value  Chronic Disease   Chronic disease during today's visit Diabetes, Chronic Kidney Disease/End Stage Renal Disease (ESRD)  General Interventions   General Interventions Discussed/Reviewed General Interventions Reviewed, [facility], Doctor Visits, Labs, Annual Eye Exam  Labs Hgb A1c every 3 months  Doctor Visits Discussed/Reviewed Doctor Visits Reviewed, PCP, Specialist  [Endocrine 10/29, transplant follow up 11/7, urology 11/11, nephrology 11/19, and PCP 1/6]  PCP/Specialist Visits Compliance with follow-up visit  Education Interventions   Education Provided Provided Education  Provided Verbal Education On Blood Sugar Monitoring, When to see the doctor, [facility], Medication, [employer] of resources for incontinence supplies]  Labs Reviewed Hgb A1c              SDOH assessments and interventions completed:  No     Care Coordination Interventions:  Yes, provided   Follow up plan: Follow up call scheduled for 1/7    Encounter Outcome:  Patient Visit Completed   Kemper Durie RN, MSN, CCM [place]  [facility], [facility] RN Care Coordinator Direct Dial: [phone] / Main [phone] Fax [phone] Email: [email or web address] Website: [email or web address]

## 2023-03-03 NOTE — Patient Instructions (Signed)
Visit Information  Thank you for taking time to visit with me today. Please don't hesitate to contact me if I can be of assistance to you before our next scheduled telephone appointment.  Following are the goals we discussed today:  [employer] - [email or web address]   Our next appointment is by telephone on 1/7  Please call the care guide team at [phone] if you need to cancel or reschedule your appointment.   Please call the Suicide and Crisis Lifeline: 988 call the [place] National Suicide Prevention Lifeline: (940)081-9151 or TTY: 262-804-4377 TTY 813-887-3772) to talk to a trained counselor call 1-800-273-TALK (toll free, 24 hour hotline) call 911 if you are experiencing a Mental Health or Behavioral Health Crisis or need someone to talk to.  Patient verbalizes understanding of instructions and care plan provided today and agrees to view in MyChart. Active MyChart status and patient understanding of how to access instructions and care plan via MyChart confirmed with patient.     The patient has been provided with contact information for the care management team and has been advised to call with any health related questions or concerns.   Kemper Durie RN, MSN, CCM [facility], [facility] RN Care Coordinator Direct Dial: [phone] / Main [phone] Fax [phone] Email: [email or web address] Website: [email or web address]

## 2023-03-15 ENCOUNTER — Encounter: Payer: Self-pay | Admitting: Internal Medicine

## 2023-03-15 ENCOUNTER — Ambulatory Visit (INDEPENDENT_AMBULATORY_CARE_PROVIDER_SITE_OTHER): Payer: BC Managed Care – PPO | Admitting: Internal Medicine

## 2023-03-15 VITALS — BP 138/80 | HR 108 | Ht 61.65 in | Wt 217.0 lb

## 2023-03-15 DIAGNOSIS — N186 End stage renal disease: Secondary | ICD-10-CM | POA: Diagnosis not present

## 2023-03-15 DIAGNOSIS — E1122 Type 2 diabetes mellitus with diabetic chronic kidney disease: Secondary | ICD-10-CM | POA: Diagnosis not present

## 2023-03-15 DIAGNOSIS — Z794 Long term (current) use of insulin: Secondary | ICD-10-CM

## 2023-03-15 DIAGNOSIS — E66812 Obesity, class 2: Secondary | ICD-10-CM | POA: Diagnosis not present

## 2023-03-15 DIAGNOSIS — E1169 Type 2 diabetes mellitus with other specified complication: Secondary | ICD-10-CM

## 2023-03-15 DIAGNOSIS — Z6838 Body mass index (BMI) 38.0-38.9, adult: Secondary | ICD-10-CM

## 2023-03-15 DIAGNOSIS — Z992 Dependence on renal dialysis: Secondary | ICD-10-CM | POA: Diagnosis not present

## 2023-03-15 DIAGNOSIS — E785 Hyperlipidemia, unspecified: Secondary | ICD-10-CM

## 2023-03-15 LAB — POCT GLYCOSYLATED HEMOGLOBIN (HGB A1C): Hemoglobin A1C: 6.7 % — AB (ref 4.0–5.6)

## 2023-03-15 NOTE — Patient Instructions (Addendum)
Please stop Ozempic.  Use the following regimen: - Basaglar 24 units in a.m. and 14 units in p.m. - FiAsp 10-15 units 3x a day before meals, + SSI: BG < 150: give no additional insulin;  BG 151-200: add 2 units;  BG 201-250: add 4 units;  BG 251-300: add 6 units;  BG 301-350: add 8 units;  BG 351-400: add 10 units   Please return in 3-4 months.

## 2023-03-15 NOTE — Progress Notes (Signed)
Patient ID: Cassandra Campos, female   DOB: Jun 05, 1970, 52 y.o.   MRN: [id number]   HPI: Cassandra Campos is a 52 y.o.-year-old female, returning for follow-up for DM2, dx in 1997, insulin-dependent right after dx, uncontrolled, with complications (ESRD-on HD since 12/2017, DR, PN).  Last visit 1.5 years ago.  Interim hx: She continues to see the transplant clinic at [facility].  She was on hemodialysis, now had the kidney transplant 12/17/2021. No increased urination, blurry vision, chest pain.   Reviewed HbA1c levels: Lab Results  Component Value Date   HGBA1C 7.1 (H) 11/17/2022   HGBA1C 7.5 (A) 09/09/2021   HGBA1C 6.3 (H) 02/02/2021   HGBA1C 8.5 (A) 07/30/2020   HGBA1C 7.9 (H) 01/08/2020   HGBA1C 8.7 (A) 09/11/2019   HGBA1C 10.9 (H) 04/19/2019   HGBA1C 6.9 (H) 11/09/2018   HGBA1C 7.1 (H) 07/11/2018   HGBA1C 8.5 (H) 03/27/2018  02/24/2021: HbA1c 6.8% 11/18/2020: HbA1c 8.9% 05/26/2020: HbA1c 9.8%  At last visit she was on: - Ozempic 1 mg weekly -restarted 11/2020 >> 2 mg weekly >> stopped 08/2021 2/2 high lipase - U500 insulin (started 2020): - 180-195 >> 200 >> 130-150 >> 140-200 units before breakfast  - 140-150 units before dinner if dinner is early, and 110-120 units if dinner is after dialysis >> 160-170 >> 140-160 >> 110-120 >> 110-140 units before dinner  She is currently on (per [facility]plant team): - Ozempic 2 mg weekly (still on this!) - Basaglar 24 units in a.m. and 14 units in p.m. - Novolog >> FiAsp 10 units 3x a day before meals, + SSI: BG < 150: give no additional insulin;  BG 151-200: add 2 units;  BG 201-250: add 4 units;  BG 251-300: add 6 units;  BG 301-350: add 8 units;  BG 351-400: add 10 units   Pt checks her sugars more than 4 times a day with her CGM:  Prev.: - am: 98-300, usually 110-120 - 2h after b'fast: ? - lunch: 110-120s - 2h after lunch: ? - dinner: 108-130 - 2h after dinner: 158-250 - bedtime:  n/c  Previously:   Lowest sugar was 52 >> 98; she has hypoglycemia awareness in the 60s. Highest sugar was 455 >>...321 >> 300s (pie).  Glucometer: Contour next  Pt's meals are: - Breakfast: yoghurt and blueberries; egg McMuffin - Lunch: fish, tacos, subs, cake - Dinner: salad, steak, shrimp - occasionally after HD at 10 pm, but more recently she has this during dialysis, around 7 PM - Snacks: fruit, peanuts  -+ End-stage renal disease, last BUN/creatinine:  02/24/2023: 25/2.43, GFR 24, glucose 214 Lab Results  Component Value Date   BUN 27 (H) 11/17/2022   BUN 33 (A) 10/22/2021   CREATININE 2.10 (H) 11/17/2022   CREATININE 10.4 (A) 10/22/2021  She is on hemodialysis: M, Tu, Th, F (home) - in the evening, for 3-4 hours: 6:30 PM to 10 PM She is on the kidney transplant list at [facility]. Requirements for kidney transplant: <200 lbs, HbA1c <8% (? 7%), BMI <40.  -+ HL; last set of lipids: Lab Results  Component Value Date   CHOL 173 11/17/2022   HDL 48 11/17/2022   LDLCALC 98 11/17/2022   TRIG 157 (H) 11/17/2022   CHOLHDL 3.6 11/17/2022  06/24/2021: 130/299/42/42 On Crestor 20.  - last eye exam was 01/2023: + DR reportedly. + h/o retinal detachment, + cataracts.  She was getting IO injections, not recently >> now needs Laser Sx. She also sees Dr. Allena Katz (retina specialist).  -  she has numbness and tingling in her feet.  She changed from Lyrica to Neurontin 300 mg, now back on Lyrica [facility].  Last foot exam 09/09/2021.  Pt has FH of DM in M, F.  She also has a history of NAFLD, OSA (but not on CPAP, since she could not tolerate it-now on nasal pillows), gout.   ROS:  + numbness and tingling in his feet  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    nephrologist-  dr Camille Bal-- scheduled next visit 09-08-2017 (lov 07-05-2017)   Constipation    Diabetic retinopathy (HCC)    Dialysis patient (HCC)    Edema, lower extremity    Elevated transaminase level    Family history of breast cancer    Family history of ovarian cancer    Family history of stomach cancer    Fatty liver    FOLLOWED BY DR Marina Goodell   GERD (gastroesophageal reflux disease)    Hypertension    Idiopathic gout of multiple sites    09-05-2017 per pt stable   Insulin dependent type 2 diabetes mellitus (HCC)    followed by dr Evelene Croon   Joint pain    Kidney transplant recipient 12/17/2021   [facility]   Mixed hyperlipidemia    OSA (obstructive sleep apnea)    per study 10-20-2015 mild osa w/ AHI 10.3/hr;  09-05-2017 per pt has not used cpap since 1 yr ago   Palpitations    Peripheral neuropathy    feet   Pure hypercholesterolemia 05/21/2021   S/P arteriovenous (AV) fistula creation 07-04-2015  w/ revision 02-14-2017   left braniocephilic   Shortness of breath    Trigger thumb, right thumb    Umbilical hernia    Vitamin D deficiency    Past Surgical History:  Procedure Laterality Date   A/V FISTULAGRAM N/A 04/01/2021   Procedure: A/V FISTULAGRAM;  Surgeon: Victorino Sparrow, MD;  Location: [facility] INVASIVE CV LAB;  Service: Cardiovascular;  Laterality: N/A;   ABDOMINAL HYSTERECTOMY  07/11/2000   dr gott   TAH/BSO for irregular bleeding and leiomyoma   AV FISTULA PLACEMENT Left 07/04/2015   Procedure: ARTERIOVENOUS (AV) FISTULA CREATION-LEFT;  Surgeon: Pryor Ochoa, MD;  Location: [facility] OR;  Service: Vascular;  Laterality: Left;   BREAST EXCISIONAL BIOPSY Right    benign   CARPAL TUNNEL RELEASE Bilateral 1993 and 1994   CESAREAN SECTION  1993:  09-25-1999   BILATERAL TUBAL LIGATION W/ LAST C/S   DILATION AND CURETTAGE OF UTERUS     EXPLORATORY LAPARTOMY / LYSIS ADHESIONS/  BILATERAL SALPINGOOPHORECTOMY  07-20-2011  dr s. Noland Fordyce  [facility]   FISTULA  SUPERFICIALIZATION Left 08/28/2015   Procedure: BRACHIOCEPHALIC FISTULA SUPERFICIALIZATION;  Surgeon: Pryor Ochoa, MD;  Location: [facility] OR;  Service: Vascular;  Laterality: Left;   KIDNEY TRANSPLANT  12/17/2021   PATCH ANGIOPLASTY Left 02/14/2017   Procedure: PATCH ANGIOPLASTY;  Surgeon: Sherren Kerns, MD;  Location: [facility] OR;  Service: Vascular;  Laterality: Left;   PELVIC LAPAROSCOPY  1994   exploration for ectopic preg.   PERIPHERAL VASCULAR BALLOON ANGIOPLASTY  04/01/2021   Procedure: PERIPHERAL VASCULAR BALLOON ANGIOPLASTY;  Surgeon: Victorino Sparrow, MD;  Location: [facility] INVASIVE  CV LAB;  Service: Cardiovascular;;   RETINAL DETACHMENT SURGERY Left 2015   REVISON OF ARTERIOVENOUS FISTULA Left 02/14/2017   Procedure: REVISON OF ARTERIOVENOUS FISTULA  LEFT ARM;  Surgeon: Sherren Kerns, MD;  Location: [facility] OR;  Service: Vascular;  Laterality: Left;   REVISON OF ARTERIOVENOUS FISTULA Left 07/24/2021   Procedure: REVISON OF ARTERIOVENOUS FISTULA ARM;  Surgeon: Chuck Hint, MD;  Location: [facility] OR;  Service: Vascular;  Laterality: Left;   TRIGGER FINGER RELEASE Left 2015   thumb, also done in 2022   TRIGGER FINGER RELEASE Right 09/09/2017   Procedure: RELEASE TRIGGER FINGER/A-1 PULLEY RIGHT THUMB;  Surgeon: Jodi Geralds, MD;  Location: [facility];  Service: Orthopedics;  Laterality: Right;   Social History   Socioeconomic History   Marital status: Married    Spouse name: Not on file   Number of children: 2   Years of education: Not on file   Highest education level: Not on file  Occupational History   Occupation: medical billing and insurance  Tobacco Use   Smoking status: Never   Smokeless tobacco: Never  Vaping Use   Vaping status: Never Used  Substance and Sexual Activity   Alcohol use: Yes    Comment: socially   Drug use: No   Sexual activity: Yes    Birth control/protection: Surgical    Comment: HYST-1st intercourse 52 yo-Fewer than 5 partners   Other Topics Concern   Not on file  Social History Narrative   Not on file   Social Determinants of Health   Financial Resource Strain: Low Risk  (11/10/2022)   Overall Financial Resource Strain (CARDIA)    Difficulty of Paying Living Expenses: Not hard at all  Food Insecurity: No Food Insecurity (01/26/2023)   Hunger Vital Sign    Worried About Running Out of Food in the Last Year: Never true    Ran Out of Food in the Last Year: Never true  Transportation Needs: No Transportation Needs (01/26/2023)   PRAPARE - Administrator, Civil Service (Medical): No    Lack of Transportation (Non-Medical): No  Physical Activity: Insufficiently Active (11/10/2022)   Exercise Vital Sign    Days of Exercise per Week: 3 days    Minutes of Exercise per Session: 30 min  Stress: No Stress Concern Present (11/10/2022)   [employer] of Occupational Health - Occupational Stress Questionnaire    Feeling of Stress : Not at all  Social Connections: Moderately Integrated (11/10/2022)   Social Connection and Isolation Panel [NHANES]    Frequency of Communication with Friends and Family: More than three times a week    Frequency of Social Gatherings with Friends and Family: Twice a week    Attends Religious Services: Never    Database administrator or Organizations: Yes    Attends Engineer, structural: More than 4 times per year    Marital Status: Married  Catering manager Violence: Not At Risk (11/10/2022)   Humiliation, Afraid, Rape, and Kick questionnaire    Fear of Current or Ex-Partner: No    Emotionally Abused: No    Physically Abused: No    Sexually Abused: No   Current Outpatient Medications on File Prior to Visit  Medication Sig Dispense Refill   albuterol (VENTOLIN HFA) 108 (90 Base) MCG/ACT inhaler Inhale 2 puffs into the lungs every 6 (six) hours as needed for wheezing or shortness of breath. 8 g 2   allopurinol (ZYLOPRIM) 300 MG tablet TAKE  ONE TABLET BY MOUTH EVERY  MORNING (Patient taking differently: Take 300 mg by mouth daily.) 90 tablet 1   aspirin EC 81 MG tablet Take 1 tablet by mouth daily.     Continuous Blood Gluc Receiver (FREESTYLE LIBRE 2 READER) DEVI 1 each by Does not apply route daily. 1 each 0   Continuous Blood Gluc Sensor (FREESTYLE LIBRE 2 SENSOR) MISC USE AS DIRECTED TO test blood sugar, CHANGE EVERY 14 DAYS 6 each 3   fluconazole (DIFLUCAN) 150 MG tablet Take 1 tablet (150 mg total) by mouth every 3 (three) days. 2 tablet 0   furosemide (LASIX) 40 MG tablet daily.     HUMALOG KWIKPEN 100 UNIT/ML KwikPen Inject into the skin.     insulin aspart (NOVOLOG) 100 UNIT/ML injection Inject 10 Units into the skin 3 (three) times daily before meals. With sliding scale     Insulin Glargine (BASAGLAR KWIKPEN [place]) Inject into the skin. 28 units in am and 18 in pm     Insulin Pen Needle 32G X 4 MM MISC Use 2x a day 200 each 3   LANTUS SOLOSTAR 100 UNIT/ML Solostar Pen Inject into the skin.     Microlet Lancets MISC Use as directed to check blood sugars 4 times per day dx: e11.22 300 each 2   NIFEdipine (PROCARDIA-XL/NIFEDICAL-XL) 30 MG 24 hr tablet Take by mouth.     omeprazole (PRILOSEC) 20 MG capsule Take 20 mg by mouth daily.     pregabalin (LYRICA) 150 MG capsule One capsule po daily 90 capsule 1   rosuvastatin (CRESTOR) 20 MG tablet Take 1 tablet (20 mg total) by mouth daily. 90 tablet 3   sevelamer carbonate (RENVELA) 800 MG tablet Take 1,600 mg by mouth 3 (three) times daily.     sodium zirconium cyclosilicate (LOKELMA) 5 g packet Take by mouth.     sulfamethoxazole-trimethoprim (BACTRIM) 400-80 MG tablet Take 1 tablet by mouth 3 (three) times a week. Monday, Wednesday, Friday     valGANciclovir (VALCYTE) 450 MG tablet Take by mouth.     No current facility-administered medications on file prior to visit.   No Known Allergies Family History  Problem Relation Age of Onset   Diabetes Mother    Hypertension Mother    Thyroid disease Mother     Ovarian cancer Mother        dx 59s   Diabetes Father    Hypertension Father    Cancer Father        STOMACH   Stomach cancer Father 87   Breast cancer Maternal Aunt 84   Cancer Paternal Uncle        unknown type, dx >50   Stroke Maternal Grandmother    Stroke Maternal Grandfather    Cancer Paternal Grandfather        unknown type, mets to brain   Cancer Cousin        unknown type, dx 69s, paternal first cousin   Cancer Other        unknown type, father's first cousin   Cancer Other        unknown types, 4 of mother's first cousins   PE: BP 138/80   Pulse (!) 108   Ht 5' 1.65" (1.566 m)   Wt 219 lb 6.4 oz (99.5 kg)   SpO2 98%   BMI 40.59 kg/m  Wt Readings from Last 15 Encounters:  03/15/23 217 lb (98.4 kg)  02/22/23 210 lb (95.3 kg)  11/17/22 212 lb 6.4 oz (  96.3 kg)  11/10/22 215 lb 12.8 oz (97.9 kg)  11/18/21 218 lb (98.9 kg)  11/03/21 220 lb 9.6 oz (100.1 kg)  09/09/21 224 lb 12.8 oz (102 kg)  07/25/21 218 lb 4.1 oz (99 kg)  04/20/21 220 lb 6.4 oz (100 kg)  04/13/21 219 lb 6.4 oz (99.5 kg)  04/01/21 217 lb (98.4 kg)  03/23/21 217 lb 8 oz (98.7 kg)  03/02/21 219 lb 9.6 oz (99.6 kg)  02/26/21 215 lb (97.5 kg)  02/02/21 217 lb 6.4 oz (98.6 kg)   Constitutional: overweight, in NAD Eyes:  EOMI, no exophthalmos ENT: no neck masses, no cervical lymphadenopathy Cardiovascular: tachycardia, RR, No MRG Respiratory: CTA B Musculoskeletal: no deformities Skin:no rashes Neurological: no tremor with outstretched hands Diabetic Foot Exam - Simple   Simple Foot Form Diabetic Foot exam was performed with the following findings: Yes 03/15/2023  4:44 PM  Visual Inspection No deformities, no ulcerations, no other skin breakdown bilaterally: Yes Sensation Testing Intact to touch and monofilament testing bilaterally: Yes Pulse Check Posterior Tibialis and Dorsalis pulse intact bilaterally: Yes Comments    ASSESSMENT: 1. DM2, insulin-dependent, uncontrolled, with  complications - ESRD, on HD, awaiting kidney transplant ([facility]) - DR - PN  06/24/2021: Glucose 135, C-peptide 10.9  2. HL  3.  Obesity class III  PLAN:  1. Patient with longstanding, uncontrolled, type 2 diabetes, very insulin resistant, previously on U-500 concentrated insulin, but now on basal/bolus insulin regimen.  At last visit, she was on weekly GLP-1 receptor agonist, which I advised her to stop due to high lipase.  At that time, HbA1c was 7.5%.  Most recent HbA1c is from 12/02/2022: 6.9%, lower. -At last visit, she forgot his CGM receiver so we could not review the sensor tracings.  Her sugars are higher despite using quite high doses of insulin.  We did discuss about using a higher dose for a larger meal or a meal that contains more carbs/or followed by dessert.  -Since last visit, after her transplant, she has been followed by the transplant team and she is not on U-500 insulin anymore, but on the basal-bolus insulin regimen CGM interpretation: -At today's visit, we reviewed her CGM downloads: It appears that 72% of values are in target range (goal >70%), while the 7% are higher than 180 (goal <25%), and 1% are lower than 70 (goal <4%).  The calculated average blood sugar is 156.  The projected HbA1c for the next 3 months ([facility]) is 7.0%. -Reviewing the CGM trends, sugars are fluctuating mostly within the target range but with higher values after lunch and also occasionally higher values after dinner.  We discussed that for larger meals, she will need higher doses of Fiasp, but otherwise I did not suggest a change in her insulin regimen.  However, she is still on Ozempic at today's visit.  I do not feel that this is safe for her, since she had pancreatitis with very high lipase levels.  We discussed about possible consequences of continuing Ozempic including chronic pancreatitis with a terrible quality of life.  I advised her not to use GLP-1 receptor agonist.  This is unfortunate, since she is  doing well Ozempic from the diabetes point of view... After stopping Ozempic I advised her to pay attention to her blood sugars as she may need higher insulin doses. - I suggested to:  Patient Instructions  Please stop Ozempic.  Use the following regimen: - Basaglar 24 units in a.m. and 14 units in p.m. -  FiAsp 10-15 units 3x a day before meals, + SSI: BG < 150: give no additional insulin;  BG 151-200: add 2 units;  BG 201-250: add 4 units;  BG 251-300: add 6 units;  BG 301-350: add 8 units;  BG 351-400: add 10 units   Please return in 3-4 months.  - we checked her HbA1c: 6.7% (lower) - advised to check sugars at different times of the day - 4x a day, rotating check times - advised for yearly eye exams >> she is UTD - return to clinic in 3-4 months  2. HL -Reviewed latest lipid panel from 06/2021: LDL at goal, but triglycerides high but improved, and HDL low -Continues on Crestor 20 mg daily without side effects  3.  Obesity class III -She needs a BMI less than 40 to qualify for renal transplant. -She was going to the [facility] weight management clinic in the past, but she mentioned that the suggested diet was not a good fit for her due to hemodialysis -Before last visit, she gained 12 pounds -Unfortunately, at last visit we had to stop Ozempic as her lipase was very high, at 499, down from 523, but still very high.  However, at today's visit, she is still on it... Unfortunately, we will need to stop it due to previous pancreatitis range lipase levels -She lost a net 7 pounds since last visit  Carlus Pavlov, MD PhD [facility] Endocrinology

## 2023-03-18 NOTE — Addendum Note (Signed)
Addended by: Pollie Meyer on: 03/18/2023 08:35 AM   Modules accepted: Orders

## 2023-03-21 ENCOUNTER — Encounter: Payer: Self-pay | Admitting: Internal Medicine

## 2023-03-21 MED ORDER — FREESTYLE LIBRE 2 SENSOR MISC: 3 refills | Status: DC

## 2023-03-23 ENCOUNTER — Encounter: Payer: Self-pay | Admitting: Internal Medicine

## 2023-03-23 ENCOUNTER — Telehealth (INDEPENDENT_AMBULATORY_CARE_PROVIDER_SITE_OTHER): Payer: Medicare Other | Admitting: Internal Medicine

## 2023-03-23 DIAGNOSIS — E1141 Type 2 diabetes mellitus with diabetic mononeuropathy: Secondary | ICD-10-CM | POA: Diagnosis not present

## 2023-03-23 DIAGNOSIS — Z794 Long term (current) use of insulin: Secondary | ICD-10-CM

## 2023-03-23 DIAGNOSIS — R238 Other skin changes: Secondary | ICD-10-CM

## 2023-03-23 MED ORDER — VALACYCLOVIR HCL 1 G PO TABS: 1000.0000 mg | ORAL_TABLET | Freq: Two times a day (BID) | ORAL | 0 refills | Status: DC

## 2023-03-23 MED ORDER — PREGABALIN 150 MG PO CAPS: ORAL_CAPSULE | ORAL | 1 refills | Status: DC

## 2023-03-23 NOTE — Progress Notes (Signed)
Virtual Visit via Video   This visit type was conducted due to national recommendations for restrictions regarding the COVID-19 Pandemic (e.g. social distancing) in an effort to limit this patient's exposure and mitigate transmission in our community.  Due to her co-morbid illnesses, this patient is at least at moderate risk for complications without adequate follow up.  This format is felt to be most appropriate for this patient at this time.  All issues noted in this document were discussed and addressed.  A limited physical exam was performed with this format.    This visit type was conducted due to national recommendations for restrictions regarding the COVID-19 Pandemic (e.g. social distancing) in an effort to limit this patient's exposure and mitigate transmission in our community.  Patients identity confirmed using two different identifiers.  This format is felt to be most appropriate for this patient at this time.  All issues noted in this document were discussed and addressed.  No physical exam was performed (except for noted visual exam findings with Video Visits).    Date:  03/29/2023   ID:  Cassandra Campos, DOB 1970-09-01, MRN [id number]  Patient Location:  Work  Provider location:   Biomedical engineer Complaint:  "I have a rash and I think it is shingles"  History of Present Illness:    Cassandra Campos is a 52 y.o. female who presents via video conferencing for a telehealth visit today.    The patient does not have symptoms concerning for COVID-19 infection (fever, chills, cough, or new shortness of breath).   Patient presents today for evaluation of possible shingles outbreak. She reports this initially started 4 days ago. Started off as redness, itching and then the rash erupted.  She was diagnsoed with shingles by Urgent Care in August 2024. She states the rash looks similar, but in a different location.   She reports the outbreak is on her left leg. It  is swollen. Denies any pain or drainage. She states she was prescribed Valtrex for previous outbreak.     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   nephrologist-  dr Camille Bal-- scheduled next visit 09-08-2017 (lov 07-05-2017)   Constipation    Diabetic retinopathy (HCC)    Dialysis patient (HCC)    Edema, lower extremity    Elevated transaminase level    Family history of breast cancer    Family history of ovarian cancer    Family history of stomach cancer    Fatty liver    FOLLOWED BY DR Marina Goodell   GERD (gastroesophageal reflux disease)    Hypertension    Idiopathic gout of multiple sites    09-05-2017 per pt stable   Insulin dependent type 2 diabetes mellitus (HCC)    followed by dr Evelene Croon   Joint pain    Kidney transplant recipient 12/17/2021   [facility]   Mixed hyperlipidemia    OSA (obstructive sleep apnea)    per study 10-20-2015 mild osa w/ AHI 10.3/hr;  09-05-2017 per pt has not used cpap since 1 yr ago   Palpitations    Peripheral neuropathy    feet   Pure hypercholesterolemia 05/21/2021   S/P arteriovenous (AV)  fistula creation 07-04-2015  w/ revision 02-14-2017   left braniocephilic   Shortness of breath    Trigger thumb, right thumb    Umbilical hernia    Vitamin D deficiency    Past Surgical History:  Procedure Laterality Date   A/V FISTULAGRAM N/A 04/01/2021   Procedure: A/V FISTULAGRAM;  Surgeon: Victorino Sparrow, MD;  Location: [facility] INVASIVE CV LAB;  Service: Cardiovascular;  Laterality: N/A;   ABDOMINAL HYSTERECTOMY  07/11/2000   dr gott   TAH/BSO for irregular bleeding and leiomyoma   AV FISTULA PLACEMENT Left 07/04/2015   Procedure: ARTERIOVENOUS (AV) FISTULA CREATION-LEFT;  Surgeon: Pryor Ochoa, MD;   Location: [facility] OR;  Service: Vascular;  Laterality: Left;   BREAST EXCISIONAL BIOPSY Right    benign   CARPAL TUNNEL RELEASE Bilateral 1993 and 1994   CESAREAN SECTION  1993:  09-25-1999   BILATERAL TUBAL LIGATION W/ LAST C/S   DILATION AND CURETTAGE OF UTERUS     EXPLORATORY LAPARTOMY / LYSIS ADHESIONS/  BILATERAL SALPINGOOPHORECTOMY  07-20-2011  dr s. Noland Fordyce  [facility]   FISTULA SUPERFICIALIZATION Left 08/28/2015   Procedure: BRACHIOCEPHALIC FISTULA SUPERFICIALIZATION;  Surgeon: Pryor Ochoa, MD;  Location: [facility] OR;  Service: Vascular;  Laterality: Left;   KIDNEY TRANSPLANT  12/17/2021   PATCH ANGIOPLASTY Left 02/14/2017   Procedure: PATCH ANGIOPLASTY;  Surgeon: Sherren Kerns, MD;  Location: [facility] OR;  Service: Vascular;  Laterality: Left;   PELVIC LAPAROSCOPY  1994   exploration for ectopic preg.   PERIPHERAL VASCULAR BALLOON ANGIOPLASTY  04/01/2021   Procedure: PERIPHERAL VASCULAR BALLOON ANGIOPLASTY;  Surgeon: Victorino Sparrow, MD;  Location: [facility] INVASIVE CV LAB;  Service: Cardiovascular;;   RETINAL DETACHMENT SURGERY Left 2015   REVISON OF ARTERIOVENOUS FISTULA Left 02/14/2017   Procedure: REVISON OF ARTERIOVENOUS FISTULA  LEFT ARM;  Surgeon: Sherren Kerns, MD;  Location: [facility] OR;  Service: Vascular;  Laterality: Left;   REVISON OF ARTERIOVENOUS FISTULA Left 07/24/2021   Procedure: REVISON OF ARTERIOVENOUS FISTULA ARM;  Surgeon: Chuck Hint, MD;  Location: [facility] OR;  Service: Vascular;  Laterality: Left;   TRIGGER FINGER RELEASE Left 2015   thumb, also done in 2022   TRIGGER FINGER RELEASE Right 09/09/2017   Procedure: RELEASE TRIGGER FINGER/A-1 PULLEY RIGHT THUMB;  Surgeon: Jodi Geralds, MD;  Location: [facility];  Service: Orthopedics;  Laterality: Right;     Current Meds  Medication Sig   albuterol (VENTOLIN HFA) 108 (90 Base) MCG/ACT inhaler Inhale 2 puffs into the lungs every 6 (six) hours as needed for wheezing or shortness of breath.   allopurinol  (ZYLOPRIM) 300 MG tablet TAKE ONE TABLET BY MOUTH EVERY MORNING (Patient taking differently: Take 300 mg by mouth daily.)   aspirin EC 81 MG tablet Take 1 tablet by mouth daily.   Continuous Blood Gluc Receiver (FREESTYLE LIBRE 2 READER) DEVI 1 each by Does not apply route daily.   Continuous Glucose Sensor (FREESTYLE LIBRE 2 SENSOR) MISC USE AS DIRECTED TO test blood sugar, CHANGE EVERY 14 DAYS   FIASP FLEXTOUCH 100 UNIT/ML FlexTouch Pen Inject 10 units under the skin before breakfast, lunch and supper PLUS your sliding scale. BG < 150: give no additional insulin; BG 151-200: add 2 units; BG 201-250: add 4 units; BG 251-300: add 6 units; BG 301-350: add 8 units; BG 351-400: add 10 units; BG 400 Strength: 100 unit/mL (3 mL)   fluconazole (DIFLUCAN) 150 MG tablet Take 1 tablet (150 mg total) by mouth every 3 (  three) days.   furosemide (LASIX) 40 MG tablet daily.   Insulin Glargine (BASAGLAR KWIKPEN Crowder) Inject into the skin. 24 units in am and 14 in pm   Insulin Pen Needle 32G X 4 MM MISC Use 2x a day   LANTUS SOLOSTAR 100 UNIT/ML Solostar Pen Inject into the skin.   Microlet Lancets MISC Use as directed to check blood sugars 4 times per day dx: e11.22   NIFEdipine (PROCARDIA-XL/NIFEDICAL-XL) 30 MG 24 hr tablet Take by mouth.   omeprazole (PRILOSEC) 20 MG capsule Take 20 mg by mouth daily.   rosuvastatin (CRESTOR) 20 MG tablet Take 1 tablet (20 mg total) by mouth daily.   sevelamer carbonate (RENVELA) 800 MG tablet Take 1,600 mg by mouth 3 (three) times daily.   sodium zirconium cyclosilicate (LOKELMA) 5 g packet Take by mouth.   sulfamethoxazole-trimethoprim (BACTRIM) 400-80 MG tablet Take 1 tablet by mouth 3 (three) times a week. Monday, Wednesday, Friday   [DISCONTINUED] pregabalin (LYRICA) 150 MG capsule One capsule po daily   [DISCONTINUED] valACYclovir (VALTREX) 1000 MG tablet Take 1,000 mg by mouth 2 (two) times daily.   [DISCONTINUED] valGANciclovir (VALCYTE) 450 MG tablet Take by mouth.      Allergies:   Patient has no known allergies.   Social History   Tobacco Use   Smoking status: Never   Smokeless tobacco: Never  Vaping Use   Vaping status: Never Used  Substance Use Topics   Alcohol use: Yes    Comment: socially   Drug use: No     Family Hx: The patient's family history includes Breast cancer (age of onset: 70) in her maternal aunt; Cancer in her cousin, father, paternal grandfather, paternal uncle, and other family members; Diabetes in her father and mother; Hypertension in her father and mother; Ovarian cancer in her mother; Stomach cancer (age of onset: 8) in her father; Stroke in her maternal grandfather and maternal grandmother; Thyroid disease in her mother.  ROS:   Please see the history of present illness.    Review of Systems  Constitutional: Negative.   HENT: Negative.    Respiratory: Negative.    Cardiovascular: Negative.   Gastrointestinal: Negative.   Genitourinary: Negative.   Skin:  Positive for rash.  Neurological: Negative.   Endo/Heme/Allergies: Negative.   Psychiatric/Behavioral: Negative.      All other systems reviewed and are negative.   Labs/Other Tests and Data Reviewed:    Recent Labs: 11/17/2022: ALT 14; BUN 27; Creatinine, Ser 2.10; Hemoglobin 12.0; Platelets 166; Potassium 3.4; Sodium 145; TSH 2.110   Recent Lipid Panel Lab Results  Component Value Date/Time   CHOL 173 11/17/2022 09:48 AM   TRIG 157 (H) 11/17/2022 09:48 AM   HDL 48 11/17/2022 09:48 AM   CHOLHDL 3.6 11/17/2022 09:48 AM   LDLCALC 98 11/17/2022 09:48 AM    Wt Readings from Last 3 Encounters:  03/15/23 217 lb (98.4 kg)  02/22/23 210 lb (95.3 kg)  11/17/22 212 lb 6.4 oz (96.3 kg)     Exam:    Vital Signs:  There were no vitals taken for this visit.    Physical Exam Vitals and nursing note reviewed.  Constitutional:      Appearance: Normal appearance. She is obese.  HENT:     Head: Normocephalic and atraumatic.  Eyes:     Extraocular  Movements: Extraocular movements intact.  Pulmonary:     Effort: Pulmonary effort is normal.  Musculoskeletal:     Cervical back: Normal range of motion.  Skin:    Comments: Healed rash LLE, no surrounding erythema, no vesicular lesions  Neurological:     Mental Status: She is alert and oriented to person, place, and time.  Psychiatric:        Mood and Affect: Affect normal.     ASSESSMENT & PLAN:    Vesicular rash Assessment & Plan: Resolving. Transplant notes and Urgent Care notes were reviewed in Care Everywhere. I will send rx valacyclovir 1000mg  twice daily. She will f/u with her transplant team.    Diabetic mononeuropathy associated with type 2 diabetes mellitus (HCC) Assessment & Plan: Chronic, sx improved with use of pregabalin.  I will send refill as requested.    Other orders -     valACYclovir HCl; Take 1 tablet (1,000 mg total) by mouth 2 (two) times daily.  Dispense: 14 tablet; Refill: 0 -     Pregabalin; One capsule po daily  Dispense: 90 capsule; Refill: 1     COVID-19 Education: The signs and symptoms of COVID-19 were discussed with the patient and how to seek care for testing (follow up with PCP or arrange E-visit).  The importance of social distancing was discussed today.  Patient Risk:   After full review of this patients clinical status, I feel that they are at least moderate risk at this time.  Time:   Today, I have spent 15 minutes/ seconds with the patient with telehealth technology discussing above diagnoses.     Medication Adjustments/Labs and Tests Ordered: Current medicines are reviewed at length with the patient today.  Concerns regarding medicines are outlined above.   Tests Ordered: No orders of the defined types were placed in this encounter.   Medication Changes: Meds ordered this encounter  Medications   valACYclovir (VALTREX) 1000 MG tablet    Sig: Take 1 tablet (1,000 mg total) by mouth 2 (two) times daily.    Dispense:  14  tablet    Refill:  0   pregabalin (LYRICA) 150 MG capsule    Sig: One capsule po daily    Dispense:  90 capsule    Refill:  1    Disposition:  Follow up prn  Signed, Gwynneth Aliment, MD

## 2023-03-23 NOTE — Patient Instructions (Signed)

## 2023-03-27 ENCOUNTER — Encounter: Payer: Self-pay | Admitting: Internal Medicine

## 2023-03-29 DIAGNOSIS — E1141 Type 2 diabetes mellitus with diabetic mononeuropathy: Secondary | ICD-10-CM | POA: Insufficient documentation

## 2023-03-29 DIAGNOSIS — R238 Other skin changes: Secondary | ICD-10-CM | POA: Insufficient documentation

## 2023-03-29 NOTE — Assessment & Plan Note (Signed)
Chronic, sx improved with use of pregabalin.  I will send refill as requested.

## 2023-03-29 NOTE — Assessment & Plan Note (Signed)
Resolving. Transplant notes and Urgent Care notes were reviewed in Care Everywhere. I will send rx valacyclovir 1000mg  twice daily. She will f/u with her transplant team.

## 2023-04-01 ENCOUNTER — Other Ambulatory Visit: Payer: Self-pay

## 2023-04-21 ENCOUNTER — Ambulatory Visit: Payer: BC Managed Care – PPO | Admitting: Physician Assistant

## 2023-04-21 ENCOUNTER — Encounter: Payer: Self-pay | Admitting: Physician Assistant

## 2023-04-21 VITALS — BP 150/81 | HR 122 | Temp 97.5°F | Ht 61.65 in | Wt 222.3 lb

## 2023-04-21 DIAGNOSIS — T82898A Other specified complication of vascular prosthetic devices, implants and grafts, initial encounter: Secondary | ICD-10-CM

## 2023-04-21 NOTE — Progress Notes (Signed)
HISTORY AND PHYSICAL     CC:  dialysis access Requesting Provider:  Dorothyann Peng, MD  HPI: This is a 52 y.o. female here for evaluation of her hemodialysis access.  She has hx of kidney transplant in August 2023 and it continues to work well.  Her left arm fistula continues to have a thrill.  It is aneurysmal and she wants to inquire about plication.  She is employed by [employer].    Dialysis access history: -left BC AVF 07/04/2015 Dr. Hart Rochester -superficialization left BC AVF 08/28/2015 Dr. Hart Rochester -revision left arm fistula 02/14/2017 Dr. Darrick Penna -fistulogram with balloon angioplasty of AVF 04/01/2021 Dr. Karin Lieu -excision of ulcer over AVF and suture repair of fistula 07/24/2021 Dr. Edilia Bo  The pt is right hand dominant.    Pt is not on dialysis.   Nephrologist:  Marisue Humble   The pt is on a statin for cholesterol management.  The pt is on a daily aspirin.  Other AC:  none The pt is on diuretic for hypertension.  The pt is  on medication for diabetes.   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   nephrologist-  dr Camille Bal-- scheduled next visit 09-08-2017 (lov 07-05-2017)   Constipation    Diabetic retinopathy (HCC)    Dialysis patient (HCC)    Edema, lower extremity    Elevated transaminase level    Family history of breast cancer    Family history of ovarian cancer    Family history of stomach cancer    Fatty liver    FOLLOWED BY DR Marina Goodell   GERD (gastroesophageal reflux disease)    Hypertension    Idiopathic gout of multiple sites    09-05-2017 per pt stable   Insulin dependent type 2 diabetes mellitus (HCC)    followed by dr Evelene Croon   Joint pain    Kidney transplant recipient 12/17/2021   [facility]   Mixed  hyperlipidemia    OSA (obstructive sleep apnea)    per study 10-20-2015 mild osa w/ AHI 10.3/hr;  09-05-2017 per pt has not used cpap since 1 yr ago   Palpitations    Peripheral neuropathy    feet   Pure hypercholesterolemia 05/21/2021   S/P arteriovenous (AV) fistula creation 07-04-2015  w/ revision 02-14-2017   left braniocephilic   Shortness of breath    Trigger thumb, right thumb    Umbilical hernia    Vitamin D deficiency     Past Surgical History:  Procedure Laterality Date   A/V FISTULAGRAM N/A 04/01/2021   Procedure: A/V FISTULAGRAM;  Surgeon: Victorino Sparrow, MD;  Location: [facility] INVASIVE CV LAB;  Service: Cardiovascular;  Laterality: N/A;   ABDOMINAL HYSTERECTOMY  07/11/2000   dr gott   TAH/BSO for irregular bleeding and leiomyoma   AV FISTULA PLACEMENT Left 07/04/2015   Procedure: ARTERIOVENOUS (AV) FISTULA CREATION-LEFT;  Surgeon: Pryor Ochoa, MD;  Location: [facility] OR;  Service: Vascular;  Laterality: Left;   BREAST EXCISIONAL BIOPSY Right    benign   CARPAL TUNNEL RELEASE Bilateral 1993 and 1994   CESAREAN SECTION  1993:  09-25-1999   BILATERAL TUBAL LIGATION W/ LAST C/S   DILATION AND CURETTAGE OF UTERUS  EXPLORATORY LAPARTOMY / LYSIS ADHESIONS/  BILATERAL SALPINGOOPHORECTOMY  07-20-2011  dr s. Noland Fordyce  [facility]   FISTULA SUPERFICIALIZATION Left 08/28/2015   Procedure: BRACHIOCEPHALIC FISTULA SUPERFICIALIZATION;  Surgeon: Pryor Ochoa, MD;  Location: [facility] OR;  Service: Vascular;  Laterality: Left;   KIDNEY TRANSPLANT  12/17/2021   PATCH ANGIOPLASTY Left 02/14/2017   Procedure: PATCH ANGIOPLASTY;  Surgeon: Sherren Kerns, MD;  Location: [facility] OR;  Service: Vascular;  Laterality: Left;   PELVIC LAPAROSCOPY  1994   exploration for ectopic preg.   PERIPHERAL VASCULAR BALLOON ANGIOPLASTY  04/01/2021   Procedure: PERIPHERAL VASCULAR BALLOON ANGIOPLASTY;  Surgeon: Victorino Sparrow, MD;  Location: [facility] INVASIVE CV LAB;  Service: Cardiovascular;;   RETINAL DETACHMENT SURGERY Left  2015   REVISON OF ARTERIOVENOUS FISTULA Left 02/14/2017   Procedure: REVISON OF ARTERIOVENOUS FISTULA  LEFT ARM;  Surgeon: Sherren Kerns, MD;  Location: [facility] OR;  Service: Vascular;  Laterality: Left;   REVISON OF ARTERIOVENOUS FISTULA Left 07/24/2021   Procedure: REVISON OF ARTERIOVENOUS FISTULA ARM;  Surgeon: Chuck Hint, MD;  Location: [facility] OR;  Service: Vascular;  Laterality: Left;   TRIGGER FINGER RELEASE Left 2015   thumb, also done in 2022   TRIGGER FINGER RELEASE Right 09/09/2017   Procedure: RELEASE TRIGGER FINGER/A-1 PULLEY RIGHT THUMB;  Surgeon: Jodi Geralds, MD;  Location: [facility];  Service: Orthopedics;  Laterality: Right;    No Known Allergies  Current Outpatient Medications  Medication Sig Dispense Refill   albuterol (VENTOLIN HFA) 108 (90 Base) MCG/ACT inhaler Inhale 2 puffs into the lungs every 6 (six) hours as needed for wheezing or shortness of breath. 8 g 2   allopurinol (ZYLOPRIM) 300 MG tablet TAKE ONE TABLET BY MOUTH EVERY MORNING (Patient taking differently: Take 300 mg by mouth daily.) 90 tablet 1   aspirin EC 81 MG tablet Take 1 tablet by mouth daily.     Continuous Blood Gluc Receiver (FREESTYLE LIBRE 2 READER) DEVI 1 each by Does not apply route daily. 1 each 0   Continuous Glucose Sensor (FREESTYLE LIBRE 2 SENSOR) MISC USE AS DIRECTED TO test blood sugar, CHANGE EVERY 14 DAYS 6 each 3   FIASP FLEXTOUCH 100 UNIT/ML FlexTouch Pen Inject 10 units under the skin before breakfast, lunch and supper PLUS your sliding scale. BG < 150: give no additional insulin; BG 151-200: add 2 units; BG 201-250: add 4 units; BG 251-300: add 6 units; BG 301-350: add 8 units; BG 351-400: add 10 units; BG 400 Strength: 100 unit/mL (3 mL)     fluconazole (DIFLUCAN) 150 MG tablet Take 1 tablet (150 mg total) by mouth every 3 (three) days. 2 tablet 0   furosemide (LASIX) 40 MG tablet daily.     Insulin Glargine (BASAGLAR KWIKPEN [place]) Inject into the skin. 24 units  in am and 14 in pm     Insulin Pen Needle 32G X 4 MM MISC Use 2x a day 200 each 3   LANTUS SOLOSTAR 100 UNIT/ML Solostar Pen Inject into the skin.     Microlet Lancets MISC Use as directed to check blood sugars 4 times per day dx: e11.22 300 each 2   NIFEdipine (PROCARDIA-XL/NIFEDICAL-XL) 30 MG 24 hr tablet Take by mouth.     omeprazole (PRILOSEC) 20 MG capsule Take 20 mg by mouth daily.     pregabalin (LYRICA) 150 MG capsule One capsule po daily 90 capsule 1   rosuvastatin (CRESTOR) 20 MG tablet Take 1 tablet (20 mg total) by  mouth daily. 90 tablet 3   sevelamer carbonate (RENVELA) 800 MG tablet Take 1,600 mg by mouth 3 (three) times daily.     sodium zirconium cyclosilicate (LOKELMA) 5 g packet Take by mouth.     sulfamethoxazole-trimethoprim (BACTRIM) 400-80 MG tablet Take 1 tablet by mouth 3 (three) times a week. Monday, Wednesday, Friday     valACYclovir (VALTREX) 1000 MG tablet Take 1 tablet (1,000 mg total) by mouth 2 (two) times daily. 14 tablet 0   No current facility-administered medications for this visit.    Family History  Problem Relation Age of Onset   Diabetes Mother    Hypertension Mother    Thyroid disease Mother    Ovarian cancer Mother        dx 70s   Diabetes Father    Hypertension Father    Cancer Father        STOMACH   Stomach cancer Father 18   Breast cancer Maternal Aunt 35   Cancer Paternal Uncle        unknown type, dx >50   Stroke Maternal Grandmother    Stroke Maternal Grandfather    Cancer Paternal Grandfather        unknown type, mets to brain   Cancer Cousin        unknown type, dx 17s, paternal first cousin   Cancer Other        unknown type, father's first cousin   Cancer Other        unknown types, 4 of mother's first cousins    Social History   Socioeconomic History   Marital status: Married    Spouse name: Not on file   Number of children: 2   Years of education: Not on file   Highest education level: Not on file  Occupational  History   Occupation: medical billing and insurance  Tobacco Use   Smoking status: Never   Smokeless tobacco: Never  Vaping Use   Vaping status: Never Used  Substance and Sexual Activity   Alcohol use: Yes    Comment: socially   Drug use: No   Sexual activity: Yes    Birth control/protection: Surgical    Comment: HYST-1st intercourse 52 yo-Fewer than 5 partners  Other Topics Concern   Not on file  Social History Narrative   Not on file   Social Determinants of Health   Financial Resource Strain: Low Risk  (11/10/2022)   Overall Financial Resource Strain (CARDIA)    Difficulty of Paying Living Expenses: Not hard at all  Food Insecurity: No Food Insecurity (01/26/2023)   Hunger Vital Sign    Worried About Running Out of Food in the Last Year: Never true    Ran Out of Food in the Last Year: Never true  Transportation Needs: No Transportation Needs (01/26/2023)   PRAPARE - Administrator, Civil Service (Medical): No    Lack of Transportation (Non-Medical): No  Physical Activity: Insufficiently Active (11/10/2022)   Exercise Vital Sign    Days of Exercise per Week: 3 days    Minutes of Exercise per Session: 30 min  Stress: Not on file (03/24/2023)  Social Connections: Moderately Integrated (11/10/2022)   Social Connection and Isolation Panel [NHANES]    Frequency of Communication with Friends and Family: More than three times a week    Frequency of Social Gatherings with Friends and Family: Twice a week    Attends Religious Services: Never    Database administrator or Organizations:  Yes    Attends Club or Organization Meetings: More than 4 times per year    Marital Status: Married  Catering manager Violence: Not At Risk (11/10/2022)   Humiliation, Afraid, Rape, and Kick questionnaire    Fear of Current or Ex-Partner: No    Emotionally Abused: No    Physically Abused: No    Sexually Abused: No     ROS: [x]  Positive   [ ]  Negative   [ ]  All sytems reviewed and are  negative  Cardiac: []  chest pain/pressure []  SOB/DOE  Vascular: []  pain in legs while walking []  pain in feet when lying flat []  swelling in legs  Pulmonary: []  asthma []  wheezing  Neurologic: [x]  neuropathy in feet  Hematologic: []  bleeding problems  GI []  GERD  GU: []  CKD/renal failure  []  HD---[]  M/W/F []  T/T/S [x]  working transplant  Psychiatric: []  hx of major depression  Integumentary: []  rashes []  ulcers  Constitutional: []  fever []  chills   PHYSICAL EXAMINATION:  Today's Vitals   04/21/23 1440  BP: (!) 150/81  Pulse: (!) 122  Temp: (!) 97.5 F (36.4 C)  TempSrc: Temporal  SpO2: 91%  Weight: 222 lb 4.8 oz (100.8 kg)  Height: 5' 1.65" (1.566 m)   Body mass index is 41.12 kg/m.    General:  WDWN female in NAD Gait: Not observed HENT: WNL Pulmonary: normal non-labored breathing  Cardiac: regular, without carotid bruits Abdomen: soft, NT Skin: without rashes Vascular Exam/Pulses:   Right Left  Radial 2+ (normal) 2+ (normal)   Extremities:  +thrill in left arm fistula; aneurysmal.  No eschar or ulcerations.  Musculoskeletal: no muscle wasting or atrophy  Neurologic: A&O X 3     ASSESSMENT/PLAN: 52 y.o. female with working transplant here for evaluation of her hemodialysis access with hx of left arm BC AVF   -pt seen with Dr. Karin Lieu.   Discussion held about risks of plication including bleeding, infection, thrombosis of fistula.  After long discussion and hearing risks of benefits, she is going to think about plication.  She does not want to get rid of the fistula in case something happens with her transplant and she wants to avoid a [facility] if that ever happens.  -Dr. Karin Lieu asked her to think about it and if she wants to proceed, he will be glad to do her plication with the above risks.  He also discussed with her that he would close her skin with staples as well.  She will contact us if she decides to proceed.      Doreatha Massed,  [facility] [phone]  Clinic MD:   Karin Lieu

## 2023-04-27 ENCOUNTER — Telehealth: Payer: Self-pay

## 2023-04-27 NOTE — Telephone Encounter (Signed)
Patient called stating that she needs to be put on something else since she can not take Ozempic.  She has been having some swelling, SOB and High readings.  Per [name] her Blood sugar average is 224  She is still taking Glass blower/designer as prescribed.   Pt is aware today was your half day and she did not want me to send this to anyone else.  She is also scared since she just got her kidney.

## 2023-04-28 ENCOUNTER — Other Ambulatory Visit: Payer: Self-pay | Admitting: Internal Medicine

## 2023-04-28 MED ORDER — PREGABALIN 150 MG PO CAPS: ORAL_CAPSULE | ORAL | 1 refills | Status: DC

## 2023-04-28 NOTE — Telephone Encounter (Signed)
J, Records, she is using up to 25 units of Fiasp before each meal + sliding scale.  In terms of Basaglar, I believe she is on: 24 units in a.m. and 14 units in p.m.  The sugars appear to increase as the day goes by so my suggestion would be to increase Basaglar in the morning to 30 units.  At this point, with her sugars worsening, we can also think about a possible insulin pump.  Please ask her to review the Omnipod 5 and the t: slim X2 and please let us know if she wants to see our diabetes educator to discuss about them.

## 2023-05-03 NOTE — Telephone Encounter (Signed)
Patient is wanting to talk with you about the starting on the Omnipod. Please giver her a call.

## 2023-05-10 ENCOUNTER — Encounter: Payer: Self-pay | Admitting: Internal Medicine

## 2023-05-17 NOTE — Telephone Encounter (Signed)
Scheduled patient to discuss pump therapy.

## 2023-05-23 ENCOUNTER — Ambulatory Visit: Payer: Medicare Other | Admitting: Internal Medicine

## 2023-05-23 ENCOUNTER — Encounter: Payer: Self-pay | Admitting: Internal Medicine

## 2023-05-24 ENCOUNTER — Ambulatory Visit: Payer: Self-pay | Admitting: *Deleted

## 2023-05-24 NOTE — Patient Outreach (Signed)
  Care Coordination   Follow Up Visit Note   05/24/2023 Name: Cassandra Campos MRN: [id number] DOB: 12-12-1970  Cassandra Campos is a 53 y.o. year old female who sees [name], MD for primary care. I spoke with  Cassandra Campos by phone today.  What matters to the patients health and wellness today?  Report some frustration about gaining her weight back and blood sugars increasing since being taken off Ozempic .  State her lipase was greater then 500, hoping it is now decreased so she can restart.  She will as PCP to recheck. Denies any urgent concerns, encouraged to contact this care manager with questions.      Goals Addressed             This Visit's Progress    Effective management of chronic medical conditions   On track    Interventions Today    Flowsheet Row Most Recent Value  Chronic Disease   Chronic disease during today's visit Diabetes, Chronic Kidney Disease/End Stage Renal Disease (ESRD)  General Interventions   General Interventions Discussed/Reviewed General Interventions Reviewed, Doctor Visits, Labs  Labs --  [Report needing lipase rechecked so she could possibly restart Ozempic ]  Doctor Visits Discussed/Reviewed Doctor Visits Reviewed, PCP, Specialist  [Dietician on 1/8 and PCP on 1/9, infectious disease 1/16]  PCP/Specialist Visits Compliance with follow-up visit  Exercise Interventions   Exercise Discussed/Reviewed Weight Managment  Weight Management Weight maintenance  [Report she has started to gain weight since stopping Ozempic ]  Education Interventions   Education Provided Provided Education  Provided Verbal Education On Medication, Blood Sugar Monitoring, When to see the doctor, Labs  [Now taking Lasix  twice a day due to weight gain and ankle sweling. Would like to go back on Ozempic  if Lipase has decreased.  State blood sugars are also increasin, was greater then 200 today.]  Labs Reviewed Hgb A1c  [last was 6.7, but due for  another reading]              SDOH assessments and interventions completed:  No     Care Coordination Interventions:  Yes, provided   Follow up plan: Follow up call scheduled for 1/21    Encounter Outcome:  Patient Visit Completed   Odella Ku, RN, MSN, CCM [place]  [facility], [facility] RN Care Coordinator Direct Dial : [phone] / Main [phone] Fax [phone] Email: [email or web address] Website: [email or web address]

## 2023-05-25 ENCOUNTER — Ambulatory Visit: Payer: BC Managed Care – PPO | Admitting: Nutrition

## 2023-05-25 ENCOUNTER — Ambulatory Visit: Payer: Medicare Other | Admitting: Family Medicine

## 2023-05-25 MED ORDER — OMNIPOD 5 G7 PODS (GEN 5) MISC: 1.0000 | 0 refills | Status: DC

## 2023-05-25 MED ORDER — OMNIPOD 5 G7 INTRO (GEN 5) KIT: 1.0000 | PACK | Freq: Once | 0 refills | Status: AC

## 2023-05-26 ENCOUNTER — Ambulatory Visit (INDEPENDENT_AMBULATORY_CARE_PROVIDER_SITE_OTHER): Payer: BC Managed Care – PPO | Admitting: Family Medicine

## 2023-05-26 ENCOUNTER — Encounter: Payer: Self-pay | Admitting: Family Medicine

## 2023-05-26 VITALS — BP 140/80 | HR 109 | Temp 98.8°F | Ht 61.0 in | Wt 233.8 lb

## 2023-05-26 DIAGNOSIS — I120 Hypertensive chronic kidney disease with stage 5 chronic kidney disease or end stage renal disease: Secondary | ICD-10-CM

## 2023-05-26 DIAGNOSIS — I150 Renovascular hypertension: Secondary | ICD-10-CM | POA: Diagnosis not present

## 2023-05-26 DIAGNOSIS — Z6841 Body Mass Index (BMI) 40.0 and over, adult: Secondary | ICD-10-CM

## 2023-05-26 DIAGNOSIS — E1169 Type 2 diabetes mellitus with other specified complication: Secondary | ICD-10-CM

## 2023-05-26 DIAGNOSIS — E785 Hyperlipidemia, unspecified: Secondary | ICD-10-CM

## 2023-05-26 DIAGNOSIS — E1129 Type 2 diabetes mellitus with other diabetic kidney complication: Secondary | ICD-10-CM

## 2023-05-26 DIAGNOSIS — E1122 Type 2 diabetes mellitus with diabetic chronic kidney disease: Secondary | ICD-10-CM | POA: Insufficient documentation

## 2023-05-26 DIAGNOSIS — E66813 Obesity, class 3: Secondary | ICD-10-CM

## 2023-05-26 DIAGNOSIS — Z794 Long term (current) use of insulin: Secondary | ICD-10-CM

## 2023-05-26 DIAGNOSIS — E119 Type 2 diabetes mellitus without complications: Secondary | ICD-10-CM | POA: Insufficient documentation

## 2023-05-26 DIAGNOSIS — Z940 Kidney transplant status: Secondary | ICD-10-CM

## 2023-05-26 DIAGNOSIS — Z760 Encounter for issue of repeat prescription: Secondary | ICD-10-CM

## 2023-05-26 MED ORDER — ROSUVASTATIN CALCIUM 20 MG PO TABS: 20.0000 mg | ORAL_TABLET | Freq: Every day | ORAL | 3 refills | Status: DC

## 2023-05-26 MED ORDER — OMEPRAZOLE 20 MG PO CPDR
20.0000 mg | DELAYED_RELEASE_CAPSULE | Freq: Every day | ORAL | 3 refills | Status: DC
Start: 2023-05-26 — End: 2023-06-21

## 2023-05-26 MED ORDER — NIFEDIPINE ER OSMOTIC RELEASE 30 MG PO TB24: 30.0000 mg | ORAL_TABLET | Freq: Every day | ORAL | 3 refills | Status: DC

## 2023-05-26 MED ORDER — ALLOPURINOL 100 MG PO TABS: 100.0000 mg | ORAL_TABLET | Freq: Every day | ORAL | 2 refills | Status: DC

## 2023-05-26 MED ORDER — FUROSEMIDE 40 MG PO TABS: 40.0000 mg | ORAL_TABLET | Freq: Every day | ORAL | 5 refills | Status: DC

## 2023-05-26 NOTE — Progress Notes (Signed)
 LILLETTE Kristeen JINNY Gladis, CMA,acting as a neurosurgeon for Bruna Creighton, NP.,have documented all relevant documentation on the behalf of Bruna Creighton, NP,as directed by  Bruna Creighton, NP while in the presence of Bruna Creighton, NP.  Subjective:  Patient ID: Cassandra Campos , female    DOB: 09-09-1970 , 53 y.o.   MRN: [id number]  Chief Complaint  Patient presents with   Hypertension   Diabetes    HPI  Patient is a 53 year old female who presents for chronic disease management. Patient reports compliance with medications. 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 Her endocrinologist is Dr Montie Fendt, [facility] and she takes Bella infusion Q month for her kidneys  Patient is concerned that her weight and her BS levels are going up but her  endocrinologist had her stop  Ozempic  once weekly injections . Advised patient that per her notes, the endocrinologist expressed concerns about her history of pancreatitis with very elevated lipase so did not think she was a candidate for GLP-1 receptor agonist because of the risk of developing chronic pancreatitis with poor quality of life. Patient voiced understanding.     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   nephrologist-  dr montie dakins-- scheduled next visit 09-08-2017 (lov 07-05-2017)   Constipation    Diabetic retinopathy (HCC)    Dialysis patient (HCC)    Edema, lower extremity    Elevated transaminase level    Family history of breast cancer    Family history of ovarian cancer    Family history of stomach cancer    Fatty liver    FOLLOWED BY DR ABRAN    GERD (gastroesophageal reflux disease)    Hypertension    Idiopathic gout of multiple sites    09-05-2017 per pt stable   Insulin  dependent type 2 diabetes mellitus (HCC)    followed by dr vincente   Joint pain    Kidney transplant recipient 12/17/2021   [facility]   Mixed hyperlipidemia    OSA (obstructive sleep apnea)    per study 10-20-2015 mild osa w/ AHI 10.3/hr;  09-05-2017 per pt has not used cpap since 1 yr ago   Palpitations    Peripheral neuropathy    feet   Pure hypercholesterolemia 05/21/2021   S/P arteriovenous (AV) fistula creation 07-04-2015  w/ revision 02-14-2017   left braniocephilic   Shortness of breath    Trigger thumb, right thumb    Umbilical hernia    Vitamin D deficiency      Family History  Problem Relation Age of Onset   Diabetes Mother    Hypertension Mother    Thyroid  disease Mother    Ovarian cancer Mother        dx 46s   Diabetes Father    Hypertension Father    Cancer Father        STOMACH   Stomach cancer Father 45   Breast cancer Maternal Aunt 39   Cancer Paternal Uncle        unknown type, dx >50   Stroke Maternal Grandmother  Stroke Maternal Grandfather    Cancer Paternal Grandfather        unknown type, mets to brain   Cancer Cousin        unknown type, dx 65s, paternal first cousin   Cancer Other        unknown type, father's first cousin   Cancer Other        unknown types, 4 of mother's first cousins     Current Outpatient Medications:    albuterol  (VENTOLIN  HFA) 108 (90 Base) MCG/ACT inhaler, Inhale 2 puffs into the lungs every 6 (six) hours as needed for wheezing or shortness of breath., Disp: 8 g, Rfl: 2   allopurinol  (ZYLOPRIM ) 100 MG tablet, Take 1 tablet (100 mg total) by mouth daily., Disp: 90 tablet, Rfl: 2   aspirin EC 81 MG tablet, Take 1 tablet by mouth daily., Disp: , Rfl:    Continuous Blood Gluc Receiver (FREESTYLE LIBRE 2 READER) DEVI, 1 each by Does not apply route daily., Disp: 1 each, Rfl: 0    Continuous Glucose Sensor (FREESTYLE LIBRE 2 SENSOR) MISC, USE AS DIRECTED TO test blood sugar, CHANGE EVERY 14 DAYS, Disp: 6 each, Rfl: 3   FIASP  FLEXTOUCH 100 UNIT/ML FlexTouch Pen, Inject 10 units under the skin before breakfast, lunch and supper PLUS your sliding scale. BG < 150: give no additional insulin ; BG 151-200: add 2 units; BG 201-250: add 4 units; BG 251-300: add 6 units; BG 301-350: add 8 units; BG 351-400: add 10 units; BG 400 Strength: 100 unit/mL (3 mL), Disp: , Rfl:    Insulin  Glargine (BASAGLAR  KWIKPEN Rough and Ready), Inject into the skin. 24 units in am and 14 in pm, Disp: , Rfl:    Insulin  Pen Needle 32G X 4 MM MISC, Use 2x a day, Disp: 200 each, Rfl: 3   Microlet Lancets MISC, Use as directed to check blood sugars 4 times per day dx: e11.22, Disp: 300 each, Rfl: 2   pregabalin  (LYRICA ) 150 MG capsule, One capsule po daily, Disp: 90 capsule, Rfl: 1   sulfamethoxazole -trimethoprim  (BACTRIM ) 400-80 MG tablet, Take 1 tablet by mouth 3 (three) times a week. Monday, Wednesday, Friday, Disp: , Rfl:    furosemide  (LASIX ) 40 MG tablet, Take 1 tablet (40 mg total) by mouth daily., Disp: 30 tablet, Rfl: 5   Insulin  Disposable Pump (OMNIPOD 5 G7 PODS, GEN 5,) MISC, 1 each by Does not apply route every 3 (three) days. (Patient not taking: Reported on 05/26/2023), Disp: 30 each, Rfl: 0   LANTUS  SOLOSTAR 100 UNIT/ML Solostar Pen, Inject into the skin. (Patient not taking: Reported on 05/26/2023), Disp: , Rfl:    NIFEdipine  (PROCARDIA -XL/NIFEDICAL-XL) 30 MG 24 hr tablet, Take 1 tablet (30 mg total) by mouth daily., Disp: 90 tablet, Rfl: 3   omeprazole  (PRILOSEC) 20 MG capsule, Take 1 capsule (20 mg total) by mouth daily., Disp: 90 capsule, Rfl: 3   rosuvastatin  (CRESTOR ) 20 MG tablet, Take 1 tablet (20 mg total) by mouth daily., Disp: 90 tablet, Rfl: 3   sevelamer carbonate (RENVELA) 800 MG tablet, Take 1,600 mg by mouth 3 (three) times daily. (Patient not taking: Reported on 05/26/2023), Disp: , Rfl:    sodium  zirconium cyclosilicate (LOKELMA) 5 g packet, Take by mouth. (Patient not taking: Reported on 05/26/2023), Disp: , Rfl:    valACYclovir  (VALTREX ) 1000 MG tablet, Take 1 tablet (1,000 mg total) by mouth 2 (two) times daily. (Patient not taking: Reported on 05/26/2023), Disp: 14 tablet, Rfl: 0   No Known  Allergies   Review of Systems  Constitutional: Negative.   HENT: Negative.    Eyes: Negative.   Respiratory: Negative.    Cardiovascular: Negative.   Endocrine: Negative for polydipsia, polyphagia and polyuria.  Musculoskeletal: Negative.   Skin: Negative.   Neurological: Negative.      Today's Vitals   05/26/23 1627 05/26/23 1650  BP: (!) 140/80   Pulse: (!) 123 (!) 109  Temp: 98.8 F (37.1 C)   TempSrc: Oral   Weight: 233 lb 12.8 oz (106.1 kg)   Height: 5' 1 (1.549 m)   PainSc: 0-No pain    Body mass index is 44.18 kg/m.  Wt Readings from Last 3 Encounters:  05/26/23 233 lb 12.8 oz (106.1 kg)  04/21/23 222 lb 4.8 oz (100.8 kg)  03/15/23 217 lb (98.4 kg)    The 10-year ASCVD risk score (Arnett DK, et al., 2019) is: 11.3%   Values used to calculate the score:     Age: 53 years     Sex: Female     Is Non-Hispanic African American: Yes     Diabetic: Yes     Tobacco smoker: No     Systolic Blood Pressure: 126 mmHg     Is BP treated: Yes     HDL Cholesterol: 44 mg/dL     Total Cholesterol: 210 mg/dL  Objective:  Physical Exam HENT:     Head: Normocephalic.  Cardiovascular:     Rate and Rhythm: Tachycardia present.  Pulmonary:     Effort: Pulmonary effort is normal.     Breath sounds: Normal breath sounds.  Skin:    General: Skin is warm and dry.  Neurological:     Mental Status: She is alert and oriented to person, place, and time.  Psychiatric:        Mood and Affect: Affect is tearful.         Assessment And Plan:  Renovascular hypertension -     Microalbumin / creatinine urine ratio -     BMP8+eGFR -     CBC with Differential/Platelet -     Furosemide ;  Take 1 tablet (40 mg total) by mouth daily.  Dispense: 30 tablet; Refill: 5 -     NIFEdipine  ER Osmotic Release; Take 1 tablet (30 mg total) by mouth daily.  Dispense: 90 tablet; Refill: 3  Kidney transplant recipient Assessment & Plan: 12/17/2021   Dyslipidemia associated with type 2 diabetes mellitus (HCC) -     Lipid panel -     Rosuvastatin  Calcium ; Take 1 tablet (20 mg total) by mouth daily.  Dispense: 90 tablet; Refill: 3  Controlled type 2 diabetes mellitus with other diabetic kidney complication, with long-term current use of insulin  (HCC) -     Hemoglobin A1c  Class 3 severe obesity due to excess calories with serious comorbidity and body mass index (BMI) of 40.0 to 44.9 in adult [facility]) Assessment & Plan: She is encouraged to strive for BMI less than 30 to decrease cardiac risk. Advised to aim for at least 150 minutes of exercise per week.    Medication refill -     Allopurinol ; Take 1 tablet (100 mg total) by mouth daily.  Dispense: 90 tablet; Refill: 2 -     Omeprazole ; Take 1 capsule (20 mg total) by mouth daily.  Dispense: 90 capsule; Refill: 3    Return for keep same next.  Patient was given opportunity to ask questions. Patient verbalized understanding of the plan and was able to  repeat key elements of the plan. All questions were answered to their satisfaction.    I, Bruna Creighton, NP, have reviewed all documentation for this visit. The documentation on 05/30/2023 for the exam, diagnosis, procedures, and orders are all accurate and complete.   IF YOU HAVE BEEN REFERRED TO A SPECIALIST, IT MAY TAKE 1-2 WEEKS TO SCHEDULE/PROCESS THE REFERRAL. IF YOU HAVE NOT HEARD FROM US /SPECIALIST IN TWO WEEKS, PLEASE GIVE US  A CALL AT [phone].

## 2023-05-27 LAB — CBC WITH DIFFERENTIAL/PLATELET
Basophils Absolute: 0 10*3/uL (ref 0.0–0.2)
Basos: 1 %
EOS (ABSOLUTE): 0 10*3/uL (ref 0.0–0.4)
Eos: 1 %
Hematocrit: 35.8 % (ref 34.0–46.6)
Hemoglobin: 11.1 g/dL (ref 11.1–15.9)
Immature Grans (Abs): 0 10*3/uL (ref 0.0–0.1)
Immature Granulocytes: 1 %
Lymphocytes Absolute: 0.3 10*3/uL — ABNORMAL LOW (ref 0.7–3.1)
Lymphs: 8 %
MCH: 26.9 pg (ref 26.6–33.0)
MCHC: 31 g/dL — ABNORMAL LOW (ref 31.5–35.7)
MCV: 87 fL (ref 79–97)
Monocytes Absolute: 0.4 10*3/uL (ref 0.1–0.9)
Monocytes: 11 %
Neutrophils Absolute: 2.9 10*3/uL (ref 1.4–7.0)
Neutrophils: 78 %
Platelets: 210 10*3/uL (ref 150–450)
RBC: 4.12 x10E6/uL (ref 3.77–5.28)
RDW: 15.1 % (ref 11.7–15.4)
WBC: 3.7 10*3/uL (ref 3.4–10.8)

## 2023-05-27 LAB — BMP8+EGFR
BUN/Creatinine Ratio: 11 (ref 9–23)
BUN: 32 mg/dL — ABNORMAL HIGH (ref 6–24)
CO2: 24 mmol/L (ref 20–29)
Calcium: 9.6 mg/dL (ref 8.7–10.2)
Chloride: 100 mmol/L (ref 96–106)
Creatinine, Ser: 2.86 mg/dL — ABNORMAL HIGH (ref 0.57–1.00)
Glucose: 283 mg/dL — ABNORMAL HIGH (ref 70–99)
Potassium: 3.8 mmol/L (ref 3.5–5.2)
Sodium: 141 mmol/L (ref 134–144)
eGFR: 19 mL/min/{1.73_m2} — ABNORMAL LOW (ref >59)

## 2023-05-27 LAB — MICROALBUMIN / CREATININE URINE RATIO
Creatinine, Urine: 97.8 mg/dL
Microalb/Creat Ratio: 32 mg/g{creat} — ABNORMAL HIGH (ref 0–29)
Microalbumin, Urine: 31.5 ug/mL

## 2023-05-27 LAB — LIPID PANEL
Chol/HDL Ratio: 4.8 {ratio} — ABNORMAL HIGH (ref 0.0–4.4)
Cholesterol, Total: 210 mg/dL — ABNORMAL HIGH (ref 100–199)
HDL: 44 mg/dL (ref >39)
LDL Chol Calc (NIH): 117 mg/dL — ABNORMAL HIGH (ref 0–99)
Triglycerides: 284 mg/dL — ABNORMAL HIGH (ref 0–149)
VLDL Cholesterol Cal: 49 mg/dL — ABNORMAL HIGH (ref 5–40)

## 2023-05-27 LAB — HEMOGLOBIN A1C
Est. average glucose Bld gHb Est-mCnc: 183 mg/dL
Hgb A1c MFr Bld: 8 % — ABNORMAL HIGH (ref 4.8–5.6)

## 2023-06-01 ENCOUNTER — Ambulatory Visit: Payer: BC Managed Care – PPO | Admitting: Nutrition

## 2023-06-01 NOTE — Assessment & Plan Note (Signed)
 She is encouraged to strive for BMI less than 30 to decrease cardiac risk. Advised to aim for at least 150 minutes of exercise per week.

## 2023-06-01 NOTE — Assessment & Plan Note (Signed)
12/17/2021 

## 2023-06-07 ENCOUNTER — Ambulatory Visit: Payer: Self-pay | Admitting: *Deleted

## 2023-06-07 NOTE — Patient Outreach (Signed)
  Care Coordination   Follow Up Visit Note   06/07/2023 Name: Cassandra Campos MRN: [id number] DOB: December 15, 1970  Cassandra Campos is a 53 y.o. year old female who sees Dorothyann Peng, MD for primary care. I spoke with  [name] by phone today.  What matters to the patients health and wellness today?  Was seen by PCP team last week, new lipase was not ordered, was advised to follow up with endocrine team.      Goals Addressed             This Visit's Progress    Effective management of chronic medical conditions   On track    Interventions Today    Flowsheet Row Most Recent Value  Chronic Disease   Chronic disease during today's visit Diabetes  General Interventions   General Interventions Discussed/Reviewed General Interventions Reviewed, Doctor Visits, Communication with, Durable Medical Equipment (DME)  Doctor Visits Discussed/Reviewed Doctor Visits Reviewed, Specialist, PCP  [Upcoming reviewed: nutritionist tomorrow, podiatry 2/4, endocrinology 3/4, PCP 3/5]  Durable Medical Equipment (DME) Glucomoter  [Has Omnipod, now needing prescription for CGM prior to use]  PCP/Specialist Visits Compliance with follow-up visit  Communication with PCP/Specialists  [Reached out to endocrinology to request prescription for CGM and possible lipase order]  Exercise Interventions   Exercise Discussed/Reviewed Weight Managment  Weight Management Weight loss  [Continues to work on [employer] loss]  Education Interventions   Education Provided Provided Education  Provided Verbal Education On Medication, When to see the doctor, Blood Sugar Monitoring  [Will start Omnipod, once has CGM. as of now, continue using regular glucose meter. Eager to restart Ozempic]              SDOH assessments and interventions completed:  No     Care Coordination Interventions:  Yes, provided   Follow up plan: Follow up call scheduled for 2/18    Encounter Outcome:  Patient  Visit Completed   Rodney Langton, RN, MSN, CCM [place]  [facility], [facility] RN Care Coordinator Direct Dial: [phone] / Main [phone] Fax [phone] Email: [email or web address]: [email or web address]

## 2023-06-07 NOTE — Patient Instructions (Signed)
Visit Information  Thank you for taking time to visit with me today. Please don't hesitate to contact me if I can be of assistance to you before our next scheduled telephone appointment.  Following are the goals we discussed today:  Discuss repeating labs for lipase in effort to get Ozempic reordered. Follow diabetic diet, decrease portions and carbs/sugars.  Our next appointment is by telephone on 2/18 at 330 pm  Please call the care guide team at [phone] if you need to cancel or reschedule your appointment.   Please call the Suicide and Crisis Lifeline: 988 call the [place] National Suicide Prevention Lifeline: 610-459-9127 or TTY: 931-002-0093 TTY 385-718-4537) to talk to a trained counselor call 1-800-273-TALK (toll free, 24 hour hotline) call 911 if you are experiencing a Mental Health or Behavioral Health Crisis or need someone to talk to.  Patient verbalizes understanding of instructions and care plan provided today and agrees to view in MyChart. Active MyChart status and patient understanding of how to access instructions and care plan via MyChart confirmed with patient.     The patient has been provided with contact information for the care management team and has been advised to call with any health related questions or concerns.   Rodney Langton, RN, MSN, CCM [facility], [facility] RN Care Coordinator Direct Dial: [phone] / Main [phone] Fax [phone] Email: [email or web address]: [email or web address]

## 2023-06-08 ENCOUNTER — Encounter: Payer: BC Managed Care – PPO | Attending: Internal Medicine | Admitting: Nutrition

## 2023-06-08 ENCOUNTER — Other Ambulatory Visit: Payer: Self-pay | Admitting: Internal Medicine

## 2023-06-08 DIAGNOSIS — E1141 Type 2 diabetes mellitus with diabetic mononeuropathy: Secondary | ICD-10-CM | POA: Diagnosis present

## 2023-06-08 MED ORDER — DEXCOM G7 RECEIVER DEVI: 1.0000 | Freq: Once | 0 refills | Status: DC

## 2023-06-08 MED ORDER — DEXCOM G7 SENSOR MISC
3.0000 | 4 refills | Status: DC
Start: 2023-06-08 — End: 2023-06-28

## 2023-06-08 NOTE — Progress Notes (Signed)
Patient is here today to learn about insulin pump therapy.  She says she can not control her blood sugars on injections and thinks this a better option.  She is not wanting a tubing connected to her pump.   We discussed how a body delivers insulin and how this pump will do this without a long acting insulin.  We also discussed what is needed to be on a insulin pump.  She reported good understanding of this and says she thinks this will help her.  We also discussed the need for a balanced diet low in fat.  We discussed the 3 basic food groups, what foods are in each group, how they effect blood sugar and the need to limit each food group, because they all effect blood sugar.  We discussed the need for weight reduction to help with blood sugar control and the need to limit fats for lower calorie reduction and better blood sugar control She reported good understanding and had no final questions.  She will call me when she picks up her insulin pump for training.

## 2023-06-10 NOTE — Patient Instructions (Signed)
Make sure all meals have protein, carbohydrates and small amount of fats at each meal. Stop Lantus the night before coming in for pump training Charge and register your pump before coming in with a user name and password

## 2023-06-13 ENCOUNTER — Encounter: Payer: Self-pay | Admitting: Family Medicine

## 2023-06-16 ENCOUNTER — Telehealth: Payer: Self-pay | Admitting: Dietician

## 2023-06-16 NOTE — Telephone Encounter (Signed)
Returned patient call. Patient needs Omnipod 5 insulin pump training. Patient states that she needs a late afternoon appointment and appointments offered here from 2:30-4:30 were not late enough due to her job. She states that she plans on calling the rep for training.  Oran Rein, RD, LDN, CDCES, DipACLM

## 2023-06-21 ENCOUNTER — Other Ambulatory Visit: Payer: Self-pay

## 2023-06-21 DIAGNOSIS — E1169 Type 2 diabetes mellitus with other specified complication: Secondary | ICD-10-CM

## 2023-06-21 DIAGNOSIS — I150 Renovascular hypertension: Secondary | ICD-10-CM

## 2023-06-21 DIAGNOSIS — Z760 Encounter for issue of repeat prescription: Secondary | ICD-10-CM

## 2023-06-21 MED ORDER — NIFEDIPINE ER OSMOTIC RELEASE 30 MG PO TB24: 30.0000 mg | ORAL_TABLET | Freq: Every day | ORAL | 3 refills | Status: DC

## 2023-06-21 MED ORDER — FUROSEMIDE 40 MG PO TABS: 40.0000 mg | ORAL_TABLET | Freq: Every day | ORAL | 5 refills | Status: DC

## 2023-06-21 MED ORDER — ALLOPURINOL 100 MG PO TABS: 100.0000 mg | ORAL_TABLET | Freq: Every day | ORAL | 2 refills | Status: DC

## 2023-06-21 MED ORDER — OMEPRAZOLE 20 MG PO CPDR: 20.0000 mg | DELAYED_RELEASE_CAPSULE | Freq: Every day | ORAL | 3 refills | Status: DC

## 2023-06-21 MED ORDER — ROSUVASTATIN CALCIUM 20 MG PO TABS: 20.0000 mg | ORAL_TABLET | Freq: Every day | ORAL | 3 refills | Status: DC

## 2023-06-23 NOTE — Telephone Encounter (Signed)
 Returned patient call. She is using the Dexcom G7 and needs training on the Omnipod 5 insulin  pump. Due to her work schedule, she needs an appointment at 4 pm. Will forward to [place] to call patient to schedule.  Cydne Doyne, RD, LDN, CDCES, DipACLM

## 2023-06-24 MED ORDER — FIASP FLEXTOUCH 100 UNIT/ML ~~LOC~~ SOPN: PEN_INJECTOR | SUBCUTANEOUS | 2 refills | Status: DC

## 2023-06-24 MED ORDER — BASAGLAR KWIKPEN 100 UNIT/ML ~~LOC~~ SOPN: PEN_INJECTOR | SUBCUTANEOUS | 2 refills | Status: DC

## 2023-06-24 NOTE — Addendum Note (Signed)
 Addended by: Vernon Goodpasture on: 06/24/2023 08:43 AM   Modules accepted: Orders

## 2023-06-27 ENCOUNTER — Other Ambulatory Visit: Payer: Self-pay | Admitting: Internal Medicine

## 2023-06-28 ENCOUNTER — Other Ambulatory Visit: Payer: Self-pay

## 2023-06-28 ENCOUNTER — Telehealth: Payer: Self-pay | Admitting: Nutrition

## 2023-06-28 DIAGNOSIS — E1122 Type 2 diabetes mellitus with diabetic chronic kidney disease: Secondary | ICD-10-CM

## 2023-06-28 MED ORDER — DEXCOM G7 SENSOR MISC: 3.0000 | 4 refills | Status: DC

## 2023-06-28 NOTE — Telephone Encounter (Signed)
  Dexcom Sensors  refill request complete

## 2023-06-28 NOTE — Telephone Encounter (Signed)
[facility] needing a 4PM appointment.  Scheduled for tomorrow.

## 2023-06-28 NOTE — Telephone Encounter (Signed)
Patient scheduled for tomorrow

## 2023-06-29 ENCOUNTER — Encounter: Payer: BC Managed Care – PPO | Attending: Internal Medicine | Admitting: Nutrition

## 2023-06-29 ENCOUNTER — Other Ambulatory Visit: Payer: Self-pay | Admitting: Internal Medicine

## 2023-06-29 DIAGNOSIS — E1121 Type 2 diabetes mellitus with diabetic nephropathy: Secondary | ICD-10-CM | POA: Insufficient documentation

## 2023-06-29 MED ORDER — FIASP 100 UNIT/ML IJ SOLN: INTRAMUSCULAR | 2 refills | Status: DC

## 2023-06-29 NOTE — Addendum Note (Signed)
Addended by: Pollie Meyer on: 06/29/2023 02:56 PM   Modules accepted: Orders

## 2023-06-29 NOTE — Telephone Encounter (Signed)
Requested Prescriptions   Signed Prescriptions Disp Refills   Insulin Aspart, w/Niacinamide, (FIASP) 100 UNIT/ML SOLN 70 mL 2    Sig: Use up to 75 units via insulin pump    Authorizing Provider: Carlus Pavlov    Ordering User: Pollie Meyer

## 2023-07-01 ENCOUNTER — Telehealth: Payer: Self-pay

## 2023-07-01 ENCOUNTER — Other Ambulatory Visit (HOSPITAL_COMMUNITY): Payer: Self-pay

## 2023-07-01 NOTE — Telephone Encounter (Signed)
Pt needs PA for FiAsp its for her insulin pump.

## 2023-07-03 NOTE — Progress Notes (Signed)
 Patient was trained on how to use the OmniPod 5 insulin pump. She was shown how to use the dexcom G7 sensors and the readings were linked to her Phone and PDM.  The phone app was linked to [facility].  Settings were put into the PDM per Dr. Charlean Sanfilippo orders:  basal rate:  However, the pods were not compatible with the G7 sensors, despite the correct prescription for these.  She will return them to the pharmacy and return on Monday to finish training.

## 2023-07-03 NOTE — Patient Instructions (Signed)
 Return pods to pharmacy and get G5 pods capable to be used with G6 and G7 sensors. Return Monday to finish training

## 2023-07-04 ENCOUNTER — Telehealth: Payer: Self-pay | Admitting: Pharmacy Technician

## 2023-07-04 ENCOUNTER — Other Ambulatory Visit (HOSPITAL_COMMUNITY): Payer: Self-pay

## 2023-07-04 NOTE — Telephone Encounter (Signed)
 Attempted to call pt, left HIPAA compliant VoiceMail requesting a return call. Direct office number provided. Call is in regard to which ins she uses as primary. Or which ins she would like [place] to get a prior authorization with for her fiasp.

## 2023-07-04 NOTE — Telephone Encounter (Signed)
 PA request has been Approved. New Encounter created for follow up. For additional info see Pharmacy Prior Auth telephone encounter from 07/04/23.

## 2023-07-04 NOTE — Telephone Encounter (Signed)
 Pharmacy Patient Advocate Encounter    Received notification from Pt Calls Messages that prior authorization for Cassandra Campos is required/requested.   Insurance verification completed.   The patient is insured through [employer] .   Per test claim: PA required; PA submitted to above mentioned insurance via CoverMyMeds Key/confirmation #/EOC [facility] Status is pending ESI does not manage prior authorizations for this patient. Please resubmit the ePA request to [facility].  Called to find out how to do the PA. Wasn't able to start it in Prompt PA either. [facility] dose these. Not sure why CMM couldn't do it electronically, but she did it over the phone.   Cassandra Campos is approved but [employer] can only do a 30 day supply at a time and it's $100.   Per test claim:  Humalog, generic humalog (Insulin Lispro) or Lyumjev is preferred by the insurance.  If suggested medication is appropriate, Please send in a new RX and discontinue this one.   Lyumjev is $25 a month, per test claim.

## 2023-07-05 ENCOUNTER — Encounter: Payer: Self-pay | Admitting: *Deleted

## 2023-07-05 ENCOUNTER — Ambulatory Visit: Payer: BC Managed Care – PPO | Admitting: Nutrition

## 2023-07-05 NOTE — Telephone Encounter (Signed)
 We can send Lyumjev instead. Ty! C

## 2023-07-05 NOTE — Telephone Encounter (Signed)
 Are you ok with switching insulin as this will be used in her pump.

## 2023-07-06 MED ORDER — LYUMJEV 100 UNIT/ML IJ SOLN: INTRAMUSCULAR | 2 refills | Status: DC

## 2023-07-06 NOTE — Addendum Note (Signed)
 Addended by: Pollie Meyer on: 07/06/2023 08:10 AM   Modules accepted: Orders

## 2023-07-11 ENCOUNTER — Other Ambulatory Visit (HOSPITAL_COMMUNITY): Payer: Self-pay

## 2023-07-11 ENCOUNTER — Telehealth: Payer: Self-pay | Admitting: Nutrition

## 2023-07-11 NOTE — Telephone Encounter (Signed)
 Patient was given the wrong pods for her sensor use.  Omni[employer] called and other pods will be sent to her .  To call me when she gets them in

## 2023-07-11 NOTE — Telephone Encounter (Signed)
 Pharmacy Patient Advocate Encounter  Received notification from [facility] that Prior Authorization for Fiasp vial has been APPROVED from 06/04/23 to 07/03/24  However it's a $100 per month copay.    PA #/Case ID/Reference #: [id number]

## 2023-07-16 ENCOUNTER — Telehealth: Payer: Self-pay | Admitting: Nutrition

## 2023-07-16 NOTE — Telephone Encounter (Signed)
 Called patient to see if she was able to get G7 pods from pharmacy.  Pt. Reported that pharmacist said that the pods give were compatable and can to exchange them.  Rep called to fix this, and he said to have patient call [employer] with this story and they will replace them.  Pt. Notified of this.  Telephone number given to her.

## 2023-07-19 ENCOUNTER — Ambulatory Visit: Payer: BC Managed Care – PPO | Admitting: Internal Medicine

## 2023-07-20 ENCOUNTER — Ambulatory Visit (INDEPENDENT_AMBULATORY_CARE_PROVIDER_SITE_OTHER): Payer: Medicare Other | Admitting: Internal Medicine

## 2023-07-20 ENCOUNTER — Encounter: Payer: Self-pay | Admitting: Internal Medicine

## 2023-07-20 ENCOUNTER — Ambulatory Visit: Attending: Internal Medicine

## 2023-07-20 VITALS — BP 138/70 | HR 114 | Temp 98.3°F | Ht 61.0 in | Wt 230.4 lb

## 2023-07-20 DIAGNOSIS — Z794 Long term (current) use of insulin: Secondary | ICD-10-CM

## 2023-07-20 DIAGNOSIS — Z6841 Body Mass Index (BMI) 40.0 and over, adult: Secondary | ICD-10-CM

## 2023-07-20 DIAGNOSIS — M25473 Effusion, unspecified ankle: Secondary | ICD-10-CM

## 2023-07-20 DIAGNOSIS — N184 Chronic kidney disease, stage 4 (severe): Secondary | ICD-10-CM

## 2023-07-20 DIAGNOSIS — M7989 Other specified soft tissue disorders: Secondary | ICD-10-CM | POA: Diagnosis not present

## 2023-07-20 DIAGNOSIS — R002 Palpitations: Secondary | ICD-10-CM

## 2023-07-20 DIAGNOSIS — E1169 Type 2 diabetes mellitus with other specified complication: Secondary | ICD-10-CM

## 2023-07-20 DIAGNOSIS — R0609 Other forms of dyspnea: Secondary | ICD-10-CM | POA: Diagnosis not present

## 2023-07-20 DIAGNOSIS — Z940 Kidney transplant status: Secondary | ICD-10-CM

## 2023-07-20 DIAGNOSIS — E785 Hyperlipidemia, unspecified: Secondary | ICD-10-CM

## 2023-07-20 DIAGNOSIS — E1122 Type 2 diabetes mellitus with diabetic chronic kidney disease: Secondary | ICD-10-CM

## 2023-07-20 DIAGNOSIS — E66813 Obesity, class 3: Secondary | ICD-10-CM

## 2023-07-20 DIAGNOSIS — I150 Renovascular hypertension: Secondary | ICD-10-CM

## 2023-07-20 DIAGNOSIS — Z79899 Other long term (current) drug therapy: Secondary | ICD-10-CM

## 2023-07-20 MED ORDER — MOUNJARO 5 MG/0.5ML ~~LOC~~ SOAJ: 5.0000 mg | SUBCUTANEOUS | 0 refills | Status: DC

## 2023-07-20 NOTE — Progress Notes (Unsigned)
 EP to read.

## 2023-07-20 NOTE — Progress Notes (Unsigned)
 I,Victoria T Deloria Lair, CMA,acting as a Neurosurgeon for Gwynneth Aliment, MD.,have documented all relevant documentation on the behalf of Gwynneth Aliment, MD,as directed by  Gwynneth Aliment, MD while in the presence of Gwynneth Aliment, MD.  Subjective:  Patient ID: Cassandra Campos , female    DOB: 07/21/1970 , 53 y.o.   MRN: [id number]  No chief complaint on file.   HPI  Patient presents today further evaluation of swelling in her legs & ankles. She states this initially started in January. She states she also noticed that she has gained some weight since she was taken off of Ozempic by her endocrinologist.  She states there was a concern of elevated lipase levels.  She states the LE edema started after she had some weight gain.    Letter sent for DM eye exam.        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   nephrologist-  dr Camille Bal-- scheduled next visit 09-08-2017 (lov 07-05-2017)   Constipation    Diabetic retinopathy (HCC)    Dialysis patient (HCC)    Edema, lower extremity    Elevated transaminase level    Family history of breast cancer    Family history of ovarian cancer    Family history of stomach cancer    Fatty liver    FOLLOWED BY DR Marina Goodell   GERD (gastroesophageal reflux disease)    Hypertension    Idiopathic gout of multiple sites    09-05-2017 per pt stable   Insulin dependent type 2 diabetes mellitus (HCC)    followed by dr Evelene Croon   Joint pain    Kidney transplant recipient 12/17/2021   [facility]   Mixed hyperlipidemia    OSA (obstructive sleep apnea)    per study 10-20-2015 mild osa w/ AHI 10.3/hr;  09-05-2017 per pt has not used cpap since 1 yr ago   Palpitations    Peripheral neuropathy    feet   Pure  hypercholesterolemia 05/21/2021   S/P arteriovenous (AV) fistula creation 07-04-2015  w/ revision 02-14-2017   left braniocephilic   Shortness of breath    Trigger thumb, right thumb    Umbilical hernia    Vitamin D deficiency      Family History  Problem Relation Age of Onset   Diabetes Mother    Hypertension Mother    Thyroid disease Mother    Ovarian cancer Mother        dx 16s   Diabetes Father    Hypertension Father    Cancer Father        STOMACH   Stomach cancer Father 72   Breast cancer Maternal Aunt 24   Cancer Paternal Uncle        unknown type, dx >50   Stroke Maternal Grandmother    Stroke Maternal Grandfather    Cancer Paternal Grandfather        unknown type, mets to brain   Cancer Cousin        unknown type, dx 60s, paternal first cousin   Cancer Other        unknown type, father's first cousin   Cancer Other  unknown types, 4 of mother's first cousins     Current Outpatient Medications:    albuterol (VENTOLIN HFA) 108 (90 Base) MCG/ACT inhaler, Inhale 2 puffs into the lungs every 6 (six) hours as needed for wheezing or shortness of breath., Disp: 8 g, Rfl: 2   allopurinol (ZYLOPRIM) 100 MG tablet, Take 1 tablet (100 mg total) by mouth daily., Disp: 90 tablet, Rfl: 2   aspirin EC 81 MG tablet, Take 1 tablet by mouth daily., Disp: , Rfl:    Continuous Glucose Sensor (DEXCOM G7 SENSOR) MISC, 3 each by Does not apply route every 30 (thirty) days. Apply 1 sensor every 10 days, Disp: 9 each, Rfl: 4   furosemide (LASIX) 40 MG tablet, Take 1 tablet (40 mg total) by mouth daily., Disp: 30 tablet, Rfl: 5   Insulin Glargine (BASAGLAR KWIKPEN) 100 UNIT/ML, 24 units in am and 14 in pm, Disp: 45 mL, Rfl: 2   Insulin Lispro-aabc (LYUMJEV) 100 UNIT/ML SOLN, Use up to 75 units via pump., Disp: 70 mL, Rfl: 2   Insulin Pen Needle 32G X 4 MM MISC, Use 2x a day, Disp: 200 each, Rfl: 3   Microlet Lancets MISC, Use as directed to check blood sugars 4 times per day dx:  e11.22, Disp: 300 each, Rfl: 2   NIFEdipine (PROCARDIA-XL/NIFEDICAL-XL) 30 MG 24 hr tablet, Take 1 tablet (30 mg total) by mouth daily., Disp: 90 tablet, Rfl: 3   omeprazole (PRILOSEC) 20 MG capsule, Take 1 capsule (20 mg total) by mouth daily., Disp: 90 capsule, Rfl: 3   pregabalin (LYRICA) 150 MG capsule, One capsule po daily, Disp: 90 capsule, Rfl: 1   rosuvastatin (CRESTOR) 20 MG tablet, Take 1 tablet (20 mg total) by mouth daily., Disp: 90 tablet, Rfl: 3   sulfamethoxazole-trimethoprim (BACTRIM) 400-80 MG tablet, Take 1 tablet by mouth 3 (three) times a week. Monday, Wednesday, Friday, Disp: , Rfl:    tirzepatide (MOUNJARO) 5 MG/0.5ML Pen, Inject 5 mg into the skin once a week., Disp: 2 mL, Rfl: 0   FIASP FLEXTOUCH 100 UNIT/ML FlexTouch Pen, Inject 10 units under the skin before breakfast, lunch and supper PLUS your sliding scale. BG < 150: give no additional insulin; BG 151-200: add 2 units; BG 201-250: add 4 units; BG 251-300: add 6 units; BG 301-350: add 8 units; BG 351-400: add 10 units; BG 400 Strength: 100 unit/mL (3 mL) (Patient not taking: Reported on 07/20/2023), Disp: 30 mL, Rfl: 2   Insulin Disposable Pump (OMNIPOD 5 G7 PODS, GEN 5,) MISC, 1 each by Does not apply route every 3 (three) days. (Patient not taking: Reported on 07/20/2023), Disp: 30 each, Rfl: 0   sevelamer carbonate (RENVELA) 800 MG tablet, Take 1,600 mg by mouth 3 (three) times daily. (Patient not taking: Reported on 07/20/2023), Disp: , Rfl:    sodium zirconium cyclosilicate (LOKELMA) 5 g packet, Take by mouth. (Patient not taking: Reported on 07/20/2023), Disp: , Rfl:    valACYclovir (VALTREX) 1000 MG tablet, Take 1 tablet (1,000 mg total) by mouth 2 (two) times daily. (Patient not taking: Reported on 07/20/2023), Disp: 14 tablet, Rfl: 0   No Known Allergies   Review of Systems  Constitutional: Negative.   Respiratory: Negative.    Cardiovascular:  Positive for palpitations.       She also c/o intermittent palpitations.  There are no known triggers.  No associated cp, diaphoresis.  She does admit to SOB w/ exertion.   Neurological: Negative.   Psychiatric/Behavioral: Negative.  Today's Vitals   07/20/23 1610 07/20/23 1628  BP: (!) 142/80 138/70  Pulse: (!) 114   Temp: 98.3 F (36.8 C)   SpO2: 98%   Weight: 230 lb 6.4 oz (104.5 kg)   Height: 5\' 1"  (1.549 m)    Body mass index is 43.53 kg/m.  Wt Readings from Last 3 Encounters:  07/20/23 230 lb 6.4 oz (104.5 kg)  05/26/23 233 lb 12.8 oz (106.1 kg)  04/21/23 222 lb 4.8 oz (100.8 kg)     Objective:  Physical Exam Vitals and nursing note reviewed.  Constitutional:      Appearance: Normal appearance. She is obese.  HENT:     Head: Normocephalic and atraumatic.  Eyes:     Extraocular Movements: Extraocular movements intact.  Cardiovascular:     Rate and Rhythm: Regular rhythm. Tachycardia present.     Heart sounds: Normal heart sounds.  Pulmonary:     Effort: Pulmonary effort is normal.     Breath sounds: Normal breath sounds.  Musculoskeletal:     Cervical back: Normal range of motion.     Right lower leg: Edema present.     Left lower leg: Edema present.  Skin:    General: Skin is warm.  Neurological:     General: No focal deficit present.     Mental Status: She is alert.  Psychiatric:        Mood and Affect: Mood normal.        Behavior: Behavior normal.         Assessment And Plan:  Leg swelling Assessment & Plan: Chronic, possibly due to weight gain, CKD and/or diet. I will check labs as below. She is reminded that LE edema is also a side effect of nifedipine, medication she is taking for HTN. She is advised to wear compression hose at work and to decrease sodium intake.   Orders: -     BMP8+eGFR  Palpitations Assessment & Plan: Intermittent. She is tachycardic today with HR 114.  EKG ordered, but unfortunately not performed. She agrees to [facility] monitor for further evaluation.   Orders: -     EKG 12-Lead -     LONG  TERM MONITOR (3-14 DAYS); Future  Renovascular hypertension Assessment & Plan: Chronic, fair control. Goal BP<130/80.  She will continue with furosemide 40mg  daily and nifedipine XL 30mg  daily.  She is also followed by nephrology.   Orders: -     EKG 12-Lead  Dyspnea on exertion Assessment & Plan: Possibly due to deconditioning. Sx could be exacerbated by weight gain. She agrees to 2d echocardiogram to evaluate heart function.  I will check labs as below.  Orders: -     ECHOCARDIOGRAM COMPLETE; Future -     CBC -     Brain natriuretic peptide  Dyslipidemia associated with type 2 diabetes mellitus (HCC) Assessment & Plan: Chronic, we discuss the possibility of trying Mounjaro instead of Ozempic, for greater weight loss benefit. We discuss previously elevated lipase levels, I will recheck today. I agree that the elevated lipase could be due to pancreatic disease; however, at the time of the blood draw, she also had ESRD which could elevate her lipase levels. I will reach out to Endo to discuss further. I do think it is fair to consider low dose Glp-1 agonist therapy. I will make further recommendations once I have spoken with Endo. She also agrees to discuss further with her nephrologist.    Type 2 diabetes mellitus with stage 4 chronic  kidney disease, with long-term current use of insulin (HCC) Assessment & Plan: Chronic, has stage 4 CKD post transplant. Importance of dietary compliance was discussed with the patient. She will c/w Basaglar as per Endo.    Class 3 severe obesity due to excess calories with serious comorbidity and body mass index (BMI) of 40.0 to 44.9 in adult [facility]) Assessment & Plan: She has gained 8 lbs since December. She is encouraged to cut back on her salt intake - including breads/cheese/pizza/deli meats. I will check renal function today.    Kidney transplant recipient  Drug therapy -     Lipase  Other orders -     Mounjaro; Inject 5 mg into the skin once  a week.  Dispense: 2 mL; Refill: 0  She is encouraged to strive for BMI less than 30 to decrease cardiac risk. Advised to aim for at least 150 minutes of exercise per week.    Return if symptoms worsen or fail to improve.  Patient was given opportunity to ask questions. Patient verbalized understanding of the plan and was able to repeat key elements of the plan. All questions were answered to their satisfaction.    I, Gwynneth Aliment, MD, have reviewed all documentation for this visit. The documentation on 07/21/23 for the exam, diagnosis, procedures, and orders are all accurate and complete.   IF YOU HAVE BEEN REFERRED TO A SPECIALIST, IT MAY TAKE 1-2 WEEKS TO SCHEDULE/PROCESS THE REFERRAL. IF YOU HAVE NOT HEARD FROM US/SPECIALIST IN TWO WEEKS, PLEASE GIVE [place] A CALL AT [phone].   THE PATIENT IS ENCOURAGED TO PRACTICE SOCIAL DISTANCING DUE TO THE COVID-19 PANDEMIC.

## 2023-07-21 DIAGNOSIS — Z794 Long term (current) use of insulin: Secondary | ICD-10-CM | POA: Insufficient documentation

## 2023-07-21 DIAGNOSIS — R0609 Other forms of dyspnea: Secondary | ICD-10-CM | POA: Insufficient documentation

## 2023-07-21 DIAGNOSIS — M7989 Other specified soft tissue disorders: Secondary | ICD-10-CM | POA: Insufficient documentation

## 2023-07-21 LAB — BMP8+EGFR
BUN/Creatinine Ratio: 11 (ref 9–23)
BUN: 26 mg/dL — ABNORMAL HIGH (ref 6–24)
CO2: 23 mmol/L (ref 20–29)
Calcium: 9.5 mg/dL (ref 8.7–10.2)
Chloride: 101 mmol/L (ref 96–106)
Creatinine, Ser: 2.47 mg/dL — ABNORMAL HIGH (ref 0.57–1.00)
Glucose: 284 mg/dL — ABNORMAL HIGH (ref 70–99)
Potassium: 4 mmol/L (ref 3.5–5.2)
Sodium: 142 mmol/L (ref 134–144)
eGFR: 23 mL/min/{1.73_m2} — ABNORMAL LOW (ref >59)

## 2023-07-21 LAB — LIPASE: Lipase: 41 U/L (ref 14–72)

## 2023-07-21 LAB — CBC
Hematocrit: 37.3 % (ref 34.0–46.6)
Hemoglobin: 11.8 g/dL (ref 11.1–15.9)
MCH: 26.8 pg (ref 26.6–33.0)
MCHC: 31.6 g/dL (ref 31.5–35.7)
MCV: 85 fL (ref 79–97)
Platelets: 197 10*3/uL (ref 150–450)
RBC: 4.4 x10E6/uL (ref 3.77–5.28)
RDW: 15.1 % (ref 11.7–15.4)
WBC: 4.5 10*3/uL (ref 3.4–10.8)

## 2023-07-21 LAB — BRAIN NATRIURETIC PEPTIDE: BNP: 16 pg/mL (ref 0.0–100.0)

## 2023-07-21 NOTE — Assessment & Plan Note (Signed)
 She has gained 8 lbs since December. She is encouraged to cut back on her salt intake - including breads/cheese/pizza/deli meats. I will check renal function today.

## 2023-07-21 NOTE — Patient Instructions (Signed)
 Shortness of Breath, Adult Shortness of breath means you have trouble breathing. Shortness of breath could be a sign of a medical problem. Follow these instructions at home:  Pollution Do not smoke or use any products that contain nicotine or tobacco. If you need help quitting, ask your doctor. Avoid things that can make it harder to breathe, such as: Smoke of all kinds. This includes smoke from campfires or forest fires. Do not smoke or allow others to smoke in your home. Mold. Dust. Air pollution. Chemical smells. Things that can give you an allergic reaction (allergens) if you have allergies. Keep your living space clean. Use products that help remove mold and dust. General instructions Watch for any changes in your symptoms. Take over-the-counter and prescription medicines only as told by your doctor. This includes oxygen therapy and inhaled medicines. Rest as needed. Return to your normal activities when your doctor says that it is safe. Keep all follow-up visits. Contact a doctor if: Your condition does not get better as soon as expected. You have a hard time doing your normal activities, even after you rest. You have new symptoms. You cannot walk up stairs. You cannot exercise the way you normally do. Get help right away if: Your shortness of breath gets worse. You have trouble breathing when you are resting. You feel light-headed or you faint. You have a cough that is not helped by medicines. You cough up blood. You have pain with breathing. You have pain in your chest, arms, shoulders, or belly (abdomen). You have a fever. These symptoms may be an emergency. Get help right away. Call 911. Do not wait to see if the symptoms will go away. Do not drive yourself to the hospital. Summary Shortness of breath is when you have trouble breathing enough air. It can be a sign of a medical problem. Avoid things that make it hard for you to breathe, such as smoking, pollution,  mold, and dust. Watch for any changes in your symptoms. Contact your doctor if you do not get better or you get worse. This information is not intended to replace advice given to you by your health care provider. Make sure you discuss any questions you have with your health care provider. Document Revised: 12/20/2020 Document Reviewed: 12/20/2020 Elsevier Patient Education  2024 [employer].

## 2023-07-21 NOTE — Assessment & Plan Note (Signed)
 Possibly due to deconditioning. Sx could be exacerbated by weight gain. She agrees to 2d echocardiogram to evaluate heart function.  I will check labs as below.

## 2023-07-21 NOTE — Assessment & Plan Note (Addendum)
 Chronic, possibly due to weight gain, CKD and/or diet. I will check labs as below. She is reminded that LE edema is also a side effect of nifedipine, medication she is taking for HTN. She is advised to wear compression hose at work and to decrease sodium intake.

## 2023-07-21 NOTE — Assessment & Plan Note (Addendum)
 Intermittent. She is tachycardic today with HR 114.  EKG ordered, but unfortunately not performed. She agrees to [facility] monitor for further evaluation.

## 2023-07-21 NOTE — Assessment & Plan Note (Signed)
 Chronic, has stage 4 CKD post transplant. Importance of dietary compliance was discussed with the patient. She will c/w Basaglar as per Endo.

## 2023-07-21 NOTE — Assessment & Plan Note (Signed)
 Chronic, fair control. Goal BP<130/80.  She will continue with furosemide 40mg  daily and nifedipine XL 30mg  daily.  She is also followed by nephrology.

## 2023-07-21 NOTE — Assessment & Plan Note (Signed)
 Chronic, we discuss the possibility of trying Mounjaro instead of Ozempic, for greater weight loss benefit. We discuss previously elevated lipase levels, I will recheck today. I agree that the elevated lipase could be due to pancreatic disease; however, at the time of the blood draw, she also had ESRD which could elevate her lipase levels. I will reach out to Endo to discuss further. I do think it is fair to consider low dose Glp-1 agonist therapy. I will make further recommendations once I have spoken with Endo. She also agrees to discuss further with her nephrologist.

## 2023-07-22 ENCOUNTER — Other Ambulatory Visit: Payer: Self-pay | Admitting: Internal Medicine

## 2023-07-27 ENCOUNTER — Encounter (HOSPITAL_COMMUNITY): Payer: Self-pay

## 2023-07-27 ENCOUNTER — Telehealth: Payer: Self-pay

## 2023-08-08 ENCOUNTER — Telehealth: Payer: Self-pay | Admitting: *Deleted

## 2023-08-08 NOTE — Progress Notes (Signed)
 Complex Care Management Note Care Guide Note  08/08/2023 Name: Cassandra Campos MRN: [id number] DOB: Dec 07, 1970   Complex Care Management Outreach Attempts: An unsuccessful telephone outreach was attempted today to offer the patient information about available complex care management services.  Follow Up Plan:  Additional outreach attempts will be made to offer the patient complex care management information and services.   Encounter Outcome:  No Answer  Burman Nieves, CMA [place]  [facility], [facility] Guide Direct Dial: [phone]  Fax: [phone] Website: [email or web address]

## 2023-08-12 ENCOUNTER — Other Ambulatory Visit: Payer: Self-pay | Admitting: Internal Medicine

## 2023-08-12 MED ORDER — LYUMJEV KWIKPEN 100 UNIT/ML ~~LOC~~ SOPN: PEN_INJECTOR | SUBCUTANEOUS | 3 refills | Status: DC

## 2023-08-15 NOTE — Progress Notes (Signed)
 Complex Care Management Care Guide Note  08/15/2023 Name: Roquel Burgin MRN: [id number] DOB: 1970/09/30  Carleene Cooper Geffert is a 53 y.o. year old female who is a primary care patient of Dorothyann Peng, MD and is actively engaged with the care management team. I reached out to [facility] by phone today to assist with re-scheduling  with the RN Case Manager.  Follow up plan: Telephone appointment with complex care management team member scheduled for:  08/25/2023  Burman Nieves, CMA, Care Guide [facility], [facility] Guide Direct Dial: [phone]  Fax: [phone] Website: [email or web address]

## 2023-08-17 ENCOUNTER — Ambulatory Visit (HOSPITAL_COMMUNITY): Attending: Cardiology

## 2023-08-18 ENCOUNTER — Encounter (HOSPITAL_COMMUNITY): Payer: Self-pay | Admitting: Internal Medicine

## 2023-08-25 ENCOUNTER — Other Ambulatory Visit: Payer: Self-pay

## 2023-08-25 ENCOUNTER — Ambulatory Visit: Payer: Self-pay | Admitting: *Deleted

## 2023-08-25 DIAGNOSIS — E1169 Type 2 diabetes mellitus with other specified complication: Secondary | ICD-10-CM

## 2023-08-25 NOTE — Patient Instructions (Signed)
 Visit Information  Thank you for taking time to visit with me today. Please don't hesitate to contact me if I can be of assistance to you before our next scheduled appointment.  Our next appointment is by telephone on 5/8 at 2pm with A. Little, RN.  Please call the care guide team at [phone] if you need to cancel or reschedule your appointment.   Following is a copy of your care plan:   Goals Addressed             This Visit's Progress    COMPLETED: Effective management of chronic medical conditions   On track    Interventions Today    Flowsheet Row Most Recent Value  Chronic Disease   Chronic disease during today's visit Diabetes  General Interventions   General Interventions Discussed/Reviewed General Interventions Reviewed, Doctor Visits, Communication with, Durable Medical Equipment (DME)  Doctor Visits Discussed/Reviewed Doctor Visits Reviewed, Specialist, PCP  [Upcoming reviewed: nutritionist tomorrow, podiatry 2/4, endocrinology 3/4, PCP 3/5]  Durable Medical Equipment (DME) Glucomoter  [Has Omnipod, now needing prescription for CGM prior to use]  PCP/Specialist Visits Compliance with follow-up visit  Communication with PCP/Specialists  [Reached out to endocrinology to request prescription for CGM and possible lipase order]  Exercise Interventions   Exercise Discussed/Reviewed Weight Managment  Weight Management Weight loss  [Continues to work on [employer] loss]  Education Interventions   Education Provided Provided Education  Provided Verbal Education On Medication, When to see the doctor, Blood Sugar Monitoring  [Will start Omnipod, once has CGM. as of now, continue using regular glucose meter. Eager to restart Ozempic]      Goal transitioned to new title, ongoing     VBCI RN Care Plan       Problems:  Chronic Disease Management support and education needs related to DMII  Goal: Over the next 90 days the Patient will continue to work with Medical illustrator and/or  Social Worker to address care management and care coordination needs related to DMII as evidenced by adherence to CM Team Scheduled appointments     demonstrate Improved adherence to prescribed treatment plan for DMII as evidenced by documented decrease in A1C take all medications exactly as prescribed and will call provider for medication related questions as evidenced by patient reported adherence     Interventions:   Diabetes Interventions: Assessed patient's understanding of A1c goal: <7% Provided education to patient about basic DM disease process Counseled on importance of regular laboratory monitoring as prescribed Discussed plans with patient for ongoing care management follow up and provided patient with direct contact information for care management team Screening for signs and symptoms of depression related to chronic disease state  Assessed social determinant of health barriers Lab Results  Component Value Date   HGBA1C 8.0 (H) 05/26/2023    Patient Self-Care Activities:  schedule appointment with eye doctor check blood sugar at prescribed times: three times daily check feet daily for cuts, sores or redness manage portion size  Plan:  The patient has been provided with contact information for the care management team and has been advised to call with any health related questions or concerns.              Please call the Suicide and Crisis Lifeline: 988 call the [place] National Suicide Prevention Lifeline: 714-797-8848 or TTY: (203) 546-8383 TTY 360 850 8945) to talk to a trained counselor call 1-800-273-TALK (toll free, 24 hour hotline) call 911 if you are experiencing a Mental Health or Behavioral Health  Crisis or need someone to talk to.  Patient verbalizes understanding of instructions and care plan provided today and agrees to view in MyChart. Active MyChart status and patient understanding of how to access instructions and care plan via MyChart confirmed with  patient.     Rodney Langton, RN, MSN, CCM [facility], [facility] RN Care Coordinator Direct Dial: [phone] / Main [phone] Fax [phone] Email: [email or web address]: [email or web address]

## 2023-08-25 NOTE — Patient Outreach (Signed)
 Complex Care Management   Visit Note  08/25/2023  Name:  Cassandra Campos MRN: [id number] DOB: [birth year]  Situation: Referral received for Complex Care Management related to Diabetes with Complications I obtained verbal consent from patient.  Visit completed with patient  on the phone  Patient report doing well, A1C increased slightly, state she has had trouble with obtaining Dexcom or [name] system that is compatible with her Omnipod.  She denies having a pharmacy team member work with her, state she has reached out to MD to inquire about correct system that is compatible and the is covered by her insurance.   Background:   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   nephrologist-  dr Camille Bal-- scheduled next visit 09-08-2017 (lov 07-05-2017)   Constipation    Diabetic retinopathy (HCC)    Dialysis patient (HCC)    Edema, lower extremity    Elevated transaminase level    Family history of breast cancer    Family history of ovarian cancer    Family history of stomach cancer    Fatty liver    FOLLOWED BY DR Marina Goodell   GERD (gastroesophageal reflux disease)    Hypertension    Idiopathic gout of multiple sites    09-05-2017 per pt stable   Insulin dependent type 2 diabetes mellitus (HCC)    followed by dr Evelene Croon   Joint pain    Kidney transplant recipient 12/17/2021   [facility]   Mixed hyperlipidemia    OSA (obstructive sleep apnea)    per study 10-20-2015 mild osa w/ AHI 10.3/hr;  09-05-2017 per pt has not used cpap since 1 yr ago   Palpitations    Peripheral neuropathy    feet   Pure hypercholesterolemia 05/21/2021   S/P arteriovenous (AV) fistula creation 07-04-2015  w/ revision 02-14-2017   left braniocephilic   Shortness of  breath    Trigger thumb, right thumb    Umbilical hernia    Vitamin D deficiency     Assessment: Patient Reported Symptoms:  Cognitive    Neurological No symptoms reported    HEENT No symptoms reported    Cardiovascular No symptoms reported    Respiratory No symptoms reported    Endocrine No symptoms reported    Gastrointestinal No symptoms reported    Genitourinary No symptoms reported    Integumentary No symptoms reported    Musculoskeletal No symptoms reported    Psychosocial No symptoms reported     There were no vitals filed for this visit.  Medications Reviewed Today     Reviewed by Rodney Langton, RN (Registered Nurse) on 08/25/23 at 1713  Med List Status: <None>   Medication Order Taking? Sig Documenting Provider Last Dose Status Informant  albuterol (VENTOLIN HFA) 108 (90 Base) MCG/ACT inhaler [id number] Yes Inhale 2 puffs into the lungs every 6 (six) hours as needed for wheezing or shortness of breath. Arnette Felts, FNP Taking Active Self, Pharmacy Records  allopurinol (ZYLOPRIM) 100 MG tablet [id number] Yes Take 1 tablet (100 mg total) by mouth daily. Ellender Hose, NP Taking Active   aspirin EC 81 MG tablet [id number] Yes Take 1 tablet by mouth daily. [provider] Taking Active   Continuous  Glucose Sensor (DEXCOM G7 SENSOR) MISC [id number] Yes 3 each by Does not apply route every 30 (thirty) days. Apply 1 sensor every 10 days Carlus Pavlov, MD Taking Active   furosemide (LASIX) 40 MG tablet [id number] Yes Take 1 tablet (40 mg total) by mouth daily. Ellender Hose, NP Taking Active   Insulin Disposable Pump (OMNIPOD 5 G7 PODS, GEN 5,) MISC [id number] Yes 1 each by Does not apply route every 3 (three) days. Carlus Pavlov, MD Taking Active   Insulin Glargine [facility]) 100 UNIT/ML [id number] Yes 24 units in am and 14 in pm Carlus Pavlov, MD Taking Active   Insulin Lispro-aabc (LYUMJEV KWIKPEN) 100 UNIT/ML KwikPen [id number] Yes INJECT 10  UNITS UNDER THE SKIN BEFORE BREAKFAST, LUNCH AND SUPPER PLUS YOUR SLIDING SCALE. BG < 150: GIVE NO ADDITIONAL INSULIN BG 151-200: ADD 2 UNITS BG 201-250: ADD 4 UNITS BG 251-300: ADD 6 UNITS BG 301-350: ADD 8 UNITS BG 351-400: ADD 10 UNITS Carlus Pavlov, MD Taking Active   Insulin Lispro-aabc (LYUMJEV) 100 UNIT/ML SOLN [id number] Yes Use up to 75 units via pump. Carlus Pavlov, MD Taking Active   Insulin Pen Needle 32G X 4 MM MISC [id number] Yes Use 2x a day Carlus Pavlov, MD Taking Active Self, Pharmacy Records  Microlet Lancets MISC [id number] Yes Use as directed to check blood sugars 4 times per day dx: e11.22 Dorothyann Peng, MD Taking Active Self, Pharmacy Records  NIFEdipine (PROCARDIA-XL/NIFEDICAL-XL) 30 MG 24 hr tablet [id number] Yes Take 1 tablet (30 mg total) by mouth daily. Ellender Hose, NP Taking Active   omeprazole (PRILOSEC) 20 MG capsule [id number] Yes Take 1 capsule (20 mg total) by mouth daily. Ellender Hose, NP Taking Active   pregabalin (LYRICA) 150 MG capsule [id number]  One capsule po daily Dorothyann Peng, MD  Active   rosuvastatin (CRESTOR) 20 MG tablet [id number] Yes Take 1 tablet (20 mg total) by mouth daily. Ellender Hose, NP Taking Active   sevelamer carbonate (RENVELA) 800 MG tablet [id number] No Take 1,600 mg by mouth 3 (three) times daily.  Patient not taking: Reported on 08/25/2023   [provider] Not Taking Active   sodium zirconium cyclosilicate (LOKELMA) 5 g packet [id number] No Take by mouth.  Patient not taking: Reported on 05/26/2023   [provider] Not Taking Active   sulfamethoxazole-trimethoprim (BACTRIM) 400-80 MG tablet [id number] Yes Take 1 tablet by mouth 3 (three) times a week. Monday, Wednesday, Friday [provider] Taking Active   tirzepatide [facility]) 5 MG/0.5ML Pen [id number] Yes Inject 5 mg into the skin once a week. Dorothyann Peng, MD Taking Active   valACYclovir (VALTREX) 1000 MG tablet [id number] No Take 1 tablet (1,000 mg  total) by mouth 2 (two) times daily.  Patient not taking: Reported on 05/26/2023   Dorothyann Peng, MD Not Taking Active   Med List Note Dorothyann Peng, MD 11/09/18 0911): Dialysis MWF            Recommendation:   PCP Follow-up Specialty provider follow-up endocrinology 6/30 Referral to: pharmacy team for DM management  Follow Up Plan:   Telephone follow up appointment date/time:  5/8 with A. Little  Will reach out to endocrinology team regarding Dexcom 7 or Libre 2+ that will work with [employer] system.  Rodney Langton, RN, MSN, CCM [facility], [facility] RN Care Coordinator Direct Dial: [phone] / Main [phone] Fax [phone] Email: [email or web address]: [email or web address]

## 2023-08-26 ENCOUNTER — Telehealth: Payer: Self-pay

## 2023-08-26 MED ORDER — FREESTYLE LIBRE 2 PLUS SENSOR MISC: 1.0000 | 1 refills | Status: DC

## 2023-08-26 NOTE — Telephone Encounter (Signed)
 Dr. Elvera Lennox Received a message stating that this pt needs Libre 2 plus sent to go with her omnipod.  Requested Prescriptions   Signed Prescriptions Disp Refills   Continuous Glucose Sensor (FREESTYLE LIBRE 2 PLUS SENSOR) MISC 6 each 1    Sig: 1 each by Does not apply route every 14 (fourteen) days.    Authorizing Provider: Carlus Pavlov    Ordering User: Pollie Meyer

## 2023-08-29 ENCOUNTER — Telehealth: Payer: Self-pay | Admitting: Nutrition

## 2023-08-29 NOTE — Telephone Encounter (Signed)
 LVM to call me to see if she every received the right pods to her OmniPod system.  Left number and times to call me back

## 2023-09-01 ENCOUNTER — Other Ambulatory Visit: Payer: Self-pay | Admitting: Nurse Practitioner

## 2023-09-01 ENCOUNTER — Telehealth: Payer: Self-pay | Admitting: Pharmacist

## 2023-09-01 DIAGNOSIS — E1122 Type 2 diabetes mellitus with diabetic chronic kidney disease: Secondary | ICD-10-CM

## 2023-09-01 DIAGNOSIS — B3731 Acute candidiasis of vulva and vagina: Secondary | ICD-10-CM

## 2023-09-01 NOTE — Progress Notes (Signed)
   09/01/2023  Patient ID: Cassandra Campos, female   DOB: 31-Dec-1970, 53 y.o.   MRN: [id number]  Called Patient to help with CGM synching with her Omnipod.  From my initial research Dexcom 6 and 7 are compatible.  Unfortunately she did not answer the phone. HIPAA compliant message was left on her voicemail with a request for call back.  Plan:  Will call patient back in 3-5 business days.   Geronimo Krabbe, PharmD, BCACP Clinical Pharmacist [phone]

## 2023-09-05 ENCOUNTER — Ambulatory Visit (INDEPENDENT_AMBULATORY_CARE_PROVIDER_SITE_OTHER): Admitting: Internal Medicine

## 2023-09-05 ENCOUNTER — Encounter: Payer: Self-pay | Admitting: Internal Medicine

## 2023-09-05 VITALS — BP 130/70 | HR 115 | Ht 61.0 in | Wt 227.6 lb

## 2023-09-05 DIAGNOSIS — N186 End stage renal disease: Secondary | ICD-10-CM

## 2023-09-05 DIAGNOSIS — E1122 Type 2 diabetes mellitus with diabetic chronic kidney disease: Secondary | ICD-10-CM | POA: Diagnosis not present

## 2023-09-05 DIAGNOSIS — Z6838 Body mass index (BMI) 38.0-38.9, adult: Secondary | ICD-10-CM

## 2023-09-05 DIAGNOSIS — Z992 Dependence on renal dialysis: Secondary | ICD-10-CM

## 2023-09-05 DIAGNOSIS — Z794 Long term (current) use of insulin: Secondary | ICD-10-CM | POA: Diagnosis not present

## 2023-09-05 DIAGNOSIS — E66812 Obesity, class 2: Secondary | ICD-10-CM

## 2023-09-05 DIAGNOSIS — E785 Hyperlipidemia, unspecified: Secondary | ICD-10-CM

## 2023-09-05 DIAGNOSIS — E1169 Type 2 diabetes mellitus with other specified complication: Secondary | ICD-10-CM

## 2023-09-05 MED ORDER — TIRZEPATIDE 7.5 MG/0.5ML ~~LOC~~ SOAJ: 7.5000 mg | SUBCUTANEOUS | 3 refills | Status: DC

## 2023-09-05 NOTE — Telephone Encounter (Signed)
 Med refill request: fluconazole  (Diflucan ) 150 mg Last AEX: 11/18/21 TW Next AEX: not schedule, sent message to front desk to schedule patient for annual visit Last MMG (if hormonal med) 11/15/22 Refill authorized: Last Rx sent #1 tablet with zero refills on 04/16/2019. Please approve or deny as appropriate.

## 2023-09-05 NOTE — Progress Notes (Signed)
 Patient ID: Cassandra Campos, female   DOB: March 21, 1971, 53 y.o.   MRN: [id number]   HPI: Cassandra Campos is a 53 y.o.-year-old female, returning for follow-up for DM2, dx in 1997, insulin -dependent right after dx, uncontrolled, with complications (ESRD-on HD since 12/2017, DR, PN).  Last visit 6 months ago.  Interim hx: She had a kidney transplant 12/17/2021. Now on Prednisone  5 mg daily.  No increased urination, blurry vision, chest pain.  Since last visit, she was able to obtain an insulin  pump, but she did not have this attached yet. She started on Mounjaro  per PCPs recommendation 07/2023.  She tolerated well.  Reviewed HbA1c levels: 08/25/2023: HbA1c 9.2% Lab Results  Component Value Date   HGBA1C 8.0 (H) 05/26/2023   HGBA1C 6.7 (A) 03/15/2023   HGBA1C 7.1 (H) 11/17/2022   HGBA1C 7.5 (A) 09/09/2021   HGBA1C 6.3 (H) 02/02/2021   HGBA1C 8.5 (A) 07/30/2020   HGBA1C 7.9 (H) 01/08/2020   HGBA1C 8.7 (A) 09/11/2019   HGBA1C 10.9 (H) 04/19/2019   HGBA1C 6.9 (H) 11/09/2018  02/24/2021: HbA1c 6.8% 11/18/2020: HbA1c 8.9% 05/26/2020: HbA1c 9.8%  She was placed on: - Ozempic  1 mg weekly -restarted 11/2020 >> 2 mg weekly >> stopped 08/2021 2/2 high lipase - U500 insulin  (started 2020): - 180-195 >> 200 >> 130-150 >> 140-200 units before breakfast  - 140-150 units before dinner if dinner is early, and 110-120 units if dinner is after dialysis >> 160-170 >> 140-160 >> 110-120 >> 110-140 units before dinner  Then on: - Mounjaro  5 mg weekly by PCP - started 07/2023.  Per my discussion with PCP, it appears that the previous high lipase could have been related to her end-stage renal disease. - Basaglar  24 units in a.m. and 14 units in p.m. >> 40-50 units 2x a day - Novolog  >> FiAsp  10 units 3x a day before meals, + SSI >> Lyumjev  35-45 units before meals 2-3x a day BG < 150: give no additional insulin ;  BG 151-200: add 2 units;  BG 201-250: add 4 units;  BG 251-300: add 6  units;  BG 301-350: add 8 units;  BG 351-400: add 10 units   CGM: - Libre 2 plus/ now Dexcom G7  Pt checks her sugars more than 4 times a day with her CGM:  Previously:  Prev.: - am: 98-300, usually 110-120 - 2h after b'fast: ? - lunch: 110-120s - 2h after lunch: ? - dinner: 108-130 - 2h after dinner: 158-250 - bedtime: n/c  Lowest sugar was 52 >> 98; she has hypoglycemia awareness in the 60s. Highest sugar was 455 >>...321 >> 300s (pie).  Glucometer: Contour next  Pt's meals are: - Breakfast: yoghurt and blueberries; egg McMuffin - Lunch: fish, tacos, subs, cake - Dinner: salad, steak, shrimp - occasionally after HD at 10 pm, but more recently she has this during dialysis, around 7 PM - Snacks: fruit, peanuts  -+ End-stage renal disease, last BUN/creatinine:  Lab Results  Component Value Date   BUN 26 (H) 07/20/2023   BUN 32 (H) 05/26/2023   CREATININE 2.47 (H) 07/20/2023   CREATININE 2.86 (H) 05/26/2023  She was on hemodialysis previously.  -+ HL; last set of lipids: Lab Results  Component Value Date   CHOL 210 (H) 05/26/2023   HDL 44 05/26/2023   LDLCALC 117 (H) 05/26/2023   TRIG 284 (H) 05/26/2023   CHOLHDL 4.8 (H) 05/26/2023  06/24/2021: 130/299/42/42 On Crestor  20.  - last eye exam was 01/2023: + DR reportedly. +  h/o retinal detachment, + cataracts.  She was getting IO injections, not recently >> now needs Laser Sx. She also sees Dr. Lydia Sams (retina specialist).  - she has numbness and tingling in her feet.  She changed from Lyrica  to Neurontin 300 mg, now back on Lyrica  [facility].  Last foot exam 03/15/2023.  Pt has FH of DM in M, F.  She also has a history of NAFLD, OSA (but not on CPAP, since she could not tolerate it-now on nasal pillows), gout.   ROS:  + numbness and tingling in his feet  Past Medical History:  Diagnosis Date   Anemia associated with chronic renal failure    ASCUS of cervix with negative high risk HPV 06/2017   Back pain     Chest pain    CHF (congestive heart failure) (HCC)    CKD (chronic kidney disease), stage IV (HCC) no dialysis yet   nephrologist-  dr Verlena Glenn-- scheduled next visit 09-08-2017 (lov 07-05-2017)   Constipation    Diabetic retinopathy (HCC)    Dialysis patient (HCC)    Edema, lower extremity    Elevated transaminase level    Family history of breast cancer    Family history of ovarian cancer    Family history of stomach cancer    Fatty liver    FOLLOWED BY DR Elvin Hammer   GERD (gastroesophageal reflux disease)    Hypertension    Idiopathic gout of multiple sites    09-05-2017 per pt stable   Insulin  dependent type 2 diabetes mellitus (HCC)    followed by dr Deborra Falter   Joint pain    Kidney transplant recipient 12/17/2021   [facility]   Mixed hyperlipidemia    OSA (obstructive sleep apnea)    per study 10-20-2015 mild osa w/ AHI 10.3/hr;  09-05-2017 per pt has not used cpap since 1 yr ago   Palpitations    Peripheral neuropathy    feet   Pure hypercholesterolemia 05/21/2021   S/P arteriovenous (AV) fistula creation 07-04-2015  w/ revision 02-14-2017   left braniocephilic   Shortness of breath    Trigger thumb, right thumb    Umbilical hernia    Vitamin D deficiency    Past Surgical History:  Procedure Laterality Date   A/V FISTULAGRAM N/A 04/01/2021   Procedure: A/V FISTULAGRAM;  Surgeon: Kayla Part, MD;  Location: [facility] INVASIVE CV LAB;  Service: Cardiovascular;  Laterality: N/A;   ABDOMINAL HYSTERECTOMY  07/11/2000   dr gott   TAH/BSO for irregular bleeding and leiomyoma   AV FISTULA PLACEMENT Left 07/04/2015   Procedure: ARTERIOVENOUS (AV) FISTULA CREATION-LEFT;  Surgeon: Palma Bob, MD;  Location: [facility] OR;  Service: Vascular;  Laterality: Left;   BREAST EXCISIONAL BIOPSY Right    benign   CARPAL TUNNEL RELEASE Bilateral 1993 and 1994   CESAREAN SECTION  1993:  09-25-1999   BILATERAL TUBAL LIGATION W/ LAST C/S   DILATION AND CURETTAGE OF UTERUS      EXPLORATORY LAPARTOMY / LYSIS ADHESIONS/  BILATERAL SALPINGOOPHORECTOMY  07-20-2011  dr s. Georgena Kingdom  [facility]   FISTULA SUPERFICIALIZATION Left 08/28/2015   Procedure: BRACHIOCEPHALIC FISTULA SUPERFICIALIZATION;  Surgeon: Palma Bob, MD;  Location: [facility] OR;  Service: Vascular;  Laterality: Left;   KIDNEY TRANSPLANT  12/17/2021   PATCH ANGIOPLASTY Left 02/14/2017   Procedure: PATCH ANGIOPLASTY;  Surgeon: Richrd Char, MD;  Location: [facility] OR;  Service: Vascular;  Laterality: Left;   PELVIC LAPAROSCOPY  1994   exploration for  ectopic preg.   PERIPHERAL VASCULAR BALLOON ANGIOPLASTY  04/01/2021   Procedure: PERIPHERAL VASCULAR BALLOON ANGIOPLASTY;  Surgeon: Kayla Part, MD;  Location: [facility] INVASIVE CV LAB;  Service: Cardiovascular;;   RETINAL DETACHMENT SURGERY Left 2015   REVISON OF ARTERIOVENOUS FISTULA Left 02/14/2017   Procedure: REVISON OF ARTERIOVENOUS FISTULA  LEFT ARM;  Surgeon: Richrd Char, MD;  Location: [facility] OR;  Service: Vascular;  Laterality: Left;   REVISON OF ARTERIOVENOUS FISTULA Left 07/24/2021   Procedure: REVISON OF ARTERIOVENOUS FISTULA ARM;  Surgeon: Dannis Dy, MD;  Location: [facility] OR;  Service: Vascular;  Laterality: Left;   TRIGGER FINGER RELEASE Left 2015   thumb, also done in 2022   TRIGGER FINGER RELEASE Right 09/09/2017   Procedure: RELEASE TRIGGER FINGER/A-1 PULLEY RIGHT THUMB;  Surgeon: Neil Balls, MD;  Location: [facility] ;  Service: Orthopedics;  Laterality: Right;   Social History   Socioeconomic History   Marital status: Married    Spouse name: Not on file   Number of children: 2   Years of education: Not on file   Highest education level: Some college, no degree  Occupational History   Occupation: medical billing and insurance  Tobacco Use   Smoking status: Never   Smokeless tobacco: Never  Vaping Use   Vaping status: Never Used  Substance and Sexual Activity   Alcohol use: Yes    Comment: socially   Drug use: No    Sexual activity: Yes    Birth control/protection: Surgical    Comment: HYST-1st intercourse 53 yo-Fewer than 5 partners  Other Topics Concern   Not on file  Social History Narrative   Not on file   Social Drivers of Health   Financial Resource Strain: Low Risk  (05/19/2023)   Overall Financial Resource Strain (CARDIA)    Difficulty of Paying Living Expenses: Not very hard  Food Insecurity: No Food Insecurity (08/25/2023)   Hunger Vital Sign    Worried About Running Out of Food in the Last Year: Never true    Ran Out of Food in the Last Year: Never true  Transportation Needs: No Transportation Needs (08/25/2023)   PRAPARE - Administrator, Civil Service (Medical): No    Lack of Transportation (Non-Medical): No  Physical Activity: Insufficiently Active (05/19/2023)   Exercise Vital Sign    Days of Exercise per Week: 2 days    Minutes of Exercise per Session: 20 min  Stress: Stress Concern Present (05/19/2023)   [employer] of Occupational Health - Occupational Stress Questionnaire    Feeling of Stress : Very much  Social Connections: Moderately Integrated (05/19/2023)   Social Connection and Isolation Panel [NHANES]    Frequency of Communication with Friends and Family: More than three times a week    Frequency of Social Gatherings with Friends and Family: Once a week    Attends Religious Services: Never    Database administrator or Organizations: Yes    Attends Engineer, structural: More than 4 times per year    Marital Status: Married  Catering manager Violence: Not At Risk (08/25/2023)   Humiliation, Afraid, Rape, and Kick questionnaire    Fear of Current or Ex-Partner: No    Emotionally Abused: No    Physically Abused: No    Sexually Abused: No   Current Outpatient Medications on File Prior to Visit  Medication Sig Dispense Refill   albuterol  (VENTOLIN  HFA) 108 (90 Base) MCG/ACT inhaler Inhale 2 puffs into  the lungs every 6 (six) hours as needed for  wheezing or shortness of breath. 8 g 2   allopurinol  (ZYLOPRIM ) 100 MG tablet Take 1 tablet (100 mg total) by mouth daily. 90 tablet 2   aspirin EC 81 MG tablet Take 1 tablet by mouth daily.     Continuous Glucose Sensor (FREESTYLE LIBRE 2 PLUS SENSOR) MISC 1 each by Does not apply route every 14 (fourteen) days. 6 each 1   furosemide  (LASIX ) 40 MG tablet Take 1 tablet (40 mg total) by mouth daily. 30 tablet 5   Insulin  Disposable Pump (OMNIPOD 5 G7 PODS, GEN 5,) MISC 1 each by Does not apply route every 3 (three) days. 30 each 0   Insulin  Glargine (BASAGLAR  KWIKPEN) 100 UNIT/ML 24 units in am and 14 in pm 45 mL 2   Insulin  Lispro-aabc (LYUMJEV  KWIKPEN) 100 UNIT/ML KwikPen INJECT 10 UNITS UNDER THE SKIN BEFORE BREAKFAST, LUNCH AND SUPPER PLUS YOUR SLIDING SCALE. BG < 150: GIVE NO ADDITIONAL INSULIN  BG 151-200: ADD 2 UNITS BG 201-250: ADD 4 UNITS BG 251-300: ADD 6 UNITS BG 301-350: ADD 8 UNITS BG 351-400: ADD 10 UNITS 30 mL 3   Insulin  Lispro-aabc (LYUMJEV ) 100 UNIT/ML SOLN Use up to 75 units via pump. 70 mL 2   Insulin  Pen Needle 32G X 4 MM MISC Use 2x a day 200 each 3   Microlet Lancets MISC Use as directed to check blood sugars 4 times per day dx: e11.22 300 each 2   NIFEdipine  (PROCARDIA -XL/NIFEDICAL-XL) 30 MG 24 hr tablet Take 1 tablet (30 mg total) by mouth daily. 90 tablet 3   omeprazole  (PRILOSEC) 20 MG capsule Take 1 capsule (20 mg total) by mouth daily. 90 capsule 3   pregabalin  (LYRICA ) 150 MG capsule One capsule po daily 90 capsule 1   rosuvastatin  (CRESTOR ) 20 MG tablet Take 1 tablet (20 mg total) by mouth daily. 90 tablet 3   sevelamer carbonate (RENVELA) 800 MG tablet Take 1,600 mg by mouth 3 (three) times daily. (Patient not taking: Reported on 08/25/2023)     sodium zirconium cyclosilicate (LOKELMA) 5 g packet Take by mouth. (Patient not taking: Reported on 05/26/2023)     sulfamethoxazole -trimethoprim  (BACTRIM ) 400-80 MG tablet Take 1 tablet by mouth 3 (three) times a week. Monday,  Wednesday, Friday     tirzepatide  (MOUNJARO ) 5 MG/0.5ML Pen Inject 5 mg into the skin once a week. 2 mL 0   valACYclovir  (VALTREX ) 1000 MG tablet Take 1 tablet (1,000 mg total) by mouth 2 (two) times daily. (Patient not taking: Reported on 05/26/2023) 14 tablet 0   No current facility-administered medications on file prior to visit.   No Known Allergies Family History  Problem Relation Age of Onset   Diabetes Mother    Hypertension Mother    Thyroid  disease Mother    Ovarian cancer Mother        dx 65s   Diabetes Father    Hypertension Father    Cancer Father        STOMACH   Stomach cancer Father 41   Breast cancer Maternal Aunt 46   Cancer Paternal Uncle        unknown type, dx >50   Stroke Maternal Grandmother    Stroke Maternal Grandfather    Cancer Paternal Grandfather        unknown type, mets to brain   Cancer Cousin        unknown type, dx 70s, paternal first cousin   Cancer Other  unknown type, father's first cousin   Cancer Other        unknown types, 4 of mother's first cousins   PE: BP 130/70   Pulse (!) 115   Ht 5\' 1"  (1.549 m)   Wt 227 lb 9.6 oz (103.2 kg)   SpO2 95%   BMI 43.00 kg/m  Wt Readings from Last 15 Encounters:  09/05/23 227 lb 9.6 oz (103.2 kg)  07/20/23 230 lb 6.4 oz (104.5 kg)  05/26/23 233 lb 12.8 oz (106.1 kg)  04/21/23 222 lb 4.8 oz (100.8 kg)  03/15/23 217 lb (98.4 kg)  02/22/23 210 lb (95.3 kg)  11/17/22 212 lb 6.4 oz (96.3 kg)  11/10/22 215 lb 12.8 oz (97.9 kg)  11/18/21 218 lb (98.9 kg)  11/03/21 220 lb 9.6 oz (100.1 kg)  09/09/21 224 lb 12.8 oz (102 kg)  07/25/21 218 lb 4.1 oz (99 kg)  04/20/21 220 lb 6.4 oz (100 kg)  04/13/21 219 lb 6.4 oz (99.5 kg)  04/01/21 217 lb (98.4 kg)   Constitutional: overweight, in NAD Eyes:  EOMI, no exophthalmos ENT: no neck masses, no cervical lymphadenopathy Cardiovascular: tachycardia, RR, No MRG, + periankle B edema Respiratory: CTA B Musculoskeletal: no deformities Skin:no  rashes Neurological: no tremor with outstretched hands  ASSESSMENT: 1. DM2, insulin -dependent, uncontrolled, with complications - ESRD, on HD, awaiting kidney transplant ([facility]) - DR - PN  06/24/2021: Glucose 135, C-peptide 10.9  2. HL  3.  Obesity class III  PLAN:  1. Patient with longstanding, uncontrolled, type 2 diabetes, very insulin  resistant, previously on U-500 concentrated insulin .  Then on a basal-bolus insulin  regimen at last visit, and currently on an insulin  pump.  We had to stop a GLP-1 receptor agonist due to high lipase.  Since last visit, Dr. Elnita Hai started her on Mounjaro .  I communicated with her and it appears that her previous high lipase was due to her ESRD.  Patient tolerates Mounjaro  well. - At last visit sugars were fluctuating mostly within the target range but with occasional hyperglycemic spikes after meals.  We discussed about using higher doses of Fiasp  for larger meals but otherwise we did not change her regimen.  She was able to obtain an OmniPod 5 since last visit but did not start it yet as she was not able to come for the pump start yet -She had another HbA1c obtained earlier this month which was even higher, at 9.2%. CGM interpretation: -At today's visit, we reviewed her CGM downloads: It appears that 37% of values are in target range (goal >70%), while 63% are higher than 180 (goal <25%), and 0% are lower than 70 (goal <4%).  The calculated average blood sugar is 222.  The projected HbA1c for the next 3 months (GMI) is 8.6%. -Reviewing the CGM trends, sugars are quite high, dropping abruptly overnight and then increasing significantly after breakfast and after the rest of the meals.  We did discuss that she absolutely needs to inject insulin  before the meals and she will start doing so.  I advised her to avoid giving the entire mealtime dose after the blood sugars are already high after meals to avoid significant fluctuations in blood sugars.  For now, I did  not recommend a change in insulin  doses but we did discuss about increasing the dose of Mounjaro .  She agrees with the plan.  Also, I am hoping that in the next few weeks she can start on the insulin  pump. - I suggested to:  Patient  Instructions  Please continue: - Basaglar  40-50 units 2x a day - Lyumjev  35-45 units before meals 2-3x a day BG < 150: give no additional insulin ;  151-200: add 2 units;  201-250: add 4 units;  251-300: add 6 units;  301-350: add 8 units;  351-400: add 10 units   Please increase: - Mounjaro  to 7.5 mg weekly  Start the pump.  Start all the boluses before meals.  Please come back in 3 months.  - advised to check sugars at different times of the day - 4x a day, rotating check times - advised for yearly eye exams >> she is UTD - return to clinic in 3 months  2. HL - Reviewed latest lipid panel from 05/2023: LDL above target, as are her triglycerides: Lab Results  Component Value Date   CHOL 210 (H) 05/26/2023   HDL 44 05/26/2023   LDLCALC 117 (H) 05/26/2023   TRIG 284 (H) 05/26/2023   CHOLHDL 4.8 (H) 05/26/2023  - Continues Crestor  20 mg daily without side effects, increased after the above results returned  3.  Obesity class III -She was going to the [facility] weight management clinic in the past, but she mentioned that the suggested diet was not a good fit for her due to hemodialysis -we had to stop Ozempic  as her lipase was very high, at 499, down from 523, but still very high.  Since last visit, PCP was able to successfully start her on Mounjaro  which she tolerates well.  We will increase the dose today. -She lost 7 pounds before last visit but since then she gained 10 pounds since then.  Emilie Harden, MD PhD [facility] Endocrinology

## 2023-09-05 NOTE — Patient Instructions (Addendum)
 Please continue: - Basaglar  40-50 units 2x a day - Lyumjev  35-45 units before meals 2-3x a day BG < 150: give no additional insulin ;  151-200: add 2 units;  201-250: add 4 units;  251-300: add 6 units;  301-350: add 8 units;  351-400: add 10 units   Please increase: - Mounjaro  to 7.5 mg weekly  Start the pump.  Start all the boluses before meals.  Please come back in 3 months.

## 2023-09-06 ENCOUNTER — Telehealth: Payer: Self-pay | Admitting: Pharmacist

## 2023-09-06 DIAGNOSIS — E1122 Type 2 diabetes mellitus with diabetic chronic kidney disease: Secondary | ICD-10-CM

## 2023-09-06 DIAGNOSIS — N184 Chronic kidney disease, stage 4 (severe): Secondary | ICD-10-CM

## 2023-09-06 NOTE — Addendum Note (Signed)
 Addended by: Geronimo Krabbe on: 09/06/2023 03:15 PM   Modules accepted: Orders

## 2023-09-06 NOTE — Progress Notes (Addendum)
   09/06/2023  Patient ID: Cassandra Campos, female   DOB: 1971-05-06, 54 y.o.   MRN: [id number]  Patient was called per request about CGM devices. She recently saw endocrinology and appears to be using the Dexcom G7.  Her medication list has Freestyle. Unfortunately, She did not answer her phone. HIPAA compliant message was left on her voicemail.   Geronimo Krabbe, PharmD, BCACP Clinical Pharmacist [phone]   Patient called me back.  She confirmed Dexcom is what is compatible with her insulin  pump.  She said she already has the Dexcom 7 but is waiting to use it until she has her appointment with Endocrinology to physically link her pump and the Dexcom.   Geronimo Krabbe, PharmD, BCACP Clinical Pharmacist [phone]

## 2023-09-07 ENCOUNTER — Ambulatory Visit: Admitting: Internal Medicine

## 2023-09-13 ENCOUNTER — Telehealth: Payer: Self-pay | Admitting: Pharmacist

## 2023-09-13 ENCOUNTER — Telehealth: Payer: Self-pay | Admitting: Nutrition

## 2023-09-13 DIAGNOSIS — E1122 Type 2 diabetes mellitus with diabetic chronic kidney disease: Secondary | ICD-10-CM

## 2023-09-13 NOTE — Telephone Encounter (Signed)
 LVM to call me to reschedule her missed pump training appointment

## 2023-09-13 NOTE — Progress Notes (Addendum)
   09/13/2023  Patient ID: Cassandra Campos, female   DOB: 1970-12-16, 53 y.o.   MRN: [id number]   Called Patient to follow up on her CGM. She said last week that she would be using Dexcom 7 as it was/is compatible with her insulin  pump and that she was just waiting on her appointment with Endo to get the two linked together.  Upon review of her chart, I do not see an upcoming appointment with endo until a few months from now.  In addition, Freestyle Libre CGM sensors are on her medication list.  If she is going to use Freestyle until she is able to change, we need to get her linked to the [facility]'s Dexcom Clarity portal.  Unfortunately, the Patient did not answer the phone. HIPAA compliant message was left on her voicemail.   Geronimo Krabbe, PharmD, BCACP Clinical Pharmacist [phone]

## 2023-09-15 ENCOUNTER — Encounter: Payer: Self-pay | Admitting: Internal Medicine

## 2023-09-15 ENCOUNTER — Ambulatory Visit: Admitting: Internal Medicine

## 2023-09-19 ENCOUNTER — Other Ambulatory Visit: Payer: Self-pay

## 2023-09-19 DIAGNOSIS — I150 Renovascular hypertension: Secondary | ICD-10-CM

## 2023-09-19 MED ORDER — NIFEDIPINE ER OSMOTIC RELEASE 30 MG PO TB24: ORAL_TABLET | ORAL | 0 refills | Status: AC

## 2023-09-20 ENCOUNTER — Telehealth: Payer: Self-pay | Admitting: Pharmacist

## 2023-09-20 DIAGNOSIS — Z794 Long term (current) use of insulin: Secondary | ICD-10-CM

## 2023-09-20 NOTE — Progress Notes (Signed)
   09/20/2023  Patient ID: Cassandra Campos, female   DOB: [birth year], 53 y.o.   MRN: [id number]  Patient was called for follow up.  Unfortunately, she did not answer her phone. HIPAA compliant message could not be left on her voicemail  because her voicemail box was full.  From chart review, she missed her insulin  pump training appointment.  Her Dexcom was going to be linked to her pump during this appointment as well.  Once Patient is able to use her Dexcom, I will get her linked to the [facility] Dexcom Clarity Portal.     HgA1c- Plan: Call patient back in 1-2 weeks.  Geronimo Krabbe, PharmD, BCACP Clinical Pharmacist [phone]

## 2023-09-21 ENCOUNTER — Encounter: Attending: Internal Medicine | Admitting: Nutrition

## 2023-09-21 ENCOUNTER — Telehealth: Payer: Self-pay | Admitting: Nutrition

## 2023-09-21 DIAGNOSIS — N184 Chronic kidney disease, stage 4 (severe): Secondary | ICD-10-CM | POA: Insufficient documentation

## 2023-09-21 DIAGNOSIS — E1122 Type 2 diabetes mellitus with diabetic chronic kidney disease: Secondary | ICD-10-CM | POA: Insufficient documentation

## 2023-09-21 DIAGNOSIS — Z794 Long term (current) use of insulin: Secondary | ICD-10-CM | POA: Insufficient documentation

## 2023-09-21 NOTE — Telephone Encounter (Signed)
 SHe has started her Omni Pod.  She will be using 176u/day   Please order OmniPod 5 pods 1 q 1.3 days.  Do you want her to try the U-200 insulin ?

## 2023-09-22 ENCOUNTER — Other Ambulatory Visit: Payer: Self-pay

## 2023-09-22 ENCOUNTER — Other Ambulatory Visit: Payer: Self-pay | Admitting: Internal Medicine

## 2023-09-22 ENCOUNTER — Ambulatory Visit: Admitting: Internal Medicine

## 2023-09-22 MED ORDER — OMNIPOD 5 G7 PODS (GEN 5) MISC: 3.0000 | 3 refills | Status: DC

## 2023-09-22 NOTE — Progress Notes (Deleted)
 I,Marbeth Smedley T Basil Lim, CMA,acting as a Neurosurgeon for Smiley Dung, MD.,have documented all relevant documentation on the behalf of Smiley Dung, MD,as directed by  Smiley Dung, MD while in the presence of Smiley Dung, MD.  Subjective:  Patient ID: Cassandra Campos , female    DOB: 21-Jun-1970 , 53 y.o.   MRN: [id number]  No chief complaint on file.   HPI  HPI   Past Medical History:  Diagnosis Date  . Anemia associated with chronic renal failure   . ASCUS of cervix with negative high risk HPV 06/2017  . Back pain   . Chest pain   . CHF (congestive heart failure) (HCC)   . CKD (chronic kidney disease), stage IV (HCC) no dialysis yet   nephrologist-  dr Verlena Glenn-- scheduled next visit 09-08-2017 (lov 07-05-2017)  . Constipation   . Diabetic retinopathy (HCC)   . Dialysis patient (HCC)   . Edema, lower extremity   . Elevated transaminase level   . Family history of breast cancer   . Family history of ovarian cancer   . Family history of stomach cancer   . Fatty liver    FOLLOWED BY DR Elvin Hammer  . GERD (gastroesophageal reflux disease)   . Hypertension   . Idiopathic gout of multiple sites    09-05-2017 per pt stable  . Insulin  dependent type 2 diabetes mellitus (HCC)    followed by dr Deborra Falter  . Joint pain   . 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 Mixed hyperlipidemia   . OSA (obstructive sleep apnea)    per study 10-20-2015 mild osa w/ AHI 10.3/hr;  09-05-2017 per pt has not used cpap since 1 yr ago  . Palpitations   . Peripheral neuropathy    feet  . Pure hypercholesterolemia 05/21/2021  . S/P arteriovenous (AV) fistula creation 07-04-2015  w/ revision 02-14-2017   left braniocephilic  . Shortness of breath   . Trigger thumb, right thumb   . Umbilical hernia   . Vitamin D deficiency      Family History  Problem Relation Age of Onset  . Diabetes Mother   . Hypertension Mother   . Thyroid  disease Mother   . Ovarian  cancer Mother        dx 45s  . Diabetes Father   . Hypertension Father   . Cancer Father        STOMACH  . Stomach cancer Father 78  . Breast cancer Maternal Aunt 37  . Cancer Paternal Uncle        unknown type, dx >50  . Stroke Maternal Grandmother   . Stroke Maternal Grandfather   . Cancer Paternal Grandfather        unknown type, mets to brain  . Cancer Cousin        unknown type, dx 64s, paternal first cousin  . Cancer Other        unknown type, father's first cousin  . Cancer Other        unknown types, 4 of mother's first cousins     Current Outpatient Medications:  .  albuterol  (VENTOLIN  HFA) 108 (90 Base) MCG/ACT inhaler, Inhale 2 puffs into the lungs every 6 (six) hours as needed for wheezing or shortness of breath. (Patient not taking: Reported on 09/06/2023), Disp: 8 g, Rfl: 2 .  allopurinol  (ZYLOPRIM ) 100 MG tablet, Take 1 tablet (100 mg total) by mouth daily., Disp: 90 tablet, Rfl: 2 .  amitriptyline (ELAVIL) 25 MG tablet, Take 25 mg by mouth at bedtime., Disp: , Rfl:  .  aspirin EC 81 MG tablet, Take 1 tablet by mouth daily., Disp: , Rfl:  .  Continuous Glucose Sensor (FREESTYLE LIBRE 2 PLUS SENSOR) MISC, 1 each by Does not apply route every 14 (fourteen) days., Disp: 6 each, Rfl: 1 .  fluconazole  (DIFLUCAN ) 150 MG tablet, TAKE 1 TABLET BY MOUTH EVERY 3 DAYS. (Patient not taking: Reported on 09/06/2023), Disp: 2 tablet, Rfl: 0 .  furosemide  (LASIX ) 80 MG tablet, Take 80 mg by mouth 2 (two) times daily., Disp: , Rfl:  .  Insulin  Disposable Pump (OMNIPOD 5 G7 PODS, GEN 5,) MISC, 3 each by Does not apply route every 3 (three) days. (Change 1 pod every 1-1.3 days), Disp: 90 each, Rfl: 3 .  Insulin  Glargine (BASAGLAR  KWIKPEN) 100 UNIT/ML, 24 units in am and 14 in pm, Disp: 45 mL, Rfl: 2 .  Insulin  Lispro-aabc (LYUMJEV  KWIKPEN) 100 UNIT/ML KwikPen, INJECT 10 UNITS UNDER THE SKIN BEFORE BREAKFAST, LUNCH AND SUPPER PLUS YOUR SLIDING SCALE. BG < 150: GIVE NO ADDITIONAL INSULIN  BG  151-200: ADD 2 UNITS BG 201-250: ADD 4 UNITS BG 251-300: ADD 6 UNITS BG 301-350: ADD 8 UNITS BG 351-400: ADD 10 UNITS, Disp: 30 mL, Rfl: 3 .  Insulin  Lispro-aabc (LYUMJEV ) 100 UNIT/ML SOLN, Use up to 75 units via pump. (Patient not taking: Reported on 09/06/2023), Disp: 70 mL, Rfl: 2 .  Insulin  Pen Needle 32G X 4 MM MISC, Use 2x a day, Disp: 200 each, Rfl: 3 .  methenamine (HIPREX) 1 g tablet, Take 1 g by mouth daily., Disp: , Rfl:  .  Microlet Lancets MISC, Use as directed to check blood sugars 4 times per day dx: e11.22, Disp: 300 each, Rfl: 2 .  NIFEdipine  (PROCARDIA -XL/NIFEDICAL-XL) 30 MG 24 hr tablet, Take 2 tablets by mouth in the morning and one tablet by mouth In the evening., Disp: 270 tablet, Rfl: 0 .  omeprazole  (PRILOSEC) 20 MG capsule, Take 1 capsule (20 mg total) by mouth daily., Disp: 90 capsule, Rfl: 3 .  pregabalin  (LYRICA ) 150 MG capsule, One capsule po daily, Disp: 90 capsule, Rfl: 1 .  rosuvastatin  (CRESTOR ) 20 MG tablet, Take 1 tablet (20 mg total) by mouth daily., Disp: 90 tablet, Rfl: 3 .  sevelamer carbonate (RENVELA) 800 MG tablet, Take 1,600 mg by mouth 3 (three) times daily. (Patient not taking: Reported on 09/06/2023), Disp: , Rfl:  .  sodium zirconium cyclosilicate (LOKELMA) 5 g packet, Take by mouth., Disp: , Rfl:  .  sulfamethoxazole -trimethoprim  (BACTRIM ) 400-80 MG tablet, Take 1 tablet by mouth 3 (three) times a week. Monday, Wednesday, Friday, Disp: , Rfl:  .  tacrolimus (PROGRAF) 1 MG capsule, Take 1 mg by mouth 2 (two) times daily., Disp: , Rfl:  .  tirzepatide  (MOUNJARO ) 7.5 MG/0.5ML Pen, Inject 7.5 mg into the skin once a week., Disp: 6 mL, Rfl: 3 .  valACYclovir  (VALTREX ) 1000 MG tablet, Take 1 tablet (1,000 mg total) by mouth 2 (two) times daily., Disp: 14 tablet, Rfl: 0   No Known Allergies   Review of Systems  Constitutional: Negative.   Respiratory: Negative.    Cardiovascular: Negative.   Neurological: Negative.   Psychiatric/Behavioral: Negative.       There were no vitals filed for this visit. There is no height or weight on file to calculate BMI.  Wt Readings from Last 3 Encounters:  09/05/23 227 lb 9.6 oz (103.2 kg)  07/20/23  230 lb 6.4 oz (104.5 kg)  05/26/23 233 lb 12.8 oz (106.1 kg)     Objective:  Physical Exam      Assessment And Plan:  There are no diagnoses linked to this encounter.   No follow-ups on file.  Patient was given opportunity to ask questions. Patient verbalized understanding of the plan and was able to repeat key elements of the plan. All questions were answered to their satisfaction.  Smiley Dung, MD  I, Smiley Dung, MD, have reviewed all documentation for this visit. The documentation on 09/22/23 for the exam, diagnosis, procedures, and orders are all accurate and complete.   IF YOU HAVE BEEN REFERRED TO A SPECIALIST, IT MAY TAKE 1-2 WEEKS TO SCHEDULE/PROCESS THE REFERRAL. IF YOU HAVE NOT HEARD FROM US /SPECIALIST IN TWO WEEKS, PLEASE GIVE US  A CALL AT [phone] X [phone].   THE PATIENT IS ENCOURAGED TO PRACTICE SOCIAL DISTANCING DUE TO THE COVID-19 PANDEMIC.

## 2023-09-26 ENCOUNTER — Telehealth: Payer: Self-pay | Admitting: *Deleted

## 2023-09-26 ENCOUNTER — Telehealth: Payer: Self-pay | Admitting: Nutrition

## 2023-09-26 NOTE — Telephone Encounter (Signed)
 Patient reported that he dexcom readings would not go to her pdm.  She called Omni[employer], but they were not able to help her.  Said she is putting readings into the pDM when bolusing.  No difficulty bolusing.   Appointment scheduled for Tuesday at 4PM to try to link dexcom to her pdm

## 2023-09-26 NOTE — Progress Notes (Signed)
 Patient was trained on the use of the OmniPod 5 pump.  Settings were put in by myself, per orders from Dr. Rosalea Collin:  Basal rate1.8u/hr, target: 110 with correction over 110, I/C: 1/1, ISF: 30, duration: 4 hours, max bolus: 30 ,  max basal: 3.2u/hr.  She takes: 30u for breakfast, 35-45 for lunch and supper.  She was told to take 25u acB, and 30u acL and supper.   Her Dexcom G7 was linked to her PDM and to Clarity.  The PDM was linked to [place]:  Password:: [id number]!, USer name: [id number] We reviewed how this pump delivers insulin , how to give a bolus, how/when to do a correction dose as well as high blood sugar protocols.  WE also reviewed all topics on the pump training checklist and sighed this indication good understanding.

## 2023-09-26 NOTE — Progress Notes (Signed)
 Complex Care Management Care Guide Note  09/26/2023 Name: Cassandra Campos MRN: [id number] DOB: 05/13/1971  Cassandra Campos is a 53 y.o. year old female who is a primary care patient of Cleave Curling, MD and is actively engaged with the care management team. I reached out to [facility] by phone today to assist with re-scheduling  with the RN Case Manager.  Follow up plan: Telephone appointment with complex care management team member scheduled for:  10/27/23  Barnie Bora  [facility]  Value[facility]Based [facility], [facility] Guide  Direct Dial: [phone]  Fax [phone]

## 2023-09-26 NOTE — Progress Notes (Signed)
 Complex Care Management Care Guide Note  09/26/2023 Name: Shelika Spafford MRN: [id number] DOB: [birth year]  Darilyn Edin Abler is a 53 y.o. year old female who is a primary care patient of Cleave Curling, MD and is actively engaged with the care management team. I reached out to [facility] by phone today to assist with re-scheduling  with the RN Case Manager.  Follow up plan: Unsuccessful telephone outreach attempt made. A HIPAA compliant phone message was left for the patient providing contact information and requesting a return call.  Barnie Bora  [facility]  Value-Based Care Institute, [facility] Guide  Direct Dial: [phone]  Fax [phone]

## 2023-09-26 NOTE — Patient Instructions (Signed)
 Give bolus for all meals and snacks.   Do a correction dose on all blood sugars over 250. Change pod every 2 days Change sensor every 10 days Call OmniPod help line if questions about pump Call Dexcom help line if questions about sensor

## 2023-09-27 ENCOUNTER — Other Ambulatory Visit: Payer: Self-pay | Admitting: Internal Medicine

## 2023-09-27 ENCOUNTER — Encounter: Admitting: Nutrition

## 2023-09-27 DIAGNOSIS — E1122 Type 2 diabetes mellitus with diabetic chronic kidney disease: Secondary | ICD-10-CM

## 2023-09-27 MED ORDER — LYUMJEV KWIKPEN 200 UNIT/ML ~~LOC~~ SOPN: PEN_INJECTOR | SUBCUTANEOUS | 11 refills | Status: DC

## 2023-09-29 ENCOUNTER — Other Ambulatory Visit: Payer: Self-pay | Admitting: Internal Medicine

## 2023-09-29 ENCOUNTER — Telehealth: Payer: Self-pay

## 2023-09-29 NOTE — Telephone Encounter (Signed)
 Pt needs PA for Lyumjev 

## 2023-10-03 ENCOUNTER — Telehealth: Payer: Self-pay

## 2023-10-03 ENCOUNTER — Other Ambulatory Visit (HOSPITAL_COMMUNITY): Payer: Self-pay

## 2023-10-03 NOTE — Progress Notes (Signed)
 Patient had  difficulty with sensor not connecting with pump.  She called the pump company and got no help from them and after 3 hours, the sensor finally connected.  She had questions about how to bolus.  We reviewed the steps and when to do this, as well as how to do a corrections dose.  She assured me that she feels more comfortable to doing this now. We reviewed high blood sugar protocols, sick day guidelines and alerts and alarms.  She had no final questions.

## 2023-10-03 NOTE — Patient Instructions (Addendum)
 1.Make sure sensor and pod are on the same side of the body 2.When giving a bolus, fill in carb box with units of insulin  needed for meal and put blood sugar into the bg box by pressing "use sensor" . Press 'add to calculator', then confirm and start. 3. Do a correction dose whenever the blood sugar is over 250.  (Do as above, but do not put anything into the carb box.

## 2023-10-03 NOTE — Telephone Encounter (Signed)
 Pharmacy Patient Advocate Encounter   Received notification from Pt Calls Messages that prior authorization for Lyumjev  is required/requested.   Insurance verification completed.   The patient is insured through [employer] .   Per test claim:  Humalog is preferred by the insurance.  If suggested medication is appropriate, Please send in a new RX and discontinue this one. If not, please advise as to why it's not appropriate so that we may request a Prior Authorization. Please note, some preferred medications may still require a PA.  If the suggested medications have not been trialed and there are no contraindications to their use, the PA will not be submitted, as it will not be approved.   If I am reading her chart right, it looks like Lyumjev  was discontinued for Humalog already. Please advise

## 2023-10-04 ENCOUNTER — Telehealth: Payer: Self-pay | Admitting: Pharmacist

## 2023-10-04 DIAGNOSIS — E1122 Type 2 diabetes mellitus with diabetic chronic kidney disease: Secondary | ICD-10-CM

## 2023-10-04 NOTE — Progress Notes (Signed)
   10/04/2023  Patient ID: Cassandra Campos, female   DOB: [birth year], 53 y.o.   MRN: [id number]  Patient was called to follow up on Dexcom G7.  Patient answered the Phone but said she could not talk and would call me right back.  I did not receive a call from the Patient. From chart review, she received education on the Dexcom from the Nutritionist and was able to get her pump synchronized.  Plan; Call patient back in 1 week.  Attempt to link her to the [facility] Dexcom Clarity Portal.  Geronimo Krabbe, PharmD, [facility] Clinical Pharmacist [phone]

## 2023-10-27 ENCOUNTER — Telehealth: Payer: Self-pay

## 2023-10-31 ENCOUNTER — Telehealth: Payer: Self-pay | Admitting: Pharmacist

## 2023-10-31 DIAGNOSIS — N184 Chronic kidney disease, stage 4 (severe): Secondary | ICD-10-CM

## 2023-10-31 NOTE — Progress Notes (Signed)
   10/31/2023  Patient ID: Cassandra Campos, female   DOB: 1970/10/24, 53 y.o.   MRN: [id number]   Patient was called for blood sugar review and to get her Dexcom G7 linked to the [facility] Clarity Portal.  Unfortunately, she did not answer the phone. HIPAA compliant message was left on her voicemail.  HgA1c-8 % in January Uses Basaglar  and Humalog in Omnipod Insulin  PUMP Mounjaro  7.5 mg weekly Uses Dexcom G7 CGM  ON rosuvastatin  20 mg last filled 10/28/23 for a 30 day supply--investigate 90 day supplies with PCP.  Upcoming appt 11/23/2023   Plan: Call patient back in 2 weeks.   Geronimo Krabbe, PharmD, BCACP Clinical Pharmacist [phone]'

## 2023-11-06 ENCOUNTER — Encounter: Payer: Self-pay | Admitting: Internal Medicine

## 2023-11-07 ENCOUNTER — Other Ambulatory Visit: Payer: Self-pay | Admitting: Internal Medicine

## 2023-11-07 MED ORDER — INSULIN LISPRO 100 UNIT/ML IJ SOLN: INTRAMUSCULAR | 2 refills | Status: DC

## 2023-11-09 ENCOUNTER — Other Ambulatory Visit: Payer: Self-pay | Admitting: Internal Medicine

## 2023-11-09 DIAGNOSIS — Z1231 Encounter for screening mammogram for malignant neoplasm of breast: Secondary | ICD-10-CM

## 2023-11-14 ENCOUNTER — Ambulatory Visit: Admitting: Internal Medicine

## 2023-11-15 ENCOUNTER — Ambulatory Visit
Admission: RE | Admit: 2023-11-15 | Discharge: 2023-11-15 | Disposition: A | Discharge status: Home / Self Care | Source: Ambulatory Visit

## 2023-11-15 DIAGNOSIS — Z1231 Encounter for screening mammogram for malignant neoplasm of breast: Secondary | ICD-10-CM

## 2023-11-23 ENCOUNTER — Encounter: Payer: Medicare Other | Admitting: Internal Medicine

## 2023-11-25 ENCOUNTER — Telehealth: Payer: Self-pay

## 2023-11-25 ENCOUNTER — Telehealth: Payer: Self-pay | Admitting: Pharmacist

## 2023-11-25 DIAGNOSIS — E1122 Type 2 diabetes mellitus with diabetic chronic kidney disease: Secondary | ICD-10-CM

## 2023-11-25 NOTE — Telephone Encounter (Signed)
 Pt needs a PA for Lyumjev . She is not tolerating the Novolog  . She is having high glucose readings in the 300s.

## 2023-11-25 NOTE — Progress Notes (Addendum)
   11/25/2023  Patient ID: Cassandra Campos, female   DOB: 1971/04/23, 53 y.o.   MRN: [id number]   Patient was called to follow up on Dexcom linking and blood glucose values.  Unfortunately, she did not answer her phone. HIPAA compliant message was left on her voicemail.   She is followed by Endocrinology and has an insulin  pump.  Most recent HgA1c- 8% from January 2025  (needs new values) Upcoming endocrinology appointment on 12/22/23  On Rosuvastatin  20 mg last filled 10/28/23 for a 30 day supply.--Having a 90 day supply dispensed with help with adherence and decrease pharmacy trips for the Patient.  Plan: Call patient back in 1 month as she has been called unsuccessfully multiple times. Reach out to Dr. Everlyn CMA about the Patient getting an appointment scheduled as she does not have a PCP follow up.  Cassius DOROTHA Brought, PharmD, BCACP Clinical Pharmacist [phone]   Patient called me back. HIPAA identifiers were obtained.      I was able to walk the Patient through the process of getting her Dexcom linked to the clinic's Clarity Portal Page.    Her report is as follows:     Patient said she does not feel like Novolog  is working as well as Fiasp .  Fiasp  contains Niacinamide and L-arginine in addition to Insulin  Aspart the additional ingredients help it absob into the bloodstream more quickly than Novolog .  I could not find an HgA1c after January (8%).  She has an upcoming Endocrinology appointment on 12/21/24-Patient was instructed to call Dr. Vivian office to get an earlier appointment so her insulin  pump settings can be changed or she could be given a correction factor for boluses based on her carb intake.  Plan: Follow up with the Patient next week.   Cassius DOROTHA Brought, PharmD, BCACP Clinical Pharmacist [phone]

## 2023-11-28 ENCOUNTER — Other Ambulatory Visit (HOSPITAL_COMMUNITY): Payer: Self-pay

## 2023-11-28 ENCOUNTER — Telehealth: Payer: Self-pay

## 2023-11-28 NOTE — Telephone Encounter (Signed)
 Ok to increase Mounjaro ?

## 2023-11-28 NOTE — Telephone Encounter (Signed)
 Pharmacy Patient Advocate Encounter   Received notification from Pt Calls Messages that prior authorization for Lyumjev  KwikPen 100UNIT/ML pen-injectors is required/requested.   Insurance verification completed.   The patient is insured through [facility] [place] .   Per test claim: PA required; PA submitted to above mentioned insurance via CoverMyMeds Key/confirmation #/EOC [id number] Status is pending

## 2023-11-29 ENCOUNTER — Other Ambulatory Visit (HOSPITAL_COMMUNITY): Payer: Self-pay

## 2023-11-29 ENCOUNTER — Telehealth: Payer: Self-pay | Admitting: Pharmacy Technician

## 2023-11-29 MED ORDER — TIRZEPATIDE 10 MG/0.5ML ~~LOC~~ SOAJ: 10.0000 mg | SUBCUTANEOUS | 3 refills | Status: DC

## 2023-11-29 NOTE — Addendum Note (Signed)
 Addended by: CLEOTILDE ROLIN RAMAN on: 11/29/2023 09:05 AM   Modules accepted: Orders

## 2023-11-29 NOTE — Telephone Encounter (Signed)
 Pharmacy Patient Advocate Encounter   Received notification from CoverMyMeds that prior authorization for Mounjaro  10MG /0.5ML auto-injectors is required/requested.   Insurance verification completed.   The patient is insured through [employer] .   Per test claim: PA required, but patient has another insurance.  Insurance verification completed.   The patient is also insured through [employer] .   Per test claim: The current 44 day co-pay is, $4.80.  No PA needed at this time. This test claim was processed through [facility]- copay amounts may vary at other pharmacies due to pharmacy/plan contracts, or as the patient moves through the different stages of their insurance plan.     **Called the pharmacy and had them bill it with the Medicare insurance and they received the same paid claim.**  Archived Key: [id number]

## 2023-11-30 NOTE — Telephone Encounter (Signed)
 Pharmacy Patient Advocate Encounter  Received notification from [facility] that Prior Authorization for Lyumjev  KwikPen 100UNIT/ML pen-injectors has been APPROVED from 11/14/23 to 05/16/9998   PA #/Case ID/Reference #: [id number]

## 2023-12-01 ENCOUNTER — Other Ambulatory Visit: Payer: Self-pay

## 2023-12-01 ENCOUNTER — Telehealth: Payer: Self-pay

## 2023-12-01 NOTE — Telephone Encounter (Signed)
 Pt needs a PA for Lyumjev  Vials

## 2023-12-01 NOTE — Patient Outreach (Signed)
 Complex Care Management   Visit Note  12/01/2023  Name:  Cassandra Campos MRN: [id number] DOB: 06-11-1970  Situation: Referral received for Complex Care Management related to Diabetes with Complications, Chronic Kidney Disease, and Palpitations, Kidney transplant recipient, Obesity. I obtained verbal consent from Patient.  Visit completed with patient on the phone.  Background:   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   nephrologist-  dr montie dakins-- scheduled next visit 09-08-2017 (lov 07-05-2017)   Constipation    Diabetic retinopathy (HCC)    Dialysis patient (HCC)    Edema, lower extremity    Elevated transaminase level    Family history of breast cancer    Family history of ovarian cancer    Family history of stomach cancer    Fatty liver    FOLLOWED BY DR ABRAN   GERD (gastroesophageal reflux disease)    Hypertension    Idiopathic gout of multiple sites    09-05-2017 per pt stable   Insulin  dependent type 2 diabetes mellitus (HCC)    followed by dr vincente   Joint pain    Kidney transplant recipient 12/17/2021   [facility]   Mixed hyperlipidemia    OSA (obstructive sleep apnea)    per study 10-20-2015 mild osa w/ AHI 10.3/hr;  09-05-2017 per pt has not used cpap since 1 yr ago   Palpitations    Peripheral neuropathy    feet   Pure hypercholesterolemia 05/21/2021   S/P arteriovenous (AV) fistula creation 07-04-2015  w/ revision 02-14-2017   left braniocephilic   Shortness of breath    Trigger thumb, right thumb    Umbilical hernia    Vitamin D deficiency     Assessment: Patient Reported Symptoms:  Cognitive Cognitive Status: Alert and oriented to person, place, and time Cognitive/Intellectual Conditions  Management [RPT]: None reported or documented in medical history or problem list   Health Maintenance Behaviors: Annual physical exam, Healthy diet, Social activities, Exercise Healing Pattern: Fast Health Facilitated by: Healthy diet, Rest  Neurological Neurological Review of Symptoms: Other: [place] Neurological Symptoms/Conditions [RPT]: bilateral neuropathy to both legs Neurological Management Strategies: Medication therapy, Routine screening Neurological Self-Management Outcome: 4 (good)  HEENT HEENT Symptoms Reported: Eye dryness, Other: (raspy voice) HEENT Management Strategies: Routine screening (patient will discuss these symptoms with her PCP) HEENT Self-Management Outcome: 3 (uncertain)    Cardiovascular Cardiovascular Symptoms Reported: Swelling in legs or feet, Palpitations Does patient have uncontrolled Hypertension?: No Cardiovascular Management Strategies: Medication therapy, Routine screening Cardiovascular Self-Management Outcome: 4 (good)  Respiratory Respiratory Symptoms Reported: No symptoms reported Additional Respiratory Details: patient placed a call to [facility] to request her CPAP get serviced due to having water back up into the tubing Respiratory Management Strategies: CPAP, Routine screening Respiratory Self-Management Outcome: 3 (uncertain)  Endocrine Endocrine Symptoms Reported: No symptoms reported Is patient diabetic?: Yes Is patient checking blood sugars at home?: Yes List most recent blood sugar readings, include date and time of day: current BS 184 within 2 hours of eating Endocrine Self-Management Outcome: 4 (good)  Gastrointestinal Gastrointestinal Symptoms Reported: No symptoms reported      Genitourinary Genitourinary Symptoms Reported: No symptoms reported    Integumentary Integumentary Symptoms Reported: Itching Additional  Integumentary Details: mild itching around insulin  pump when dispencing Skin Management Strategies: Routine  screening Skin Self-Management Outcome: 3 (uncertain)  Musculoskeletal Musculoskelatal Symptoms Reviewed: No symptoms reported   Falls in the past year?: No Number of falls in past year: 1 or less Was there an injury with Fall?: No Fall Risk Category Calculator: 0 Patient Fall Risk Level: Low Fall Risk    Psychosocial Psychosocial Symptoms Reported: No symptoms reported   Major Change/Loss/Stressor/Fears (CP): Denies Quality of Family Relationships: involved, helpful, supportive Do you feel physically threatened by others?: No      12/01/2023    4:37 PM  Depression screen PHQ 2/9  Decreased Interest 0  Down, Depressed, Hopeless 0  PHQ - 2 Score 0    Vitals:   12/01/23 1635  BP: 136/78    Medications Reviewed Today     Reviewed by Morgan Clayborne CROME, RN (Registered Nurse) on 12/01/23 at 1618  Med List Status: <None>   Medication Order Taking? Sig Documenting Provider Last Dose Status Informant  albuterol  (VENTOLIN  HFA) 108 (90 Base) MCG/ACT inhaler [id number]  Inhale 2 puffs into the lungs every 6 (six) hours as needed for wheezing or shortness of breath.  Patient not taking: Reported on 12/01/2023   Georgina Speaks, FNP  Consider Medication Status and Discontinue (Completed Course) Self, Pharmacy Records  allopurinol  (ZYLOPRIM ) 100 MG tablet [id number] Yes Take 1 tablet (100 mg total) by mouth daily. Petrina Pries, NP  Active   amitriptyline (ELAVIL) 25 MG tablet [id number] Yes Take 25 mg by mouth at bedtime. [provider]  Active   aspirin EC 81 MG tablet [id number] Yes Take 1 tablet by mouth daily. [provider]  Active   Continuous Glucose Sensor (DEXCOM G7 SENSOR) MISC [id number] Yes 1 % by Does not apply route continuous. 1 each, Every 14 days [provider]  Active   Continuous Glucose Sensor (FREESTYLE LIBRE 2 PLUS SENSOR) MISC [id number]  1 each by Does not apply route every 14 (fourteen) days. Trixie File, MD  Consider Medication Status and  Discontinue (Change in therapy)   fluconazole  (DIFLUCAN ) 150 MG tablet [id number]  TAKE 1 TABLET BY MOUTH EVERY 3 DAYS.  Patient not taking: Reported on 09/06/2023   Chrzanowski, Jami B, NP  Active   furosemide  (LASIX ) 80 MG tablet [id number] Yes Take 80 mg by mouth 2 (two) times daily. [provider]  Active   insulin  aspart (NOVOLOG ) 100 UNIT/ML injection [id number]  Up to 200 units in the insulin  pump daily Trixie File, MD  Consider Medication Status and Discontinue (Change in therapy)   Insulin  Disposable Pump (OMNIPOD 5 G7 PODS, GEN 5,) MISC [id number] Yes 3 each by Does not apply route every 3 (three) days. (Change 1 pod every 1-1.3 days) Trixie File, MD  Active   Insulin  Glargine (BASAGLAR  [facility]) 100 UNIT/ML [id number]  24 units in am and 14 in pm Trixie File, MD  Consider Medication Status and Discontinue (Discontinued by provider)   Insulin  Lispro-aabc (LYUMJEV  KWIKPEN) 200 UNIT/ML KwikPen [id number] Yes 200 Units by Pump Prime route continuous. Patient is administering boluses as directed with meals [provider]  Active   Insulin  Pen Needle 32G X 4 MM MISC [id number]  Use 2x a day Trixie File, MD  Active Self, Pharmacy Records  methenamine (HIPREX) 1 g tablet [id number] Yes Take 1 g by mouth daily. [provider]  Active   Microlet Lancets MISC [id number]  Use as directed to check blood sugars 4 times per  day dx: e11.22 [name], MD  Active Self, Pharmacy Records  mycophenolate (MYFORTIC) 180 MG EC tablet [id number] Yes Take 180 mg by mouth 2 (two) times daily. Patient is taking 2 tablets twice daily [provider]  Active   NIFEdipine  (PROCARDIA -XL/NIFEDICAL-XL) 30 MG 24 hr tablet [id number] Yes Take 2 tablets by mouth in the morning and one tablet by mouth In the evening. [name], MD  Active   omeprazole  [facility]) 20 MG capsule [id number] Yes Take 1 capsule (20 mg total) by mouth daily. Petrina Pries, NP  Active    predniSONE  (DELTASONE ) 5 MG tablet [id number] Yes Take 5 mg by mouth daily with breakfast. [provider]  Active   pregabalin  (LYRICA ) 150 MG capsule [id number] Yes TAKE 1 CAPSULE BY MOUTH EVERY DAY [name], MD  Active   rosuvastatin  (CRESTOR ) 20 MG tablet [id number] Yes Take 1 tablet (20 mg total) by mouth daily. Petrina Pries, NP  Active   sevelamer carbonate (RENVELA) 800 MG tablet [id number]  Take 1,600 mg by mouth 3 (three) times daily.  Patient not taking: Reported on 09/06/2023   [provider]  Consider Medication Status and Discontinue (Change in therapy)   sodium zirconium cyclosilicate (LOKELMA) 5 g packet [id number]  Take by mouth. [provider]  Consider Medication Status and Discontinue (Discontinued by provider)   sulfamethoxazole -trimethoprim  (BACTRIM ) 400-80 MG tablet [id number] Yes Take 1 tablet by mouth 3 (three) times a week. Monday, Wednesday, Friday [provider]  Active   tacrolimus (PROGRAF) 1 MG capsule [id number] Yes Take 1 mg by mouth 2 (two) times daily. [provider]  Active   tirzepatide  (MOUNJARO ) 10 MG/0.5ML Pen [id number] Yes Inject 10 mg into the skin once a week.  Patient taking differently: Inject 10 mg into the skin once a week. Patient will start this dosage next week   Trixie File, MD  Active   valACYclovir  (VALTREX ) 1000 MG tablet [id number]  Take 1 tablet (1,000 mg total) by mouth 2 (two) times daily. [name], MD  Consider Medication Status and Discontinue (Completed Course)   Med List Note Anthoney Medici, MD 11/09/18 0911): Dialysis MWF           Recommendation:   Specialty provider follow-up Thursday, August 7 at 4:00 PM  Follow Up Plan:   Telephone follow up appointment date/time:  Wednesday, August 13 at 09:30 AM  Clayborne Ly RN BSN CCM [place]  [facility], [facility] Health Nurse Care Coordinator  Direct Dial: [phone] Website:  [email or web address]

## 2023-12-05 NOTE — Patient Instructions (Signed)
 Visit Information  Thank you for taking time to visit with me today. Please don't hesitate to contact me if I can be of assistance to you before our next scheduled appointment.  Our next appointment is by telephone on Wednesday, August 13 at 09:30 AM Please call the care guide team at [phone] if you need to cancel or reschedule your appointment.   Following is a copy of your care plan:   Goals Addressed             This Visit's Progress    COMPLETED: VBCI RN Care Plan       See new goal  Problems:  Chronic Disease Management support and education needs related to DMII  Goal: Over the next 90 days the Patient will continue to work with Medical illustrator and/or Social Worker to address care management and care coordination needs related to DMII as evidenced by adherence to CM Team Scheduled appointments     demonstrate Improved adherence to prescribed treatment plan for DMII as evidenced by documented decrease in A1C take all medications exactly as prescribed and will call provider for medication related questions as evidenced by patient reported adherence     Interventions:   Diabetes Interventions: Assessed patient's understanding of A1c goal: <7% Provided education to patient about basic DM disease process Counseled on importance of regular laboratory monitoring as prescribed Discussed plans with patient for ongoing care management follow up and provided patient with direct contact information for care management team Screening for signs and symptoms of depression related to chronic disease state  Assessed social determinant of health barriers Lab Results  Component Value Date   HGBA1C 8.0 (H) 05/26/2023    Patient Self-Care Activities:  schedule appointment with eye doctor check blood sugar at prescribed times: three times daily check feet daily for cuts, sores or redness manage portion size  Plan:  The patient has been provided with contact information for the  care management team and has been advised to call with any health related questions or concerns.           VBCI RN Care Plan related to CKD stage IV       Problems:  Chronic Disease Management support and education needs related to CKD Stage stage IV  Goal: Over the next 90 days the Patient will continue to work with Medical illustrator and/or Social Worker to address care management and care coordination needs related to CKD Stage IV as evidenced by adherence to care management team scheduled appointments      Interventions:    Chronic Kidney Disease Interventions: Assessed the Patient understanding of chronic kidney disease    Evaluation of current treatment plan related to chronic kidney disease self management and patient's adherence to plan as established by provider      Reviewed prescribed diet aim to drink 48-64 oz of water daily unless otherwise directed by  your doctor  Advised patient, providing education and rationale, to monitor blood pressure daily and record, calling PCP for findings outside established parameters    Provided education on kidney disease progression    Engage patient in early, proactive and ongoing discussion about goals of care and what matters most to them    Discussed plans with patient for ongoing care management follow up and provided patient with direct contact information for care management team    Last practice recorded BP readings:  BP Readings from Last 3 Encounters:  12/01/23 136/78  09/05/23 130/70  07/20/23 138/70  Most recent eGFR/CrCl:  Lab Results  Component Value Date   EGFR 23 (L) 07/20/2023    No components found for: CRCL  Patient Self-Care Activities:  Attend all scheduled provider appointments Call pharmacy for medication refills 3-7 days in advance of running out of medications Call provider office for new concerns or questions  Take medications as prescribed   Work with the nurse care manager to address care coordination  needs and will continue to work with the clinical team to address health care and disease management related needs  Plan:  Follow up with your transplant team as directed scheduled for 01/07/24 Telephone follow up appointment with care management team member scheduled for:  Wednesday, August 13 at 09:30 AM          VBCI RN Care Plan related to DMII       Problems:  Chronic Disease Management support and education needs related to DMII  Goal: Over the next 90 days the Patient will continue to work with Medical illustrator and/or Social Worker to address care management and care coordination needs related to DMII as evidenced by adherence to care management team scheduled appointments      Interventions:   Diabetes Interventions: Review of patient status, including review of consultants reports, relevant laboratory and other test results, and medications completed Assessed patient's understanding of A1c goal: <7% Reviewed medications with patient and completed medication reconciliation with patient, no discrepancies noted Assessed for symptoms related to hypo and hyperglycemia and importance of correct treatment Advised patient, providing education and rationale, to check cbg daily before meals and at bedtime and or if she feels her sugar is too high or too low and record, calling Dr. Trixie for findings outside established parameters Educated patient regarding the importance of keeping DMII well controlled, aiming for an A1c <7.0 to help maintain kidney function  Discussed plans with patient for ongoing care management follow up and provided patient with direct contact information for care management team Lab Results  Component Value Date   HGBA1C 8.0 (H) 05/26/2023    Patient Self-Care Activities:  Attend all scheduled provider appointments Call pharmacy for medication refills 3-7 days in advance of running out of medications Call provider office for new concerns or questions  Take  medications as prescribed   check blood sugar at prescribed times: before meals and at bedtime and when you have symptoms of low or high blood sugar check feet daily for cuts, sores or redness drink 6 to 8 glasses of water each day  Plan:  Follow up with provider re: DMII scheduled for 12/22/23 at 4:00 PM with Dr. Trixie as directed  Telephone follow up appointment with care management team member scheduled for:  Wednesday, August 13 at 09:30 AM             Please call 1-800-273-TALK (toll free, 24 hour hotline) if you are experiencing a Mental Health or Behavioral Health Crisis or need someone to talk to.  Patient verbalizes understanding of instructions and care plan provided today and agrees to view in MyChart. Active MyChart status and patient understanding of how to access instructions and care plan via MyChart confirmed with patient.     Clayborne Ly RN BSN CCM [place]  [facility], [facility] Health Nurse Care Coordinator  Direct Dial: [phone] Website: [email or web address]

## 2023-12-06 ENCOUNTER — Other Ambulatory Visit (HOSPITAL_COMMUNITY): Payer: Self-pay

## 2023-12-22 ENCOUNTER — Ambulatory Visit: Admitting: Internal Medicine

## 2023-12-23 ENCOUNTER — Encounter: Payer: Self-pay | Admitting: Internal Medicine

## 2023-12-23 NOTE — Telephone Encounter (Signed)
 I went to look this patient up in [place] and she has an account but not linked to the office correctly.   I called the [employer] rep [place] and he advised to me that we need her info so we can get her linked correctly and if she does not know her information he has sent 2 emails to her [employer] email to have these passwords reset.

## 2023-12-26 ENCOUNTER — Other Ambulatory Visit: Payer: Self-pay | Admitting: Internal Medicine

## 2023-12-26 MED ORDER — BD INSULIN SYRINGE U-500 31G X 6MM 0.5 ML MISC: 5 refills | Status: AC

## 2023-12-26 MED ORDER — BASAGLAR KWIKPEN 100 UNIT/ML ~~LOC~~ SOPN: 40.0000 [IU] | PEN_INJECTOR | Freq: Every day | SUBCUTANEOUS | 2 refills | Status: DC

## 2023-12-26 NOTE — Addendum Note (Signed)
 Addended by: CLEOTILDE ROLIN RAMAN on: 12/26/2023 11:09 AM   Modules accepted: Orders

## 2023-12-26 NOTE — Addendum Note (Signed)
 Addended by: CLEOTILDE ROLIN RAMAN on: 12/26/2023 09:07 AM   Modules accepted: Orders

## 2023-12-27 MED ORDER — INSULIN GLARGINE 100 UNIT/ML SOLOSTAR PEN: 40.0000 [IU] | PEN_INJECTOR | Freq: Every day | SUBCUTANEOUS | 3 refills | Status: DC

## 2023-12-27 NOTE — Addendum Note (Signed)
 Addended by: CLEOTILDE ROLIN RAMAN on: 12/27/2023 11:56 AM   Modules accepted: Orders

## 2023-12-28 ENCOUNTER — Telehealth

## 2024-01-03 ENCOUNTER — Other Ambulatory Visit: Payer: Self-pay | Admitting: Internal Medicine

## 2024-01-03 DIAGNOSIS — I150 Renovascular hypertension: Secondary | ICD-10-CM

## 2024-01-12 ENCOUNTER — Encounter: Payer: Self-pay | Admitting: Internal Medicine

## 2024-01-12 ENCOUNTER — Ambulatory Visit: Admitting: Internal Medicine

## 2024-01-12 VITALS — BP 130/70 | HR 99 | Ht 61.0 in | Wt 221.0 lb

## 2024-01-12 DIAGNOSIS — Z794 Long term (current) use of insulin: Secondary | ICD-10-CM | POA: Diagnosis not present

## 2024-01-12 DIAGNOSIS — E785 Hyperlipidemia, unspecified: Secondary | ICD-10-CM

## 2024-01-12 DIAGNOSIS — Z6838 Body mass index (BMI) 38.0-38.9, adult: Secondary | ICD-10-CM

## 2024-01-12 DIAGNOSIS — E1122 Type 2 diabetes mellitus with diabetic chronic kidney disease: Secondary | ICD-10-CM | POA: Diagnosis not present

## 2024-01-12 DIAGNOSIS — E66812 Obesity, class 2: Secondary | ICD-10-CM

## 2024-01-12 DIAGNOSIS — E1169 Type 2 diabetes mellitus with other specified complication: Secondary | ICD-10-CM

## 2024-01-12 DIAGNOSIS — Z992 Dependence on renal dialysis: Secondary | ICD-10-CM | POA: Diagnosis not present

## 2024-01-12 DIAGNOSIS — N186 End stage renal disease: Secondary | ICD-10-CM

## 2024-01-12 LAB — POCT GLYCOSYLATED HEMOGLOBIN (HGB A1C): Hemoglobin A1C: 8.2 % — AB (ref 4.0–5.6)

## 2024-01-12 MED ORDER — LYUMJEV KWIKPEN 200 UNIT/ML ~~LOC~~ SOPN: PEN_INJECTOR | SUBCUTANEOUS | 3 refills | Status: DC

## 2024-01-12 MED ORDER — OZEMPIC (2 MG/DOSE) 8 MG/3ML ~~LOC~~ SOPN: 2.0000 mg | PEN_INJECTOR | SUBCUTANEOUS | 3 refills | Status: DC

## 2024-01-12 NOTE — Progress Notes (Signed)
 Patient ID: Cassandra Campos, female   DOB: Aug 07, 1970, 53 y.o.   MRN: [id number]   HPI: Cassandra Campos is a 53 y.o.-year-old female, returning for follow-up for DM2, dx in 1997, insulin -dependent right after dx, uncontrolled, with complications (ESRD-on HD since 12/2017, DR, PN).  Last visit 4 months ago.  Interim hx: She had a kidney transplant 12/17/2021. Now on Prednisone  5 mg daily.  No increased urination, blurry vision, chest pain.  She was able to start the OmniPod insulin  pump since last visit.  She stopped it earlier this month as she was running out of insulin  sooner and the pump was giving her many alarms to change it while at work.  Reviewed HbA1c levels: 08/25/2023: HbA1c 9.2% Lab Results  Component Value Date   HGBA1C 8.0 (H) 05/26/2023   HGBA1C 6.7 (A) 03/15/2023   HGBA1C 7.1 (H) 11/17/2022   HGBA1C 7.5 (A) 09/09/2021   HGBA1C 6.3 (H) 02/02/2021   HGBA1C 8.5 (A) 07/30/2020   HGBA1C 7.9 (H) 01/08/2020   HGBA1C 8.7 (A) 09/11/2019   HGBA1C 10.9 (H) 04/19/2019   HGBA1C 6.9 (H) 11/09/2018  02/24/2021: HbA1c 6.8% 11/18/2020: HbA1c 8.9% 05/26/2020: HbA1c 9.8%  Previously on: - Ozempic  1 mg weekly -restarted 11/2020 >> 2 mg weekly >> stopped 08/2021 2/2 high lipase - U500 insulin  (started 2020): - 180-195 >> 200 >> 130-150 >> 140-200 units before breakfast  - 140-150 units before dinner if dinner is early, and 110-120 units if dinner is after dialysis >> 160-170 >> 140-160 >> 110-120 >> 110-140 units before dinner  Then on: - Mounjaro  5 mg weekly by PCP - started 07/2023.  Per my discussion with PCP, it appears that the previous high lipase could have been related to her end-stage renal disease. - Basaglar  24 units in a.m. and 14 units in p.m. >> 40-50 units 2x a day - Novolog  >> FiAsp  10 units 3x a day before meals, + SSI >> Lyumjev  35-45 units before meals 2-3x a day BG < 150: give no additional insulin ;  BG 151-200: add 2 units;  BG 201-250: add 4  units;  BG 251-300: add 6 units;  BG 301-350: add 8 units;  BG 351-400: add 10 units   Currently on: - Mounjaro  10 mg weekly - Basaglar  50-55 units daily - Lyumjev  40-45 units before meals  Prev.: Insulin  pump -OmniPod 5 G7: - Basal rates: 12 am: 1.85 units/h - Insulin  to carb ratio: 12 am:  1:1 - Target: 12 am: 110-110 - Correction factor (insulin  sensitivity factor):  12 am: 25 - Active insulin  time: 4h - Changes infusion site: q3 days  Total daily dose from basal insulin : 54% (43.2 units) Total daily dose from bolus insulin : 46% (37.1 units)  CGM: - Libre 2 plus >> now Dexcom G7  Pt checks her sugars more than 4 times a day with her CGM:   Previously:  Previously:  Lowest sugar was 52 >> 98; she has hypoglycemia awareness in the 60s. Highest sugar was 455 >>...321 >> 300s (pie).  Glucometer: Contour next  Pt's meals are: - Breakfast: yoghurt and blueberries; egg McMuffin - Lunch: fish, tacos, subs, cake - Dinner: salad, steak, shrimp - occasionally after HD at 10 pm, but more recently she has this during dialysis, around 7 PM - Snacks: fruit, peanuts  -+ H/o End-stage renal disease, now s/p kidney transplant-seeing nephrology, last BUN/creatinine:  12/29/2023: 21/2.18, GFR 27, glucose 128 Lab Results  Component Value Date   BUN 26 (H) 07/20/2023  BUN 32 (H) 05/26/2023   CREATININE 2.47 (H) 07/20/2023   CREATININE 2.86 (H) 05/26/2023   She was on hemodialysis previously.  -+ HL; last set of lipids: Lab Results  Component Value Date   CHOL 210 (H) 05/26/2023   HDL 44 05/26/2023   LDLCALC 117 (H) 05/26/2023   TRIG 284 (H) 05/26/2023   CHOLHDL 4.8 (H) 05/26/2023  06/24/2021: 130/299/42/42 On Crestor  20.  - last eye exam was 01/2023: + DR reportedly. + h/o retinal detachment, + cataracts.  She was getting IO injections, not recently >> now needs Laser Sx. She also sees Dr. Tobie (retina specialist).  - she has numbness and tingling in her feet.  She  changed from Lyrica  to Neurontin 300 mg, now back on Lyrica  - [facility].  Last foot exam 03/15/2023.  Pt has FH of DM in M, F.  She also has a history of NAFLD, OSA (but not on CPAP, since she could not tolerate it-now on nasal pillows), gout.   ROS:  + numbness and tingling in his feet  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   nephrologist-  dr montie dakins-- scheduled next visit 09-08-2017 (lov 07-05-2017)   Constipation    Diabetic retinopathy (HCC)    Dialysis patient (HCC)    Edema, lower extremity    Elevated transaminase level    Family history of breast cancer    Family history of ovarian cancer    Family history of stomach cancer    Fatty liver    FOLLOWED BY DR ABRAN   GERD (gastroesophageal reflux disease)    Hypertension    Idiopathic gout of multiple sites    09-05-2017 per pt stable   Insulin  dependent type 2 diabetes mellitus (HCC)    followed by dr vincente   Joint pain    Kidney transplant recipient 12/17/2021   [facility]   Mixed hyperlipidemia    OSA (obstructive sleep apnea)    per study 10-20-2015 mild osa w/ AHI 10.3/hr;  09-05-2017 per pt has not used cpap since 1 yr ago   Palpitations    Peripheral neuropathy    feet   Pure hypercholesterolemia 05/21/2021   S/P arteriovenous (AV) fistula creation 07-04-2015  w/ revision 02-14-2017   left braniocephilic   Shortness of breath    Trigger thumb, right thumb    Umbilical hernia    Vitamin D deficiency    Past Surgical History:  Procedure Laterality Date   A/V FISTULAGRAM N/A 04/01/2021   Procedure: A/V FISTULAGRAM;  Surgeon: Lanis Fonda BRAVO, MD;  Location: [facility] INVASIVE CV LAB;  Service: Cardiovascular;  Laterality: N/A;   ABDOMINAL HYSTERECTOMY  07/11/2000   dr  gott   TAH/BSO for irregular bleeding and leiomyoma   AV FISTULA PLACEMENT Left 07/04/2015   Procedure: ARTERIOVENOUS (AV) FISTULA CREATION-LEFT;  Surgeon: Lynwood JONETTA Collum, MD;  Location: [facility] OR;  Service: Vascular;  Laterality: Left;   BREAST BIOPSY Right    benign   CARPAL TUNNEL RELEASE Bilateral 1993 and 1994   CESAREAN SECTION  1993:  09-25-1999   BILATERAL TUBAL LIGATION W/ LAST C/S   DILATION AND CURETTAGE OF UTERUS     EXPLORATORY LAPARTOMY / LYSIS ADHESIONS/  BILATERAL SALPINGOOPHORECTOMY  07-20-2011  dr s. vonzell  [facility]   FISTULA SUPERFICIALIZATION Left 08/28/2015   Procedure: BRACHIOCEPHALIC FISTULA SUPERFICIALIZATION;  Surgeon: Lynwood JONETTA Collum, MD;  Location: [facility] OR;  Service: Vascular;  Laterality: Left;   KIDNEY TRANSPLANT  12/17/2021   PATCH ANGIOPLASTY Left 02/14/2017   Procedure: PATCH ANGIOPLASTY;  Surgeon: Harvey Carlin BRAVO, MD;  Location: [facility] OR;  Service: Vascular;  Laterality: Left;   PELVIC LAPAROSCOPY  1994   exploration for ectopic preg.   PERIPHERAL VASCULAR BALLOON ANGIOPLASTY  04/01/2021   Procedure: PERIPHERAL VASCULAR BALLOON ANGIOPLASTY;  Surgeon: Lanis Fonda BRAVO, MD;  Location: [facility] INVASIVE CV LAB;  Service: Cardiovascular;;   RETINAL DETACHMENT SURGERY Left 2015   REVISON OF ARTERIOVENOUS FISTULA Left 02/14/2017   Procedure: REVISON OF ARTERIOVENOUS FISTULA  LEFT ARM;  Surgeon: Harvey Carlin BRAVO, MD;  Location: [facility] OR;  Service: Vascular;  Laterality: Left;   REVISON OF ARTERIOVENOUS FISTULA Left 07/24/2021   Procedure: REVISON OF ARTERIOVENOUS FISTULA ARM;  Surgeon: Eliza Lonni RAMAN, MD;  Location: [facility] OR;  Service: Vascular;  Laterality: Left;   TRIGGER FINGER RELEASE Left 2015   thumb, also done in 2022   TRIGGER FINGER RELEASE Right 09/09/2017   Procedure: RELEASE TRIGGER FINGER/A-1 PULLEY RIGHT THUMB;  Surgeon: Yvone Rush, MD;  Location: [facility];  Service: Orthopedics;  Laterality: Right;   Social History   Socioeconomic History    Marital status: Married    Spouse name: Not on file   Number of children: 2   Years of education: Not on file   Highest education level: Some college, no degree  Occupational History   Occupation: medical billing and insurance  Tobacco Use   Smoking status: Never    Passive exposure: Current   Smokeless tobacco: Never  Vaping Use   Vaping status: Never Used  Substance and Sexual Activity   Alcohol use: Yes    Comment: socially   Drug use: No   Sexual activity: Yes    Birth control/protection: Surgical    Comment: HYST-1st intercourse 53 yo-Fewer than 5 partners  Other Topics Concern   Not on file  Social History Narrative   Not on file   Social Drivers of Health   Financial Resource Strain: Low Risk  (05/19/2023)   Overall Financial Resource Strain (CARDIA)    Difficulty of Paying Living Expenses: Not very hard  Food Insecurity: No Food Insecurity (12/01/2023)   Hunger Vital Sign    Worried About Running Out of Food in the Last Year: Never true    Ran Out of Food in the Last Year: Never true  Transportation Needs: No Transportation Needs (12/01/2023)   PRAPARE - Administrator, Civil Service (Medical): No    Lack of Transportation (Non-Medical): No  Physical Activity: Insufficiently Active (12/01/2023)   Exercise Vital Sign    Days of Exercise per Week: 3 days    Minutes of Exercise per Session: 20 min  Stress: No Stress Concern Present (12/01/2023)   [employer] of Occupational Health - Occupational Stress Questionnaire    Feeling of Stress: Not at all  Social Connections: Socially Integrated (12/01/2023)   Social Connection and Isolation Panel    Frequency of Communication with Friends and Family: More than three times a week    Frequency of Social Gatherings with Friends and Family: More than three times a week    Attends Religious Services: 1 to 4 times per year    Active Member of [employer]  or Organizations: Yes    Attends Banker  Meetings: More than 4 times per year    Marital Status: Married  Catering manager Violence: Not At Risk (12/01/2023)   Humiliation, Afraid, Rape, and Kick questionnaire    Fear of Current or Ex-Partner: No    Emotionally Abused: No    Physically Abused: No    Sexually Abused: No   Current Outpatient Medications on File Prior to Visit  Medication Sig Dispense Refill   albuterol  (VENTOLIN  HFA) 108 (90 Base) MCG/ACT inhaler Inhale 2 puffs into the lungs every 6 (six) hours as needed for wheezing or shortness of breath. (Patient not taking: Reported on 12/01/2023) 8 g 2   allopurinol  (ZYLOPRIM ) 100 MG tablet Take 1 tablet (100 mg total) by mouth daily. 90 tablet 2   amitriptyline (ELAVIL) 25 MG tablet Take 25 mg by mouth at bedtime.     aspirin EC 81 MG tablet Take 1 tablet by mouth daily.     Continuous Glucose Sensor (DEXCOM G7 SENSOR) MISC 1 % by Does not apply route continuous. 1 each, Every 14 days     Continuous Glucose Sensor (FREESTYLE LIBRE 2 PLUS SENSOR) MISC 1 each by Does not apply route every 14 (fourteen) days. 6 each 1   fluconazole  (DIFLUCAN ) 150 MG tablet TAKE 1 TABLET BY MOUTH EVERY 3 DAYS. (Patient not taking: Reported on 09/06/2023) 2 tablet 0   furosemide  (LASIX ) 80 MG tablet Take 80 mg by mouth 2 (two) times daily.     insulin  aspart (NOVOLOG ) 100 UNIT/ML injection Up to 200 units in the insulin  pump daily 180 mL 2   Insulin  Disposable Pump (OMNIPOD 5 G7 PODS, GEN 5,) MISC 3 each by Does not apply route every 3 (three) days. (Change 1 pod every 1-1.3 days) 90 each 3   insulin  glargine (LANTUS ) 100 UNIT/ML Solostar Pen Inject 40 Units into the skin daily. 30 mL 3   Insulin  Lispro-aabc (LYUMJEV  KWIKPEN) 200 UNIT/ML KwikPen 200 Units by Pump Prime route continuous. Patient is administering boluses as directed with meals     Insulin  Pen Needle 32G X 4 MM MISC Use 2x a day 200 each 3   Insulin  Syringe/Needle U-500 (BD INSULIN  SYRINGE U-500) 31G X 0.5 ML MISC Use to inject  insulin  3 times a day 300 each 5   methenamine (HIPREX) 1 g tablet Take 1 g by mouth daily.     Microlet Lancets MISC Use as directed to check blood sugars 4 times per day dx: e11.22 300 each 2   mycophenolate (MYFORTIC) 180 MG EC tablet Take 180 mg by mouth 2 (two) times daily. Patient is taking 2 tablets twice daily     NIFEdipine  (PROCARDIA -XL/NIFEDICAL-XL) 30 MG 24 hr tablet Take 2 tablets by mouth in the morning and one tablet by mouth In the evening. 270 tablet 0   omeprazole  (PRILOSEC) 20 MG capsule Take 1 capsule (20 mg total) by mouth daily. 90 capsule 3   predniSONE  (DELTASONE ) 5 MG tablet Take 5 mg by mouth daily with breakfast.     pregabalin  (LYRICA ) 150 MG capsule TAKE 1 CAPSULE BY MOUTH EVERY DAY 90 capsule 1   rosuvastatin  (CRESTOR ) 20 MG tablet Take 1 tablet (20 mg total) by mouth daily. 90 tablet 3   sevelamer carbonate (RENVELA) 800 MG tablet Take 1,600 mg by mouth 3 (three) times daily. (Patient not taking: Reported on 09/06/2023)     sodium zirconium cyclosilicate (LOKELMA) 5 g packet Take by mouth.  sulfamethoxazole -trimethoprim  (BACTRIM ) 400-80 MG tablet Take 1 tablet by mouth 3 (three) times a week. Monday, Wednesday, Friday     tacrolimus (PROGRAF) 1 MG capsule Take 1 mg by mouth 2 (two) times daily.     tirzepatide  (MOUNJARO ) 10 MG/0.5ML Pen Inject 10 mg into the skin once a week. (Patient taking differently: Inject 10 mg into the skin once a week. Patient will start this dosage next week) 6 mL 3   valACYclovir  (VALTREX ) 1000 MG tablet Take 1 tablet (1,000 mg total) by mouth 2 (two) times daily. 14 tablet 0   No current facility-administered medications on file prior to visit.   Allergies  Allergen Reactions   Novolog  [Insulin  Aspart]     Elevated glucose readings    Family History  Problem Relation Age of Onset   Diabetes Mother    Hypertension Mother    Thyroid  disease Mother    Ovarian cancer Mother        dx 35s   Diabetes Father    Hypertension Father     Cancer Father        STOMACH   Stomach cancer Father 5   Breast cancer Maternal Aunt 37   Cancer Paternal Uncle        unknown type, dx >50   Stroke Maternal Grandmother    Stroke Maternal Grandfather    Cancer Paternal Grandfather        unknown type, mets to brain   Cancer Cousin        unknown type, dx 35s, paternal first cousin   Cancer Other        unknown type, father's first cousin   Cancer Other        unknown types, 4 of mother's first cousins   PE: BP 130/70   Pulse 99   Ht 5' 1 (1.549 m)   Wt 221 lb (100.2 kg)   BMI 41.76 kg/m  Wt Readings from Last 15 Encounters:  01/12/24 221 lb (100.2 kg)  09/05/23 227 lb 9.6 oz (103.2 kg)  07/20/23 230 lb 6.4 oz (104.5 kg)  05/26/23 233 lb 12.8 oz (106.1 kg)  04/21/23 222 lb 4.8 oz (100.8 kg)  03/15/23 217 lb (98.4 kg)  02/22/23 210 lb (95.3 kg)  11/17/22 212 lb 6.4 oz (96.3 kg)  11/10/22 215 lb 12.8 oz (97.9 kg)  11/18/21 218 lb (98.9 kg)  11/03/21 220 lb 9.6 oz (100.1 kg)  09/09/21 224 lb 12.8 oz (102 kg)  07/25/21 218 lb 4.1 oz (99 kg)  04/20/21 220 lb 6.4 oz (100 kg)  04/13/21 219 lb 6.4 oz (99.5 kg)   Constitutional: overweight, in NAD Eyes:  EOMI, no exophthalmos ENT: no neck masses, no cervical lymphadenopathy Cardiovascular: Tachycardia, RR, No MRG, + periankle B edema Respiratory: CTA B Musculoskeletal: no deformities Skin:no rashes Neurological: no tremor with outstretched hands Diabetic Foot Exam - Simple   Simple Foot Form Diabetic Foot exam was performed with the following findings: Yes 01/12/2024  9:56 AM  Visual Inspection No deformities, no ulcerations, no other skin breakdown bilaterally: Yes Sensation Testing Intact to touch and monofilament testing bilaterally: Yes Pulse Check Posterior Tibialis and Dorsalis pulse intact bilaterally: Yes Comments    ASSESSMENT: 1. DM2, insulin -dependent, uncontrolled, with complications - ESRD, on HD, awaiting kidney transplant ([facility]) - DR -  PN  06/24/2021: Glucose 135, C-peptide 10.9  2. HL  3.  Obesity class III  PLAN:  1. Patient with longstanding, uncontrolled, type 2 diabetes, very insulin  resistant, previously  on U-500 concentrated insulin , but on high doses of U100 insulin  basal-bolus regimen at last visit.  Her HbA1c was higher than before, at 9.2% and sugars were quite high, dropping abruptly overnight and then increasing significantly after every meal.  We discussed about the need to inject insulin  before every meal but I also suggested an insulin  pump.  She was able to start the OmniPod 5 since last visit, but unfortunately she came off earlier this month as the reservoirs were not lasting her long enough and the pump was started to give her alarms while at work.  She mentions that she was using too much insulin  to last her for  a full 2 days.  However, reviewing the sensor and pump data from when she was wearing the pump, these were the blood sugars that she had in a long time, with an average of 172 and the predicted HbA1c of 7.6%. CGM interpretation: -At today's visit, we reviewed her CGM downloads for the last 2 weeks, off the pump: It appears that 30% of values are in target range (goal >70%), while 70% are higher than 180 (goal <25%), and 0% are lower than 70 (goal <4%).  The calculated average blood sugar is 223.  The projected HbA1c for the next 3 months (GMI) is 8.6%. -Reviewing the CGM trends, sugars remain elevated at all times of the day, only slightly improving overnight and then increasing after every meal, particularly after lunch. -At today's visit we discussed about trying to use U200 Lyumjev  in the pump and I changed her pump settings for the more concentrated insulin .  This will allow her pod to last for at least 2 days.  She agrees with this change.  I am hoping that this insulin  is still covered for her. -I did advise her that until she is able to go back on the OmniPod pump to increase the dose of Basaglar   since all of her blood sugars are elevated for now. -We also discussed about other times on the market, upon her questioning.  I recommended to look into the iLet and twiist insulin  pumps.  Discussed about how these work. -She also feels that Mounjaro  is not helping her too much.  The transplant clinic recommended Ozempic .  She feels that maybe Ozempic  was working better for her so at today's visit I advised her to switch back.  Will start with the lower dose, 1 mg weekly and then increase to the previous 2 mg dose as tolerated. - I suggested to:  Patient Instructions  Please change Mounjaro   - Ozempic  1 mg weekly (36 clicks on the 2 mg pen) and increase to 2 mg as tolerated  For now, please increase: - Basaglar  35-40 units 2x a day  Continue: - Lyumjev  40-45 units before meals  When ready, change back to the Omnipod pump with U200 Lyumjev  in the pump: - Basal rates: 12 am: 0.95 units/h - Insulin  to carb ratio: 12 am:  1:2 - Target: 12 am: 110-110 - Correction factor (insulin  sensitivity factor):  12 am: 50 - Active insulin  time: 4h  Start all the boluses before meals.  Look up: - iLET pump and twiist pump.  Please come back in 1.5 months.  - we checked her HbA1c: 8.2% (lower) - advised to check sugars at different times of the day - 4x a day, rotating check times - advised for yearly eye exams >> she is UTD - return to clinic in 1.5 months  2. HL - Latest  lipid panel was reviewed from 05/2023: LDL above target, triglycerides also elevated: Lab Results  Component Value Date   CHOL 210 (H) 05/26/2023   HDL 44 05/26/2023   LDLCALC 117 (H) 05/26/2023   TRIG 284 (H) 05/26/2023   CHOLHDL 4.8 (H) 05/26/2023  - She continues on Crestor  20 mg daily without side effects.  The dose was increased after the above results returned.  3.  Obesity class III -She was going to the [facility] weight management clinic in the past, but she mentioned that the suggested diet was not a good fit  for her due to hemodialysis -we had to stop Ozempic  as her lipase was very high, at 499, down from 523, but still very high.  Before our last visit, PCP was able to successfully start her on Mounjaro  which she tolerates well.  We will increase the dose today. - She gained 10 pounds before last visit.  We increased the Mounjaro  dose. - At today's visit, she lost 6 pounds since last visit.  However, she would be interested to go back to Ozempic .  Sent in new prescription.  I advised her to increase it as tolerated.  Lela Fendt, MD PhD [facility] Endocrinology

## 2024-01-12 NOTE — Patient Instructions (Addendum)
 Please change Mounjaro   - Ozempic  1 mg weekly (36 clicks on the 2 mg pen) and increase to 2 mg as tolerated  For now, please increase: - Basaglar  35-40 units 2x a day  Continue: - Lyumjev  40-45 units before meals  When ready, change back to the Omnipod pump with U200 Lyumjev  in the pump: - Basal rates: 12 am: 0.95 units/h - Insulin  to carb ratio: 12 am:  1:2 - Target: 12 am: 110-110 - Correction factor (insulin  sensitivity factor):  12 am: 50 - Active insulin  time: 4h  Start all the boluses before meals.   Look up: - iLET pump and twiist pump.  Please come back in 1.5 months.

## 2024-01-15 ENCOUNTER — Encounter: Payer: Self-pay | Admitting: Internal Medicine

## 2024-01-17 MED ORDER — OMNIPOD 5 G7 PODS (GEN 5) MISC
1.0000 | Freq: Every day | 3 refills | Status: DC
Start: 2024-01-17 — End: 2024-01-23

## 2024-01-18 ENCOUNTER — Encounter (HOSPITAL_COMMUNITY): Payer: Self-pay

## 2024-01-18 ENCOUNTER — Other Ambulatory Visit: Payer: Self-pay | Admitting: Vascular Surgery

## 2024-01-18 ENCOUNTER — Telehealth: Payer: Self-pay

## 2024-01-18 DIAGNOSIS — R200 Anesthesia of skin: Secondary | ICD-10-CM

## 2024-01-18 NOTE — Telephone Encounter (Addendum)
 Pt called and left message about her hand numbness that is affecting her job and her ability to drive. Pt had AVF placed in 2017 and had kidney transplant in 2023.  Called patient back, no answer but left message on her VM. Asked her to contact her nephrologist to make a new referral to [facility] and then we can start working to get her in to see Dr. Lanis. Dr. Lanis performed a fistulagram in 2023.    RE: AVF Received: Today Lanis Fonda BRAVO, MD  Addie Lolita DEL, RN Hey lets get her in. Sounds like steal syndrome. Lets get a fistula ultrasound and steal exam       Previous Messages    ----- Message ----- From: Addie Lolita DEL, RN Sent: 01/18/2024  10:05 AM EDT To: Fonda BRAVO Lanis, MD Subject: AVF                                            Hi Dr. Lanis,  Patient called c/o numbness to hand that is affecting her job and driving.  Looks like she had AVF placed in 2017 with several revisions in the past.  Lat seen by Dr. Eliza after you did a Fistulagram in 2023.  She had a kidney transplant in 2023.    I'm going to ask her to get a new referral from her nephrologist since she hasn't been seen by us  in a while.    Will she need any imaging?  Please advise.  Thanks, Lolita

## 2024-01-20 ENCOUNTER — Telehealth: Payer: Self-pay

## 2024-01-20 ENCOUNTER — Ambulatory Visit (HOSPITAL_COMMUNITY)
Admission: RE | Admit: 2024-01-20 | Discharge: 2024-01-20 | Disposition: A | Discharge status: Home / Self Care | Source: Ambulatory Visit | Attending: Vascular Surgery | Admitting: Vascular Surgery

## 2024-01-20 ENCOUNTER — Ambulatory Visit (HOSPITAL_COMMUNITY)

## 2024-01-20 DIAGNOSIS — R200 Anesthesia of skin: Secondary | ICD-10-CM | POA: Diagnosis present

## 2024-01-20 NOTE — Telephone Encounter (Signed)
 Pt needs PA for Omnipod PODs.  Insulin  Disposable Pump (OMNIPOD 5 G7 PODS, GEN 5,) MISC

## 2024-01-23 ENCOUNTER — Other Ambulatory Visit (HOSPITAL_COMMUNITY): Payer: Self-pay

## 2024-01-23 ENCOUNTER — Telehealth: Payer: Self-pay | Admitting: Pharmacy Technician

## 2024-01-23 MED ORDER — OMNIPOD 5 G7 PODS (GEN 5) MISC: 1.0000 | 11 refills | Status: DC

## 2024-01-23 NOTE — Telephone Encounter (Signed)
 PA request has been Received. New Encounter has been or will be created for follow up. For additional info see Pharmacy Prior Auth telephone encounter from 01/23/24.

## 2024-01-23 NOTE — Telephone Encounter (Signed)
New script has been sent

## 2024-01-23 NOTE — Telephone Encounter (Signed)
 Pharmacy Patient Advocate Encounter   Received notification from Pt Calls Messages that prior authorization for Omnipod 5 G7 pods is required/requested.   Insurance verification completed.   The patient is insured through [employer] .   Per test claim: The current 30 day co-pay is, $0.  No PA needed at this time. This test claim was processed through [facility]- copay amounts may vary at other pharmacies due to pharmacy/plan contracts, or as the patient moves through the different stages of their insurance plan.   I will call [facility] when they open to see what rejection they are getting and why they can't fill it. The prescription is written a little odd and not specific. Looks like she needs to change it every other day instead of every 3, based on her insulin  usage. Let me know if that's not right, but I got # 15 for a  day supply to go through for $0.

## 2024-01-24 NOTE — Progress Notes (Unsigned)
 Office Note     CC:  Concern for steal syndrome Requesting Provider:  [name], MD  HPI: Cassandra Campos is a 53 y.o. (05-Mar-1971) female presenting at the request of .[name], MD with concern for steal syndrome.  Cassandra Campos is well-known to our service having previously undergone access procedures from multiple [facility] physicians.  She is currently being dialyzed through a left arm brachiocephalic fistula which has undergone multiple revisions as well.     The pt is *** on a statin for cholesterol management.  The pt is *** on a daily aspirin.   Other AC:  *** The pt is *** on medication for hypertension.   The pt is *** diabetic.  Tobacco hx:  ***  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   nephrologist-  dr montie dakins-- scheduled next visit 09-08-2017 (lov 07-05-2017)   Constipation    Diabetic retinopathy (HCC)    Dialysis patient (HCC)    Edema, lower extremity    Elevated transaminase level    Family history of breast cancer    Family history of ovarian cancer    Family history of stomach cancer    Fatty liver    FOLLOWED BY DR ABRAN   GERD (gastroesophageal reflux disease)    Hypertension    Idiopathic gout of multiple sites    09-05-2017 per pt stable   Insulin  dependent type 2 diabetes mellitus (HCC)    followed by dr vincente   Joint pain    Kidney transplant recipient 12/17/2021   [facility]   Mixed hyperlipidemia    OSA (obstructive sleep apnea)    per study 10-20-2015 mild osa w/ AHI 10.3/hr;  09-05-2017 per pt has not used cpap since 1 yr ago   Palpitations    Peripheral neuropathy    feet   Pure hypercholesterolemia 05/21/2021   S/P arteriovenous (AV) fistula creation 07-04-2015  w/ revision 02-14-2017    left braniocephilic   Shortness of breath    Trigger thumb, right thumb    Umbilical hernia    Vitamin D deficiency     Past Surgical History:  Procedure Laterality Date   A/V FISTULAGRAM N/A 04/01/2021   Procedure: A/V FISTULAGRAM;  Surgeon: Lanis Fonda BRAVO, MD;  Location: [facility] INVASIVE CV LAB;  Service: Cardiovascular;  Laterality: N/A;   ABDOMINAL HYSTERECTOMY  07/11/2000   dr gott   TAH/BSO for irregular bleeding and leiomyoma   AV FISTULA PLACEMENT Left 07/04/2015   Procedure: ARTERIOVENOUS (AV) FISTULA CREATION-LEFT;  Surgeon: Lynwood JONETTA Collum, MD;  Location: [facility] OR;  Service: Vascular;  Laterality: Left;   BREAST BIOPSY Right    benign   CARPAL TUNNEL RELEASE Bilateral 1993 and 1994   CESAREAN SECTION  1993:  09-25-1999   BILATERAL TUBAL LIGATION W/ LAST C/S   DILATION AND CURETTAGE OF UTERUS     EXPLORATORY LAPARTOMY / LYSIS ADHESIONS/  BILATERAL SALPINGOOPHORECTOMY  07-20-2011  dr s. vonzell  [facility]   FISTULA SUPERFICIALIZATION Left 08/28/2015   Procedure: BRACHIOCEPHALIC FISTULA SUPERFICIALIZATION;  Surgeon: Lynwood JONETTA Collum, MD;  Location: [facility] OR;  Service: Vascular;  Laterality: Left;   KIDNEY TRANSPLANT  12/17/2021   PATCH ANGIOPLASTY Left 02/14/2017   Procedure: [facility]  ANGIOPLASTY;  Surgeon: Harvey Carlin BRAVO, MD;  Location: [facility] OR;  Service: Vascular;  Laterality: Left;   PELVIC LAPAROSCOPY  1994   exploration for ectopic preg.   PERIPHERAL VASCULAR BALLOON ANGIOPLASTY  04/01/2021   Procedure: PERIPHERAL VASCULAR BALLOON ANGIOPLASTY;  Surgeon: Lanis Fonda BRAVO, MD;  Location: [facility] INVASIVE CV LAB;  Service: Cardiovascular;;   RETINAL DETACHMENT SURGERY Left 2015   REVISON OF ARTERIOVENOUS FISTULA Left 02/14/2017   Procedure: REVISON OF ARTERIOVENOUS FISTULA  LEFT ARM;  Surgeon: Harvey Carlin BRAVO, MD;  Location: [facility] OR;  Service: Vascular;  Laterality: Left;   REVISON OF ARTERIOVENOUS FISTULA Left 07/24/2021   Procedure: REVISON OF ARTERIOVENOUS FISTULA ARM;  Surgeon: Eliza Lonni RAMAN, MD;  Location: [facility] OR;  Service: Vascular;  Laterality: Left;   TRIGGER FINGER RELEASE Left 2015   thumb, also done in 2022   TRIGGER FINGER RELEASE Right 09/09/2017   Procedure: RELEASE TRIGGER FINGER/A-1 PULLEY RIGHT THUMB;  Surgeon: Yvone Rush, MD;  Location: [facility];  Service: Orthopedics;  Laterality: Right;    Social History   Socioeconomic History   Marital status: Married    Spouse name: Not on file   Number of children: 2   Years of education: Not on file   Highest education level: Some college, no degree  Occupational History   Occupation: medical billing and insurance  Tobacco Use   Smoking status: Never    Passive exposure: Current   Smokeless tobacco: Never  Vaping Use   Vaping status: Never Used  Substance and Sexual Activity   Alcohol use: Yes    Comment: socially   Drug use: No   Sexual activity: Yes    Birth control/protection: Surgical    Comment: HYST-1st intercourse 53 yo-Fewer than 5 partners  Other Topics Concern   Not on file  Social History Narrative   Not on file   Social Drivers of Health   Financial Resource Strain: Low Risk  (05/19/2023)   Overall Financial Resource Strain (CARDIA)    Difficulty of Paying Living Expenses: Not very hard  Food Insecurity: No Food Insecurity (12/01/2023)   Hunger Vital Sign    Worried About Running Out of Food in the Last Year: Never true    Ran Out of Food in the Last Year: Never true  Transportation Needs: No Transportation Needs (12/01/2023)   PRAPARE - Administrator, Civil Service (Medical): No    Lack of Transportation (Non-Medical): No  Physical Activity: Insufficiently Active (12/01/2023)   Exercise Vital Sign    Days of Exercise per Week: 3 days    Minutes of Exercise per Session: 20 min  Stress: No Stress Concern Present (12/01/2023)   [employer] of Occupational Health - Occupational Stress Questionnaire    Feeling of Stress: Not at all  Social  Connections: Socially Integrated (12/01/2023)   Social Connection and Isolation Panel    Frequency of Communication with Friends and Family: More than three times a week    Frequency of Social Gatherings with Friends and Family: More than three times a week    Attends Religious Services: 1 to 4 times per year    Active Member of [employer] or Organizations: Yes    Attends Banker Meetings: More than 4 times per year    Marital Status: Married  Catering manager Violence: Not At Risk (12/01/2023)   Humiliation, Afraid, Rape, and Kick questionnaire    Fear of Current or Ex-Partner: No    Emotionally  Abused: No    Physically Abused: No    Sexually Abused: No   *** Family History  Problem Relation Age of Onset   Diabetes Mother    Hypertension Mother    Thyroid  disease Mother    Ovarian cancer Mother        dx 54s   Diabetes Father    Hypertension Father    Cancer Father        STOMACH   Stomach cancer Father 87   Breast cancer Maternal Aunt 50   Cancer Paternal Uncle        unknown type, dx >50   Stroke Maternal Grandmother    Stroke Maternal Grandfather    Cancer Paternal Grandfather        unknown type, mets to brain   Cancer Cousin        unknown type, dx 18s, paternal first cousin   Cancer Other        unknown type, father's first cousin   Cancer Other        unknown types, 4 of mother's first cousins    Current Outpatient Medications  Medication Sig Dispense Refill   albuterol  (VENTOLIN  HFA) 108 (90 Base) MCG/ACT inhaler Inhale 2 puffs into the lungs every 6 (six) hours as needed for wheezing or shortness of breath. 8 g 2   allopurinol  (ZYLOPRIM ) 100 MG tablet Take 1 tablet (100 mg total) by mouth daily. 90 tablet 2   aspirin EC 81 MG tablet Take 1 tablet by mouth daily.     Continuous Glucose Sensor (DEXCOM G7 SENSOR) MISC 1 % by Does not apply route continuous. 1 each, Every 14 days     furosemide  (LASIX ) 80 MG tablet Take 80 mg by mouth 2 (two) times daily.      insulin  aspart (NOVOLOG ) 100 UNIT/ML injection Up to 200 units in the insulin  pump daily (Patient taking differently: Inject 40-45 Units into the skin 3 (three) times daily with meals.) 180 mL 2   Insulin  Disposable Pump (OMNIPOD 5 G7 PODS, GEN 5,) MISC 1 each by Does not apply route every other day. 15 each 11   insulin  glargine (LANTUS ) 100 UNIT/ML Solostar Pen Inject 40 Units into the skin daily. (Patient taking differently: Inject 55 Units into the skin daily.) 30 mL 3   Insulin  Lispro-aabc (LYUMJEV  KWIKPEN) 200 UNIT/ML KwikPen Use up to 100 units a day in the insulin  pump. We need the U200 concentration.  DAW1. 45 mL 3   Insulin  Pen Needle 32G X 4 MM MISC Use 2x a day 200 each 3   Insulin  Syringe/Needle U-500 (BD INSULIN  SYRINGE U-500) 31G X 0.5 ML MISC Use to inject insulin  3 times a day 300 each 5   methenamine (HIPREX) 1 g tablet Take 1 g by mouth daily.     Microlet Lancets MISC Use as directed to check blood sugars 4 times per day dx: e11.22 300 each 2   mycophenolate (MYFORTIC) 180 MG EC tablet Take 180 mg by mouth 2 (two) times daily. Patient is taking 2 tablets twice daily     NIFEdipine  (PROCARDIA -XL/NIFEDICAL-XL) 30 MG 24 hr tablet Take 2 tablets by mouth in the morning and one tablet by mouth In the evening. 270 tablet 0   omeprazole  (PRILOSEC) 20 MG capsule Take 1 capsule (20 mg total) by mouth daily. 90 capsule 3   predniSONE  (DELTASONE ) 5 MG tablet Take 5 mg by mouth daily with breakfast.     pregabalin  (LYRICA ) 150 MG  capsule TAKE 1 CAPSULE BY MOUTH EVERY DAY 90 capsule 1   rosuvastatin  (CRESTOR ) 20 MG tablet Take 1 tablet (20 mg total) by mouth daily. 90 tablet 3   Semaglutide , 2 MG/DOSE, (OZEMPIC , 2 MG/DOSE,) 8 MG/3ML SOPN Inject 2 mg into the skin once a week. 9 mL 3   sevelamer carbonate (RENVELA) 800 MG tablet Take 1,600 mg by mouth 3 (three) times daily.     sodium zirconium cyclosilicate (LOKELMA) 5 g packet Take by mouth. (Patient not taking: Reported on 01/12/2024)      sulfamethoxazole -trimethoprim  (BACTRIM ) 400-80 MG tablet Take 1 tablet by mouth 3 (three) times a week. Monday, Wednesday, Friday     tacrolimus (PROGRAF) 1 MG capsule Take 1 mg by mouth 2 (two) times daily.     valACYclovir  (VALTREX ) 1000 MG tablet Take 1 tablet (1,000 mg total) by mouth 2 (two) times daily. (Patient not taking: Reported on 01/12/2024) 14 tablet 0   No current facility-administered medications for this visit.    Allergies  Allergen Reactions   Novolog  [Insulin  Aspart]     Elevated glucose readings      REVIEW OF SYSTEMS:  *** [X]  denotes positive finding, [ ]  denotes negative finding Cardiac  Comments:  Chest pain or chest pressure:    Shortness of breath upon exertion:    Short of breath when lying flat:    Irregular heart rhythm:        Vascular    Pain in calf, thigh, or hip brought on by ambulation:    Pain in feet at night that wakes you up from your sleep:     Blood clot in your veins:    Leg swelling:         Pulmonary    Oxygen at home:    Productive cough:     Wheezing:         Neurologic    Sudden weakness in arms or legs:     Sudden numbness in arms or legs:     Sudden onset of difficulty speaking or slurred speech:    Temporary loss of vision in one eye:     Problems with dizziness:         Gastrointestinal    Blood in stool:     Vomited blood:         Genitourinary    Burning when urinating:     Blood in urine:        Psychiatric    Major depression:         Hematologic    Bleeding problems:    Problems with blood clotting too easily:        Skin    Rashes or ulcers:        Constitutional    Fever or chills:      PHYSICAL EXAMINATION:  There were no vitals filed for this visit.  General:  WDWN in NAD; vital signs documented above Gait: Not observed HENT: WNL, normocephalic Pulmonary: normal non-labored breathing , without wheezing Cardiac: {Desc; regular/irreg:14544} HR Abdomen: soft, NT, no masses Skin:  {With/Without:20273} rashes Vascular Exam/Pulses:  Right Left  Radial {Exam; arterial pulse strength 0-4:30167} {Exam; arterial pulse strength 0-4:30167}  Ulnar {Exam; arterial pulse strength 0-4:30167} {Exam; arterial pulse strength 0-4:30167}  Femoral {Exam; arterial pulse strength 0-4:30167} {Exam; arterial pulse strength 0-4:30167}  Popliteal {Exam; arterial pulse strength 0-4:30167} {Exam; arterial pulse strength 0-4:30167}  DP {Exam; arterial pulse strength 0-4:30167} {Exam; arterial pulse strength 0-4:30167}  PT {Exam; arterial  pulse strength 0-4:30167} {Exam; arterial pulse strength 0-4:30167}   Extremities: {With/Without:20273} ischemic changes, {With/Without:20273} Gangrene , {With/Without:20273} cellulitis; {With/Without:20273} open wounds;  Musculoskeletal: no muscle wasting or atrophy  Neurologic: A&O X 3;  No focal weakness or paresthesias are detected Psychiatric:  The pt has {Desc; normal/abnormal:11317::Normal} affect.   Non-Invasive Vascular Imaging:   ***    ASSESSMENT/PLAN: Cassandra Campos is a 53 y.o. female presenting with ***   ***   Fonda FORBES Rim, MD [facility] [phone]

## 2024-01-25 ENCOUNTER — Other Ambulatory Visit: Payer: Self-pay

## 2024-01-26 ENCOUNTER — Ambulatory Visit: Attending: Vascular Surgery | Admitting: Vascular Surgery

## 2024-01-26 ENCOUNTER — Encounter: Payer: Self-pay | Admitting: Vascular Surgery

## 2024-01-26 ENCOUNTER — Telehealth: Payer: Self-pay | Admitting: Dietician

## 2024-01-26 VITALS — BP 119/74 | HR 74 | Temp 97.8°F | Ht 61.0 in | Wt 229.0 lb

## 2024-01-26 DIAGNOSIS — R200 Anesthesia of skin: Secondary | ICD-10-CM

## 2024-01-26 DIAGNOSIS — T82898A Other specified complication of vascular prosthetic devices, implants and grafts, initial encounter: Secondary | ICD-10-CM

## 2024-01-26 NOTE — Telephone Encounter (Signed)
 Returned patient call. She has been advised to restart her insulin  pump and she request that I program/verify the settings before she starts this.   Appointment made for Monday at 1:30.  Leita Constable, RD, LDN, CDCES, DipACLM

## 2024-01-27 ENCOUNTER — Other Ambulatory Visit: Payer: Self-pay | Admitting: Internal Medicine

## 2024-01-27 ENCOUNTER — Telehealth: Payer: Self-pay

## 2024-01-27 NOTE — Telephone Encounter (Signed)
 Attempted to call for surgery scheduling. LVM

## 2024-01-30 ENCOUNTER — Encounter: Attending: Internal Medicine | Admitting: Dietician

## 2024-01-30 ENCOUNTER — Telehealth: Payer: Self-pay

## 2024-01-30 DIAGNOSIS — N184 Chronic kidney disease, stage 4 (severe): Secondary | ICD-10-CM | POA: Insufficient documentation

## 2024-01-30 DIAGNOSIS — E1122 Type 2 diabetes mellitus with diabetic chronic kidney disease: Secondary | ICD-10-CM | POA: Insufficient documentation

## 2024-01-30 DIAGNOSIS — Z794 Long term (current) use of insulin: Secondary | ICD-10-CM | POA: Insufficient documentation

## 2024-01-30 NOTE — Telephone Encounter (Signed)
 Called pt to schedule her surgery. She is not ready to schedule and will call us  back when she is ready to proceed.

## 2024-01-30 NOTE — Patient Instructions (Signed)
 For a bolus before you eat:  Enter 6 grams in the carb space  Port your sensor glucose number or manually enter your blood glucose number  Give the bolus  Treat any low blood glucose less than 70 with 15 grams of fast acting carbs such as 1/2 cup OJ or regular soda or 4 glucose tabs.   Repeat every 15 minutes until blood glucose is more than 70. Follow with meal or snack with protein.

## 2024-01-30 NOTE — Progress Notes (Signed)
 Patient here today to change her pump settings as she will be resuming Omnipod 5 pump use. Entered the following settings into her pump from MD note 01/12/2024.  When ready, change back to the Omnipod pump with U200 Lyumjev  in the pump: - Basal rates: 12 am: 0.95 units/h - Insulin  to carb ratio: 12 am:  1:2 - Target: 12 am: 110-110 - Correction factor (insulin  sensitivity factor):  12 am: 50 - Active insulin  time: 4h   Start all the boluses before meals.  Discussed with Donell Redgie Butts, MD. When patient enters a bolus she should enter 6 grams carbs in the carbohydrate section. Instructed patient how to bolus.  Patient verbalized how to give a bolus and to enter 6 grams carbs before each meal. She states that she will start a pod tonight.  Leita Constable, RD, LDN, CDCES, DipACLM

## 2024-01-31 ENCOUNTER — Telehealth: Payer: Self-pay | Admitting: Dietician

## 2024-01-31 ENCOUNTER — Encounter: Admitting: Internal Medicine

## 2024-01-31 NOTE — Telephone Encounter (Signed)
 Patient started back on the pump 01/30/2024 after having her omnipod 5 PDM reprogrammed.  She is using Lyumjev  U-200 in the pump.  She is entering 6 grams in the carb section as well as porting sensor reading to bolus before meals.  She is now having hyperglycemia.  Discussed that it can take 2-3 pod changes to improve control.  Will also ask MD if she wishes any of her pump settings to be changed.    Leita Constable, RD, LDN, CDCES, DipACLM

## 2024-01-31 NOTE — Patient Instructions (Incomplete)

## 2024-01-31 NOTE — Progress Notes (Deleted)
 I,Taha Dimond T Emmitt, CMA,acting as a Neurosurgeon for [name], MD.,have documented all relevant documentation on the behalf of [name], MD,as directed by  [name], MD while in the presence of [name], MD.  Subjective:    Patient ID: Cassandra Campos , female    DOB: Oct 14, 1970 , 53 y.o.   MRN: [id number]  No chief complaint on file.   HPI  Patient presents today for annual exam. She reports compliance with medications. Denies headache, chest pain, and SOB. GYN: [name]. She was last seen Summer 2023. Patient reports upcoming appointment in August.    Diabetes She presents for her follow-up diabetic visit. She has type 2 diabetes mellitus. Her disease course has been stable. There are no hypoglycemic associated symptoms. Pertinent negatives for diabetes include no blurred vision and no chest pain. There are no hypoglycemic complications. Diabetic complications include nephropathy, peripheral neuropathy and retinopathy. Risk factors for coronary artery disease include dyslipidemia, hypertension, diabetes mellitus, post-menopausal, sedentary lifestyle and obesity. She is compliant with treatment some of the time. She is following a diabetic diet. She has not had a previous visit with a dietitian. She participates in exercise intermittently. Her home blood glucose trend is fluctuating minimally. Her breakfast blood glucose is taken between 9-10 am. Her breakfast blood glucose range is generally 140-180 mg/dl. Eye exam is current.  Hypertension This is a chronic problem. The current episode started more than 1 year ago. The problem has been gradually improving since onset. The problem is controlled. Pertinent negatives include no blurred vision, chest pain or shortness of breath. Palpitations: she c/o palpitations. recurrent. no associated chest pain. random. occurs at rest. .The current treatment provides moderate improvement. Hypertensive end-organ damage  includes retinopathy. Identifiable causes of hypertension include renovascular disease.     Past Medical History:  Diagnosis Date  . Anemia associated with chronic renal failure   . ASCUS of cervix with negative high risk HPV 06/2017  . Back pain   . Chest pain   . CHF (congestive heart failure) (HCC)   . CKD (chronic kidney disease), stage IV (HCC) no dialysis yet   nephrologist-  dr montie dakins-- scheduled next visit 09-08-2017 (lov 07-05-2017)  . Constipation   . Diabetic retinopathy (HCC)   . Dialysis patient (HCC)   . Edema, lower extremity   . Elevated transaminase level   . Family history of breast cancer   . Family history of ovarian cancer   . Family history of stomach cancer   . Fatty liver    FOLLOWED BY DR ABRAN  . GERD (gastroesophageal reflux disease)   . Hypertension   . Idiopathic gout of multiple sites    09-05-2017 per pt stable  . Insulin  dependent type 2 diabetes mellitus (HCC)    followed by dr vincente  . Joint pain   . Kidney transplant recipient 12/17/2021   [facility]  . Mixed hyperlipidemia   . OSA (obstructive sleep apnea)    per study 10-20-2015 mild osa w/ AHI 10.3/hr;  09-05-2017 per pt has not used cpap since 1 yr ago  . Palpitations   . Peripheral neuropathy    feet  . Pure hypercholesterolemia 05/21/2021  . S/P arteriovenous (AV) fistula creation 07-04-2015  w/ revision 02-14-2017   left braniocephilic  . Shortness of breath   . Trigger thumb, right thumb   . Umbilical hernia   . Vitamin D deficiency      Family History  Problem  Relation Age of Onset  . Diabetes Mother   . Hypertension Mother   . Thyroid  disease Mother   . Ovarian cancer Mother        dx 56s  . Diabetes Father   . Hypertension Father   . Cancer Father        STOMACH  . Stomach cancer Father 93  . Breast cancer Maternal Aunt 65  . Cancer Paternal Uncle        unknown type, dx >50  . Stroke Maternal Grandmother   . Stroke Maternal Grandfather   .  Cancer Paternal Grandfather        unknown type, mets to brain  . Cancer Cousin        unknown type, dx 20s, paternal first cousin  . Cancer Other        unknown type, father's first cousin  . Cancer Other        unknown types, 4 of mother's first cousins     Current Outpatient Medications:  .  albuterol  (VENTOLIN  HFA) 108 (90 Base) MCG/ACT inhaler, Inhale 2 puffs into the lungs every 6 (six) hours as needed for wheezing or shortness of breath., Disp: 8 g, Rfl: 2 .  allopurinol  (ZYLOPRIM ) 100 MG tablet, Take 1 tablet (100 mg total) by mouth daily., Disp: 90 tablet, Rfl: 2 .  aspirin EC 81 MG tablet, Take 1 tablet by mouth daily., Disp: , Rfl:  .  Continuous Glucose Sensor (DEXCOM G7 SENSOR) MISC, 1 % by Does not apply route continuous. 1 each, Every 14 days, Disp: , Rfl:  .  furosemide  (LASIX ) 80 MG tablet, Take 80 mg by mouth 2 (two) times daily., Disp: , Rfl:  .  insulin  aspart (NOVOLOG ) 100 UNIT/ML injection, Up to 200 units in the insulin  pump daily (Patient taking differently: Inject 40-45 Units into the skin 3 (three) times daily with meals.), Disp: 180 mL, Rfl: 2 .  Insulin  Disposable Pump (OMNIPOD 5 G7 PODS, GEN 5,) MISC, 1 each by Does not apply route every other day., Disp: 15 each, Rfl: 11 .  Insulin  Glargine (BASAGLAR  KWIKPEN) 100 UNIT/ML, Inject 35-40 Units into the skin 2 (two) times daily., Disp: 60 mL, Rfl: 4 .  Insulin  Lispro-aabc (LYUMJEV  KWIKPEN) 200 UNIT/ML KwikPen, Use up to 100 units a day in the insulin  pump. We need the U200 concentration.  DAW1., Disp: 45 mL, Rfl: 3 .  Insulin  Pen Needle 32G X 4 MM MISC, Use 2x a day, Disp: 200 each, Rfl: 3 .  Insulin  Syringe/Needle U-500 (BD INSULIN  SYRINGE U-500) 31G X 0.5 ML MISC, Use to inject insulin  3 times a day, Disp: 300 each, Rfl: 5 .  methenamine (HIPREX) 1 g tablet, Take 1 g by mouth daily., Disp: , Rfl:  .  Microlet Lancets MISC, Use as directed to check blood sugars 4 times per day dx: e11.22, Disp: 300 each, Rfl:  2 .  mycophenolate (MYFORTIC) 180 MG EC tablet, Take 180 mg by mouth 2 (two) times daily. Patient is taking 2 tablets twice daily, Disp: , Rfl:  .  NIFEdipine  (PROCARDIA -XL/NIFEDICAL-XL) 30 MG 24 hr tablet, Take 2 tablets by mouth in the morning and one tablet by mouth In the evening., Disp: 270 tablet, Rfl: 0 .  omeprazole  (PRILOSEC) 20 MG capsule, Take 1 capsule (20 mg total) by mouth daily., Disp: 90 capsule, Rfl: 3 .  predniSONE  (DELTASONE ) 5 MG tablet, Take 5 mg by mouth daily with breakfast., Disp: , Rfl:  .  pregabalin  (  LYRICA ) 150 MG capsule, TAKE 1 CAPSULE BY MOUTH EVERY DAY, Disp: 90 capsule, Rfl: 1 .  rosuvastatin  (CRESTOR ) 20 MG tablet, Take 1 tablet (20 mg total) by mouth daily., Disp: 90 tablet, Rfl: 3 .  Semaglutide , 2 MG/DOSE, (OZEMPIC , 2 MG/DOSE,) 8 MG/3ML SOPN, Inject 2 mg into the skin once a week., Disp: 9 mL, Rfl: 3 .  sevelamer carbonate (RENVELA) 800 MG tablet, Take 1,600 mg by mouth 3 (three) times daily., Disp: , Rfl:  .  sodium zirconium cyclosilicate (LOKELMA) 5 g packet, Take by mouth., Disp: , Rfl:  .  sulfamethoxazole -trimethoprim  (BACTRIM ) 400-80 MG tablet, Take 1 tablet by mouth 3 (three) times a week. Monday, Wednesday, Friday, Disp: , Rfl:  .  tacrolimus (PROGRAF) 1 MG capsule, Take 1 mg by mouth 2 (two) times daily., Disp: , Rfl:  .  valACYclovir  (VALTREX ) 1000 MG tablet, Take 1 tablet (1,000 mg total) by mouth 2 (two) times daily., Disp: 14 tablet, Rfl: 0   Allergies  Allergen Reactions  . Novolog  [Insulin  Aspart]     Elevated glucose readings       The patient states she uses {contraceptive methods:5051} for birth control. No LMP recorded. Patient has had a hysterectomy.. {Dysmenorrhea-menorrhagia:21918}. Negative for: breast discharge, breast lump(s), breast pain and breast self exam. Associated symptoms include abnormal vaginal bleeding. Pertinent negatives include abnormal bleeding (hematology), anxiety, decreased libido, depression, difficulty falling  sleep, dyspareunia, history of infertility, nocturia, sexual dysfunction, sleep disturbances, urinary incontinence, urinary urgency, vaginal discharge and vaginal itching. Diet regular.The patient states her exercise level is    . The patient's tobacco use is:  Social History   Tobacco Use  Smoking Status Never  . Passive exposure: Current  Smokeless Tobacco Never  . She has been exposed to passive smoke. The patient's alcohol use is:  Social History   Substance and Sexual Activity  Alcohol Use Yes   Comment: socially  . Additional information: Last pap ***, next one scheduled for ***.    Review of Systems  Constitutional: Negative.   HENT: Negative.    Eyes: Negative.  Negative for blurred vision.  Respiratory: Negative.  Negative for shortness of breath.   Cardiovascular: Negative.  Negative for chest pain. Palpitations: she c/o palpitations. recurrent. no associated chest pain. random. occurs at rest. . Gastrointestinal: Negative.   Endocrine: Negative.   Genitourinary: Negative.   Musculoskeletal: Negative.   Skin: Negative.   Allergic/Immunologic: Negative.   Neurological: Negative.   Hematological: Negative.   Psychiatric/Behavioral: Negative.       There were no vitals filed for this visit. There is no height or weight on file to calculate BMI.  Wt Readings from Last 3 Encounters:  01/26/24 229 lb (103.9 kg)  01/12/24 221 lb (100.2 kg)  09/05/23 227 lb 9.6 oz (103.2 kg)     Objective:  Physical Exam      Assessment And Plan:     Encounter for annual health examination  Type 2 diabetes mellitus with stage 4 chronic kidney disease, with long-term current use of insulin  (HCC)  Renovascular hypertension  Kidney transplant recipient     No follow-ups on file. Patient was given opportunity to ask questions. Patient verbalized understanding of the plan and was able to repeat key elements of the plan. All questions were answered to their satisfaction.    [name], MD  I, [name], MD, have reviewed all documentation for this visit. The documentation on 01/31/24 for the exam, diagnosis, procedures, and orders  are all accurate and complete.

## 2024-02-02 NOTE — Telephone Encounter (Signed)
 Cassandra Campos, She was injecting Lyumjev  40-45 units before meals before she restarted the pump.  Just entering 6 g of carbs per meal (she has a 1: 2 insulin  to carb ratio) will not be enough.  Lets increase the amount of carbs that she is entering to 10-20 g and may need more, depending on how the sugars change. Ty! C

## 2024-02-06 NOTE — Patient Outreach (Signed)
 Complex Care Management   Visit Note  01/25/2024  Name:  Cassandra Campos MRN: [id number] DOB: 29-Oct-1970  Situation: Referral received for Complex Care Management related to Diabetes with Complications, Chronic Kidney Disease, and Palpitations, Kidney transplant recipient, Obesity. I obtained verbal consent from Patient.  Visit completed with Patient on the phone.  Background:   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   nephrologist-  dr montie dakins-- scheduled next visit 09-08-2017 (lov 07-05-2017)   Constipation    Diabetic retinopathy (HCC)    Dialysis patient (HCC)    Edema, lower extremity    Elevated transaminase level    Family history of breast cancer    Family history of ovarian cancer    Family history of stomach cancer    Fatty liver    FOLLOWED BY DR ABRAN   GERD (gastroesophageal reflux disease)    Hypertension    Idiopathic gout of multiple sites    09-05-2017 per pt stable   Insulin  dependent type 2 diabetes mellitus (HCC)    followed by dr vincente   Joint pain    Kidney transplant recipient 12/17/2021   [facility]   Mixed hyperlipidemia    OSA (obstructive sleep apnea)    per study 10-20-2015 mild osa w/ AHI 10.3/hr;  09-05-2017 per pt has not used cpap since 1 yr ago   Palpitations    Peripheral neuropathy    feet   Pure hypercholesterolemia 05/21/2021   S/P arteriovenous (AV) fistula creation 07-04-2015  w/ revision 02-14-2017   left braniocephilic   Shortness of breath    Trigger thumb, right thumb    Umbilical hernia    Vitamin D deficiency     Assessment: Patient Reported Symptoms:  Cognitive Cognitive Status: Alert and oriented to person, place, and time, Normal speech and language  skills Cognitive/Intellectual Conditions Management [RPT]: None reported or documented in medical history or problem list   Health Maintenance Behaviors: Annual physical exam, Healthy diet, Immunizations Health Facilitated by: Healthy diet, Rest  Neurological Neurological Review of Symptoms: No symptoms reported    HEENT HEENT Symptoms Reported: No symptoms reported      Cardiovascular Cardiovascular Symptoms Reported: No symptoms reported    Respiratory Respiratory Symptoms Reported: No symptoms reported    Endocrine Endocrine Symptoms Reported: No symptoms reported Is patient diabetic?: Yes Is patient checking blood sugars at home?: Yes List most recent blood sugar readings, include date and time of day: FBS today 235 patient ran out of insulin  Endocrine Self-Management Outcome: 4 (good)  Gastrointestinal Gastrointestinal Symptoms Reported: No symptoms reported      Genitourinary Genitourinary Symptoms Reported: No symptoms reported    Integumentary Integumentary Symptoms Reported: No symptoms reported    Musculoskeletal Musculoskelatal Symptoms Reviewed: No symptoms reported        Psychosocial Psychosocial Symptoms Reported: No symptoms reported     Quality of Family Relationships: supportive Do you feel physically threatened by others?: No    02/06/2024    PHQ2-9 Depression Screening   Cassandra Campos interest or pleasure in doing things    Feeling down, depressed, or hopeless    PHQ-2 - Total Score    Trouble falling or staying asleep, or sleeping too much  Feeling tired or having Cassandra Campos energy    Poor appetite or overeating     Feeling bad about yourself - or that you are a failure or have let yourself or your family down    Trouble concentrating on things, such as reading the newspaper or watching television    Moving or speaking so slowly that other people could have noticed.  Or the opposite - being so fidgety or restless that you have been moving around a lot more  than usual    Thoughts that you would be better off dead, or hurting yourself in some way    PHQ2-9 Total Score    If you checked off any problems, how difficult have these problems made it for you to do your work, take care of things at home, or get along with other people    Depression Interventions/Treatment      There were no vitals filed for this visit.  Medications Reviewed Today     Reviewed by Cassandra Clayborne CROME, RN (Registered Nurse) on 01/25/24 at [phone]  Med List Status: <None>   Medication Order Taking? Sig Documenting Provider Last Dose Status Informant  albuterol  (VENTOLIN  HFA) 108 (90 Base) MCG/ACT inhaler [id number]  Inhale 2 puffs into the lungs every 6 (six) hours as needed for wheezing or shortness of breath. Georgina Speaks, FNP  Active Self, Pharmacy Records  allopurinol  (ZYLOPRIM ) 100 MG tablet [id number]  Take 1 tablet (100 mg total) by mouth daily. Petrina Pries, NP  Active   aspirin EC 81 MG tablet [id number]  Take 1 tablet by mouth daily. [provider]  Active   Continuous Glucose Sensor (DEXCOM G7 SENSOR) MISC [id number]  1 % by Does not apply route continuous. 1 each, Every 14 days [provider]  Active   furosemide  (LASIX ) 80 MG tablet [id number]  Take 80 mg by mouth 2 (two) times daily. [provider]  Active   insulin  aspart (NOVOLOG ) 100 UNIT/ML injection [id number]  Up to 200 units in the insulin  pump daily  Patient taking differently: Inject 40-45 Units into the skin 3 (three) times daily with meals.   Trixie File, MD  Active   Insulin  Disposable Pump (OMNIPOD 5 G7 PODS, GEN 5,) MISC [id number]  1 each by Does not apply route every other day. Trixie File, MD  Active   insulin  glargine (LANTUS ) 100 UNIT/ML Solostar Pen [id number]  Inject 40 Units into the skin daily.  Patient taking differently: Inject 55 Units into the skin daily.   Trixie File, MD  Active   Insulin  Lispro-aabc (LYUMJEV  [facility]) 200 UNIT/ML KwikPen  [id number]  Use up to 100 units a day in the insulin  pump. We need the U200 concentration.  DAW1. Trixie File, MD  Active   Insulin  Pen Needle 32G X 4 MM MISC [id number]  Use 2x a day Trixie File, MD  Active Self, Pharmacy Records  Insulin  Syringe/Needle U-500 (BD INSULIN  SYRINGE U-500) 31G X 0.5 ML MISC [id number]  Use to inject insulin  3 times a day Trixie File, MD  Active   methenamine (HIPREX) 1 g tablet [id number]  Take 1 g by mouth daily. [provider]  Active   Microlet Lancets MISC [id number]  Use as directed to check blood sugars 4 times per day dx: e11.22 [name], MD  Active Self, Pharmacy Records  mycophenolate (MYFORTIC) 180 MG EC tablet [id number]  Take 180 mg by mouth 2 (two) times daily. Patient is taking 2 tablets twice daily [provider]  Active   NIFEdipine  (PROCARDIA -XL/NIFEDICAL-XL) 30 MG 24 hr tablet [id number]  Take 2 tablets by mouth in the morning and one tablet by mouth In the evening. [name], MD  Active   omeprazole  [facility]) 20 MG capsule [id number]  Take 1 capsule (20 mg total) by mouth daily. Petrina Pries, NP  Active   predniSONE  (DELTASONE ) 5 MG tablet [id number]  Take 5 mg by mouth daily with breakfast. [provider]  Active   pregabalin  (LYRICA ) 150 MG capsule [id number]  TAKE 1 CAPSULE BY MOUTH EVERY DAY [name], MD  Active   rosuvastatin  (CRESTOR ) 20 MG tablet [id number]  Take 1 tablet (20 mg total) by mouth daily. Petrina Pries, NP  Active   Semaglutide , 2 MG/DOSE, (OZEMPIC , 2 MG/DOSE,) 8 MG/3ML SOPN [id number]  Inject 2 mg into the skin once a week. Trixie File, MD  Active   sevelamer carbonate (RENVELA) 800 MG tablet [id number]  Take 1,600 mg by mouth 3 (three) times daily. [provider]  Active   sodium zirconium cyclosilicate (LOKELMA) 5 g packet [id number]  Take by mouth.  Patient not taking: Reported on 01/12/2024   [provider]  Active   sulfamethoxazole -trimethoprim   (BACTRIM ) 400-80 MG tablet [id number]  Take 1 tablet by mouth 3 (three) times a week. Monday, Wednesday, Friday [provider]  Active   tacrolimus (PROGRAF) 1 MG capsule [id number]  Take 1 mg by mouth 2 (two) times daily. [provider]  Active   valACYclovir  (VALTREX ) 1000 MG tablet [id number]  Take 1 tablet (1,000 mg total) by mouth 2 (two) times daily.  Patient not taking: Reported on 01/12/2024   [name], MD  Active   Med List Note Anthoney Medici, MD 11/09/18 0911): Dialysis MWF            Recommendation:   Specialty provider follow-up for Echocardiogram on 02/22/24 at 3:15 PM Specialty provider follow-up with Dr. Trixie for a diabetes check on 02/23/24 at 11:20 AM  Follow Up Plan:   Telephone follow up appointment date/time:  Monday, October 13 at 09:30 AM  Clayborne Ly RN BSN CCM [place]  [facility], [facility] Health Nurse Care Coordinator  Direct Dial: [phone] Website: [email or web address]

## 2024-02-06 NOTE — Patient Instructions (Signed)
 Visit Information  Thank you for taking time to visit with me today. Please don't hesitate to contact me if I can be of assistance to you before our next scheduled appointment.  Your next care management appointment is by telephone on Monday, October 13 at 09:30 AM  Please call the care guide team at [phone] if you need to cancel, schedule, or reschedule an appointment.   Please call [phone]-TALK (toll free, 24 hour hotline) if you are experiencing a Mental Health or Behavioral Health Crisis or need someone to talk to.  Clayborne Ly RN BSN CCM [place]  [facility], [facility] Health Nurse Care Coordinator  Direct Dial: [phone] Website: [email or web address]

## 2024-02-07 ENCOUNTER — Other Ambulatory Visit: Payer: Self-pay

## 2024-02-07 ENCOUNTER — Other Ambulatory Visit (HOSPITAL_COMMUNITY): Payer: Self-pay

## 2024-02-08 ENCOUNTER — Telehealth: Payer: Self-pay | Admitting: Pharmacist

## 2024-02-08 DIAGNOSIS — E1122 Type 2 diabetes mellitus with diabetic chronic kidney disease: Secondary | ICD-10-CM

## 2024-02-08 NOTE — Progress Notes (Signed)
   02/08/2024  Patient ID: Cassandra Campos, female   DOB: 07-Dec-1970, 53 y.o.   MRN: [id number]  Called Patient to follow up on diabetes management. Unfortunately, she did not answer her phone. HIPAA compliant message was left on her voicemail.  Reviewed Blood sugars in Dexcom Clarity:     Last HgA1c- 8.2% Ozempic  2 mg weekly, Omnipod insulin  pump-Basaglar  & Lyumjev  Rosuvastatin   20 mg last filled 01/22/24-30 day supply Not on ACE/ARB   Follow up after Endocrinology appointment 02/22/24  Cassius DOROTHA Brought, PharmD, BCACP Clinical Pharmacist [phone]    Cassius DOROTHA Brought, PharmD, BCACP Clinical Pharmacist [phone]

## 2024-02-09 ENCOUNTER — Encounter (HOSPITAL_COMMUNITY): Payer: Self-pay

## 2024-02-09 ENCOUNTER — Other Ambulatory Visit (HOSPITAL_COMMUNITY): Payer: Self-pay

## 2024-02-09 MED ORDER — LYUMJEV KWIKPEN 200 UNIT/ML ~~LOC~~ SOPN
100.0000 [IU] | PEN_INJECTOR | Freq: Every day | SUBCUTANEOUS | 3 refills | Status: DC
  Filled 2024-02-09: qty 48, 96d supply, fill #0
  Filled 2024-05-04: qty 48, 96d supply, fill #1

## 2024-02-10 ENCOUNTER — Other Ambulatory Visit (HOSPITAL_COMMUNITY): Payer: Self-pay

## 2024-02-10 ENCOUNTER — Telehealth: Payer: Self-pay

## 2024-02-10 NOTE — Telephone Encounter (Signed)
 Multiple calls made to patient re: surgery for L arm AVF revision with Dr. Lanis.  Patient has requested to call when ready to schedule.  Letter mailed to patient's home address.

## 2024-02-18 ENCOUNTER — Other Ambulatory Visit: Payer: Self-pay | Admitting: Family Medicine

## 2024-02-18 DIAGNOSIS — Z760 Encounter for issue of repeat prescription: Secondary | ICD-10-CM

## 2024-02-22 ENCOUNTER — Telehealth

## 2024-02-22 ENCOUNTER — Ambulatory Visit (HOSPITAL_COMMUNITY)
Admission: RE | Admit: 2024-02-22 | Discharge: 2024-02-22 | Disposition: A | Discharge status: Home / Self Care | Source: Ambulatory Visit | Attending: Cardiology | Admitting: Cardiology

## 2024-02-22 DIAGNOSIS — R0609 Other forms of dyspnea: Secondary | ICD-10-CM | POA: Diagnosis present

## 2024-02-22 NOTE — Progress Notes (Unsigned)
 Cassandra Campos [facility] 23-Oct-1970 [id number]   History:  53 y.o. [id number] presents for breast and pelvic exam.  Postmenopausal - Stopped ERT a couple of years ago when having transplant surgery. S/P 2002 TAH for fibroids with subsequent BSO in 2013. See pap history below. T2DM managed by endo. 12/2021 kidney transplant surgery. Having frequent UTIs, taking daily methenamine. Maternal aunt and multiple cousins with breast cancer, mother with h/o ovarian cancer, other cancers in family as well. Patient has had negative genetic testing.   2019 ASCUS neg HR HPV 11/2021 LGSIL 02/2022 ASCUS + HR HPV, 04/2022 colpo CIN-1.   Gynecologic History No LMP recorded. Patient has had a hysterectomy.   Contraception/Family planning: status post hysterectomy Sexually active: Yes  Health Maintenance Last Pap: 02/22/2023. Results were: ASCUS + HR HPV Last mammogram: 11/15/2023. Results were: Normal  Last colonoscopy: 03/24/2017. Results were: Normal, 10-year recall Last Dexa: years ago per patient, prior to menopause     02/23/2024    7:46 AM  Depression screen PHQ 2/9  Decreased Interest 0  Down, Depressed, Hopeless 0  PHQ - 2 Score 0     Past medical history, past surgical history, family history and social history were all reviewed and documented in the EPIC chart. Married. Works for [employer]. Son age 98 and daughter age 59, both local.   ROS:  A ROS was performed and pertinent positives and negatives are included.  Exam:  Vitals:   02/23/24 0740  BP: 120/64  Pulse: 87  SpO2: 98%  Weight: 221 lb (100.2 kg)  Height: 5' 1.75 (1.568 m)      Body mass index is 40.75 kg/m.  General appearance:  Normal Thyroid :  Symmetrical, normal in size, without palpable masses or nodularity. Respiratory  Auscultation:  Clear without wheezing or rhonchi Cardiovascular  Auscultation:  Regular rate, without rubs, murmurs or gallops  Edema/varicosities:  Not grossly  evident Abdominal  Soft,nontender, without masses, guarding or rebound.  Liver/spleen:  No organomegaly noted  Hernia:  None appreciated  Skin  Inspection:  Grossly normal Breasts: Examined lying and sitting.   Right: Without masses, retractions, nipple discharge or axillary adenopathy.   Left: Without masses, retractions, nipple discharge or axillary adenopathy. Pelvic: External genitalia:  no lesions              Urethra:  normal appearing urethra with no masses, tenderness or lesions              Bartholins and Skenes: normal                 Vagina: normal appearing vagina with normal color and discharge, no lesions              Cervix: absent Bimanual Exam:  Uterus: absent              Adnexa: no mass, fullness, tenderness              Rectovaginal: Deferred              Anus:  normal, no lesions  [name], CMA present as chaperone.   Assessment/Plan:  53 y.o. [id number] for breast and pelvic exam.   Well female exam with routine gynecological exam - Education provided on SBEs, importance of preventative screenings, current guidelines, high calcium  diet, regular exercise, and multivitamin daily. Labs with PCP.   Mild vaginal dysplasia - Plan: Cytology - PAP( [facility]). See HPI for pap history. Pap today per guidelines.  Postmenopausal - Stopped ERT for transplant surgery last year. Doing OK without it. S/P TAH BSO.   Screening for breast cancer - Normal mammogram history.  Continue annual screenings.  Normal breast exam today.   Screening for colon cancer - 2018 colonoscopy. Will repeat at 10-year interval per GI's recommendation.  Return in about 1 year (around 02/22/2025) for Annual.     Cassandra DELENA Shutter DNP, 8:07 AM 02/23/2024

## 2024-02-23 ENCOUNTER — Ambulatory Visit: Admitting: Nurse Practitioner

## 2024-02-23 ENCOUNTER — Encounter: Payer: Self-pay | Admitting: Internal Medicine

## 2024-02-23 ENCOUNTER — Ambulatory Visit: Payer: Self-pay | Admitting: Internal Medicine

## 2024-02-23 ENCOUNTER — Ambulatory Visit (INDEPENDENT_AMBULATORY_CARE_PROVIDER_SITE_OTHER): Admitting: Internal Medicine

## 2024-02-23 ENCOUNTER — Other Ambulatory Visit (HOSPITAL_COMMUNITY)
Admission: RE | Admit: 2024-02-23 | Discharge: 2024-02-23 | Disposition: A | Discharge status: Home / Self Care | Source: Ambulatory Visit | Attending: Nurse Practitioner | Admitting: Nurse Practitioner

## 2024-02-23 ENCOUNTER — Encounter: Payer: Self-pay | Admitting: Nurse Practitioner

## 2024-02-23 VITALS — BP 124/80 | HR 84 | Ht 61.75 in | Wt 222.2 lb

## 2024-02-23 VITALS — BP 120/64 | HR 87 | Ht 61.75 in | Wt 221.0 lb

## 2024-02-23 DIAGNOSIS — Z794 Long term (current) use of insulin: Secondary | ICD-10-CM

## 2024-02-23 DIAGNOSIS — N870 Mild cervical dysplasia: Secondary | ICD-10-CM

## 2024-02-23 DIAGNOSIS — E66812 Obesity, class 2: Secondary | ICD-10-CM | POA: Diagnosis not present

## 2024-02-23 DIAGNOSIS — N890 Mild vaginal dysplasia: Secondary | ICD-10-CM | POA: Diagnosis not present

## 2024-02-23 DIAGNOSIS — Z992 Dependence on renal dialysis: Secondary | ICD-10-CM | POA: Diagnosis not present

## 2024-02-23 DIAGNOSIS — Z780 Asymptomatic menopausal state: Secondary | ICD-10-CM | POA: Diagnosis not present

## 2024-02-23 DIAGNOSIS — N186 End stage renal disease: Secondary | ICD-10-CM | POA: Diagnosis not present

## 2024-02-23 DIAGNOSIS — Z1331 Encounter for screening for depression: Secondary | ICD-10-CM

## 2024-02-23 DIAGNOSIS — E1169 Type 2 diabetes mellitus with other specified complication: Secondary | ICD-10-CM

## 2024-02-23 DIAGNOSIS — Z01419 Encounter for gynecological examination (general) (routine) without abnormal findings: Secondary | ICD-10-CM

## 2024-02-23 DIAGNOSIS — E1122 Type 2 diabetes mellitus with diabetic chronic kidney disease: Secondary | ICD-10-CM | POA: Diagnosis not present

## 2024-02-23 DIAGNOSIS — Z6838 Body mass index (BMI) 38.0-38.9, adult: Secondary | ICD-10-CM

## 2024-02-23 DIAGNOSIS — Z124 Encounter for screening for malignant neoplasm of cervix: Secondary | ICD-10-CM

## 2024-02-23 DIAGNOSIS — E785 Hyperlipidemia, unspecified: Secondary | ICD-10-CM

## 2024-02-23 LAB — ECHOCARDIOGRAM COMPLETE
AR max vel: 1.66 cm2
AV Area VTI: 1.66 cm2
AV Area mean vel: 1.63 cm2
AV Mean grad: 10.4 mmHg
AV Peak grad: 17.5 mmHg
Ao pk vel: 2.09 m/s
Area-P 1/2: 4.33 cm2
S' Lateral: 3.4 cm

## 2024-02-23 NOTE — Progress Notes (Signed)
 Patient ID: Cassandra Campos, female   DOB: May 15, 1971, 53 y.o.   MRN: [id number]   HPI: Cassandra Campos is a 53 y.o.-year-old female, returning for follow-up for DM2, dx in 1997, insulin -dependent right after dx, uncontrolled, with complications (ESRD-on HD since 12/2017 >> s/p kidney transplant 12/2021, DR, PN).  Last visit 4 months ago.  Interim hx: No increased urination, blurry vision, chest pain.  She was able to start the OmniPod insulin  pump before last visit but she stopped it in the months prior to the visit as she was running out of insulin  sooner and the pump was giving her many alarms to change it while at work. She was able to restart the pump after our last visit.  Reviewed HbA1c levels: Lab Results  Component Value Date   HGBA1C 8.2 (A) 01/12/2024   HGBA1C 8.0 (H) 05/26/2023   HGBA1C 6.7 (A) 03/15/2023   HGBA1C 7.1 (H) 11/17/2022   HGBA1C 7.5 (A) 09/09/2021   HGBA1C 6.3 (H) 02/02/2021   HGBA1C 8.5 (A) 07/30/2020   HGBA1C 7.9 (H) 01/08/2020   HGBA1C 8.7 (A) 09/11/2019   HGBA1C 10.9 (H) 04/19/2019  08/25/2023: HbA1c 9.2% 02/24/2021: HbA1c 6.8% 11/18/2020: HbA1c 8.9% 05/26/2020: HbA1c 9.8%  Previously on: - Ozempic  1 mg weekly -restarted 11/2020 >> 2 mg weekly >> stopped 08/2021 2/2 high lipase - U500 insulin  (started 2020): - 180-195 >> 200 >> 130-150 >> 140-200 units before breakfast  - 140-150 units before dinner if dinner is early, and 110-120 units if dinner is after dialysis >> 160-170 >> 140-160 >> 110-120 >> 110-140 units before dinner  Then on: - Mounjaro  5 mg weekly by PCP - started 07/2023.  Per my discussion with PCP, it appears that the previous high lipase could have been related to her end-stage renal disease. - Basaglar  24 units in a.m. and 14 units in p.m. >> 40-50 units 2x a day - Novolog  >> FiAsp  10 units 3x a day before meals, + SSI >> Lyumjev  35-45 units before meals 2-3x a day BG < 150: give no additional insulin ;  BG 151-200:  add 2 units;  BG 201-250: add 4 units;  BG 251-300: add 6 units;  BG 301-350: add 8 units;  BG 351-400: add 10 units   Prev. On:: - Mounjaro  10 mg weekly >> Ozempic  2 mg weekly - Basaglar  50-55 units daily - Lyumjev  40-45 units before meals  Now on: - Ozempic  2 mg weekly - Insulin  pump -OmniPod 5 G7: - changed to U200 Lyumjev  - Basal rates: 12 am: 1.85 >> 0.95 units/h - Insulin  to carb ratio: 12 am:  1:1 >> 1:2 - Target: 12 am: 110-110 - Correction factor (insulin  sensitivity factor):  12 am: 25 >> 50 - Active insulin  time: 4h - Changes infusion site: q3 days  Total daily dose from basal insulin : 54% (43.2 units) >> 59% (24 units) Total daily dose from bolus insulin : 46% (37.1 units) >> 41%  TDD 43-60 units   CGM: - Libre 2 plus >> now Dexcom G7  Pt checks her sugars more than 4 times a day with her CGM:  Previously:   Previously:  Lowest sugar was 52 >> 98 >> 69; she has hypoglycemia awareness in the 60s. Highest sugar was 455 >>...321 >> 300s (pie) >> 400.  Glucometer: Contour next  Pt's meals are: - Breakfast: yoghurt and blueberries; egg McMuffin - Lunch: fish, tacos, subs, cake - Dinner: salad, steak, shrimp - occasionally after HD at 10 pm, but more recently she  has this during dialysis, around 52 PM - Snacks: fruit, peanuts  -+ H/o End-stage renal disease, now s/p kidney transplant-seeing nephrology, last BUN/creatinine:  12/29/2023: 21/2.18, GFR 27, glucose 128 Lab Results  Component Value Date   BUN 26 (H) 07/20/2023   BUN 32 (H) 05/26/2023   CREATININE 2.47 (H) 07/20/2023   CREATININE 2.86 (H) 05/26/2023   She was on hemodialysis previously.  -+ HL; last set of lipids: Lab Results  Component Value Date   CHOL 210 (H) 05/26/2023   HDL 44 05/26/2023   LDLCALC 117 (H) 05/26/2023   TRIG 284 (H) 05/26/2023   CHOLHDL 4.8 (H) 05/26/2023  06/24/2021: 130/299/42/42 On Crestor  20.  - last eye exam was 01/2023: + DR reportedly. + h/o retinal  detachment, + cataracts.  She was getting IO injections, not recently >> now needs Laser Sx. She also sees Dr. Tobie (retina specialist).  - she has numbness and tingling in her feet.  She changed from Lyrica  to Neurontin 300 mg, then back on Lyrica  [facility]  Last foot exam 01/12/2024.  Pt has FH of DM in M, F.  She also has a history of NAFLD, OSA (but not on CPAP, since she could not tolerate it-now on nasal pillows), gout.  She had a kidney transplant 12/17/2021. Now on Prednisone  5 mg daily.   ROS:  + numbness and tingling in his feet  Past Medical History:  Diagnosis Date   Anemia associated with chronic renal failure    Arthritis    ASCUS of cervix with negative high risk HPV 06/2017   Back pain    Chest pain    CHF (congestive heart failure) (HCC)    CKD (chronic kidney disease), stage IV (HCC) no dialysis yet   nephrologist-  dr montie dakins-- scheduled next visit 09-08-2017 (lov 07-05-2017)   Constipation    Diabetic retinopathy (HCC)    Dialysis patient    Edema, lower extremity    Elevated transaminase level    Family history of breast cancer    Family history of ovarian cancer    Family history of stomach cancer    Fatty liver    FOLLOWED BY DR ABRAN   GERD (gastroesophageal reflux disease)    Hypertension    Idiopathic gout of multiple sites    09-05-2017 per pt stable   Insulin  dependent type 2 diabetes mellitus (HCC)    followed by dr vincente   Joint pain    Kidney transplant recipient 12/17/2021   [facility]   Mixed hyperlipidemia    OSA (obstructive sleep apnea)    per study 10-20-2015 mild osa w/ AHI 10.3/hr;  09-05-2017 per pt has not used cpap since 1 yr ago   Palpitations    Peripheral neuropathy    feet   Pure hypercholesterolemia 05/21/2021   S/P arteriovenous (AV) fistula creation 07-04-2015  w/ revision 02-14-2017   left braniocephilic   Shortness of breath    Sleep apnea    Trigger thumb, right thumb    Umbilical hernia     Vitamin D deficiency    Past Surgical History:  Procedure Laterality Date   A/V FISTULAGRAM N/A 04/01/2021   Procedure: A/V FISTULAGRAM;  Surgeon: Lanis Fonda BRAVO, MD;  Location: [facility] INVASIVE CV LAB;  Service: Cardiovascular;  Laterality: N/A;   ABDOMINAL HYSTERECTOMY  07/11/2000   dr gott   TAH/BSO for irregular bleeding and leiomyoma   AV FISTULA PLACEMENT Left 07/04/2015   Procedure: ARTERIOVENOUS (AV) FISTULA CREATION-LEFT;  Surgeon: Lynwood JONETTA Collum, MD;  Location: [facility] OR;  Service: Vascular;  Laterality: Left;   BREAST BIOPSY Right    benign   CARPAL TUNNEL RELEASE Bilateral 1993 and 1994   CESAREAN SECTION  1993:  09-25-1999   BILATERAL TUBAL LIGATION W/ LAST C/S   DILATION AND CURETTAGE OF UTERUS     EXPLORATORY LAPARTOMY / LYSIS ADHESIONS/  BILATERAL SALPINGOOPHORECTOMY  07-20-2011  dr s. vonzell  [facility]   EYE SURGERY  09-2012   FISTULA SUPERFICIALIZATION Left 08/28/2015   Procedure: BRACHIOCEPHALIC FISTULA SUPERFICIALIZATION;  Surgeon: Lynwood JONETTA Collum, MD;  Location: [facility] OR;  Service: Vascular;  Laterality: Left;   KIDNEY TRANSPLANT  12/17/2021   PATCH ANGIOPLASTY Left 02/14/2017   Procedure: PATCH ANGIOPLASTY;  Surgeon: Harvey Carlin BRAVO, MD;  Location: [facility] OR;  Service: Vascular;  Laterality: Left;   PELVIC LAPAROSCOPY  1994   exploration for ectopic preg.   PERIPHERAL VASCULAR BALLOON ANGIOPLASTY  04/01/2021   Procedure: PERIPHERAL VASCULAR BALLOON ANGIOPLASTY;  Surgeon: Lanis Fonda BRAVO, MD;  Location: [facility] INVASIVE CV LAB;  Service: Cardiovascular;;   RETINAL DETACHMENT SURGERY Left 2015   REVISON OF ARTERIOVENOUS FISTULA Left 02/14/2017   Procedure: REVISON OF ARTERIOVENOUS FISTULA  LEFT ARM;  Surgeon: Harvey Carlin BRAVO, MD;  Location: [facility] OR;  Service: Vascular;  Laterality: Left;   REVISON OF ARTERIOVENOUS FISTULA Left 07/24/2021   Procedure: REVISON OF ARTERIOVENOUS FISTULA ARM;  Surgeon: Eliza Lonni RAMAN, MD;  Location: [facility] OR;  Service: Vascular;  Laterality: Left;    TRIGGER FINGER RELEASE Left 2015   thumb, also done in 2022   TRIGGER FINGER RELEASE Right 09/09/2017   Procedure: RELEASE TRIGGER FINGER/A-1 PULLEY RIGHT THUMB;  Surgeon: Yvone Rush, MD;  Location: [facility];  Service: Orthopedics;  Laterality: Right;   TUBAL LIGATION  09-1999   Social History   Socioeconomic History   Marital status: Married    Spouse name: Not on file   Number of children: 2   Years of education: Not on file   Highest education level: Some college, no degree  Occupational History   Occupation: medical billing and insurance  Tobacco Use   Smoking status: Never    Passive exposure: Current   Smokeless tobacco: Never  Vaping Use   Vaping status: Never Used  Substance and Sexual Activity   Alcohol use: Not Currently    Comment: socially   Drug use: No   Sexual activity: Yes    Partners: Male    Birth control/protection: Surgical    Comment: HYST-1st intercourse 53 yo-Fewer than 5 partners  Other Topics Concern   Not on file  Social History Narrative   Not on file   Social Drivers of Health   Financial Resource Strain: Low Risk  (05/19/2023)   Overall Financial Resource Strain (CARDIA)    Difficulty of Paying Living Expenses: Not very hard  Food Insecurity: No Food Insecurity (01/25/2024)   Hunger Vital Sign    Worried About Running Out of Food in the Last Year: Never true    Ran Out of Food in the Last Year: Never true  Transportation Needs: No Transportation Needs (01/25/2024)   PRAPARE - Administrator, Civil Service (Medical): No    Lack of Transportation (Non-Medical): No  Physical Activity: Insufficiently Active (12/01/2023)   Exercise Vital Sign    Days of Exercise per Week: 3 days    Minutes of Exercise per Session: 20 min  Stress: No Stress Concern Present (12/01/2023)  [employer] of Occupational Health - Occupational Stress Questionnaire    Feeling of Stress: Not at all  Social Connections: Socially  Integrated (12/01/2023)   Social Connection and Isolation Panel    Frequency of Communication with Friends and Family: More than three times a week    Frequency of Social Gatherings with Friends and Family: More than three times a week    Attends Religious Services: 1 to 4 times per year    Active Member of [employer] or Organizations: Yes    Attends Engineer, structural: More than 4 times per year    Marital Status: Married  Catering manager Violence: Not At Risk (01/25/2024)   Humiliation, Afraid, Rape, and Kick questionnaire    Fear of Current or Ex-Partner: No    Emotionally Abused: No    Physically Abused: No    Sexually Abused: No   Current Outpatient Medications on File Prior to Visit  Medication Sig Dispense Refill   Acetaminophen  325 MG CAPS Take 650 mg by mouth.     albuterol  (VENTOLIN  HFA) 108 (90 Base) MCG/ACT inhaler Inhale 2 puffs into the lungs every 6 (six) hours as needed for wheezing or shortness of breath. 8 g 2   allopurinol  (ZYLOPRIM ) 100 MG tablet TAKE 1 TABLET BY MOUTH EVERY DAY 30 tablet 8   aspirin EC 81 MG tablet Take 1 tablet by mouth daily.     Continuous Glucose Sensor (DEXCOM G7 SENSOR) MISC 1 % by Does not apply route continuous. 1 each, Every 14 days     furosemide  (LASIX ) 80 MG tablet Take 80 mg by mouth 2 (two) times daily.     Insulin  Disposable Pump (OMNIPOD 5 G7 PODS, GEN 5,) MISC 1 each by Does not apply route every other day. 15 each 11   Insulin  Lispro-aabc (LYUMJEV  KWIKPEN) 200 UNIT/ML KwikPen Use up to 100 units a day in the insulin  pump. We need the U200 concentration.  DAW1. 45 mL 3   Insulin  Pen Needle 32G X 4 MM MISC Use 2x a day 200 each 3   Insulin  Syringe/Needle U-500 (BD INSULIN  SYRINGE U-500) 31G X 0.5 ML MISC Use to inject insulin  3 times a day 300 each 5   LYUMJEV  100 UNIT/ML SOLN SMARTSIG:0-75 Unit(s)     methenamine (HIPREX) 1 g tablet Take 1 g by mouth daily.     Microlet Lancets MISC Use as directed to check blood sugars 4  times per day dx: e11.22 300 each 2   mycophenolate (MYFORTIC) 180 MG EC tablet Take 180 mg by mouth 2 (two) times daily. Patient is taking 2 tablets twice daily     NIFEdipine  (PROCARDIA -XL/NIFEDICAL-XL) 30 MG 24 hr tablet Take 2 tablets by mouth in the morning and one tablet by mouth In the evening. 270 tablet 0   omeprazole  (PRILOSEC) 20 MG capsule Take 1 capsule (20 mg total) by mouth daily. 90 capsule 3   predniSONE  (DELTASONE ) 5 MG tablet Take 5 mg by mouth daily with breakfast.     pregabalin  (LYRICA ) 150 MG capsule TAKE 1 CAPSULE BY MOUTH EVERY DAY 90 capsule 1   rosuvastatin  (CRESTOR ) 20 MG tablet Take 1 tablet (20 mg total) by mouth daily. 90 tablet 3   Semaglutide , 2 MG/DOSE, (OZEMPIC , 2 MG/DOSE,) 8 MG/3ML SOPN Inject 2 mg into the skin once a week. 9 mL 3   sevelamer carbonate (RENVELA) 800 MG tablet Take 1,600 mg by mouth 3 (three) times daily.     sulfamethoxazole -trimethoprim  (BACTRIM )  400-80 MG tablet Take 1 tablet by mouth 3 (three) times a week. Monday, Wednesday, Friday     No current facility-administered medications on file prior to visit.   Allergies  Allergen Reactions   Novolog  [Insulin  Aspart]     Elevated glucose readings    Family History  Problem Relation Age of Onset   Diabetes Mother    Hypertension Mother    Thyroid  disease Mother    Ovarian cancer Mother        dx 44s   Diabetes Father    Hypertension Father    Cancer Father        STOMACH   Stomach cancer Father 55   Breast cancer Maternal Aunt 35   Cancer Maternal Aunt    Cancer Paternal Uncle        unknown type, dx >50   Stroke Maternal Grandmother    Stroke Maternal Grandfather    Stroke Paternal Grandmother    Cancer Paternal Grandfather        unknown type, mets to brain   Cancer Cousin        unknown type, dx 69s, paternal first cousin   Cancer Other        unknown type, father's first cousin   Cancer Other        unknown types, 4 of mother's first cousins   PE: BP 124/80   Pulse  84   Ht 5' 1.75 (1.568 m)   Wt 222 lb 3.2 oz (100.8 kg)   SpO2 99%   BMI 40.97 kg/m  Wt Readings from Last 15 Encounters:  02/23/24 222 lb 3.2 oz (100.8 kg)  02/23/24 221 lb (100.2 kg)  01/26/24 229 lb (103.9 kg)  01/12/24 221 lb (100.2 kg)  09/05/23 227 lb 9.6 oz (103.2 kg)  07/20/23 230 lb 6.4 oz (104.5 kg)  05/26/23 233 lb 12.8 oz (106.1 kg)  04/21/23 222 lb 4.8 oz (100.8 kg)  03/15/23 217 lb (98.4 kg)  02/22/23 210 lb (95.3 kg)  11/17/22 212 lb 6.4 oz (96.3 kg)  11/10/22 215 lb 12.8 oz (97.9 kg)  11/18/21 218 lb (98.9 kg)  11/03/21 220 lb 9.6 oz (100.1 kg)  09/09/21 224 lb 12.8 oz (102 kg)   Constitutional: overweight, in NAD Eyes:  EOMI, no exophthalmos ENT: no neck masses, no cervical lymphadenopathy Cardiovascular: Tachycardia, RR, No MRG, + periankle B edema Respiratory: CTA B Musculoskeletal: no deformities Skin:no rashes Neurological: no tremor with outstretched hands  ASSESSMENT: 1. DM2, insulin -dependent, uncontrolled, with complications - ESRD, on HD, awaiting kidney transplant ([facility]) - DR - PN  06/24/2021: Glucose 135, C-peptide 10.9  2. HL  3.  Obesity class III  PLAN:  1. Patient with longstanding, uncontrolled, type 2 diabetes, very insulin  resistant, previously on U-500 concentrated insulin , but on high doses of U100 insulin  basal-bolus regimen afterwards. She was able to start the OmniPod 5 before our visit but she came off in the month prior to the visit due to using too high amount insulin .  I advised her to try to restart the pump and called in U200 insulin .  At last visit blood, she wanted to switch, Ozempic  to see if she gets more appetite suppression with Ozempic . -At last visit HbA1c was lower, at 8.2%. CGM interpretation: -At today's visit, we reviewed her CGM downloads: It appears that 76% of values are in target range (goal >70%), while 24% are higher than 180 (goal <25%), and 0% are lower than 70 (goal <4%).  The calculated  average blood  sugar is 161.  The projected HbA1c for the next 3 months (GMI) is approximately 7.2%. -Reviewing the CGM trends,  sugars are significantly improved from before, but still increasing after approximately 2 to 3 PM, and, per review of the pump downloads, this may be related to her not introducing enough carbs into the pump for her meals.  She mentioned that she was under the impression that she could not enter more than 20 g of carbs at a time.  We discussed that she needs to enter as many as she is eating.  Since we are making this change, I am afraid that continuing her very strict insulin  to carb ratios will drop her blood sugars too low so I recommended to relax her ICR.  We did several exercises to understand how to calculate the insulin  bolus from the number of carbs she is eating. - She does mention that Ozempic  is also not coming down her appetite too much and we discussed that her body probably got used to the GLP-1 receptor agonist.  She had the same problem with Mounjaro  before.  For now, I recommend to continue with these and to let me know before running out of Ozempic  if she wanted me to refill the Ozempic  or the Mounjaro . -  she did not look into other insulin  pump since last visit -again recommended to do so. - I suggested to:  Patient Instructions  Please continue: - Ozempic  2 mg weekly   For the pump: - Basal rates: 12 am: 0.95 units/h - Insulin  to carb ratio: 12 am:  1:2 >> 1:5 - Target: 12 am: 110-110 - Correction factor (insulin  sensitivity factor):  12 am: 50 - Active insulin  time: 4h  Start all the boluses before meals.  Look up: - iLET pump and twiist pump.  Please come back in 2-3 months.  - we we will recheck an HbA1c at next visit - advised to check sugars at different times of the day - 4x a day, rotating check times - advised for yearly eye exams >> she is UTD - return to clinic in 3 months  2. HL - Latest lipid panel was reviewed from 05/2023: LDL above  target, triglycerides elevated: Lab Results  Component Value Date   CHOL 210 (H) 05/26/2023   HDL 44 05/26/2023   LDLCALC 117 (H) 05/26/2023   TRIG 284 (H) 05/26/2023   CHOLHDL 4.8 (H) 05/26/2023  - She continues on Crestor  20 mg daily, with the dose increased after the above results returned.  She tolerates it well.  3.  Obesity class III -She was going to the [facility] weight management clinic in the past, but she mentioned that the suggested diet was not a good fit for her due to hemodialysis -we had to stop Ozempic  as her lipase was very high, at 499, down from 523, but still very high.  However, her PCP contacted me since then regarding the elevated lipase likely related to her CKD.  Earlier in the year, PCP was able to successfully start her on Mounjaro  which she tolerated well. At last visit, she had gained 5 pounds since the previous visit.  She was interested in going back to Ozempic .  I sent a new prescription to her pharmacy.  Lela Fendt, MD PhD [facility] Endocrinology

## 2024-02-23 NOTE — Patient Instructions (Addendum)
 Please continue: - Ozempic  2 mg weekly   For the pump: - Basal rates: 12 am: 0.95 units/h - Insulin  to carb ratio: 12 am:  1:2 >> 1:5 - Target: 12 am: 110-110 - Correction factor (insulin  sensitivity factor):  12 am: 50 - Active insulin  time: 4h  Start all the boluses before meals.  Look up: - iLET pump and twiist pump.  Please come back in 2-3 months.

## 2024-02-27 ENCOUNTER — Telehealth: Payer: Self-pay

## 2024-02-27 NOTE — Patient Instructions (Signed)
 [facility] - I am sorry I was unable to reach you today for our scheduled appointment. I work with [name], MD and am calling to support your healthcare needs. Please contact me at [phone] at your earliest convenience. I look forward to speaking with you soon.   Thank you,  Clayborne Ly RN BSN CCM [place]  [facility], [facility] Nurse Care Coordinator  Direct Dial: [phone] Website: [name]

## 2024-02-29 ENCOUNTER — Ambulatory Visit: Payer: Self-pay | Admitting: Nurse Practitioner

## 2024-02-29 ENCOUNTER — Encounter: Payer: Self-pay | Admitting: Nurse Practitioner

## 2024-02-29 DIAGNOSIS — B977 Papillomavirus as the cause of diseases classified elsewhere: Secondary | ICD-10-CM

## 2024-02-29 DIAGNOSIS — R87612 Low grade squamous intraepithelial lesion on cytologic smear of cervix (LGSIL): Secondary | ICD-10-CM

## 2024-02-29 LAB — CYTOLOGY - PAP
Comment: NEGATIVE
High risk HPV: POSITIVE — AB

## 2024-03-01 ENCOUNTER — Other Ambulatory Visit: Payer: Self-pay

## 2024-03-01 DIAGNOSIS — T82898A Other specified complication of vascular prosthetic devices, implants and grafts, initial encounter: Secondary | ICD-10-CM

## 2024-03-01 NOTE — Telephone Encounter (Signed)
See Result note. Encounter closed.

## 2024-03-15 ENCOUNTER — Telehealth: Payer: Self-pay

## 2024-03-16 ENCOUNTER — Other Ambulatory Visit: Payer: Self-pay

## 2024-03-16 ENCOUNTER — Telehealth: Payer: Self-pay

## 2024-03-16 MED ORDER — LYUMJEV KWIKPEN 200 UNIT/ML ~~LOC~~ SOPN: PEN_INJECTOR | SUBCUTANEOUS | 3 refills | Status: AC

## 2024-03-16 NOTE — Patient Instructions (Signed)
 [facility] - I am sorry I was unable to reach you today for our scheduled appointment. I work with [name], MD and am calling to support your healthcare needs. Please contact me at [phone] at your earliest convenience. I look forward to speaking with you soon.   Thank you,  Clayborne Ly RN BSN CCM [place]  [facility], [facility] Nurse Care Coordinator  Direct Dial: [phone] Website: [name]

## 2024-03-21 ENCOUNTER — Telehealth: Payer: Self-pay

## 2024-03-21 NOTE — Telephone Encounter (Signed)
 Spoke with patient and rescheduled visit for 11/06 due to Dr Verne no longer in clinic on 11/20.

## 2024-03-21 NOTE — Telephone Encounter (Signed)
 Attempted to call for surgery scheduling. LVM

## 2024-03-22 ENCOUNTER — Encounter (HOSPITAL_COMMUNITY): Payer: Self-pay | Admitting: Vascular Surgery

## 2024-03-22 ENCOUNTER — Encounter: Admitting: Nurse Practitioner

## 2024-03-22 ENCOUNTER — Other Ambulatory Visit: Payer: Self-pay

## 2024-03-22 NOTE — Progress Notes (Signed)
 PCP - Dr Grayce Slocumb  Cardiologist - none Endocrinology - Dr Lela Fendt Nephrology - Dr Bernardino Gasman  Chest x-ray - n/a EKG - 07/20/23 Stress Test - 05/25/18 ECHO - 02/22/24 Cardiac Cath - n/a  ICD Pacemaker/Loop - n/a  Sleep Study -  Yes, mild (09/25/18) CPAP - does not use CPAP  Diabetes Type 2 Dexcom G7 System, sensor on right arm  Hold Ozempic  7 days prior to procedure.  Last dose was on Sunday, 03/18/24.  Insulin  Lispro-aabc (Lyumjev ) Insulin  Pump - will not be using insulin  pump.  Patient will take inulin with meals, no bedtime dose.  Instructed not to take insulin  on DOS unless CBG is greater than 220 mg/ DL.  If CBG >220 mg/dL, pt to take 1/2 of insulin  correction dose.     Blood Thinner Instructions:  n/a  Aspirin Instructions: Continue per MD.  NPO   Anesthesia review: Yes  STOP now taking any Aspirin (unless otherwise instructed by your surgeon), Aleve , Naproxen , Ibuprofen , Motrin , Advil , Goody's, BC's, all herbal medications, fish oil, and all vitamins.   Coronavirus Screening Do you have any of the following symptoms:  Cough yes/no: No Fever (>100.3F)  yes/no: No Runny nose yes/no: No Sore throat yes/no: No Difficulty breathing/shortness of breath  yes/no: No  Have you traveled in the last 14 days and where? Yes [place].    Patient verbalized understanding of instructions that were given via phone.

## 2024-03-23 NOTE — Progress Notes (Signed)
 Anesthesia Chart Review: Cassandra MICHAE Campos  Case: [id number] Date/Time: 03/26/24 0915   Procedure: REVISON OF ARTERIOVENOUS FISTULA (Left)   Anesthesia type: Monitor Anesthesia Care   Diagnosis: ESRD (end stage renal disease) (HCC) [N18.6]   Pre-op  diagnosis: ESRD   [facility] / [facility]   Surgeons: Lanis Fonda BRAVO, MD       DISCUSSION: Patient is a 53 year old female scheduled for the above procedure. 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 She wanted to pursue banding of the AVF rather than ligation in case it was needed in the future.   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   Creatinine on 12/29/2023 at her annual [facility] visit was 2.18 with eGFR 27 (previously Cr 2.8, eGFR 20 04/26/2023). H/H 13.1/40/9, PLT 192K. She is on prednisone  5 mg daily, Myfortic 360 mg BID, Bactrim  400-80 mg 1 3x/week.   A1 8.2% on 01/12/2024.  Has OmniPod 5 insulin  pump with Dexcom G7 CGM integration. On Lyumjev  200 units/mL. Per PAT RN notation, she said she will be on her insulin  with meals regimen and not using her pump. She is also on Ozempic , last dose documented as 03/18/2024.   Anesthesia team to evaluate on the day of surgery.    VS: Ht 5' 2 (1.575 m)   Wt 95.4 kg   BMI 38.46 kg/m  BP Readings from Last 3 Encounters:  02/23/24 124/80  02/23/24 120/64  01/26/24 119/74   Pulse Readings from Last 3 Encounters:  02/23/24 84  02/23/24 87  01/26/24  74    PROVIDERS: [name], MD is PCP  Trixie File, MD is her cardiologist Marlee Motto, MD is nephrologist. She is also followed annually at the [facility], last visit 12/29/2023 by Theodoro Hatchet, MD.  Verne Motto, MD is ID (for recurrent UTI, immunosuppression)   LABS: For day of surgery.    EKG: 07/20/2023: Sinus Tachycardia at 115 bpm  -Poor R-wave progression -may be secondary to pulmonary disease  consider old anterior infarct. -Nonspecific T-abnormality. Low voltage with rightward P-axis and rotation -possible pulmonary disease.   CV: Echo 02/22/2024: IMPRESSIONS   1. Left ventricular ejection fraction, by estimation, is 55 to 60%. Left  ventricular ejection fraction by 3D volume is 55 %. The left ventricle has  normal function. The left ventricle has no regional wall motion  abnormalities. Left ventricular diastolic   parameters are consistent with Grade II diastolic dysfunction  (pseudonormalization). The average left ventricular global longitudinal  strain is -22.3 %.   2. Right ventricular systolic function is normal. The right ventricular  size is normal. There is mildly elevated pulmonary artery systolic  pressure. The estimated right ventricular systolic pressure is 41.4 mmHg.   3. The mitral valve is normal in structure. No evidence of mitral valve  regurgitation. No evidence of mitral stenosis.   4. The aortic valve is tricuspid. Aortic valve regurgitation is not  visualized. No aortic stenosis is present.   5. The inferior vena cava  is normal in size with greater than 50%  respiratory variability, suggesting right atrial pressure of 3 mmHg.    Nuclear stress test 07/22/2021 ([facility]): 1.) LIMITATIONS: None.  2.) MYOCARDIAL PERFUSION: Normal.  3.) LEFT VENTRICULAR EJECTION FRACTION: Normal.  4.) REGIONAL WALL MOTION: Normal.    Long term monitor 07/20/2023: No results. Not completed?. 07/18/2018 - 07/22/2018 results showed:   1.  SR/ST  2. Occassional PVCs and short runs of SVT    Past Medical History:  Diagnosis Date   Anemia associated with chronic renal failure    blood transfusion 01/01/22 in [facility]   Arthritis    ASCUS of cervix with negative high risk HPV 06/2017   Back pain    hx - resolved per pt 03/22/24   Chest pain    hx - reolved per pt 03/22/24   CHF (congestive heart failure) (HCC)    CKD (chronic kidney disease), stage IV (HCC) no dialysis yet   nephrologist-  dr montie dakins-- scheduled next visit 09-08-2017 (lov 07-05-2017)   Constipation    hx   Diabetic retinopathy (HCC)    Dialysis patient    in past prior to transplant in 2023 - dialysis x 4 yrs   Edema, lower extremity    hx   Elevated transaminase level    Family history of breast cancer    Family history of ovarian cancer    Family history of stomach cancer    Fatty liver    FOLLOWED BY DR ABRAN   GERD (gastroesophageal reflux disease)    Hypertension    Idiopathic gout of multiple sites    09-05-2017 per pt stable   Insulin  dependent type 2 diabetes mellitus (HCC)    followed by dr vincente   Joint pain    occasional   Kidney transplant recipient 12/17/2021   [facility]   Mixed hyperlipidemia    OSA (obstructive sleep apnea)    per study 10-20-2015 mild osa w/ AHI 10.3/hr;  09-05-2017 per pt has not used cpap since 1 yr ago   Palpitations    hx   Peripheral neuropathy    feet   Pneumonia    x 1   Pure hypercholesterolemia 05/21/2021   S/P arteriovenous (AV) fistula creation 07-04-2015  w/ revision 02-14-2017   left braniocephilic   Shortness of breath    hx prior to transplant, no current problems with SOB   Sleep apnea    Trigger thumb, right thumb    Umbilical hernia    Vitamin D deficiency     Past Surgical History:  Procedure Laterality Date   A/V FISTULAGRAM N/A 04/01/2021   Procedure: A/V FISTULAGRAM;  Surgeon: Lanis Fonda BRAVO, MD;  Location: [facility] INVASIVE CV LAB;  Service: Cardiovascular;   Laterality: N/A;   ABDOMINAL HYSTERECTOMY  07/11/2000   dr gott   TAH/BSO for irregular bleeding and leiomyoma   AV FISTULA PLACEMENT Left 07/04/2015   Procedure: ARTERIOVENOUS (AV) FISTULA CREATION-LEFT;  Surgeon: Lynwood JONETTA Collum, MD;  Location: [facility] OR;  Service: Vascular;  Laterality: Left;   BREAST BIOPSY Right    benign   CARPAL TUNNEL RELEASE Bilateral 1993 and 1994   CESAREAN SECTION  1993:  09-25-1999   BILATERAL TUBAL LIGATION W/ LAST C/S   DILATION AND CURETTAGE OF UTERUS     EXPLORATORY LAPARTOMY / LYSIS ADHESIONS/  BILATERAL SALPINGOOPHORECTOMY  07-20-2011  dr s. vonzell  [facility]   EYE SURGERY  09-2012   FISTULA SUPERFICIALIZATION Left 08/28/2015  Procedure: BRACHIOCEPHALIC FISTULA SUPERFICIALIZATION;  Surgeon: Lynwood JONETTA Collum, MD;  Location: [facility] OR;  Service: Vascular;  Laterality: Left;   KIDNEY TRANSPLANT  12/17/2021   PATCH ANGIOPLASTY Left 02/14/2017   Procedure: PATCH ANGIOPLASTY;  Surgeon: Harvey Carlin BRAVO, MD;  Location: [facility] OR;  Service: Vascular;  Laterality: Left;   PELVIC LAPAROSCOPY  1994   exploration for ectopic preg.   PERIPHERAL VASCULAR BALLOON ANGIOPLASTY  04/01/2021   Procedure: PERIPHERAL VASCULAR BALLOON ANGIOPLASTY;  Surgeon: Lanis Fonda BRAVO, MD;  Location[facility] INVASIVE CV LAB;  Service: Cardiovascular;;   RETINAL DETACHMENT SURGERY Left 2015   REVISON OF ARTERIOVENOUS FISTULA Left 02/14/2017   Procedure: REVISON OF ARTERIOVENOUS FISTULA  LEFT ARM;  Surgeon: Harvey Carlin BRAVO, MD;  Location: [facility] OR;  Service: Vascular;  Laterality: Left;   REVISON OF ARTERIOVENOUS FISTULA Left 07/24/2021   Procedure: REVISON OF ARTERIOVENOUS FISTULA ARM;  Surgeon: Eliza Lonni RAMAN, MD;  Location: [facility] OR;  Service: Vascular;  Laterality: Left;   TRIGGER FINGER RELEASE Left 2015   thumb, also done in 2022   TRIGGER FINGER RELEASE Right 09/09/2017   Procedure: RELEASE TRIGGER FINGER/A-1 PULLEY RIGHT THUMB;  Surgeon: Yvone Rush, MD;  Location: [facility];   Service: Orthopedics;  Laterality: Right;   TUBAL LIGATION  09-1999    MEDICATIONS: No current facility-administered medications for this encounter.    albuterol  (VENTOLIN  HFA) 108 (90 Base) MCG/ACT inhaler   allopurinol  (ZYLOPRIM ) 100 MG tablet   aspirin EC 81 MG tablet   Cholecalciferol (VITAMIN D-3 PO)   furosemide  (LASIX ) 80 MG tablet   Insulin  Lispro-aabc (LYUMJEV  KWIKPEN) 200 UNIT/ML KwikPen   methenamine (HIPREX) 1 g tablet   mycophenolate (MYFORTIC) 180 MG EC tablet   NIFEdipine  (PROCARDIA -XL/NIFEDICAL-XL) 30 MG 24 hr tablet   omeprazole  (PRILOSEC) 20 MG capsule   predniSONE  (DELTASONE ) 5 MG tablet   pregabalin  (LYRICA ) 150 MG capsule   rosuvastatin  (CRESTOR ) 20 MG tablet   Semaglutide , 2 MG/DOSE, (OZEMPIC , 2 MG/DOSE,) 8 MG/3ML SOPN   sulfamethoxazole -trimethoprim  (BACTRIM ) 400-80 MG tablet   Acetaminophen  325 MG CAPS   Continuous Glucose Sensor (DEXCOM G7 SENSOR) MISC   Insulin  Disposable Pump (OMNIPOD 5 G7 PODS, GEN 5,) MISC   Insulin  Pen Needle 32G X 4 MM MISC   Insulin  Syringe/Needle U-500 (BD INSULIN  SYRINGE U-500) 31G X 0.5 ML MISC   Microlet Lancets MISC    Isaiah Ruder, PA-C Surgical Short Stay/Anesthesiology [facility] Phone [phone] [facility] Phone [phone] 03/23/2024 10:08 AM

## 2024-03-26 ENCOUNTER — Ambulatory Visit (HOSPITAL_COMMUNITY): Payer: Self-pay | Admitting: Vascular Surgery

## 2024-03-26 ENCOUNTER — Encounter (HOSPITAL_COMMUNITY): Payer: Self-pay | Admitting: Vascular Surgery

## 2024-03-26 ENCOUNTER — Ambulatory Visit (HOSPITAL_COMMUNITY)
Admission: RE | Admit: 2024-03-26 | Discharge: 2024-03-26 | Disposition: A | Discharge status: Home / Self Care | Attending: Vascular Surgery | Admitting: Vascular Surgery

## 2024-03-26 ENCOUNTER — Encounter (HOSPITAL_COMMUNITY)
Admission: RE | Disposition: A | Discharge status: Home / Self Care | Payer: Self-pay | Source: Home / Self Care | Attending: Vascular Surgery

## 2024-03-26 DIAGNOSIS — I5032 Chronic diastolic (congestive) heart failure: Secondary | ICD-10-CM | POA: Insufficient documentation

## 2024-03-26 DIAGNOSIS — I132 Hypertensive heart and chronic kidney disease with heart failure and with stage 5 chronic kidney disease, or end stage renal disease: Secondary | ICD-10-CM | POA: Insufficient documentation

## 2024-03-26 DIAGNOSIS — K760 Fatty (change of) liver, not elsewhere classified: Secondary | ICD-10-CM | POA: Diagnosis not present

## 2024-03-26 DIAGNOSIS — Z992 Dependence on renal dialysis: Secondary | ICD-10-CM | POA: Insufficient documentation

## 2024-03-26 DIAGNOSIS — K219 Gastro-esophageal reflux disease without esophagitis: Secondary | ICD-10-CM | POA: Diagnosis not present

## 2024-03-26 DIAGNOSIS — D631 Anemia in chronic kidney disease: Secondary | ICD-10-CM | POA: Insufficient documentation

## 2024-03-26 DIAGNOSIS — Z7985 Long-term (current) use of injectable non-insulin antidiabetic drugs: Secondary | ICD-10-CM | POA: Diagnosis not present

## 2024-03-26 DIAGNOSIS — N186 End stage renal disease: Secondary | ICD-10-CM | POA: Diagnosis present

## 2024-03-26 DIAGNOSIS — E782 Mixed hyperlipidemia: Secondary | ICD-10-CM | POA: Insufficient documentation

## 2024-03-26 DIAGNOSIS — T82898A Other specified complication of vascular prosthetic devices, implants and grafts, initial encounter: Secondary | ICD-10-CM | POA: Insufficient documentation

## 2024-03-26 DIAGNOSIS — E1122 Type 2 diabetes mellitus with diabetic chronic kidney disease: Secondary | ICD-10-CM | POA: Insufficient documentation

## 2024-03-26 DIAGNOSIS — E1142 Type 2 diabetes mellitus with diabetic polyneuropathy: Secondary | ICD-10-CM | POA: Diagnosis not present

## 2024-03-26 DIAGNOSIS — Z79899 Other long term (current) drug therapy: Secondary | ICD-10-CM | POA: Diagnosis not present

## 2024-03-26 DIAGNOSIS — G458 Other transient cerebral ischemic attacks and related syndromes: Secondary | ICD-10-CM

## 2024-03-26 DIAGNOSIS — Z794 Long term (current) use of insulin: Secondary | ICD-10-CM | POA: Insufficient documentation

## 2024-03-26 DIAGNOSIS — Z940 Kidney transplant status: Secondary | ICD-10-CM | POA: Diagnosis not present

## 2024-03-26 DIAGNOSIS — G4733 Obstructive sleep apnea (adult) (pediatric): Secondary | ICD-10-CM | POA: Diagnosis not present

## 2024-03-26 DIAGNOSIS — E11319 Type 2 diabetes mellitus with unspecified diabetic retinopathy without macular edema: Secondary | ICD-10-CM | POA: Diagnosis not present

## 2024-03-26 HISTORY — PX: REVISON OF ARTERIOVENOUS FISTULA: SHX6074

## 2024-03-26 LAB — POCT I-STAT, CHEM 8
BUN: 20 mg/dL (ref 6–20)
Calcium, Ion: 1.16 mmol/L (ref 1.15–1.40)
Chloride: 106 mmol/L (ref 98–111)
Creatinine, Ser: 2.8 mg/dL — ABNORMAL HIGH (ref 0.44–1.00)
Glucose, Bld: 183 mg/dL — ABNORMAL HIGH (ref 70–99)
HCT: 42 % (ref 36.0–46.0)
Hemoglobin: 14.3 g/dL (ref 12.0–15.0)
Potassium: 3 mmol/L — ABNORMAL LOW (ref 3.5–5.1)
Sodium: 142 mmol/L (ref 135–145)
TCO2: 25 mmol/L (ref 22–32)

## 2024-03-26 LAB — GLUCOSE, CAPILLARY
Glucose-Capillary: 158 mg/dL — ABNORMAL HIGH (ref 70–99)
Glucose-Capillary: 207 mg/dL — ABNORMAL HIGH (ref 70–99)
Glucose-Capillary: 208 mg/dL — ABNORMAL HIGH (ref 70–99)
Glucose-Capillary: 215 mg/dL — ABNORMAL HIGH (ref 70–99)

## 2024-03-26 SURGERY — REVISON OF ARTERIOVENOUS FISTULA
Anesthesia: General | Laterality: Left

## 2024-03-26 MED ORDER — LIDOCAINE 2% (20 MG/ML) 5 ML SYRINGE
INTRAMUSCULAR | Status: DC | PRN
  Administered 2024-03-26: 100 mg via INTRAVENOUS

## 2024-03-26 MED ORDER — INSULIN ASPART 100 UNIT/ML IJ SOLN
4.0000 [IU] | Freq: Once | INTRAMUSCULAR | Status: AC
  Administered 2024-03-26: 4 [IU] via SUBCUTANEOUS

## 2024-03-26 MED ORDER — CHLORHEXIDINE GLUCONATE 4 % EX SOLN: 60.0000 mL | Freq: Once | CUTANEOUS | Status: DC

## 2024-03-26 MED ORDER — HYDROCODONE-ACETAMINOPHEN 5-325 MG PO TABS: 1.0000 | ORAL_TABLET | Freq: Four times a day (QID) | ORAL | 0 refills | Status: AC | PRN

## 2024-03-26 MED ORDER — OXYCODONE HCL 5 MG PO TABS: 5.0000 mg | ORAL_TABLET | Freq: Once | ORAL | Status: DC | PRN

## 2024-03-26 MED ORDER — CHLORHEXIDINE GLUCONATE 0.12 % MT SOLN
OROMUCOSAL | Status: AC
  Administered 2024-03-26: 15 mL via OROMUCOSAL
  Filled 2024-03-26: qty 15

## 2024-03-26 MED ORDER — FENTANYL CITRATE (PF) 100 MCG/2ML IJ SOLN
INTRAMUSCULAR | Status: AC
  Filled 2024-03-26: qty 2

## 2024-03-26 MED ORDER — ACETAMINOPHEN 10 MG/ML IV SOLN: 1000.0000 mg | Freq: Once | INTRAVENOUS | Status: DC | PRN

## 2024-03-26 MED ORDER — HEPARIN 6000 UNIT IRRIGATION SOLUTION
Status: DC | PRN
  Administered 2024-03-26: 1

## 2024-03-26 MED ORDER — EPHEDRINE SULFATE-NACL 50-0.9 MG/10ML-% IV SOSY
PREFILLED_SYRINGE | INTRAVENOUS | Status: DC | PRN
  Administered 2024-03-26: 10 mg via INTRAVENOUS

## 2024-03-26 MED ORDER — PROPOFOL 10 MG/ML IV BOLUS
INTRAVENOUS | Status: AC
  Filled 2024-03-26: qty 20

## 2024-03-26 MED ORDER — INSULIN ASPART 100 UNIT/ML IJ SOLN: 0.0000 [IU] | INTRAMUSCULAR | Status: DC | PRN

## 2024-03-26 MED ORDER — SODIUM CHLORIDE 0.9 % IV SOLN: INTRAVENOUS | Status: DC

## 2024-03-26 MED ORDER — CEFAZOLIN SODIUM-DEXTROSE 2-4 GM/100ML-% IV SOLN
2.0000 g | INTRAVENOUS | Status: AC
  Administered 2024-03-26: 2 g via INTRAVENOUS

## 2024-03-26 MED ORDER — HEPARIN 6000 UNIT IRRIGATION SOLUTION
Status: AC
  Filled 2024-03-26: qty 500

## 2024-03-26 MED ORDER — CHLORHEXIDINE GLUCONATE 0.12 % MT SOLN: 15.0000 mL | Freq: Once | OROMUCOSAL | Status: AC

## 2024-03-26 MED ORDER — FENTANYL CITRATE (PF) 250 MCG/5ML IJ SOLN
INTRAMUSCULAR | Status: DC | PRN
  Administered 2024-03-26 (×2): 50 ug via INTRAVENOUS

## 2024-03-26 MED ORDER — CEFAZOLIN SODIUM-DEXTROSE 2-4 GM/100ML-% IV SOLN
INTRAVENOUS | Status: AC
  Filled 2024-03-26: qty 100

## 2024-03-26 MED ORDER — HEMOSTATIC AGENTS (NO CHARGE) OPTIME
TOPICAL | Status: DC | PRN
  Administered 2024-03-26: 1 via TOPICAL

## 2024-03-26 MED ORDER — OXYCODONE HCL 5 MG/5ML PO SOLN: 5.0000 mg | Freq: Once | ORAL | Status: DC | PRN

## 2024-03-26 MED ORDER — ONDANSETRON HCL 4 MG/2ML IJ SOLN
INTRAMUSCULAR | Status: DC | PRN
  Administered 2024-03-26: 4 mg via INTRAVENOUS

## 2024-03-26 MED ORDER — 0.9 % SODIUM CHLORIDE (POUR BTL) OPTIME
TOPICAL | Status: DC | PRN
  Administered 2024-03-26: 1000 mL

## 2024-03-26 MED ORDER — FENTANYL CITRATE (PF) 100 MCG/2ML IJ SOLN: 25.0000 ug | INTRAMUSCULAR | Status: DC | PRN

## 2024-03-26 MED ORDER — PROPOFOL 10 MG/ML IV BOLUS
INTRAVENOUS | Status: DC | PRN
  Administered 2024-03-26: 150 mg via INTRAVENOUS

## 2024-03-26 MED ORDER — AMISULPRIDE (ANTIEMETIC) 5 MG/2ML IV SOLN: 10.0000 mg | Freq: Once | INTRAVENOUS | Status: DC | PRN

## 2024-03-26 MED ORDER — DEXAMETHASONE SOD PHOSPHATE PF 10 MG/ML IJ SOLN
INTRAMUSCULAR | Status: DC | PRN
  Administered 2024-03-26: 4 mg via INTRAVENOUS

## 2024-03-26 MED ORDER — LIDOCAINE HCL (PF) 1 % IJ SOLN
INTRAMUSCULAR | Status: AC
Start: 2024-03-26 — End: 2024-03-26
  Filled 2024-03-26: qty 30

## 2024-03-26 MED ORDER — ORAL CARE MOUTH RINSE
15.0000 mL | Freq: Once | OROMUCOSAL | Status: AC
Start: 2024-03-26 — End: 2024-03-26

## 2024-03-26 SURGICAL SUPPLY — 32 items
ARMBAND PINK RESTRICT EXTREMIT (MISCELLANEOUS) ×1 IMPLANT
BAG COUNTER SPONGE SURGICOUNT (BAG) ×1 IMPLANT
BALLOON MUSTANG 4.0X20 75 (BALLOONS) IMPLANT
BNDG ELASTIC 4X5.8 VLCR STR LF (GAUZE/BANDAGES/DRESSINGS) ×1 IMPLANT
CANISTER SUCTION 3000ML PPV (SUCTIONS) ×1 IMPLANT
CLIP TI MEDIUM 6 (CLIP) ×1 IMPLANT
CLIP TI WIDE RED SMALL 6 (CLIP) ×1 IMPLANT
COVER PROBE W GEL 5X96 (DRAPES) IMPLANT
DERMABOND ADVANCED .7 DNX12 (GAUZE/BANDAGES/DRESSINGS) ×1 IMPLANT
ELECTRODE REM PT RTRN 9FT ADLT (ELECTROSURGICAL) ×1 IMPLANT
GLOVE BIOGEL PI IND STRL 8 (GLOVE) ×1 IMPLANT
GOWN STRL REUS W/ TWL LRG LVL3 (GOWN DISPOSABLE) ×2 IMPLANT
GOWN STRL REUS W/TWL 2XL LVL3 (GOWN DISPOSABLE) ×2 IMPLANT
KIT BASIN OR (CUSTOM PROCEDURE TRAY) ×1 IMPLANT
KIT ENCORE 26 ADVANTAGE (KITS) IMPLANT
KIT TURNOVER KIT B (KITS) ×1 IMPLANT
PACK CV ACCESS (CUSTOM PROCEDURE TRAY) ×1 IMPLANT
PAD ARMBOARD POSITIONER FOAM (MISCELLANEOUS) ×2 IMPLANT
SLING ARM FOAM STRAP LRG (SOFTGOODS) IMPLANT
SOLN 0.9% NACL POUR BTL 1000ML (IV SOLUTION) ×1 IMPLANT
SOLN STERILE WATER BTL 1000 ML (IV SOLUTION) ×1 IMPLANT
SPIKE FLUID TRANSFER (MISCELLANEOUS) ×1 IMPLANT
STAPLER SKIN PROX 35W (STAPLE) IMPLANT
STOPCOCK MORSE 400PSI 3WAY (MISCELLANEOUS) IMPLANT
SUT MNCRL AB 4-0 PS2 18 (SUTURE) ×1 IMPLANT
SUT PROLENE 5 0 C 1 24 (SUTURE) IMPLANT
SUT PROLENE 6 0 BV (SUTURE) IMPLANT
SUT PROLENE 7 0 BV 1 (SUTURE) IMPLANT
SUT SILK 3 0 SH CR/8 (SUTURE) ×1 IMPLANT
SUT VIC AB 3-0 SH 27X BRD (SUTURE) ×1 IMPLANT
TOWEL GREEN STERILE (TOWEL DISPOSABLE) ×1 IMPLANT
UNDERPAD 30X36 HEAVY ABSORB (UNDERPADS AND DIAPERS) ×1 IMPLANT

## 2024-03-26 NOTE — Anesthesia Preprocedure Evaluation (Addendum)
 Anesthesia Evaluation  Patient identified by MRN, date of birth, ID band Patient awake    Reviewed: Allergy & Precautions, NPO status , Patient's Chart, lab work & pertinent test results  Airway Mallampati: III  TM Distance: >3 FB Neck ROM: Full    Dental no notable dental hx.    Pulmonary sleep apnea    Pulmonary exam normal        Cardiovascular hypertension, Pt. on medications +CHF  Normal cardiovascular exam  ECHO: 1. Left ventricular ejection fraction, by estimation, is 55 to 60%. Left  ventricular ejection fraction by 3D volume is 55 %. The left ventricle has  normal function. The left ventricle has no regional wall motion  abnormalities. Left ventricular diastolic   parameters are consistent with Grade II diastolic dysfunction  (pseudonormalization). The average left ventricular global longitudinal  strain is -22.3 %.   2. Right ventricular systolic function is normal. The right ventricular  size is normal. There is mildly elevated pulmonary artery systolic  pressure. The estimated right ventricular systolic pressure is 41.4 mmHg.   3. The mitral valve is normal in structure. No evidence of mitral valve  regurgitation. No evidence of mitral stenosis.   4. The aortic valve is tricuspid. Aortic valve regurgitation is not  visualized. No aortic stenosis is present.   5. The inferior vena cava is normal in size with greater than 50%  respiratory variability, suggesting right atrial pressure of 3 mmHg.     Neuro/Psych  Neuromuscular disease    GI/Hepatic Neg liver ROS,GERD  Medicated and Controlled,,  Endo/Other  diabetes, Insulin  Dependent  Patient on GLP-1 Agonist  Renal/GU ESRF and DialysisRenal disease     Musculoskeletal   Abdominal  (+) + obese  Peds  Hematology negative hematology ROS (+)   Anesthesia Other Findings ESRD  Reproductive/Obstetrics                               Anesthesia Physical Anesthesia Plan  ASA: 3  Anesthesia Plan: General   Post-op Pain Management:    Induction: Intravenous  PONV Risk Score and Plan: 3 and Ondansetron , Dexamethasone , Midazolam  and Treatment may vary due to age or medical condition  Airway Management Planned: LMA  Additional Equipment:   Intra-op Plan:   Post-operative Plan: Extubation in OR  Informed Consent: I have reviewed the patients History and Physical, chart, labs and discussed the procedure including the risks, benefits and alternatives for the proposed anesthesia with the patient or authorized representative who has indicated his/her understanding and acceptance.     Dental advisory given  Plan Discussed with: CRNA  Anesthesia Plan Comments:         Anesthesia Quick Evaluation

## 2024-03-26 NOTE — H&P (Signed)
 Office Note    Patient seen and examined in preop holding.  No complaints. No changes to medication history or physical exam since last seen in clinic. Patient has history of bilateral carpal tunnel release.  Current symptoms are more similar to carpal tunnel and fistula steal as it involves the median nerve distribution.  Had a long discussion with her about this.  We discussed that since she has had bilaterally's, the only thing I could offer would be fistula banding versus ligation.  We both agreed to attempt fistula banding first.  After discussing  After discussing the risks and benefits of fistula banding for paraesthesias in the left hand, [facility] elected to proceed.   Cassandra FORBES Rim MD    CC:  Concern for steal syndrome Requesting Provider:  No ref. provider found  HPI: Cassandra Campos is a 53 y.o. (1971-03-27) female presenting at the request of .Cassandra Medici, MD with concern for steal syndrome.  Cassandra Campos is well-known to the [facility] service having previously undergone access procedures from multiple [facility] physicians.  She is currently being dialyzed through a left arm brachiocephalic fistula which has undergone multiple revisions.  On exam today, Cassandra Campos was doing well.  A native of [place], she graduated from [employer].  She was unemployed during dialysis, but now that she has received a kidney, is back to work, working in [employer] at the [employer].  Since starting work, she is appreciated numbness and tingling which occurs in the left hand with significant use.  When she straightens out her arm and places it in the dependent position, this improves. Denies wounds, denies ischemic rest pain.  She would like to keep her fistula if possible as it continues to have a nice thrill, and understands that her kidney will not last forever.  Surgical history includes bilateral carpal tunnel release.    Past Medical History:  Diagnosis  Date   Anemia associated with chronic renal failure    blood transfusion 01/01/22 in [place]   Arthritis    ASCUS of cervix with negative high risk HPV 06/2017   Back pain    hx - resolved per pt 03/22/24   Chest pain    hx - reolved per pt 03/22/24   CHF (congestive heart failure) (HCC)    CKD (chronic kidney disease), stage IV (HCC) no dialysis yet   nephrologist-  dr montie dakins-- scheduled next visit 09-08-2017 (lov 07-05-2017)   Constipation    hx   Diabetic retinopathy (HCC)    Dialysis patient    in past prior to transplant in 2023 - dialysis x 4 yrs   Edema, lower extremity    hx   Elevated transaminase level    Family history of breast cancer    Family history of ovarian cancer    Family history of stomach cancer    Fatty liver    FOLLOWED BY DR ABRAN   GERD (gastroesophageal reflux disease)    Hypertension    Idiopathic gout of multiple sites    09-05-2017 per pt stable   Insulin  dependent type 2 diabetes mellitus (HCC)    followed by dr vincente   Joint pain    occasional   Kidney transplant recipient 12/17/2021   [facility]   Mixed hyperlipidemia    OSA (obstructive sleep apnea)    per study 10-20-2015 mild osa w/ AHI 10.3/hr;  09-05-2017 per pt has not used cpap since 1 yr ago   Palpitations  hx   Peripheral neuropathy    feet   Pneumonia    x 1   Pure hypercholesterolemia 05/21/2021   S/P arteriovenous (AV) fistula creation 07-04-2015  w/ revision 02-14-2017   left braniocephilic   Shortness of breath    hx prior to transplant, no current problems with SOB   Sleep apnea    Trigger thumb, right thumb    Umbilical hernia    Vitamin D deficiency     Past Surgical History:  Procedure Laterality Date   A/V FISTULAGRAM N/A 04/01/2021   Procedure: A/V FISTULAGRAM;  Surgeon: Lanis Cassandra BRAVO, MD;  Location: [facility] INVASIVE CV LAB;  Service: Cardiovascular;  Laterality: N/A;   ABDOMINAL HYSTERECTOMY  07/11/2000   dr gott   TAH/BSO for irregular bleeding  and leiomyoma   AV FISTULA PLACEMENT Left 07/04/2015   Procedure: ARTERIOVENOUS (AV) FISTULA CREATION-LEFT;  Surgeon: Lynwood JONETTA Collum, MD;  Location: [facility] OR;  Service: Vascular;  Laterality: Left;   BREAST BIOPSY Right    benign   CARPAL TUNNEL RELEASE Bilateral 1993 and 1994   CESAREAN SECTION  1993:  09-25-1999   BILATERAL TUBAL LIGATION W/ LAST C/S   DILATION AND CURETTAGE OF UTERUS     EXPLORATORY LAPARTOMY / LYSIS ADHESIONS/  BILATERAL SALPINGOOPHORECTOMY  07-20-2011  dr s. vonzell  [facility]   EYE SURGERY  09-2012   FISTULA SUPERFICIALIZATION Left 08/28/2015   Procedure: BRACHIOCEPHALIC FISTULA SUPERFICIALIZATION;  Surgeon: Lynwood JONETTA Collum, MD;  Location: [facility] OR;  Service: Vascular;  Laterality: Left;   KIDNEY TRANSPLANT  12/17/2021   PATCH ANGIOPLASTY Left 02/14/2017   Procedure: PATCH ANGIOPLASTY;  Surgeon: Harvey Carlin BRAVO, MD;  Location: [facility] OR;  Service: Vascular;  Laterality: Left;   PELVIC LAPAROSCOPY  1994   exploration for ectopic preg.   PERIPHERAL VASCULAR BALLOON ANGIOPLASTY  04/01/2021   Procedure: PERIPHERAL VASCULAR BALLOON ANGIOPLASTY;  Surgeon: Lanis Cassandra BRAVO, MD;  [facility] INVASIVE CV LAB;  Service: Cardiovascular;;   RETINAL DETACHMENT SURGERY Left 2015   REVISON OF ARTERIOVENOUS FISTULA Left 02/14/2017   Procedure: REVISON OF ARTERIOVENOUS FISTULA  LEFT ARM;  Surgeon: Harvey Carlin BRAVO, MD;  Location: [facility] OR;  Service: Vascular;  Laterality: Left;   REVISON OF ARTERIOVENOUS FISTULA Left 07/24/2021   Procedure: REVISON OF ARTERIOVENOUS FISTULA ARM;  Surgeon: Eliza Lonni RAMAN, MD;  Location: [facility] OR;  Service: Vascular;  Laterality: Left;   TRIGGER FINGER RELEASE Left 2015   thumb, also done in 2022   TRIGGER FINGER RELEASE Right 09/09/2017   Procedure: RELEASE TRIGGER FINGER/A-1 PULLEY RIGHT THUMB;  Surgeon: Yvone Rush, MD;  Location: [facility];  Service: Orthopedics;  Laterality: Right;   TUBAL LIGATION  09-1999    Social History    Socioeconomic History   Marital status: Married    Spouse name: Not on file   Number of children: 2   Years of education: Not on file   Highest education level: Some college, no degree  Occupational History   Occupation: medical billing and insurance  Tobacco Use   Smoking status: Never    Passive exposure: Current   Smokeless tobacco: Never  Vaping Use   Vaping status: Never Used  Substance and Sexual Activity   Alcohol use: Yes    Comment: socially   Drug use: No   Sexual activity: Yes    Partners: Male    Birth control/protection: Surgical    Comment: HYSTERECTOMY-1st intercourse 53 yo-Fewer than 5 partners  Other Topics Concern   Not  on file  Social History Narrative   Not on file   Social Drivers of Health   Financial Resource Strain: Low Risk  (05/19/2023)   Overall Financial Resource Strain (CARDIA)    Difficulty of Paying Living Expenses: Not very hard  Food Insecurity: No Food Insecurity (01/25/2024)   Hunger Vital Sign    Worried About Running Out of Food in the Last Year: Never true    Ran Out of Food in the Last Year: Never true  Transportation Needs: No Transportation Needs (01/25/2024)   PRAPARE - Administrator, Civil Service (Medical): No    Lack of Transportation (Non-Medical): No  Physical Activity: Insufficiently Active (12/01/2023)   Exercise Vital Sign    Days of Exercise per Week: 3 days    Minutes of Exercise per Session: 20 min  Stress: No Stress Concern Present (12/01/2023)   [employer] of Occupational Health - Occupational Stress Questionnaire    Feeling of Stress: Not at all  Social Connections: Socially Integrated (12/01/2023)   Social Connection and Isolation Panel    Frequency of Communication with Friends and Family: More than three times a week    Frequency of Social Gatherings with Friends and Family: More than three times a week    Attends Religious Services: 1 to 4 times per year    Active Member of [employer] or  Organizations: Yes    Attends Engineer, Structural: More than 4 times per year    Marital Status: Married  Catering Manager Violence: Not At Risk (01/25/2024)   Humiliation, Afraid, Rape, and Kick questionnaire    Fear of Current or Ex-Partner: No    Emotionally Abused: No    Physically Abused: No    Sexually Abused: No   Family History  Problem Relation Age of Onset   Diabetes Mother    Hypertension Mother    Thyroid  disease Mother    Ovarian cancer Mother        dx 83s   Diabetes Father    Hypertension Father    Cancer Father        STOMACH   Stomach cancer Father 53   Breast cancer Maternal Aunt 65   Cancer Maternal Aunt    Cancer Paternal Uncle        unknown type, dx >50   Stroke Maternal Grandmother    Stroke Maternal Grandfather    Stroke Paternal Grandmother    Cancer Paternal Grandfather        unknown type, mets to brain   Cancer Cousin        unknown type, dx 66s, paternal first cousin   Cancer Other        unknown type, father's first cousin   Cancer Other        unknown types, 4 of mother's first cousins    Current Facility-Administered Medications  Medication Dose Route Frequency Provider Last Rate Last Admin   0.9 %  sodium chloride  infusion   Intravenous Continuous Natisha Trzcinski E, MD       ceFAZolin  (ANCEF ) 2-4 GM/100ML-% IVPB            ceFAZolin  (ANCEF ) IVPB 2g/100 mL premix  2 g Intravenous 30 min Pre-Op  Amaryah Mallen E, MD       chlorhexidine  (HIBICLENS ) 4 % liquid 4 Application  60 mL Topical Once Chelise Hanger E, MD       And   chlorhexidine  (HIBICLENS ) 4 % liquid 4 Application  60 mL Topical Once  Lanis Cassandra BRAVO, MD        No Known Allergies    REVIEW OF SYSTEMS:  [X]  denotes positive finding, [ ]  denotes negative finding Cardiac  Comments:  Chest pain or chest pressure:    Shortness of breath upon exertion:    Short of breath when lying flat:    Irregular heart rhythm:        Vascular    Pain in calf, thigh, or hip  brought on by ambulation:    Pain in feet at night that wakes you up from your sleep:     Blood clot in your veins:    Leg swelling:         Pulmonary    Oxygen at home:    Productive cough:     Wheezing:         Neurologic    Sudden weakness in arms or legs:     Sudden numbness in arms or legs:     Sudden onset of difficulty speaking or slurred speech:    Temporary loss of vision in one eye:     Problems with dizziness:         Gastrointestinal    Blood in stool:     Vomited blood:         Genitourinary    Burning when urinating:     Blood in urine:        Psychiatric    Major depression:         Hematologic    Bleeding problems:    Problems with blood clotting too easily:        Skin    Rashes or ulcers:        Constitutional    Fever or chills:      PHYSICAL EXAMINATION:  Vitals:   03/22/24 1812 03/26/24 0732  BP:  (!) 147/76  Pulse:  86  Resp:  18  Temp:  98.4 F (36.9 C)  TempSrc:  Oral  SpO2:  99%  Weight: 95.4 kg 96.6 kg  Height: 5' 2 (1.575 m) 5' 2 (1.575 m)    General:  WDWN in NAD; vital signs documented above Gait: Not observed HENT: WNL, normocephalic Pulmonary: normal non-labored breathing , without wheezing Cardiac: regular HR Abdomen: soft, NT, no masses Skin: without rashes Vascular Exam/Pulses:  Right Left  Radial 2+ (normal) 1+ (weak)  Ulnar    Femoral    Popliteal    DP    PT     Extremities: without ischemic changes, without Gangrene , without cellulitis; without open wounds;  Musculoskeletal: no muscle wasting or atrophy  Neurologic: A&O X 3;  No focal weakness or paresthesias are detected Psychiatric:  The pt has Normal affect.   Non-Invasive Vascular Imaging:     indings:  +-----------------+-------------+----------+---------+----------+----------  ----+  Upper Arm        Diameter (cm)Depth (cm)BranchingPSV (cm/s) Flow  Volume    Arteriovenous                                                   (ml/min)     Fistula                                                                     +-----------------+-------------+----------+---------+----------+----------  ----+  Distal Brachial                                     212         2444        Artery                                                                      +-----------------+-------------+----------+---------+----------+----------  ----+  AVF anastomosis                                     578                     +-----------------+-------------+----------+---------+----------+----------  ----+  outflow vein                                         38                     distal upper arm                                                            +-----------------+-------------+----------+---------+----------+----------  ----+      +---------------------------+-----+-------+--------+                            RightLeft   Comments  +---------------------------+-----+-------+--------+  Brachial                                        +---------------------------+-----+-------+--------+  Radial Ambient                  78 mmHg          +---------------------------+-----+-------+--------+  Radial AV Compression           99 mmHg          +---------------------------+-----+-------+--------+  Ulnar Ambient                                    +---------------------------+-----+-------+--------+  Ulnar AV Compression                             +---------------------------+-----+-------+--------+  2nd Digit Ambient                                +---------------------------+-----+-------+--------+  2nd Digit Ulnar Compression                      +---------------------------+-----+-------+--------+     ASSESSMENT/PLAN: Cassandra  Juliana Campos is a 53 y.o. female presenting with signs and symptoms of mild steal syndrome.  I am  happy that she does not have ischemic rest pain or tissue loss, but I want her to continue to be able to work.   Although the left fistula duplex demonstrates only a small amount of change with compression, I think any improvement in digit pressure may help to quell her symptoms.  With a functioning kidney, I would not pursue upper extremity arterial stenting, therefore I do not think left upper extremity angiogram is necessary.  We discussed ligation versus banding to either cease, or decreased flow respectively.  After discussing the risks and benefits of both, Cassandra Campos to attempt fistula banding prior to abandoning the fistula.  She is aware that banding can cause fistula thrombosis.   We discussed that if surgery does not significantly improve her symptoms, I would have her see hand surgery to ensure there is not a neurogenic component prior to moving to ligation, however with history of bilateral carpal tunnel release, I think that this is low yield.    Cassandra FORBES Rim, MD [facility] [phone]

## 2024-03-26 NOTE — Anesthesia Procedure Notes (Signed)
 Procedure Name: LMA Insertion Date/Time: 03/26/2024 9:38 AM  Performed by: Lockie Flesher, CRNAPre-anesthesia Checklist: Patient identified, Emergency Drugs available, Suction available and Patient being monitored Patient Re-evaluated:Patient Re-evaluated prior to induction Oxygen Delivery Method: Circle System Utilized Preoxygenation: Pre-oxygenation with 100% oxygen Induction Type: IV induction Ventilation: Mask ventilation without difficulty LMA: LMA inserted LMA Size: 4.0 Number of attempts: 1 Airway Equipment and Method: Bite block Placement Confirmation: positive ETCO2 Tube secured with: Tape Dental Injury: Teeth and Oropharynx as per pre-operative assessment

## 2024-03-26 NOTE — Op Note (Addendum)
    NAME: Cassandra Campos    MRN: [id number] DOB: [birth year]    DATE OF OPERATION: 03/26/2024  PREOP DIAGNOSIS:    Left upper extremity steal syndrome  POSTOP DIAGNOSIS:    Same  PROCEDURE:    Left upper extremity fistula revision - aneurysmorrhaphy.  SURGEON: Fonda FORBES Rim  ASSIST: Donnice Sender, PA  ANESTHESIA: General  EBL: 5 mL  INDICATIONS:    Cassandra Campos is a 53 y.o. female with prior history of end-stage renal disease, now with transplant.  She also has history of bilateral carpal tunnel release.  She presented to my office with numbness and tingling in the left arm, with certain arm movements.  The numbness and tingling is not omnipresent.  She had a weak pulse at the wrist.  AV fistula demonstrated flow of 2.4l per minute.  She is aware symptoms are atypical.  Symptoms appear to be most associated with the median nerve, but with bilateral carpal tunnel release, I think it is reasonable to attempt fistula banding in an effort to lower flow to see if this improves hand symptoms.  I discussed the risk benefits and Cassandra Campos, Cassandra Campos elected to proceed.  FINDINGS:   Large, aneurysmal left arm radiocephalic fistula.  Aneurysm was oversewn leaving 4 mm of usable vein, much like a banding procedure.  TECHNIQUE:   Patient is brought to the OR laid in supine position.  Anesthesia was established preoperative sterile fashion.  The case again with ultrasound insonation of the left arm brachiocephalic fistula.  Longitudinal incision was made along the fistula immediately cephalad to the antecubital fossa and taken down to the fistula.  I used a fistula clamp to narrow the flow lumen to roughly 4 mm in size.  Distally, there.  To be a weak thrill, and the pulse in the hand improved dramatically.  I did not think that using standard silk suture around a 4 mm balloon would be sufficient due to the aneurysmal nature of the fistula, therefore I elected to use 5-0  Prolene, and oversew the aneurysm with roughly 4 mm worth of usable fistula below.  Total length of aneurysmorrhaphy was 4 cm.  The aneurysm was oversewn in diamondback fashion, and upon clamp release, the patient had an excellent pulse in the left wrist, and a light thrill in the fistula.  I was happy with this result.  The wound bed was irrigated with copious amounts saline and closed in layers using 3-0 Vicryl with Monocryl and Dermabond at the level of the skin.  Given the complexity of the case,  the assistant was necessary in order to expedient the procedure and safely perform the technical aspects of the operation.  The assistant provided traction and countertraction to assist with exposure of the fistula and aided in the aneurysm plication.  These skills, especially following the Prolene suture for the anastomosis, could not have been adequately performed by a scrub tech assistant.    Fonda FORBES Rim, MD [facility] DATE OF DICTATION:   03/26/2024

## 2024-03-26 NOTE — Progress Notes (Signed)
 Revised 03/26/2024 Dr. Lanis

## 2024-03-26 NOTE — Discharge Instructions (Signed)

## 2024-03-26 NOTE — Transfer of Care (Signed)
 Immediate Anesthesia Transfer of Care Note  Patient: [facility]  Procedure(s) Performed: REVISON OF ARTERIOVENOUS FISTULA (Left)  Patient Location: PACU  Anesthesia Type:General  Level of Consciousness: awake, alert , and oriented  Airway & Oxygen Therapy: Patient Spontanous Breathing  Post-op Assessment: Report given to RN and Post -op Vital signs reviewed and stable  Post vital signs: Reviewed and stable  Last Vitals:  Vitals Value Taken Time  BP 131/72 03/26/24 10:39  Temp    Pulse 96 03/26/24 10:39  Resp 16 03/26/24 10:41  SpO2 96 % 03/26/24 10:39  Vitals shown include unfiled device data.  Last Pain:  Vitals:   03/26/24 0755  TempSrc:   PainSc: 0-No pain         Complications: No notable events documented.

## 2024-03-27 ENCOUNTER — Encounter (HOSPITAL_COMMUNITY): Payer: Self-pay | Admitting: Vascular Surgery

## 2024-03-27 NOTE — Anesthesia Postprocedure Evaluation (Signed)
 Anesthesia Post Note  Patient: Forensic Scientist  Procedure(s) Performed: REVISON OF ARTERIOVENOUS FISTULA (Left)     Patient location during evaluation: PACU Anesthesia Type: General Level of consciousness: awake Pain management: pain level controlled Vital Signs Assessment: post-procedure vital signs reviewed and stable Respiratory status: spontaneous breathing, nonlabored ventilation and respiratory function stable Cardiovascular status: blood pressure returned to baseline and stable Postop Assessment: no apparent nausea or vomiting Anesthetic complications: no   No notable events documented.  Last Vitals:  Vitals:   03/26/24 1100 03/26/24 1115  BP: 138/75 129/79  Pulse: 85 87  Resp: 12 16  Temp:  36.4 C  SpO2: 96% 96%    Last Pain:  Vitals:   03/26/24 1100  TempSrc:   PainSc: 0-No pain                 Tanika Bracco P Andrae Claunch

## 2024-03-30 ENCOUNTER — Telehealth: Payer: Self-pay

## 2024-03-30 NOTE — Telephone Encounter (Addendum)
 Patient called reporting left hand numbness s/p AVF revision on 11/10.    Deferred to Dr. Lanis via Staff Message - awaiting reply.  Consulted Cassandra Campos, [place] about patient's hand numbness.  She advised that the symptom the patient is having doesn't sound like steal syndrome and is not limb threatening.  Referral made to Hand Surgeon for possibility of neurogenic cause of numbness.    Patient knows to call back or go to the ED if her hand becomes cold, painful, weak.

## 2024-03-30 NOTE — Telephone Encounter (Signed)
 Returned pt's call to let her know we have not yet received anything from [employer] for her disability. She stated she would reach out to them again today and that she was told she can't return to work until she is without any restrictions.

## 2024-04-09 ENCOUNTER — Telehealth
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
## 2024-04-16 ENCOUNTER — Ambulatory Visit: Admitting: Orthopedic Surgery

## 2024-04-16 DIAGNOSIS — M79642 Pain in left hand: Secondary | ICD-10-CM | POA: Diagnosis not present

## 2024-04-26 ENCOUNTER — Encounter: Payer: Self-pay | Admitting: Vascular Surgery

## 2024-04-26 ENCOUNTER — Encounter: Payer: Self-pay | Admitting: Physician Assistant

## 2024-04-26 ENCOUNTER — Ambulatory Visit: Attending: Surgery

## 2024-04-26 VITALS — BP 145/84 | HR 94 | Temp 97.7°F | Wt 225.3 lb

## 2024-04-26 DIAGNOSIS — T82898A Other specified complication of vascular prosthetic devices, implants and grafts, initial encounter: Secondary | ICD-10-CM

## 2024-04-26 DIAGNOSIS — R200 Anesthesia of skin: Secondary | ICD-10-CM

## 2024-04-26 NOTE — Progress Notes (Signed)
 POST OPERATIVE OFFICE NOTE    CC:  F/u for surgery  HPI:  This is a 53 y.o. female who is s/p left upper extremity fistula revision with narrowing of the fistula near the arterial anastomosis due to numbness in the left hand.  This was performed by Dr. Lanis on 03/26/2024.  She has history of bilateral carpal tunnel release.  She complains of numbness in the entire left hand especially with certain movements usually involving her job at the [employer].  She does not have any symptoms when she is at rest or when her arm is down on her side.  She has not noticed any changes since having her surgery.  She is scheduled for nerve conduction studies in the left arm on 05/09/2024.  She has history of a kidney transplant and has not required dialysis over the last 2 years.  Allergies[1]  Current Outpatient Medications  Medication Sig Dispense Refill   Acetaminophen  325 MG CAPS Take 650 mg by mouth. (Patient not taking: Reported on 03/22/2024)     albuterol  (VENTOLIN  HFA) 108 (90 Base) MCG/ACT inhaler Inhale 2 puffs into the lungs every 6 (six) hours as needed for wheezing or shortness of breath. 8 g 2   allopurinol  (ZYLOPRIM ) 100 MG tablet TAKE 1 TABLET BY MOUTH EVERY DAY 30 tablet 8   aspirin EC 81 MG tablet Take 1 tablet by mouth daily.     Cholecalciferol (VITAMIN D-3 PO) Take 1 each by mouth daily at 12 noon. gummy     Continuous Glucose Sensor (DEXCOM G7 SENSOR) MISC 1 % by Does not apply route continuous. 1 each, Every 14 days     furosemide  (LASIX ) 80 MG tablet Take 80 mg by mouth 2 (two) times daily.     HYDROcodone -acetaminophen  (NORCO/VICODIN) 5-325 MG tablet Take 1 tablet by mouth every 6 (six) hours as needed for moderate pain (pain score 4-6). 10 tablet 0   Insulin  Disposable Pump (OMNIPOD 5 G7 PODS, GEN 5,) MISC 1 each by Does not apply route every other day. 15 each 11   Insulin  Lispro-aabc (LYUMJEV  KWIKPEN) 200 UNIT/ML KwikPen Use up to 100 units a day in the insulin  pump. We need  the U200 concentration.  DAW1. 45 mL 3   Insulin  Pen Needle 32G X 4 MM MISC Use 2x a day 200 each 3   Insulin  Syringe/Needle U-500 (BD INSULIN  SYRINGE U-500) 31G X 0.5 ML MISC Use to inject insulin  3 times a day 300 each 5   methenamine (HIPREX) 1 g tablet Take 1 g by mouth daily.     Microlet Lancets MISC Use as directed to check blood sugars 4 times per day dx: e11.22 300 each 2   mycophenolate (MYFORTIC) 180 MG EC tablet Take 360 mg by mouth 2 (two) times daily. Patient is taking 2 tablets twice daily     NIFEdipine  (PROCARDIA -XL/NIFEDICAL-XL) 30 MG 24 hr tablet Take 2 tablets by mouth in the morning and one tablet by mouth In the evening. 270 tablet 0   omeprazole  (PRILOSEC) 20 MG capsule Take 1 capsule (20 mg total) by mouth daily. 90 capsule 3   predniSONE  (DELTASONE ) 5 MG tablet Take 5 mg by mouth daily with breakfast.     pregabalin  (LYRICA ) 150 MG capsule TAKE 1 CAPSULE BY MOUTH EVERY DAY 90 capsule 1   rosuvastatin  (CRESTOR ) 20 MG tablet Take 1 tablet (20 mg total) by mouth daily. (Patient taking differently: Take 20 mg by mouth at bedtime.) 90 tablet 3  Semaglutide , 2 MG/DOSE, (OZEMPIC , 2 MG/DOSE,) 8 MG/3ML SOPN Inject 2 mg into the skin once a week. (Patient taking differently: Inject 2 mg into the skin once a week. Takes on Sunday) 9 mL 3   sulfamethoxazole -trimethoprim  (BACTRIM ) 400-80 MG tablet Take 1 tablet by mouth 3 (three) times a week. Monday, Wednesday, Friday     No current facility-administered medications for this visit.     ROS:  See HPI  Physical Exam:  Vitals:   04/26/24 0950  BP: (!) 145/84  Pulse: 94  Temp: 97.7 F (36.5 C)  TempSrc: Temporal  Weight: 225 lb 4.8 oz (102.2 kg)    Incision: Left arm incision well-healed Extremities: 2+ palpable left radial pulse  Assessment/Plan:  This is a 53 y.o. female who is s/p: Revision of left arm fistula with narrowing of the fistula near the arterial anastomosis  Palpable flow through the fistula in the  left upper arm.  The incision has completely healed.  She now has a palpable left radial pulse which is substantially better than preoperatively.  Despite an improvement in perfusion to the hand, she does not notice any change in her symptoms.  This suggests the numbness in the hand is unrelated to her fistula or steal syndrome.  She is scheduled to have nerve conduction studies later this month on 05/09/2024.  She was provided a work note pending these results.  We briefly discussed excision of the entire left arm fistula which can be an extensive surgery with risk of nonhealing surgical wounds, nerve damage, and the risk that this also does not improve her symptoms.  For now she will follow-up on an as-needed basis.  If nerve conduction studies are negative and redo carpal tunnel release is not warranted, she can follow-up with Dr. Lanis to discuss fistula excision however this should only be performed after everything else is excluded.   Donnice Sender, PA-C [facility] [phone]  Clinic MD:  Lanis     [1] No Known Allergies

## 2024-04-30 ENCOUNTER — Telehealth

## 2024-05-02 ENCOUNTER — Ambulatory Visit: Admitting: Nurse Practitioner

## 2024-05-02 ENCOUNTER — Other Ambulatory Visit (HOSPITAL_COMMUNITY)
Admission: RE | Admit: 2024-05-02 | Discharge: 2024-05-02 | Disposition: A | Source: Ambulatory Visit | Attending: Nurse Practitioner | Admitting: Nurse Practitioner

## 2024-05-02 ENCOUNTER — Encounter: Payer: Self-pay | Admitting: Nurse Practitioner

## 2024-05-02 VITALS — BP 120/68 | HR 70 | Resp 16

## 2024-05-02 DIAGNOSIS — R87622 Low grade squamous intraepithelial lesion on cytologic smear of vagina (LGSIL): Secondary | ICD-10-CM | POA: Diagnosis present

## 2024-05-02 DIAGNOSIS — R8789 Other abnormal findings in specimens from female genital organs: Secondary | ICD-10-CM

## 2024-05-02 NOTE — Progress Notes (Signed)
° °   Patient ID: Cassandra Campos, female    DOB: [birth year], 53 y.o.   MRN: [id number]  Colposcopy Procedure Note Navpreet Szczygiel [facility] 05/02/2024  Indications: 02/23/2024 LSGIL + HR HPV (vaginal pap). See pap history below.   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 Timeout performed.  Speculum placed in vagina and excellent visualization of vaginal cuff achieved, vaginal walls swabbed x 3 with acetic acid solution.  White light and green light filter used.  Dr. Dallie present for observation.   Impression: CIN-1  Satisfactory (ECC zone seen): N/A  Findings:  Vaginal colposcopy biopsy taken: 8 O'clock for punctation  Hemostasis obtained with application of Monsel's Solution  Complications:    Patient tolerated the procedure well.  Plan: Will notify patient of results and plan of care.   Annabella DELENA Shutter DNP, 11:35 AM 05/02/2024

## 2024-05-04 ENCOUNTER — Other Ambulatory Visit (HOSPITAL_COMMUNITY): Payer: Self-pay

## 2024-05-05 LAB — SURGICAL PATHOLOGY

## 2024-05-07 ENCOUNTER — Other Ambulatory Visit (HOSPITAL_COMMUNITY): Payer: Self-pay

## 2024-05-07 ENCOUNTER — Ambulatory Visit: Payer: Self-pay | Admitting: Radiology

## 2024-05-08 ENCOUNTER — Encounter: Payer: Self-pay | Admitting: Internal Medicine

## 2024-05-08 ENCOUNTER — Other Ambulatory Visit (HOSPITAL_COMMUNITY): Payer: Self-pay

## 2024-05-08 ENCOUNTER — Telehealth: Payer: Self-pay

## 2024-05-08 MED ORDER — OMNIPOD 5 G7 PODS (GEN 5) MISC: 1.0000 | 11 refills | Status: AC

## 2024-05-08 MED ORDER — OZEMPIC (2 MG/DOSE) 8 MG/3ML ~~LOC~~ SOPN: 2.0000 mg | PEN_INJECTOR | SUBCUTANEOUS | 3 refills | Status: DC

## 2024-05-08 NOTE — Telephone Encounter (Signed)
 Pt needs Urgent PA for Omnipods and a PA for Ozempic .

## 2024-05-08 NOTE — Telephone Encounter (Signed)
 Pharmacy Patient Advocate Encounter   Received notification from Pt Calls Messages that prior authorization for Ozempic  (2 MG/DOSE) 8MG /3ML pen-injectors is required/requested.   Insurance verification completed.   The patient is insured through [employer] MEDICARE.   Per test claim: The current 30 day co-pay is, $0.  No PA needed at this time. This test claim was processed through [facility]- copay amounts may vary at other pharmacies due to pharmacy/plan contracts, or as the patient moves through the different stages of their insurance plan.

## 2024-05-09 ENCOUNTER — Telehealth: Payer: Self-pay | Admitting: *Deleted

## 2024-05-09 ENCOUNTER — Telehealth: Payer: Self-pay

## 2024-05-09 ENCOUNTER — Other Ambulatory Visit (HOSPITAL_COMMUNITY): Payer: Self-pay

## 2024-05-09 ENCOUNTER — Encounter: Admitting: Physical Medicine and Rehabilitation
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
## 2024-05-09 NOTE — Telephone Encounter (Signed)
 Pharmacy Patient [facility] Encounter   Received notification from Pt Calls Messages that prior authorization for Omnipod 5 [id number] Pods Gen 5 is required/requested.   Insurance verification completed.   The patient is insured through [facility] MEDIC[employer].   Per test claim: Refill too soon. PA is not needed at this time. Medication was filled 05/08/2024. Next eligible fill date is 05/30/2024.

## 2024-05-09 NOTE — Telephone Encounter (Signed)
 Patient called to let us  know her Nerve conduction study was cancelled due to mechanical problem with machine. She is asking about going back to work. She states she cannot go back with restrictions. I did refer her to contact ortho since they are the ordering office for her study for possible carpel tunnel. She states Matt our [place] wrote a note for her to be out through today and ortho told her they dont write people out of work. Patient requesting we ask Dr Lanis about work return. I let patient know we will check with him next week when he is back in the office. I will have Inocente follow up on this.

## 2024-05-11 ENCOUNTER — Other Ambulatory Visit (HOSPITAL_COMMUNITY): Payer: Self-pay

## 2024-05-11 MED ORDER — OZEMPIC (2 MG/DOSE) 8 MG/3ML ~~LOC~~ SOPN
2.0000 mg | PEN_INJECTOR | SUBCUTANEOUS | 3 refills | Status: AC
  Filled 2024-05-11: qty 9, 84d supply, fill #0

## 2024-05-11 NOTE — Addendum Note (Signed)
 Addended by: CLEOTILDE ROLIN RAMAN on: 05/11/2024 03:52 PM   Modules accepted: Orders

## 2024-05-14 ENCOUNTER — Telehealth: Payer: Self-pay | Admitting: Orthopedic Surgery

## 2024-05-14 ENCOUNTER — Other Ambulatory Visit (HOSPITAL_COMMUNITY): Payer: Self-pay

## 2024-05-14 ENCOUNTER — Other Ambulatory Visit: Payer: Self-pay

## 2024-05-14 DIAGNOSIS — M79642 Pain in left hand: Secondary | ICD-10-CM

## 2024-05-14 NOTE — Telephone Encounter (Signed)
"  Placed.  "

## 2024-05-14 NOTE — Telephone Encounter (Signed)
 Patient called. She would like to know if she could be sent somewhere else for the nerve study?

## 2024-05-15 ENCOUNTER — Other Ambulatory Visit: Payer: Self-pay | Admitting: Family Medicine

## 2024-05-15 DIAGNOSIS — Z760 Encounter for issue of repeat prescription: Secondary | ICD-10-CM

## 2024-05-15 DIAGNOSIS — E1169 Type 2 diabetes mellitus with other specified complication: Secondary | ICD-10-CM

## 2024-05-16 ENCOUNTER — Telehealth: Payer: Self-pay

## 2024-05-16 NOTE — Telephone Encounter (Signed)
 Pt was unable to have nerve study done due to orthopedic office having to r/s it for next week. This has not been scheduled yet due to them waiting on a part for the machine. Pt is aware this will likely be r/s for early January. Pt was given an out of work note until early January, pending test results. [employer] faxed request for 12/30-12/31/25 medical office notes. Pt has not been seen during this time period. I have called Harlene POUR at [facility] and left VM with this information and have faxed them a note stating this as well.

## 2024-05-18 ENCOUNTER — Encounter: Payer: Self-pay | Admitting: Nurse Practitioner

## 2024-05-18 ENCOUNTER — Telehealth: Admitting: Nurse Practitioner

## 2024-05-18 ENCOUNTER — Other Ambulatory Visit (HOSPITAL_COMMUNITY): Payer: Self-pay

## 2024-05-18 DIAGNOSIS — N870 Mild cervical dysplasia: Secondary | ICD-10-CM

## 2024-05-18 DIAGNOSIS — N760 Acute vaginitis: Secondary | ICD-10-CM | POA: Diagnosis not present

## 2024-05-18 MED ORDER — METRONIDAZOLE 500 MG PO TABS
500.0000 mg | ORAL_TABLET | Freq: Two times a day (BID) | ORAL | 0 refills | Status: AC
  Filled 2024-05-18: qty 14, 7d supply, fill #0

## 2024-05-18 NOTE — Progress Notes (Signed)
" ° °  Virtual Visit via Video Note  I connected with Cassandra Campos on 05/18/2024 at  9:30 AM EST by a video enabled telemedicine application and verified that I am speaking with the correct person using two identifiers.  Location: Patient: Home Provider: Office   I discussed the limitations of evaluation and management by telemedicine and the availability of in person appointments. The patient expressed understanding and agreed to proceed.  History of Present Illness: Presents virtually to discuss colposcopy results 05/02/24 CIN-1. Also having fishy odor after intercourse. Denies itching.   Review of Systems  Constitutional: Negative.   Genitourinary:        Vaginal odor     Observations/Objective: Physical Exam Constitutional:      Appearance: Normal appearance.   GU: Unable to assess due to nature of virtual visit   Assessment and Plan:  Dysplasia of cervix, low grade (CIN 1) - Discussed colposcopy results of mild dysplasia and current recommendations. All questions answered.   Acute vaginitis - Plan: metroNIDAZOLE  (FLAGYL ) 500 MG tablet BID x 7 days.   Follow Up Instructions:  I discussed the assessment and treatment plan with the patient. The patient was provided an opportunity to ask questions and all were answered. The patient agreed with the plan and demonstrated an understanding of the instructions.   The patient was advised to call back or seek an in-person evaluation if the symptoms worsen or if the condition fails to improve as anticipated.  I provided 20 minutes of non-face-to-face time during this encounter.   Annabella DELENA Shutter, NP  "

## 2024-05-21 ENCOUNTER — Ambulatory Visit: Payer: Self-pay | Admitting: Internal Medicine

## 2024-05-21 ENCOUNTER — Ambulatory Visit (INDEPENDENT_AMBULATORY_CARE_PROVIDER_SITE_OTHER): Payer: Self-pay | Admitting: Internal Medicine

## 2024-05-21 ENCOUNTER — Other Ambulatory Visit: Payer: Self-pay | Admitting: Internal Medicine

## 2024-05-21 ENCOUNTER — Encounter: Payer: Self-pay | Admitting: Internal Medicine

## 2024-05-21 VITALS — BP 132/70 | HR 98 | Temp 98.3°F | Ht 62.0 in | Wt 224.8 lb

## 2024-05-21 DIAGNOSIS — I130 Hypertensive heart and chronic kidney disease with heart failure and stage 1 through stage 4 chronic kidney disease, or unspecified chronic kidney disease: Secondary | ICD-10-CM

## 2024-05-21 DIAGNOSIS — R202 Paresthesia of skin: Secondary | ICD-10-CM

## 2024-05-21 DIAGNOSIS — N184 Chronic kidney disease, stage 4 (severe): Secondary | ICD-10-CM

## 2024-05-21 DIAGNOSIS — Z0000 Encounter for general adult medical examination without abnormal findings: Secondary | ICD-10-CM

## 2024-05-21 DIAGNOSIS — Z940 Kidney transplant status: Secondary | ICD-10-CM | POA: Diagnosis not present

## 2024-05-21 DIAGNOSIS — E1169 Type 2 diabetes mellitus with other specified complication: Secondary | ICD-10-CM

## 2024-05-21 DIAGNOSIS — I5032 Chronic diastolic (congestive) heart failure: Secondary | ICD-10-CM

## 2024-05-21 DIAGNOSIS — E66813 Obesity, class 3: Secondary | ICD-10-CM

## 2024-05-21 DIAGNOSIS — I150 Renovascular hypertension: Secondary | ICD-10-CM

## 2024-05-21 DIAGNOSIS — E785 Hyperlipidemia, unspecified: Secondary | ICD-10-CM

## 2024-05-21 LAB — POCT URINALYSIS DIPSTICK
Bilirubin, UA: NEGATIVE
Glucose, UA: NEGATIVE
Ketones, UA: NEGATIVE
Leukocytes, UA: NEGATIVE
Nitrite, UA: NEGATIVE
Protein, UA: NEGATIVE
Spec Grav, UA: 1.02
Urobilinogen, UA: 0.2 U/dL
pH, UA: 5.5

## 2024-05-21 NOTE — Patient Instructions (Signed)

## 2024-05-21 NOTE — Progress Notes (Signed)
 I,Cassandra Campos, CMA,acting as a neurosurgeon for Cassandra LOISE Slocumb, MD.,have documented all relevant documentation on the behalf of Cassandra LOISE Slocumb, MD,as directed by  Cassandra LOISE Slocumb, MD while in the presence of Cassandra LOISE Slocumb, MD.  Subjective:    Patient ID: Cassandra Campos , female    DOB: 09-12-1970 , 54 y.o.   MRN: [id number]  Chief Complaint  Patient presents with   Annual Exam    Patient presents today for annual exam. She reports compliance with medications. Denies headache, chest pain, and SOB. GYN: Cassandra Campos.  Letter sent for dm eye exam.  She would like a referral to Dr Loreli ophthalmologist. [facility] retired.    Diabetes   Hypertension    HPI Discussed the use of AI scribe software for clinical note transcription with the patient, who gave verbal consent to proceed.  History of Present Illness Cassandra Campos is a 54 year old female who presents for a routine physical examination and blood pressure check.  She is under the care of an endocrinologist for diabetes management, with her last A1c approximately 7.0. She is on the highest dose of Ozempic .  She has a history of kidney transplant in 1993 and is currently at stage 3 or 4 post-transplant. She stays hydrated and sees her nephrologist every three months. She is not on dialysis.  She is currently out of work due to numbness in her hand, initially thought to be related to her fistula. A band was placed, but symptoms persist. She has seen a hand specialist and is awaiting a rescheduled nerve conduction study.  She recently had an eye exam but is seeking a new eye doctor due to dissatisfaction with the consultation.  She feels good overall but is concerned about her weight and reports a lack of motivation for exercise. She has bowel movements every other day.   Diabetes She presents for her follow-up diabetic visit. She has type 2 diabetes mellitus. Her disease course has been stable. There are no  hypoglycemic associated symptoms. Pertinent negatives for diabetes include no blurred vision and no chest pain. There are no hypoglycemic complications. Diabetic complications include nephropathy, peripheral neuropathy and retinopathy. Risk factors for coronary artery disease include dyslipidemia, hypertension, diabetes mellitus, post-menopausal, sedentary lifestyle and obesity. She is compliant with treatment some of the time. She is following a diabetic diet. She has not had a previous visit with a dietitian. She participates in exercise intermittently. Her home blood glucose trend is fluctuating minimally. Her breakfast blood glucose is taken between 9-10 am. Her breakfast blood glucose range is generally 140-180 mg/dl. Eye exam is current.  Hypertension This is a chronic problem. The current episode started more than 1 year ago. The problem has been gradually improving since onset. The problem is controlled. Pertinent negatives include no blurred vision, chest pain or shortness of breath. Palpitations: she c/o palpitations. recurrent. no associated chest pain. random. occurs at rest. .The current treatment provides moderate improvement. Hypertensive end-organ damage includes retinopathy. Identifiable causes of hypertension include renovascular disease.     Past Medical History:  Diagnosis Date   Anemia associated with chronic renal failure    blood transfusion 01/01/22 in [place]   Arthritis    ASCUS of cervix with negative high risk HPV 06/2017   Back pain    hx - resolved per pt 03/22/24   Chest pain    hx - reolved per pt 03/22/24   CHF (congestive heart failure) (HCC)  CKD (chronic kidney disease), stage IV (HCC) no dialysis yet   nephrologist-  dr montie dakins-- scheduled next visit 09-08-2017 (lov 07-05-2017)   Constipation    hx   Diabetic retinopathy (HCC)    Dialysis patient    in past prior to transplant in 2023 - dialysis x 4 yrs   Edema, lower extremity    hx   Elevated  transaminase level    Family history of breast cancer    Family history of ovarian cancer    Family history of stomach cancer    Fatty liver    FOLLOWED BY DR ABRAN   GERD (gastroesophageal reflux disease)    Hypertension    Idiopathic gout of multiple sites    09-05-2017 per pt stable   Insulin  dependent type 2 diabetes mellitus (HCC)    followed by dr vincente   Joint pain    occasional   Kidney transplant recipient 12/17/2021   [facility]   Mixed hyperlipidemia    OSA (obstructive sleep apnea)    per study 10-20-2015 mild osa w/ AHI 10.3/hr;  09-05-2017 per pt has not used cpap since 1 yr ago   Palpitations    hx   Peripheral neuropathy    feet   Pneumonia    x 1   Pure hypercholesterolemia 05/21/2021   S/P arteriovenous (AV) fistula creation 07-04-2015  w/ revision 02-14-2017   left braniocephilic   Shortness of breath    hx prior to transplant, no current problems with SOB   Sleep apnea    Trigger thumb, right thumb    Umbilical hernia    Vitamin D deficiency      Family History  Problem Relation Age of Onset   Diabetes Mother    Hypertension Mother    Thyroid  disease Mother    Ovarian cancer Mother        dx 52s   Diabetes Father    Hypertension Father    Cancer Father        STOMACH   Stomach cancer Father 38   Breast cancer Maternal Aunt 60   Cancer Maternal Aunt    Cancer Paternal Uncle        unknown type, dx >50   Stroke Maternal Grandmother    Stroke Maternal Grandfather    Stroke Paternal Grandmother    Cancer Paternal Grandfather        unknown type, mets to brain   Cancer Cousin        unknown type, dx 51s, paternal first cousin   Cancer Other        unknown type, father's first cousin   Cancer Other        unknown types, 4 of mother's first cousins     Current Outpatient Medications:    Acetaminophen  325 MG CAPS, Take 650 mg by mouth., Disp: , Rfl:    albuterol  (VENTOLIN  HFA) 108 (90 Base) MCG/ACT inhaler, Inhale 2 puffs into the  lungs every 6 (six) hours as needed for wheezing or shortness of breath., Disp: 8 g, Rfl: 2   allopurinol  (ZYLOPRIM ) 100 MG tablet, TAKE 1 TABLET BY MOUTH EVERY DAY, Disp: 30 tablet, Rfl: 8   aspirin EC 81 MG tablet, Take 1 tablet by mouth daily., Disp: , Rfl:    [START ON 06/19/2024] belatacept (NULOJIX) 250 MG SOLR injection, Inject into the vein., Disp: , Rfl:    Cholecalciferol (VITAMIN D-3 PO), Take 1 each by mouth daily at 12 noon. gummy, Disp: ,  Rfl:    Continuous Glucose Sensor (DEXCOM G7 SENSOR) MISC, 1 % by Does not apply route continuous. 1 each, Every 14 days, Disp: , Rfl:    furosemide  (LASIX ) 80 MG tablet, Take 80 mg by mouth 2 (two) times daily., Disp: , Rfl:    HYDROcodone -acetaminophen  (NORCO/VICODIN) 5-325 MG tablet, Take 1 tablet by mouth every 6 (six) hours as needed for moderate pain (pain score 4-6)., Disp: 10 tablet, Rfl: 0   [START ON 06/19/2024] hydrocortisone  sodium succinate (SOLU-CORTEF ) 100 MG injection, Inject into the vein., Disp: , Rfl:    Insulin  Disposable Pump (OMNIPOD 5 G7 PODS, GEN 5,) MISC, 1 each by Does not apply route every other day., Disp: 15 each, Rfl: 11   Insulin  Lispro-aabc (LYUMJEV  KWIKPEN) 200 UNIT/ML KwikPen, Use up to 100 Units daily in the insulin  pump., Disp: 48 mL, Rfl: 3   Insulin  Lispro-aabc (LYUMJEV  KWIKPEN) 200 UNIT/ML KwikPen, Use up to 100 units a day in the insulin  pump. We need the U200 concentration.  DAW1., Disp: 45 mL, Rfl: 3   Insulin  Pen Needle 32G X 4 MM MISC, Use 2x a day, Disp: 200 each, Rfl: 3   Insulin  Syringe/Needle U-500 (BD INSULIN  SYRINGE U-500) 31G X 0.5 ML MISC, Use to inject insulin  3 times a day, Disp: 300 each, Rfl: 5   methenamine (HIPREX) 1 g tablet, Take 1 g by mouth daily., Disp: , Rfl:    [START ON 06/19/2024] methylPREDNISolone  sodium succinate (SOLU-MEDROL ) 2000 MG injection, Inject into the vein., Disp: , Rfl:    metroNIDAZOLE  (FLAGYL ) 500 MG tablet, Take 1 tablet (500 mg total) by mouth 2 (two) times daily., Disp:  14 tablet, Rfl: 0   Microlet Lancets MISC, Use as directed to check blood sugars 4 times per day dx: e11.22, Disp: 300 each, Rfl: 2   mycophenolate (MYFORTIC) 180 MG EC tablet, Take 360 mg by mouth 2 (two) times daily. Patient is taking 2 tablets twice daily, Disp: , Rfl:    NIFEdipine  (ADALAT  CC) 30 MG 24 hr tablet, Take by mouth., Disp: , Rfl:    NIFEdipine  (PROCARDIA -XL/NIFEDICAL-XL) 30 MG 24 hr tablet, Take 2 tablets by mouth in the morning and one tablet by mouth In the evening., Disp: 270 tablet, Rfl: 0   omeprazole  (PRILOSEC) 20 MG capsule, TAKE 1 CAPSULE BY MOUTH EVERY DAY, Disp: 90 capsule, Rfl: 1   predniSONE  (DELTASONE ) 5 MG tablet, Take 5 mg by mouth daily with breakfast., Disp: , Rfl:    pregabalin  (LYRICA ) 150 MG capsule, TAKE 1 CAPSULE BY MOUTH EVERY DAY, Disp: 90 capsule, Rfl: 1   rosuvastatin  (CRESTOR ) 20 MG tablet, TAKE 1 TABLET BY MOUTH EVERY DAY, Disp: 90 tablet, Rfl: 1   Semaglutide , 2 MG/DOSE, (OZEMPIC , 2 MG/DOSE,) 8 MG/3ML SOPN, Inject 2 mg into the skin once a week., Disp: 9 mL, Rfl: 3   sulfamethoxazole -trimethoprim  (BACTRIM ) 400-80 MG tablet, Take 1 tablet by mouth 3 (three) times a week. Monday, Wednesday, Friday, Disp: , Rfl:    No Known Allergies    The patient states she uses post menopausal status for birth control. No LMP recorded. Patient has had a hysterectomy.. Negative for Dysmenorrhea. Negative for: breast discharge, breast lump(s), breast pain and breast self exam. Associated symptoms include abnormal vaginal bleeding. Pertinent negatives include abnormal bleeding (hematology), anxiety, decreased libido, depression, difficulty falling sleep, dyspareunia, history of infertility, nocturia, sexual dysfunction, sleep disturbances, urinary incontinence, urinary urgency, vaginal discharge and vaginal itching. Diet regular.The patient states her exercise level is    .  The patient's tobacco use is: Tobacco Use History[1]. She has been exposed to passive smoke. The  patient's alcohol use is:  Social History   Substance and Sexual Activity  Alcohol Use Yes   Comment: socially   Review of Systems  Constitutional: Negative.   HENT: Negative.    Eyes: Negative.  Negative for blurred vision.  Respiratory: Negative.  Negative for shortness of breath.   Cardiovascular: Negative.  Negative for chest pain. Palpitations: she c/o palpitations. recurrent. no associated chest pain. random. occurs at rest. . Gastrointestinal: Negative.   Endocrine: Negative.   Genitourinary: Negative.   Musculoskeletal: Negative.   Skin: Negative.   Allergic/Immunologic: Negative.   Neurological: Negative.   Hematological: Negative.   Psychiatric/Behavioral: Negative.       Today's Vitals   05/21/24 1158  BP: 132/70  Pulse: 98  Temp: 98.3 F (36.8 C)  SpO2: 98%  Weight: 224 lb 12.8 oz (102 kg)  Height: 5' 2 (1.575 m)   Body mass index is 41.12 kg/m.  Wt Readings from Last 3 Encounters:  05/21/24 224 lb 12.8 oz (102 kg)  04/26/24 225 lb 4.8 oz (102.2 kg)  03/26/24 213 lb (96.6 kg)     Objective:  Physical Exam Vitals and nursing note reviewed.  Constitutional:      Appearance: Normal appearance. She is obese.  HENT:     Head: Normocephalic and atraumatic.     Right Ear: Tympanic membrane, ear canal and external ear normal.     Left Ear: Tympanic membrane, ear canal and external ear normal.     Nose: Nose normal.     Mouth/Throat:     Mouth: Mucous membranes are moist.     Pharynx: Oropharynx is clear.  Eyes:     Extraocular Movements: Extraocular movements intact.     Conjunctiva/sclera: Conjunctivae normal.     Pupils: Pupils are equal, round, and reactive to light.  Cardiovascular:     Rate and Rhythm: Normal rate and regular rhythm.     Pulses:          Dorsalis pedis pulses are 1+ on the right side and 1+ on the left side.     Heart sounds: Normal heart sounds.     Comments: Fistula left arm Pulmonary:     Effort: Pulmonary effort is  normal.     Breath sounds: Normal breath sounds.  Chest:  Breasts:    Tanner Score is 5.     Right: Normal.     Left: Normal.  Abdominal:     General: Bowel sounds are normal.     Palpations: Abdomen is soft.     Comments: RLQ healed surgical scar  Genitourinary:    Comments: deferred Musculoskeletal:        General: Normal range of motion.     Cervical back: Normal range of motion and neck supple.     Right lower leg: Edema present.     Left lower leg: Edema present.  Feet:     Right foot:     Protective Sensation: 5 sites tested.  5 sites sensed.     Skin integrity: Dry skin present.     Left foot:     Protective Sensation: 5 sites tested.  5 sites sensed.     Skin integrity: Dry skin present.  Skin:    General: Skin is warm and dry.  Neurological:     General: No focal deficit present.     Mental Status: She is alert and  oriented to person, place, and time.  Psychiatric:        Mood and Affect: Mood normal.        Behavior: Behavior normal.         Assessment And Plan:     Encounter for annual health examination Assessment & Plan: A full exam was performed. Importance of monthly self breast exams was discussed with the patient. PATIENT IS ADVISED TO GET 30-45 MINUTES REGULAR EXERCISE NO LESS THAN FOUR TO FIVE DAYS PER WEEK - BOTH WEIGHTBEARING EXERCISES AND AEROBIC ARE RECOMMENDED.  PATIENT IS ADVISED TO FOLLOW A HEALTHY DIET WITH AT LEAST SIX FRUITS/VEGGIES PER DAY, DECREASE INTAKE OF RED MEAT, AND TO INCREASE FISH INTAKE TO TWO DAYS PER WEEK.  MEATS/FISH SHOULD NOT BE FRIED, BAKED OR BROILED IS PREFERABLE.  IT IS ALSO IMPORTANT TO CUT BACK ON YOUR SUGAR INTAKE. PLEASE AVOID ANYTHING WITH ADDED SUGAR, CORN SYRUP OR OTHER SWEETENERS. IF YOU MUST USE A SWEETENER, YOU CAN TRY STEVIA. IT IS ALSO IMPORTANT TO AVOID ARTIFICIALLY SWEETENERS AND DIET BEVERAGES. LASTLY, I SUGGEST WEARING SPF 50 SUNSCREEN ON EXPOSED PARTS AND ESPECIALLY WHEN IN THE DIRECT SUNLIGHT FOR AN EXTENDED  PERIOD OF TIME.  PLEASE AVOID FAST FOOD RESTAURANTS AND INCREASE YOUR WATER INTAKE.   Orders: -     Lipid panel -     Hemoglobin A1c  Dyslipidemia associated with type 2 diabetes mellitus (HCC) Assessment & Plan: Chronic, diabetic foot exam was performed.  Endo input is appreciated.. Currently on Ozempic  at the highest dose. Consideration of switching to Mounjaro  was mentioned, but exercise remains crucial regardless of medication choice. - Continue Ozempic  at current dose. - Encouraged regular exercise and muscle strengthening activities. - I DISCUSSED WITH THE PATIENT AT LENGTH REGARDING THE GOALS OF GLYCEMIC CONTROL AND POSSIBLE LONG-TERM COMPLICATIONS.  I  ALSO STRESSED THE IMPORTANCE OF COMPLIANCE WITH HOME GLUCOSE MONITORING, DIETARY RESTRICTIONS INCLUDING AVOIDANCE OF SUGARY DRINKS/PROCESSED FOODS,  ALONG WITH REGULAR EXERCISE.  I  ALSO STRESSED THE IMPORTANCE OF ANNUAL EYE EXAMS, SELF FOOT CARE AND COMPLIANCE WITH OFFICE VISITS.   Orders: -     POCT urinalysis dipstick -     Microalbumin / creatinine urine ratio -     EKG 12-Lead -     Ambulatory referral to Ophthalmology  Hypertensive heart and renal disease with renal failure, stage 1 through stage 4 or unspecified chronic kidney disease, with heart failure (HCC) Assessment & Plan: Chronic, fair control. Goal BP<130/80.  EKG performed, NSR w/ -Poor R-wave progression -may be secondary to pulmonary disease  consider old anterior infarct and low voltage.  She will continue with furosemide  40mg  daily and nifedipine  XL 30mg  daily.  She is also followed by Nephrology.  - Follow low sodium diet.    Chronic diastolic (congestive) heart failure (HCC) Assessment & Plan: Chronic diastolic heart failure. Heart rate is elevated, and echocardiogram results are not visible in the current system. - Echo performed at [facility], will request records    Left hand paresthesia Assessment & Plan: Hand numbness under evaluation. Suspected to be  related to fistula, but nerve conduction study is pending rescheduling. - Reschedule nerve conduction study.   Morbid obesity due to excess calories (HCC) Assessment & Plan: BMI 41. She is encouraged to strive to lose ten percent of her body weight to decrease cardiac risk. Discussed the importance of exercise in conjunction with medication to prevent muscle wasting and aid in weight loss. - Encouraged regular exercise and muscle strengthening activities.  Orders: -  Amb Ref to Medical Weight Management  Kidney transplant recipient Assessment & Plan: Kidney transplant recipient, status post-transplant in 2023. - Continue follow-up with nephrologist every three months. - Continue annual follow-up with transplant team.     Return for 6 month bp, 1 year physical. Patient was given opportunity to ask questions. Patient verbalized understanding of the plan and was able to repeat key elements of the plan. All questions were answered to their satisfaction.   I, Cassandra LOISE Slocumb, MD, have reviewed all documentation for this visit. The documentation on 05/21/2024 for the exam, diagnosis, procedures, and orders are all accurate and complete.       [1]  Social History Tobacco Use  Smoking Status Never   Passive exposure: Current  Smokeless Tobacco Never

## 2024-05-22 ENCOUNTER — Other Ambulatory Visit (HOSPITAL_COMMUNITY): Payer: Self-pay

## 2024-05-22 LAB — LIPID PANEL
Chol/HDL Ratio: 3.4 ratio (ref 0.0–4.4)
Cholesterol, Total: 196 mg/dL (ref 100–199)
HDL: 58 mg/dL
LDL Chol Calc (NIH): 114 mg/dL — ABNORMAL HIGH (ref 0–99)
Triglycerides: 134 mg/dL (ref 0–149)
VLDL Cholesterol Cal: 24 mg/dL (ref 5–40)

## 2024-05-22 LAB — MICROALBUMIN / CREATININE URINE RATIO
Creatinine, Urine: 63.1 mg/dL
Microalb/Creat Ratio: 75 mg/g{creat} — ABNORMAL HIGH (ref 0–29)
Microalbumin, Urine: 47.3 ug/mL

## 2024-05-22 LAB — HEMOGLOBIN A1C
Est. average glucose Bld gHb Est-mCnc: 151 mg/dL
Hgb A1c MFr Bld: 6.9 % — ABNORMAL HIGH (ref 4.8–5.6)

## 2024-05-24 ENCOUNTER — Encounter: Payer: Self-pay | Admitting: Internal Medicine

## 2024-05-25 ENCOUNTER — Ambulatory Visit: Admitting: Internal Medicine

## 2024-05-26 ENCOUNTER — Encounter: Payer: Self-pay | Admitting: Internal Medicine

## 2024-05-27 DIAGNOSIS — R202 Paresthesia of skin: Secondary | ICD-10-CM | POA: Insufficient documentation

## 2024-05-27 NOTE — Assessment & Plan Note (Signed)
 Chronic diastolic heart failure. Heart rate is elevated, and echocardiogram results are not visible in the current system. - Echo performed at [facility], will request records

## 2024-05-27 NOTE — Assessment & Plan Note (Signed)
 Hand numbness under evaluation. Suspected to be related to fistula, but nerve conduction study is pending rescheduling. - Reschedule nerve conduction study.

## 2024-05-27 NOTE — Assessment & Plan Note (Addendum)
 Chronic, diabetic foot exam was performed.  Endo input is appreciated.. Currently on Ozempic  at the highest dose. Consideration of switching to Mounjaro  was mentioned, but exercise remains crucial regardless of medication choice. - Continue Ozempic  at current dose. - Encouraged regular exercise and muscle strengthening activities. - I DISCUSSED WITH THE PATIENT AT LENGTH REGARDING THE GOALS OF GLYCEMIC CONTROL AND POSSIBLE LONG-TERM COMPLICATIONS.  I  ALSO STRESSED THE IMPORTANCE OF COMPLIANCE WITH HOME GLUCOSE MONITORING, DIETARY RESTRICTIONS INCLUDING AVOIDANCE OF SUGARY DRINKS/PROCESSED FOODS,  ALONG WITH REGULAR EXERCISE.  I  ALSO STRESSED THE IMPORTANCE OF ANNUAL EYE EXAMS, SELF FOOT CARE AND COMPLIANCE WITH OFFICE VISITS.

## 2024-05-27 NOTE — Assessment & Plan Note (Signed)
 Kidney transplant recipient, status post-transplant in 2023. - Continue follow-up with nephrologist every three months. - Continue annual follow-up with transplant team.

## 2024-05-27 NOTE — Assessment & Plan Note (Addendum)
 Chronic, fair control. Goal BP<130/80.  EKG performed, NSR w/ -Poor R-wave progression -may be secondary to pulmonary disease  consider old anterior infarct and low voltage.  She will continue with furosemide  40mg  daily and nifedipine  XL 30mg  daily.  She is also followed by Nephrology.  - Follow low sodium diet.

## 2024-05-27 NOTE — Assessment & Plan Note (Addendum)
 BMI 41. She is encouraged to strive to lose ten percent of her body weight to decrease cardiac risk. Discussed the importance of exercise in conjunction with medication to prevent muscle wasting and aid in weight loss. - Encouraged regular exercise and muscle strengthening activities.

## 2024-05-27 NOTE — Assessment & Plan Note (Signed)
 A full exam was performed. Importance of monthly self breast exams was discussed with the patient. PATIENT IS ADVISED TO GET 30-45 MINUTES REGULAR EXERCISE NO LESS THAN FOUR TO FIVE DAYS PER WEEK - BOTH WEIGHTBEARING EXERCISES AND AEROBIC ARE RECOMMENDED.  PATIENT IS ADVISED TO FOLLOW A HEALTHY DIET WITH AT LEAST SIX FRUITS/VEGGIES PER DAY, DECREASE INTAKE OF RED MEAT, AND TO INCREASE FISH INTAKE TO TWO DAYS PER WEEK.  MEATS/FISH SHOULD NOT BE FRIED, BAKED OR BROILED IS PREFERABLE.  IT IS ALSO IMPORTANT TO CUT BACK ON YOUR SUGAR INTAKE. PLEASE AVOID ANYTHING WITH ADDED SUGAR, CORN SYRUP OR OTHER SWEETENERS. IF YOU MUST USE A SWEETENER, YOU CAN TRY STEVIA. IT IS ALSO IMPORTANT TO AVOID ARTIFICIALLY SWEETENERS AND DIET BEVERAGES. LASTLY, I SUGGEST WEARING SPF 50 SUNSCREEN ON EXPOSED PARTS AND ESPECIALLY WHEN IN THE DIRECT SUNLIGHT FOR AN EXTENDED PERIOD OF TIME.  PLEASE AVOID FAST FOOD RESTAURANTS AND INCREASE YOUR WATER INTAKE.

## 2024-05-28 ENCOUNTER — Encounter: Payer: Self-pay | Admitting: Internal Medicine

## 2024-05-28 ENCOUNTER — Telehealth: Payer: Self-pay | Admitting: Physical Medicine and Rehabilitation

## 2024-05-28 NOTE — Telephone Encounter (Signed)
 Pt called stating they sent a referral for nerve study to another facility can't get until end of Feb 2/26 and she is out of short term and if our machine starts working before than to call and schedule her with [facility]. Please call pt at [phone]

## 2024-05-29 ENCOUNTER — Ambulatory Visit: Admitting: Internal Medicine

## 2024-05-29 ENCOUNTER — Other Ambulatory Visit: Payer: Self-pay

## 2024-05-29 ENCOUNTER — Encounter: Payer: Self-pay | Admitting: Internal Medicine

## 2024-05-29 VITALS — BP 122/70 | HR 100 | Ht 62.0 in | Wt 225.4 lb

## 2024-05-29 DIAGNOSIS — Z6838 Body mass index (BMI) 38.0-38.9, adult: Secondary | ICD-10-CM | POA: Diagnosis not present

## 2024-05-29 DIAGNOSIS — E66812 Obesity, class 2: Secondary | ICD-10-CM

## 2024-05-29 DIAGNOSIS — E1122 Type 2 diabetes mellitus with diabetic chronic kidney disease: Secondary | ICD-10-CM | POA: Diagnosis not present

## 2024-05-29 DIAGNOSIS — E1169 Type 2 diabetes mellitus with other specified complication: Secondary | ICD-10-CM

## 2024-05-29 DIAGNOSIS — Z794 Long term (current) use of insulin: Secondary | ICD-10-CM

## 2024-05-29 DIAGNOSIS — Z992 Dependence on renal dialysis: Secondary | ICD-10-CM

## 2024-05-29 DIAGNOSIS — N186 End stage renal disease: Secondary | ICD-10-CM

## 2024-05-29 DIAGNOSIS — E785 Hyperlipidemia, unspecified: Secondary | ICD-10-CM

## 2024-05-29 NOTE — Progress Notes (Signed)
 " Patient ID: Cassandra Campos, female   DOB: 02-Aug-1970, 54 y.o.   MRN: [id number]   HPI: [name] is a 54 y.o.-year-old female, returning for follow-up for DM2, dx in 1997, insulin -dependent right after dx, uncontrolled, with complications (ESRD-on HD since 12/2017 >> s/p kidney transplant 12/2021, DR, PN).  Last visit 3 months ago.  Interim hx: No increased urination, blurry vision, chest pain.  She was able to start the OmniPod insulin  pump last year. She also continues Ozempic , without side effects.  However, she does not feel that she is losing more weight on it, though.  Reviewed HbA1c levels: Lab Results  Component Value Date   HGBA1C 6.9 (H) 05/21/2024   HGBA1C 8.2 (A) 01/12/2024   HGBA1C 8.0 (H) 05/26/2023   HGBA1C 6.7 (A) 03/15/2023   HGBA1C 7.1 (H) 11/17/2022   HGBA1C 7.5 (A) 09/09/2021   HGBA1C 6.3 (H) 02/02/2021   HGBA1C 8.5 (A) 07/30/2020   HGBA1C 7.9 (H) 01/08/2020   HGBA1C 8.7 (A) 09/11/2019  08/25/2023: HbA1c 9.2% 02/24/2021: HbA1c 6.8% 11/18/2020: HbA1c 8.9% 05/26/2020: HbA1c 9.8%  Previously on: - Ozempic  1 mg weekly -restarted 11/2020 >> 2 mg weekly >> stopped 08/2021 2/2 high lipase - U500 insulin  (started 2020): - 180-195 >> 200 >> 130-150 >> 140-200 units before breakfast  - 140-150 units before dinner if dinner is early, and 110-120 units if dinner is after dialysis >> 160-170 >> 140-160 >> 110-120 >> 110-140 units before dinner  Then on: - Mounjaro  5 mg weekly by PCP - started 07/2023.  Per my discussion with PCP, it appears that the previous high lipase could have been related to her end-stage renal disease. - Basaglar  24 units in a.m. and 14 units in p.m. >> 40-50 units 2x a day - Novolog  >> FiAsp  10 units 3x a day before meals, + SSI >> Lyumjev  35-45 units before meals 2-3x a day BG < 150: give no additional insulin ;  BG 151-200: add 2 units;  BG 201-250: add 4 units;  BG 251-300: add 6 units;  BG 301-350: add 8 units;  BG  351-400: add 10 units   Prev. On: - Mounjaro  10 mg weekly >> Ozempic  2 mg weekly (changed due to decreased efficacy) - Basaglar  50-55 units daily - Lyumjev  40-45 units before meals  Now on: - Ozempic  2 mg weekly - Insulin  pump -OmniPod 5 G7: - changed to U200 Lyumjev  - Basal rates: 12 am: 1.85 >> 0.95 units/h - Insulin  to carb ratio: 12 am:  1:1 >> 1:2 >> 1.5 - Target: 12 am: 110-110 - Correction factor (insulin  sensitivity factor):  12 am: 25 >> 50 - Active insulin  time: 4h - Changes infusion site: q3 days  Total daily dose from basal insulin : 54% (43.2 units) >> 59% (24 units) >> 68% (28.4 units) Total daily dose from bolus insulin : 46% (37.1 units) >> 41%  >> 32% (13.2 units) TDD 43-60 >> 42-60 units   CGM: - Libre 2 plus >> now Dexcom G7  Pt checks her sugars more than 4 times a day with her CGM:  Previously:  Previously:  Lowest sugar was 52 >> 98 >> 69 >> 78; she has hypoglycemia awareness in the 60s. Highest sugar was 455 >>... 300s (pie) >> 400 >> 300s.  Glucometer: Contour next  Pt's meals are: - Breakfast: yoghurt and blueberries; egg McMuffin - Lunch: fish, tacos, subs, cake - Dinner: salad, steak, shrimp - occasionally after HD at 10 pm, but more recently she has this during  dialysis, around 7 PM - Snacks: fruit, peanuts  -+ H/o End-stage renal disease, now s/p kidney transplant-seeing nephrology, last BUN/creatinine:  Lab Results  Component Value Date   BUN 20 03/26/2024   BUN 26 (H) 07/20/2023   CREATININE 2.80 (H) 03/26/2024   CREATININE 2.47 (H) 07/20/2023   She was on hemodialysis previously.  -+ HL; last set of lipids: Lab Results  Component Value Date   CHOL 196 05/21/2024   HDL 58 05/21/2024   LDLCALC 114 (H) 05/21/2024   TRIG 134 05/21/2024   CHOLHDL 3.4 05/21/2024  06/24/2021: 130/299/42/42 On Crestor  20.  - last eye exam was 04/30/2024: + DR reportedly (Dr Octavia). + h/o retinal detachment, + cataracts.  She was getting IO  injections, not recently >> now needs Laser Sx. She also sees Dr. Tobie (retina specialist).  - she has numbness and tingling in her feet.  She changed from Lyrica  to Neurontin 300 mg, then back on Lyrica  - [facility].  Last foot exam 01/12/2024.  Pt has FH of DM in M, F.  She also has a history of NAFLD, OSA (but not on CPAP, since she could not tolerate it-now on nasal pillows), gout.  She had a kidney transplant 12/17/2021. Now on Prednisone  5 mg daily.   ROS:  + numbness and tingling in his feet  Past Medical History:  Diagnosis Date   Anemia associated with chronic renal failure    blood transfusion 01/01/22 in [place]   Arthritis    ASCUS of cervix with negative high risk HPV 06/2017   Back pain    hx - resolved per pt 03/22/24   Chest pain    hx - reolved per pt 03/22/24   CHF (congestive heart failure) (HCC)    CKD (chronic kidney disease), stage IV (HCC) no dialysis yet   nephrologist-  dr montie dakins-- scheduled next visit 09-08-2017 (lov 07-05-2017)   Constipation    hx   Diabetic retinopathy (HCC)    Dialysis patient    in past prior to transplant in 2023 - dialysis x 4 yrs   Edema, lower extremity    hx   Elevated transaminase level    Family history of breast cancer    Family history of ovarian cancer    Family history of stomach cancer    Fatty liver    FOLLOWED BY DR ABRAN   GERD (gastroesophageal reflux disease)    Hypertension    Idiopathic gout of multiple sites    09-05-2017 per pt stable   Insulin  dependent type 2 diabetes mellitus (HCC)    followed by dr vincente   Joint pain    occasional   Kidney transplant recipient 12/17/2021   [facility]   Mixed hyperlipidemia    OSA (obstructive sleep apnea)    per study 10-20-2015 mild osa w/ AHI 10.3/hr;  09-05-2017 per pt has not used cpap since 1 yr ago   Palpitations    hx   Peripheral neuropathy    feet   Pneumonia    x 1   Pure hypercholesterolemia 05/21/2021   S/P arteriovenous (AV) fistula  creation 07-04-2015  w/ revision 02-14-2017   left braniocephilic   Shortness of breath    hx prior to transplant, no current problems with SOB   Sleep apnea    Trigger thumb, right thumb    Umbilical hernia    Vitamin D deficiency    Past Surgical History:  Procedure Laterality Date   A/V FISTULAGRAM N/A  04/01/2021   Procedure: A/V FISTULAGRAM;  Surgeon: Lanis Fonda BRAVO, MD;  Location: [facility] INVASIVE CV LAB;  Service: Cardiovascular;  Laterality: N/A;   ABDOMINAL HYSTERECTOMY  07/11/2000   dr gott   TAH/BSO for irregular bleeding and leiomyoma   AV FISTULA PLACEMENT Left 07/04/2015   Procedure: ARTERIOVENOUS (AV) FISTULA CREATION-LEFT;  Surgeon: Lynwood JONETTA Collum, MD;  Location: [facility] OR;  Service: Vascular;  Laterality: Left;   BREAST BIOPSY Right    benign   CARPAL TUNNEL RELEASE Bilateral 1993 and 1994   CESAREAN SECTION  1993:  09-25-1999   BILATERAL TUBAL LIGATION W/ LAST C/S   DILATION AND CURETTAGE OF UTERUS     EXPLORATORY LAPARTOMY / LYSIS ADHESIONS/  BILATERAL SALPINGOOPHORECTOMY  07-20-2011  dr s. vonzell  [facility]   EYE SURGERY  09-2012   FISTULA SUPERFICIALIZATION Left 08/28/2015   Procedure: BRACHIOCEPHALIC FISTULA SUPERFICIALIZATION;  Surgeon: Lynwood JONETTA Collum, MD;  Location: [facility] OR;  Service: Vascular;  Laterality: Left;   KIDNEY TRANSPLANT  12/17/2021   PATCH ANGIOPLASTY Left 02/14/2017   Procedure: PATCH ANGIOPLASTY;  Surgeon: Harvey Carlin BRAVO, MD;  Location: [facility] OR;  Service: Vascular;  Laterality: Left;   PELVIC LAPAROSCOPY  1994   exploration for ectopic preg.   PERIPHERAL VASCULAR BALLOON ANGIOPLASTY  04/01/2021   Procedure: PERIPHERAL VASCULAR BALLOON ANGIOPLASTY;  Surgeon: Lanis Fonda BRAVO, MD;  Location: [facility] INVASIVE CV LAB;  Service: Cardiovascular;;   RETINAL DETACHMENT SURGERY Left 2015   REVISON OF ARTERIOVENOUS FISTULA Left 02/14/2017   Procedure: REVISON OF ARTERIOVENOUS FISTULA  LEFT ARM;  Surgeon: Harvey Carlin BRAVO, MD;  Location: [facility] OR;  Service: Vascular;   Laterality: Left;   REVISON OF ARTERIOVENOUS FISTULA Left 07/24/2021   Procedure: REVISON OF ARTERIOVENOUS FISTULA ARM;  Surgeon: Eliza Lonni RAMAN, MD;  Location: [facility] OR;  Service: Vascular;  Laterality: Left;   REVISON OF ARTERIOVENOUS FISTULA Left 03/26/2024   Procedure: REVISON OF ARTERIOVENOUS FISTULA;  Surgeon: Lanis Fonda BRAVO, MD;  Location: [facility] OR;  Service: Vascular;  Laterality: Left;   TRIGGER FINGER RELEASE Left 2015   thumb, also done in 2022   TRIGGER FINGER RELEASE Right 09/09/2017   Procedure: RELEASE TRIGGER FINGER/A-1 PULLEY RIGHT THUMB;  Surgeon: Yvone Rush, MD;  Location: [facility];  Service: Orthopedics;  Laterality: Right;   TUBAL LIGATION  09-1999   Social History   Socioeconomic History   Marital status: Married    Spouse name: Not on file   Number of children: 2   Years of education: Not on file   Highest education level: Some college, no degree  Occupational History   Occupation: medical billing and insurance  Tobacco Use   Smoking status: Never    Passive exposure: Current   Smokeless tobacco: Never  Vaping Use   Vaping status: Never Used  Substance and Sexual Activity   Alcohol use: Yes    Comment: socially   Drug use: No   Sexual activity: Yes    Partners: Male    Birth control/protection: Surgical    Comment: HYSTERECTOMY-1st intercourse 54 yo-Fewer than 5 partners  Other Topics Concern   Not on file  Social History Narrative   Not on file   Social Drivers of Health   Tobacco Use: Medium Risk (05/21/2024)   Patient History    Smoking Tobacco Use: Never    Smokeless Tobacco Use: Never    Passive Exposure: Current  Financial Resource Strain: Low Risk (05/19/2023)   Overall Financial Resource Strain (CARDIA)  Difficulty of Paying Living Expenses: Not very hard  Food Insecurity: No Food Insecurity (01/25/2024)   Epic    Worried About Programme Researcher, Broadcasting/film/video in the Last Year: Never true    Ran Out of Food in the Last Year:  Never true  Transportation Needs: No Transportation Needs (01/25/2024)   Epic    Lack of Transportation (Medical): No    Lack of Transportation (Non-Medical): No  Physical Activity: Insufficiently Active (12/01/2023)   Exercise Vital Sign    Days of Exercise per Week: 3 days    Minutes of Exercise per Session: 20 min  Stress: No Stress Concern Present (12/01/2023)   [employer] of Occupational Health - Occupational Stress Questionnaire    Feeling of Stress: Not at all  Social Connections: Socially Integrated (12/01/2023)   Social Connection and Isolation Panel    Frequency of Communication with Friends and Family: More than three times a week    Frequency of Social Gatherings with Friends and Family: More than three times a week    Attends Religious Services: 1 to 4 times per year    Active Member of Clubs or Organizations: Yes    Attends Banker Meetings: More than 4 times per year    Marital Status: Married  Catering Manager Violence: Not At Risk (01/25/2024)   Epic    Fear of Current or Ex-Partner: No    Emotionally Abused: No    Physically Abused: No    Sexually Abused: No  Depression (PHQ2-9): Low Risk (02/23/2024)   Depression (PHQ2-9)    PHQ-2 Score: 0  Alcohol Screen: Low Risk (12/01/2023)   Alcohol Screen    Last Alcohol Screening Score (AUDIT): 1  Housing: Unknown (01/25/2024)   Epic    Unable to Pay for Housing in the Last Year: No    Number of Times Moved in the Last Year: Not on file    Homeless in the Last Year: No  Utilities: Not At Risk (01/25/2024)   Epic    Threatened with loss of utilities: No  Health Literacy: Adequate Health Literacy (12/01/2023)   B1300 Health Literacy    Frequency of need for help with medical instructions: Never   Current Outpatient Medications on File Prior to Visit  Medication Sig Dispense Refill   Acetaminophen  325 MG CAPS Take 650 mg by mouth.     albuterol  (VENTOLIN  HFA) 108 (90 Base) MCG/ACT inhaler Inhale 2 puffs  into the lungs every 6 (six) hours as needed for wheezing or shortness of breath. 8 g 2   allopurinol  (ZYLOPRIM ) 100 MG tablet TAKE 1 TABLET BY MOUTH EVERY DAY 30 tablet 8   aspirin EC 81 MG tablet Take 1 tablet by mouth daily.     [START ON 06/19/2024] belatacept (NULOJIX) 250 MG SOLR injection Inject into the vein.     Cholecalciferol (VITAMIN D-3 PO) Take 1 each by mouth daily at 12 noon. gummy     Continuous Glucose Sensor (DEXCOM G7 SENSOR) MISC 1 % by Does not apply route continuous. 1 each, Every 14 days     furosemide  (LASIX ) 80 MG tablet Take 80 mg by mouth 2 (two) times daily.     HYDROcodone -acetaminophen  (NORCO/VICODIN) 5-325 MG tablet Take 1 tablet by mouth every 6 (six) hours as needed for moderate pain (pain score 4-6). 10 tablet 0   [START ON 06/19/2024] hydrocortisone  sodium succinate (SOLU-CORTEF ) 100 MG injection Inject into the vein.     Insulin  Disposable Pump (OMNIPOD 5 G7 PODS,  GEN 5,) MISC 1 each by Does not apply route every other day. 15 each 11   Insulin  Lispro-aabc (LYUMJEV  KWIKPEN) 200 UNIT/ML KwikPen Use up to 100 Units daily in the insulin  pump. 48 mL 3   Insulin  Lispro-aabc (LYUMJEV  KWIKPEN) 200 UNIT/ML KwikPen Use up to 100 units a day in the insulin  pump. We need the U200 concentration.  DAW1. 45 mL 3   Insulin  Pen Needle 32G X 4 MM MISC Use 2x a day 200 each 3   Insulin  Syringe/Needle U-500 (BD INSULIN  SYRINGE U-500) 31G X 0.5 ML MISC Use to inject insulin  3 times a day 300 each 5   methenamine (HIPREX) 1 g tablet Take 1 g by mouth daily.     [START ON 06/19/2024] methylPREDNISolone  sodium succinate (SOLU-MEDROL ) 2000 MG injection Inject into the vein.     metroNIDAZOLE  (FLAGYL ) 500 MG tablet Take 1 tablet (500 mg total) by mouth 2 (two) times daily. 14 tablet 0   Microlet Lancets MISC Use as directed to check blood sugars 4 times per day dx: e11.22 300 each 2   mycophenolate (MYFORTIC) 180 MG EC tablet Take 360 mg by mouth 2 (two) times daily. Patient is taking 2  tablets twice daily     NIFEdipine  (ADALAT  CC) 30 MG 24 hr tablet Take by mouth.     NIFEdipine  (PROCARDIA -XL/NIFEDICAL-XL) 30 MG 24 hr tablet Take 2 tablets by mouth in the morning and one tablet by mouth In the evening. 270 tablet 0   omeprazole  (PRILOSEC) 20 MG capsule TAKE 1 CAPSULE BY MOUTH EVERY DAY 90 capsule 1   predniSONE  (DELTASONE ) 5 MG tablet Take 5 mg by mouth daily with breakfast.     pregabalin  (LYRICA ) 150 MG capsule TAKE 1 CAPSULE BY MOUTH EVERY DAY 90 capsule 1   rosuvastatin  (CRESTOR ) 20 MG tablet TAKE 1 TABLET BY MOUTH EVERY DAY 90 tablet 1   Semaglutide , 2 MG/DOSE, (OZEMPIC , 2 MG/DOSE,) 8 MG/3ML SOPN Inject 2 mg into the skin once a week. 9 mL 3   sulfamethoxazole -trimethoprim  (BACTRIM ) 400-80 MG tablet Take 1 tablet by mouth 3 (three) times a week. Monday, Wednesday, Friday     No current facility-administered medications on file prior to visit.   No Known Allergies  Family History  Problem Relation Age of Onset   Diabetes Mother    Hypertension Mother    Thyroid  disease Mother    Ovarian cancer Mother        dx 5s   Diabetes Father    Hypertension Father    Cancer Father        STOMACH   Stomach cancer Father 41   Breast cancer Maternal Aunt 86   Cancer Maternal Aunt    Cancer Paternal Uncle        unknown type, dx >50   Stroke Maternal Grandmother    Stroke Maternal Grandfather    Stroke Paternal Grandmother    Cancer Paternal Grandfather        unknown type, mets to brain   Cancer Cousin        unknown type, dx 19s, paternal first cousin   Cancer Other        unknown type, father's first cousin   Cancer Other        unknown types, 4 of mother's first cousins   PE: BP 122/70   Pulse 100   Ht 5' 2 (1.575 m)   Wt 225 lb 6.4 oz (102.2 kg)   SpO2 94%  BMI 41.23 kg/m  Wt Readings from Last 15 Encounters:  05/29/24 225 lb 6.4 oz (102.2 kg)  05/21/24 224 lb 12.8 oz (102 kg)  04/26/24 225 lb 4.8 oz (102.2 kg)  03/26/24 213 lb (96.6 kg)   02/23/24 222 lb 3.2 oz (100.8 kg)  02/23/24 221 lb (100.2 kg)  01/26/24 229 lb (103.9 kg)  01/12/24 221 lb (100.2 kg)  09/05/23 227 lb 9.6 oz (103.2 kg)  07/20/23 230 lb 6.4 oz (104.5 kg)  05/26/23 233 lb 12.8 oz (106.1 kg)  04/21/23 222 lb 4.8 oz (100.8 kg)  03/15/23 217 lb (98.4 kg)  02/22/23 210 lb (95.3 kg)  11/17/22 212 lb 6.4 oz (96.3 kg)   Constitutional: overweight, in NAD Eyes:  EOMI, no exophthalmos ENT: no neck masses, no cervical lymphadenopathy Cardiovascular: tachycardia, RR, No MRG, + periankle B edema Respiratory: CTA B Musculoskeletal: no deformities Skin:no rashes Neurological: no tremor with outstretched hands, only in 2nd R finger  ASSESSMENT: 1. DM2, insulin -dependent, uncontrolled, with complications - ESRD, on HD, awaiting kidney transplant ([facility]) - DR - PN  06/24/2021: Glucose 135, C-peptide 10.9  2. HL  3.  Obesity class III  PLAN:  1. Patient with longstanding, uncontrolled, type 2 diabetes, very insulin  resistant, previously on U-500 concentrated insulin , but currently on an the OmniPod 5 pump integrated with a Dexcom CGM with Lyumjev  U200 insulin  in the pump.  She is also on Ozempic  which greatly helped with her diabetes control.  At last visit, she felt that Ozempic  was not decreasing her appetite as much as before but we discussed about the fact that developing tolerance to it is a physiologic mechanism.  She is currently on the 2 mg dose, tolerated well.  At last visit, sugars were significantly improved from before but still increasing after approximately 2 to 3 PM and, per reviewing the pump downloads, this may have been related to not introducing enough carbs into the pump.  She was not entering more than 20 g carbs at the time as she saw that this was the maximum that she could enter.  We discussed about entering as many carbs that she was eating and I also relaxed her ICR.  We also discussed about other insulin  pumps on the market, including  twiist and ilet.  HbA1c at that time was 8.2%. - She recently had another HbA1c which was 6.9%, significantly improved. CGM interpretation: -At today's visit, we reviewed her CGM downloads: It appears that 72% of values are in target range (goal >70%), while 28% are higher than 180 (goal <25%), and 0% are lower than 70 (goal <4%).  The calculated average blood sugar is 162.  The projected HbA1c for the next 3 months (GMI) is 7.2%. -Reviewing the CGM trends, sugars appear to be fairly well-controlled, with only hyperglycemic exceptions occasionally around dinnertime and later, after 9 PM.  Upon reviewing individual, she is not introducing carbs into the pump whenever eating, but sometimes later and it does not move that she is introducing enough carbs.  We discussed about trying to enter all of the carbs that she is eating at the beginning of the meals.  She is wondering about which phone apps she can use for carb counting and I made several suggestions.  However, otherwise, especially in the light of the most recent HbA1c which was excellent, I did not recommend other changes. -We discussed about the fact that she does not have significantly decreased appetite with Ozempic .  I advised her that this  can happen with any of the incretin mimetic's, and in fact, she experienced this with Mounjaro , also.  For now, we will continue on Ozempic . - I suggested to:  Patient Instructions  Please continue: - Ozempic  2 mg weekly   For the pump: - Basal rates: 12 am: 0.95 units/h - Insulin  to carb ratio: 12 am:  1:5 - Target: 12 am: 110-110 - Correction factor (insulin  sensitivity factor):  12 am: 50 - Active insulin  time: 4h  Start all the boluses before meals.   Enter all carbs into the pump before meals.  You can use Calorie King and My Fitness Pal for carb counting.   Please come back in 3-4 months.  - advised to check sugars at different times of the day - 4x a day, rotating check times -  advised for yearly eye exams >> she is UTD - return to clinic in 3 months  2. HL - Latest lipid panel was reviewed from a week ago: LDL above target, the rest of the fractions at goal: Lab Results  Component Value Date   CHOL 196 05/21/2024   HDL 58 05/21/2024   LDLCALC 114 (H) 05/21/2024   TRIG 134 05/21/2024   CHOLHDL 3.4 05/21/2024  - She continues Crestor  20 mg daily without side effects  3.  Obesity class III -She was going to the [facility] weight management clinic in the past, but she mentioned that the suggested diet was not a good fit for her due to hemodialysis -we had to stop Ozempic  as her lipase was very high, at 499, down from 523, but still very high.  However, her PCP contacted me since then regarding the elevated lipase likely related to her CKD.  Earlier last year, PCP was able to successfully start her on Mounjaro  which she tolerated well.  Therefore, I sent the prescription to her pharmacy and she now tolerates well the 2 mg weekly dose.  Lela Fendt, MD PhD [facility]  "

## 2024-05-29 NOTE — Patient Instructions (Addendum)
 Please continue: - Ozempic  2 mg weekly   For the pump: - Basal rates: 12 am: 0.95 units/h - Insulin  to carb ratio: 12 am:  1:5 - Target: 12 am: 110-110 - Correction factor (insulin  sensitivity factor):  12 am: 50 - Active insulin  time: 4h  Start all the boluses before meals.   Enter all carbs into the pump before meals.  You can use Calorie King and My Fitness Pal for carb counting.   Please come back in 3-4 months.

## 2024-05-31 ENCOUNTER — Encounter: Payer: Self-pay | Admitting: Physical Medicine & Rehabilitation

## 2024-05-31 ENCOUNTER — Telehealth: Payer: Self-pay

## 2024-05-31 NOTE — Patient Outreach (Signed)
 Complex Care Management   Visit Note  05/29/2024  Name:  Cassandra Campos MRN: [id number] DOB: May 01, 1971  Situation: Referral received for Complex Care Management related to Diabetes with Complications, Chronic Kidney Disease, and Palpitations, Kidney transplant recipient, Obesity, left hand pain/numbness. I obtained verbal consent from Patient.  Visit completed with Patient on the phone.  Background:   Past Medical History:  Diagnosis Date   Anemia associated with chronic renal failure    blood transfusion 01/01/22 in [place]   Arthritis    ASCUS of cervix with negative high risk HPV 06/2017   Back pain    hx - resolved per pt 03/22/24   Chest pain    hx - reolved per pt 03/22/24   CHF (congestive heart failure) (HCC)    CKD (chronic kidney disease), stage IV (HCC) no dialysis yet   nephrologist-  dr montie dakins-- scheduled next visit 09-08-2017 (lov 07-05-2017)   Constipation    hx   Diabetic retinopathy (HCC)    Dialysis patient    in past prior to transplant in 2023 - dialysis x 4 yrs   Edema, lower extremity    hx   Elevated transaminase level    Family history of breast cancer    Family history of ovarian cancer    Family history of stomach cancer    Fatty liver    FOLLOWED BY DR ABRAN   GERD (gastroesophageal reflux disease)    Hypertension    Idiopathic gout of multiple sites    09-05-2017 per pt stable   Insulin  dependent type 2 diabetes mellitus (HCC)    followed by dr vincente   Joint pain    occasional   Kidney transplant recipient 12/17/2021   [facility]   Mixed hyperlipidemia    OSA (obstructive sleep apnea)    per study 10-20-2015 mild osa w/ AHI 10.3/hr;  09-05-2017 per pt has not used cpap since 1 yr ago   Palpitations    hx   Peripheral neuropathy    feet   Pneumonia    x 1   Pure hypercholesterolemia 05/21/2021   S/P arteriovenous (AV) fistula creation 07-04-2015  w/ revision 02-14-2017   left braniocephilic   Shortness of breath     hx prior to transplant, no current problems with SOB   Sleep apnea    Trigger thumb, right thumb    Umbilical hernia    Vitamin D deficiency     Assessment: Patient Reported Symptoms:  Cognitive Cognitive Status: Able to follow simple commands, Normal speech and language skills Cognitive/Intellectual Conditions Management [RPT]: None reported or documented in medical history or problem list   Health Maintenance Behaviors: Annual physical exam, Healthy diet Health Facilitated by: Healthy diet, Pain control, Rest  Neurological Neurological Review of Symptoms: Numbness Oher Neurological Symptoms/Conditions [RPT]: bilateral neuropathy to both legs; left hand numbness and pain    HEENT HEENT Symptoms Reported: No symptoms reported      Cardiovascular Cardiovascular Symptoms Reported: No symptoms reported    Respiratory Respiratory Symptoms Reported: No symptoms reported    Endocrine Endocrine Symptoms Reported: No symptoms reported Is patient diabetic?: Yes Is patient checking blood sugars at home?: Yes List most recent blood sugar readings, include date and time of day: FBS averaging 100's Endocrine Self-Management Outcome: 4 (good)  Gastrointestinal Gastrointestinal Symptoms Reported: No symptoms reported      Genitourinary Genitourinary Symptoms Reported: No symptoms reported    Integumentary Integumentary Symptoms Reported: Skin changes Additional Integumentary Details: skin  dryness Skin Management Strategies: Routine screening (may ask for a dermatology referral) Skin Self-Management Outcome: 4 (good)  Musculoskeletal Musculoskelatal Symptoms Reviewed: Other Other Musculoskeletal Symptoms: left hand pain Musculoskeletal Management Strategies: Routine screening Musculoskeletal Self-Management Outcome: 3 (uncertain) Musculoskeletal Comment: patient is awaiting a nerve conduction study to rule out carpal tunnel Falls in the past year?: No Number of falls in past year: 1 or  less Was there an injury with Fall?: No Fall Risk Category Calculator: 0 Patient Fall Risk Level: Low Fall Risk Patient at Risk for Falls Due to: No Fall Risks Fall risk Follow up: Falls evaluation completed  Psychosocial Psychosocial Symptoms Reported: No symptoms reported   Major Change/Loss/Stressor/Fears (CP): Denies Quality of Family Relationships: supportive, helpful, involved Do you feel physically threatened by others?: No    05/31/2024    PHQ2-9 Depression Screening   Cassandra Campos interest or pleasure in doing things    Feeling down, depressed, or hopeless    PHQ-2 - Total Score    Trouble falling or staying asleep, or sleeping too much    Feeling tired or having Cassandra Campos energy    Poor appetite or overeating     Feeling bad about yourself - or that you are a failure or have let yourself or your family down    Trouble concentrating on things, such as reading the newspaper or watching television    Moving or speaking so slowly that other people could have noticed.  Or the opposite - being so fidgety or restless that you have been moving around a lot more than usual    Thoughts that you would be better off dead, or hurting yourself in some way    PHQ2-9 Total Score    If you checked off any problems, how difficult have these problems made it for you to do your work, take care of things at home, or get along with other people    Depression Interventions/Treatment      There were no vitals filed for this visit. Pain Scale: Not given for pain  Medications Reviewed Today     Reviewed by Cassandra Cassandra CROME, RN (Registered Nurse) on 05/29/24 at 1424  Med List Status: <None>   Medication Order Taking? Sig Documenting Provider Last Dose Status Informant  Acetaminophen  325 MG CAPS [id number] Yes Take 650 mg by mouth. [provider]  Active Self, Pharmacy Records  albuterol  (VENTOLIN  HFA) [phone] Base) MCG/ACT inhaler [id number] Yes Inhale 2 puffs into the lungs every 6 (six) hours as  needed for wheezing or shortness of breath. Georgina Speaks, FNP  Active Self, Pharmacy Records  allopurinol  (ZYLOPRIM ) 100 MG tablet [id number] Yes TAKE 1 TABLET BY MOUTH EVERY DAY [name], MD  Active Self, Pharmacy Records  aspirin EC 81 MG tablet [id number] Yes Take 1 tablet by mouth daily. [provider]  Active Self, Pharmacy Records  belatacept (NULOJIX) 250 MG SOLR injection [id number] Yes Inject into the vein. [provider]  Active   Cholecalciferol (VITAMIN D-3 PO) [id number] Yes Take 1 each by mouth daily at 12 noon. gummy [provider]  Active Self, Pharmacy Records  Continuous Glucose Sensor (DEXCOM G7 SENSOR) MISC [id number] Yes 1 % by Does not apply route continuous. 1 each, Every 14 days [provider]  Active Self, Pharmacy Records  furosemide  (LASIX ) 80 MG tablet [id number] Yes Take 80 mg by mouth 2 (two) times daily. [provider]  Active Self, Pharmacy Records  HYDROcodone -acetaminophen  (NORCO/VICODIN) 5-325 MG tablet [id number] Yes Take 1  tablet by mouth every 6 (six) hours as needed for moderate pain (pain score 4-6). Bethanie Cough, PA-C  Active   hydrocortisone  sodium succinate (SOLU-CORTEF ) 100 MG injection [id number] Yes Inject into the vein. [provider]  Active   Insulin  Disposable Pump (OMNIPOD 5 G7 PODS, GEN 5,) MISC [id number] Yes 1 each by Does not apply route every other day. Trixie File, MD  Active   Insulin  Lispro-aabc (LYUMJEV  KWIKPEN) 200 UNIT/ML KwikPen [id number] Yes Use up to 100 Units daily in the insulin  pump.   Active   Insulin  Lispro-aabc (LYUMJEV  KWIKPEN) 200 UNIT/ML KwikPen [id number] Yes Use up to 100 units a day in the insulin  pump. We need the U200 concentration.  DAW1. Trixie File, MD  Active Self, Pharmacy Records  Insulin  Pen Needle 32G X 4 MM MISC [id number] Yes Use 2x a day Trixie File, MD  Active Self, Pharmacy Records  Insulin  Syringe/Needle U-500 (BD INSULIN  SYRINGE  U-500) 31G X 0.5 ML MISC [id number] Yes Use to inject insulin  3 times a day Trixie File, MD  Active Self, Pharmacy Records  methenamine (HIPREX) 1 g tablet [id number] Yes Take 1 g by mouth daily. [provider]  Active Self, Pharmacy Records  methylPREDNISolone  sodium succinate (SOLU-MEDROL ) 2000 MG injection [id number] Yes Inject into the vein. [provider]  Active   metroNIDAZOLE  (FLAGYL ) 500 MG tablet [id number] Yes Take 1 tablet (500 mg total) by mouth 2 (two) times daily. Prentiss Annabella LABOR, NP  Active   Microlet Lancets MISC [id number] Yes Use as directed to check blood sugars 4 times per day dx: e11.22 [name], MD  Active Self, Pharmacy Records  mycophenolate (MYFORTIC) 180 MG EC tablet [id number] Yes Take 360 mg by mouth 2 (two) times daily. Patient is taking 2 tablets twice daily [provider]  Active Self, Pharmacy Records  NIFEdipine  (ADALAT  CC) 30 MG 24 hr tablet [id number] Yes Take by mouth. [provider]  Active   NIFEdipine  (PROCARDIA -XL/NIFEDICAL-XL) 30 MG 24 hr tablet [id number] Yes Take 2 tablets by mouth in the morning and one tablet by mouth In the evening. [name], MD  Active Self, Pharmacy Records  omeprazole  [facility]) 20 MG capsule [id number] Yes TAKE 1 CAPSULE BY MOUTH EVERY DAY Georgina Speaks, FNP  Active   predniSONE  (DELTASONE ) 5 MG tablet [id number] Yes Take 5 mg by mouth daily with breakfast. [provider]  Active Self, Pharmacy Records  pregabalin  (LYRICA ) 150 MG capsule [id number] Yes TAKE 1 CAPSULE BY MOUTH EVERY DAY [name], MD  Active   rosuvastatin  (CRESTOR ) 20 MG tablet [id number] Yes TAKE 1 TABLET BY MOUTH EVERY DAY Georgina Speaks, FNP  Active   Semaglutide , 2 MG/DOSE, (OZEMPIC , 2 MG/DOSE,) 8 MG/3ML SOPN [id number] Yes Inject 2 mg into the skin once a week. Trixie File, MD  Active   sulfamethoxazole -trimethoprim  (BACTRIM ) 400-80 MG tablet [id number] Yes Take 1 tablet by mouth 3 (three) times  a week. Monday, Wednesday, Friday [provider]  Active Self, Pharmacy Records  Med List Note Anthoney, Medici, MD 11/09/18 9088): Dialysis MWF            Recommendation:   Specialty provider follow-up   07/12/2024 Status: Sch   Time: 2:45 PM Length: 60  Visit Type: PROCEDURE [7878607] Copay: $35.00  Provider: Carilyn Prentice BRAVO, MD Department: [facility]   Follow Up Plan:   Telephone follow up appointment date/time  06/27/2024 Status: Sch   Time: 1:00 PM Length: 30  Visit Type: VBCI TELEPHONE CALL  30 [2502] Copay: $0.00  Provider: Morgan Cassandra CROME, RN Department: [facility]   Cassandra Morgan RN BSN CCM [facility]  [facility], [facility] Nurse Care Coordinator  Direct Dial : [phone] Website: [email or web address]

## 2024-05-31 NOTE — Patient Outreach (Unsigned)
 Complex Care Management   Visit Note  05/31/2024  Name:  Cassandra Campos MRN: [id number] DOB: [birth year]  Situation: Referral received for Complex Care Management related to {Criteria:32550} I obtained verbal consent from {CHL AMB Patient/Caregiver:28184}.  Visit completed with {CHL AMB Patient/Caregiver:28184}  {VISIT LOCATION:32553}  Background:   Past Medical History:  Diagnosis Date   Anemia associated with chronic renal failure    blood transfusion 01/01/22 in [place]   Arthritis    ASCUS of cervix with negative high risk HPV 06/2017   Back pain    hx - resolved per pt 03/22/24   Chest pain    hx - reolved per pt 03/22/24   CHF (congestive heart failure) (HCC)    CKD (chronic kidney disease), stage IV (HCC) no dialysis yet   nephrologist-  dr montie dakins-- scheduled next visit 09-08-2017 (lov 07-05-2017)   Constipation    hx   Diabetic retinopathy (HCC)    Dialysis patient    in past prior to transplant in 2023 - dialysis x 4 yrs   Edema, lower extremity    hx   Elevated transaminase level    Family history of breast cancer    Family history of ovarian cancer    Family history of stomach cancer    Fatty liver    FOLLOWED BY DR ABRAN   GERD (gastroesophageal reflux disease)    Hypertension    Idiopathic gout of multiple sites    09-05-2017 per pt stable   Insulin  dependent type 2 diabetes mellitus (HCC)    followed by dr vincente   Joint pain    occasional   Kidney transplant recipient 12/17/2021   [facility]   Mixed hyperlipidemia    OSA (obstructive sleep apnea)    per study 10-20-2015 mild osa w/ AHI 10.3/hr;  09-05-2017 per pt has not used cpap since 1 yr ago   Palpitations    hx   Peripheral neuropathy    feet   Pneumonia    x 1   Pure hypercholesterolemia 05/21/2021   S/P arteriovenous (AV) fistula creation 07-04-2015  w/ revision 02-14-2017   left braniocephilic   Shortness of breath    hx prior to transplant, no current problems with SOB    Sleep apnea    Trigger thumb, right thumb    Umbilical hernia    Vitamin D deficiency     Assessment: Patient Reported Symptoms:  Cognitive        Neurological      HEENT        Cardiovascular      Respiratory      Endocrine      Gastrointestinal        Genitourinary      Integumentary Integumentary Symptoms Reported: Skin changes Additional Integumentary Details: skin dryness Skin Management Strategies: Routine screening (may ask for a dermatology referral) Skin Self-Management Outcome: 4 (good)  Musculoskeletal Musculoskelatal Symptoms Reviewed: Other Other Musculoskeletal Symptoms: left hand pain Musculoskeletal Management Strategies: Routine screening Musculoskeletal Self-Management Outcome: 3 (uncertain) Musculoskeletal Comment: patient is awaiting a nerve conduction study to rule out carpal tunnel Falls in the past year?: No Number of falls in past year: 1 or less Was there an injury with Fall?: No Fall Risk Category Calculator: 0 Patient Fall Risk Level: Low Fall Risk Patient at Risk for Falls Due to: No Fall Risks Fall risk Follow up: Falls evaluation completed  Psychosocial Psychosocial Symptoms Reported: No symptoms reported   Major Change/Loss/Stressor/Fears (CP): Denies Quality  of Family Relationships: supportive, helpful, involved Do you feel physically threatened by others?: No    05/31/2024    PHQ2-9 Depression Screening   Amali Uhls interest or pleasure in doing things    Feeling down, depressed, or hopeless    PHQ-2 - Total Score    Trouble falling or staying asleep, or sleeping too much    Feeling tired or having Evely Gainey energy    Poor appetite or overeating     Feeling bad about yourself - or that you are a failure or have let yourself or your family down    Trouble concentrating on things, such as reading the newspaper or watching television    Moving or speaking so slowly that other people could have noticed.  Or the opposite - being so  fidgety or restless that you have been moving around a lot more than usual    Thoughts that you would be better off dead, or hurting yourself in some way    PHQ2-9 Total Score    If you checked off any problems, how difficult have these problems made it for you to do your work, take care of things at home, or get along with other people    Depression Interventions/Treatment      There were no vitals filed for this visit.    Medications Reviewed Today     Reviewed by Morgan Clayborne CROME, RN (Registered Nurse) on 05/29/24 at 1424  Med List Status: <None>   Medication Order Taking? Sig Documenting Provider Last Dose Status Informant  Acetaminophen  325 MG CAPS [id number] Yes Take 650 mg by mouth. [provider]  Active Self, Pharmacy Records  albuterol  (VENTOLIN  HFA) 108 [phone] Base) MCG/ACT inhaler [id number] Yes Inhale 2 puffs into the lungs every 6 (six) hours as needed for wheezing or shortness of breath. Georgina Speaks, FNP  Active Self, Pharmacy Records  allopurinol  (ZYLOPRIM ) 100 MG tablet [id number] Yes TAKE 1 TABLET BY MOUTH EVERY DAY [name], MD  Active Self, Pharmacy Records  aspirin EC 81 MG tablet [id number] Yes Take 1 tablet by mouth daily. [provider]  Active Self, Pharmacy Records  belatacept (NULOJIX) 250 MG SOLR injection [id number] Yes Inject into the vein. [provider]  Active   Cholecalciferol (VITAMIN D-3 PO) [id number] Yes Take 1 each by mouth daily at 12 noon. gummy [provider]  Active Self, Pharmacy Records  Continuous Glucose Sensor (DEXCOM G7 SENSOR) MISC [id number] Yes 1 % by Does not apply route continuous. 1 each, Every 14 days [provider]  Active Self, Pharmacy Records  furosemide  (LASIX ) 80 MG tablet [id number] Yes Take 80 mg by mouth 2 (two) times daily. [provider]  Active Self, Pharmacy Records  HYDROcodone -acetaminophen  (NORCO/VICODIN) 5-325 MG tablet [id number] Yes Take 1 tablet by mouth every 6  (six) hours as needed for moderate pain (pain score 4-6). Bethanie Cough, PA-C  Active   hydrocortisone  sodium succinate (SOLU-CORTEF ) 100 MG injection [id number] Yes Inject into the vein. [provider]  Active   Insulin  Disposable Pump (OMNIPOD 5 G7 PODS, GEN 5,) MISC [id number] Yes 1 each by Does not apply route every other day. Trixie File, MD  Active   Insulin  Lispro-aabc (LYUMJEV  [facility]) 200 UNIT/ML KwikPen [id number] Yes Use up to 100 Units daily in the insulin  pump.   Active   Insulin  Lispro-aabc (LYUMJEV  KWIKPEN) 200 UNIT/ML KwikPen [id number] Yes Use up to 100 units a day in the insulin  pump. We need the U200 concentration.  DAW1. Trixie,  Lela, MD  Active Self, Pharmacy Records  Insulin  Pen Needle 32G X 4 MM MISC [id number] Yes Use 2x a day Trixie Lela, MD  Active Self, Pharmacy Records  Insulin  Syringe/Needle U-500 (BD INSULIN  SYRINGE U-500) 31G X 0.5 ML MISC [id number] Yes Use to inject insulin  3 times a day Trixie Lela, MD  Active Self, Pharmacy Records  methenamine (HIPREX) 1 g tablet [id number] Yes Take 1 g by mouth daily. [provider]  Active Self, Pharmacy Records  methylPREDNISolone  sodium succinate (SOLU-MEDROL ) 2000 MG injection [id number] Yes Inject into the vein. [provider]  Active   metroNIDAZOLE  (FLAGYL ) 500 MG tablet [id number] Yes Take 1 tablet (500 mg total) by mouth 2 (two) times daily. Prentiss Annabella LABOR, NP  Active   Microlet Lancets MISC [id number] Yes Use as directed to check blood sugars 4 times per day dx: e11.22 [name], MD  Active Self, Pharmacy Records  mycophenolate (MYFORTIC) 180 MG EC tablet [id number] Yes Take 360 mg by mouth 2 (two) times daily. Patient is taking 2 tablets twice daily [provider]  Active Self, Pharmacy Records  NIFEdipine  (ADALAT  CC) 30 MG 24 hr tablet [id number] Yes Take by mouth. [provider]  Active   NIFEdipine  (PROCARDIA -XL/NIFEDICAL-XL) 30 MG 24 hr tablet  [id number] Yes Take 2 tablets by mouth in the morning and one tablet by mouth In the evening. [name], MD  Active Self, Pharmacy Records  omeprazole  [facility]) 20 MG capsule [id number] Yes TAKE 1 CAPSULE BY MOUTH EVERY DAY Georgina Speaks, FNP  Active   predniSONE  (DELTASONE ) 5 MG tablet [id number] Yes Take 5 mg by mouth daily with breakfast. [provider]  Active Self, Pharmacy Records  pregabalin  (LYRICA ) 150 MG capsule [id number] Yes TAKE 1 CAPSULE BY MOUTH EVERY DAY [name], MD  Active   rosuvastatin  (CRESTOR ) 20 MG tablet [id number] Yes TAKE 1 TABLET BY MOUTH EVERY DAY Georgina Speaks, FNP  Active   Semaglutide , 2 MG/DOSE, (OZEMPIC , 2 MG/DOSE,) 8 MG/3ML SOPN [id number] Yes Inject 2 mg into the skin once a week. Trixie Lela, MD  Active   sulfamethoxazole -trimethoprim  (BACTRIM ) 400-80 MG tablet [id number] Yes Take 1 tablet by mouth 3 (three) times a week. Monday, Wednesday, Friday [provider]  Active Self, Pharmacy Records  Med List Note Anthoney, Medici, MD 11/09/18 9088): Dialysis MWF            Recommendation:   {RECOMMENDATONS:32554}  Follow Up Plan:   {FOLLOWUP:32559}  SIG ***

## 2024-05-31 NOTE — Telephone Encounter (Signed)
 Pt's nerve study has been r/s for 07/12/24 due to the other office's machine not working at the moment. They have referred her to another office and she is on the wait list for a sooner appt, as she can't work at this time. She stated she has let her company/short term disability know and they will reach out if additional information is needed. No further questions/concerns at this time.

## 2024-05-31 NOTE — Patient Instructions (Signed)
 Visit Information  Thank you for taking time to visit with me today. Please don't hesitate to contact me if I can be of assistance to you before our next scheduled appointment.  Your next care management appointment is by telephone on Wednesday, February 11 at 1:00 PM  Please call the care guide team at [phone] if you need to cancel, schedule, or reschedule an appointment.   Please call [phone]-TALK (toll free, 24 hour hotline) if you are experiencing a Mental Health or Behavioral Health Crisis or need someone to talk to.  Clayborne Ly RN BSN CCM [place]  [facility], [facility] Nurse Care Coordinator  Direct Dial : [phone] Website: [email or web address]

## 2024-06-01 ENCOUNTER — Other Ambulatory Visit (HOSPITAL_COMMUNITY): Payer: Self-pay

## 2024-06-01 ENCOUNTER — Telehealth: Payer: Self-pay | Admitting: Pharmacist

## 2024-06-01 DIAGNOSIS — E1141 Type 2 diabetes mellitus with diabetic mononeuropathy: Secondary | ICD-10-CM

## 2024-06-01 DIAGNOSIS — E1169 Type 2 diabetes mellitus with other specified complication: Secondary | ICD-10-CM

## 2024-06-01 MED ORDER — EZETIMIBE 10 MG PO TABS
10.0000 mg | ORAL_TABLET | Freq: Every day | ORAL | 3 refills | Status: AC
  Filled 2024-06-01: qty 90, 90d supply, fill #0

## 2024-06-01 NOTE — Progress Notes (Unsigned)
 "  06/04/2024 Name: Cassandra Campos MRN: [id number] DOB: 09-05-1970  Chief Complaint  Patient presents with   Medication Management    Diabetes    Cassandra Campos is a 54 y.o. year old female who presented for a telephone visit.   They were referred to the pharmacist by their PCP for assistance in managing diabetes. She has multiple medical conditions including but not limited to:  type 2 diabetes, hypertension, kidney transplant, hyperlipidemia, sleep apnea, and obesity.  The purpose of today's call was to follow up on her blood sugars.  Care Team: Primary Care Provider: [name], MD   Medication Access/Adherence  Current Pharmacy:  [facility] [address], [place] - [address] [place] [place] [address] Phone: [phone] Fax: [phone]  [facility] - [place], [place] - [address] [place] [address] Phone: [phone] Fax: [phone]  [facility] [place] (New Address) - [place], [place] - [address] AT Previously: Viviana Mulligan, [place] [address] 4th Floor [address] [place] [address] Phone: [phone] Fax: [phone]  [place] - [facility] [address] [place] [address] Phone: [phone] Fax: [phone]   Patient reports affordability concerns with their medications: No  Patient reports access/transportation concerns to their pharmacy: No  Patient reports adherence concerns with their medications:  No      Diabetes:  Current medications:   Ozempic  2 mg weekly Lyumjev  ( Lispro-aabc) 100 units daily Omnipod (insulin  pump)     Patient denies hypoglycemic s/sx including  dizziness, shakiness, sweating. Patient denies hyperglycemic symptoms including  polyuria, polydipsia, polyphagia, nocturia, neuropathy, blurred vision.   Macrovascular and  Microvascular Risk Reduction:  Statin? yes (Rosuvastatin  20 mg daily, ); ACEi/ARB? No--therapy indicated Last urinary albumin/creatinine ratio:  Lab Results  Component Value Date   MICRALBCREAT 75 (H) 05/21/2024   MICRALBCREAT 32 (H) 05/26/2023   MICRALBCREAT 52 (H) 11/17/2022   MICRALBCREAT 300 08/25/2020   MICRALBCREAT 30 - 300 mg/g 08/07/2019   MICRALBCREAT 30-300 11/09/2018   Last eye exam:  Lab Results  Component Value Date   HMDIABEYEEXA No Retinopathy 01/19/2023   Last foot exam: 01/12/2024 Tobacco Use:  Tobacco Use: Medium Risk (05/29/2024)   Patient History    Smoking Tobacco Use: Never    Smokeless Tobacco Use: Never    Passive Exposure: Current   Hyperlipidemia/ASCVD Risk Reduction  Current lipid lowering medications:    Rosuvastatin  20 mg 1 tablet daily  Ezetimibe  10 mg 1 tablet daily   Objective:  Lab Results  Component Value Date   HGBA1C 6.9 (H) 05/21/2024    Lab Results  Component Value Date   CREATININE 2.80 (H) 03/26/2024   BUN 20 03/26/2024   NA 142 03/26/2024   K 3.0 (L) 03/26/2024   CL 106 03/26/2024   CO2 23 07/20/2023    Lab Results  Component Value Date   CHOL 196 05/21/2024   HDL 58 05/21/2024   LDLCALC 114 (H) 05/21/2024   TRIG 134 05/21/2024   CHOLHDL 3.4 05/21/2024    Medications Reviewed Today     Reviewed by Jolee Cassius PARAS, RPH (Pharmacist) on 06/04/24 at 1347  Med List Status: <None>   Medication Order Taking? Sig Documenting Provider Last Dose Status Informant  Acetaminophen  325 MG CAPS [id number] Yes Take 650 mg by mouth. [provider]  Active Self, Pharmacy Records  albuterol  (VENTOLIN  HFA) 108 (  90 Base) MCG/ACT inhaler [id number] Yes Inhale 2 puffs into the lungs every 6 (six) hours as needed for wheezing or shortness of breath. Georgina Speaks, FNP  Active Self, Pharmacy Records  allopurinol  (ZYLOPRIM ) 100 MG tablet [id number] Yes TAKE 1 TABLET BY MOUTH EVERY DAY [name], MD  Active Self, Pharmacy  Records  aspirin EC 81 MG tablet [id number] Yes Take 1 tablet by mouth daily. [provider]  Active Self, Pharmacy Records  belatacept (NULOJIX) 250 MG SOLR injection [id number] Yes Inject into the vein. [provider]  Active   Cholecalciferol (VITAMIN D-3 PO) [id number] Yes Take 1 each by mouth daily at 12 noon. gummy [provider]  Active Self, Pharmacy Records  Continuous Glucose Sensor (DEXCOM G7 SENSOR) MISC [id number] Yes 1 % by Does not apply route continuous. 1 each, Every 14 days [provider]  Active Self, Pharmacy Records  ezetimibe  (ZETIA ) 10 MG tablet [id number] Yes Take 1 tablet (10 mg total) by mouth daily. [name], MD  Active   furosemide  (LASIX ) 80 MG tablet [id number] Yes Take 80 mg by mouth 2 (two) times daily. [provider]  Active Self, Pharmacy Records  HYDROcodone -acetaminophen  (NORCO/VICODIN) 5-325 MG tablet [id number] Yes Take 1 tablet by mouth every 6 (six) hours as needed for moderate pain (pain score 4-6). Bethanie Cough, PA-C  Active   hydrocortisone  sodium succinate (SOLU-CORTEF ) 100 MG injection [id number] Yes Inject into the vein. [provider]  Active   Insulin  Disposable Pump (OMNIPOD 5 G7 PODS, GEN 5,) MISC [id number] Yes 1 each by Does not apply route every other day. Trixie File, MD  Active   Insulin  Lispro-aabc (LYUMJEV  [facility]) 200 UNIT/ML KwikPen [id number] Yes Use up to 100 Units daily in the insulin  pump.   Active   Insulin  Lispro-aabc (LYUMJEV  KWIKPEN) 200 UNIT/ML KwikPen [id number] Yes Use up to 100 units a day in the insulin  pump. We need the U200 concentration.  DAW1. Trixie File, MD  Active Self, Pharmacy Records  Insulin  Pen Needle 32G X 4 MM MISC [id number] Yes Use 2x a day Trixie File, MD  Active Self, Pharmacy Records  Insulin  Syringe/Needle U-500 (BD INSULIN  SYRINGE U-500) 31G X 0.5 ML MISC [id number] Yes Use to inject insulin  3 times a day Trixie File, MD  Active  Self, Pharmacy Records  methenamine (HIPREX) 1 g tablet [id number] Yes Take 1 g by mouth daily. [provider]  Active Self, Pharmacy Records  methylPREDNISolone  sodium succinate (SOLU-MEDROL ) 2000 MG injection [id number] Yes Inject into the vein. [provider]  Active   metroNIDAZOLE  (FLAGYL ) 500 MG tablet [id number] Yes Take 1 tablet (500 mg total) by mouth 2 (two) times daily. Prentiss Annabella LABOR, NP  Active   Microlet Lancets MISC [id number] Yes Use as directed to check blood sugars 4 times per day dx: e11.22 [name], MD  Active Self, Pharmacy Records  mycophenolate (MYFORTIC) 180 MG EC tablet [id number] Yes Take 360 mg by mouth 2 (two) times daily. Patient is taking 2 tablets twice daily [provider]  Active Self, Pharmacy Records  NIFEdipine  (ADALAT  CC) 30 MG 24 hr tablet [id number] Yes Take by mouth. [provider]  Active   NIFEdipine  (PROCARDIA -XL/NIFEDICAL-XL) 30 MG 24 hr tablet [id number] Yes Take 2 tablets by mouth in the morning and one tablet by mouth In the evening. [name], MD  Active Self, Pharmacy Records  omeprazole  [facility]) 20 MG capsule [id number] Yes TAKE 1 CAPSULE BY MOUTH EVERY DAY Georgina Speaks, FNP  Active  predniSONE  (DELTASONE ) 5 MG tablet [id number] Yes Take 5 mg by mouth daily with breakfast. [provider]  Active Self, Pharmacy Records  pregabalin  (LYRICA ) 150 MG capsule [id number] Yes TAKE 1 CAPSULE BY MOUTH EVERY DAY [name], MD  Active   rosuvastatin  (CRESTOR ) 20 MG tablet [id number] Yes TAKE 1 TABLET BY MOUTH EVERY DAY Georgina Speaks, FNP  Active   Semaglutide , 2 MG/DOSE, (OZEMPIC , 2 MG/DOSE,) 8 MG/3ML SOPN [id number] Yes Inject 2 mg into the skin once a week. Trixie File, MD  Active   sulfamethoxazole -trimethoprim  (BACTRIM ) 400-80 MG tablet [id number] Yes Take 1 tablet by mouth 3 (three) times a week. Monday, Wednesday, Friday [provider]  Active Self, Pharmacy Records  Med List Note  Anthoney, Medici, MD 11/09/18 0911): Dialysis MWF              05/29/2024    1:23 PM 05/21/2024   11:58 AM 05/02/2024   10:56 AM  Vitals with BMI  Height 5' 2 5' 2   Weight 225 lbs 6 oz 224 lbs 13 oz   BMI 41.22 41.11   Systolic 122 132 879  Diastolic 70 70 68  Pulse 100 98 70      Assessment/Plan:   Diabetes: - Currently controlled; goal A1c <7%. Cardiorenal risk reduction is opportunities for improvement.. Blood pressure is at goal <130/80. LDL is not at goal.  -Continue current therapy   Follow Up Plan:    Gave new coupon for Ozempic .   Upcoming PCP appointment:  07/26 Follow up in 3 months.   Cassius DOROTHA Brought, PharmD, BCACP Clinical Pharmacist [phone]    "

## 2024-06-07 ENCOUNTER — Encounter (INDEPENDENT_AMBULATORY_CARE_PROVIDER_SITE_OTHER): Payer: Self-pay

## 2024-06-11 ENCOUNTER — Encounter: Payer: Self-pay | Admitting: Nurse Practitioner

## 2024-06-15 ENCOUNTER — Ambulatory Visit: Admitting: Physical Medicine and Rehabilitation

## 2024-06-15 DIAGNOSIS — M79642 Pain in left hand: Secondary | ICD-10-CM | POA: Diagnosis not present

## 2024-06-15 DIAGNOSIS — R29898 Other symptoms and signs involving the musculoskeletal system: Secondary | ICD-10-CM | POA: Diagnosis not present

## 2024-06-15 DIAGNOSIS — R202 Paresthesia of skin: Secondary | ICD-10-CM | POA: Diagnosis not present

## 2024-06-15 NOTE — Progress Notes (Unsigned)
 Pain Scale   Average Pain 0 Patient advising when she is driving and working her left hand goes numb and she has to shake her hand to stop the tingling. Patient is right hand dominate.        +Driver, -BT, -Dye Allergies.

## 2024-06-20 NOTE — Procedures (Unsigned)
 EMG & NCV Findings: Evaluation of the left median motor nerve showed prolonged distal onset latency (5.9 ms) and decreased conduction velocity (Elbow-Wrist, 41 m/s).  The left median (across palm) sensory nerve showed no response (Palm), prolonged distal peak latency (6.3 ms), and reduced amplitude (9.4 V).  All remaining nerves (as indicated in the following tables) were within normal limits.    All examined muscles (as indicated in the following table) showed no evidence of electrical instability.    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   There is no significant electrodiagnostic evidence of any other focal nerve entrapment, brachial plexopathy or cervical radiculopathy.   Recommendations: 1.  Follow-up with referring physician. 2.  Continue current management of symptoms. 3.  Continue use of resting splint at night-time and as needed during the day. 4.  Suggest surgical evaluation.  ___________________________ Prentice Masters FAAPMR Board Certified, American Board of Physical Medicine and Rehabilitation    Nerve Conduction Studies Anti Sensory Summary Table   Stim Site NR Peak (ms) Norm Peak (ms) P-T Amp (V) Norm P-T Amp Site1 Site2 Delta-P (ms) Dist (cm) Vel (m/s) Norm Vel (m/s)  Left Median Acr Palm Anti Sensory (2nd Digit)  30C  Wrist    *6.3 <3.6 *9.4 >10 Wrist Palm  0.0    Palm *NR  <2.0          Left Radial Anti Sensory (Base 1st Digit)  29.1C  Wrist    2.8 <3.1 6.0  Wrist Base 1st Digit 2.8 0.0    Left Ulnar Anti Sensory (5th Digit)  30.1C  Wrist    3.7 <3.7 16.2 >15.0 Wrist 5th Digit 3.7 14.0 38 >38   Motor Summary Table   Stim Site NR Onset (ms) Norm Onset (ms) O-P Amp (mV) Norm O-P Amp Site1 Site2 Delta-0 (ms) Dist (cm) Vel (m/s) Norm Vel (m/s)  Left Median Motor (Abd Poll Brev)  29.4C  Wrist     *5.9 <4.2 7.0 >5 Elbow Wrist 4.8 19.5 *41 >50  Elbow    10.7  8.1         Left Ulnar Motor (Abd Dig Min)  29.5C  Wrist    4.0 <4.2 7.3 >3 B Elbow Wrist 3.6 19.0 53 >53  B Elbow    7.6  5.9  A Elbow B Elbow 1.6 11.0 69 >53  A Elbow    9.2  6.5          EMG   Side Muscle Nerve Root Ins Act Fibs Psw Amp Dur Poly Recrt Int Bruna Comment  Left Abd Poll Brev Median C8-T1 Nml Nml Nml Nml Nml 0 Nml Nml   Left 1stDorInt Ulnar C8-T1 Nml Nml Nml Nml Nml 0 Nml Nml   Left PronatorTeres Median C6-7 Nml Nml Nml Nml Nml 0 Nml Nml   Left Biceps Musculocut C5-6 Nml Nml Nml Nml Nml 0 Nml Nml   Left Deltoid Axillary C5-6 Nml Nml Nml Nml Nml 0 Nml Nml     Nerve Conduction Studies Anti Sensory Left/Right Comparison   Stim Site L Lat (ms) R Lat (ms) L-R Lat (ms) L Amp (V) R Amp (V) L-R Amp (%) Site1 Site2 L Vel (m/s) R Vel (m/s) L-R Vel (m/s)  Median Acr Palm Anti Sensory (2nd Digit)  30C  Wrist *6.3   *9.4   Wrist Palm  Palm             Radial Anti Sensory (Base 1st Digit)  29.1C  Wrist 2.8   6.0   Wrist Base 1st Digit     Ulnar Anti Sensory (5th Digit)  30.1C  Wrist 3.7   16.2   Wrist 5th Digit 38     Motor Left/Right Comparison   Stim Site L Lat (ms) R Lat (ms) L-R Lat (ms) L Amp (mV) R Amp (mV) L-R Amp (%) Site1 Site2 L Vel (m/s) R Vel (m/s) L-R Vel (m/s)  Median Motor (Abd Poll Brev)  29.4C  Wrist *5.9   7.0   Elbow Wrist *41    Elbow 10.7   8.1         Ulnar Motor (Abd Dig Min)  29.5C  Wrist 4.0   7.3   B Elbow Wrist 53    B Elbow 7.6   5.9   A Elbow B Elbow 69    A Elbow 9.2   6.5            Waveforms:

## 2024-06-21 ENCOUNTER — Encounter: Payer: Self-pay | Admitting: Physical Medicine and Rehabilitation

## 2024-06-21 DIAGNOSIS — M25579 Pain in unspecified ankle and joints of unspecified foot: Secondary | ICD-10-CM | POA: Insufficient documentation
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
## 2024-06-26 ENCOUNTER — Ambulatory Visit: Admitting: Orthopedic Surgery

## 2024-06-27 ENCOUNTER — Telehealth

## 2024-07-12 ENCOUNTER — Encounter: Admitting: Physical Medicine & Rehabilitation

## 2024-09-25 ENCOUNTER — Ambulatory Visit: Admitting: Internal Medicine

## 2024-11-19 ENCOUNTER — Ambulatory Visit: Payer: Self-pay | Admitting: Internal Medicine

## 2025-02-25 ENCOUNTER — Ambulatory Visit: Admitting: Nurse Practitioner

## 2025-05-23 ENCOUNTER — Encounter: Payer: Self-pay | Admitting: Internal Medicine
